# Patient Record
Sex: Male | Born: 1970
Health system: Southern US, Community
[De-identification: ages and names within clinical notes are randomized; demographics above are authoritative.]

## PROBLEM LIST (undated history)

## (undated) DIAGNOSIS — M539 Dorsopathy, unspecified: Secondary | ICD-10-CM

## (undated) DIAGNOSIS — R0989 Other specified symptoms and signs involving the circulatory and respiratory systems: Secondary | ICD-10-CM

## (undated) DIAGNOSIS — F419 Anxiety disorder, unspecified: Secondary | ICD-10-CM

## (undated) DIAGNOSIS — R0602 Shortness of breath: Secondary | ICD-10-CM

## (undated) DIAGNOSIS — R569 Unspecified convulsions: Secondary | ICD-10-CM

## (undated) DIAGNOSIS — I10 Essential (primary) hypertension: Secondary | ICD-10-CM

## (undated) DIAGNOSIS — T8859XA Other complications of anesthesia, initial encounter: Secondary | ICD-10-CM

## (undated) DIAGNOSIS — G43909 Migraine, unspecified, not intractable, without status migrainosus: Secondary | ICD-10-CM

## (undated) DIAGNOSIS — F332 Major depressive disorder, recurrent severe without psychotic features: Secondary | ICD-10-CM

## (undated) DIAGNOSIS — M47812 Spondylosis without myelopathy or radiculopathy, cervical region: Secondary | ICD-10-CM

## (undated) DIAGNOSIS — T4145XA Adverse effect of unspecified anesthetic, initial encounter: Secondary | ICD-10-CM

## (undated) DIAGNOSIS — Z9049 Acquired absence of other specified parts of digestive tract: Secondary | ICD-10-CM

## (undated) DIAGNOSIS — F32A Depression, unspecified: Secondary | ICD-10-CM

## (undated) DIAGNOSIS — F329 Major depressive disorder, single episode, unspecified: Secondary | ICD-10-CM

## (undated) DIAGNOSIS — K219 Gastro-esophageal reflux disease without esophagitis: Secondary | ICD-10-CM

## (undated) HISTORY — DX: Major depressive disorder, recurrent severe without psychotic features: F33.2

## (undated) HISTORY — DX: Migraine, unspecified, not intractable, without status migrainosus: G43.909

## (undated) HISTORY — DX: Spondylosis without myelopathy or radiculopathy, cervical region: M47.812

## (undated) HISTORY — DX: Gastro-esophageal reflux disease without esophagitis: K21.9

## (undated) HISTORY — DX: Essential (primary) hypertension: I10

## (undated) HISTORY — DX: Shortness of breath: R06.02

## (undated) HISTORY — PX: POSTERIOR FUSION CERVICAL SPINE: SUR628

## (undated) HISTORY — DX: Depression, unspecified: F32.A

## (undated) HISTORY — DX: Other specified symptoms and signs involving the circulatory and respiratory systems: R09.89

## (undated) HISTORY — DX: Unspecified convulsions: R56.9

## (undated) HISTORY — PX: CERVICAL DISC SURGERY: SHX588

## (undated) HISTORY — DX: Acquired absence of other specified parts of digestive tract: Z90.49

## (undated) HISTORY — DX: Anxiety disorder, unspecified: F41.9

## (undated) HISTORY — DX: Dorsopathy, unspecified: M53.9

## (undated) HISTORY — DX: Major depressive disorder, single episode, unspecified: F32.9

## (undated) HISTORY — PX: KNEE SURGERY: SHX244

---

## 1998-03-30 ENCOUNTER — Emergency Department (HOSPITAL_COMMUNITY): Admission: EM | Admit: 1998-03-30 | Discharge: 1998-03-30 | Payer: Self-pay | Admitting: Emergency Medicine

## 2004-05-02 ENCOUNTER — Other Ambulatory Visit (HOSPITAL_COMMUNITY): Admission: RE | Admit: 2004-05-02 | Discharge: 2004-07-31 | Payer: Self-pay | Admitting: Psychiatry

## 2004-05-02 ENCOUNTER — Ambulatory Visit: Payer: Self-pay | Admitting: Psychiatry

## 2005-09-12 ENCOUNTER — Ambulatory Visit (HOSPITAL_COMMUNITY): Payer: Self-pay | Admitting: Psychiatry

## 2005-09-13 ENCOUNTER — Ambulatory Visit: Payer: Self-pay | Admitting: Psychiatry

## 2005-09-13 ENCOUNTER — Other Ambulatory Visit (HOSPITAL_COMMUNITY): Admission: RE | Admit: 2005-09-13 | Discharge: 2005-10-13 | Payer: Self-pay | Admitting: Psychiatry

## 2007-11-28 ENCOUNTER — Encounter: Payer: Self-pay | Admitting: Cardiovascular Disease

## 2008-01-16 ENCOUNTER — Emergency Department: Payer: Self-pay | Admitting: Emergency Medicine

## 2008-01-16 ENCOUNTER — Emergency Department (HOSPITAL_COMMUNITY): Admission: EM | Admit: 2008-01-16 | Discharge: 2008-01-17 | Payer: Self-pay | Admitting: Emergency Medicine

## 2009-01-20 ENCOUNTER — Emergency Department (HOSPITAL_COMMUNITY): Admission: EM | Admit: 2009-01-20 | Discharge: 2009-01-20 | Payer: Self-pay | Admitting: Emergency Medicine

## 2009-02-03 ENCOUNTER — Emergency Department (HOSPITAL_COMMUNITY): Admission: EM | Admit: 2009-02-03 | Discharge: 2009-02-04 | Payer: Self-pay | Admitting: Emergency Medicine

## 2009-06-01 ENCOUNTER — Ambulatory Visit (HOSPITAL_BASED_OUTPATIENT_CLINIC_OR_DEPARTMENT_OTHER): Admission: RE | Admit: 2009-06-01 | Discharge: 2009-06-01 | Payer: Self-pay | Admitting: Nurse Practitioner

## 2009-06-04 ENCOUNTER — Ambulatory Visit: Payer: Self-pay | Admitting: Internal Medicine

## 2009-06-07 ENCOUNTER — Ambulatory Visit (HOSPITAL_COMMUNITY): Admission: RE | Admit: 2009-06-07 | Discharge: 2009-06-09 | Payer: Self-pay | Admitting: Neurosurgery

## 2009-06-27 ENCOUNTER — Encounter: Admission: RE | Admit: 2009-06-27 | Discharge: 2009-06-27 | Payer: Self-pay | Admitting: Neurosurgery

## 2009-08-21 ENCOUNTER — Ambulatory Visit (HOSPITAL_BASED_OUTPATIENT_CLINIC_OR_DEPARTMENT_OTHER): Admission: RE | Admit: 2009-08-21 | Discharge: 2009-08-21 | Payer: Self-pay | Admitting: Nurse Practitioner

## 2009-08-27 ENCOUNTER — Ambulatory Visit: Payer: Self-pay | Admitting: Internal Medicine

## 2010-01-05 ENCOUNTER — Encounter: Admission: RE | Admit: 2010-01-05 | Discharge: 2010-01-05 | Payer: Self-pay | Admitting: Internal Medicine

## 2010-01-12 ENCOUNTER — Encounter: Admission: RE | Admit: 2010-01-12 | Discharge: 2010-01-12 | Payer: Self-pay | Admitting: Internal Medicine

## 2010-02-14 ENCOUNTER — Encounter: Admission: RE | Admit: 2010-02-14 | Discharge: 2010-02-14 | Payer: Self-pay | Admitting: Neurosurgery

## 2010-03-23 ENCOUNTER — Ambulatory Visit: Payer: Self-pay | Admitting: Cardiovascular Disease

## 2010-03-23 ENCOUNTER — Encounter: Payer: Self-pay | Admitting: Cardiovascular Disease

## 2010-03-23 DIAGNOSIS — R079 Chest pain, unspecified: Secondary | ICD-10-CM | POA: Insufficient documentation

## 2010-04-02 HISTORY — PX: APPENDECTOMY: SHX54

## 2010-04-22 ENCOUNTER — Encounter: Payer: Self-pay | Admitting: Internal Medicine

## 2010-05-04 NOTE — Assessment & Plan Note (Signed)
Summary: NP6/AMD   Visit Type:  Initial Consult Primary Provider:  Vernia Buff.  CC:  c/o chest pain, shortness of breath, and feeling lightheaded and has numbness in left hand but thinks numbness is due to an accident.  Has shortness of breath with & without exertion daily.  He does not have chest pain daily.Thomas Cole  History of Present Illness: Thomas Cole is a very pleasant 40 year old gentleman with a long history of shortness of breath episodes, chest pain episodes with workup at outside facilities including [facility] including a stress echo, sleep studies, EKGs who presents after a recent visit to urgent care for similar symptoms. He has a long history of neck pathology, status post neck fusion from C4-C6 in March of 2011.  He reports having episodes of shortness of breath that sometimes come on with exertion and sometimes at rest. He noticed these symptoms back when he was working for the [employer] in 2009. He had a recent episode of shortness of breath with some chest discomfort while sitting on the couch. He is still able to exert himself and has done well on treadmills in the past. He is uncertain if his heart rate sinus tachycardia which has been noted during his visit to the urgent care and on previous evaluations is from pain or some other etiology.  He describes the chest pain as a heavy feeling, sometimes coming on with exertion. It happens sometimes once a week, sometimes less than that.  Stress echo in August 2009 was essentially normal with mild LVH, trivial MR and PR.  Notes indicate he does have obstructive sleep apnea and was recommended to wear CPAP but he does not wear this  EKG from March 13 2010 shows sinus tachycardia with rate of 138 beats per minute  EKG today shows normal sinus rhythm with rate 81 beats per minute, no significant ST or T wave changes  Preventive Screening-Counseling & Management  Caffeine-Diet-Exercise     Does Patient Exercise: yes      Drug  Use:  no.    Current Medications (verified): 1)  Dilantin 100 Mg Caps (Phenytoin Sodium Extended) 2)  Omeprazole 20 Mg Cpdr (Omeprazole) .... Takes Two Tablets Daily 3)  Lotensin 10 Mg Tabs (Benazepril Hcl)  Allergies (verified): 1)  ! Tegretol 2)  ! * Sulfar 3)  ! * Bee Stings  Past History:  Past Medical History: Last updated: 03/23/2010 Hypertension Migraine Headaches asthma back trouble anxiety GERD  Family History: Last updated: 03/23/2010 Father: CAD Mother: CAD, Hypertension  Social History: Last updated: 03/23/2010 Tobacco Use - No.  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  Risk Factors: Exercise: yes (03/23/2010)  Risk Factors: Smoking Status: never (03/23/2010)  Past Surgical History: bilateral knee surgery lower back surgery (ruptured disc) neck surgery (fused 4,5 & 6) June 07, 2009  Social History: Tobacco Use - No.  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 Drug Use:  no Does Patient Exercise:  yes  Review of Systems       The patient complains of chest pain and dyspnea on exertion.  The patient denies fever, weight loss, weight gain, vision loss,  decreased hearing, hoarseness, syncope, peripheral edema, prolonged cough, abdominal pain, incontinence, muscle weakness, depression, and enlarged lymph nodes.    Vital Signs:  Patient profile:   40 year old male Height:      74 inches Weight:      270 pounds BMI:     34.79 Pulse rate:   81 / minute BP sitting:   120 / 86  (left arm) Cuff size:   large  Vitals Entered By: Lysbeth Galas CMA (March 23, 2010 11:06 AM)  Physical Exam  General:  Well developed, well nourished, in no acute  distress. Head:  normocephalic and atraumatic Neck:  Neck supple, no JVD. No masses, thyromegaly or abnormal cervical nodes. Lungs:  Clear bilaterally to auscultation and percussion. Heart:  Non-displaced PMI, chest non-tender; regular rate and rhythm, S1, S2 without murmurs, rubs or gallops. Carotid upstroke normal, no bruit.  Pedals normal pulses. No edema, no varicosities. Abdomen:  Bowel sounds positive; abdomen soft and non-tender without masses,  Msk:  Back normal, normal gait. Muscle strength and tone normal. Pulses:  pulses normal in all 4 extremities Extremities:  No clubbing or cyanosis. Neurologic:  Alert and oriented x 3. Skin:  Intact without lesions or rashes. Psych:  Normal affect.   Impression & Recommendations:  Problem # 1:  CHEST PAIN (ICD-786.50) etiology of his chest pain and shortness of breath is uncertain.  I asked him to monitor his heart rate when he has these episodes. He may have an underlying atrial tachycardia causing his shortness of breath We have given him metoprolol tartrate to be taken p.r.n. for tachycardia  He does mention having some asthma symptoms. We have given him an albuterol inhaler to be taken p.r.n. for shortness of breath episodes. 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  We will also change his blood pressure pill 2 amlodipine 5 mg daily in an effort to manage his symptoms if they are from coronary spasm or esophageal spasm. We will also give him nitroglycerin sublingual to take p.r.n. for chest pain that is stabbing.  We discussed with him that no further stress testing is likely needed given his negative stress test in the past. We could consider a CT scan of the hardware of the chest if his symptoms persist. We could also try ranexa   The following medications were removed from the medication list:    Lotensin 10 Mg Tabs (Benazepril hcl) His updated medication list for this problem includes:     Nitrostat 0.4 Mg Subl (Nitroglycerin) .Thomas Cole... 1 tablet under tongue at onset of chest pain; you may repeat every 5 minutes for up to 3 doses.    Metoprolol Tartrate 25 Mg Tabs (Metoprolol tartrate) .Thomas Cole... Take one tablet by mouth twice a day as needed for tachycardia    Amlodipine Besylate 10 Mg Tabs (Amlodipine besylate) .Thomas Cole... Take one half tablet by mouth daily  Other Orders: EKG w/ Interpretation (93000)  Patient Instructions: 1)  Your physician recommends that you schedule a follow-up appointment in: 2 months 2)  Your physician has recommended you make the following change in your medication: STOP Benazepril. START Amlodipine 10mg  1/2 tablet once daily. START Nitroglycerin 0.4mg  SL as needed. START Albuterol as needed for shortness of breath. START Metoprolol Tartrate 25mg  once daily for fast heart beat. Prescriptions: AMLODIPINE BESYLATE 10 MG TABS (AMLODIPINE BESYLATE) Take one half tablet by mouth daily  #30 x 6   Entered by:   Lanny Hurst RN  Authorized by:   Dossie Arbour MD   Signed by:   Lanny Hurst RN on 03/23/2010   Method used:   Electronically to        [id number]       [address]       [place], [place]  [address]       Ph: [phone]       Fax: [phone]   RxID:   [id number] METOPROLOL TARTRATE 25 MG TABS (METOPROLOL TARTRATE) Take one tablet by mouth twice a day as needed for tachycardia  #60 x 1   Entered by:   Lanny Hurst RN   Authorized by:   Dossie Arbour MD   Signed by:   Lanny Hurst RN on 03/23/2010   Method used:   Electronically to        [id number] ([facility]       [address]       [place], [place]  [address]       Ph: [phone]       Fax: [phone]   RxID:   [id number] ALBUTEROL SULFATE (2.5 MG/3ML) 0.083% NEBU (ALBUTEROL SULFATE) Inhale 2 puffs every 4-6 hours as needed for shortness of breath  #1 x 0   Entered by:   Lanny Hurst RN   Authorized by:   Dossie Arbour MD   Signed by:   Lanny Hurst RN  on 03/23/2010   Method used:   Electronically to        [id number] ([facility]       [address]       [place], [place]  [address]       Ph: [phone]       Fax: [phone]   RxID:   [id number] NITROSTAT 0.4 MG SUBL (NITROGLYCERIN) 1 tablet under tongue at onset of chest pain; you may repeat every 5 minutes for up to 3 doses.  #25 x 0   Entered by:   Lanny Hurst RN   Authorized by:   Dossie Arbour MD   Signed by:   Lanny Hurst RN on 03/23/2010   Method used:   Electronically to        [id number] ([facility])       [address]       [place], [place]  [address]       Ph: [phone]       Fax: [phone]   RxID:   [id number]

## 2010-05-25 ENCOUNTER — Ambulatory Visit (INDEPENDENT_AMBULATORY_CARE_PROVIDER_SITE_OTHER): Payer: 59 | Admitting: Cardiovascular Disease

## 2010-05-25 ENCOUNTER — Encounter: Payer: Self-pay | Admitting: Cardiovascular Disease

## 2010-05-25 DIAGNOSIS — G4730 Sleep apnea, unspecified: Secondary | ICD-10-CM | POA: Insufficient documentation

## 2010-05-25 DIAGNOSIS — R0602 Shortness of breath: Secondary | ICD-10-CM

## 2010-05-25 DIAGNOSIS — I10 Essential (primary) hypertension: Secondary | ICD-10-CM

## 2010-05-25 DIAGNOSIS — G4733 Obstructive sleep apnea (adult) (pediatric): Secondary | ICD-10-CM

## 2010-05-30 NOTE — Assessment & Plan Note (Signed)
Summary: F/U 2 MONTHS/SAB   Visit Type:  Follow-up Primary Provider:  Vernia Buff.  CC:  2 month f/u. c/o SOB and occasional chest pains. 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  On his last clinic visit, we tried improving his blood pressure with the addition of amlodipine. We gave him an inhaler and metoprolol for possible tachycardia though he has not tried the latter very often. He continues to have shortness of breath with exertion. He feels his heart rate may be going too fast when he exerts himself.   He did try CPAP though return it to the company as he does have a problem with sleep walking and took himself off CPAP every night.  Stress echo in August 2009 was essentially normal with mild LVH, trivial MR and PR.  EKG today shows normal sinus rhythm with rate 76 beats per minute with no significant ST or T wave changes  Current Medications (verified): 1)  Dilantin 100 Mg Caps (Phenytoin Sodium Extended) 2)  Omeprazole 20 Mg Cpdr (Omeprazole) .... Takes Two Tablets Daily 3)  Nitrostat 0.4 Mg Subl (Nitroglycerin) .Marland Kitchen.. 1 Tablet Under Tongue At Onset of Chest Pain; You May Repeat Every 5 Minutes For Up To 3 Doses. 4)  Albuterol Sulfate (2.5 Mg/70ml) 0.083% Nebu (Albuterol Sulfate) .... Inhale 2 Puffs Every 4-6 Hours As Needed For Shortness of Breath 5)  Metoprolol Tartrate 25 Mg Tabs (Metoprolol Tartrate) .... Take One Tablet By Mouth Twice A Day As Needed For  Tachycardia 6)  Amlodipine Besylate 10 Mg Tabs (Amlodipine Besylate) .... Take One Half Tablet By Mouth Daily  Allergies (verified): 1)  ! Tegretol 2)  ! * Sulfar 3)  ! * Bee Stings  Past History:  Past Medical History: Last updated: 03/23/2010 Hypertension Migraine Headaches asthma back trouble anxiety GERD  Past Surgical History: Last updated: 03/23/2010 bilateral knee surgery lower back surgery (ruptured disc) neck surgery (fused 4,5 & 6) June 07, 2009  Family History: Last updated: 03/23/2010 Father: CAD Mother: CAD, Hypertension  Social History: Last updated: 03/23/2010 Tobacco Use - No.  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  Risk Factors: Exercise: yes (03/23/2010)  Risk Factors: Smoking Status: never (03/23/2010)  Review of Systems       The patient complains of chest pain and dyspnea on exertion.  The patient denies fever, weight loss, weight gain, vision loss, decreased hearing, hoarseness, syncope, peripheral edema, prolonged cough, abdominal pain, incontinence, muscle weakness, depression, and enlarged lymph nodes.    Vital Signs:  Patient profile:   40 year old male Height:      74 inches Weight:      271 pounds BMI:     34.92 Pulse rate:   76 / minute BP sitting:   140 / 102  (left arm) Cuff size:   large  Vitals Entered By: Huntley Dec  Burress CMA (May 25, 2010 10:12 AM)  Physical Exam  General:  Well developed, well nourished, in no acute distress. Head:  normocephalic and atraumatic Neck:  Neck supple, no JVD. No masses, thyromegaly or abnormal cervical nodes. Lungs:  Clear bilaterally to auscultation and percussion. Heart:  Non-displaced PMI, chest non-tender; regular rate and rhythm, S1, S2 without murmurs, rubs or gallops. Carotid upstroke normal, no bruit.  Pedals normal pulses. No  edema, no varicosities. Abdomen:  Bowel sounds positive; abdomen soft and non-tender without masses,  Msk:  Back normal, normal gait. Muscle strength and tone normal. Pulses:  pulses normal in all 4 extremities Extremities:  No clubbing or cyanosis. Neurologic:  Alert and oriented x 3. Skin:  Intact without lesions or rashes. Psych:  Normal affect.   Impression & Recommendations:  Problem # 1:  DYSPNEA (ICD-786.05) he continues to have symptoms of shortness of breath with exertion. He has had very thorough workup, most at [facility]. Stress test, echocardiogram/stress echo, PFTs, cardiopulmonary testing with the VO2 measurements, by report of these reports are not available to [place]. Everything was reportedly normal.  He is not wearing his CPAP and he does have significant sleep walking and daytime somnolence. We have asked him to meet with a sleep specialist for further management.  Wrist just he tried his metoprolol tartrate 25 mg b.i.d. for heart rate control and for improved for pressure control. I've asked him to monitor his blood pressure at home and contact our office if his diastolic continues to be greater than 90. We could change him to amlodipine 10 mg daily for blood pressure control if needed His updated medication list for this problem includes:    Metoprolol Tartrate 25 Mg Tabs (Metoprolol tartrate) .Marland Kitchen... Take one tablet by mouth twice a day    Amlodipine Besylate 10 Mg Tabs (Amlodipine besylate) .Marland Kitchen... Take one half tablet by mouth daily  Orders: EKG w/ Interpretation (93000)  Problem # 2:  CHEST PAIN (ICD-786.50) mild symptoms. Previous negative stress test.  His updated medication list for this problem includes:    Nitrostat 0.4 Mg Subl (Nitroglycerin) .Marland Kitchen... 1 tablet under tongue at onset of chest pain; you may repeat every 5 minutes for up to 3 doses.    Metoprolol Tartrate 25 Mg Tabs (Metoprolol tartrate) .Marland Kitchen... Take one tablet by mouth twice a day    Amlodipine  Besylate 10 Mg Tabs (Amlodipine besylate) .Marland Kitchen... Take one half tablet by mouth daily  Orders: EKG w/ Interpretation (93000)  Problem # 3:  SLEEP APNEA (ICD-780.57) This needs addressing. He needs to wear his CPAP and meet with a sleep specialist. We have suggested he talk with a [facility] affiliated sleep physician. If he has trouble tolerating CPAP because of sleepwalking, he may require surgical treatment.  Patient Instructions: 1)  Your physician recommends that you schedule a follow-up appointment in: as needed 2)  Your physician has recommended you make the following change in your medication: Start on Metoprolol Tart taking 25 mg two times a day. 3)  Monitor blood pressure & heart rate for two weeks and send [place] the readings to the office.

## 2010-06-01 ENCOUNTER — Encounter (INDEPENDENT_AMBULATORY_CARE_PROVIDER_SITE_OTHER): Payer: 59

## 2010-06-01 ENCOUNTER — Telehealth: Payer: Self-pay | Admitting: Cardiovascular Disease

## 2010-06-01 ENCOUNTER — Encounter: Payer: Self-pay | Admitting: Cardiovascular Disease

## 2010-06-01 DIAGNOSIS — R0602 Shortness of breath: Secondary | ICD-10-CM

## 2010-06-01 DIAGNOSIS — I10 Essential (primary) hypertension: Secondary | ICD-10-CM

## 2010-06-08 NOTE — Assessment & Plan Note (Addendum)
Summary: SOB/Diaphoretic/Elev BP /MES  Nurse Visit   Vital Signs:  Patient profile:   40 year old male Height:      74 inches Weight:      271 pounds BMI:     34.92 BP sitting:   130 / 92  (left arm) Cuff size:   large  Vitals Entered By: Bishop Dublin, CMA (June 01, 2010 2:02 PM) CC: c/o elevated blood pressure & shortness of breath. Comments Pt in for BP check and EKG. EKG shows NSR HR 81. Pt is not symptomatic right now, however, he c/o incr SOB, dizziness, diaphoresis without exertion. Pt's BP has continuously been elevated with his "lowest readings of 140/90 and highest averaging 150/110." In previous office note Dr. Mariah Milling mentioned possibly increasing Amlodipine to 10mg  once daily, pt is now taking 5mg  once daily.    Pt is concerned with his symptoms of SOB. He takes Albuterol as needed, and pt states this has not improved symptoms at all. He wore CPAP in past but took it off during sleep, he has appt this month 06/20/10 with [facility] for assessment.    Pt c/o chest pressure at times but it does not last very long and he has never taken NTG for this. Pt has had spinal fusion of C3-6 about 1 year ago and wonders if this could be a possible reason causing symptoms. Notified pt Dr. Mariah Milling will assess BP and need for any med changes and we will contact him for any addional testing needed for his symptoms.     Visit Type:  nurse visit  CC:  c/o elevated blood pressure & shortness of breath..   Past History:  Past Medical History: Last updated: 03/23/2010 Hypertension Migraine Headaches asthma back trouble anxiety GERD  Past Surgical History: Last updated: 03/23/2010 bilateral knee surgery lower back surgery (ruptured disc) neck surgery (fused 4,5 & 6) June 07, 2009  Family History: Last updated: 03/23/2010 Father: CAD Mother: CAD, Hypertension  Social History: Last updated: 03/23/2010 Tobacco Use - No.  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  Risk Factors: Exercise: yes (03/23/2010)  Risk Factors: Smoking Status: never (03/23/2010)   Current Medications (verified): 1)  Dilantin 100 Mg Caps (Phenytoin Sodium Extended) .... Take Two Caps in The A.m. & Three in The P.m. 2)  Omeprazole 20 Mg Cpdr (Omeprazole) .... Takes Two Tablets Daily 3)  Nitrostat 0.4 Mg Subl (Nitroglycerin) .Marland Kitchen.. 1 Tablet Under Tongue At Onset of Chest Pain; You May Repeat Every 5 Minutes For Up To 3 Doses. 4)  Albuterol Sulfate (2.5 Mg/81ml) 0.083% Nebu (Albuterol Sulfate) .... Inhale 2 Puffs Every 4-6 Hours As Needed For Shortness of Breath 5)  Metoprolol Tartrate 25 Mg Tabs (Metoprolol Tartrate) .... Take One Tablet By Mouth Twice A Day 6)  Amlodipine Besylate 10 Mg Tabs (Amlodipine Besylate) .... Take One Half Tablet By Mouth Daily  Allergies (verified): 1)  ! Tegretol 2)  ! * Sulfar 3)  ! * Bee Stings  Appended Document: SOB/Diaphoretic/Elev BP /MES Would increase amlodipine to 10 mg daily. Watch for leg edema. CPAP followm up may help with symptoms of SOB. Monitor BP and call us if BP continues to be elevated  Appended Document: SOB/Diaphoretic/Elev BP /MES Attempted to contact pt, LMOM TCB /MES  Appended Document: SOB/Diaphoretic/Elev BP /MES Spoke  to pt, notified of above recommendation. Pt will incr Amlodipine to 10mg  once daily. Pt will call with any side effects of leg edema, pt will monitor BP and notify if remains elevated. Pt will f/u with CPAP [facility] and will contact our office with any other problems and/or if symptoms worsen, will schedule f/u. Pt ok with this.   Clinical Lists Changes  Medications: Changed medication from AMLODIPINE BESYLATE 10 MG TABS (AMLODIPINE BESYLATE) Take one half tablet by mouth daily to AMLODIPINE BESYLATE 10 MG TABS (AMLODIPINE BESYLATE) Take one tablet by  mouth daily

## 2010-06-08 NOTE — Progress Notes (Signed)
Summary: Elevated BP  Phone Note Call from Patient Call back at Home Phone [phone] P PH     Caller: Self Call For: Gollan Summary of Call: Pt states that BP has been as high as 150/110 over the past few days.  Pt was shaking and sweaty this am.  BP 147/91 with a pulse 85 now. Initial call taken by: Harlon Flor,  June 01, 2010 10:18 AM  Follow-up for Phone Call        Attempted to call pt, LMOM TCB. Lanny Hurst RN  June 01, 2010 11:09 AM  Spoke to pt, he reports elevated BP as stated above and diaphoretic. Pt denies chest pain/ SOB. Advised pt to come in office for nurse visit. Nurse visit note to follow. Follow-up by: Lanny Hurst RN,  June 02, 2010 2:41 PM

## 2010-06-26 LAB — BASIC METABOLIC PANEL
CO2: 29 mEq/L (ref 19–32)
Calcium: 9.8 mg/dL (ref 8.4–10.5)
Creatinine, Ser: 0.88 mg/dL (ref 0.4–1.5)
GFR calc Af Amer: >60 mL/min (ref >60)
GFR calc non Af Amer: >60 mL/min (ref >60)
Sodium: 139 mEq/L (ref 135–145)

## 2010-06-26 LAB — CBC
Hemoglobin: 15.2 g/dL (ref 13.0–17.0)
MCHC: 33.9 g/dL (ref 30.0–36.0)
RBC: 4.94 MIL/uL (ref 4.22–5.81)
WBC: 10.6 10*3/uL — ABNORMAL HIGH (ref 4.0–10.5)

## 2010-06-26 LAB — SURGICAL PCR SCREEN
MRSA, PCR: NEGATIVE
Staphylococcus aureus: NEGATIVE

## 2010-06-27 ENCOUNTER — Telehealth: Payer: Self-pay | Admitting: Cardiovascular Disease

## 2010-06-27 NOTE — Telephone Encounter (Signed)
Pt was under the impression that the Amlodipine was going to be refilled.  [facility]  Please advise.

## 2010-06-28 ENCOUNTER — Other Ambulatory Visit: Payer: Self-pay

## 2010-06-28 ENCOUNTER — Other Ambulatory Visit: Payer: Self-pay | Admitting: Emergency Medicine

## 2010-06-28 MED ORDER — AMLODIPINE BESYLATE 10 MG PO TABS: 10.0000 mg | ORAL_TABLET | Freq: Every day | ORAL | Status: DC

## 2010-06-28 MED ORDER — METOPROLOL TARTRATE 25 MG PO TABS: 25.0000 mg | ORAL_TABLET | Freq: Two times a day (BID) | ORAL | Status: DC

## 2010-06-28 NOTE — Telephone Encounter (Signed)
Script called in to pharmacy  

## 2010-06-28 NOTE — Telephone Encounter (Signed)
Rx sent for metoprolol Tart 25 mg one twice a day.

## 2010-07-05 LAB — POCT I-STAT, CHEM 8
BUN: 14 mg/dL (ref 6–23)
Chloride: 104 mEq/L (ref 96–112)
HCT: 46 % (ref 39.0–52.0)
Potassium: 3.5 mEq/L (ref 3.5–5.1)
Sodium: 142 mEq/L (ref 135–145)

## 2010-07-05 LAB — GLUCOSE, CAPILLARY: Glucose-Capillary: 90 mg/dL (ref 70–99)

## 2010-07-06 LAB — GLUCOSE, CAPILLARY: Glucose-Capillary: 87 mg/dL (ref 70–99)

## 2010-07-06 LAB — POCT URINALYSIS DIP (DEVICE)
Glucose, UA: NEGATIVE mg/dL
Nitrite: NEGATIVE
Protein, ur: NEGATIVE mg/dL
Specific Gravity, Urine: 1.01 (ref 1.005–1.030)
Urobilinogen, UA: 0.2 mg/dL (ref 0.0–1.0)

## 2011-01-02 LAB — URINALYSIS, ROUTINE W REFLEX MICROSCOPIC
Glucose, UA: NEGATIVE
Hgb urine dipstick: NEGATIVE
Ketones, ur: NEGATIVE
Protein, ur: NEGATIVE
Urobilinogen, UA: 0.2

## 2011-01-02 LAB — BASIC METABOLIC PANEL
CO2: 25
Calcium: 9.9
Creatinine, Ser: 0.97
GFR calc Af Amer: >60
GFR calc non Af Amer: >60
Glucose, Bld: 90
Sodium: 138

## 2011-01-02 LAB — CBC
HCT: 44.8
MCHC: 33.8
MCV: 90.9
Platelets: 235
RBC: 4.93

## 2011-01-02 LAB — DIFFERENTIAL
Basophils Relative: 0
Eosinophils Absolute: 0.1
Eosinophils Relative: 0
Lymphs Abs: 3
Monocytes Relative: 9
Neutrophils Relative %: 69

## 2011-01-23 ENCOUNTER — Other Ambulatory Visit: Payer: Self-pay | Admitting: Cardiovascular Disease

## 2011-01-23 NOTE — Telephone Encounter (Signed)
Refill sent for amlodipine.  

## 2011-02-19 ENCOUNTER — Telehealth: Payer: Self-pay

## 2011-02-19 MED ORDER — METOPROLOL TARTRATE 25 MG PO TABS: 25.0000 mg | ORAL_TABLET | Freq: Two times a day (BID) | ORAL | Status: DC

## 2011-02-19 NOTE — Telephone Encounter (Signed)
Refill sent for metoprolol tart 25 mg take one tablet twice a day as needed for tachycardia.

## 2011-03-21 ENCOUNTER — Emergency Department (HOSPITAL_COMMUNITY)
Admission: EM | Admit: 2011-03-21 | Discharge: 2011-03-22 | Disposition: A | Discharge status: Home / Self Care | Payer: Self-pay

## 2011-04-09 DIAGNOSIS — Z9049 Acquired absence of other specified parts of digestive tract: Secondary | ICD-10-CM

## 2011-04-09 HISTORY — DX: Acquired absence of other specified parts of digestive tract: Z90.49

## 2011-08-24 ENCOUNTER — Other Ambulatory Visit: Payer: Self-pay | Admitting: Cardiovascular Disease

## 2011-09-05 ENCOUNTER — Other Ambulatory Visit: Payer: Self-pay | Admitting: Cardiovascular Disease

## 2011-09-06 ENCOUNTER — Telehealth: Payer: Self-pay

## 2011-09-06 MED ORDER — METOPROLOL TARTRATE 25 MG PO TABS: 25.0000 mg | ORAL_TABLET | Freq: Two times a day (BID) | ORAL | Status: DC

## 2011-09-06 NOTE — Telephone Encounter (Signed)
LMOM to have patient call to schedule an appointment with Dr. Gollan.  

## 2011-09-26 ENCOUNTER — Encounter: Payer: Self-pay | Admitting: Cardiovascular Disease

## 2011-09-26 ENCOUNTER — Ambulatory Visit (INDEPENDENT_AMBULATORY_CARE_PROVIDER_SITE_OTHER): Payer: Self-pay | Admitting: Cardiovascular Disease

## 2011-09-26 VITALS — BP 138/82 | HR 96 | Ht 74.0 in | Wt 271.5 lb

## 2011-09-26 DIAGNOSIS — R0602 Shortness of breath: Secondary | ICD-10-CM

## 2011-09-26 DIAGNOSIS — R079 Chest pain, unspecified: Secondary | ICD-10-CM

## 2011-09-26 DIAGNOSIS — G4730 Sleep apnea, unspecified: Secondary | ICD-10-CM

## 2011-09-26 MED ORDER — AMLODIPINE BESYLATE 10 MG PO TABS: 10.0000 mg | ORAL_TABLET | Freq: Every day | ORAL | Status: DC

## 2011-09-26 MED ORDER — OMEPRAZOLE 20 MG PO CPDR: 20.0000 mg | DELAYED_RELEASE_CAPSULE | Freq: Every day | ORAL | Status: DC

## 2011-09-26 MED ORDER — METOPROLOL TARTRATE 25 MG PO TABS: 25.0000 mg | ORAL_TABLET | Freq: Two times a day (BID) | ORAL | Status: DC

## 2011-09-26 MED ORDER — NITROGLYCERIN 0.4 MG SL SUBL: 0.4000 mg | SUBLINGUAL_TABLET | SUBLINGUAL | Status: DC | PRN

## 2011-09-26 NOTE — Progress Notes (Signed)
Patient ID: [name], male    DOB: 1970-09-10, 41 y.o.   MRN: [id number]  HPI Comments: Mr. 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   He continues to have periods of fast heartbeat, worse when he is hot. He has chronic pain in his neck in the C3-C6 region after previous fusion. He has had some visits with chronic pain. Currently he does not have insurance. He does have some numbness down his left hand. When his pain is significant, he has high blood pressure. He continues to have problems with sleep, sleep walking. He does not sleep for many hours in a row.   Stress echo in August 2009 was essentially normal with mild LVH, trivial MR and PR.   EKG today shows normal sinus rhythm with rate 81 beats per minute with no significant ST or T wave changes      Outpatient Encounter Prescriptions as of 09/26/2011  Medication Sig Dispense Refill  . albuterol (PROVENTIL) (2.5 MG/3ML) 0.083% nebulizer solution Take 2.5 mg by nebulization every 6 (six) hours as needed.        Marland Kitchen amLODipine (NORVASC) 10 MG tablet Take 1 tablet (10 mg total) by mouth daily.  30 tablet  12  . metoprolol tartrate (LOPRESSOR) 25 MG tablet Take 1 tablet (25 mg total) by mouth 2 (two) times daily.  60 tablet  12  . nitroGLYCERIN (NITROSTAT) 0.4 MG SL tablet Place 1 tablet (0.4 mg total) under the tongue every 5 (five) minutes as needed.  25 tablet  12  . omeprazole (PRILOSEC) 20 MG capsule Take 1 capsule (20 mg total) by mouth daily.  30 capsule  12  . phenytoin (DILANTIN) 100 MG ER capsule Take  100 mg by mouth 3 (three) times daily.         Review of Systems  Constitutional: Negative.   HENT: Negative.   Eyes: Negative.   Respiratory: Positive for shortness of breath.   Cardiovascular: Negative.   Gastrointestinal: Negative.   Musculoskeletal: Positive for back pain.  Skin: Negative.   Neurological: Negative.   Hematological: Negative.   Psychiatric/Behavioral: Positive for disturbed wake/sleep cycle.  All other systems reviewed and are negative.    BP 138/82  Pulse 96  Ht 6\' 2"  (1.88 m)  Wt 271 lb 8 oz (123.152 kg)  BMI 34.86 kg/m2  Physical Exam  Nursing note and vitals reviewed. Constitutional: He is oriented to person, place, and time. He appears well-developed and well-nourished.  HENT:  Head: Normocephalic.  Nose: Nose normal.  Mouth/Throat: Oropharynx is clear and moist.  Eyes: Conjunctivae are normal. Pupils are equal, round, and reactive to light.  Neck: Normal range of motion. Neck supple. No JVD present.  Cardiovascular: Normal rate, regular rhythm, S1 normal, S2 normal, normal heart sounds and intact distal pulses.  Exam reveals no gallop and no friction rub.   No murmur heard. Pulmonary/Chest: Effort normal and breath sounds normal. No respiratory distress. He has no wheezes. He has no rales. He exhibits no tenderness.  Abdominal: Soft. Bowel sounds are normal. He exhibits no distension. There is  no tenderness.  Musculoskeletal: Normal range of motion. He exhibits no edema and no tenderness.  Lymphadenopathy:    He has no cervical adenopathy.  Neurological: He is alert and oriented to person, place, and time. Coordination normal.  Skin: Skin is warm and dry. No rash noted. No erythema.  Psychiatric: He has a normal mood and affect. His behavior is normal. Judgment and thought content normal.           Assessment and Plan

## 2011-09-26 NOTE — Patient Instructions (Addendum)
You are doing well. No medication changes were made.  Please call us if you have new issues that need to be addressed before your next appt.  Your physician wants you to follow-up in: 12 months.  You will receive a reminder letter in the mail two months in advance. If you don't receive a letter, please call our office to schedule the follow-up appointment. 

## 2011-09-26 NOTE — Assessment & Plan Note (Signed)
Chronic shortness of breath. Mild symptoms, unchanged. Previous workup. No further workup at this time.

## 2011-09-26 NOTE — Assessment & Plan Note (Signed)
He is not particularly interested in wearing CPAP given history of sleep walking. He reports trying CPAP in the past and has taken it off frequently in the middle of the night. He reports waking up in a different room, waking up and having the front door unlocked. Other numerous examples.

## 2011-11-19 ENCOUNTER — Other Ambulatory Visit: Payer: Self-pay | Admitting: Cardiovascular Disease

## 2011-11-19 NOTE — Telephone Encounter (Signed)
Refilled Amlodipine.

## 2012-03-21 ENCOUNTER — Other Ambulatory Visit: Payer: Self-pay | Admitting: Neurological Surgery

## 2012-03-21 DIAGNOSIS — M4802 Spinal stenosis, cervical region: Secondary | ICD-10-CM

## 2012-03-21 DIAGNOSIS — M47812 Spondylosis without myelopathy or radiculopathy, cervical region: Secondary | ICD-10-CM

## 2012-03-27 ENCOUNTER — Ambulatory Visit
Admission: RE | Admit: 2012-03-27 | Discharge: 2012-03-27 | Disposition: A | Discharge status: Home / Self Care | Payer: No Typology Code available for payment source | Source: Ambulatory Visit | Attending: Neurological Surgery | Admitting: Neurological Surgery

## 2012-03-27 DIAGNOSIS — M4802 Spinal stenosis, cervical region: Secondary | ICD-10-CM

## 2012-03-27 DIAGNOSIS — M47812 Spondylosis without myelopathy or radiculopathy, cervical region: Secondary | ICD-10-CM

## 2012-06-19 ENCOUNTER — Other Ambulatory Visit: Payer: Self-pay | Admitting: Neurological Surgery

## 2012-07-15 ENCOUNTER — Other Ambulatory Visit: Payer: Self-pay | Admitting: *Deleted

## 2012-07-15 MED ORDER — OMEPRAZOLE 20 MG PO CPDR: 20.0000 mg | DELAYED_RELEASE_CAPSULE | Freq: Two times a day (BID) | ORAL | Status: DC

## 2012-07-15 NOTE — Telephone Encounter (Signed)
Refilled Omeprazole sent to [facility] pharmacy. 

## 2012-07-23 ENCOUNTER — Other Ambulatory Visit: Payer: Self-pay | Admitting: Neurological Surgery

## 2012-07-23 DIAGNOSIS — M47814 Spondylosis without myelopathy or radiculopathy, thoracic region: Secondary | ICD-10-CM

## 2012-07-28 ENCOUNTER — Ambulatory Visit (HOSPITAL_COMMUNITY)
Admission: RE | Admit: 2012-07-28 | Payer: No Typology Code available for payment source | Source: Ambulatory Visit | Admitting: Neurological Surgery

## 2012-07-28 ENCOUNTER — Encounter (HOSPITAL_COMMUNITY): Admission: RE | Payer: Self-pay | Source: Ambulatory Visit

## 2012-07-28 SURGERY — ANTERIOR CERVICAL CORPECTOMY
Anesthesia: General

## 2012-07-30 ENCOUNTER — Ambulatory Visit
Admission: RE | Admit: 2012-07-30 | Discharge: 2012-07-30 | Disposition: A | Discharge status: Home / Self Care | Payer: BC Managed Care – PPO | Source: Ambulatory Visit | Attending: Neurological Surgery | Admitting: Neurological Surgery

## 2012-07-30 DIAGNOSIS — M47814 Spondylosis without myelopathy or radiculopathy, thoracic region: Secondary | ICD-10-CM

## 2012-08-14 ENCOUNTER — Other Ambulatory Visit: Payer: Self-pay | Admitting: Neurosurgery

## 2012-08-14 ENCOUNTER — Other Ambulatory Visit: Payer: Self-pay | Admitting: Neurological Surgery

## 2012-08-23 ENCOUNTER — Other Ambulatory Visit: Payer: Self-pay | Admitting: Cardiovascular Disease

## 2012-08-26 ENCOUNTER — Other Ambulatory Visit: Payer: Self-pay | Admitting: *Deleted

## 2012-08-26 MED ORDER — METOPROLOL TARTRATE 25 MG PO TABS: 25.0000 mg | ORAL_TABLET | Freq: Two times a day (BID) | ORAL | Status: DC

## 2012-08-26 NOTE — Telephone Encounter (Signed)
Refilled Metoprolol sent to [facility] pharmacy.

## 2012-09-03 ENCOUNTER — Encounter (HOSPITAL_COMMUNITY): Payer: Self-pay

## 2012-09-03 ENCOUNTER — Encounter (HOSPITAL_COMMUNITY)
Admission: RE | Admit: 2012-09-03 | Discharge: 2012-09-03 | Disposition: A | Discharge status: Home / Self Care | Payer: BC Managed Care – PPO | Source: Ambulatory Visit | Attending: Neurological Surgery | Admitting: Neurological Surgery

## 2012-09-03 ENCOUNTER — Ambulatory Visit (HOSPITAL_COMMUNITY)
Admission: RE | Admit: 2012-09-03 | Discharge: 2012-09-03 | Disposition: A | Discharge status: Home / Self Care | Payer: BC Managed Care – PPO | Source: Ambulatory Visit | Attending: Anesthesiology | Admitting: Anesthesiology

## 2012-09-03 ENCOUNTER — Encounter (HOSPITAL_COMMUNITY): Payer: Self-pay | Admitting: Pharmacy Technician

## 2012-09-03 DIAGNOSIS — I10 Essential (primary) hypertension: Secondary | ICD-10-CM | POA: Insufficient documentation

## 2012-09-03 DIAGNOSIS — Z01812 Encounter for preprocedural laboratory examination: Secondary | ICD-10-CM | POA: Insufficient documentation

## 2012-09-03 DIAGNOSIS — Z01818 Encounter for other preprocedural examination: Secondary | ICD-10-CM | POA: Insufficient documentation

## 2012-09-03 DIAGNOSIS — R0602 Shortness of breath: Secondary | ICD-10-CM | POA: Insufficient documentation

## 2012-09-03 DIAGNOSIS — Z01810 Encounter for preprocedural cardiovascular examination: Secondary | ICD-10-CM | POA: Insufficient documentation

## 2012-09-03 HISTORY — DX: Unspecified convulsions: R56.9

## 2012-09-03 HISTORY — DX: Other complications of anesthesia, initial encounter: T88.59XA

## 2012-09-03 HISTORY — DX: Shortness of breath: R06.02

## 2012-09-03 HISTORY — DX: Adverse effect of unspecified anesthetic, initial encounter: T41.45XA

## 2012-09-03 LAB — BASIC METABOLIC PANEL
GFR calc Af Amer: >90 mL/min (ref >90)
GFR calc non Af Amer: >90 mL/min (ref >90)
Potassium: 4.1 mEq/L (ref 3.5–5.1)
Sodium: 140 mEq/L (ref 135–145)

## 2012-09-03 LAB — CBC
Hemoglobin: 15.4 g/dL (ref 13.0–17.0)
MCHC: 34.9 g/dL (ref 30.0–36.0)
RBC: 4.92 MIL/uL (ref 4.22–5.81)
WBC: 6.4 10*3/uL (ref 4.0–10.5)

## 2012-09-03 LAB — SURGICAL PCR SCREEN
MRSA, PCR: NEGATIVE
Staphylococcus aureus: NEGATIVE

## 2012-09-03 NOTE — Progress Notes (Signed)
Note from dr Mariah Milling in epic encounters 6/13 f/u 1 yr, sleep study i epic notes 5/11

## 2012-09-03 NOTE — Pre-Procedure Instructions (Addendum)
Thomas Cole  09/03/2012   Your procedure is scheduled on:  09/15/12  Report to [facility] at 1000 AM.  Call this number if you have problems the morning of surgery: [phone]   Remember:   Do not eat food or drink liquids after midnight.   Take these medicines the morning of surgery with A SIP OF WATER: inhaler, amlodipine, metoprolol, omeprazole, dilantin, nitro if needed   Do not wear jewelry, make-up or nail polish.  Do not wear lotions, powders, or perfumes. You may wear deodorant.  Do not shave 48 hours prior to surgery. Men may shave face and neck.  Do not bring valuables to the hospital.  [facility] is not responsible                   for any belongings or valuables.  Contacts, dentures or bridgework may not be worn into surgery.  Leave suitcase in the car. After surgery it may be brought to your room.  For patients admitted to the hospital, checkout time is 11:00 AM the day of  discharge.   Patients discharged the day of surgery will not be allowed to drive  home.  Name and phone number of your driver:   Special Instructions: Shower using CHG 2 nights before surgery and the night before surgery.  If you shower the day of surgery use CHG.  Use special wash - you have one bottle of CHG for all showers.  You should use approximately 1/3 of the bottle for each shower.   Please read over the following fact sheets that you were given: Pain Booklet, Coughing and Deep Breathing, MRSA Information and Surgical Site Infection Prevention

## 2012-09-03 NOTE — Pre-Procedure Instructions (Signed)
Thomas Cole  09/03/2012   Your procedure is scheduled on:  Monday, June 16th  Report to [facility] at 1000 AM. Come to main entrance "A" and go to east elevators up to 3rd floor. Check in at short stay desk.  Call this number if you have problems the morning of surgery: [phone]   Remember:   Do not eat food or drink liquids after midnight.   Take these medicines the morning of surgery with A SIP OF WATER: Norvasc, Lopressor, Prilosec, Dilantin, Albuterol if needed   Do not wear jewelry, make-up or nail polish.  Do not wear lotions, powders, or perfumes, deodorant.  Do not shave 48 hours prior to surgery. Men may shave face and neck.  Do not bring valuables to the hospital.  [facility] is not responsible for any belongings or valuables.  Contacts, dentures or bridgework may not be worn into surgery.  Leave suitcase in the car. After surgery it may be brought to your room.  For patients admitted to the hospital, checkout time is 11:00 AM the day of discharge.   Patients discharged the day of surgery will not be allowed to drive home.    Special Instructions: Shower using CHG 2 nights before surgery and the night before surgery.  If you shower the day of surgery use CHG.  Use special wash - you have one bottle of CHG for all showers.  You should use approximately 1/3 of the bottle for each shower.   Please read over the following fact sheets that you were given: Pain Booklet, Coughing and Deep Breathing, MRSA Information and Surgical Site Infection Prevention

## 2012-09-14 MED ORDER — CEFAZOLIN SODIUM 10 G IJ SOLR
3.0000 g | Freq: Once | INTRAMUSCULAR | Status: AC
  Administered 2012-09-15: 3 g via INTRAVENOUS
  Filled 2012-09-14 (×2): qty 3000

## 2012-09-15 ENCOUNTER — Encounter (HOSPITAL_COMMUNITY): Payer: Self-pay | Admitting: *Deleted

## 2012-09-15 ENCOUNTER — Observation Stay (HOSPITAL_COMMUNITY)
Admission: RE | Admit: 2012-09-15 | Discharge: 2012-09-17 | Disposition: A | Discharge status: Home / Self Care | Payer: BC Managed Care – PPO | Source: Ambulatory Visit | Attending: Neurological Surgery | Admitting: Neurological Surgery

## 2012-09-15 ENCOUNTER — Encounter (HOSPITAL_COMMUNITY): Payer: Self-pay | Admitting: Anesthesiology

## 2012-09-15 ENCOUNTER — Ambulatory Visit (HOSPITAL_COMMUNITY): Payer: BC Managed Care – PPO | Admitting: Anesthesiology

## 2012-09-15 ENCOUNTER — Ambulatory Visit (HOSPITAL_COMMUNITY): Payer: BC Managed Care – PPO

## 2012-09-15 ENCOUNTER — Encounter (HOSPITAL_COMMUNITY)
Admission: RE | Disposition: A | Discharge status: Home / Self Care | Payer: Self-pay | Source: Ambulatory Visit | Attending: Neurological Surgery

## 2012-09-15 DIAGNOSIS — M5000 Cervical disc disorder with myelopathy, unspecified cervical region: Secondary | ICD-10-CM | POA: Insufficient documentation

## 2012-09-15 DIAGNOSIS — M47812 Spondylosis without myelopathy or radiculopathy, cervical region: Secondary | ICD-10-CM

## 2012-09-15 DIAGNOSIS — Z981 Arthrodesis status: Secondary | ICD-10-CM | POA: Insufficient documentation

## 2012-09-15 DIAGNOSIS — M4712 Other spondylosis with myelopathy, cervical region: Principal | ICD-10-CM | POA: Diagnosis present

## 2012-09-15 DIAGNOSIS — I10 Essential (primary) hypertension: Secondary | ICD-10-CM | POA: Insufficient documentation

## 2012-09-15 DIAGNOSIS — Z79899 Other long term (current) drug therapy: Secondary | ICD-10-CM | POA: Insufficient documentation

## 2012-09-15 DIAGNOSIS — M488X2 Other specified spondylopathies, cervical region: Secondary | ICD-10-CM | POA: Insufficient documentation

## 2012-09-15 DIAGNOSIS — R339 Retention of urine, unspecified: Secondary | ICD-10-CM | POA: Insufficient documentation

## 2012-09-15 HISTORY — PX: ANTERIOR CERVICAL CORPECTOMY: SHX1159

## 2012-09-15 SURGERY — ANTERIOR CERVICAL CORPECTOMY
Anesthesia: General | Site: Spine Cervical | Wound class: Clean

## 2012-09-15 MED ORDER — CEFAZOLIN SODIUM 1-5 GM-% IV SOLN
1.0000 g | Freq: Three times a day (TID) | INTRAVENOUS | Status: AC
  Administered 2012-09-15 – 2012-09-16 (×2): 1 g via INTRAVENOUS
  Filled 2012-09-15 (×3): qty 50

## 2012-09-15 MED ORDER — MIDAZOLAM HCL 5 MG/5ML IJ SOLN
INTRAMUSCULAR | Status: DC | PRN
  Administered 2012-09-15: 2 mg via INTRAVENOUS

## 2012-09-15 MED ORDER — ROCURONIUM BROMIDE 100 MG/10ML IV SOLN
INTRAVENOUS | Status: DC | PRN
  Administered 2012-09-15: 50 mg via INTRAVENOUS

## 2012-09-15 MED ORDER — SENNA 8.6 MG PO TABS
1.0000 | ORAL_TABLET | Freq: Two times a day (BID) | ORAL | Status: DC
  Administered 2012-09-15 – 2012-09-16 (×3): 8.6 mg via ORAL
  Filled 2012-09-15 (×6): qty 1

## 2012-09-15 MED ORDER — ALUM & MAG HYDROXIDE-SIMETH 200-200-20 MG/5ML PO SUSP: 30.0000 mL | Freq: Four times a day (QID) | ORAL | Status: DC | PRN

## 2012-09-15 MED ORDER — METHOCARBAMOL 100 MG/ML IJ SOLN
500.0000 mg | Freq: Four times a day (QID) | INTRAVENOUS | Status: DC | PRN
  Filled 2012-09-15: qty 5

## 2012-09-15 MED ORDER — FENTANYL CITRATE 0.05 MG/ML IJ SOLN
INTRAMUSCULAR | Status: DC | PRN
  Administered 2012-09-15: 25 ug via INTRAVENOUS
  Administered 2012-09-15: 200 ug via INTRAVENOUS
  Administered 2012-09-15 (×5): 50 ug via INTRAVENOUS
  Administered 2012-09-15: 25 ug via INTRAVENOUS
  Administered 2012-09-15 (×5): 50 ug via INTRAVENOUS

## 2012-09-15 MED ORDER — THROMBIN 5000 UNITS EX SOLR
OROMUCOSAL | Status: DC | PRN
  Administered 2012-09-15: 15:00:00 via TOPICAL

## 2012-09-15 MED ORDER — GLYCOPYRROLATE 0.2 MG/ML IJ SOLN
INTRAMUSCULAR | Status: DC | PRN
  Administered 2012-09-15: .8 mg via INTRAVENOUS

## 2012-09-15 MED ORDER — LIDOCAINE-EPINEPHRINE 1 %-1:100000 IJ SOLN
INTRAMUSCULAR | Status: DC | PRN
  Administered 2012-09-15: 5 mL

## 2012-09-15 MED ORDER — SODIUM CHLORIDE 0.9 % IV SOLN: 250.0000 mL | INTRAVENOUS | Status: DC

## 2012-09-15 MED ORDER — DOCUSATE SODIUM 100 MG PO CAPS
100.0000 mg | ORAL_CAPSULE | Freq: Two times a day (BID) | ORAL | Status: DC
  Administered 2012-09-15 – 2012-09-16 (×3): 100 mg via ORAL
  Filled 2012-09-15 (×3): qty 1

## 2012-09-15 MED ORDER — OXYCODONE HCL 5 MG PO TABS: 5.0000 mg | ORAL_TABLET | Freq: Once | ORAL | Status: DC | PRN

## 2012-09-15 MED ORDER — SODIUM CHLORIDE 0.9 % IR SOLN
Status: DC | PRN
  Administered 2012-09-15: 14:00:00

## 2012-09-15 MED ORDER — PROPOFOL 10 MG/ML IV BOLUS
INTRAVENOUS | Status: DC | PRN
  Administered 2012-09-15: 50 mg via INTRAVENOUS
  Administered 2012-09-15: 30 mg via INTRAVENOUS
  Administered 2012-09-15: 280 mg via INTRAVENOUS

## 2012-09-15 MED ORDER — MENTHOL 3 MG MT LOZG: 1.0000 | LOZENGE | OROMUCOSAL | Status: DC | PRN

## 2012-09-15 MED ORDER — BACITRACIN 50000 UNITS IM SOLR
INTRAMUSCULAR | Status: AC
  Filled 2012-09-15: qty 1

## 2012-09-15 MED ORDER — OXYCODONE HCL 5 MG/5ML PO SOLN: 5.0000 mg | Freq: Once | ORAL | Status: DC | PRN

## 2012-09-15 MED ORDER — DEXAMETHASONE SODIUM PHOSPHATE 10 MG/ML IJ SOLN
INTRAMUSCULAR | Status: DC | PRN
  Administered 2012-09-15: 10 mg via INTRAVENOUS

## 2012-09-15 MED ORDER — BUPIVACAINE HCL (PF) 0.25 % IJ SOLN
INTRAMUSCULAR | Status: DC | PRN
  Administered 2012-09-15: 5 mL

## 2012-09-15 MED ORDER — 0.9 % SODIUM CHLORIDE (POUR BTL) OPTIME
TOPICAL | Status: DC | PRN
  Administered 2012-09-15: 1000 mL

## 2012-09-15 MED ORDER — PHENOL 1.4 % MT LIQD: 1.0000 | OROMUCOSAL | Status: DC | PRN

## 2012-09-15 MED ORDER — BISACODYL 10 MG RE SUPP: 10.0000 mg | Freq: Every day | RECTAL | Status: DC | PRN

## 2012-09-15 MED ORDER — METOPROLOL TARTRATE 25 MG PO TABS
25.0000 mg | ORAL_TABLET | Freq: Two times a day (BID) | ORAL | Status: DC
  Administered 2012-09-15 – 2012-09-16 (×3): 25 mg via ORAL
  Filled 2012-09-15 (×5): qty 1

## 2012-09-15 MED ORDER — PANTOPRAZOLE SODIUM 40 MG PO TBEC
40.0000 mg | DELAYED_RELEASE_TABLET | Freq: Every day | ORAL | Status: DC
  Administered 2012-09-15 – 2012-09-16 (×2): 40 mg via ORAL
  Filled 2012-09-15 (×2): qty 1

## 2012-09-15 MED ORDER — NITROGLYCERIN 0.4 MG SL SUBL
0.4000 mg | SUBLINGUAL_TABLET | SUBLINGUAL | Status: DC | PRN
Start: 2012-09-15 — End: 2012-09-17

## 2012-09-15 MED ORDER — ARTIFICIAL TEARS OP OINT
TOPICAL_OINTMENT | OPHTHALMIC | Status: DC | PRN
  Administered 2012-09-15: 1 via OPHTHALMIC

## 2012-09-15 MED ORDER — LACTATED RINGERS IV SOLN
INTRAVENOUS | Status: DC | PRN
  Administered 2012-09-15 (×3): via INTRAVENOUS

## 2012-09-15 MED ORDER — OXYCODONE-ACETAMINOPHEN 5-325 MG PO TABS
1.0000 | ORAL_TABLET | ORAL | Status: DC | PRN
  Administered 2012-09-15: 2 via ORAL
  Administered 2012-09-16: 1 via ORAL
  Administered 2012-09-16: 2 via ORAL
  Administered 2012-09-16: 1 via ORAL
  Administered 2012-09-17: 2 via ORAL
  Filled 2012-09-15: qty 1
  Filled 2012-09-15: qty 2
  Filled 2012-09-15: qty 1
  Filled 2012-09-15: qty 2
  Filled 2012-09-15 (×2): qty 1

## 2012-09-15 MED ORDER — METHOCARBAMOL 500 MG PO TABS
500.0000 mg | ORAL_TABLET | Freq: Four times a day (QID) | ORAL | Status: DC | PRN
  Administered 2012-09-15 – 2012-09-17 (×5): 500 mg via ORAL
  Filled 2012-09-15 (×4): qty 1

## 2012-09-15 MED ORDER — FLEET ENEMA 7-19 GM/118ML RE ENEM
1.0000 | ENEMA | Freq: Once | RECTAL | Status: AC | PRN
  Filled 2012-09-15: qty 1

## 2012-09-15 MED ORDER — ONDANSETRON HCL 4 MG/2ML IJ SOLN: 4.0000 mg | INTRAMUSCULAR | Status: DC | PRN

## 2012-09-15 MED ORDER — ONDANSETRON HCL 4 MG/2ML IJ SOLN
INTRAMUSCULAR | Status: DC | PRN
  Administered 2012-09-15: 4 mg via INTRAVENOUS

## 2012-09-15 MED ORDER — VECURONIUM BROMIDE 10 MG IV SOLR
INTRAVENOUS | Status: DC | PRN
  Administered 2012-09-15: 4 mg via INTRAVENOUS
  Administered 2012-09-15: 3 mg via INTRAVENOUS
  Administered 2012-09-15: 1 mg via INTRAVENOUS
  Administered 2012-09-15: 2 mg via INTRAVENOUS
  Administered 2012-09-15: 4 mg via INTRAVENOUS
  Administered 2012-09-15: 2 mg via INTRAVENOUS
  Administered 2012-09-15: 4 mg via INTRAVENOUS

## 2012-09-15 MED ORDER — POLYETHYLENE GLYCOL 3350 17 G PO PACK
17.0000 g | PACK | Freq: Every day | ORAL | Status: DC | PRN
  Filled 2012-09-15: qty 1

## 2012-09-15 MED ORDER — ALBUTEROL SULFATE (5 MG/ML) 0.5% IN NEBU: 2.5000 mg | INHALATION_SOLUTION | RESPIRATORY_TRACT | Status: DC | PRN

## 2012-09-15 MED ORDER — ACETAMINOPHEN 650 MG RE SUPP: 650.0000 mg | RECTAL | Status: DC | PRN

## 2012-09-15 MED ORDER — ALBUMIN HUMAN 5 % IV SOLN
INTRAVENOUS | Status: DC | PRN
  Administered 2012-09-15 (×2): via INTRAVENOUS

## 2012-09-15 MED ORDER — SODIUM CHLORIDE 0.9 % IJ SOLN: 3.0000 mL | INTRAMUSCULAR | Status: DC | PRN

## 2012-09-15 MED ORDER — THROMBIN 20000 UNITS EX SOLR
CUTANEOUS | Status: DC | PRN
  Administered 2012-09-15: 14:00:00 via TOPICAL

## 2012-09-15 MED ORDER — LACTATED RINGERS IV SOLN
INTRAVENOUS | Status: DC | PRN
  Administered 2012-09-15 (×3): via INTRAVENOUS

## 2012-09-15 MED ORDER — ACETAMINOPHEN 325 MG PO TABS: 650.0000 mg | ORAL_TABLET | ORAL | Status: DC | PRN

## 2012-09-15 MED ORDER — HYDROMORPHONE HCL PF 1 MG/ML IJ SOLN
INTRAMUSCULAR | Status: AC
  Filled 2012-09-15: qty 1

## 2012-09-15 MED ORDER — SODIUM CHLORIDE 0.9 % IJ SOLN
3.0000 mL | Freq: Two times a day (BID) | INTRAMUSCULAR | Status: DC
  Administered 2012-09-16 (×2): 3 mL via INTRAVENOUS

## 2012-09-15 MED ORDER — MORPHINE SULFATE 2 MG/ML IJ SOLN
1.0000 mg | INTRAMUSCULAR | Status: DC | PRN
  Administered 2012-09-15: 2 mg via INTRAVENOUS
  Administered 2012-09-15 – 2012-09-16 (×2): 4 mg via INTRAVENOUS
  Filled 2012-09-15: qty 1
  Filled 2012-09-15 (×2): qty 2

## 2012-09-15 MED ORDER — AMLODIPINE BESYLATE 10 MG PO TABS
10.0000 mg | ORAL_TABLET | Freq: Every day | ORAL | Status: DC
  Administered 2012-09-16: 10 mg via ORAL
  Filled 2012-09-15 (×3): qty 1

## 2012-09-15 MED ORDER — LIDOCAINE HCL (CARDIAC) 20 MG/ML IV SOLN
INTRAVENOUS | Status: DC | PRN
  Administered 2012-09-15: 60 mg via INTRAVENOUS

## 2012-09-15 MED ORDER — METHOCARBAMOL 500 MG PO TABS
ORAL_TABLET | ORAL | Status: AC
  Filled 2012-09-15: qty 1

## 2012-09-15 MED ORDER — PHENYTOIN SODIUM EXTENDED 100 MG PO CAPS
200.0000 mg | ORAL_CAPSULE | Freq: Two times a day (BID) | ORAL | Status: DC
  Administered 2012-09-15: 300 mg via ORAL
  Filled 2012-09-15 (×3): qty 3

## 2012-09-15 MED ORDER — SODIUM CHLORIDE 0.9 % IV SOLN
INTRAVENOUS | Status: AC
  Filled 2012-09-15: qty 500

## 2012-09-15 MED ORDER — HYDROMORPHONE HCL PF 1 MG/ML IJ SOLN
0.2500 mg | INTRAMUSCULAR | Status: DC | PRN
  Administered 2012-09-15 (×2): 0.5 mg via INTRAVENOUS

## 2012-09-15 MED ORDER — NEOSTIGMINE METHYLSULFATE 1 MG/ML IJ SOLN
INTRAMUSCULAR | Status: DC | PRN
  Administered 2012-09-15: 5 mg via INTRAVENOUS

## 2012-09-15 SURGICAL SUPPLY — 63 items
BAG DECANTER FOR FLEXI CONT (MISCELLANEOUS) ×2 IMPLANT
BANDAGE GAUZE ELAST BULKY 4 IN (GAUZE/BANDAGES/DRESSINGS) ×4 IMPLANT
BIT DRILL 14MM (INSTRUMENTS) ×1 IMPLANT
BIT DRILL NEURO 2X3.1 SFT TUCH (MISCELLANEOUS) ×2 IMPLANT
BLADE STRYKER (BLADE) ×2 IMPLANT
BUR BARREL STRAIGHT FLUTE 4.0 (BURR) ×2 IMPLANT
CANISTER SUCTION 2500CC (MISCELLANEOUS) ×2 IMPLANT
CLOTH BEACON ORANGE TIMEOUT ST (SAFETY) ×2 IMPLANT
CONT SPEC 4OZ CLIKSEAL STRL BL (MISCELLANEOUS) ×2 IMPLANT
DECANTER SPIKE VIAL GLASS SM (MISCELLANEOUS) ×2 IMPLANT
DERMABOND ADHESIVE PROPEN (GAUZE/BANDAGES/DRESSINGS) ×1
DERMABOND ADVANCED (GAUZE/BANDAGES/DRESSINGS)
DERMABOND ADVANCED .7 DNX12 (GAUZE/BANDAGES/DRESSINGS) IMPLANT
DERMABOND ADVANCED .7 DNX6 (GAUZE/BANDAGES/DRESSINGS) ×1 IMPLANT
DRAPE LAPAROTOMY 100X72 PEDS (DRAPES) ×2 IMPLANT
DRAPE MICROSCOPE LEICA (MISCELLANEOUS) IMPLANT
DRAPE POUCH INSTRU U-SHP 10X18 (DRAPES) ×2 IMPLANT
DRESSING TELFA 8X3 (GAUZE/BANDAGES/DRESSINGS) IMPLANT
DRILL 14MM (INSTRUMENTS) ×2
DRILL NEURO 2X3.1 SOFT TOUCH (MISCELLANEOUS) ×4
DRSG OPSITE 4X5.5 SM (GAUZE/BANDAGES/DRESSINGS) IMPLANT
DURAPREP 6ML APPLICATOR 50/CS (WOUND CARE) ×2 IMPLANT
ELECT REM PT RETURN 9FT ADLT (ELECTROSURGICAL) ×2
ELECTRODE REM PT RTRN 9FT ADLT (ELECTROSURGICAL) ×1 IMPLANT
GAUZE SPONGE 4X4 16PLY XRAY LF (GAUZE/BANDAGES/DRESSINGS) ×2 IMPLANT
GLOVE BIO SURGEON STRL SZ7.5 (GLOVE) IMPLANT
GLOVE BIOGEL PI IND STRL 7.5 (GLOVE) IMPLANT
GLOVE BIOGEL PI IND STRL 8 (GLOVE) ×2 IMPLANT
GLOVE BIOGEL PI IND STRL 8.5 (GLOVE) ×1 IMPLANT
GLOVE BIOGEL PI INDICATOR 7.5 (GLOVE)
GLOVE BIOGEL PI INDICATOR 8 (GLOVE) ×2
GLOVE BIOGEL PI INDICATOR 8.5 (GLOVE) ×1
GLOVE ECLIPSE 7.5 STRL STRAW (GLOVE) ×8 IMPLANT
GLOVE ECLIPSE 8.5 STRL (GLOVE) ×4 IMPLANT
GLOVE EXAM NITRILE LRG STRL (GLOVE) IMPLANT
GLOVE EXAM NITRILE MD LF STRL (GLOVE) IMPLANT
GLOVE EXAM NITRILE XL STR (GLOVE) IMPLANT
GLOVE EXAM NITRILE XS STR PU (GLOVE) IMPLANT
GOWN BRE IMP SLV AUR LG STRL (GOWN DISPOSABLE) IMPLANT
GOWN BRE IMP SLV AUR XL STRL (GOWN DISPOSABLE) ×2 IMPLANT
GOWN STRL REIN 2XL LVL4 (GOWN DISPOSABLE) ×4 IMPLANT
HEAD HALTER (SOFTGOODS) ×2 IMPLANT
HEMOSTAT POWDER SURGIFOAM 1G (HEMOSTASIS) ×2 IMPLANT
KIT BASIN OR (CUSTOM PROCEDURE TRAY) ×2 IMPLANT
KIT ROOM TURNOVER OR (KITS) ×2 IMPLANT
NEEDLE HYPO 22GX1.5 SAFETY (NEEDLE) ×2 IMPLANT
NEEDLE SPNL 22GX3.5 QUINCKE BK (NEEDLE) ×4 IMPLANT
NS IRRIG 1000ML POUR BTL (IV SOLUTION) ×2 IMPLANT
PACK LAMINECTOMY NEURO (CUSTOM PROCEDURE TRAY) ×2 IMPLANT
PAD ARMBOARD 7.5X6 YLW CONV (MISCELLANEOUS) ×6 IMPLANT
PATTIES SURGICAL 1X1 (DISPOSABLE) ×2 IMPLANT
PLATE 80MM (Plate) ×2 IMPLANT
RUBBERBAND STERILE (MISCELLANEOUS) IMPLANT
SCREW 14MM (Screw) ×16 IMPLANT
SPONGE INTESTINAL PEANUT (DISPOSABLE) ×2 IMPLANT
SPONGE SURGIFOAM ABS GEL 100 (HEMOSTASIS) ×2 IMPLANT
STRIP ILIUM TRICORT 2.2CMX60MM (Neuro Prosthesis/Implant) ×2 IMPLANT
SUT VIC AB 3-0 SH 8-18 (SUTURE) ×4 IMPLANT
SYR 20ML ECCENTRIC (SYRINGE) ×2 IMPLANT
TOWEL OR 17X24 6PK STRL BLUE (TOWEL DISPOSABLE) ×2 IMPLANT
TOWEL OR 17X26 10 PK STRL BLUE (TOWEL DISPOSABLE) ×2 IMPLANT
TRAP SPECIMEN MUCOUS 40CC (MISCELLANEOUS) ×2 IMPLANT
WATER STERILE IRR 1000ML POUR (IV SOLUTION) ×2 IMPLANT

## 2012-09-15 NOTE — Anesthesia Preprocedure Evaluation (Signed)
Anesthesia Evaluation  Patient identified by MRN, date of birth, ID band Patient awake    Reviewed: Allergy & Precautions, H&P , NPO status , Patient's Chart, lab work & pertinent test results  Airway Mallampati: II TM Distance: >3 FB Neck ROM: Full    Dental   Pulmonary shortness of breath, asthma , sleep apnea ,  breath sounds clear to auscultation        Cardiovascular hypertension, Rhythm:Regular Rate:Normal     Neuro/Psych  Headaches, Seizures -,  Anxiety    GI/Hepatic GERD-  ,  Endo/Other    Renal/GU      Musculoskeletal   Abdominal (+) + obese,   Peds  Hematology   Anesthesia Other Findings   Reproductive/Obstetrics                           Anesthesia Physical Anesthesia Plan  ASA: III  Anesthesia Plan: General   Post-op Pain Management:    Induction: Intravenous  Airway Management Planned: Oral ETT  Additional Equipment:   Intra-op Plan:   Post-operative Plan: Extubation in OR  Informed Consent: I have reviewed the patients History and Physical, chart, labs and discussed the procedure including the risks, benefits and alternatives for the proposed anesthesia with the patient or authorized representative who has indicated his/her understanding and acceptance.     Plan Discussed with: CRNA and Surgeon  Anesthesia Plan Comments:         Anesthesia Quick Evaluation

## 2012-09-15 NOTE — Preoperative (Signed)
Beta Blockers   Reason not to administer Beta Blockers:Lopressor taken 09/15/12

## 2012-09-15 NOTE — H&P (Signed)
CHIEF COMPLAINT:   Chronic symptoms with dysesthesias and numbness in the hands, stiffness, weakness and sensitivity to heat.    HISTORY OF PRESENT ILLNESS:  Thomas Cole is a 42 year old right-handed individual who tells me that a little over three years ago he had an incident where he slipped on the ice, fell back and hurt his neck.  He started developing numbness and tingling his arms and was subsequently seen by an orthopedist and ultimately referred to Dr. Aliene Beams.  An MRI of the cervical spine was performed which demonstrated evidence of cord compression secondary to degenerated and ruptured discs at C4-5 and 5-6.  He underwent anterior cervical decompression and arthrodesis of these two areas.  The patient notes he was slow to recover and he had continued symptoms with dysesthesias into the cervical spinal region and both arms and even his legs.  He notes that since that time he has developed some hypersensitivity to heat as if he works in the heat he will gradually develop weakness of his arms and legs, slurring of his speech and even slowing of his thought processes.  He furthermore notes that even grabbing something hot will tend to cause a reflex spasm in his muscles of his arms and he even gets muscle spasms in the muscles of his legs.  This process has been ongoing and he has been trying to work through it.  He used to work as a Emergency planning/management officer.  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  He finds that he has generalized diffuse weakness in the arms even on good weather days.  His concern is that he is still having substantial difficulty with constant pain in his neck and all the other issues combined to make life somewhat challenging.    PAST MEDICAL HISTORY:  His general health has been good.  He does have some high blood pressure.  He did have some back surgery at age 64 in the lumbar spine which was a diskectomy.  He had some  knee surgery in 2008.  He had an appendectomy about a year ago.    SOCIAL HISTORY:    He does not smoke or use alcohol.  Height and weight have been stable at 266 pounds and 6'2" in height.   ALLERGIES:    HE NOTES ALLERGIES TO SULFA AND TEGRETOL EACH OF WHICH CAUSES A RASH.  REVIEW OF SYSTEMS:   Notable for difficulty with his balance, nose bleeds, nasal congestion, drainage, high blood pressure, shortness of breath, neck pain, joint pain, leg pain, arm pain, back pain, arm weakness and leg weakness.  He has also noticed some difficulty with his memory, difficulty with speech and inability to concentrate, difficulty with coordination of his arms and legs all noted on a 14-point Review sheet.    CURRENT MEDICATIONS:  He is currently taking Dilantin 500 mg. (200 in the morning and 300 in the evening), Metoprolol 25 mg. in the morning and evening for blood pressure, Amlodipine 10 mg. q.a.m. and Omeprazole 20 mg. in the morning and evening.    PHYSICAL EXAMINATION:  On physical examination, I note that the patient stands straight and erect.  Gait, however, reveals moderate scissoring of his lower extremities.  He does have good strength in the major groups including the iliopsoas, quadriceps, tibialis anterior and gastrocs.  His upper extremity strength reveals he has good prominal strength in the deltoids and biceps but his triceps,  wrist extensors, and intrinsics are all downgraded to 4/5.  He is slightly weaker on the left than the right.  Reflexes are brisk and hyperreflexic in the biceps at 3+, triceps 3+, the brachioradialis demonstrates spread into the fingers, the patellae reflexes are 3+ as are the Achilles reflexes. Hoffmann's test is positive.    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  I did find in his records a report of a postop MRI that was done just a few months after his anterior  cervical decompression. This demonstrated significant persistent stenosis in the midcervical spine.  The concern I have is whether this condition has gradually healed itself over and remediated itself or whether there is a new process of continued stenosis.    New MRIs of the cervical thoracic spine demonstrate the patient has continued stenosis at C3-4 and C4-5 there is no evidence of a syrinx. He has some spondylitic changes at C6-C7 also. The worst problem however is at C3-4 and C4-5. He will require decompression via corpectomy and stabilization from C3-C5. Previous hardware will be removed. He 6 C7 will be decompressed and stabilized also.

## 2012-09-15 NOTE — Transfer of Care (Signed)
Immediate Anesthesia Transfer of Care Note  Patient: Thomas Cole  Procedure(s) Performed: Procedure(s) with comments: Cervical Three-Four,Cervical Six-Seven Anterior cervical decompression/diskectomy fusion with Cervical Four Corpectomy (N/A) - Cervical Three-Four,Cervical Six-Seven  Anterior cervical decompression/diskectomy fusion with Cervical four Corpectomy  Patient Location: PACU  Anesthesia Type:General  Level of Consciousness: oriented and sedated  Airway & Oxygen Therapy: Patient Spontanous Breathing and Patient connected to face mask oxygen  Post-op Assessment: Report given to PACU RN, Post -op Vital signs reviewed and stable and Patient moving all extremities X 4  Post vital signs: Reviewed and stable  Complications: No apparent anesthesia complications

## 2012-09-15 NOTE — Anesthesia Postprocedure Evaluation (Signed)
  Anesthesia Post-op Note  Patient: Thomas Cole  Procedure(s) Performed: Procedure(s) with comments: Cervical Three-Four,Cervical Six-Seven Anterior cervical decompression/diskectomy fusion with Cervical Four Corpectomy (N/A) - Cervical Three-Four,Cervical Six-Seven  Anterior cervical decompression/diskectomy fusion with Cervical four Corpectomy  Patient Location: PACU  Anesthesia Type:General  Level of Consciousness: awake  Airway and Oxygen Therapy: Patient Spontanous Breathing  Post-op Pain: mild  Post-op Assessment: Post-op Vital signs reviewed, Patient's Cardiovascular Status Stable, Respiratory Function Stable, Patent Airway, No signs of Nausea or vomiting and Pain level controlled  Post-op Vital Signs: stable  Complications: No apparent anesthesia complications

## 2012-09-15 NOTE — Op Note (Addendum)
Date of surgery: 09/15/2012 Preoperative diagnosis: Cervical spondylosis with myelopathy status post anterior cervical decompression arthrodesis C4-5 C5-6 Postoperative diagnosis: Cervical spondylosis with myelopathy C3-C7 status post arthrodesis C4-5 C5-6, ossification of posterior longitudinal ligament C3 C4 Procedure: Anterior cervical decompression C3-C4 with partial corpectomy of C4 anterior decompression C6-C7 and arthrodesis with allograft C3 to C5 and C6-C7. Removal of old plate [id number], placement of 80 mm trestle plate [id number]  Surgeon: Barnett Abu First assistant: Lelon Perla Anesthesia: Gen. endotracheal Indications: Patient is a 42 year old individual who has had a previous C-spine injury with cervical myelopathy. A recent followup study demonstrated that the patient had significant spondylitic disease at C3-C4 in addition to continued cord compression from ossification of the posterior longitudinal ligament behind the body of C4. He had evidence of spondylosis and significant flattening of the cord and by foraminal stenosis at C6-C7. He's been advised regarding the need for surgical decompression of C3-C4 and C6-C7. A partial corpectomy is required to decompress ossification of posterior longitudinal ligament behind the vertebral body of C4.  Procedure: Patient was brought to the operating room supine on a stretcher after the smooth induction of general endotracheal anesthesia his head was placed in 5 pounds of traction and the neck was prepped with alcohol and DuraPrep. Transverse incision was created in the left side of the neck. Dissection was carried down through the platysma to expose the sternocleidomastoid and the medial border of the sternocleidomastoid was dissected inferiorly and cephalad. The plane between the sternocleidomastoid and strap muscles was then created to reach the prevertebral space. The esophagus was then dissected gently from the ventral aspect of the vertebral bodies.  The previous plate was identified. The plate was skeletonized and 6 locking screws were removed. Examination of the arthrodesis in this covered that there were peek spacers however across each level no motion could be instilled identifying that there was a solid arthrodesis at C4-5 and C5-6. Then dissection was carried cephalad to expose C3-C4 were large ventral osteophyte had grown up. The osteophyte was removed. The disc space was uncovered. Caspar type retractors were then placed behind the soft tissues to expose the disc space. A complete discectomy at C3-C4 was performed. A partial corpectomy was then performed from the left side exposing and removing the ventral osteophyte that was causing canal compromise this was taken down to about the level of the C5 vertebrae. Greater than 75 percent of the vertebral body was remove. The posterior longitudinal ligament was noted be fairly hemorrhagic in this area hemostasis was achieved with some pledgets of Gelfoam soaked in thrombin and cottonoids. Once an adequate decompression was obtained, a tricortical allograft was cut to the appropriate length and fit in the defect between C3 and the bottom of the vertebral body of C4. Attention was then turned to C6-C7.  At C6-C7 and ventral osteophytes were again removed and the disc space was entered. Significantly degenerated disc material was removed from the interspace at C6-C7. Ultimately identified the dura and decompressed this from the center to the left side and then the right side. Large lateral osteophytes were also removed. When the decompression was adequate we performed another piece of tricortical allograft was cut to approximately the 8 mm height and placed into the interspace. At this point traction was removed. The neck was placed in slight flexion. A plate was measured it was felt that an 80 mm trestle plate would fit over the defect from C3-C7. This was fastened with 8 locking 4 x 14 mm screws.  A final  radiograph was identified confirming good position of the grafts at C3-C5 and good placement of the plate the screws and the superior portion down to about the level of C6 the screws and hardware cannot be seen at [facility]. C7. With this the wound was urinated copiously with antibiotic irrigating solution and soft tissue hemostasis was obtained meticulously. The platysma was then closed with 3-0 Vicryl and 3-0 Vicryl was used in the subcuticular tissue. Dermabond was placed on the skin. Blood loss for the procedure was estimated at about 250 cc.

## 2012-09-15 NOTE — Progress Notes (Signed)
Patient ID: Thomas Cole, male   DOB: March 02, 1971, 42 y.o.   MRN: [id number] Vital signs are stable motor function appears intact. Incision is clean and dry.  Patient feels somewhat numb he tingling sensation in hands but is able to move them well. Lower extremities are moving. Stable postop

## 2012-09-16 ENCOUNTER — Encounter (HOSPITAL_COMMUNITY): Payer: Self-pay | Admitting: Neurological Surgery

## 2012-09-16 MED ORDER — PHENYTOIN SODIUM EXTENDED 100 MG PO CAPS
200.0000 mg | ORAL_CAPSULE | Freq: Every day | ORAL | Status: DC
  Administered 2012-09-16: 200 mg via ORAL
  Filled 2012-09-16 (×2): qty 2

## 2012-09-16 MED ORDER — TAMSULOSIN HCL 0.4 MG PO CAPS
0.4000 mg | ORAL_CAPSULE | Freq: Every day | ORAL | Status: DC
  Administered 2012-09-16: 0.4 mg via ORAL
  Filled 2012-09-16 (×2): qty 1

## 2012-09-16 MED ORDER — DEXAMETHASONE SODIUM PHOSPHATE 4 MG/ML IJ SOLN
8.0000 mg | Freq: Once | INTRAMUSCULAR | Status: AC
  Administered 2012-09-16: 8 mg via INTRAVENOUS
  Filled 2012-09-16: qty 2

## 2012-09-16 MED ORDER — PHENYTOIN SODIUM EXTENDED 100 MG PO CAPS
300.0000 mg | ORAL_CAPSULE | Freq: Every day | ORAL | Status: DC
  Administered 2012-09-16: 300 mg via ORAL
  Filled 2012-09-16 (×2): qty 3

## 2012-09-16 NOTE — Progress Notes (Signed)
Occupational Therapy Evaluation Patient Details Name: Thomas Cole MRN: [id number] DOB: [birth year] Today's Date: 09/16/2012 Time: 9629-5284 OT Time Calculation (min): 21 min  OT Assessment / Plan / Recommendation Clinical Impression  Patient s/p ACDF and presents with decreased ADL independence and safety. Will benefit from acute OT to facilitate safe d/c home. OT will follow acutely.    OT Assessment  Patient needs continued OT Services    Follow Up Recommendations  No OT follow up;Supervision - Intermittent    Barriers to Discharge None    Equipment Recommendations  3 in 1 bedside comode    Recommendations for Other Services    Frequency  Min 2X/week    Precautions / Restrictions Precautions Precautions: Cervical Precaution Comments: reviewed all cervical precautions as they relate to ADLs Restrictions Weight Bearing Restrictions: No   Pertinent Vitals/Pain     ADL  Eating/Feeding: Independent Where Assessed - Eating/Feeding: Bed level Grooming: Independent Where Assessed - Grooming: Supported sitting Lower Body Bathing: Simulated;Minimal assistance Where Assessed - Lower Body Bathing: Supported sit to stand Lower Body Dressing: Simulated;Minimal assistance Where Assessed - Lower Body Dressing: Supported sit to stand Transfers/Ambulation Related to ADLs: Patient declined EOB/OOB due to sleepiness ADL Comments: Reviewed cervical precautions as they relate to ADLs.    OT Diagnosis: Generalized weakness  OT Problem List: Decreased strength;Decreased knowledge of use of DME or AE;Decreased knowledge of precautions OT Treatment Interventions: Self-care/ADL training;DME and/or AE instruction;Patient/family education   OT Goals Acute Rehab OT Goals OT Goal Formulation: With patient Time For Goal Achievement: 09/30/12 Potential to Achieve Goals: Good ADL Goals Pt Will Perform Upper Body Bathing: with modified independence ADL Goal: Upper Body Bathing - Progress: Goal  set today Pt Will Perform Lower Body Bathing: with modified independence ADL Goal: Lower Body Bathing - Progress: Goal set today Pt Will Perform Upper Body Dressing: with modified independence ADL Goal: Upper Body Dressing - Progress: Goal set today Pt Will Perform Lower Body Dressing: with modified independence ADL Goal: Lower Body Dressing - Progress: Goal set today Pt Will Transfer to Toilet: with modified independence ADL Goal: Toilet Transfer - Progress: Goal set today Pt Will Perform Toileting - Clothing Manipulation: with modified independence ADL Goal: Toileting - Clothing Manipulation - Progress: Goal set today Pt Will Perform Toileting - Hygiene: with modified independence ADL Goal: Toileting - Hygiene - Progress: Goal set today Pt Will Perform Tub/Shower Transfer: with supervision ADL Goal: Tub/Shower Transfer - Progress: Goal set today  Visit Information  Last OT Received On: 09/16/12 Assistance Needed: +1    Subjective Data  Subjective: "I'm so sleepy" Patient Stated Goal: to go home   Prior Functioning     Home Living Lives With: Alone Available Help at Discharge: Friend(s);Available PRN/intermittently Type of Home: House Home Access: Stairs to enter [employer] of Steps: 2 Entrance Stairs-Rails: None Home Layout: Two level;Full bath on main level;Able to live on main level with bedroom/bathroom Bathroom Shower/Tub: Engineer, manufacturing systems: Standard Home Adaptive Equipment: Programmer, systems - four wheeled Prior Function Level of Independence: Independent Able to Take Stairs?: Yes Driving: Yes Communication Communication: No difficulties         Vision/Perception     Cognition  Cognition Arousal/Alertness: Lethargic Behavior During Therapy: WFL for tasks assessed/performed Overall Cognitive Status: Within Functional Limits for tasks assessed    Extremity/Trunk Assessment Right Upper Extremity Assessment RUE  ROM/Strength/Tone: WFL for tasks assessed RUE Sensation: Deficits RUE Sensation Deficits: reports numbness and tingling in hand Left Upper Extremity  Assessment LUE ROM/Strength/Tone: WFL for tasks assessed LUE Sensation: WFL - Light Touch Right Lower Extremity Assessment RLE ROM/Strength/Tone: WFL for tasks assessed RLE Coordination: WFL - gross/fine motor Left Lower Extremity Assessment LLE ROM/Strength/Tone: WFL for tasks assessed LLE Coordination: WFL - gross/fine motor Trunk Assessment Trunk Assessment: Normal     Mobility Bed Mobility Bed Mobility: Rolling Right;Rolling Left;Right Sidelying to Sit;Sit to Sidelying Right Rolling Right: 4: Min assist Rolling Left: 5: Supervision Right Sidelying to Sit: 4: Min assist;HOB elevated (20 degrees) Sit to Sidelying Right: 5: Supervision;HOB flat Details for Bed Mobility Assistance: VCs for correct technique Transfers Sit to Stand: 5: Supervision;From bed;With upper extremity assist Stand to Sit: 5: Supervision;To bed;With upper extremity assist Details for Transfer Assistance: VCs for hand placement     Exercise     Balance Balance Balance Assessed: Yes Static Standing Balance Static Standing - Balance Support: No upper extremity supported;During functional activity Static Standing - Level of Assistance: 5: Stand by assistance;Other (comment) (wide BOS when using restroom)   End of Session OT - End of Session Activity Tolerance: Patient limited by fatigue Patient left: in bed;with call bell/phone within reach;with family/visitor present  GO Functional Limitation: Self care Self Care Current Status (Z6109): At least 20 percent but less than 40 percent impaired, limited or restricted Self Care Goal Status (U0454): At least 1 percent but less than 20 percent impaired, limited or restricted   Thomas Cole A 09/16/2012, 10:31 AM

## 2012-09-16 NOTE — Progress Notes (Signed)
Subjective: Patient reports c/o shoulder pain . Constant voiding  Objective: Vital signs in last 24 hours: Temp:  [97.4 F (36.3 C)-99.5 F (37.5 C)] 97.5 F (36.4 C) (06/17 1218) Pulse Rate:  [70-111] 87 (06/17 1218) Resp:  [11-18] 18 (06/17 1218) BP: (125-159)/(59-86) 129/72 mmHg (06/17 1218) SpO2:  [90 %-99 %] 93 % (06/17 1218)  Intake/Output from previous day: 06/16 0701 - 06/17 0700 In: 4900 [I.V.:4400; IV Piggyback:500] Out: 700 [Urine:350; Blood:350] Intake/Output this shift: Total I/O In: 720 [P.O.:720] Out: 600 [Urine:600]  incisions clean and dry. Bladder scan shows 600 cc retained urine post void.  Lab Results: No results found for this basename: WBC, HGB, HCT, PLT,  in the last 72 hours BMET No results found for this basename: NA, K, CL, CO2, GLUCOSE, BUN, CREATININE, CALCIUM,  in the last 72 hours  Studies/Results: Dg Cervical Spine 1 View  09/15/2012   *RADIOLOGY REPORT*  Clinical Data: Cervical spine surgery  DG CERVICAL SPINE - 1 VIEW  Comparison: Portable exam 1600 hours compared to MRI cervical spine 03/27/2012  Findings: Portable cross-table lateral view #1 at 1600 hours: Anterior plate and screws identified at C3 to at least C6. Inferior extent of plate obscured by patient's shoulders. Examination is limited by the severe degree of osseous demineralization. Disc prostheses are noted at C4-C5 and C5-C6. Anterior support tubes identified. No gross evidence of subluxation.  IMPRESSION: Anterior cervical spine fusion from C3 through at least C6.   Original Report Authenticated By: Ulyses Southward, M.D.    Assessment/Plan: Stable, urinary retention   LOS: 1 day  start Flomax, mobilize, repeat decadron.   Defne Gerling J 09/16/2012, 2:53 PM

## 2012-09-16 NOTE — Evaluation (Signed)
Physical Therapy Evaluation Patient Details Name: Thomas Cole MRN: [id number] DOB: Oct 02, 1970 Today's Date: 09/16/2012 Time: 4098-1191 PT Time Calculation (min): 25 min  PT Assessment / Plan / Recommendation Clinical Impression  Patient is s/p C3-7 ACDF surgery resulting in the deficits listed below (PT Problem List). Patient will benefit from skilled PT to increase their independence and safety with mobility (while adhering to their precautions) to allow discharge home with intermittent supervision.     PT Assessment  Patient needs continued PT services    Follow Up Recommendations  No PT follow up;Supervision - Intermittent    Does the patient have the potential to tolerate intense rehabilitation      Barriers to Discharge None      Equipment Recommendations  Rolling [place] with 5" wheels;Other (comment) (may need RW depending on progress)    Recommendations for Other Services     Frequency Min 5X/week    Precautions / Restrictions Precautions Precautions: Cervical Restrictions Weight Bearing Restrictions: No   Pertinent Vitals/Pain Back 8/10, RN provided pain medication      Mobility  Bed Mobility Bed Mobility: Rolling Right;Rolling Left;Right Sidelying to Sit;Sit to Sidelying Right Rolling Right: 4: Min assist Rolling Left: 5: Supervision Right Sidelying to Sit: 4: Min assist;HOB elevated (20 degrees) Sit to Sidelying Right: 5: Supervision;HOB flat Details for Bed Mobility Assistance: VCs for correct technique Transfers Transfers: Sit to Stand;Stand to Sit Sit to Stand: 5: Supervision;From bed;With upper extremity assist Stand to Sit: 5: Supervision;To bed;With upper extremity assist Details for Transfer Assistance: VCs for hand placement Ambulation/Gait Ambulation/Gait Assistance: 4: Min guard Ambulation Distance (Feet): 50 Feet Assistive device: Rolling [place] Gait Pattern: Step-to pattern Gait velocity: decreased 2/2 pain Stairs: No Wheelchair  Mobility Wheelchair Mobility: No    Exercises     PT Diagnosis: Difficulty walking;Acute pain  PT Problem List: Decreased activity tolerance;Decreased balance;Decreased mobility;Decreased knowledge of use of DME;Decreased knowledge of precautions;Pain PT Treatment Interventions: DME instruction;Gait training;Stair training;Functional mobility training;Therapeutic activities;Therapeutic exercise;Balance training;Patient/family education   PT Goals Acute Rehab PT Goals PT Goal Formulation: With patient Time For Goal Achievement: 09/23/12 Potential to Achieve Goals: Good Pt will Roll Supine to Right Side: Independently PT Goal: Rolling Supine to Right Side - Progress: Goal set today Pt will Roll Supine to Left Side: Independently PT Goal: Rolling Supine to Left Side - Progress: Goal set today Pt will go Supine/Side to Sit: Independently;with HOB 0 degrees PT Goal: Supine/Side to Sit - Progress: Goal set today Pt will go Sit to Supine/Side: Independently;with HOB 0 degrees PT Goal: Sit to Supine/Side - Progress: Goal set today Pt will go Sit to Stand: with modified independence;with upper extremity assist PT Goal: Sit to Stand - Progress: Goal set today Pt will go Stand to Sit: with modified independence;with upper extremity assist PT Goal: Stand to Sit - Progress: Goal set today Pt will Ambulate: 51 - 150 feet;with modified independence;with rolling [place] PT Goal: Ambulate - Progress: Goal set today Pt will Go Up / Down Stairs: 1-2 stairs;with least restrictive assistive device;with min assist PT Goal: Up/Down Stairs - Progress: Goal set today  Visit Information  Last PT Received On: 09/16/12 Assistance Needed: +1    Subjective Data  Subjective: "I just hurt." Patient Stated Goal: to go home   Prior Functioning  Home Living Lives With: Alone Available Help at Discharge: Friend(s);Available PRN/intermittently Type of Home: House Home Access: Stairs to enter [employer] of Steps: 2 Entrance Stairs-Rails: None Home Layout: Two level;Full bath  on main level;Able to live on main level with bedroom/bathroom Home Adaptive Equipment: [place] - rolling;[place] - four wheeled (belongs to friend, unsure of size) Prior Function Level of Independence: Independent Able to Take Stairs?: Yes Driving: Yes Communication Communication: No difficulties    Cognition  Cognition Arousal/Alertness: Awake/alert Behavior During Therapy: WFL for tasks assessed/performed Overall Cognitive Status: Within Functional Limits for tasks assessed    Extremity/Trunk Assessment Right Lower Extremity Assessment RLE ROM/Strength/Tone: WFL for tasks assessed RLE Coordination: WFL - gross/fine motor Left Lower Extremity Assessment LLE ROM/Strength/Tone: WFL for tasks assessed LLE Coordination: WFL - gross/fine motor Trunk Assessment Trunk Assessment: Normal   Balance Balance Balance Assessed: Yes Static Standing Balance Static Standing - Balance Support: No upper extremity supported;During functional activity Static Standing - Level of Assistance: 5: Stand by assistance;Other (comment) (wide BOS when using restroom)  End of Session PT - End of Session Equipment Utilized During Treatment: Gait belt Activity Tolerance: Patient tolerated treatment well;Patient limited by pain Patient left: in bed;with call bell/phone within reach;with family/visitor present Nurse Communication: Mobility status  GP Functional Assessment Tool Used: clinical judgment Functional Limitation: Mobility: Walking and moving around Mobility: Walking and Moving Around Current Status (Z6109): At least 20 percent but less than 40 percent impaired, limited or restricted Mobility: Walking and Moving Around Goal Status [phone]): At least 1 percent but less than 20 percent impaired, limited or restricted   Timoteo Gaul 09/16/2012, 8:33 AM   Nicki Reaper. Feltis, PT, DPT [phone]

## 2012-09-17 DIAGNOSIS — M4712 Other spondylosis with myelopathy, cervical region: Secondary | ICD-10-CM | POA: Diagnosis present

## 2012-09-17 DIAGNOSIS — M47812 Spondylosis without myelopathy or radiculopathy, cervical region: Secondary | ICD-10-CM

## 2012-09-17 HISTORY — DX: Spondylosis without myelopathy or radiculopathy, cervical region: M47.812

## 2012-09-17 MED ORDER — OXYCODONE-ACETAMINOPHEN 5-325 MG PO TABS: 1.0000 | ORAL_TABLET | ORAL | Status: DC | PRN

## 2012-09-17 MED ORDER — METHOCARBAMOL 500 MG PO TABS: 500.0000 mg | ORAL_TABLET | Freq: Four times a day (QID) | ORAL | Status: DC | PRN

## 2012-09-17 MED ORDER — DEXAMETHASONE 1 MG PO TABS: ORAL_TABLET | ORAL | Status: DC

## 2012-09-17 NOTE — Progress Notes (Signed)
Pt given D/C instructions with Rx's, verbal understanding given. All questions were answered. Pt D/C'd home via wheelchair @ 1130 per MD order. Rema Fendt, RN

## 2012-09-17 NOTE — Discharge Summary (Signed)
Physician Discharge Summary  Patient ID: Thomas Cole MRN: [id number] DOB/AGE: [birth year] 42 y.o.  Admit date: 09/15/2012 Discharge date: 09/17/2012  Admission Diagnoses: Cervical spondylosis with myelopathy C3-C4 and C6-C7, status post arthrodesis C4-C6 with ossification the posterior longitudinal ligament behind the body of C4  Discharge Diagnoses: Cervical spondylosis with myelopathy C3-C4 C6-C7, status post arthrodesis C4-C6 with ossification of the posterior longitudinal ligament behind the vertebral body of C4. Urinary retention Principal Problem:   Cervical spondylosis without myelopathy   Discharged Condition: fair  Hospital Course: Patient was admitted to undergo surgical decompression at C3-C4 with a partial corpectomy of C4. He also underwent anterior decompression at C6-C7 and removal of previously placed plate. He underwent allograft arthrodesis at C3-C4 and C6-C7 with strut graft placement across corpectomy of C4. He tolerated the surgery well however he had some significant postoperative shoulder pain he also had urinary retention postoperatively that was treated with Flomax and intermittent catheterizations. At the time of discharge she is ambulatory.  Consults: None  Significant Diagnostic Studies: MRI  Treatments: Corpectomy C4 anterior discectomy C3-C4 arthrodesis with structural allograft C3-C4 across corpectomy defect anterior decompression and arthrodesis C6-C7 removal of previously placed hardware C4-C6 anterior fixation C3-C7  Discharge Exam: Blood pressure 137/89, pulse 106, temperature 98.4 F (36.9 C), temperature source Oral, resp. rate 18, SpO2 93.00%. Incision is clean and dry motor function is good in the upper extremities with no focal defects in deltoids biceps triceps grips are intrinsics.  Disposition: 01-Home or Self Care  Discharge Orders   Future Orders Complete By Expires     Call MD for:  redness, tenderness, or signs of infection (pain,  swelling, redness, odor or green/yellow discharge around incision site)  As directed     Call MD for:  severe uncontrolled pain  As directed     Call MD for:  temperature >100.4  As directed     Diet - low sodium heart healthy  As directed     Discharge instructions  As directed     Comments:      Okay to shower. Do not apply salves or appointments to incision. No heavy lifting with the upper extremities greater than 15 pounds. May resume driving when not requiring pain medication and patient feels comfortable with doing so.    Increase activity slowly  As directed         Medication List    TAKE these medications       albuterol (2.5 MG/3ML) 0.083% nebulizer solution  Commonly known as:  PROVENTIL  Take 2.5 mg by nebulization every 6 (six) hours as needed for wheezing or shortness of breath.     amLODipine 10 MG tablet  Commonly known as:  NORVASC  Take 1 tablet (10 mg total) by mouth daily.     dexamethasone 1 MG tablet  Commonly known as:  DECADRON  2 tablets twice daily for 2 days, one tablet twice daily for 2 days, one tablet daily for 2 days.     methocarbamol 500 MG tablet  Commonly known as:  ROBAXIN  Take 1 tablet (500 mg total) by mouth every 6 (six) hours as needed (muscle spasm).     metoprolol tartrate 25 MG tablet  Commonly known as:  LOPRESSOR  Take 1 tablet (25 mg total) by mouth 2 (two) times daily.     nitroGLYCERIN 0.4 MG SL tablet  Commonly known as:  NITROSTAT  Place 1 tablet (0.4 mg total) under the tongue every 5 (five) minutes  as needed.     omeprazole 20 MG capsule  Commonly known as:  PRILOSEC  Take 1 capsule (20 mg total) by mouth 2 (two) times daily.     oxyCODONE-acetaminophen 5-325 MG per tablet  Commonly known as:  PERCOCET/ROXICET  Take 1-2 tablets by mouth every 4 (four) hours as needed for pain.     phenytoin 100 MG ER capsule  Commonly known as:  DILANTIN  Take 200-300 mg by mouth 2 (two) times daily. Take 200mg  in the morning and  take 300mg  at bedtime         Signed: Stefani Dama 09/17/2012, 8:45 AM

## 2012-09-17 NOTE — Progress Notes (Signed)
Physical Therapy Treatment Patient Details Name: Thomas Cole MRN: [id number] DOB: May 20, 1970 Today's Date: 09/17/2012 Time: 7829-5621 PT Time Calculation (min): 17 min  PT Assessment / Plan / Recommendation Comments on Treatment Session  Pt making steady progress with mobility.  Increased ambulation distance & performed stair training today.  Pt reports pain as moderate at incisional site + across shoulders.      Follow Up Recommendations  No PT follow up;Supervision - Intermittent     Does the patient have the potential to tolerate intense rehabilitation     Barriers to Discharge        Equipment Recommendations  Rolling [place] with 5" wheels;Other (comment)    Recommendations for Other Services    Frequency Min 5X/week   Plan Discharge plan remains appropriate;Frequency remains appropriate    Precautions / Restrictions Precautions Precautions: Cervical Precaution Comments: reviewed all cervical precautions as they relate to ADLs Restrictions Weight Bearing Restrictions: No   Pertinent Vitals/Pain Pt c/o pain at incisional site & across shoulders.  RN made aware.      Mobility  Bed Mobility Bed Mobility: Rolling Right;Right Sidelying to Sit;Sitting - Scoot to Edge of Bed Rolling Right: 5: Supervision Right Sidelying to Sit: 5: Supervision;HOB flat Sitting - Scoot to Edge of Bed: 5: Supervision Details for Bed Mobility Assistance: Increased time.  Pt demonstrated proper technique.  Transfers Transfers: Sit to Stand;Stand to Sit Sit to Stand: 5: Supervision;With upper extremity assist;From bed Stand to Sit: 5: Supervision;With armrests;To chair/3-in-1;With upper extremity assist Details for Transfer Assistance: Cues to reinforce hand placement Ambulation/Gait Ambulation/Gait Assistance: 5: Supervision Ambulation Distance (Feet): 130 Feet Assistive device: Rolling [place] Ambulation/Gait Assistance Details: Cues to relax UE's.  Pt with stiff posture.   Gait Pattern:  Step-through pattern;Decreased stride length;Decreased trunk rotation Gait velocity: decreased 2/2 pain Stairs: Yes Stairs Assistance: 4: Min assist Stairs Assistance Details (indicate cue type and reason): (A) provided via HHA for stability with ascending/descending.  Cues for technique.   Stair Management Technique: No rails;Step to pattern;Forwards Number of Stairs: 2 Wheelchair Mobility Wheelchair Mobility: No      PT Goals Acute Rehab PT Goals Time For Goal Achievement: 09/23/12 Potential to Achieve Goals: Good Pt will Roll Supine to Right Side: Independently PT Goal: Rolling Supine to Right Side - Progress: Progressing toward goal Pt will Roll Supine to Left Side: Independently Pt will go Supine/Side to Sit: Independently;with HOB 0 degrees PT Goal: Supine/Side to Sit - Progress: Progressing toward goal Pt will go Sit to Supine/Side: Independently;with HOB 0 degrees Pt will go Sit to Stand: with modified independence;with upper extremity assist PT Goal: Sit to Stand - Progress: Progressing toward goal Pt will go Stand to Sit: with modified independence;with upper extremity assist PT Goal: Stand to Sit - Progress: Progressing toward goal Pt will Ambulate: 51 - 150 feet;with modified independence;with rolling [place] PT Goal: Ambulate - Progress: Progressing toward goal Pt will Go Up / Down Stairs: 1-2 stairs;with least restrictive assistive device;with min assist PT Goal: Up/Down Stairs - Progress: Progressing toward goal  Visit Information  Last PT Received On: 09/17/12 Assistance Needed: +1    Subjective Data      Cognition  Cognition Arousal/Alertness: Awake/alert Behavior During Therapy: WFL for tasks assessed/performed Overall Cognitive Status: Within Functional Limits for tasks assessed    Balance     End of Session PT - End of Session Equipment Utilized During Treatment: Gait belt Activity Tolerance: Patient tolerated treatment well;Patient limited by  pain Patient left: in  chair;with call bell/phone within reach Nurse Communication: Mobility status;Patient requests pain meds     Verdell Face, [place] [phone] 09/17/2012

## 2012-10-01 ENCOUNTER — Other Ambulatory Visit: Payer: Self-pay | Admitting: *Deleted

## 2012-10-01 MED ORDER — OMEPRAZOLE 20 MG PO CPDR: 20.0000 mg | DELAYED_RELEASE_CAPSULE | Freq: Two times a day (BID) | ORAL | Status: DC

## 2012-10-01 MED ORDER — AMLODIPINE BESYLATE 10 MG PO TABS: 10.0000 mg | ORAL_TABLET | Freq: Every day | ORAL | Status: DC

## 2012-10-01 MED ORDER — METOPROLOL TARTRATE 25 MG PO TABS: 25.0000 mg | ORAL_TABLET | Freq: Two times a day (BID) | ORAL | Status: DC

## 2012-10-01 NOTE — Telephone Encounter (Signed)
Lmtcb patient needs to schedule a 1 yr f/u appointment.  Refilled Omeprazole, Metoprolol and amlodipine sent to [facility] refill.

## 2012-10-02 ENCOUNTER — Other Ambulatory Visit: Payer: Self-pay

## 2012-10-02 MED ORDER — AMLODIPINE BESYLATE 10 MG PO TABS: 10.0000 mg | ORAL_TABLET | Freq: Every day | ORAL | Status: DC

## 2012-10-02 NOTE — Telephone Encounter (Signed)
Refill sent for norvasc.  

## 2012-10-07 ENCOUNTER — Other Ambulatory Visit: Payer: Self-pay | Admitting: *Deleted

## 2012-10-07 MED ORDER — AMLODIPINE BESYLATE 10 MG PO TABS: 10.0000 mg | ORAL_TABLET | Freq: Every day | ORAL | Status: DC

## 2012-10-07 NOTE — Telephone Encounter (Signed)
Refilled Amlodipine sent to [facility] pharmacy. 

## 2012-10-29 ENCOUNTER — Encounter: Payer: Self-pay | Admitting: Cardiovascular Disease

## 2012-10-29 ENCOUNTER — Ambulatory Visit (INDEPENDENT_AMBULATORY_CARE_PROVIDER_SITE_OTHER): Payer: BC Managed Care – PPO | Admitting: Cardiovascular Disease

## 2012-10-29 VITALS — BP 124/90 | HR 85 | Ht 74.0 in | Wt 267.8 lb

## 2012-10-29 DIAGNOSIS — R079 Chest pain, unspecified: Secondary | ICD-10-CM

## 2012-10-29 DIAGNOSIS — R0602 Shortness of breath: Secondary | ICD-10-CM

## 2012-10-29 DIAGNOSIS — G4730 Sleep apnea, unspecified: Secondary | ICD-10-CM

## 2012-10-29 NOTE — Assessment & Plan Note (Signed)
Does not report having any significant chest pain on today's visit. Has been treadmill yesterday heart rate up to 120 beats per minute for 25 minutes

## 2012-10-29 NOTE — Progress Notes (Signed)
Patient ID: [name], male    DOB: 1970-11-15, 42 y.o.   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  He presents today after recent neck fusion from C3-C7, anterior approach on the left.  He reports that after the surgery, he had rapid improvement of his shortness of breath. Symptoms were very good for 2 weeks. He is walking on a treadmill starting yesterday for 25 minutes, peak heart rate 120 beats per minute. Overall felt well. He is hoping that the neck surgery will improve his symptoms of shortness of breath and chest discomfort. He has had weight loss through his surgical recovery. Goal weight for him is 250 pounds   Stress echo in August 2009 was essentially normal with mild LVH, trivial MR and PR.   EKG today shows normal sinus rhythm with rate 85 beats per minute with no significant ST or T wave changes      Outpatient Encounter Prescriptions as of 10/29/2012  Medication Sig Dispense Refill  . albuterol (PROVENTIL) (2.5 MG/3ML) 0.083% nebulizer solution Take 2.5 mg by nebulization every 6 (six) hours as needed for wheezing or shortness of breath.       Marland Kitchen amLODipine (NORVASC) 10 MG tablet Take 1 tablet (10 mg total) by mouth daily.  90 tablet  0  . dexamethasone (DECADRON) 1 MG tablet 2 tablets twice daily for 2 days, one tablet twice daily for 2 days, one tablet daily for 2 days.  15 tablet  0  . methocarbamol (ROBAXIN) 500 MG tablet Take 1 tablet (500 mg total) by mouth every 6 (six) hours as needed (muscle spasm).   60 tablet  3  . metoprolol tartrate (LOPRESSOR) 25 MG tablet Take 1 tablet (25 mg total) by mouth 2 (two) times daily.  60 tablet  1  . nitroGLYCERIN (NITROSTAT) 0.4 MG SL tablet Place 1 tablet (0.4 mg total) under the tongue every 5 (five) minutes as needed.  25 tablet  12  . omeprazole (PRILOSEC) 20 MG capsule Take 1 capsule (20 mg total) by mouth 2 (two) times daily.  60 capsule  1  . oxyCODONE-acetaminophen (PERCOCET/ROXICET) 5-325 MG per tablet Take 1-2 tablets by mouth every 4 (four) hours as needed for pain.  60 tablet  0  . phenytoin (DILANTIN) 100 MG ER capsule Take 200-300 mg by mouth 2 (two) times daily. Take 200mg  in the morning and take 300mg  at bedtime        Review of Systems  Constitutional: Negative.   HENT: Negative.   Eyes: Negative.   Respiratory: Positive for shortness of breath.   Cardiovascular: Negative.   Gastrointestinal: Negative.   Musculoskeletal: Positive for back pain.  Skin: Negative.   Neurological: Negative.   Psychiatric/Behavioral: Positive for sleep disturbance.  All other systems reviewed and are negative.    BP 124/90  Pulse 85  Ht 6\' 2"  (1.88 m)  Wt 267 lb 12 oz (121.451 kg)  BMI 34.36 kg/m2  Physical Exam  Nursing note and vitals reviewed. Constitutional: He is oriented to person, place, and time. He  appears well-developed and well-nourished.  HENT:  Head: Normocephalic.  Nose: Nose normal.  Mouth/Throat: Oropharynx is clear and moist.  Eyes: Conjunctivae are normal. Pupils are equal, round, and reactive to light.  Neck: Normal range of motion. Neck supple. No JVD present.  Cardiovascular: Normal rate, regular rhythm, S1 normal, S2 normal, normal heart sounds and intact distal pulses.  Exam reveals no gallop and no friction rub.   No murmur heard. Pulmonary/Chest: Effort normal and breath sounds normal. No respiratory distress. He has no wheezes. He has no rales. He exhibits no tenderness.  Abdominal: Soft. Bowel sounds are normal. He  exhibits no distension. There is no tenderness.  Musculoskeletal: Normal range of motion. He exhibits no edema and no tenderness.  Lymphadenopathy:    He has no cervical adenopathy.  Neurological: He is alert and oriented to person, place, and time. Coordination normal.  Skin: Skin is warm and dry. No rash noted. No erythema.  Psychiatric: He has a normal mood and affect. His behavior is normal. Judgment and thought content normal.      Assessment and Plan

## 2012-10-29 NOTE — Patient Instructions (Addendum)
You are doing well. No medication changes were made.  Please call us if you have new issues that need to be addressed before your next appt.    

## 2012-10-29 NOTE — Assessment & Plan Note (Signed)
Improved symptoms, possibly from weight loss after surgery. Unable to exclude shortness of breath from chronic pain, shallow breathing/splinting. Currently feels better

## 2012-12-21 ENCOUNTER — Other Ambulatory Visit: Payer: Self-pay | Admitting: Cardiovascular Disease

## 2012-12-22 ENCOUNTER — Other Ambulatory Visit: Payer: Self-pay | Admitting: *Deleted

## 2012-12-22 MED ORDER — METOPROLOL TARTRATE 25 MG PO TABS: 25.0000 mg | ORAL_TABLET | Freq: Two times a day (BID) | ORAL | Status: DC

## 2012-12-22 NOTE — Telephone Encounter (Signed)
Refilled Metoprolo sent to [facility].

## 2013-01-15 ENCOUNTER — Other Ambulatory Visit: Payer: Self-pay | Admitting: Cardiovascular Disease

## 2013-01-16 ENCOUNTER — Other Ambulatory Visit: Payer: Self-pay | Admitting: *Deleted

## 2013-01-16 MED ORDER — AMLODIPINE BESYLATE 10 MG PO TABS: 10.0000 mg | ORAL_TABLET | Freq: Every day | ORAL | Status: DC

## 2013-01-16 NOTE — Telephone Encounter (Signed)
Requested Prescriptions   Signed Prescriptions Disp Refills  . amLODipine (NORVASC) 10 MG tablet 90 tablet 3    Sig: Take 1 tablet (10 mg total) by mouth daily.    Authorizing Provider: GOLLAN, TIMOTHY J    Ordering User: LOPEZ, MARINA C    

## 2013-01-30 ENCOUNTER — Telehealth: Payer: Self-pay

## 2013-01-30 NOTE — Telephone Encounter (Signed)
Request from Disability Determination Services, sent to [employer] on 02/02/2013 .

## 2013-02-18 ENCOUNTER — Telehealth: Payer: Self-pay

## 2013-02-18 NOTE — Telephone Encounter (Signed)
Request from Disability Determination Services , sent to [employer] on 02/18/2013 .

## 2013-02-27 ENCOUNTER — Other Ambulatory Visit: Payer: Self-pay | Admitting: Cardiovascular Disease

## 2013-03-19 ENCOUNTER — Ambulatory Visit (INDEPENDENT_AMBULATORY_CARE_PROVIDER_SITE_OTHER): Payer: BC Managed Care – PPO | Admitting: Cardiovascular Disease

## 2013-03-19 ENCOUNTER — Encounter (INDEPENDENT_AMBULATORY_CARE_PROVIDER_SITE_OTHER): Payer: Self-pay

## 2013-03-19 ENCOUNTER — Encounter: Payer: Self-pay | Admitting: Cardiovascular Disease

## 2013-03-19 VITALS — BP 110/84 | HR 89 | Ht 75.0 in | Wt 277.5 lb

## 2013-03-19 DIAGNOSIS — R0989 Other specified symptoms and signs involving the circulatory and respiratory systems: Secondary | ICD-10-CM | POA: Insufficient documentation

## 2013-03-19 DIAGNOSIS — R079 Chest pain, unspecified: Secondary | ICD-10-CM

## 2013-03-19 DIAGNOSIS — I10 Essential (primary) hypertension: Secondary | ICD-10-CM

## 2013-03-19 DIAGNOSIS — M542 Cervicalgia: Secondary | ICD-10-CM

## 2013-03-19 DIAGNOSIS — R42 Dizziness and giddiness: Secondary | ICD-10-CM

## 2013-03-19 DIAGNOSIS — R0602 Shortness of breath: Secondary | ICD-10-CM

## 2013-03-19 HISTORY — DX: Other specified symptoms and signs involving the circulatory and respiratory systems: R09.89

## 2013-03-19 MED ORDER — HYDRALAZINE HCL 25 MG PO TABS: 25.0000 mg | ORAL_TABLET | Freq: Three times a day (TID) | ORAL | Status: DC | PRN

## 2013-03-19 NOTE — Assessment & Plan Note (Signed)
Currently on lyrica for neck pain.

## 2013-03-19 NOTE — Assessment & Plan Note (Signed)
Blood pressure low on today's visit. Suggested he could take hydralazine as needed for periods of hypertension. Suggested he obtain more measurements of his blood pressure over the next several weeks as it may be well controlled except for select periods. Further medication adjustments based on his numbers could be made

## 2013-03-19 NOTE — Assessment & Plan Note (Signed)
No significant chest pain symptoms on today's visit

## 2013-03-19 NOTE — Patient Instructions (Addendum)
You are doing well. No medication changes were made.  If blood pressure stays high, Ok to take hydralazine as needed  Please call us if you have new issues that need to be addressed before your next appt.

## 2013-03-19 NOTE — Progress Notes (Signed)
Patient ID: Thomas Cole, male    DOB: 24-Aug-1970, 42 y.o.   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  Previous neck fusion from C3-C7, anterior approach on the left.  after the surgery, he had rapid improvement of his shortness of breath.  In followup today, he reports having labile blood pressure. Several recent measurements with systolic pressure 150-160 with diastolic 100-110. He denies any recent significant stressors. He does continue to have pain in his neck at times. Started on lyrica 2 months ago. This seemed to center the pain in his neck, less extremity pain.   Stress echo in August 2009 was essentially normal with mild LVH, trivial MR and PR.   EKG today shows normal sinus rhythm with rate 89 beats per minute with no significant ST or T wave changes      Outpatient Encounter Prescriptions as of 03/19/2013  Medication Sig  . albuterol (PROVENTIL) (2.5 MG/3ML) 0.083% nebulizer solution Take 2.5 mg by nebulization every 6 (six) hours as needed for wheezing or shortness of breath.   Marland Kitchen amLODipine (NORVASC) 10 MG tablet TAKE 1 TABLET (10 MG TOTAL) BY MOUTH DAILY.  Marland Kitchen dexamethasone (DECADRON) 1 MG tablet 2 tablets twice daily for 2 days, one tablet twice daily for 2 days, one tablet daily for 2 days.  Marland Kitchen LYRICA 75 MG capsule Take 75 mg by mouth 4 (four) times daily.   . methocarbamol (ROBAXIN) 500 MG tablet Take 1 tablet (500 mg total) by mouth every 6 (six) hours as needed (muscle spasm).  . metoprolol tartrate (LOPRESSOR)  25 MG tablet Take 1 tablet (25 mg total) by mouth 2 (two) times daily.  . nitroGLYCERIN (NITROSTAT) 0.4 MG SL tablet Place 1 tablet (0.4 mg total) under the tongue every 5 (five) minutes as needed.  Marland Kitchen omeprazole (PRILOSEC) 20 MG capsule TAKE ONE CAPSULE BY MOUTH TWICE A DAY  . oxyCODONE-acetaminophen (PERCOCET/ROXICET) 5-325 MG per tablet Take 1-2 tablets by mouth every 4 (four) hours as needed for pain.  . phenytoin (DILANTIN) 100 MG ER capsule Take 200-300 mg by mouth 2 (two) times daily. Take 200mg  in the morning and take 300mg  at bedtime  . [DISCONTINUED] amLODipine (NORVASC) 10 MG tablet Take 1 tablet (10 mg total) by mouth daily.  . [DISCONTINUED] omeprazole (PRILOSEC) 20 MG capsule Take 1 capsule (20 mg total) by mouth 2 (two) times daily.     Review of Systems  Constitutional: Negative.   HENT: Negative.   Eyes: Negative.   Cardiovascular: Negative.   Gastrointestinal: Negative.   Endocrine: Negative.   Musculoskeletal: Positive for back pain.  Skin: Negative.   Allergic/Immunologic: Negative.   Neurological: Negative.   Hematological: Negative.   Psychiatric/Behavioral: Positive for sleep disturbance.  All other systems reviewed and are negative.    BP 110/84  Pulse 89  Ht 6\' 3"  (1.905 m)  Wt 277 lb 8 oz (125.873 kg)  BMI 34.69 kg/m2  Physical Exam  Nursing note and vitals reviewed. Constitutional: He is oriented to person, place, and time. He appears well-developed and well-nourished.  HENT:  Head: Normocephalic.  Nose: Nose normal.  Mouth/Throat: Oropharynx is clear and moist.  Eyes: Conjunctivae are normal. Pupils are equal, round, and reactive to light.  Neck: Normal range of motion. Neck supple. No JVD present.  Cardiovascular: Normal rate, regular rhythm, S1 normal, S2 normal, normal heart sounds and intact distal pulses.  Exam reveals no gallop and no friction rub.   No murmur heard. Pulmonary/Chest: Effort normal and breath sounds normal. No respiratory  distress. He has no wheezes. He has no rales. He exhibits no tenderness.  Abdominal: Soft. Bowel sounds are normal. He exhibits no distension. There is no tenderness.  Musculoskeletal: Normal range of motion. He exhibits no edema and no tenderness.  Lymphadenopathy:    He has no cervical adenopathy.  Neurological: He is alert and oriented to person, place, and time. Coordination normal.  Skin: Skin is warm and dry. No rash noted. No erythema.  Psychiatric: He has a normal mood and affect. His behavior is normal. Judgment and thought content normal.      Assessment and Plan

## 2013-04-01 ENCOUNTER — Encounter: Payer: Self-pay | Admitting: Podiatry

## 2013-04-08 ENCOUNTER — Ambulatory Visit: Payer: Self-pay | Admitting: Podiatry

## 2013-04-18 ENCOUNTER — Other Ambulatory Visit: Payer: Self-pay | Admitting: Cardiovascular Disease

## 2013-08-19 ENCOUNTER — Other Ambulatory Visit: Payer: Self-pay | Admitting: Cardiovascular Disease

## 2013-09-23 ENCOUNTER — Encounter: Payer: Self-pay | Admitting: Podiatry

## 2013-09-23 ENCOUNTER — Ambulatory Visit (INDEPENDENT_AMBULATORY_CARE_PROVIDER_SITE_OTHER): Payer: BC Managed Care – PPO

## 2013-09-23 ENCOUNTER — Ambulatory Visit (INDEPENDENT_AMBULATORY_CARE_PROVIDER_SITE_OTHER): Payer: BC Managed Care – PPO | Admitting: Podiatry

## 2013-09-23 VITALS — BP 147/81 | HR 77 | Resp 16 | Ht 74.0 in | Wt 270.0 lb

## 2013-09-23 DIAGNOSIS — M7750 Other enthesopathy of unspecified foot: Secondary | ICD-10-CM

## 2013-09-23 DIAGNOSIS — M7751 Other enthesopathy of right foot: Secondary | ICD-10-CM

## 2013-09-23 NOTE — Progress Notes (Signed)
He presents today states this been started hurting again back in early January may be February he states it is become more painful as time goes on his refers to the subtalar joint of his right foot.  Objective: Vital signs are stable he is alert and oriented x3. Pain on palpation and on end range of motion of the subtalar joint of the right foot. Radiographic evaluation doesn't demonstrate any changes to the ankle joint and subtalar joint from previous radiographs.  Assessment: Pain in limb secondary to subtalar joint capsulitis right foot.  Plan: Injected subtalar joint right foot today with Kenalog and local anesthetic.

## 2013-10-18 ENCOUNTER — Other Ambulatory Visit: Payer: Self-pay | Admitting: Cardiovascular Disease

## 2013-10-21 ENCOUNTER — Other Ambulatory Visit: Payer: Self-pay | Admitting: Cardiovascular Disease

## 2013-10-24 ENCOUNTER — Other Ambulatory Visit: Payer: Self-pay | Admitting: Family Medicine

## 2013-10-25 NOTE — Telephone Encounter (Signed)
Patient of Dr. Junius Roads that you have never seen.  Is scheduled with Webb Silversmith 11/11/2013.  Refill?

## 2013-10-25 NOTE — Telephone Encounter (Signed)
Reasonable to dispense a 30 day supply in this instance.  Cc: Webb Silversmith

## 2013-11-11 ENCOUNTER — Encounter: Payer: Self-pay | Admitting: Internal Medicine

## 2013-11-11 ENCOUNTER — Ambulatory Visit (INDEPENDENT_AMBULATORY_CARE_PROVIDER_SITE_OTHER): Payer: BC Managed Care – PPO | Admitting: Internal Medicine

## 2013-11-11 ENCOUNTER — Telehealth: Payer: Self-pay | Admitting: Internal Medicine

## 2013-11-11 ENCOUNTER — Encounter (INDEPENDENT_AMBULATORY_CARE_PROVIDER_SITE_OTHER): Payer: Self-pay

## 2013-11-11 VITALS — BP 142/88 | HR 78 | Temp 98.2°F | Ht 72.5 in | Wt 261.0 lb

## 2013-11-11 DIAGNOSIS — E66813 Obesity, class 3: Secondary | ICD-10-CM | POA: Insufficient documentation

## 2013-11-11 DIAGNOSIS — F3289 Other specified depressive episodes: Secondary | ICD-10-CM

## 2013-11-11 DIAGNOSIS — G43909 Migraine, unspecified, not intractable, without status migrainosus: Secondary | ICD-10-CM | POA: Insufficient documentation

## 2013-11-11 DIAGNOSIS — I10 Essential (primary) hypertension: Secondary | ICD-10-CM

## 2013-11-11 DIAGNOSIS — F329 Major depressive disorder, single episode, unspecified: Secondary | ICD-10-CM

## 2013-11-11 DIAGNOSIS — M542 Cervicalgia: Secondary | ICD-10-CM

## 2013-11-11 DIAGNOSIS — E669 Obesity, unspecified: Secondary | ICD-10-CM

## 2013-11-11 DIAGNOSIS — Z87898 Personal history of other specified conditions: Secondary | ICD-10-CM | POA: Insufficient documentation

## 2013-11-11 DIAGNOSIS — K219 Gastro-esophageal reflux disease without esophagitis: Secondary | ICD-10-CM | POA: Insufficient documentation

## 2013-11-11 DIAGNOSIS — G8929 Other chronic pain: Secondary | ICD-10-CM

## 2013-11-11 DIAGNOSIS — J45909 Unspecified asthma, uncomplicated: Secondary | ICD-10-CM

## 2013-11-11 DIAGNOSIS — M47812 Spondylosis without myelopathy or radiculopathy, cervical region: Secondary | ICD-10-CM

## 2013-11-11 DIAGNOSIS — F32A Depression, unspecified: Secondary | ICD-10-CM | POA: Insufficient documentation

## 2013-11-11 DIAGNOSIS — J4520 Mild intermittent asthma, uncomplicated: Secondary | ICD-10-CM | POA: Insufficient documentation

## 2013-11-11 DIAGNOSIS — G43019 Migraine without aura, intractable, without status migrainosus: Secondary | ICD-10-CM

## 2013-11-11 DIAGNOSIS — G4730 Sleep apnea, unspecified: Secondary | ICD-10-CM

## 2013-11-11 DIAGNOSIS — R5381 Other malaise: Secondary | ICD-10-CM

## 2013-11-11 DIAGNOSIS — R569 Unspecified convulsions: Secondary | ICD-10-CM

## 2013-11-11 DIAGNOSIS — Z13220 Encounter for screening for lipoid disorders: Secondary | ICD-10-CM

## 2013-11-11 DIAGNOSIS — R5383 Other fatigue: Secondary | ICD-10-CM

## 2013-11-11 HISTORY — DX: Migraine, unspecified, not intractable, without status migrainosus: G43.909

## 2013-11-11 LAB — CBC
HCT: 44.7 % (ref 39.0–52.0)
HEMOGLOBIN: 14.9 g/dL (ref 13.0–17.0)
MCHC: 33.3 g/dL (ref 30.0–36.0)
MCV: 92.7 fl (ref 78.0–100.0)
Platelets: 209 10*3/uL (ref 150.0–400.0)
RBC: 4.82 Mil/uL (ref 4.22–5.81)
RDW: 13.6 % (ref 11.5–15.5)
WBC: 10.3 10*3/uL (ref 4.0–10.5)

## 2013-11-11 LAB — COMPREHENSIVE METABOLIC PANEL
ALT: 26 U/L (ref 0–53)
AST: 26 U/L (ref 0–37)
Albumin: 4.4 g/dL (ref 3.5–5.2)
Alkaline Phosphatase: 101 U/L (ref 39–117)
BUN: 19 mg/dL (ref 6–23)
CO2: 29 mEq/L (ref 19–32)
CREATININE: 1 mg/dL (ref 0.4–1.5)
Calcium: 9.4 mg/dL (ref 8.4–10.5)
Chloride: 103 mEq/L (ref 96–112)
GFR: 84.34 mL/min (ref >60.00)
GLUCOSE: 78 mg/dL (ref 70–99)
Potassium: 4.5 mEq/L (ref 3.5–5.1)
Sodium: 138 mEq/L (ref 135–145)
Total Bilirubin: 0.5 mg/dL (ref 0.2–1.2)
Total Protein: 7.3 g/dL (ref 6.0–8.3)

## 2013-11-11 LAB — LIPID PANEL
CHOL/HDL RATIO: 3
CHOLESTEROL: 154 mg/dL (ref 0–200)
HDL: 48 mg/dL (ref >39.00)
LDL Cholesterol: 93 mg/dL (ref 0–99)
NonHDL: 106
TRIGLYCERIDES: 63 mg/dL (ref 0.0–149.0)
VLDL: 12.6 mg/dL (ref 0.0–40.0)

## 2013-11-11 LAB — VITAMIN B12: VITAMIN B 12: 420 pg/mL (ref 211–911)

## 2013-11-11 LAB — TSH: TSH: 0.94 u[IU]/mL (ref 0.35–4.50)

## 2013-11-11 MED ORDER — ALBUTEROL SULFATE HFA 108 (90 BASE) MCG/ACT IN AERS: 2.0000 | INHALATION_SPRAY | Freq: Four times a day (QID) | RESPIRATORY_TRACT | Status: DC | PRN

## 2013-11-11 MED ORDER — HYDROCHLOROTHIAZIDE 25 MG PO TABS: 25.0000 mg | ORAL_TABLET | Freq: Every day | ORAL | Status: DC

## 2013-11-11 NOTE — Assessment & Plan Note (Signed)
Well controlled on prilosec.

## 2013-11-11 NOTE — Assessment & Plan Note (Signed)
No seizures since 1987 Continue dilantin at this time If you decide you want to stop the medication, will have you seen by neurology first

## 2013-11-11 NOTE — Assessment & Plan Note (Signed)
Secondary to chronic neck issues Takes tylenol and advil Does not want any other RX at this time

## 2013-11-11 NOTE — Assessment & Plan Note (Signed)
Rare albuterol use Will refill today so that he can have it on hand

## 2013-11-11 NOTE — Telephone Encounter (Signed)
Relevant patient education assigned to patient using Emmi. ° °

## 2013-11-11 NOTE — Assessment & Plan Note (Signed)
Mild, chronic Not medicated

## 2013-11-11 NOTE — Assessment & Plan Note (Signed)
Will refer to pain clinic per pt request

## 2013-11-11 NOTE — Progress Notes (Signed)
Pre visit review using our clinic review tool, if applicable. No additional management support is needed unless otherwise documented below in the visit note. 

## 2013-11-11 NOTE — Progress Notes (Signed)
HPI  Pt presents to the clinic today to establish care. He is transferring care from Dr. Derry Lory in [place]. He does have some concerns today about fatigue. This has been going on for a number of years but seems to be getting worse. He did have a sleep study which did show mild sleep apnea, but he was unable to keep the mask on at night. He does sleep walk and he would pull the mask off not knowing it. He does not like to take sleep aids because the make him feel drowsy.  Migraines: Occur 2-3 times per week. Secondary to nerve damage in the neck. He gets little relief from Advil and Tylenol. He has tried many prescription things but none that have worked.   HTN: BP good today but he reports it is very labile. He is on Norvasc and Metoprolol. He denies chest pain. He has had a full cardiac workup by Dr. Rockey Situ.  Asthma: Mild and intermittent. Usually worse in hot weather. His current inhaler has expired. He would like to have it refilled in case he needs it.   Seizures: No seizure since 1987. He is taking his dilantin. He is thinking about trying to come off of it but has not made a decision about it. He does not follow with neurology.  GERD: Well controlled on prilosec.  Depression: Chronic but mild. Has been on antidepressant in the past. Does not feel like he needs to restart it at the current time.  Chronic neck pain: only tries tylenol and advil. Would like referral to pain management.  Flu: never Tetanus: ? 2010 Eye Doctor: as needed Dentist: as needed  Past Medical History  Diagnosis Date  . Hypertension   . Migraine headache   . Asthma   . Back problem   . Anxiety   . GERD (gastroesophageal reflux disease)   . Complication of anesthesia     occ takes longer to wake  . Shortness of breath   . Pneumonia     can not use cpap  . Seizures     last 87  . Depression     Current Outpatient Prescriptions  Medication Sig Dispense Refill  . amLODipine (NORVASC) 10 MG tablet  TAKE 1 TABLET (10 MG TOTAL) BY MOUTH DAILY.  90 tablet  0  . metoprolol tartrate (LOPRESSOR) 25 MG tablet TAKE 1 TABLET BY MOUTH TWICE A DAY  60 tablet  6  . omeprazole (PRILOSEC) 20 MG capsule TAKE ONE CAPSULE BY MOUTH TWICE A DAY  60 capsule  3  . phenytoin (DILANTIN) 100 MG ER capsule TAKE 2 CAPSULES BY MOUTH IN THE MORNING AND TAKE 3 CAPSULES IN THE EVENING AS DIRECTED  150 capsule  0  . albuterol (PROVENTIL) (2.5 MG/3ML) 0.083% nebulizer solution Take 2.5 mg by nebulization every 6 (six) hours as needed for wheezing or shortness of breath.       . nitroGLYCERIN (NITROSTAT) 0.4 MG SL tablet Place 1 tablet (0.4 mg total) under the tongue every 5 (five) minutes as needed.  25 tablet  12   No current facility-administered medications for this visit.    Allergies  Allergen Reactions  . Bee Venom Itching and Swelling  . Chlorhexidine Itching    REACTION TO WIPES/ CLOTHES ONLY==he can tolerate the SCRUB LIQUID  . Carbamazepine Itching and Rash  . Meloxicam Itching and Rash  . Sulfa Antibiotics Itching and Rash    Family History  Problem Relation Age of Onset  . Coronary  artery disease Mother   . Hypertension Mother   . Mental illness Mother   . Arthritis Mother   . Coronary artery disease Father   . Heart disease Father   . Arthritis Father   . Arthritis Maternal Grandmother   . Heart disease Maternal Grandmother   . Hypertension Maternal Grandmother   . Arthritis Maternal Grandfather   . Heart disease Maternal Grandfather   . Hypertension Maternal Grandfather   . Arthritis Paternal Grandmother   . Heart disease Paternal Grandmother   . Hypertension Paternal Grandmother   . Arthritis Paternal Grandfather   . Heart disease Paternal Grandfather   . Hypertension Paternal Grandfather     History   Social History  . Marital Status: Single    Spouse Name: N/A    Number of Children: N/A  . Years of Education: N/A   Occupational History  . Not on file.   Social History  Main Topics  . Smoking status: Never Smoker   . Smokeless tobacco: Never Used  . Alcohol Use: No  . Drug Use: No  . Sexual Activity: Not on file   Other Topics Concern  . Not on file   Social History Narrative  . No narrative on file    ROS:  Constitutional: Pt reports fatigue. Denies fever, malaise, headache or abrupt weight changes.  Respiratory: Pt reports occasional shortness of breath. Denies difficulty breathing, cough or sputum production.   Cardiovascular: Denies chest pain, chest tightness, palpitations or swelling in the hands or feet.  Gastrointestinal: Denies abdominal pain, bloating, constipation, diarrhea or blood in the stool.  Musculoskeletal: Pt reports chronic neck pain. Denies difficulty with gait, muscle pain or joint swelling.  Neurological: Denies dizziness, difficulty with memory, difficulty with speech or problems with balance and coordination.  Psych: Pt reports depression. Denies anxiety, SI/HI.  No other specific complaints in a complete review of systems (except as listed in HPI above).  PE:  BP 142/88  Pulse 78  Temp(Src) 98.2 F (36.8 C) (Oral)  Ht 6' 0.5" (1.842 m)  Wt 261 lb (118.389 kg)  BMI 34.89 kg/m2  SpO2 98% Wt Readings from Last 3 Encounters:  11/11/13 261 lb (118.389 kg)  09/23/13 270 lb (122.471 kg)  03/19/13 277 lb 8 oz (125.873 kg)    General: Appears his stated age, obese but well developed, well nourished in NAD. Cardiovascular: Normal rate and rhythm. S1,S2 noted.  No murmur, rubs or gallops noted. No JVD or BLE edema. No carotid bruits noted. Pulmonary/Chest: Normal effort and positive vesicular breath sounds. No respiratory distress. No wheezes, rales or ronchi noted.  Abdomen: Soft and nontender. Normal bowel sounds, no bruits noted. No distention or masses noted. Liver, spleen and kidneys non palpable. Musculoskeletal: Decreased flexion, extension and rotation of the cervical spine.  Neurological: Alert and oriented.  Cranial nerves II-XII grossly intact. Psychiatric: Mood normal and affect depressed. Behavior is normal. Judgment and thought content normal.     BMET    Component Value Date/Time   NA 140 09/03/2012 1530   K 4.1 09/03/2012 1530   CL 107 09/03/2012 1530   CO2 24 09/03/2012 1530   GLUCOSE 122* 09/03/2012 1530   BUN 13 09/03/2012 1530   CREATININE 0.97 09/03/2012 1530   CALCIUM 9.2 09/03/2012 1530   GFRNONAA >90 09/03/2012 1530   GFRAA >90 09/03/2012 1530    Lipid Panel  No results found for this basename: chol, trig, hdl, cholhdl, vldl, ldlcalc    CBC  Component Value Date/Time   WBC 6.4 09/03/2012 1530   RBC 4.92 09/03/2012 1530   HGB 15.4 09/03/2012 1530   HCT 44.1 09/03/2012 1530   PLT 202 09/03/2012 1530   MCV 89.6 09/03/2012 1530   MCH 31.3 09/03/2012 1530   MCHC 34.9 09/03/2012 1530   RDW 13.3 09/03/2012 1530   LYMPHSABS 3.0 01/16/2008 2305   MONOABS 1.3* 01/16/2008 2305   EOSABS 0.1 01/16/2008 2305   BASOSABS 0.0 01/16/2008 2305    Hgb A1C No results found for this basename: HGBA1C     Assessment and Plan:  Fatigue:  Will check TSH and B12 today

## 2013-11-11 NOTE — Assessment & Plan Note (Signed)
Not able to tolerate CPAP

## 2013-11-11 NOTE — Patient Instructions (Addendum)

## 2013-11-11 NOTE — Assessment & Plan Note (Signed)
Labile on norvasc and metoprolol No history of CAD, MI, tachyarrhythmia Will d/c metoprolol-may improve SOB Will start HCTZ Continue norvasc Let me know if blood pressure is sustained > 140/90

## 2013-11-11 NOTE — Assessment & Plan Note (Signed)
Encouraged him to work on diet and exercise 

## 2013-12-01 ENCOUNTER — Other Ambulatory Visit: Payer: Self-pay | Admitting: Family Medicine

## 2013-12-02 ENCOUNTER — Ambulatory Visit (INDEPENDENT_AMBULATORY_CARE_PROVIDER_SITE_OTHER): Payer: BC Managed Care – PPO | Admitting: Podiatry

## 2013-12-02 ENCOUNTER — Ambulatory Visit (INDEPENDENT_AMBULATORY_CARE_PROVIDER_SITE_OTHER): Payer: BC Managed Care – PPO

## 2013-12-02 VITALS — BP 158/117 | HR 92 | Resp 16

## 2013-12-02 DIAGNOSIS — M7750 Other enthesopathy of unspecified foot: Secondary | ICD-10-CM

## 2013-12-02 DIAGNOSIS — M778 Other enthesopathies, not elsewhere classified: Secondary | ICD-10-CM

## 2013-12-02 DIAGNOSIS — M779 Enthesopathy, unspecified: Secondary | ICD-10-CM

## 2013-12-02 NOTE — Progress Notes (Signed)
He presents today with a chief complaint of a short-lived pain to the dorsal lateral aspect of his left foot it feels very much like the pain in his right foot. He states that his right foot currently is doing very well but the left is starting become more troublesome as time goes on.  Objective: Vital signs are stable he is alert and oriented x3. Pulses are palpable left foot. He has pain on end range of motion of the subtalar joint left. Radiographic evaluation does demonstrate some early osteoarthritic changes to the ankle joint but not to the subtalar joint.  Assessment: Based on physical examination capsulitis sinus tarsitis of the left foot is present.  Plan: Intra-articular injection of Kenalog and local anesthetic after sterile Betadine skin prep today. 20 mg was injected I will followup with him in the near future.

## 2013-12-10 ENCOUNTER — Ambulatory Visit (INDEPENDENT_AMBULATORY_CARE_PROVIDER_SITE_OTHER): Payer: BC Managed Care – PPO | Admitting: Family Medicine

## 2013-12-10 ENCOUNTER — Encounter: Payer: Self-pay | Admitting: Family Medicine

## 2013-12-10 ENCOUNTER — Telehealth: Payer: Self-pay

## 2013-12-10 VITALS — BP 114/70 | HR 82 | Temp 98.4°F | Ht 72.5 in | Wt 261.2 lb

## 2013-12-10 DIAGNOSIS — G43819 Other migraine, intractable, without status migrainosus: Secondary | ICD-10-CM

## 2013-12-10 DIAGNOSIS — I10 Essential (primary) hypertension: Secondary | ICD-10-CM

## 2013-12-10 DIAGNOSIS — G4730 Sleep apnea, unspecified: Secondary | ICD-10-CM

## 2013-12-10 MED ORDER — HYDRALAZINE HCL 10 MG PO TABS: 10.0000 mg | ORAL_TABLET | Freq: Three times a day (TID) | ORAL | Status: DC | PRN

## 2013-12-10 NOTE — Assessment & Plan Note (Signed)
Likely caused by BP elevations. Improve BP  fluctuations then consider other meds if continuing.  No neuro changes, and unbearable headache resolved.

## 2013-12-10 NOTE — Telephone Encounter (Signed)
Agreed -

## 2013-12-10 NOTE — Progress Notes (Signed)
   Subjective:    Patient ID: Thomas Cole, male    DOB: 07-18-1970, 42 y.o.   MRN: [id number]  Hypertension Associated symptoms include headaches. Pertinent negatives include no chest pain, palpitations or shortness of breath.  Headache  Pertinent negatives include no abdominal pain, coughing, ear pain, eye pain or fever. His past medical history is significant for hypertension.   44 year old male pt of  [place] with history of migraine and HTN presents for worsening headaches and elevated BP.  He reports he has noted BPs 148/100 at last MD OV at pain center.  150s/116 at podiatrist Has been having severe unbearable headache in last few days associated with BP 150/106. When BP is high he becomes red, flushed, dizzy, sweaty  He is now on amlodipine 10 mg daily and HCTZ 25 mg daily. He was previously fluctuating on metoprolol and this was changed at last OV.  He has noted improvement in peripheral edema on HCTZ.   He has long history of shortness of breath episodes, chest pain episodes with workup at outside facilities including [facility] including a stress echo, Cardiopulmonary treadmill study, sleep studies with known obstructive sleep apnea who is currently not on CPAP, history of sleep walking and sleep disruption , long history of neck pathology, status post neck fusion from C4-C6 in March of 2011  Last OV with Dr. Rockey Situ: 12/2012 Recommended considering hydralazine prn BP spikes.  He has chronic pain for neck injury.. Does cause him pain.   No chest pain, no SOB. No recent neuro changes but he does have intermittent speech issue that neuro surgery has thought it was due to neck issues.        Review of Systems  Constitutional: Positive for fatigue. Negative for fever.  HENT: Negative for ear pain.   Eyes: Negative for pain.  Respiratory: Negative for cough, shortness of breath and wheezing.   Cardiovascular: Negative for chest pain, palpitations and leg swelling.    Gastrointestinal: Negative for abdominal pain.  Neurological: Positive for headaches.       Objective:   Physical Exam  Constitutional: He is oriented to person, place, and time. Vital signs are normal. He appears well-developed and well-nourished.  HENT:  Head: Normocephalic.  Right Ear: Hearing normal.  Left Ear: Hearing normal.  Nose: Nose normal.  Mouth/Throat: Oropharynx is clear and moist and mucous membranes are normal.  Neck: Trachea normal. Carotid bruit is not present. No mass and no thyromegaly present.  Cardiovascular: Normal rate, regular rhythm and normal pulses.  Exam reveals no gallop, no distant heart sounds and no friction rub.   No murmur heard. No peripheral edema  Pulmonary/Chest: Effort normal and breath sounds normal. No respiratory distress.  Neurological: He is alert and oriented to person, place, and time. He has normal strength and normal reflexes. No cranial nerve deficit or sensory deficit. He exhibits normal muscle tone. He displays a negative Romberg sign. Coordination and gait normal.  Skin: Skin is warm, dry and intact. No rash noted.  Psychiatric: He has a normal mood and affect. His speech is normal and behavior is normal. Thought content normal.          Assessment & Plan:

## 2013-12-10 NOTE — Telephone Encounter (Signed)
Pt left v/m; pt's BP has been running high and request cb. Left v/m requesting pt to cb.

## 2013-12-10 NOTE — Assessment & Plan Note (Signed)
Unfortunately difficult to treat with sleep walking and removal of mask.. Likely worsening BPs.

## 2013-12-10 NOTE — Progress Notes (Signed)
Pre visit review using our clinic review tool, if applicable. No additional management support is needed unless otherwise documented below in the visit note. 

## 2013-12-10 NOTE — Telephone Encounter (Signed)
Pt called back pt was seen 11/11/13 and pt is taking amlodipine 10 mg one daily and HCTZ 25 mg one daily. When pt first started HCTZ BP was around 120/70. For last 2 weeks averaging 148/100 and 09/08-09/15 BP 150/110.Pt having massive headaches; at times unbearable; occasional dizziness upon movement. No CP,SOB. [facility]. Pt request cb.

## 2013-12-10 NOTE — Telephone Encounter (Signed)
Spoke with Webb Silversmith NP and she would like pt seen sooner than 4 PM. Pt scheduled with Dr Diona Browner today at 2:45. Advised pt if condition changes or worsens prior to appt for pt to call office or go to UC. Pt voiced understanding.

## 2013-12-10 NOTE — Patient Instructions (Signed)
Continue the amlodipine and HCTZ. Can use hydralazine as needed for several BPs > 160/100.  Stop at the lab on way put for pheochromocytoma test. Follow up with PCP in 1-2 , bring BP measurments at that OV.  If severe headache and new neurologic changes ... Go to ER.

## 2013-12-16 ENCOUNTER — Encounter: Payer: BC Managed Care – PPO | Admitting: Internal Medicine

## 2013-12-23 ENCOUNTER — Ambulatory Visit: Payer: BC Managed Care – PPO | Admitting: Internal Medicine

## 2013-12-24 NOTE — Addendum Note (Signed)
Addended by: Ellamae Sia on: 12/24/2013 09:06 AM   Modules accepted: Orders

## 2013-12-31 DIAGNOSIS — Z7689 Persons encountering health services in other specified circumstances: Secondary | ICD-10-CM

## 2014-01-06 ENCOUNTER — Ambulatory Visit: Payer: BC Managed Care – PPO | Admitting: Internal Medicine

## 2014-01-13 ENCOUNTER — Ambulatory Visit (INDEPENDENT_AMBULATORY_CARE_PROVIDER_SITE_OTHER): Payer: BC Managed Care – PPO | Admitting: Internal Medicine

## 2014-01-13 ENCOUNTER — Encounter: Payer: Self-pay | Admitting: Internal Medicine

## 2014-01-13 VITALS — BP 146/92 | HR 85 | Temp 98.0°F | Ht 72.5 in | Wt 260.0 lb

## 2014-01-13 DIAGNOSIS — Z0000 Encounter for general adult medical examination without abnormal findings: Secondary | ICD-10-CM

## 2014-01-13 DIAGNOSIS — I10 Essential (primary) hypertension: Secondary | ICD-10-CM

## 2014-01-13 MED ORDER — HYDROCHLOROTHIAZIDE 25 MG PO TABS: 25.0000 mg | ORAL_TABLET | Freq: Every day | ORAL | Status: DC

## 2014-01-13 MED ORDER — AMLODIPINE BESYLATE 10 MG PO TABS: ORAL_TABLET | ORAL | Status: DC

## 2014-01-13 MED ORDER — LISINOPRIL 10 MG PO TABS: 10.0000 mg | ORAL_TABLET | Freq: Every day | ORAL | Status: DC

## 2014-01-13 NOTE — Assessment & Plan Note (Signed)
Will add lisinopril to you regimen Continue hydralazine for BP > 160/90 Will obtain renal artery duplex

## 2014-01-13 NOTE — Patient Instructions (Addendum)

## 2014-01-13 NOTE — Progress Notes (Signed)
Subjective:    Patient ID: Thomas Cole, male    DOB: 01/23/1971, 43 y.o.   MRN: [id number]  HPI  Pt presents to the clinic today for his annual physical. He does need a medical form filled out in order for him to renew his drivers license at the [facility]. He also reports elevations in blood pressure. When his blood pressure is elevated, he does have palpitations, headaches, dizziness, and shortness of breath. He is on norvasc, HCTZ and has hydralazine prn. He will need medication refills today.  Flu: never Tetanus: ? 2010 Eye Doctor: as needed Dentist: as needed  Review of Systems      Past Medical History  Diagnosis Date  . Hypertension   . Migraine headache   . Asthma   . Back problem   . Anxiety   . GERD (gastroesophageal reflux disease)   . Complication of anesthesia     occ takes longer to wake  . Shortness of breath   . Pneumonia     can not use cpap  . Seizures     last 87  . Depression     Current Outpatient Prescriptions  Medication Sig Dispense Refill  . albuterol (PROVENTIL HFA;VENTOLIN HFA) 108 (90 BASE) MCG/ACT inhaler Inhale 2 puffs into the lungs every 6 (six) hours as needed for wheezing or shortness of breath.  1 Inhaler  11  . amLODipine (NORVASC) 10 MG tablet TAKE 1 TABLET (10 MG TOTAL) BY MOUTH DAILY.  90 tablet  0  . hydrALAZINE (APRESOLINE) 10 MG tablet Take 1 tablet (10 mg total) by mouth 3 (three) times daily as needed (for BP fluctuations).  30 tablet  0  . hydrochlorothiazide (HYDRODIURIL) 25 MG tablet Take 1 tablet (25 mg total) by mouth daily.  30 tablet  2  . nitroGLYCERIN (NITROSTAT) 0.4 MG SL tablet Place 1 tablet (0.4 mg total) under the tongue every 5 (five) minutes as needed.  25 tablet  12  . omeprazole (PRILOSEC) 20 MG capsule TAKE ONE CAPSULE BY MOUTH TWICE A DAY  60 capsule  3  . phenytoin (DILANTIN) 100 MG ER capsule TAKE 2 CAPSULES BY MOUTH IN THE MORNING AND TAKE 3 CAPSULES IN THE EVENING AS DIRECTED  150 capsule  2   No current  facility-administered medications for this visit.    Allergies  Allergen Reactions  . Bee Venom Itching and Swelling  . Chlorhexidine Itching    REACTION TO WIPES/ CLOTHES ONLY==he can tolerate the SCRUB LIQUID  . Carbamazepine Itching and Rash  . Meloxicam Itching and Rash  . Sulfa Antibiotics Itching and Rash    Family History  Problem Relation Age of Onset  . Coronary artery disease Mother   . Hypertension Mother   . Mental illness Mother   . Arthritis Mother   . Coronary artery disease Father   . Heart disease Father   . Arthritis Father   . Arthritis Maternal Grandmother   . Heart disease Maternal Grandmother   . Hypertension Maternal Grandmother   . Arthritis Maternal Grandfather   . Heart disease Maternal Grandfather   . Hypertension Maternal Grandfather   . Arthritis Paternal Grandmother   . Heart disease Paternal Grandmother   . Hypertension Paternal Grandmother   . Arthritis Paternal Grandfather   . Heart disease Paternal Grandfather   . Hypertension Paternal Grandfather   . Cancer Neg Hx   . Diabetes Neg Hx   . Stroke Neg Hx     History  Social History  . Marital Status: Single    Spouse Name: N/A    Number of Children: N/A  . Years of Education: N/A   Occupational History  . Not on file.   Social History Main Topics  . Smoking status: Never Smoker   . Smokeless tobacco: Never Used  . Alcohol Use: No  . Drug Use: No  . Sexual Activity: Yes   Other Topics Concern  . Not on file   Social History Narrative  . No narrative on file     Constitutional: Pt reports fatigue, headache. Denies fever, malaise, fatigue, or abrupt weight changes.  Respiratory: Pt reports shortness of breath. Denies difficulty breathing,  cough or sputum production.   Cardiovascular: Denies chest pain, chest tightness, palpitations or swelling in the hands or feet.  Gastrointestinal: Denies abdominal pain, bloating, constipation, diarrhea or blood in the stool.  GU:  Denies urgency, frequency, pain with urination, burning sensation, blood in urine, odor or discharge. Musculoskeletal: Pt reports neck pain. Denies difficulty with gait, muscle pain or joint pain and swelling.  Skin: Denies redness, rashes, lesions or ulcercations.  Neurological: Pt reports dizziness. Denies difficulty with memory, difficulty with speech or problems with balance and coordination.   No other specific complaints in a complete review of systems (except as listed in HPI above).  Objective:   Physical Exam   BP 146/92  Pulse 85  Temp(Src) 98 F (36.7 C) (Oral)  Ht 6' 0.5" (1.842 m)  Wt 260 lb (117.935 kg)  BMI 34.76 kg/m2  SpO2 98% Wt Readings from Last 3 Encounters:  01/13/14 260 lb (117.935 kg)  12/10/13 261 lb 4 oz (118.502 kg)  11/11/13 261 lb (118.389 kg)    General: Appears his ted age, obese but well developed, well nourished in NAD. Skin: Warm, dry and intact. No rashes, lesions or ulcerations noted. HEENT: Head: normal shape and size; Eyes: sclera white, no icterus, conjunctiva pink, PERRLA and EOMs intact; Ears: Tm's gray and intact, normal light reflex; Nose: mucosa pink and moist, septum midline; Throat/Mouth: Teeth present, mucosa pink and moist, no exudate, lesions or ulcerations noted.   Cardiovascular: Normal rate and rhythm. S1,S2 noted.  No murmur, rubs or gallops noted. No JVD or BLE edema. No carotid bruits noted. Pulmonary/Chest: Normal effort and positive vesicular breath sounds. No respiratory distress. No wheezes, rales or ronchi noted.  Abdomen: Soft and nontender. Normal bowel sounds, no bruits noted. No distention or masses noted. Liver, spleen and kidneys non palpable. Musculoskeletal: Decreased flexion, extension and lateral rotation. Pain with palpation of the cervical spine.  Neurological: Alert and oriented. Cranial nerves II-XII grossly intact. Coordination normal.  Psychiatric: Mood and affect normal. Behavior is normal. Judgment and  thought content normal.    BMET    Component Value Date/Time   NA 138 11/11/2013 1402   K 4.5 11/11/2013 1402   CL 103 11/11/2013 1402   CO2 29 11/11/2013 1402   GLUCOSE 78 11/11/2013 1402   BUN 19 11/11/2013 1402   CREATININE 1.0 11/11/2013 1402   CALCIUM 9.4 11/11/2013 1402   GFRNONAA >90 09/03/2012 1530   GFRAA >90 09/03/2012 1530    Lipid Panel     Component Value Date/Time   CHOL 154 11/11/2013 1402   TRIG 63.0 11/11/2013 1402   HDL 48.00 11/11/2013 1402   CHOLHDL 3 11/11/2013 1402   VLDL 12.6 11/11/2013 1402   LDLCALC 93 11/11/2013 1402    CBC    Component Value Date/Time  WBC 10.3 11/11/2013 1402   RBC 4.82 11/11/2013 1402   HGB 14.9 11/11/2013 1402   HCT 44.7 11/11/2013 1402   PLT 209.0 11/11/2013 1402   MCV 92.7 11/11/2013 1402   MCH 31.3 09/03/2012 1530   MCHC 33.3 11/11/2013 1402   RDW 13.6 11/11/2013 1402   LYMPHSABS 3.0 01/16/2008 2305   MONOABS 1.3* 01/16/2008 2305   EOSABS 0.1 01/16/2008 2305   BASOSABS 0.0 01/16/2008 2305    Hgb A1C No results found for this basename: HGBA1C        Assessment & Plan:   Preventative Health Maintenance:  Labs from 10/2013 reviewed He declines flu shot Encouraged him to visit an eye doctor and dentist  RTC in 6 months or sooner if needed

## 2014-01-13 NOTE — Progress Notes (Signed)
Pre visit review using our clinic review tool, if applicable. No additional management support is needed unless otherwise documented below in the visit note. 

## 2014-01-15 ENCOUNTER — Telehealth: Payer: Self-pay

## 2014-01-15 NOTE — Telephone Encounter (Signed)
Left detailed msg on VM per HIPAA letting pt know his DMV forms are ready for pick up--also if he would like me to fax for him as i have a copy

## 2014-01-18 ENCOUNTER — Other Ambulatory Visit: Payer: Self-pay | Admitting: *Deleted

## 2014-01-18 DIAGNOSIS — I10 Essential (primary) hypertension: Secondary | ICD-10-CM

## 2014-01-18 MED ORDER — AMLODIPINE BESYLATE 10 MG PO TABS: ORAL_TABLET | ORAL | Status: DC

## 2014-01-19 NOTE — Telephone Encounter (Addendum)
Pt left v/m; pt request DMV form faxed and pt will pick up copy at front desk. Pt request cb when form is faxed. Faxed form to [phone] as requested.Fax confirmation received and pt notified as instructed.Copy sent for scanning.

## 2014-02-04 ENCOUNTER — Encounter (INDEPENDENT_AMBULATORY_CARE_PROVIDER_SITE_OTHER): Payer: BC Managed Care – PPO

## 2014-02-04 DIAGNOSIS — I10 Essential (primary) hypertension: Secondary | ICD-10-CM

## 2014-02-20 ENCOUNTER — Other Ambulatory Visit: Payer: Self-pay | Admitting: Cardiovascular Disease

## 2014-02-28 ENCOUNTER — Other Ambulatory Visit: Payer: Self-pay | Admitting: Cardiovascular Disease

## 2014-03-02 ENCOUNTER — Other Ambulatory Visit: Payer: Self-pay

## 2014-03-02 MED ORDER — PHENYTOIN SODIUM EXTENDED 100 MG PO CAPS: ORAL_CAPSULE | ORAL | Status: DC

## 2014-03-02 NOTE — Telephone Encounter (Signed)
Last filled 12/01/2013 with 2 refills--please advise

## 2014-03-05 ENCOUNTER — Ambulatory Visit: Payer: BC Managed Care – PPO | Admitting: Family Medicine

## 2014-03-05 ENCOUNTER — Encounter: Payer: Self-pay | Admitting: Internal Medicine

## 2014-03-05 ENCOUNTER — Ambulatory Visit (INDEPENDENT_AMBULATORY_CARE_PROVIDER_SITE_OTHER): Payer: BC Managed Care – PPO | Admitting: Internal Medicine

## 2014-03-05 VITALS — BP 138/98 | HR 80 | Temp 98.5°F | Resp 16 | Ht 74.0 in | Wt 258.5 lb

## 2014-03-05 DIAGNOSIS — Z87898 Personal history of other specified conditions: Secondary | ICD-10-CM

## 2014-03-05 DIAGNOSIS — R0989 Other specified symptoms and signs involving the circulatory and respiratory systems: Secondary | ICD-10-CM

## 2014-03-05 DIAGNOSIS — G4733 Obstructive sleep apnea (adult) (pediatric): Secondary | ICD-10-CM

## 2014-03-05 DIAGNOSIS — J069 Acute upper respiratory infection, unspecified: Secondary | ICD-10-CM

## 2014-03-05 DIAGNOSIS — I10 Essential (primary) hypertension: Secondary | ICD-10-CM

## 2014-03-05 DIAGNOSIS — G4730 Sleep apnea, unspecified: Secondary | ICD-10-CM

## 2014-03-05 DIAGNOSIS — Z8669 Personal history of other diseases of the nervous system and sense organs: Secondary | ICD-10-CM

## 2014-03-05 DIAGNOSIS — B9789 Other viral agents as the cause of diseases classified elsewhere: Secondary | ICD-10-CM

## 2014-03-05 DIAGNOSIS — F513 Sleepwalking [somnambulism]: Secondary | ICD-10-CM

## 2014-03-05 MED ORDER — PREDNISONE 10 MG PO TABS: 10.0000 mg | ORAL_TABLET | Freq: Every day | ORAL | Status: DC

## 2014-03-05 MED ORDER — SILVER SULFADIAZINE 1 % EX CREA: 1.0000 "application " | TOPICAL_CREAM | Freq: Every day | CUTANEOUS | Status: DC

## 2014-03-05 MED ORDER — TRAMADOL HCL 50 MG PO TABS: 50.0000 mg | ORAL_TABLET | Freq: Three times a day (TID) | ORAL | Status: DC | PRN

## 2014-03-05 NOTE — Progress Notes (Signed)
Pre-visit discussion using our clinic review tool. No additional management support is needed unless otherwise documented below in the visit note.  

## 2014-03-05 NOTE — Patient Instructions (Addendum)
1) You have a viral syndrome   I am prescribing a prednisone taper to manage the inflammation in your sinuses and ears   I also advise use of the following OTC meds to help with your other symptoms.   Take generic OTC benadryl 25 mg  At bedtime for the drainage,,  Delsym for daytime cough. flush your sinuses twice daily with Simply Saline   Afrin nasal spray twice daily for a maximum of 5 days   2) Your elevated blood pressure may be due to several causes:   Ibuprofen   Pheochromocytoma (a rare tumor on the adrenal gland that produces hormones that cause your symptoms and hypertension)   Untreated sleep apnea   Your renal arteries are fine; there is no evidence of blockage  Abstain from ibuprofen for a few weeks, especially while you take the prednisone taper for your sinus congestion  Use tramadol and tylenol as needed for pain  Start the lisinopril at 10mg  daily and increase to 20 mg daily after one week if BP is not < 130/80  Referral to sleep specialist for the sleep walking and sleep apnea  24 hour urine collection for the test to rule out pheochromocytoma

## 2014-03-05 NOTE — Progress Notes (Signed)
Patient ID: Thomas Cole, male   DOB: 07-08-1970, 43 y.o.   MRN: [id number]  Patient Active Problem List   Diagnosis Date Noted  . Viral URI with cough 03/06/2014  . Obesity (BMI 30-39.9) 11/11/2013  . Migraines 11/11/2013  . Asthma, mild intermittent 11/11/2013  . History of seizures 11/11/2013  . GERD (gastroesophageal reflux disease) 11/11/2013  . Depression 11/11/2013  . Labile hypertension 03/19/2013  . Cervical spondylosis without myelopathy 09/17/2012  . Sleep apnea 05/25/2010  . DYSPNEA 05/25/2010    Subjective:  CC:   Chief Complaint  Patient presents with  . Cough    congestion    HPI:  2 i Thomas Cole is a 43 y.o. male who presents for follow up on acute and chronic issues:  Cough and congestion since Sunday .  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  Has not had evaluati on since then, changed occupations.  Notes difficulty discerning conversation if there is any ambient noise.  Some popping of left ear.  . Has chronic headaches due to history of cervical fractures after falling on ice in 2010.     2) Uncontrolled hypertension despite addition of several medications. Diastolic pressures are frequently > 100.  He is frustrated , wants answers  Including resutls of recent Renal artery ultrasound .  Has episodes of elevated blood pressure accompanied by flushing.  Has not submitted urine collection for the catechoalmine study.  Also has untreated  OSA ;  Untreated due to frequent epiodes of sleep walking which lead to interruption of CPAP .  Sleep study was done in 2013 .  Feels tired all the time     Past Medical History  Diagnosis Date  . Hypertension   . Migraine headache   . Asthma   . Back problem   . Anxiety   . GERD (gastroesophageal reflux  disease)   . Complication of anesthesia     occ takes longer to wake  . Shortness of breath   . Pneumonia     can not use cpap  . Seizures     last 87  . Depression     Past Surgical History  Procedure Laterality Date  . Knee surgery      bilateral  . Posterior fusion cervical spine    . Cervical disc surgery    . Appendectomy  12  . Anterior cervical corpectomy N/A 09/15/2012    Procedure: Cervical Three-Four,Cervical Six-Seven Anterior cervical decompression/diskectomy fusion with Cervical Four Corpectomy;  Surgeon: Kristeen Miss, MD;  Location: [facility];  Service: Neurosurgery;  Laterality: N/A;  Cervical Three-Four,Cervical Six-Seven  Anterior cervical decompression/diskectomy fusion with Cervical four Corpectomy       The following portions of the patient's history were reviewed and updated as appropriate: Allergies, current medications, and problem list.    Review of Systems:   Patient denies headache, fevers, malaise, unintentional weight loss, skin rash, eye pain, sinus congestion and sinus pain, sore throat, dysphagia,  hemoptysis , cough, dyspnea, wheezing, chest pain, palpitations, orthopnea, edema, abdominal pain, nausea, melena, diarrhea, constipation, flank pain, dysuria, hematuria, urinary  Frequency, nocturia, numbness, tingling, seizures,  Focal weakness, Loss of consciousness,  Tremor, insomnia, depression, anxiety, and suicidal ideation.  History   Social History  . Marital Status: Single    Spouse Name: N/A    Number of Children: N/A  . Years of Education: N/A   Occupational History  . Not on file.   Social History Main Topics  . Smoking status: Never Smoker   . Smokeless tobacco: Never Used  . Alcohol Use: No  . Drug Use: No  . Sexual Activity: Yes   Other Topics Concern  . Not on file   Social History Narrative    Objective:  Filed Vitals:   03/05/14 1552  BP: 138/98  Pulse: 80  Temp: 98.5 F (36.9 C)  Resp: 16      General appearance: alert, cooperative and appears stated age Ears: normal TM's and external ear canals both ears Throat: lips, mucosa, and tongue normal; teeth and gums normal Neck: no adenopathy, no carotid bruit, supple, symmetrical, trachea midline and thyroid not enlarged, symmetric, no tenderness/mass/nodules Back: symmetric, no curvature. ROM normal. No CVA tenderness. Lungs: clear to auscultation bilaterally Heart: regular rate and rhythm, S1, S2 normal, no murmur, click, rub or gallop Abdomen: soft, non-tender; bowel sounds normal; no masses,  no organomegaly Pulses: 2+ and symmetric Skin: Skin color, texture, turgor normal. No rashes or lesions Lymph nodes: Cervical, supraclavicular, and axillary nodes normal.  Assessment and Plan:  Labile hypertension Workup for secondary causes  Underway.  Uses ibuprofen frquently die to chronic headaches. RAS ruled out with recent ultrasound,  Untreated OSA is a contributor.  24 hr urine collection for catecholamines and metanephrines  Advised to rule out pheochromocytoma.  No signs of proteinuria today. Suspend NSAIDs.  Lab Results  Component Value Date   MICROALBUR 0.4 03/05/2014   Lab Results  Component Value Date   CREATININE 1.0 11/11/2013     Sleep apnea Currently untreated due to patient's frequent somnambulatory behavior. However after discussion today he is motivated to ry again. Referral to sleep specialist offered and accepted.  Dr  Asencion Partridge Dohmeier.   History of seizures patient has been taking dilantin for over 20 years and his last seizure was in 1987.  Etiology unclear due to chronicity.  Will consider discontinuing dilantin pending neurology evaluation.   Viral URI with cough URI is most likely viral given the mild HEENT  Symptoms  And normal exam.   I have explained that in viral URIS, an antibiotic will not help the symptoms and will increase the risk of developing diarrhea.,  Continue oral and nasal  decongestants, and tylenol 650 mq 8 hrs for aches and pains,  And will prednisone  taper for inflammation    Updated Medication List Outpatient Encounter Prescriptions as of 03/05/2014  Medication Sig  . albuterol (PROVENTIL HFA;VENTOLIN HFA) 108 (90 BASE) MCG/ACT inhaler Inhale 2 puffs into the lungs every 6 (six) hours as needed for wheezing or shortness of breath.  Marland Kitchen amLODipine (NORVASC) 10 MG tablet TAKE 1 TABLET (10 MG TOTAL) BY MOUTH DAILY.  . hydrALAZINE (APRESOLINE) 10 MG tablet Take 1 tablet (10 mg total) by mouth 3 (three) times daily as needed (for BP fluctuations).  . hydrochlorothiazide (HYDRODIURIL) 25 MG tablet Take 1 tablet (25 mg total) by mouth daily.  . nitroGLYCERIN (NITROSTAT) 0.4 MG SL tablet Place 1 tablet (0.4 mg total) under the tongue every 5 (five) minutes as needed.  Marland Kitchen omeprazole (PRILOSEC) 20 MG capsule TAKE ONE CAPSULE BY MOUTH TWICE A DAY  . phenytoin (DILANTIN) 100 MG ER capsule TAKE 2 CAPSULES BY MOUTH IN THE MORNING AND  TAKE 3 CAPSULES IN THE EVENING AS DIRECTED  . lisinopril (PRINIVIL,ZESTRIL) 10 MG tablet Take 1 tablet (10 mg total) by mouth daily. (Patient not taking: Reported on 03/05/2014)  . predniSONE (DELTASONE) 10 MG tablet Take 1 tablet (10 mg total) by mouth daily with breakfast. 6 tablets on Day 1 , then reduce by 1 tablet daily until gone  . silver sulfADIAZINE (SILVADENE) 1 % cream Apply 1 application topically daily.  . traMADol (ULTRAM) 50 MG tablet Take 1 tablet (50 mg total) by mouth every 8 (eight) hours as needed.     Orders Placed This Encounter  Procedures  . Metanephrines, Urine, 24 hour  . Urine Microalbumin w/creat. ratio  . Ambulatory referral to Neurology    Return in about 1 month (around 04/05/2014).

## 2014-03-06 LAB — MICROALBUMIN / CREATININE URINE RATIO
Creatinine, Urine: 79.3 mg/dL
MICROALB UR: 0.4 mg/dL (ref <2.0)
Microalb Creat Ratio: 5 mg/g (ref 0.0–30.0)

## 2014-03-06 NOTE — Assessment & Plan Note (Addendum)
Workup for secondary causes  Underway.  RAS ruled out with recent ultrasound,  Untreated OSA is a contributor.  24 hr urine collection for catecholamines and metanephrines  Advised to rule out pheochromocytoma.  No signs of proteinuria today.  Lab Results  Component Value Date   MICROALBUR 0.4 03/05/2014   Lab Results  Component Value Date   CREATININE 1.0 11/11/2013

## 2014-03-06 NOTE — Assessment & Plan Note (Signed)
URI is most likely viral given the mild HEENT  Symptoms  And normal exam.   I have explained that in viral URIS, an antibiotic will not help the symptoms and will increase the risk of developing diarrhea.,  Continue oral and nasal decongestants, and tylenol 650 mq 8 hrs for aches and pains,  And will prednisone  taper for inflammation 

## 2014-03-06 NOTE — Assessment & Plan Note (Signed)
patient has been taking dilantin for over 20 years and his last seizure was in 1987.  Etiology unclear due to chronicity.  Will consider discontinuing dilantin pending neurology evaluation.

## 2014-03-06 NOTE — Assessment & Plan Note (Signed)
Currently untreated due to patient's frequent somnambulatory behavior. However after discussion today he is motivated to ry again. Referral to sleep specialist offered and accepted.  Dr  Asencion Partridge Dohmeier.

## 2014-03-08 ENCOUNTER — Telehealth: Payer: Self-pay | Admitting: Internal Medicine

## 2014-03-08 NOTE — Telephone Encounter (Signed)
emmi emailed °

## 2014-04-08 ENCOUNTER — Ambulatory Visit: Payer: BC Managed Care – PPO | Admitting: Nurse Practitioner

## 2014-04-15 ENCOUNTER — Ambulatory Visit: Payer: BC Managed Care – PPO | Admitting: Nurse Practitioner

## 2014-04-29 ENCOUNTER — Ambulatory Visit (INDEPENDENT_AMBULATORY_CARE_PROVIDER_SITE_OTHER): Payer: No Typology Code available for payment source | Admitting: Nurse Practitioner

## 2014-04-29 ENCOUNTER — Encounter (INDEPENDENT_AMBULATORY_CARE_PROVIDER_SITE_OTHER): Payer: Self-pay

## 2014-04-29 ENCOUNTER — Encounter: Payer: Self-pay | Admitting: Nurse Practitioner

## 2014-04-29 VITALS — BP 138/82 | HR 75 | Temp 98.1°F | Resp 12 | Ht 74.0 in | Wt 262.8 lb

## 2014-04-29 DIAGNOSIS — F329 Major depressive disorder, single episode, unspecified: Secondary | ICD-10-CM

## 2014-04-29 DIAGNOSIS — F32A Depression, unspecified: Secondary | ICD-10-CM

## 2014-04-29 DIAGNOSIS — H9193 Unspecified hearing loss, bilateral: Secondary | ICD-10-CM

## 2014-04-29 DIAGNOSIS — F4329 Adjustment disorder with other symptoms: Secondary | ICD-10-CM

## 2014-04-29 NOTE — Progress Notes (Signed)
Subjective:    Patient ID: Thomas Cole, male    DOB: 1970/12/26, 44 y.o.   MRN: [id number]  HPI  Thomas Cole is a 44 yo male with a CC of HTN.   1) Norvasc 10 mg tablet, HCTZ 25 mg, Lisinopril 10 mg- not taking, hydralazine 10 mg Not  150-160 down to 110-115  Staying tired all the time, not sleeping well Nightmares and sleep walking with increasing  Sleep study in past shows OSA Waking up frequently  Consistent routine before bed Daytime somnolence   Hearing decreased in a noisy room    Review of Systems  Constitutional: Positive for fatigue. Negative for fever, chills and diaphoresis.  HENT:       Hearing decreased  Eyes: Negative for visual disturbance.  Respiratory: Negative for chest tightness and wheezing.   Cardiovascular: Negative for chest pain, palpitations and leg swelling.  Gastrointestinal: Negative for nausea.  Musculoskeletal: Negative for myalgias.  Skin: Negative for rash.  Neurological: Negative for dizziness, weakness and numbness.       Headaches from neck pain.   Psychiatric/Behavioral: Positive for sleep disturbance. Negative for suicidal ideas and self-injury. The patient is not nervous/anxious.    Past Medical History  Diagnosis Date  . Hypertension   . Migraine headache   . Asthma   . Back problem   . Anxiety   . GERD (gastroesophageal reflux disease)   . Complication of anesthesia     occ takes longer to wake  . Shortness of breath   . Pneumonia     can not use cpap  . Seizures     last 87  . Depression     History   Social History  . Marital Status: Single    Spouse Name: N/A    Number of Children: N/A  . Years of Education: N/A   Occupational History  . Not on file.   Social History Main Topics  . Smoking status: Never Smoker   . Smokeless tobacco: Never Used  . Alcohol Use: No  . Drug Use: No  . Sexual Activity: Yes   Other Topics Concern  . Not on file   Social History Narrative    Past Surgical History    Procedure Laterality Date  . Knee surgery      bilateral  . Posterior fusion cervical spine    . Cervical disc surgery    . Appendectomy  12  . Anterior cervical corpectomy N/A 09/15/2012    Procedure: Cervical Three-Four,Cervical Six-Seven Anterior cervical decompression/diskectomy fusion with Cervical Four Corpectomy;  Surgeon: Kristeen Miss, MD;  Location: [facility];  Service: Neurosurgery;  Laterality: N/A;  Cervical Three-Four,Cervical Six-Seven  Anterior cervical decompression/diskectomy fusion with Cervical four Corpectomy    Family History  Problem Relation Age of Onset  . Coronary artery disease Mother   . Hypertension Mother   . Mental illness Mother   . Arthritis Mother   . Coronary artery disease Father   . Heart disease Father   . Arthritis Father   . Arthritis Maternal Grandmother   . Heart disease Maternal Grandmother   . Hypertension Maternal Grandmother   . Arthritis Maternal Grandfather   . Heart disease Maternal Grandfather   . Hypertension Maternal Grandfather   . Arthritis Paternal Grandmother   . Heart disease Paternal Grandmother   . Hypertension Paternal Grandmother   . Arthritis Paternal Grandfather   . Heart disease Paternal Grandfather   . Hypertension Paternal Grandfather   . Cancer  Neg Hx   . Diabetes Neg Hx   . Stroke Neg Hx     Allergies  Allergen Reactions  . Bee Venom Itching and Swelling  . Chlorhexidine Itching    REACTION TO WIPES/ CLOTHES ONLY==he can tolerate the SCRUB LIQUID  . Carbamazepine Itching and Rash  . Meloxicam Itching and Rash  . Sulfa Antibiotics Itching and Rash    Current Outpatient Prescriptions on File Prior to Visit  Medication Sig Dispense Refill  . albuterol (PROVENTIL HFA;VENTOLIN HFA) 108 (90 BASE) MCG/ACT inhaler Inhale 2 puffs into the lungs every 6 (six) hours as needed for wheezing or shortness of breath. 1 Inhaler 11  . amLODipine (NORVASC) 10 MG tablet TAKE 1 TABLET (10 MG TOTAL) BY MOUTH DAILY. 90  tablet 3  . hydrALAZINE (APRESOLINE) 10 MG tablet Take 1 tablet (10 mg total) by mouth 3 (three) times daily as needed (for BP fluctuations). 30 tablet 0  . hydrochlorothiazide (HYDRODIURIL) 25 MG tablet Take 1 tablet (25 mg total) by mouth daily. 90 tablet 1  . nitroGLYCERIN (NITROSTAT) 0.4 MG SL tablet Place 1 tablet (0.4 mg total) under the tongue every 5 (five) minutes as needed. 25 tablet 12  . omeprazole (PRILOSEC) 20 MG capsule TAKE ONE CAPSULE BY MOUTH TWICE A DAY 60 capsule 3  . phenytoin (DILANTIN) 100 MG ER capsule TAKE 2 CAPSULES BY MOUTH IN THE MORNING AND TAKE 3 CAPSULES IN THE EVENING AS DIRECTED 150 capsule 2  . silver sulfADIAZINE (SILVADENE) 1 % cream Apply 1 application topically daily. 50 g 0  . traMADol (ULTRAM) 50 MG tablet Take 1 tablet (50 mg total) by mouth every 8 (eight) hours as needed. 90 tablet 3  . lisinopril (PRINIVIL,ZESTRIL) 10 MG tablet Take 1 tablet (10 mg total) by mouth daily. (Patient not taking: Reported on 04/29/2014) 30 tablet 2  . predniSONE (DELTASONE) 10 MG tablet Take 1 tablet (10 mg total) by mouth daily with breakfast. 6 tablets on Day 1 , then reduce by 1 tablet daily until gone (Patient not taking: Reported on 04/29/2014) 21 tablet 0   No current facility-administered medications on file prior to visit.       Objective:   Physical Exam  Constitutional: He is oriented to person, place, and time. He appears well-developed and well-nourished. No distress.  BP 138/82 mmHg  Pulse 75  Temp(Src) 98.1 F (36.7 C) (Oral)  Resp 12  Ht 6\' 2"  (1.88 m)  Wt 262 lb 12.8 oz (119.205 kg)  BMI 33.73 kg/m2  SpO2 97%   HENT:  Head: Normocephalic and atraumatic.  Right Ear: External ear normal.  Left Ear: External ear normal.  Neck: Normal range of motion. No thyromegaly present.  Cardiovascular: Normal rate, regular rhythm, normal heart sounds and intact distal pulses.  Exam reveals no gallop and no friction rub.   No murmur heard. Pulmonary/Chest:  Effort normal and breath sounds normal. No respiratory distress. He has no wheezes. He has no rales. He exhibits no tenderness.  Musculoskeletal: Normal range of motion. He exhibits no edema or tenderness.  Neurological: He is alert and oriented to person, place, and time.  Skin: Skin is warm and dry. No rash noted. He is not diaphoretic.  Psychiatric: He has a normal mood and affect. His behavior is normal. Judgment and thought content normal.      Assessment & Plan:

## 2014-04-29 NOTE — Patient Instructions (Addendum)
Please complete the urine 24 hour collection if able.   We will contact you about your referrals.   We will follow up in 2 months.

## 2014-04-30 NOTE — Assessment & Plan Note (Signed)
Stable. Pt is concerned about his hearing being decreased in both ears. Will refer to Audiology.

## 2014-04-30 NOTE — Assessment & Plan Note (Signed)
See depression
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
## 2014-05-13 ENCOUNTER — Institutional Professional Consult (permissible substitution): Payer: Self-pay | Admitting: Neurology

## 2014-05-20 ENCOUNTER — Encounter: Payer: Self-pay | Admitting: Neurology

## 2014-05-20 ENCOUNTER — Ambulatory Visit (INDEPENDENT_AMBULATORY_CARE_PROVIDER_SITE_OTHER): Payer: No Typology Code available for payment source | Admitting: Neurology

## 2014-05-20 VITALS — BP 134/85 | HR 76 | Resp 18 | Ht 74.0 in | Wt 262.0 lb

## 2014-05-20 DIAGNOSIS — G4719 Other hypersomnia: Secondary | ICD-10-CM

## 2014-05-20 DIAGNOSIS — R561 Post traumatic seizures: Secondary | ICD-10-CM

## 2014-05-20 DIAGNOSIS — R519 Headache, unspecified: Secondary | ICD-10-CM

## 2014-05-20 DIAGNOSIS — F513 Sleepwalking [somnambulism]: Secondary | ICD-10-CM

## 2014-05-20 DIAGNOSIS — G4733 Obstructive sleep apnea (adult) (pediatric): Secondary | ICD-10-CM

## 2014-05-20 DIAGNOSIS — R51 Headache: Secondary | ICD-10-CM
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
## 2014-05-20 NOTE — Patient Instructions (Signed)
Sleep Apnea  Sleep apnea is disorder that affects a person's sleep. A person with sleep apnea has abnormal pauses in their breathing when they sleep. It is hard for them to get a good sleep. This makes a person tired during the day. It also can lead to other physical problems. There are three types of sleep apnea. One type is when breathing stops for a short time because your airway is blocked (obstructive sleep apnea). Another type is when the brain sometimes fails to give the normal signal to breathe to the muscles that control your breathing (central sleep apnea). The third type is a combination of the other two types.  HOME CARE  · Do not sleep on your back. Try to sleep on your side.  · Take all medicine as told by your doctor.  · Avoid alcohol, calming medicines (sedatives), and depressant drugs.  · Try to lose weight if you are overweight. Talk to your doctor about a healthy weight goal.  Your doctor may have you use a device that helps to open your airway. It can help you get the air that you need. It is called a positive airway pressure (PAP) device. There are three types of PAP devices:  · Continuous positive airway pressure (CPAP) device.  · Nasal expiratory positive airway pressure (EPAP) device.  · Bilevel positive airway pressure (BPAP) device.  MAKE SURE YOU:  · Understand these instructions.  · Will watch your condition.  · Will get help right away if you are not doing well or get worse.  Document Released: 12/27/2007 Document Revised: 03/05/2012 Document Reviewed: 07/21/2011  ExitCare® Patient Information ©2015 [employer]. This information is not intended to replace advice given to you by your health care provider. Make sure you discuss any questions you have with your health care provider.

## 2014-05-21 LAB — PHENYTOIN LEVEL, FREE: Phenytoin, Free: 0.8 ug/mL — ABNORMAL LOW (ref 1.0–2.0)

## 2014-05-26 ENCOUNTER — Other Ambulatory Visit: Payer: Self-pay | Admitting: Internal Medicine

## 2014-05-27 NOTE — Telephone Encounter (Signed)
Electronic refill request.  CPE:  01/13/14   Last Filled:    150 capsule 2 RF on 03/02/2014  Please advise.

## 2014-05-27 NOTE — Telephone Encounter (Signed)
Call pt. Is he switching providers to [facility]?

## 2014-05-31 ENCOUNTER — Telehealth: Payer: Self-pay | Admitting: Neurology

## 2014-05-31 NOTE — Telephone Encounter (Signed)
Patient is returning call. Please call back at [phone]

## 2014-05-31 NOTE — Telephone Encounter (Signed)
Thomas Cole, patient was seen by Dr. Brett Fairy May 20 2014 for sleep disorder, I do not see his note concerning his seizure in Epic system, please find out who was his previous neurologist, he has not compliant with his medications, needs to see neurologist for his seizure, if needed, can put him on my schedule,

## 2014-05-31 NOTE — Telephone Encounter (Signed)
Attempted call back - left another message.

## 2014-05-31 NOTE — Telephone Encounter (Signed)
Left message for pt to return my call.

## 2014-05-31 NOTE — Telephone Encounter (Signed)
-----   Message from Larey Seat, MD sent at 05/31/2014 11:48 AM EST -----    DR Krista Blue, Please review low Dilantin.      ----- Message -----    From: [facility] Lab Results In Interface    Sent: 05/21/2014   4:41 PM      To: Larey Seat, MD

## 2014-06-01 ENCOUNTER — Telehealth: Payer: Self-pay | Admitting: Nurse Practitioner

## 2014-06-01 ENCOUNTER — Telehealth: Payer: Self-pay

## 2014-06-01 ENCOUNTER — Other Ambulatory Visit: Payer: Self-pay | Admitting: Nurse Practitioner

## 2014-06-01 NOTE — Telephone Encounter (Signed)
Patient is seeing Lorane Gell as his PCP.  Refill request forwarded.

## 2014-06-01 NOTE — Telephone Encounter (Signed)
Spoke to patient - his seizures are being managed by Lorane Gell, NP at [facility].  I informed him of his labs and he will call her today to schedule an appt.  I have faxed over his labwork to her attention.

## 2014-06-01 NOTE — Telephone Encounter (Signed)
Received call from pt and he stated he was told by his neurologist that his dilantin levels were low.  Pt wanted to know why we had not filled his phenytoin medication.  Pt informed me the fax of his levels were being sent to our office.  I told pt I would check into things and call him back.  Notified NP and confirmed with pharmacy Rx was received.  Called pt back and left message the Rx was filled by [facility] and was ready.

## 2014-06-01 NOTE — Telephone Encounter (Signed)
Patient requests Thomas Cole.  Refill request forwarded.

## 2014-06-01 NOTE — Telephone Encounter (Signed)
Left detailed message on voicemail to give the PCP information to the receptionist or leave a message on my VM.

## 2014-06-01 NOTE — Telephone Encounter (Signed)
Patient returning call, requesting a return call after 1 pm today @ [phone].

## 2014-06-01 NOTE — Telephone Encounter (Signed)
Left detailed message on voicemail to return call with provider info.

## 2014-06-01 NOTE — Telephone Encounter (Signed)
Received phone call from patient.  Pt stated he had labs drawn at his neurological appointment and was told his dilantin level was low.  Pt asked why we have not filled his medication and that he felt he was getting the run around because the neurology office was faxing his labs over.  Pt sounded agitated.  I told patient I would check into things and call him back.  Spoke with NP and confirmed Rx was sent to pharmacy.

## 2014-06-01 NOTE — Telephone Encounter (Signed)
Attempted to contact - left message to call back.

## 2014-06-01 NOTE — Telephone Encounter (Signed)
Pt called a second time and left voice mail.  Pt confirmed receiving previous message about medication and now asks what we are going to do about his dilantin level.  Informed NP.  Called pt back and left a voice mail to schedule a follow up  appointment with NP or the neurology clinic who has been seeing him and prescribing the Rx so that his levels are followed on a regular basis.

## 2014-06-01 NOTE — Telephone Encounter (Signed)
Pt returned your call. Please call back at your convenience. Thanks.

## 2014-06-02 NOTE — Telephone Encounter (Signed)
Called Pharmacy refilled already by [facility]. Denisa called pt to let him know to pick it up.

## 2014-06-02 NOTE — Telephone Encounter (Signed)
I am not PCP, I didn't act on this.

## 2014-06-03 ENCOUNTER — Ambulatory Visit (INDEPENDENT_AMBULATORY_CARE_PROVIDER_SITE_OTHER): Payer: No Typology Code available for payment source | Admitting: Nurse Practitioner

## 2014-06-03 ENCOUNTER — Encounter: Payer: Self-pay | Admitting: Nurse Practitioner

## 2014-06-03 VITALS — BP 140/82 | HR 78 | Temp 98.0°F | Resp 12 | Ht 74.0 in | Wt 266.4 lb

## 2014-06-03 DIAGNOSIS — Z79899 Other long term (current) drug therapy: Secondary | ICD-10-CM

## 2014-06-03 DIAGNOSIS — Z8669 Personal history of other diseases of the nervous system and sense organs: Secondary | ICD-10-CM

## 2014-06-03 DIAGNOSIS — Z5181 Encounter for therapeutic drug level monitoring: Secondary | ICD-10-CM

## 2014-06-03 DIAGNOSIS — Z87898 Personal history of other specified conditions: Secondary | ICD-10-CM

## 2014-06-03 NOTE — Patient Instructions (Signed)
Phenytoin capsules and extended-release capsules What is this medicine? PHENYTOIN (FEN i toyn) is used to control seizures in certain types of epilepsy. It is also used to prevent seizures during or after surgery. This medicine may be used for other purposes; ask your health care provider or pharmacist if you have questions. COMMON BRAND NAME(S): Dilantin, Phenytek What should I tell my health care provider before I take this medicine? They need to know if you have any of these conditions: -an alcohol abuse problem -Asian ancestry -blood disorders or disease -diabetes -heart problems -kidney disease -liver disease -porphyria -receiving radiation therapy -suicidal thoughts, plans, or attempt; a previous suicide attempt by you or a family member -thyroid disease -an unusual or allergic reaction to phenytoin, other medicines, foods, dyes, or preservatives -pregnant or trying to get pregnant -breast-feeding How should I use this medicine? Take this medicine by mouth with a glass of water. Follow the directions on the prescription label. If you are taking extended-release capsules, swallow them whole. Do not crush or chew. Take this medicine with food if it upsets your stomach. It may be best to take your medicine consistently with or without food. Take your doses at regular intervals. Do not take your medicine more often than directed. Do not stop taking this medicine suddenly. This increases the risk of seizures. Your doctor will tell you how much medicine to take. If your doctor wants you to stop the medicine, the dose will be slowly lowered over time to avoid any side effects. Talk to your pediatrician regarding the use of this medicine in children. Special care may be needed. Overdosage: If you think you have taken too much of this medicine contact a poison control center or emergency room at once. NOTE: This medicine is only for you. Do not share this medicine with others. What if I miss a  dose? If you miss a dose, take it as soon as you can. If it is almost time for your next dose, take only that dose. Do not take double or extra doses. What may interact with this medicine? Do not take this medicine with any of the following medications: -delavirdine -ibrutinib -ranolazine This medicine may also interact with the following medications: -albendazole -alcohol -antiviral medicines for HIV or AIDS -aspirin and aspirin-like medicines -certain medicines for blood pressure like nifedipine, nimodipine, and verapamil -certain medicines for cancer -certain medicines for cholesterol like atorvastatin, simvastatin, and fluvastatin -certain medicines for depression, anxiety, or psychotic disturbances -certain medicines for fungal infections like ketoconazole and itraconazole -certain medicines for irregular heart beat like amiodarone and quinidine -certain medicines for seizures like carbamazepine, phenobarbital, and topiramate -certain medicines for stomach problems like cimetidine and omeprazole -chloramphenicol -cyclosporine -diazoxide -digoxin -disulfiram -doxycycline -male hormones, like estrogens and birth control pills -furosemide -halothane -isoniazid -medicines that relax muscles for surgery -methylphenidate -narcotic medicines for pain -phenothiazines like chlorpromazine, mesoridazine, prochlorperazine, thioridazine -praziquantel -reserpine -rifampin -St. John's Wort -steroid medicines like prednisone or cortisone -sulfonamides like sulfamethoxazole or sulfasalazine -supplements like folic acid or vitamin D -theophylline -ticlopidine -tolbutamide -warfarin This list may not describe all possible interactions. Give your health care provider a list of all the medicines, herbs, non-prescription drugs, or dietary supplements you use. Also tell them if you smoke, drink alcohol, or use illegal drugs. Some items may interact with your medicine. What should I  watch for while using this medicine? Visit your doctor or health care professional for regular checks on your progress. This medicine needs careful monitoring. Your doctor or   health care professional may schedule regular blood tests. Wear a medical ID bracelet or chain, and carry a card that describes your disease and details of your medicine and dosage times. Do not change brands or dosage forms of this medicine without discussing the change with your doctor or health care professional. Dennis Bast may get drowsy or dizzy. Do not drive, use machinery, or do anything that needs mental alertness until you know how this medicine affects you. Do not stand or sit up quickly, especially if you are an older patient. This reduces the risk of dizzy or fainting spells. Alcohol may interfere with the effect of this medicine. Avoid alcoholic drinks. Birth control pills may not work properly while you are taking this medicine. Talk to your doctor about using an extra method of birth control. This medicine can cause unusual growth of gum tissues. Visit your dentist regularly. Problems can arise if you need dental work, and in the day to day care of your teeth. Try to avoid damage to your teeth and gums when you brush or floss your teeth. Do not take antacids at the same time as this medicine. If you get an upset stomach and want to take an antacid or medicine for diarrhea, make sure there is an interval of 2 to 3 hours before or after you took your phenytoin. The use of this medicine may increase the chance of suicidal thoughts or actions. Pay special attention to how you are responding while on this medicine. Any worsening of mood, or thoughts of suicide or dying should be reported to your health care professional right away. Women who become pregnant while using this medicine may enroll in the [place] Pregnancy Registry by calling [phone]. This registry collects information about the safety of  antiepileptic drug use during pregnancy. What side effects may I notice from receiving this medicine? Side effects that you should report to your doctor or health care professional as soon as possible: -allergic reactions like skin rash, itching or hives, swelling of the face, lips, or tongue -breathing problems -changes in vision -chest pain or tightness -confusion -dark yellow or brown urine -fast or irregular heartbeat -fever, sore throat -headache -loss of seizure control -poor control of body movements or difficulty walking -redness, blistering, peeling or loosening of the skin, including inside the mouth -unusual bleeding or bruising, pinpoint red spots on skin -vomiting -worsening of mood, thoughts or actions of suicide or dying -yellowing of the eyes or skin Side effects that usually do not require medical attention (report to your doctor or health care professional if they continue or are bothersome): -constipation -difficulty sleeping -excessive hair growth on the face or body -nausea This list may not describe all possible side effects. Call your doctor for medical advice about side effects. You may report side effects to FDA at 1-800-FDA-1088. Where should I keep my medicine? Keep out of the reach of children. Store at room temperature between 15 and 30 degrees C (59 and 86 degrees F). Protect from light and moisture. Throw away any unused medicine after the expiration date. NOTE: This sheet is a summary. It may not cover all possible information. If you have questions about this medicine, talk to your doctor, pharmacist, or health care provider.  2015, Elsevier/Gold Standard. (2012-10-13 15:07:05)

## 2014-06-03 NOTE — Progress Notes (Signed)
Pre visit review using our clinic review tool, if applicable. No additional management support is needed unless otherwise documented below in the visit note. 

## 2014-06-03 NOTE — Assessment & Plan Note (Signed)
No recent Neurology consults. He was seen previously for sleep disturbances by Dr. Brett Fairy. She recommended following up with Neurology (he reports) and staying on his Phenytoin because his seizures have not recurred. I asked him what he would like and we talked about options. The conclusion we came to was to re-check the level and stay on Phenytoin because he is dealing with other issues currently. If he ever wants to discuss going off of seizure medications I will refer him back to neurology. Otherwise I will help with maintaining safe levels, refills, and DMV forms as needed. He is agreeable to this. Will follow.
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
## 2014-06-04 LAB — PHENYTOIN LEVEL, TOTAL: PHENYTOIN LVL: 15.7 ug/mL (ref 10.0–20.0)

## 2014-06-04 NOTE — Progress Notes (Signed)
I agree with assessment and plan as outlined  Skyann Ganim, MD

## 2014-07-01 ENCOUNTER — Telehealth: Payer: Self-pay | Admitting: *Deleted

## 2014-07-01 ENCOUNTER — Ambulatory Visit (INDEPENDENT_AMBULATORY_CARE_PROVIDER_SITE_OTHER): Payer: No Typology Code available for payment source | Admitting: Nurse Practitioner

## 2014-07-01 ENCOUNTER — Encounter: Payer: Self-pay | Admitting: Nurse Practitioner

## 2014-07-01 VITALS — BP 146/96 | HR 96 | Temp 97.4°F | Resp 12 | Ht 74.0 in | Wt 249.4 lb

## 2014-07-01 DIAGNOSIS — I10 Essential (primary) hypertension: Secondary | ICD-10-CM

## 2014-07-01 DIAGNOSIS — R0989 Other specified symptoms and signs involving the circulatory and respiratory systems: Secondary | ICD-10-CM

## 2014-07-01 MED ORDER — OLMESARTAN MEDOXOMIL 20 MG PO TABS: 20.0000 mg | ORAL_TABLET | Freq: Every day | ORAL | Status: DC

## 2014-07-01 MED ORDER — HYDROCHLOROTHIAZIDE 12.5 MG PO TABS
12.5000 mg | ORAL_TABLET | Freq: Every day | ORAL | Status: DC
Start: 2014-07-01 — End: 2014-08-11

## 2014-07-01 NOTE — Progress Notes (Signed)
Pre visit review using our clinic review tool, if applicable. No additional management support is needed unless otherwise documented below in the visit note. 

## 2014-07-01 NOTE — Progress Notes (Signed)
Subjective:    Patient ID: Thomas Cole, male    DOB: 11-Jan-1971, 44 y.o.   MRN: [id number]  HPI  Thomas Cole is a 44 yo male here for a 2 month follow up of sleep apnea, fatigue, and phenytoin levels.   1) Pt has no complaints today, but willing to discuss BP.  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 Reports he has seen Cardiology in the past and he was controlled on multiple medications. He would like to see if he can try different combinations because his current 3 make him tired.   Taking Amlodipine 10 mg daily, HCTZ 25 mg daily and Hydralazine 10 mg daily. Can extra hydralazine (up to 3 a day) for higher numbers. Not taking the lisinopril. Did not like the way it made him feel.   Review of Systems  Constitutional: Negative for fever, chills, diaphoresis and fatigue.  Eyes: Negative for visual disturbance.  Respiratory: Negative for chest tightness, shortness of breath and wheezing.   Cardiovascular: Negative for chest pain, palpitations and leg swelling.  Gastrointestinal: Negative for nausea, vomiting and diarrhea.  Skin: Negative for rash.  Neurological: Positive for headaches. Negative for dizziness, weakness and numbness.  Psychiatric/Behavioral: Positive for sleep disturbance. The patient is not nervous/anxious.    Past Medical History  Diagnosis Date  . Hypertension   . Migraine headache   . Asthma   . Back problem   . Anxiety   . GERD (gastroesophageal reflux disease)   . Complication of anesthesia     occ takes longer to wake  . Shortness of breath   . Pneumonia     can not use cpap  . Seizures     last 86  . Depression     History   Social History  . Marital Status: Single    Spouse Name: N/A  . Number of Children: 0  . Years of Education: college   Occupational History  .      [employer]   Social History Main Topics  . Smoking status: Never Smoker   . Smokeless tobacco: Never Used  . Alcohol Use: No  . Drug Use: No  . Sexual Activity: Yes    Other Topics Concern  . Not on file   Social History Narrative   Patient lives at home alone and he single.    Patient works full time for [employer].   Education college.   Right handed.   Caffeine one coke cola in the mornings.    Past Surgical History  Procedure Laterality Date  . Knee surgery      bilateral  . Posterior fusion cervical spine    . Cervical disc surgery    . Appendectomy  11  . Anterior cervical corpectomy N/A 09/15/2012    Procedure: Cervical Three-Four,Cervical Six-Seven Anterior cervical decompression/diskectomy fusion with Cervical Four Corpectomy;  Surgeon: Kristeen Miss, MD;  Location: [facility];  Service: Neurosurgery;  Laterality: N/A;  Cervical Three-Four,Cervical Six-Seven  Anterior cervical decompression/diskectomy fusion with Cervical four Corpectomy    Family History  Problem Relation Age of Onset  . Coronary artery disease Mother   . Hypertension Mother   . Mental illness Mother   . Arthritis Mother   . Coronary artery disease Father   . Heart disease Father   . Arthritis Father   . Arthritis Maternal Grandmother   . Heart disease Maternal Grandmother   . Hypertension Maternal Grandmother   . Arthritis Maternal Grandfather   .  Heart disease Maternal Grandfather   . Hypertension Maternal Grandfather   . Arthritis Paternal Grandmother   . Heart disease Paternal Grandmother   . Hypertension Paternal Grandmother   . Arthritis Paternal Grandfather   . Heart disease Paternal Grandfather   . Hypertension Paternal Grandfather   . Cancer Neg Hx   . Diabetes Neg Hx   . Stroke Neg Hx     Allergies  Allergen Reactions  . Bee Venom Itching and Swelling  . Chlorhexidine Itching    REACTION TO WIPES/ CLOTHES ONLY==he can tolerate the SCRUB LIQUID  . Carbamazepine Itching and Rash  . Meloxicam Itching and Rash  . Sulfa Antibiotics Itching and Rash    Current Outpatient Prescriptions on File Prior to Visit  Medication Sig Dispense  Refill  . albuterol (PROVENTIL HFA;VENTOLIN HFA) 108 (90 BASE) MCG/ACT inhaler Inhale 2 puffs into the lungs every 6 (six) hours as needed for wheezing or shortness of breath. 1 Inhaler 11  . hydrALAZINE (APRESOLINE) 10 MG tablet Take 1 tablet (10 mg total) by mouth 3 (three) times daily as needed (for BP fluctuations). 30 tablet 0  . nitroGLYCERIN (NITROSTAT) 0.4 MG SL tablet Place 1 tablet (0.4 mg total) under the tongue every 5 (five) minutes as needed. 25 tablet 12  . omeprazole (PRILOSEC) 20 MG capsule TAKE ONE CAPSULE BY MOUTH TWICE A DAY 60 capsule 3  . phenytoin (DILANTIN) 100 MG ER capsule TAKE 2 CAPSULES BY MOUTH IN THE MORNING AND TAKE 3 CAPSULES IN THE EVENING AS DIRECTED 150 capsule 2  . traMADol (ULTRAM) 50 MG tablet Take 1 tablet (50 mg total) by mouth every 8 (eight) hours as needed. 90 tablet 3   No current facility-administered medications on file prior to visit.      Objective:   Physical Exam  Constitutional: He is oriented to person, place, and time. He appears well-developed and well-nourished. No distress.  BP 146/96 mmHg  Pulse 96  Temp(Src) 97.4 F (36.3 C) (Oral)  Resp 12  Ht 6\' 2"  (1.88 m)  Wt 249 lb 6.4 oz (113.127 kg)  BMI 32.01 kg/m2  SpO2 97%   HENT:  Head: Normocephalic and atraumatic.  Right Ear: External ear normal.  Left Ear: External ear normal.  Cardiovascular: Normal rate, regular rhythm, normal heart sounds and intact distal pulses.  Exam reveals no gallop and no friction rub.   No murmur heard. Pulmonary/Chest: Effort normal and breath sounds normal. No respiratory distress. He has no wheezes. He has no rales. He exhibits no tenderness.  Neurological: He is alert and oriented to person, place, and time.  Skin: Skin is warm and dry. No rash noted. He is not diaphoretic.  Psychiatric: He has a normal mood and affect. His behavior is normal. Judgment and thought content normal.      Assessment & Plan:

## 2014-07-01 NOTE — Patient Instructions (Signed)
Try the HCTZ 12.5 mg   Add the Benicar 20 mg if not working (may need Prior Authorization from insurance take hydralazine if not avail).  Follow up in 1 month- keep a record of BP, pulse, and how you feel.

## 2014-07-01 NOTE — Telephone Encounter (Signed)
Fax from [facility], needing PA for Benicar. Started online, pending response.

## 2014-07-01 NOTE — Assessment & Plan Note (Signed)
Will work on finding a tolerated combination of BP medications. Pt wants to start with just HCTZ 12.5 mg and then will try Benicar 20 mg. Asked him to take the hydralazine if BP starts to get above 150/100. Keep records and FU in 1 month.

## 2014-07-05 NOTE — Telephone Encounter (Signed)
PA for Benicar denied. Insurance stated medical necessity criteria not met. Provider notified.

## 2014-07-07 ENCOUNTER — Telehealth: Payer: Self-pay

## 2014-07-07 NOTE — Telephone Encounter (Signed)
Spoke with patient yesterday and he is aware of his insurance not covering the Benicar prescription.  Patient states he has stopped taking the amlodipine and will begin the HCTZ medication as soon as possible.  Pt said he feels fine and will monitor his BP at home.  Encouraged patient to call with any questions or concerns.

## 2014-07-15 ENCOUNTER — Ambulatory Visit: Payer: BC Managed Care – PPO | Admitting: Internal Medicine

## 2014-07-21 ENCOUNTER — Other Ambulatory Visit: Payer: Self-pay | Admitting: Internal Medicine

## 2014-08-05 ENCOUNTER — Ambulatory Visit (INDEPENDENT_AMBULATORY_CARE_PROVIDER_SITE_OTHER): Payer: No Typology Code available for payment source | Admitting: Nurse Practitioner

## 2014-08-05 ENCOUNTER — Encounter: Payer: Self-pay | Admitting: Nurse Practitioner

## 2014-08-05 VITALS — BP 140/100 | HR 75 | Temp 98.4°F | Resp 12 | Ht 74.0 in | Wt 272.0 lb

## 2014-08-05 DIAGNOSIS — R0989 Other specified symptoms and signs involving the circulatory and respiratory systems: Secondary | ICD-10-CM

## 2014-08-05 DIAGNOSIS — M7989 Other specified soft tissue disorders: Secondary | ICD-10-CM

## 2014-08-05 DIAGNOSIS — I10 Essential (primary) hypertension: Secondary | ICD-10-CM

## 2014-08-05 DIAGNOSIS — Z8669 Personal history of other diseases of the nervous system and sense organs: Secondary | ICD-10-CM

## 2014-08-05 DIAGNOSIS — R0602 Shortness of breath: Secondary | ICD-10-CM

## 2014-08-05 DIAGNOSIS — Z87898 Personal history of other specified conditions: Secondary | ICD-10-CM

## 2014-08-05 LAB — COMPREHENSIVE METABOLIC PANEL
ALT: 19 U/L (ref 0–53)
AST: 20 U/L (ref 0–37)
Albumin: 4.3 g/dL (ref 3.5–5.2)
Alkaline Phosphatase: 85 U/L (ref 39–117)
BILIRUBIN TOTAL: 0.3 mg/dL (ref 0.2–1.2)
BUN: 18 mg/dL (ref 6–23)
CALCIUM: 9.4 mg/dL (ref 8.4–10.5)
CHLORIDE: 104 meq/L (ref 96–112)
CO2: 29 meq/L (ref 19–32)
Creatinine, Ser: 1.04 mg/dL (ref 0.40–1.50)
GFR: 82.19 mL/min (ref >60.00)
Glucose, Bld: 90 mg/dL (ref 70–99)
Potassium: 4 mEq/L (ref 3.5–5.1)
SODIUM: 138 meq/L (ref 135–145)
Total Protein: 7.3 g/dL (ref 6.0–8.3)

## 2014-08-05 LAB — BRAIN NATRIURETIC PEPTIDE: Pro B Natriuretic peptide (BNP): 15 pg/mL (ref 0.0–100.0)

## 2014-08-05 MED ORDER — FUROSEMIDE 20 MG PO TABS: 20.0000 mg | ORAL_TABLET | Freq: Every day | ORAL | Status: DC

## 2014-08-05 NOTE — Progress Notes (Signed)
Subjective:    Patient ID: Thomas Cole, male    DOB: 04-30-1970, 44 y.o.   MRN: [id number]  HPI  Thomas Cole is 44 yo male here for a follow up of hypertension.   1) Noticing leg swelling and increased congestion. Feels less flushing with being off of the other medications, he feels even and not like his BP is spiking.  Breathing getting heavy again when working out  Sitting down feels worse, also  PTSD is more frequent recently, he is going to the counselor soon that I referred him to.   Review of Systems  Constitutional: Negative for fever, chills, diaphoresis and fatigue.  Respiratory: Positive for chest tightness and shortness of breath. Negative for wheezing.   Cardiovascular: Positive for leg swelling. Negative for chest pain and palpitations.  Gastrointestinal: Negative for nausea, vomiting and diarrhea.  Skin: Negative for rash.   Past Medical History  Diagnosis Date  . Hypertension   . Migraine headache   . Asthma   . Back problem   . Anxiety   . GERD (gastroesophageal reflux disease)   . Complication of anesthesia     occ takes longer to wake  . Shortness of breath   . Pneumonia     can not use cpap  . Seizures     last 87  . Depression     History   Social History  . Marital Status: Single    Spouse Name: N/A  . Number of Children: 0  . Years of Education: college   Occupational History  .      [employer]   Social History Main Topics  . Smoking status: Never Smoker   . Smokeless tobacco: Never Used  . Alcohol Use: No  . Drug Use: No  . Sexual Activity: Yes   Other Topics Concern  . Not on file   Social History Narrative   Patient lives at home alone and he single.    Patient works full time for [employer].   Education college.   Right handed.   Caffeine one coke cola in the mornings.    Past Surgical History  Procedure Laterality Date  . Knee surgery      bilateral  . Posterior fusion cervical spine    . Cervical disc surgery     . Appendectomy  12  . Anterior cervical corpectomy N/A 09/15/2012    Procedure: Cervical Three-Four,Cervical Six-Seven Anterior cervical decompression/diskectomy fusion with Cervical Four Corpectomy;  Surgeon: Kristeen Miss, MD;  Location: [facility];  Service: Neurosurgery;  Laterality: N/A;  Cervical Three-Four,Cervical Six-Seven  Anterior cervical decompression/diskectomy fusion with Cervical four Corpectomy    Family History  Problem Relation Age of Onset  . Coronary artery disease Mother   . Hypertension Mother   . Mental illness Mother   . Arthritis Mother   . Coronary artery disease Father   . Heart disease Father   . Arthritis Father   . Arthritis Maternal Grandmother   . Heart disease Maternal Grandmother   . Hypertension Maternal Grandmother   . Arthritis Maternal Grandfather   . Heart disease Maternal Grandfather   . Hypertension Maternal Grandfather   . Arthritis Paternal Grandmother   . Heart disease Paternal Grandmother   . Hypertension Paternal Grandmother   . Arthritis Paternal Grandfather   . Heart disease Paternal Grandfather   . Hypertension Paternal Grandfather   . Cancer Neg Hx   . Diabetes Neg Hx   . Stroke Neg Hx  Allergies  Allergen Reactions  . Bee Venom Itching and Swelling  . Chlorhexidine Itching    REACTION TO WIPES/ CLOTHES ONLY==he can tolerate the SCRUB LIQUID  . Carbamazepine Itching and Rash  . Meloxicam Itching and Rash  . Sulfa Antibiotics Itching and Rash    Current Outpatient Prescriptions on File Prior to Visit  Medication Sig Dispense Refill  . albuterol (PROVENTIL HFA;VENTOLIN HFA) 108 (90 BASE) MCG/ACT inhaler Inhale 2 puffs into the lungs every 6 (six) hours as needed for wheezing or shortness of breath. 1 Inhaler 11  . hydrALAZINE (APRESOLINE) 10 MG tablet Take 1 tablet (10 mg total) by mouth 3 (three) times daily as needed (for BP fluctuations). 30 tablet 0  . hydrochlorothiazide (HYDRODIURIL) 12.5 MG tablet Take 1  tablet (12.5 mg total) by mouth daily. 30 tablet 0  . nitroGLYCERIN (NITROSTAT) 0.4 MG SL tablet Place 1 tablet (0.4 mg total) under the tongue every 5 (five) minutes as needed. 25 tablet 12  . olmesartan (BENICAR) 20 MG tablet Take 1 tablet (20 mg total) by mouth daily. 30 tablet 0  . omeprazole (PRILOSEC) 20 MG capsule TAKE ONE CAPSULE BY MOUTH TWICE A DAY 60 capsule 3  . phenytoin (DILANTIN) 100 MG ER capsule TAKE 2 CAPSULES BY MOUTH IN THE MORNING AND TAKE 3 CAPSULES IN THE EVENING AS DIRECTED 150 capsule 2  . traMADol (ULTRAM) 50 MG tablet Take 1 tablet (50 mg total) by mouth every 8 (eight) hours as needed. 90 tablet 3   No current facility-administered medications on file prior to visit.      Objective:   Physical Exam  Constitutional: He is oriented to person, place, and time. He appears well-developed and well-nourished. No distress.  BP 140/100 mmHg  Pulse 75  Temp(Src) 98.4 F (36.9 C) (Oral)  Resp 12  Ht 6\' 2"  (1.88 m)  Wt 272 lb (123.378 kg)  BMI 34.91 kg/m2  SpO2 97%   HENT:  Head: Normocephalic and atraumatic.  Right Ear: External ear normal.  Left Ear: External ear normal.  Eyes: Right eye exhibits no discharge. Left eye exhibits no discharge. No scleral icterus.  Cardiovascular: Normal rate, regular rhythm, normal heart sounds and intact distal pulses.  Exam reveals no gallop and no friction rub.   No murmur heard. Pulmonary/Chest: Effort normal. No respiratory distress. He has no wheezes. He has no rales. He exhibits no tenderness.  Decreased breath sounds all over,   Neurological: He is alert and oriented to person, place, and time.  Skin: Skin is warm and dry. No rash noted. He is not diaphoretic.  Psychiatric: He has a normal mood and affect. His behavior is normal. Judgment and thought content normal.      Assessment & Plan:

## 2014-08-05 NOTE — Patient Instructions (Addendum)
Sent in Lasix take as needed for SOB, take one only as needed.   Check blood pressures and record.   Follow up in 1 month.   Visit lab before leaving today.

## 2014-08-05 NOTE — Progress Notes (Signed)
Pre visit review using our clinic review tool, if applicable. No additional management support is needed unless otherwise documented below in the visit note. 

## 2014-08-06 ENCOUNTER — Encounter: Payer: Self-pay | Admitting: *Deleted

## 2014-08-06 DIAGNOSIS — R0602 Shortness of breath: Secondary | ICD-10-CM | POA: Insufficient documentation

## 2014-08-06 HISTORY — DX: Shortness of breath: R06.02

## 2014-08-06 LAB — PHENYTOIN LEVEL, TOTAL: PHENYTOIN LVL: 12.4 ug/mL (ref 10.0–20.0)

## 2014-08-06 NOTE — Assessment & Plan Note (Signed)
Check dilantin level today

## 2014-08-06 NOTE — Assessment & Plan Note (Signed)
HCTZ stable on. Lasix 20 mg prn for SOB and leg swelling. FU in 1 month. EKG was negative for significant findings.

## 2014-08-06 NOTE — Assessment & Plan Note (Signed)
BP still uncontrolled on just HCTZ, will add occasional use of Lasix for congested/SOB feeling and ankle swelling. CMET obtain today. EKG was normal (slight QRS widening, same as in past).

## 2014-08-11 ENCOUNTER — Other Ambulatory Visit: Payer: Self-pay | Admitting: Nurse Practitioner

## 2014-09-02 ENCOUNTER — Ambulatory Visit (INDEPENDENT_AMBULATORY_CARE_PROVIDER_SITE_OTHER): Payer: No Typology Code available for payment source | Admitting: Nurse Practitioner

## 2014-09-02 ENCOUNTER — Encounter: Payer: Self-pay | Admitting: Nurse Practitioner

## 2014-09-02 VITALS — BP 124/82 | HR 79 | Temp 97.8°F | Resp 14 | Ht 74.0 in | Wt 272.4 lb

## 2014-09-02 DIAGNOSIS — R0602 Shortness of breath: Secondary | ICD-10-CM

## 2014-09-02 DIAGNOSIS — F4329 Adjustment disorder with other symptoms: Secondary | ICD-10-CM | POA: Diagnosis not present

## 2014-09-02 DIAGNOSIS — R0600 Dyspnea, unspecified: Secondary | ICD-10-CM | POA: Diagnosis not present

## 2014-09-02 MED ORDER — PHENYTOIN SODIUM EXTENDED 100 MG PO CAPS: ORAL_CAPSULE | ORAL | Status: DC

## 2014-09-02 MED ORDER — TRAMADOL HCL 50 MG PO TABS: 50.0000 mg | ORAL_TABLET | Freq: Three times a day (TID) | ORAL | Status: DC | PRN

## 2014-09-02 NOTE — Progress Notes (Signed)
Pre visit review using our clinic review tool, if applicable. No additional management support is needed unless otherwise documented below in the visit note. 

## 2014-09-02 NOTE — Patient Instructions (Signed)
Follow up in 3 months

## 2014-09-02 NOTE — Progress Notes (Signed)
Subjective:    Patient ID: Thomas Cole, male    DOB: 24-Nov-1970, 44 y.o.   MRN: [id number]  HPI  Thomas Cole is a 44 yo male here for a follow up of HTN.  1) HTN is improving, Lasix makes him feel much better and feels he exercises better when he takes it. Potassium 4.0 last visit. His trainer notices a difference when he takes his Lasix. He has been worked up by cardiology in past with no findings. He feels his chest is heavy when he does not take Lasix. BNP was normal at last visit. Pt reports since childhood medications have a paradoxical effect especially with benadryl, caffeine, stimulants ect... He also notices drugs that help him for a few weeks tend to fade in efficacy over time.   EKG was normal last visit   Review of Systems  Constitutional: Negative for fever, chills, diaphoresis and fatigue.  Eyes: Negative for visual disturbance.  Respiratory: Positive for chest tightness and shortness of breath. Negative for wheezing.        Improves with Lasix and HCTZ  Cardiovascular: Negative for chest pain, palpitations and leg swelling.  Gastrointestinal: Negative for nausea, vomiting and diarrhea.  Endocrine: Negative for polydipsia, polyphagia and polyuria.  Neurological: Negative for dizziness, weakness and numbness.  Psychiatric/Behavioral: The patient is not nervous/anxious.        Still having vivid dreams and PTSD   Past Medical History  Diagnosis Date  . Hypertension   . Migraine headache   . Asthma   . Back problem   . Anxiety   . GERD (gastroesophageal reflux disease)   . Complication of anesthesia     occ takes longer to wake  . Shortness of breath   . Pneumonia     can not use cpap  . Seizures     last 87  . Depression     History   Social History  . Marital Status: Single    Spouse Name: N/A  . Number of Children: 0  . Years of Education: college   Occupational History  .      [employer]   Social History Main Topics  . Smoking status: Never  Smoker   . Smokeless tobacco: Never Used  . Alcohol Use: No  . Drug Use: No  . Sexual Activity: Yes   Other Topics Concern  . Not on file   Social History Narrative   Patient lives at home alone and he single.    Patient works full time for [employer].   Education college.   Right handed.   Caffeine one coke cola in the mornings.    Past Surgical History  Procedure Laterality Date  . Knee surgery      bilateral  . Posterior fusion cervical spine    . Cervical disc surgery    . Appendectomy  12  . Anterior cervical corpectomy N/A 09/15/2012    Procedure: Cervical Three-Four,Cervical Six-Seven Anterior cervical decompression/diskectomy fusion with Cervical Four Corpectomy;  Surgeon: Kristeen Miss, MD;  Location: [facility];  Service: Neurosurgery;  Laterality: N/A;  Cervical Three-Four,Cervical Six-Seven  Anterior cervical decompression/diskectomy fusion with Cervical four Corpectomy    Family History  Problem Relation Age of Onset  . Coronary artery disease Mother   . Hypertension Mother   . Mental illness Mother   . Arthritis Mother   . Coronary artery disease Father   . Heart disease Father   . Arthritis Father   . Arthritis  Maternal Grandmother   . Heart disease Maternal Grandmother   . Hypertension Maternal Grandmother   . Arthritis Maternal Grandfather   . Heart disease Maternal Grandfather   . Hypertension Maternal Grandfather   . Arthritis Paternal Grandmother   . Heart disease Paternal Grandmother   . Hypertension Paternal Grandmother   . Arthritis Paternal Grandfather   . Heart disease Paternal Grandfather   . Hypertension Paternal Grandfather   . Cancer Neg Hx   . Diabetes Neg Hx   . Stroke Neg Hx   . Asthma Mother     Allergies  Allergen Reactions  . Bee Venom Itching and Swelling  . Chlorhexidine Itching    REACTION TO WIPES/ CLOTHES ONLY==he can tolerate the SCRUB LIQUID  . Carbamazepine Itching and Rash  . Meloxicam Itching and Rash  .  Sulfa Antibiotics Itching and Rash    Current Outpatient Prescriptions on File Prior to Visit  Medication Sig Dispense Refill  . albuterol (PROVENTIL HFA;VENTOLIN HFA) 108 (90 BASE) MCG/ACT inhaler Inhale 2 puffs into the lungs every 6 (six) hours as needed for wheezing or shortness of breath. 1 Inhaler 11  . furosemide (LASIX) 20 MG tablet Take 1 tablet (20 mg total) by mouth daily. (Patient taking differently: Take 20 mg by mouth daily as needed. ) 30 tablet 0  . hydrochlorothiazide (MICROZIDE) 12.5 MG capsule TAKE ONE CAPSULE BY MOUTH EVERY DAY 30 capsule 5  . nitroGLYCERIN (NITROSTAT) 0.4 MG SL tablet Place 1 tablet (0.4 mg total) under the tongue every 5 (five) minutes as needed. 25 tablet 12   No current facility-administered medications on file prior to visit.       Objective:   Physical Exam  Constitutional: He is oriented to person, place, and time. He appears well-developed and well-nourished. No distress.  BP 124/82 mmHg  Pulse 79  Temp(Src) 97.8 F (36.6 C) (Oral)  Resp 14  Ht 6\' 2"  (1.88 m)  Wt 272 lb 6.4 oz (123.56 kg)  BMI 34.96 kg/m2  SpO2 97%   HENT:  Head: Normocephalic and atraumatic.  Right Ear: External ear normal.  Left Ear: External ear normal.  Cardiovascular: Normal rate, regular rhythm and normal heart sounds.   Pulmonary/Chest: Effort normal and breath sounds normal. No respiratory distress. He has no wheezes. He has no rales. He exhibits no tenderness.  Abdominal: Soft. Bowel sounds are normal. He exhibits no distension and no mass. There is no tenderness. There is no rebound and no guarding.  Neurological: He is alert and oriented to person, place, and time.  Skin: Skin is warm and dry. No rash noted. He is not diaphoretic.  Psychiatric: He has a normal mood and affect. His behavior is normal. Judgment and thought content normal.      Assessment & Plan:

## 2014-09-16 ENCOUNTER — Ambulatory Visit (INDEPENDENT_AMBULATORY_CARE_PROVIDER_SITE_OTHER)
Admission: RE | Admit: 2014-09-16 | Discharge: 2014-09-16 | Disposition: A | Discharge status: Home / Self Care | Payer: 59 | Source: Ambulatory Visit | Attending: Internal Medicine | Admitting: Internal Medicine

## 2014-09-16 ENCOUNTER — Ambulatory Visit (INDEPENDENT_AMBULATORY_CARE_PROVIDER_SITE_OTHER): Payer: 59 | Admitting: Internal Medicine

## 2014-09-16 ENCOUNTER — Encounter: Payer: Self-pay | Admitting: Internal Medicine

## 2014-09-16 VITALS — BP 122/86 | HR 76 | Ht 74.0 in | Wt 274.0 lb

## 2014-09-16 DIAGNOSIS — R0600 Dyspnea, unspecified: Secondary | ICD-10-CM

## 2014-09-16 DIAGNOSIS — R0602 Shortness of breath: Secondary | ICD-10-CM

## 2014-09-16 MED ORDER — OMEPRAZOLE 20 MG PO CPDR: DELAYED_RELEASE_CAPSULE | ORAL | Status: DC

## 2014-09-16 MED ORDER — FAMOTIDINE 20 MG PO TABS: ORAL_TABLET | ORAL | Status: DC

## 2014-09-16 NOTE — Assessment & Plan Note (Signed)
-   [facility] eval with cpst 02/20/2008 exercise testing demonstrateed normal functional capabilities and a normal cardiopulmonary response to exercise. - 09/16/2014  Walked RA x 3 laps @ 185 ft each stopped due to end of study/ nl pace/ no desat  - spirometry 09/16/2014 > no obst/ min restriction   Symptoms are markedly disproportionate to objective findings and not clear this is a lung problem but pt does appear to have difficult airway management issues. DDX of  difficult airways management all start with A and  include Adherence, Ace Inhibitors, Acid Reflux, Active Sinus Disease, Alpha 1 Antitripsin deficiency, Anxiety masquerading as Airways dz,  ABPA,  allergy(esp in young), Aspiration (esp in elderly), Adverse effects of meds,  Active smokers, A bunch of PE's (a small clot burden can't cause this syndrome unless there is already severe underlying pulm or vascular dz with poor reserve) plus two Bs  = Bronchiectasis and Beta blocker use..and one C= CHF  ? Acid (or non-acid) GERD > always difficult to exclude as up to 75% of pts in some series report no assoc GI/ Heartburn symptoms> rec max (24h)  acid suppression and diet restrictions/ reviewed and instructions given in writing.   ? Allergy/ asthma > no evidence at all on today's spirometry done p ex with active symptoms/ no prev response to saba   ? chf > better on diuretic but note bnp only 15 so unlikley   ? Anxiety/ depression/ obesity/ deconditioning > best bet is first address this issue with max gerd rx/ reconditioning x 4 weeks then repeat cpst with spirometry before and after to compare to the prev study available from care everywhere from 02/2008   I had an extended discussion with the patient reviewing all relevant studies completed to date and  Lasting 47 m Each maintenance medication was reviewed in detail including most importantly the difference between maintenance and as needed and under what circumstances the prns are to be used.  Please  see instructions for details which were reviewed in writing and the patient given a copy.

## 2014-09-16 NOTE — Patient Instructions (Addendum)
Prilosec 20 mg Take 30- 60 min before your first and last meals of the day and Pepcid 20 mg *  GERD (REFLUX)  is an extremely common cause of respiratory symptoms just like yours , many times with no obvious heartburn at all.    It can be treated with medication, but also with lifestyle changes including elevation of the head of your bed (ideally with 6 inch  bed blocks),  Smoking cessation, avoidance of late meals, excessive alcohol, and avoid fatty foods, chocolate, peppermint, colas, red wine, and acidic juices such as orange juice.  NO MINT OR MENTHOL PRODUCTS SO NO COUGH DROPS  USE SUGARLESS CANDY INSTEAD (Jolley ranchers or Stover's or Life Savers) or even ice chips will also do - the key is to swallow to prevent all throat clearing. NO OIL BASED VITAMINS - use powdered substitutes.  Weight control is simply a matter of calorie balance which needs to be tilted in your favor by eating less and exercising more.  To get the most out of exercise, you need to be continuously aware that you are short of breath, but never out of breath, for 30 minutes daily. As you improve, it will actually be easier for you to do the same amount of exercise  in  30 minutes so always push to the level where you are short of breath.  If this does not result in gradual weight reduction then I strongly recommend you see a nutritionist with a food diary x 2 weeks so that we can work out a negative calorie balance which is universally effective in steady weight loss programs.  Think of your calorie balance like you do your bank account where in this case you want the balance to go down so you must take in less calories than you burn up.  It's just that simple:  Hard to do, but easy to understand.  Good luck!    Please remember to go to the  x-ray department downstairs for your tests - we will call you with the results when they are available.  If not doing better in 4 weeks call [facility] [phone] to schedule cpst *Late add  Prilosec should be 40 mg Take 30-60 min before first meal of the day and pepcid at bedtime

## 2014-09-16 NOTE — Progress Notes (Signed)
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    09/16/2014 1st [facility] office visit/ Krisi Azua   Chief Complaint  Patient presents with  . Pulmonary Consult    Referred by Lorane Gell, NP.  Pt c/o SOB "for years" worse over the past few months.  He states he gets SOB with or without any exertion. He notices it gets worse if he eats a large meal or is exposed to hot weather.  He sometimes gets out of breath just walking accross the room.    variable doe all his life previously attributed to asthma but not much better with albuterol,  Some better with hydrodiuril with labile hbp noted  Works out with trainer mostly wts/ 15 step ups will make him sob every time/ also dragging  trash cans to street 50 yards flat/ a little more when full vs empty Bending over esp doing massage will also do it but not supine/ sleeping. Some assoc chest tightness. Alread on ppi takes prilosec 20 bid not ac  No obvious other patterns in day to day or daytime variabilty or assoc chronic cough or cp or   subjective wheeze overt sinus or hb symptoms. No unusual exp hx or h/o childhood pna or knowledge of premature birth.  Sleeping ok without nocturnal  or early am exacerbation  of respiratory  c/o's or need for noct saba. Also denies any obvious fluctuation of symptoms with weather or environmental changes or other aggravating or alleviating factors except  as outlined above   Current Medications, Allergies, Complete Past Medical History, Past Surgical History, Family History, and Social History were reviewed in [employer] record.              Review of Systems  Constitutional: Negative for fever, chills, activity change, appetite change and unexpected weight change.  HENT: Negative for congestion, dental problem, postnasal drip, rhinorrhea, sneezing, sore throat, trouble swallowing and voice change.   Eyes: Negative for visual disturbance.  Respiratory: Positive for shortness of breath. Negative for cough and choking.   Cardiovascular: Positive for leg swelling. Negative for chest pain.  Gastrointestinal: Negative for nausea, vomiting and abdominal pain.  Genitourinary: Negative for difficulty urinating.  Musculoskeletal: Positive for arthralgias.  Skin: Negative for rash.  Psychiatric/Behavioral: Negative for behavioral problems and confusion.       Objective:   Physical Exam  amb somber wm nad having chest tightness at present with voice fatigue   Wt Readings from Last 3 Encounters:  09/16/14 274 lb (124.286 kg)  09/02/14 272 lb 6.4 oz (123.56 kg)  08/05/14 272 lb (123.378 kg)    Vital signs reviewed  HEENT: nl dentition, turbinates, and orophanx. Nl external ear canals without cough reflex   NECK :  without JVD/Nodes/TM/ nl carotid upstrokes bilaterally   LUNGS: no  acc muscle use, clear to A and P bilaterally without cough on insp or exp maneuvers   CV:  RRR  no s3 or murmur or increase in P2, no edema   ABD:  soft and nontender with nl excursion in the supine position. No bruits or organomegaly, bowel sounds nl  MS:  warm without deformities, calf tenderness, cyanosis or clubbing  SKIN: warm and dry without lesions    NEURO:  alert, approp, no deficits     CXR PA and Lateral:   09/16/2014 :     I personally reviewed images and agree with radiology impression as follows:      No  acute cardiopulmonary disease.  Labs reviewed    Chemistry      Component Value Date/Time   NA 138 08/05/2014 0855   K 4.0 08/05/2014 0855   CL 104 08/05/2014 0855   CO2 29 08/05/2014 0855   BUN 18 08/05/2014 0855   CREATININE 1.04 08/05/2014 0855      Component Value Date/Time   CALCIUM 9.4 08/05/2014 0855   ALKPHOS 85 08/05/2014 0855   AST 20 08/05/2014 0855   ALT 19 08/05/2014 0855   BILITOT 0.3 08/05/2014 0855       Lab Results  Component Value Date   PROBNP 15.0 08/05/2014           Assessment & Plan:

## 2014-09-17 ENCOUNTER — Telehealth: Payer: Self-pay | Admitting: Internal Medicine

## 2014-09-17 ENCOUNTER — Telehealth: Payer: Self-pay | Admitting: *Deleted

## 2014-09-17 NOTE — Progress Notes (Signed)
Quick Note:  LMTCB ______ 

## 2014-09-17 NOTE — Telephone Encounter (Signed)
Pt aware of rec's per MW as below Requesting cxr results:   Notes Recorded by Tanda Rockers, MD on 09/16/2014 at 5:35 PM Call pt: Reviewed cxr and no acute change so no change in recommendations made at ov  Nothing further needed.

## 2014-09-17 NOTE — Telephone Encounter (Signed)
Pt states he would like to know why you see the cxr differently than the radiologist that read it. He states he didn't feel that you listened to him in office yesterday. States he may find another pulmonologist in another office. MW please advise.

## 2014-09-17 NOTE — Telephone Encounter (Signed)
LMTCB

## 2014-09-17 NOTE — Telephone Encounter (Signed)
-----   Message from Tanda Rockers, MD sent at 09/16/2014  8:05 PM EDT ----- Let him know: Typo on the recs - i thought he was on another strength of prilosec > should say : #Late add Prilosec should be total of 40 mg Take 30-60 min before first meal of the day and pepcid 20 mg at bedtime - let him know also I was able to look at his last cpst in 2009 at [facility] which was nl so we have a good baseline to compare if not doing well on this plan

## 2014-09-17 NOTE — Telephone Encounter (Signed)
Pt returned call [phone]

## 2014-09-18 DIAGNOSIS — R0600 Dyspnea, unspecified: Secondary | ICD-10-CM | POA: Insufficient documentation

## 2014-09-18 NOTE — Assessment & Plan Note (Signed)
Patient is very anxious. Will send to pulmonology for work up to rule out pulmonary etiology. Will follow.

## 2014-09-18 NOTE — Assessment & Plan Note (Signed)
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 He has a lot of anxiety. I referred him to psychology at [facility] (pain psychologist) unsure if this has happened yet.

## 2014-09-18 NOTE — Addendum Note (Signed)
Addended by: Rubbie Battiest on: 09/18/2014 07:38 AM   Modules accepted: Level of Service

## 2014-09-20 NOTE — Telephone Encounter (Signed)
Left message I would be happy to talk to him about his cxr but what the radiologist is seeing is the result of the T10 injury already apparent on MRI from 2014 with expected degree of calcification/sclerosis - and I would be happy to forward the xray to his back specialist for a "second opinion" but no further xrays or back or lung are needed from my perspective.     In addition:  If he would like to be referred back to [facility] (who came up with no dx for same problem previously) I would be happy to do so.

## 2014-09-20 NOTE — Telephone Encounter (Signed)
lmtcb

## 2014-09-21 NOTE — Telephone Encounter (Signed)
Spoke w/ pt and made aware of below. He voiced understanding. He no longer wants to be seen in our office and is going to look for a "3rd opinion". He does not want a referral from MW. He refuses to see him any longer. Nothing further needed

## 2014-09-21 NOTE — Telephone Encounter (Signed)
Returned call to [facility] call after 1:30 pm [phone].

## 2014-09-27 ENCOUNTER — Institutional Professional Consult (permissible substitution): Payer: No Typology Code available for payment source | Admitting: Internal Medicine

## 2014-09-30 ENCOUNTER — Other Ambulatory Visit: Payer: Self-pay | Admitting: Nurse Practitioner

## 2014-10-14 ENCOUNTER — Ambulatory Visit: Payer: 59 | Admitting: Internal Medicine

## 2014-10-21 ENCOUNTER — Ambulatory Visit (INDEPENDENT_AMBULATORY_CARE_PROVIDER_SITE_OTHER): Payer: 59 | Admitting: Internal Medicine

## 2014-10-21 ENCOUNTER — Encounter: Payer: Self-pay | Admitting: Internal Medicine

## 2014-10-21 VITALS — BP 138/96 | HR 88 | Temp 98.0°F | Ht 75.0 in | Wt 275.0 lb

## 2014-10-21 DIAGNOSIS — R0602 Shortness of breath: Secondary | ICD-10-CM

## 2014-10-21 DIAGNOSIS — J986 Disorders of diaphragm: Secondary | ICD-10-CM

## 2014-10-21 DIAGNOSIS — E669 Obesity, unspecified: Secondary | ICD-10-CM | POA: Diagnosis not present

## 2014-10-21 NOTE — Assessment & Plan Note (Addendum)
-   [facility] eval with cpst 02/20/2008 exercise testing demonstrateed normal functional capabilities and a normal cardiopulmonary response to exercise. - 09/16/2014  Walked RA x 3 laps @ 185 ft each stopped due to end of study/ nl pace/ no desat  - spirometry 09/16/2014 > no obst/ min restriction   Symptoms are markedly disproportionate to objective findings and not clear this is a lung problem but pt does appear to have difficult airway management issues. I believe at this time his dyspnea is multifactorial: Obesity, deconditioning, obstructive sleep apnea, possible phrenic injury from C3-7 surgery the past. Review of his x-ray showed that he had a questionable nodule, at which point we can obtain a CT chest in 3-4 months.  Plan: -SNIF test to evaluate the diaphragm for possible hemiplegia/paralysis secondary to previous cervical neck injury and surgery -Diet and exercise as tolerated -Weight loss -Patient advised to to follow-up at [facility] neurology for further workup of sleep apnea, sleep disorder walking, and treatment. -Pulmonary function testing and 6 minute walk test -CT chest in 3-4 months if needed, will discuss the patient in follow-up visit: "Increasing density over the lower thoracic spine. This may represent sclerosis of the vertebral body associated with degenerative disc disease and/or DISH however given the increase from the prior exam, followup noncontrast chest CT is recommended for further assessment."   I spent 35 minutes reviewing dyspnea and pathophysiology of obstructive sleep apnea, hypertension, obesity and their impact on breathing mechanics with the patient.

## 2014-10-21 NOTE — Assessment & Plan Note (Signed)
OBESITY  Discussed importance of weight reduction.  Educated regarding limitation of  intake of greasy/fried foods.  Instructed on benefit of  a low-impact exercise program, starting slowly.  Discussed benefits of 30-45 minutes of some form of exercise daily as well as benefit of supervised exercise program.    

## 2014-10-21 NOTE — Patient Instructions (Signed)
Follow up with Dr. Stevenson Clinch in 6-8 weeks - SNIF test prior to follow up to assess for diaphragm paralysis - please follow up with your sleep specialist and perform your split night sleep study - cont with diet and wt loss - continue with blood pressure control - we will discuss a CT Chest at your next visit.  - full PFTs and 6MWT prior to follow up visit.

## 2014-10-21 NOTE — Progress Notes (Signed)
MRN# [id number] Thomas Cole 09-Jul-1970   CC: No chief complaint on file.     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  Variable doe all his life previously attributed to asthma but not much better with albuterol, Some better with hydrodiuril with labile hbp noted  Works out with trainer mostly wts/ 15 step ups will make him sob every time/ also dragging trash cans to street 50 yards flat/ a little more when full vs empty Bending over esp doing massage will also do it but not supine/ sleeping. Some assoc chest tightness. Already on ppi takes prilosec 20 bid not ac  No obvious other patterns in day to day or daytime variabilty or assoc chronic cough or cp or subjective wheeze overt sinus or hb symptoms. No unusual exp hx or h/o childhood pna or knowledge of premature birth.  Sleeping ok without nocturnal or early am exacerbation of respiratory c/o's or need for noct saba. Also denies any obvious fluctuation of symptoms with weather or environmental changes or other aggravating or alleviating factors except as outlined above   Events since last clinic visit: Patient presents today for follow-up visit of dyspnea. He is actually transitioning care from [facility] to [facility]. Patient states overall since his last visit his breathing has become a little improved, he has started a gym and is trying to work out 3 times per week. He still endorses dyspnea on exertion and intermittently at  rest. She states he had a spinal injury secondary to accidental fall on ice back in 2009 that required surgery. He had C3-7 surgery in 2014. Again he endorses shortness of breath worse with weight gain and with heavy meals, worsening over the last 2 years, somewhat correlating with his cervical spine surgery. He is currently on hydrochlorothiazide for hypertension and pedal edema. Patient stated that he follow-up with [facility] neurology in the past for sleep apnea, but had to stop using CPAP because he is a sleep Eddie, sleep eater, and would take his CPAP off at night. I reviewed the chart while the patient was in the room and noticed that there is a split-night study ordered for him since February 2016 she has not completed. Patient stated that he will follow up with neurology again for his sleep disorder and obstructive sleep apnea.    Medication:   Current Outpatient Rx  Name  Route  Sig  Dispense  Refill  . albuterol (PROVENTIL HFA;VENTOLIN HFA) 108 (90 BASE) MCG/ACT inhaler   Inhalation   Inhale 2 puffs into the lungs every 6 (six) hours as needed for wheezing or shortness of breath.   1 Inhaler   11   . famotidine (PEPCID) 20 MG tablet      One at bedtime   30 tablet   11   . furosemide (LASIX) 20 MG tablet      TAKE 1 TABLET (20 MG TOTAL) BY MOUTH DAILY.   30 tablet   5   .  hydrochlorothiazide (MICROZIDE) 12.5 MG capsule      TAKE ONE CAPSULE BY MOUTH EVERY DAY   30 capsule   5   . omeprazole (PRILOSEC) 20 MG capsule      Take x 2 x 30 m before bfast         . phenytoin (DILANTIN) 100 MG ER capsule      TAKE 2 CAPSULES BY MOUTH IN THE MORNING AND TAKE 3 CAPSULES IN THE EVENING AS DIRECTED   150 capsule   2   . traMADol (ULTRAM) 50 MG tablet   Oral   Take 1 tablet (50 mg total) by mouth every 8 (eight) hours as needed.   90 tablet   0      Review of Systems: Gen:  Denies  fever, sweats, chills HEENT: Denies blurred vision, double vision, ear pain,  eye pain, hearing loss, nose bleeds, sore throat Cvc:  No dizziness, chest pain or heaviness Resp:   Admits to: Gi: Denies swallowing difficulty, stomach pain, nausea or vomiting, diarrhea, constipation, bowel incontinence Gu:  Denies bladder incontinence, burning urine Ext:   No Joint pain, stiffness or swelling Skin: No skin rash, easy bruising or bleeding or hives Endoc:  No polyuria, polydipsia , polyphagia or weight change Other:  All other systems negative  Allergies:  Bee venom; Chlorhexidine; Carbamazepine; Meloxicam; and Sulfa antibiotics  Physical Examination:  VS: BP 138/96 mmHg  Pulse 88  Temp(Src) 98 F (36.7 C) (Oral)  Ht 6\' 3"  (1.905 m)  Wt 275 lb (124.739 kg)  BMI 34.37 kg/m2  SpO2 95%  General Appearance: No distress  HEENT: PERRLA, no ptosis, no other lesions noticed Pulmonary:normal breath sounds., diaphragmatic excursion normal.No wheezing, No rales   Cardiovascular:  Normal S1,S2.  No m/r/g.     Abdomen:Exam: Benign, Soft, non-tender, No masses  Skin:   warm, no rashes, no ecchymosis  Extremities: normal, no cyanosis, clubbing, warm with normal capillary refill.      Rad results: (The following images and results were reviewed by Dr. Stevenson Clinch). CXR 09/16/14 FINDINGS: Cardiopericardial silhouette within normal limits. Mediastinal contours normal. Trachea midline. No airspace disease or effusion. Lower cervical ACDF is present.  Increased density is present over the lower thoracic spine on the lateral view. This is more pronounced than on the prior exams. Some of this may be projectional however a pulmonary nodule or mass lesion cannot be excluded.  IMPRESSION: 1. No acute cardiopulmonary disease. 2. Increasing density over the lower thoracic spine. This may represent sclerosis of the vertebral body associated with degenerative disc disease and/or DISH however given the increase from the prior exam, followup noncontrast chest CT is recommended for  further assessment. Underlying pulmonary nodule cannot be excluded radiographically.    Assessment and Plan: 44 year old male seen in follow-up for chronic dyspnea on exertion DYSPNEA - [facility] eval with cpst 02/20/2008 exercise testing demonstrateed normal functional capabilities and a normal cardiopulmonary response to exercise. - 09/16/2014  Walked RA x 3 laps @ 185 ft each stopped due to end of study/ nl pace/ no desat  - spirometry 09/16/2014 > no obst/ min restriction   Symptoms are markedly disproportionate to objective findings and not clear this is a lung problem but pt does appear to have difficult airway management issues. I believe at this time his dyspnea is multifactorial: Obesity, deconditioning, obstructive sleep apnea, possible phrenic injury from C3-7 surgery the past. Review of his x-ray showed that he had a questionable nodule, at which point we can obtain  a CT chest in 3-4 months.  Plan: -SNIF test to evaluate the diaphragm for possible hemiplegia/paralysis secondary to previous cervical neck injury and surgery -Diet and exercise as tolerated -Weight loss -Patient advised to to follow-up at [facility] neurology for further workup of sleep apnea, sleep disorder walking, and treatment. -Pulmonary function testing and 6 minute walk test -CT chest in 3-4 months if needed, will discuss the patient in follow-up visit: "Increasing density over the lower thoracic spine. This may represent sclerosis of the vertebral body associated with degenerative disc disease and/or DISH however given the increase from the prior exam, followup noncontrast chest CT is recommended for further assessment."   I spent 35 minutes reviewing dyspnea and pathophysiology of obstructive sleep apnea, hypertension, obesity and their impact on breathing mechanics with the patient.    Obesity (BMI 30-39.9) OBESITY  Discussed importance of weight reduction.  Educated regarding limitation of  intake of  greasy/fried foods.  Instructed on benefit of  a low-impact exercise program, starting slowly.  Discussed benefits of 30-45 minutes of some form of exercise daily as well as benefit of supervised exercise program.        Updated Medication List Outpatient Encounter Prescriptions as of 10/21/2014  Medication Sig  . albuterol (PROVENTIL HFA;VENTOLIN HFA) 108 (90 BASE) MCG/ACT inhaler Inhale 2 puffs into the lungs every 6 (six) hours as needed for wheezing or shortness of breath.  . famotidine (PEPCID) 20 MG tablet One at bedtime  . furosemide (LASIX) 20 MG tablet TAKE 1 TABLET (20 MG TOTAL) BY MOUTH DAILY.  . hydrochlorothiazide (MICROZIDE) 12.5 MG capsule TAKE ONE CAPSULE BY MOUTH EVERY DAY  . omeprazole (PRILOSEC) 20 MG capsule Take x 2 x 30 m before bfast  . phenytoin (DILANTIN) 100 MG ER capsule TAKE 2 CAPSULES BY MOUTH IN THE MORNING AND TAKE 3 CAPSULES IN THE EVENING AS DIRECTED  . traMADol (ULTRAM) 50 MG tablet Take 1 tablet (50 mg total) by mouth every 8 (eight) hours as needed.  . [DISCONTINUED] nitroGLYCERIN (NITROSTAT) 0.4 MG SL tablet Place 1 tablet (0.4 mg total) under the tongue every 5 (five) minutes as needed. (Patient not taking: Reported on 10/21/2014)   No facility-administered encounter medications on file as of 10/21/2014.    Orders for this visit: Orders Placed This Encounter  Procedures  . DG Sniff Test    Standing Status: Future     Number of Occurrences:      Standing Expiration Date: 12/22/2015    Scheduling Instructions:     Schedule in the couple of weeks    Order Specific Question:  Reason for Exam (SYMPTOM  OR DIAGNOSIS REQUIRED)    Answer:  diaphragm paralysis    Order Specific Question:  Preferred imaging location?    Answer:  [facility]  . Pulmonary function test    Standing Status: Future     Number of Occurrences:      Standing Expiration Date: 10/21/2015    Scheduling Instructions:     To be scheduled in [place] in 6-8 weeks with f/u.     Order Specific Question:  Where should this test be performed?    Answer:  [facility]    Order Specific Question:  Full PFT: includes the following: basic spirometry, spirometry pre & post bronchodilator, diffusion capacity (DLCO), lung volumes    Answer:  Full PFT    Order Specific Question:  MIP/MEP    Answer:  No    Order Specific Question:  6 minute walk  Answer:  Yes    Order Specific Question:  ABG    Answer:  No    Order Specific Question:  Diffusion capacity (DLCO)    Answer:  No    Order Specific Question:  Lung volumes    Answer:  No    Order Specific Question:  Methacholine challenge    Answer:  No    Thank  you for the visitation and for allowing  [facility] to assist in the care of your patient. Our recommendations are noted above.  Please contact us if we can be of further service.  Vilinda Boehringer, MD [facility] Office Number: [phone]

## 2014-10-28 ENCOUNTER — Ambulatory Visit
Admission: RE | Admit: 2014-10-28 | Discharge: 2014-10-28 | Disposition: A | Discharge status: Home / Self Care | Payer: 59 | Source: Ambulatory Visit | Attending: Internal Medicine | Admitting: Internal Medicine

## 2014-10-28 DIAGNOSIS — R0602 Shortness of breath: Secondary | ICD-10-CM

## 2014-10-28 DIAGNOSIS — J986 Disorders of diaphragm: Secondary | ICD-10-CM

## 2014-11-22 ENCOUNTER — Other Ambulatory Visit: Payer: Self-pay | Admitting: Internal Medicine

## 2014-11-22 ENCOUNTER — Other Ambulatory Visit: Payer: Self-pay | Admitting: Nurse Practitioner

## 2014-12-02 ENCOUNTER — Ambulatory Visit: Payer: No Typology Code available for payment source | Admitting: Nurse Practitioner

## 2014-12-09 ENCOUNTER — Ambulatory Visit (INDEPENDENT_AMBULATORY_CARE_PROVIDER_SITE_OTHER): Payer: 59 | Admitting: Nurse Practitioner

## 2014-12-09 ENCOUNTER — Encounter: Payer: Self-pay | Admitting: Nurse Practitioner

## 2014-12-09 VITALS — BP 148/96 | HR 70 | Temp 97.7°F | Resp 18 | Ht 75.0 in | Wt 273.2 lb

## 2014-12-09 DIAGNOSIS — G4730 Sleep apnea, unspecified: Secondary | ICD-10-CM | POA: Diagnosis not present

## 2014-12-09 DIAGNOSIS — E669 Obesity, unspecified: Secondary | ICD-10-CM | POA: Diagnosis not present

## 2014-12-09 DIAGNOSIS — R0602 Shortness of breath: Secondary | ICD-10-CM

## 2014-12-09 MED ORDER — CYCLOBENZAPRINE HCL 10 MG PO TABS: 10.0000 mg | ORAL_TABLET | Freq: Every day | ORAL | Status: DC

## 2014-12-09 NOTE — Assessment & Plan Note (Signed)
Patient cannot wear CPAP mask due to sleepwalking and pulling it off at nighttime. Patient has a sleep app that shows awake versus deep sleep and for how long. She is keeping a log of stressful days and will begin to keep a log of food and how it affects his sleep.

## 2014-12-09 NOTE — Assessment & Plan Note (Signed)
May be due to food allergy. Patient has normal lung function as tested by 2 different pulmonologists. Started discussing watching different foods especially beef. Patient has the handout for GERD trigger foods on the ABS she will try to identify some of these triggers and will follow-up in 2 months.

## 2014-12-09 NOTE — Assessment & Plan Note (Signed)
Had a follow-up discussion with patient about tips for reduction of caloric foods. Patient is exercising 4 times a week for at least an hour.

## 2014-12-09 NOTE — Progress Notes (Signed)
Patient ID: Thomas Cole, male    DOB: March 21, 1971  Age: 44 y.o. MRN: [id number]  CC: Follow-up   HPI Thomas Cole presents for follow up on breathing concerns.   1) Diaphragm is fine per SNIF test with Dr. Stevenson Clinch  Eating certain foods makes breathing worse.   Hibachi   [place] steak and cheese   Moes- with the ground beef only he gets symptoms  Patient reports symptoms include heaviness of chest and trouble breathing when working out. Negative workup from pulmonology.  2) Working out 4 x a week.   3) Patient has a sleep app that shows awake to deep sleep and his cycles are very variable. He will start tracking food in addition to his sleep patterns. He is a candidate for CPAP but cannot wear because of his sleepwalking and pulling off the mask overnight.     History Thomas Cole has a past medical history of Hypertension; Migraine headache; Asthma; Back problem; Anxiety; GERD (gastroesophageal reflux disease); Complication of anesthesia; Shortness of breath; Pneumonia; Seizures; and Depression.   He has past surgical history that includes Knee surgery; Posterior fusion cervical spine; Cervical disc surgery; Appendectomy (12); and Anterior cervical corpectomy (N/A, 09/15/2012).   His family history includes Arthritis in his father, maternal grandfather, maternal grandmother, mother, paternal grandfather, and paternal grandmother; Asthma in his mother; Coronary artery disease in his father and mother; Heart disease in his father, maternal grandfather, maternal grandmother, paternal grandfather, and paternal grandmother; Hypertension in his maternal grandfather, maternal grandmother, mother, paternal grandfather, and paternal grandmother; Mental illness in his mother. There is no history of Cancer, Diabetes, or Stroke.He reports that he has never smoked. He has never used smokeless tobacco. He reports that he does not drink alcohol or use illicit drugs.  Outpatient Prescriptions Prior to Visit   Medication Sig Dispense Refill  . albuterol (PROVENTIL HFA;VENTOLIN HFA) 108 (90 BASE) MCG/ACT inhaler Inhale 2 puffs into the lungs every 6 (six) hours as needed for wheezing or shortness of breath. 1 Inhaler 11  . famotidine (PEPCID) 20 MG tablet One at bedtime 30 tablet 11  . furosemide (LASIX) 20 MG tablet TAKE 1 TABLET (20 MG TOTAL) BY MOUTH DAILY. 30 tablet 5  . hydrochlorothiazide (MICROZIDE) 12.5 MG capsule TAKE ONE CAPSULE BY MOUTH EVERY DAY 30 capsule 5  . omeprazole (PRILOSEC) 20 MG capsule Take x 2 x 30 m before bfast    . phenytoin (DILANTIN) 100 MG ER capsule TAKE 2 CAPSULES BY MOUTH IN THE MORNING AND TAKE 3 CAPSULES IN THE EVENING AS DIRECTED 150 capsule 2  . traMADol (ULTRAM) 50 MG tablet Take 1 tablet (50 mg total) by mouth every 8 (eight) hours as needed. 90 tablet 0   No facility-administered medications prior to visit.    ROS Review of Systems  Constitutional: Negative for fever, chills, diaphoresis and fatigue.  Eyes: Negative for visual disturbance.  Respiratory: Positive for chest tightness and shortness of breath. Negative for wheezing.        Intermittent  Cardiovascular: Positive for leg swelling. Negative for chest pain and palpitations.       Patient reports bilateral leg swelling with pitting edema at the end of the day  Gastrointestinal: Negative for nausea, vomiting and diarrhea.  Endocrine: Negative for polydipsia, polyphagia and polyuria.  Skin: Negative for rash.  Neurological: Negative for dizziness, weakness and numbness.  Psychiatric/Behavioral: Positive for sleep disturbance. Negative for suicidal ideas. The patient is not nervous/anxious.  PTSD and severe sleep disturbance   Objective:  BP 148/96 mmHg  Pulse 70  Temp(Src) 97.7 F (36.5 C)  Resp 18  Ht 6\' 3"  (1.905 m)  Wt 273 lb 3.2 oz (123.923 kg)  BMI 34.15 kg/m2  SpO2 95%  Physical Exam  Constitutional: He is oriented to person, place, and time. He appears well-developed and  well-nourished. No distress.  HENT:  Head: Normocephalic and atraumatic.  Right Ear: External ear normal.  Left Ear: External ear normal.  Cardiovascular: Normal rate, regular rhythm and normal heart sounds.   Pulmonary/Chest: Effort normal and breath sounds normal. No respiratory distress. He has no wheezes. He has no rales. He exhibits no tenderness.  Neurological: He is alert and oriented to person, place, and time.  Skin: Skin is warm and dry. No rash noted. He is not diaphoretic.  Psychiatric: He has a normal mood and affect. His behavior is normal. Judgment and thought content normal.   Assessment & Plan:   Thomas Cole was seen today for follow-up.  Diagnoses and all orders for this visit:  Obesity (BMI 30-39.9)  Sleep apnea  SOB (shortness of breath)  Other orders -     cyclobenzaprine (FLEXERIL) 10 MG tablet; Take 1 tablet (10 mg total) by mouth at bedtime.  I am having Thomas Cole start on cyclobenzaprine. I am also having him maintain his albuterol, hydrochlorothiazide, traMADol, famotidine, omeprazole, furosemide, and phenytoin.  Meds ordered this encounter  Medications  . cyclobenzaprine (FLEXERIL) 10 MG tablet    Sig: Take 1 tablet (10 mg total) by mouth at bedtime.    Dispense:  30 tablet    Refill:  0    Order Specific Question:  Supervising Provider    Answer:  Crecencio Mc [[id number]]     Follow-up: Return in about 2 months (around 02/08/2015) for Follow up.

## 2014-12-09 NOTE — Progress Notes (Signed)
Pre visit review using our clinic review tool, if applicable. No additional management support is needed unless otherwise documented below in the visit note. 

## 2014-12-09 NOTE — Patient Instructions (Signed)

## 2015-01-27 ENCOUNTER — Other Ambulatory Visit: Payer: Self-pay | Admitting: Nurse Practitioner

## 2015-02-10 ENCOUNTER — Ambulatory Visit: Payer: 59 | Admitting: Nurse Practitioner

## 2015-02-28 ENCOUNTER — Other Ambulatory Visit: Payer: Self-pay | Admitting: Nurse Practitioner

## 2015-03-17 ENCOUNTER — Other Ambulatory Visit: Payer: Self-pay | Admitting: Nurse Practitioner

## 2015-03-17 NOTE — Telephone Encounter (Signed)
Please advise, refill. 

## 2015-04-28 ENCOUNTER — Other Ambulatory Visit: Payer: Self-pay | Admitting: Nurse Practitioner

## 2015-05-05 ENCOUNTER — Ambulatory Visit: Payer: 59 | Admitting: Nurse Practitioner

## 2015-05-19 ENCOUNTER — Other Ambulatory Visit: Payer: Self-pay | Admitting: Nurse Practitioner

## 2015-05-19 ENCOUNTER — Encounter: Payer: Self-pay | Admitting: Nurse Practitioner

## 2015-05-19 ENCOUNTER — Ambulatory Visit (INDEPENDENT_AMBULATORY_CARE_PROVIDER_SITE_OTHER): Payer: 59 | Admitting: Nurse Practitioner

## 2015-05-19 VITALS — BP 130/90 | HR 74 | Temp 98.4°F | Ht 75.0 in

## 2015-05-19 DIAGNOSIS — F4329 Adjustment disorder with other symptoms: Secondary | ICD-10-CM

## 2015-05-19 DIAGNOSIS — I10 Essential (primary) hypertension: Secondary | ICD-10-CM

## 2015-05-19 DIAGNOSIS — R0989 Other specified symptoms and signs involving the circulatory and respiratory systems: Secondary | ICD-10-CM

## 2015-05-19 MED ORDER — PREGABALIN 25 MG PO CAPS: 25.0000 mg | ORAL_CAPSULE | Freq: Two times a day (BID) | ORAL | Status: DC

## 2015-05-19 MED ORDER — ALPRAZOLAM 0.5 MG PO TABS: 0.5000 mg | ORAL_TABLET | Freq: Every evening | ORAL | Status: DC | PRN

## 2015-05-19 NOTE — Progress Notes (Signed)
Patient ID: Thomas Cole, male    DOB: [birth year]  Age: 45 y.o. MRN: [id number]  CC: Hypertension and Follow-up   HPI Thomas Cole presents for follow up of HTN.   1) Pt reports anxiety is worsening his HTN Readings from home Denies changes to vision, chest pains, or radiating jaw/arm pain.  Pt is taking his medications as prescribed.   Patient is also having pain and worsening of PTSD He is recently buying a house and has had some concerns with this which have increased his anxiety 1 night a week or 2 ago patient reports he had an anxiety attack in the night and started to have nausea and vomiting he called EMS and they checked him out 151/121 when EMS got there  Went down by the time they left (they did not take him to the hospital)  Pain- worsening  Amitriptyline- Weird feelings  History Frenchie has a past medical history of Hypertension; Migraine headache; Asthma; Back problem; Anxiety; GERD (gastroesophageal reflux disease); Complication of anesthesia; Shortness of breath; Pneumonia; Seizures ([place]); and Depression.   He has past surgical history that includes Knee surgery; Posterior fusion cervical spine; Cervical disc surgery; Appendectomy (11); and Anterior cervical corpectomy (N/A, 09/15/2012).   His family history includes Arthritis in his father, maternal grandfather, maternal grandmother, mother, paternal grandfather, and paternal grandmother; Asthma in his mother; Coronary artery disease in his father and mother; Heart disease in his father, maternal grandfather, maternal grandmother, paternal grandfather, and paternal grandmother; Hypertension in his maternal grandfather, maternal grandmother, mother, paternal grandfather, and paternal grandmother; Mental illness in his mother. There is no history of Cancer, Diabetes, or Stroke.He reports that he has never smoked. He has never used smokeless tobacco. He reports that he does not drink alcohol or use illicit drugs.  Outpatient  Prescriptions Prior to Visit  Medication Sig Dispense Refill  . albuterol (PROVENTIL HFA;VENTOLIN HFA) 108 (90 BASE) MCG/ACT inhaler Inhale 2 puffs into the lungs every 6 (six) hours as needed for wheezing or shortness of breath. 1 Inhaler 11  . cyclobenzaprine (FLEXERIL) 10 MG tablet Take 1 tablet (10 mg total) by mouth at bedtime. 30 tablet 0  . famotidine (PEPCID) 20 MG tablet One at bedtime 30 tablet 11  . furosemide (LASIX) 20 MG tablet TAKE 1 TABLET (20 MG TOTAL) BY MOUTH DAILY. 30 tablet 5  . hydrochlorothiazide (MICROZIDE) 12.5 MG capsule TAKE ONE CAPSULE BY MOUTH EVERY DAY 30 capsule 7  . omeprazole (PRILOSEC) 20 MG capsule Take x 2 x 30 m before bfast    . phenytoin (DILANTIN) 100 MG ER capsule TAKE 2 CAPSULES BY MOUTH IN THE MORNING AND TAKE 3 CAPSULES IN THE EVENING AS DIRECTED 150 capsule 0  . traMADol (ULTRAM) 50 MG tablet Take 1 tablet (50 mg total) by mouth every 8 (eight) hours as needed. 90 tablet 0   No facility-administered medications prior to visit.    ROS Review of Systems  Constitutional: Negative for fever, chills, diaphoresis and fatigue.  Eyes: Negative for visual disturbance.  Respiratory: Negative for chest tightness, shortness of breath and wheezing.   Cardiovascular: Negative for chest pain, palpitations and leg swelling.  Musculoskeletal: Positive for arthralgias and neck pain. Negative for gait problem.  Neurological: Negative for dizziness, weakness and numbness.  Psychiatric/Behavioral: Positive for sleep disturbance. Negative for suicidal ideas. The patient is nervous/anxious.     Objective:  BP 130/90 mmHg  Pulse 74  Temp(Src) 98.4 F (36.9 C) (Oral)  Ht 6\' 3"  (1.905  m)  SpO2 97%  Physical Exam  Constitutional: He is oriented to person, place, and time. He appears well-developed and well-nourished. No distress.  HENT:  Head: Normocephalic and atraumatic.  Right Ear: External ear normal.  Left Ear: External ear normal.  Cardiovascular: Normal  rate, regular rhythm and normal heart sounds.  Exam reveals no gallop and no friction rub.   No murmur heard. Pulmonary/Chest: Effort normal and breath sounds normal. No respiratory distress. He has no wheezes. He has no rales. He exhibits no tenderness.  Neurological: He is alert and oriented to person, place, and time.  Skin: Skin is warm and dry. No rash noted. He is not diaphoretic.  Psychiatric: He has a normal mood and affect. His behavior is normal. Judgment and thought content normal.   Assessment & Plan:   Almus was seen today for hypertension and follow-up.  Diagnoses and all orders for this visit:  Labile hypertension  Stress and adjustment reaction  Other orders -     pregabalin (LYRICA) 25 MG capsule; Take 1 capsule (25 mg total) by mouth 2 (two) times daily. -     ALPRAZolam (XANAX) 0.5 MG tablet; Take 1 tablet (0.5 mg total) by mouth at bedtime as needed for anxiety.  I am having Thomas Cole start on pregabalin and ALPRAZolam. I am also having him maintain his albuterol, traMADol, famotidine, omeprazole, furosemide, cyclobenzaprine, hydrochlorothiazide, and phenytoin.  Meds ordered this encounter  Medications  . pregabalin (LYRICA) 25 MG capsule    Sig: Take 1 capsule (25 mg total) by mouth 2 (two) times daily.    Dispense:  60 capsule    Refill:  0    Order Specific Question:  Supervising Provider    Answer:  Deborra Medina L [[id number]]  . ALPRAZolam (XANAX) 0.5 MG tablet    Sig: Take 1 tablet (0.5 mg total) by mouth at bedtime as needed for anxiety.    Dispense:  60 tablet    Refill:  0    Order Specific Question:  Supervising Provider    Answer:  Crecencio Mc [[id number]]     Follow-up: Return in about 4 weeks (around 06/16/2015) for Medication follow up .

## 2015-05-19 NOTE — Patient Instructions (Signed)
Please try 1 lyrica capsule for 7 days then up to 2 capsules   1-2 xanax at night for sleep help  We will call you with your referral.   Follow up in 3-4 weeks.

## 2015-05-19 NOTE — Progress Notes (Signed)
Pre visit review using our clinic review tool, if applicable. No additional management support is needed unless otherwise documented below in the visit note. 

## 2015-05-23 NOTE — Assessment & Plan Note (Signed)
Pt feels stress is worsening his attention Patient is compliant on medications Patient is willing to try low-dose Xanax at nighttime and Lyrica during the day for pain management follow-up in 4 weeks

## 2015-05-23 NOTE — Assessment & Plan Note (Signed)
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 We will look into this and refer

## 2015-05-30 ENCOUNTER — Telehealth: Payer: Self-pay

## 2015-05-30 NOTE — Telephone Encounter (Signed)
PA for Lyrica completed on Cover my meds.

## 2015-05-31 ENCOUNTER — Other Ambulatory Visit: Payer: Self-pay | Admitting: Nurse Practitioner

## 2015-06-01 NOTE — Telephone Encounter (Signed)
Has failed Amitriptyline- this is in the note from his last visit. We can try Cymbalta if he is willing.  Thanks for letting me know

## 2015-06-01 NOTE — Telephone Encounter (Signed)
I attempted to call the patient, busy signal will try again

## 2015-06-01 NOTE — Telephone Encounter (Signed)
PA was denied.  Please advise, patient has not tried gabapentin, cymbalta or amitriptyline.

## 2015-06-02 ENCOUNTER — Other Ambulatory Visit: Payer: Self-pay | Admitting: Nurse Practitioner

## 2015-06-02 MED ORDER — DULOXETINE HCL 20 MG PO CPEP: 20.0000 mg | ORAL_CAPSULE | Freq: Every day | ORAL | Status: DC

## 2015-06-02 NOTE — Telephone Encounter (Signed)
Spoke with the patient, he is willing to try the cymbalta.  It can be sent to the [facility] [address], thanks

## 2015-06-02 NOTE — Telephone Encounter (Signed)
Left a detailed message to return my call, if he was willing to try cymbalta.  Thanks

## 2015-06-02 NOTE — Telephone Encounter (Signed)
I have sent this over. Thanks!

## 2015-06-09 ENCOUNTER — Encounter: Payer: Self-pay | Admitting: Nurse Practitioner

## 2015-06-09 ENCOUNTER — Other Ambulatory Visit: Payer: Self-pay | Admitting: Nurse Practitioner

## 2015-06-09 MED ORDER — GABAPENTIN 300 MG PO CAPS: 300.0000 mg | ORAL_CAPSULE | Freq: Every day | ORAL | Status: DC

## 2015-06-16 ENCOUNTER — Encounter: Payer: Self-pay | Admitting: Nurse Practitioner

## 2015-06-16 ENCOUNTER — Ambulatory Visit (INDEPENDENT_AMBULATORY_CARE_PROVIDER_SITE_OTHER): Payer: 59 | Admitting: Nurse Practitioner

## 2015-06-16 VITALS — BP 132/90 | HR 71 | Temp 97.7°F | Wt 265.0 lb

## 2015-06-16 DIAGNOSIS — F329 Major depressive disorder, single episode, unspecified: Secondary | ICD-10-CM | POA: Diagnosis not present

## 2015-06-16 DIAGNOSIS — F4329 Adjustment disorder with other symptoms: Secondary | ICD-10-CM

## 2015-06-16 DIAGNOSIS — F32A Depression, unspecified: Secondary | ICD-10-CM

## 2015-06-16 DIAGNOSIS — F4310 Post-traumatic stress disorder, unspecified: Secondary | ICD-10-CM

## 2015-06-16 DIAGNOSIS — M791 Myalgia, unspecified site: Secondary | ICD-10-CM

## 2015-06-16 LAB — FERRITIN: Ferritin: 111.4 ng/mL (ref 22.0–322.0)

## 2015-06-16 MED ORDER — PHENYTOIN SODIUM EXTENDED 100 MG PO CAPS: ORAL_CAPSULE | ORAL | Status: DC

## 2015-06-16 MED ORDER — ALPRAZOLAM 0.5 MG PO TABS: 0.5000 mg | ORAL_TABLET | Freq: Every evening | ORAL | Status: DC | PRN

## 2015-06-16 NOTE — Assessment & Plan Note (Signed)
Trying Gabapentin, cymbalta too expensive, amitriptyline- can't take due to side effects. Asked him to up to 2 caps at night starting Sat. THEN FU via MyChart in 1 more week to see if we need to retry lyrica PA

## 2015-06-16 NOTE — Progress Notes (Signed)
Patient ID: Thomas Cole, male    DOB: 26-Dec-1970  Age: 45 y.o. MRN: [id number]  CC: Follow-up   HPI Thomas Cole presents for follow up of medications and anxiety.   1) Pt started on Gabapentin for neuropathy r/t cervical spine   Some drowsiness during the day, but unsure if it is the medication or his stressors- see below  Lyrica worked in past, PA was denied. Cymbalta- couldn't afford, amitriptyline had side effects in past. Willing to continue on Gabapentin for a week or two and will decide.   2) Stress adjustment/PTSD- pt was in [employer] and has some PTSD from incidents during his time. He is anxious, has sleep disturbance, went through a divorce, and his job causes a lot of stress. He has not heard from referrals about seeing a counselor regarding these.   Xanax helpful with sleep, may wake up once or twice   History Musashi has a past medical history of Hypertension; Migraine headache; Asthma; Back problem; Anxiety; GERD (gastroesophageal reflux disease); Complication of anesthesia; Shortness of breath; Pneumonia; Seizures ([place]); and Depression.   He has past surgical history that includes Knee surgery; Posterior fusion cervical spine; Cervical disc surgery; Appendectomy (12); and Anterior cervical corpectomy (N/A, 09/15/2012).   His family history includes Arthritis in his father, maternal grandfather, maternal grandmother, mother, paternal grandfather, and paternal grandmother; Asthma in his mother; Coronary artery disease in his father and mother; Heart disease in his father, maternal grandfather, maternal grandmother, paternal grandfather, and paternal grandmother; Hypertension in his maternal grandfather, maternal grandmother, mother, paternal grandfather, and paternal grandmother; Mental illness in his mother. There is no history of Cancer, Diabetes, or Stroke.He reports that he has never smoked. He has never used smokeless tobacco. He reports that he does not drink alcohol or  use illicit drugs.  Outpatient Prescriptions Prior to Visit  Medication Sig Dispense Refill  . albuterol (PROVENTIL HFA;VENTOLIN HFA) 108 (90 BASE) MCG/ACT inhaler Inhale 2 puffs into the lungs every 6 (six) hours as needed for wheezing or shortness of breath. 1 Inhaler 11  . cyclobenzaprine (FLEXERIL) 10 MG tablet Take 1 tablet (10 mg total) by mouth at bedtime. 30 tablet 0  . famotidine (PEPCID) 20 MG tablet One at bedtime 30 tablet 11  . furosemide (LASIX) 20 MG tablet TAKE 1 TABLET (20 MG TOTAL) BY MOUTH DAILY. 30 tablet 5  . gabapentin (NEURONTIN) 300 MG capsule Take 1 capsule (300 mg total) by mouth at bedtime. 30 capsule 0  . hydrochlorothiazide (MICROZIDE) 12.5 MG capsule TAKE ONE CAPSULE BY MOUTH EVERY DAY 30 capsule 7  . omeprazole (PRILOSEC) 20 MG capsule Take x 2 x 30 m before bfast    . traMADol (ULTRAM) 50 MG tablet Take 1 tablet (50 mg total) by mouth every 8 (eight) hours as needed. 90 tablet 0  . ALPRAZolam (XANAX) 0.5 MG tablet Take 1 tablet (0.5 mg total) by mouth at bedtime as needed for anxiety. 60 tablet 0  . phenytoin (DILANTIN) 100 MG ER capsule TAKE 2 CAPSULES BY MOUTH IN THE MORNING AND TAKE 3 CAPSULES IN THE EVENING AS DIRECTED 150 capsule 0   No facility-administered medications prior to visit.    ROS Review of Systems  Constitutional: Positive for fatigue. Negative for fever, chills and diaphoresis.  Eyes: Negative for visual disturbance.  Respiratory: Negative for chest tightness, shortness of breath and wheezing.   Cardiovascular: Negative for chest pain, palpitations and leg swelling.  Gastrointestinal: Negative for nausea, vomiting and diarrhea.  Neurological: Positive for numbness. Negative for dizziness, light-headedness and headaches.  Psychiatric/Behavioral: Positive for sleep disturbance. Negative for suicidal ideas, self-injury and decreased concentration. The patient is nervous/anxious.     Objective:  BP 132/90 mmHg  Pulse 71  Temp(Src) 97.7 F  (36.5 C) (Oral)  Wt 265 lb (120.203 kg)  SpO2 98%  Physical Exam  Constitutional: He is oriented to person, place, and time. He appears well-developed and well-nourished. No distress.  HENT:  Head: Normocephalic and atraumatic.  Right Ear: External ear normal.  Left Ear: External ear normal.  Cardiovascular: Normal rate, regular rhythm and normal heart sounds.  Exam reveals no gallop and no friction rub.   No murmur heard. Pulmonary/Chest: Effort normal and breath sounds normal. No respiratory distress. He has no wheezes. He has no rales. He exhibits no tenderness.  Neurological: He is alert and oriented to person, place, and time.  Skin: Skin is warm and dry. No rash noted. He is not diaphoretic.  Psychiatric: He has a normal mood and affect. His behavior is normal. Judgment and thought content normal.  Very stoic   Assessment & Plan:   [name] was seen today for follow-up.  Diagnoses and all orders for this visit:  Depression -     Ambulatory referral to Psychology  Myalgia -     Ferritin  Stress and adjustment reaction -     Ambulatory referral to Psychology  PTSD (post-traumatic stress disorder) -     Ambulatory referral to Psychology  Other orders -     phenytoin (DILANTIN) 100 MG ER capsule; TAKE 2 CAPSULES BY MOUTH IN THE MORNING AND TAKE 3 CAPSULES IN THE EVENING AS DIRECTED -     ALPRAZolam (XANAX) 0.5 MG tablet; Take 1 tablet (0.5 mg total) by mouth at bedtime as needed for anxiety.   I am having Mr. Detty maintain his albuterol, traMADol, famotidine, omeprazole, furosemide, cyclobenzaprine, hydrochlorothiazide, gabapentin, phenytoin, and ALPRAZolam.  Meds ordered this encounter  Medications  . phenytoin (DILANTIN) 100 MG ER capsule    Sig: TAKE 2 CAPSULES BY MOUTH IN THE MORNING AND TAKE 3 CAPSULES IN THE EVENING AS DIRECTED    Dispense:  150 capsule    Refill:  1    Order Specific Question:  Supervising Provider    Answer:  Deborra Medina L [[id number]]  .  ALPRAZolam (XANAX) 0.5 MG tablet    Sig: Take 1 tablet (0.5 mg total) by mouth at bedtime as needed for anxiety.    Dispense:  60 tablet    Refill:  0    Order Specific Question:  Supervising Provider    Answer:  Crecencio Mc [[id number]]     Follow-up: Return if symptoms worsen or fail to improve.

## 2015-06-16 NOTE — Progress Notes (Signed)
Pre visit review using our clinic review tool, if applicable. No additional management support is needed unless otherwise documented below in the visit note. 

## 2015-06-16 NOTE — Assessment & Plan Note (Signed)
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

## 2015-06-16 NOTE — Patient Instructions (Addendum)
Please visit the lab before leaving today.   Try the gabapentin 2 capsules Saturday for 1 week (if you have enough) and MyChart me to let me know if we need to resubmit the prior auth for Lyrica.

## 2015-06-16 NOTE — Assessment & Plan Note (Signed)
See stress reaction and PTSD

## 2015-06-16 NOTE — Assessment & Plan Note (Signed)
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

## 2015-06-22 ENCOUNTER — Encounter: Payer: Self-pay | Admitting: Nurse Practitioner

## 2015-06-23 ENCOUNTER — Ambulatory Visit (INDEPENDENT_AMBULATORY_CARE_PROVIDER_SITE_OTHER): Payer: 59 | Admitting: Licensed Clinical Social Worker

## 2015-06-23 ENCOUNTER — Encounter: Payer: Self-pay | Admitting: Licensed Clinical Social Worker

## 2015-06-23 DIAGNOSIS — F411 Generalized anxiety disorder: Secondary | ICD-10-CM | POA: Diagnosis not present

## 2015-06-23 DIAGNOSIS — F4310 Post-traumatic stress disorder, unspecified: Secondary | ICD-10-CM

## 2015-06-23 DIAGNOSIS — F332 Major depressive disorder, recurrent severe without psychotic features: Secondary | ICD-10-CM | POA: Diagnosis not present

## 2015-06-23 HISTORY — DX: Major depressive disorder, recurrent severe without psychotic features: F33.2
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
## 2015-06-29 ENCOUNTER — Other Ambulatory Visit: Payer: Self-pay | Admitting: Nurse Practitioner

## 2015-07-06 ENCOUNTER — Other Ambulatory Visit: Payer: Self-pay | Admitting: Nurse Practitioner

## 2015-07-06 MED ORDER — CYCLOBENZAPRINE HCL 10 MG PO TABS: 10.0000 mg | ORAL_TABLET | Freq: Every day | ORAL | Status: DC

## 2015-07-06 NOTE — Telephone Encounter (Signed)
Last refilled in 12/2014.please advise?

## 2015-07-07 ENCOUNTER — Ambulatory Visit: Payer: 59 | Admitting: Licensed Clinical Social Worker

## 2015-07-21 ENCOUNTER — Ambulatory Visit: Payer: 59 | Admitting: Licensed Clinical Social Worker

## 2015-07-28 ENCOUNTER — Ambulatory Visit: Payer: 59 | Admitting: Psychiatry

## 2015-08-01 ENCOUNTER — Other Ambulatory Visit: Payer: Self-pay | Admitting: Nurse Practitioner

## 2015-08-02 ENCOUNTER — Other Ambulatory Visit: Payer: Self-pay | Admitting: Nurse Practitioner

## 2015-08-30 ENCOUNTER — Other Ambulatory Visit: Payer: Self-pay | Admitting: Family Medicine

## 2015-08-30 NOTE — Telephone Encounter (Signed)
Refilled on 08/01/15. No appointment was made with another provider. Please advise?

## 2015-08-30 NOTE — Telephone Encounter (Signed)
I do not prescribe or refill seizure meds. He should contact his neurologist.

## 2015-09-02 ENCOUNTER — Other Ambulatory Visit: Payer: Self-pay

## 2015-09-02 MED ORDER — PHENYTOIN SODIUM EXTENDED 100 MG PO CAPS: ORAL_CAPSULE | ORAL | Status: DC

## 2015-09-05 ENCOUNTER — Other Ambulatory Visit: Payer: Self-pay

## 2015-09-05 MED ORDER — HYDROCHLOROTHIAZIDE 12.5 MG PO CAPS: 12.5000 mg | ORAL_CAPSULE | Freq: Every day | ORAL | Status: DC

## 2015-09-05 NOTE — Telephone Encounter (Signed)
Approved HCTZ for one month. Pt needs OV for additional refills. Last metabolic panel done a year ago.

## 2015-10-06 ENCOUNTER — Other Ambulatory Visit: Payer: Self-pay | Admitting: Nurse Practitioner

## 2015-10-06 ENCOUNTER — Other Ambulatory Visit: Payer: Self-pay | Admitting: Family Medicine

## 2015-10-06 NOTE — Telephone Encounter (Signed)
Refilled 09/02/15 by Dr.Dunn. Patient last seen by Morey Hummingbird on 06/16/15. Please advise?

## 2015-10-07 ENCOUNTER — Telehealth: Payer: Self-pay | Admitting: Family Medicine

## 2015-10-07 NOTE — Telephone Encounter (Signed)
Lm on vm to let pt know about medication.

## 2015-10-07 NOTE — Telephone Encounter (Signed)
Patient came into the office stating that if you could prescribe Dilantin up to his appointment to get him referred to someone he is okay with that. Please advise?

## 2015-10-07 NOTE — Telephone Encounter (Signed)
Hydrochlorothiazide was refilled yesterday. Dr.Cook will NOT refill dilantin.

## 2015-10-07 NOTE — Telephone Encounter (Signed)
Pt has an appointment next Thursday with Dr. Lacinda Axon. Pt needs a refill on  phenytoin (DILANTIN) 100 MG ER capsule and hydrochlorothiazide (MICROZIDE) 12.5 MG capsule. He has ran out of his medication.

## 2015-10-10 ENCOUNTER — Other Ambulatory Visit: Payer: Self-pay | Admitting: Family Medicine

## 2015-10-10 DIAGNOSIS — R569 Unspecified convulsions: Secondary | ICD-10-CM

## 2015-10-10 MED ORDER — PHENYTOIN SODIUM EXTENDED 100 MG PO CAPS: ORAL_CAPSULE | ORAL | Status: DC

## 2015-10-10 NOTE — Telephone Encounter (Signed)
Refill x 30 days. Referral placed to neurology.

## 2015-10-13 ENCOUNTER — Encounter: Payer: Self-pay | Admitting: Family Medicine

## 2015-10-13 ENCOUNTER — Ambulatory Visit (INDEPENDENT_AMBULATORY_CARE_PROVIDER_SITE_OTHER): Payer: 59 | Admitting: Family Medicine

## 2015-10-13 VITALS — BP 146/104 | HR 80 | Temp 97.6°F | Wt 268.0 lb

## 2015-10-13 DIAGNOSIS — Z8669 Personal history of other diseases of the nervous system and sense organs: Secondary | ICD-10-CM | POA: Diagnosis not present

## 2015-10-13 DIAGNOSIS — F411 Generalized anxiety disorder: Secondary | ICD-10-CM

## 2015-10-13 DIAGNOSIS — Z87898 Personal history of other specified conditions: Secondary | ICD-10-CM

## 2015-10-13 MED ORDER — ALPRAZOLAM 0.5 MG PO TABS: 0.5000 mg | ORAL_TABLET | Freq: Every evening | ORAL | Status: DC | PRN

## 2015-10-13 NOTE — Progress Notes (Signed)
Subjective:  Patient ID: Thomas Cole, male    DOB: [birth year]  Age: 45 y.o. MRN: [id number]  CC: Follow up, medication refill  HPI:  45 year old male with a remote history of seizure and depression/anxiety/PTSD presents for follow-up. He is in need of medication refill.  Seizure  Patient states that he had a seizure after head injury in 1987.  He has been on Dilantin for years and has been stable.  He states that he seen several neurologists who have different opinions about whether he should continue or discontinue.  I have no record of him seeing a neurologist other than a visit in 2016 for sleep disorder.  He has been receiving Dilantin from our office (from our former NP).  He has recently requested a refill on this and I obliged.  He needs to see a neurologist as I do not manage seizure disorder and I have no record of this.  Anxiety  Patient reports that his anxiety is stable.  He takes Xanax at night for anxiety and it also helps with sleep.  He is requesting refill today.  Social Hx   Social History   Social History  . Marital Status: Single    Spouse Name: N/A  . Number of Children: 0  . Years of Education: college   Occupational History  .      [employer]age [employer]   Social History Main Topics  . Smoking status: Never Smoker   . Smokeless tobacco: Never Used  . Alcohol Use: No  . Drug Use: No  . Sexual Activity: Yes   Other Topics Concern  . None   Social History Narrative   Patient lives at home alone and he single.    Patient works full time for [employer].   Education college.   Right handed.   Caffeine one coke cola in the mornings.   Review of Systems  Constitutional: Negative.   Psychiatric/Behavioral: The patient is nervous/anxious.    Objective:  BP 146/104 mmHg  Pulse 80  Temp(Src) 97.6 F (36.4 C) (Oral)  Wt 268 lb (121.564 kg)  SpO2 98%  BP/Weight 10/13/2015 06/16/2015 [id number]  Systolic BP 123456 [id number]  Diastolic BP 123456 90  90  Wt. (Lbs) 268 265 -  BMI 33.5 33.12 -   Physical Exam  Constitutional: He is oriented to person, place, and time. He appears well-developed. No distress.  Cardiovascular: Normal rate and regular rhythm.   Pulmonary/Chest: Effort normal. He has no wheezes. He has no rales.  Neurological: He is alert and oriented to person, place, and time.  Psychiatric:  Flat affect, depressed mood.  Vitals reviewed.  Lab Results  Component Value Date   WBC 10.3 11/11/2013   HGB 14.9 11/11/2013   HCT 44.7 11/11/2013   PLT 209.0 11/11/2013   GLUCOSE 90 08/05/2014   CHOL 154 11/11/2013   TRIG 63.0 11/11/2013   HDL 48.00 11/11/2013   LDLCALC 93 11/11/2013   ALT 19 08/05/2014   AST 20 08/05/2014   NA 138 08/05/2014   K 4.0 08/05/2014   CL 104 08/05/2014   CREATININE 1.04 08/05/2014   BUN 18 08/05/2014   CO2 29 08/05/2014   TSH 0.94 11/11/2013   MICROALBUR 0.4 03/05/2014   Assessment & Plan:   Problem List Items Addressed This Visit    History of seizures - Primary    Stable. No records from neurology. Refilled Dilantin. Referred to neurology.      Generalized anxiety disorder  Stable. Xanax refilled today.         Meds ordered this encounter  Medications  . ALPRAZolam (XANAX) 0.5 MG tablet    Sig: Take 1 tablet (0.5 mg total) by mouth at bedtime as needed for anxiety.    Dispense:  60 tablet    Refill:  0   Follow-up: PRN  [place]

## 2015-10-13 NOTE — Progress Notes (Signed)
Pre visit review using our clinic review tool, if applicable. No additional management support is needed unless otherwise documented below in the visit note. 

## 2015-10-13 NOTE — Assessment & Plan Note (Signed)
Stable. No records from neurology. Refilled Dilantin. Referred to neurology.

## 2015-10-13 NOTE — Patient Instructions (Signed)
I have refilled your Xanax.  Please see the neurologist (I have no records regarding this and I do not manage seizure medications/seizure disorder).  Follow up in 6 months to 1 year.   Take care  Dr. Lacinda Axon

## 2015-10-13 NOTE — Assessment & Plan Note (Signed)
Stable. Xanax refilled today.  

## 2015-12-08 ENCOUNTER — Other Ambulatory Visit: Payer: Self-pay | Admitting: Family Medicine

## 2015-12-08 NOTE — Telephone Encounter (Signed)
Refilled 10/13/15 and last seen on the same day. Please advise?

## 2015-12-08 NOTE — Telephone Encounter (Signed)
faxed

## 2015-12-29 ENCOUNTER — Ambulatory Visit: Payer: 59 | Admitting: Neurology

## 2015-12-29 ENCOUNTER — Telehealth: Payer: Self-pay | Admitting: Family Medicine

## 2015-12-29 NOTE — Telephone Encounter (Signed)
Refill sent to pharmacy. Patient needs to come in to the office to have lab work done as it has been over a year since he had any lab work done for this medication. Please let him know this and then I will order the labs. He also needs to get future refills from neurology. He needs to set up an appointment with neurology.

## 2015-12-29 NOTE — Telephone Encounter (Signed)
Refilled 10/10/15. Pt last seen 10/13/15 and per note it states Dr.Cook wanted for pt to see neurology so they were able to prescribe this medication. Spoke with Micheline Maze and she stated that neurology has tried to get in contact with pt and was unsuccessful. Micheline Maze is sending new one to [facility]. Please advise?

## 2015-12-29 NOTE — Telephone Encounter (Signed)
Pt stated that he will schedule a lab appt once his finances improves. He's aware of Dr. Caryl Bis statement

## 2015-12-29 NOTE — Telephone Encounter (Signed)
LVTCB

## 2016-01-30 ENCOUNTER — Other Ambulatory Visit: Payer: Self-pay | Admitting: Family Medicine

## 2016-01-30 NOTE — Telephone Encounter (Signed)
Please advise on refill. Cook patient  

## 2016-02-21 ENCOUNTER — Ambulatory Visit (INDEPENDENT_AMBULATORY_CARE_PROVIDER_SITE_OTHER): Payer: 59 | Admitting: Neurology

## 2016-02-21 ENCOUNTER — Encounter: Payer: Self-pay | Admitting: Neurology

## 2016-02-21 VITALS — BP 148/82 | HR 80 | Ht 75.0 in | Wt 265.6 lb

## 2016-02-21 DIAGNOSIS — Z79899 Other long term (current) drug therapy: Secondary | ICD-10-CM

## 2016-02-21 DIAGNOSIS — G40209 Localization-related (focal) (partial) symptomatic epilepsy and epileptic syndromes with complex partial seizures, not intractable, without status epilepticus: Secondary | ICD-10-CM | POA: Diagnosis not present

## 2016-02-21 MED ORDER — PHENYTOIN SODIUM EXTENDED 100 MG PO CAPS: ORAL_CAPSULE | ORAL | 3 refills | Status: DC
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
## 2016-03-05 ENCOUNTER — Ambulatory Visit
Admission: RE | Admit: 2016-03-05 | Discharge: 2016-03-05 | Disposition: A | Discharge status: Home / Self Care | Payer: 59 | Source: Ambulatory Visit | Attending: Neurology | Admitting: Neurology

## 2016-03-05 ENCOUNTER — Other Ambulatory Visit (INDEPENDENT_AMBULATORY_CARE_PROVIDER_SITE_OTHER): Payer: 59

## 2016-03-05 ENCOUNTER — Other Ambulatory Visit: Payer: Self-pay

## 2016-03-05 DIAGNOSIS — Z79899 Other long term (current) drug therapy: Secondary | ICD-10-CM

## 2016-03-05 DIAGNOSIS — G40209 Localization-related (focal) (partial) symptomatic epilepsy and epileptic syndromes with complex partial seizures, not intractable, without status epilepticus: Secondary | ICD-10-CM

## 2016-03-05 LAB — VITAMIN D 25 HYDROXY (VIT D DEFICIENCY, FRACTURES): VITD: 31.95 ng/mL (ref 30.00–100.00)

## 2016-03-06 ENCOUNTER — Telehealth: Payer: Self-pay

## 2016-03-06 LAB — PHENYTOIN LEVEL, TOTAL: Phenytoin Lvl: 12.8 ug/mL (ref 10.0–20.0)

## 2016-03-06 NOTE — Telephone Encounter (Signed)
-----   Message from Pieter Partridge, DO sent at 03/06/2016  7:35 AM EST ----- Dilantin level looks okay.  Vitamin D level is within normal range but on the low-end.  Dr. Delice Lesch will determine whether he should increase his vitamin D dose.

## 2016-03-06 NOTE — Telephone Encounter (Signed)
Patient notified

## 2016-03-07 ENCOUNTER — Other Ambulatory Visit: Payer: Self-pay | Admitting: Family Medicine

## 2016-03-07 NOTE — Telephone Encounter (Signed)
Faxed

## 2016-03-07 NOTE — Telephone Encounter (Signed)
Refilled 12/08/15. Last seen 10/13/15. please advise?

## 2016-03-09 ENCOUNTER — Encounter: Payer: Self-pay | Admitting: Neurology

## 2016-03-09 ENCOUNTER — Telehealth: Payer: Self-pay

## 2016-03-09 NOTE — Telephone Encounter (Signed)
-----   Message from Cameron Sprang, MD sent at 03/09/2016 11:14 AM EST ----- Pls let him know bone density scan normal, no evidence of osteoporosis, need to continue daily calcium and vitamin D supplementation while on his seizure medication. Thanks

## 2016-03-09 NOTE — Telephone Encounter (Signed)
Notified patient. Verbalized understanding.

## 2016-03-16 ENCOUNTER — Telehealth: Payer: Self-pay | Admitting: Neurology

## 2016-03-16 NOTE — Telephone Encounter (Signed)
Contacted patient. 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 Patient states he understands if this is not possible with him only been seen at this practice once.

## 2016-03-16 NOTE — Telephone Encounter (Signed)
Patient needs to talk to someone about somethings with work please call [phone]

## 2016-03-30 ENCOUNTER — Encounter: Payer: Self-pay | Admitting: Neurology

## 2016-03-30 NOTE — Telephone Encounter (Signed)
Letter ready, thanks

## 2016-03-30 NOTE — Telephone Encounter (Signed)
LVM for patient that letter is complete. We need to know if it needs faxed or if he would like to pick it up.

## 2016-03-30 NOTE — Telephone Encounter (Signed)
Patient called. He wants letter faxed to [place].

## 2016-04-27 ENCOUNTER — Other Ambulatory Visit: Payer: Self-pay | Admitting: Nurse Practitioner

## 2016-04-27 ENCOUNTER — Other Ambulatory Visit: Payer: Self-pay | Admitting: Family Medicine

## 2016-04-30 NOTE — Telephone Encounter (Signed)
Xanax was refilled 03/07/16.  Flexeril refilled 08/03/15 by carrie. Pt last seen 10/13/15.  Please advise?

## 2016-05-01 MED ORDER — ALPRAZOLAM 0.5 MG PO TABS: ORAL_TABLET | ORAL | 0 refills | Status: DC

## 2016-05-01 MED ORDER — CYCLOBENZAPRINE HCL 10 MG PO TABS: 10.0000 mg | ORAL_TABLET | Freq: Three times a day (TID) | ORAL | 0 refills | Status: DC | PRN

## 2016-05-01 NOTE — Telephone Encounter (Signed)
faxed

## 2016-08-04 ENCOUNTER — Other Ambulatory Visit: Payer: Self-pay | Admitting: Family Medicine

## 2016-08-06 NOTE — Telephone Encounter (Signed)
Last refilled on 04/27/16, no upcoming appt. Please advise, thanks

## 2016-08-06 NOTE — Telephone Encounter (Signed)
faxed

## 2016-09-22 ENCOUNTER — Other Ambulatory Visit: Payer: Self-pay | Admitting: Family Medicine

## 2016-10-25 ENCOUNTER — Other Ambulatory Visit: Payer: Self-pay | Admitting: Family Medicine

## 2016-10-26 NOTE — Telephone Encounter (Signed)
Script faxed to [facility].

## 2016-10-26 NOTE — Telephone Encounter (Signed)
Last OV was in Dec 2017, Last refill was 08/04/2016, please advise, thanks

## 2016-11-13 DIAGNOSIS — M9901 Segmental and somatic dysfunction of cervical region: Secondary | ICD-10-CM | POA: Diagnosis not present

## 2016-11-13 DIAGNOSIS — M4712 Other spondylosis with myelopathy, cervical region: Secondary | ICD-10-CM | POA: Diagnosis not present

## 2016-11-13 DIAGNOSIS — M542 Cervicalgia: Secondary | ICD-10-CM | POA: Diagnosis not present

## 2016-11-20 ENCOUNTER — Other Ambulatory Visit: Payer: Self-pay | Admitting: Family Medicine

## 2016-11-20 DIAGNOSIS — M4712 Other spondylosis with myelopathy, cervical region: Secondary | ICD-10-CM | POA: Diagnosis not present

## 2016-11-20 DIAGNOSIS — M542 Cervicalgia: Secondary | ICD-10-CM | POA: Diagnosis not present

## 2016-11-20 DIAGNOSIS — M9901 Segmental and somatic dysfunction of cervical region: Secondary | ICD-10-CM | POA: Diagnosis not present

## 2016-11-20 NOTE — Telephone Encounter (Signed)
Last OV 10/13/2015 Next OV not scheduled Last refill 09/24/2016

## 2016-11-22 DIAGNOSIS — M542 Cervicalgia: Secondary | ICD-10-CM | POA: Diagnosis not present

## 2016-11-22 DIAGNOSIS — M9901 Segmental and somatic dysfunction of cervical region: Secondary | ICD-10-CM | POA: Diagnosis not present

## 2016-11-22 DIAGNOSIS — M4712 Other spondylosis with myelopathy, cervical region: Secondary | ICD-10-CM | POA: Diagnosis not present

## 2016-11-27 DIAGNOSIS — M4712 Other spondylosis with myelopathy, cervical region: Secondary | ICD-10-CM | POA: Diagnosis not present

## 2016-11-27 DIAGNOSIS — M542 Cervicalgia: Secondary | ICD-10-CM | POA: Diagnosis not present

## 2016-11-27 DIAGNOSIS — M9901 Segmental and somatic dysfunction of cervical region: Secondary | ICD-10-CM | POA: Diagnosis not present

## 2016-11-29 DIAGNOSIS — M542 Cervicalgia: Secondary | ICD-10-CM | POA: Diagnosis not present

## 2016-11-29 DIAGNOSIS — M4712 Other spondylosis with myelopathy, cervical region: Secondary | ICD-10-CM | POA: Diagnosis not present

## 2016-11-29 DIAGNOSIS — M9901 Segmental and somatic dysfunction of cervical region: Secondary | ICD-10-CM | POA: Diagnosis not present

## 2016-11-30 DIAGNOSIS — Z79899 Other long term (current) drug therapy: Secondary | ICD-10-CM | POA: Diagnosis not present

## 2016-11-30 DIAGNOSIS — Z0000 Encounter for general adult medical examination without abnormal findings: Secondary | ICD-10-CM | POA: Diagnosis not present

## 2016-11-30 DIAGNOSIS — I10 Essential (primary) hypertension: Secondary | ICD-10-CM | POA: Insufficient documentation

## 2016-11-30 DIAGNOSIS — R569 Unspecified convulsions: Secondary | ICD-10-CM

## 2016-11-30 DIAGNOSIS — F419 Anxiety disorder, unspecified: Secondary | ICD-10-CM | POA: Insufficient documentation

## 2016-11-30 HISTORY — DX: Unspecified convulsions: R56.9

## 2016-11-30 HISTORY — DX: Essential (primary) hypertension: I10

## 2016-12-06 DIAGNOSIS — M4712 Other spondylosis with myelopathy, cervical region: Secondary | ICD-10-CM | POA: Diagnosis not present

## 2016-12-06 DIAGNOSIS — M542 Cervicalgia: Secondary | ICD-10-CM | POA: Diagnosis not present

## 2016-12-06 DIAGNOSIS — M9901 Segmental and somatic dysfunction of cervical region: Secondary | ICD-10-CM | POA: Diagnosis not present

## 2017-02-20 ENCOUNTER — Ambulatory Visit (INDEPENDENT_AMBULATORY_CARE_PROVIDER_SITE_OTHER): Payer: 59 | Admitting: Neurology

## 2017-02-20 ENCOUNTER — Other Ambulatory Visit: Payer: 59

## 2017-02-20 ENCOUNTER — Encounter: Payer: Self-pay | Admitting: Neurology

## 2017-02-20 VITALS — BP 134/88 | HR 59 | Ht 74.0 in | Wt 267.0 lb

## 2017-02-20 DIAGNOSIS — G40209 Localization-related (focal) (partial) symptomatic epilepsy and epileptic syndromes with complex partial seizures, not intractable, without status epilepticus: Secondary | ICD-10-CM
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
## 2017-02-22 ENCOUNTER — Encounter: Payer: Self-pay | Admitting: Neurology

## 2017-02-24 ENCOUNTER — Other Ambulatory Visit: Payer: Self-pay | Admitting: Neurology

## 2017-02-24 DIAGNOSIS — G40209 Localization-related (focal) (partial) symptomatic epilepsy and epileptic syndromes with complex partial seizures, not intractable, without status epilepticus: Secondary | ICD-10-CM

## 2017-03-01 ENCOUNTER — Other Ambulatory Visit: Payer: 59

## 2017-03-01 DIAGNOSIS — G40209 Localization-related (focal) (partial) symptomatic epilepsy and epileptic syndromes with complex partial seizures, not intractable, without status epilepticus: Secondary | ICD-10-CM

## 2017-03-02 LAB — PHENYTOIN LEVEL, TOTAL: PHENYTOIN, TOTAL: 12.5 mg/L (ref 10.0–20.0)

## 2017-03-04 ENCOUNTER — Ambulatory Visit (INDEPENDENT_AMBULATORY_CARE_PROVIDER_SITE_OTHER): Payer: 59 | Admitting: Neurology

## 2017-03-04 DIAGNOSIS — G40209 Localization-related (focal) (partial) symptomatic epilepsy and epileptic syndromes with complex partial seizures, not intractable, without status epilepticus: Secondary | ICD-10-CM

## 2017-03-07 ENCOUNTER — Encounter: Payer: Self-pay | Admitting: Neurology

## 2017-03-07 ENCOUNTER — Telehealth: Payer: Self-pay

## 2017-03-07 NOTE — Telephone Encounter (Signed)
-----   Message from Cameron Sprang, MD sent at 03/07/2017 10:02 AM EST ----- Pls let him know the routine EEG is normal, proceed with 48-hour EEG, pls remind him to write down his symptoms when they occur on the diary that Manuela Schwartz gives him. Thanks

## 2017-03-07 NOTE — Telephone Encounter (Signed)
Results relayed via MyChart

## 2017-03-07 NOTE — Procedures (Signed)
ELECTROENCEPHALOGRAM REPORT  Date of Study: 03/04/2017  Patient's Name: Thomas Cole MRN: [id number] Date of Birth: 09-02-1970  Referring Provider: Dr. Ellouise Newer  Clinical History: This is a 46 year old man with a history of seizures, with new symptoms of recurrent transient mild confusion. EEG for classification  Medications: DILANTIN 100 MG ER capsule  PROVENTIL HFA;VENTOLIN HFA 108 (90 BASE) MCG/ACT inhaler XANAX 0.5 MG tablet FLEXERIL 10 MG tablet  LASIX 20 MG tablet HYZAAR 50-12.5 MG tablet  PRILOSEC 20 MG capsule ULTRAM 50 MG tablet  Technical Summary: A multichannel digital 1-hour sleep-deprived EEG recording measured by the international 10-20 system with electrodes applied with paste and impedances below 5000 ohms performed in our laboratory with EKG monitoring in an awake and asleep patient.  Hyperventilation and photic stimulation were performed.  The digital EEG was referentially recorded, reformatted, and digitally filtered in a variety of bipolar and referential montages for optimal display.    Description: The patient is awake and asleep during the recording.  During maximal wakefulness, there is a symmetric, medium voltage 10 Hz posterior dominant rhythm that attenuates with eye opening.  The record is symmetric.  During drowsiness and sleep, there is an increase in theta slowing of the background.  Vertex waves and symmetric sleep spindles were seen.  Hyperventilation and photic stimulation did not elicit any abnormalities.  There were no epileptiform discharges or electrographic seizures seen.    EKG lead was unremarkable.  Impression: This 1-hour awake and asleep EEG is normal.    Clinical Correlation: A normal EEG does not exclude a clinical diagnosis of epilepsy.  If further clinical questions remain, prolonged EEG may be helpful.  Clinical correlation is advised.   Ellouise Newer, M.D.

## 2017-03-16 ENCOUNTER — Encounter: Payer: Self-pay | Admitting: Neurology

## 2017-03-27 ENCOUNTER — Emergency Department
Admission: EM | Admit: 2017-03-27 | Discharge: 2017-03-27 | Disposition: A | Discharge status: Home / Self Care | Payer: 59 | Source: Home / Self Care | Attending: Emergency Medicine | Admitting: Emergency Medicine

## 2017-03-27 ENCOUNTER — Emergency Department: Payer: 59

## 2017-03-27 DIAGNOSIS — K810 Acute cholecystitis: Secondary | ICD-10-CM | POA: Diagnosis not present

## 2017-03-27 DIAGNOSIS — Z79899 Other long term (current) drug therapy: Secondary | ICD-10-CM

## 2017-03-27 DIAGNOSIS — R102 Pelvic and perineal pain: Secondary | ICD-10-CM | POA: Diagnosis not present

## 2017-03-27 DIAGNOSIS — K5900 Constipation, unspecified: Secondary | ICD-10-CM | POA: Insufficient documentation

## 2017-03-27 DIAGNOSIS — I10 Essential (primary) hypertension: Secondary | ICD-10-CM | POA: Insufficient documentation

## 2017-03-27 DIAGNOSIS — R1012 Left upper quadrant pain: Secondary | ICD-10-CM

## 2017-03-27 DIAGNOSIS — J4520 Mild intermittent asthma, uncomplicated: Secondary | ICD-10-CM

## 2017-03-27 LAB — URINALYSIS, COMPLETE (UACMP) WITH MICROSCOPIC
BILIRUBIN URINE: NEGATIVE
Bacteria, UA: NONE SEEN
Glucose, UA: NEGATIVE mg/dL
Hgb urine dipstick: NEGATIVE
KETONES UR: NEGATIVE mg/dL
LEUKOCYTES UA: NEGATIVE
Nitrite: NEGATIVE
PH: 6 (ref 5.0–8.0)
Protein, ur: NEGATIVE mg/dL
SQUAMOUS EPITHELIAL / LPF: NONE SEEN
Specific Gravity, Urine: >1.046 — ABNORMAL HIGH (ref 1.005–1.030)

## 2017-03-27 LAB — CBC
HEMATOCRIT: 47.9 % (ref 40.0–52.0)
Hemoglobin: 16.3 g/dL (ref 13.0–18.0)
MCH: 30.3 pg (ref 26.0–34.0)
MCHC: 34.1 g/dL (ref 32.0–36.0)
MCV: 88.8 fL (ref 80.0–100.0)
PLATELETS: 227 10*3/uL (ref 150–440)
RBC: 5.39 MIL/uL (ref 4.40–5.90)
RDW: 13.5 % (ref 11.5–14.5)
WBC: 16.6 10*3/uL — AB (ref 3.8–10.6)

## 2017-03-27 LAB — COMPREHENSIVE METABOLIC PANEL
ALT: 25 U/L (ref 17–63)
ANION GAP: 10 (ref 5–15)
AST: 35 U/L (ref 15–41)
Albumin: 4.6 g/dL (ref 3.5–5.0)
Alkaline Phosphatase: 89 U/L (ref 38–126)
BUN: 21 mg/dL — ABNORMAL HIGH (ref 6–20)
CHLORIDE: 106 mmol/L (ref 101–111)
CO2: 22 mmol/L (ref 22–32)
Calcium: 9.5 mg/dL (ref 8.9–10.3)
Creatinine, Ser: 0.99 mg/dL (ref 0.61–1.24)
Glucose, Bld: 97 mg/dL (ref 65–99)
POTASSIUM: 3.6 mmol/L (ref 3.5–5.1)
Sodium: 138 mmol/L (ref 135–145)
Total Bilirubin: 0.5 mg/dL (ref 0.3–1.2)
Total Protein: 7.9 g/dL (ref 6.5–8.1)

## 2017-03-27 LAB — LIPASE, BLOOD: LIPASE: 38 U/L (ref 11–51)

## 2017-03-27 LAB — TROPONIN I: Troponin I: <0.03 ng/mL (ref <0.03)

## 2017-03-27 MED ORDER — ONDANSETRON HCL 4 MG/2ML IJ SOLN
INTRAMUSCULAR | Status: AC
  Administered 2017-03-27: 4 mg
  Filled 2017-03-27: qty 2

## 2017-03-27 MED ORDER — SODIUM CHLORIDE 0.9 % IV BOLUS (SEPSIS)
1000.0000 mL | Freq: Once | INTRAVENOUS | Status: AC
  Administered 2017-03-27: 1000 mL via INTRAVENOUS

## 2017-03-27 MED ORDER — ONDANSETRON HCL 4 MG/2ML IJ SOLN
4.0000 mg | Freq: Once | INTRAMUSCULAR | Status: AC
  Administered 2017-03-27: 4 mg via INTRAVENOUS
  Filled 2017-03-27: qty 2

## 2017-03-27 MED ORDER — DOCUSATE SODIUM 100 MG PO CAPS: 100.0000 mg | ORAL_CAPSULE | Freq: Two times a day (BID) | ORAL | 0 refills | Status: AC

## 2017-03-27 MED ORDER — MORPHINE SULFATE (PF) 4 MG/ML IV SOLN
INTRAVENOUS | Status: AC
  Administered 2017-03-27: 4 mg
  Filled 2017-03-27: qty 1

## 2017-03-27 MED ORDER — HYDROMORPHONE HCL 1 MG/ML IJ SOLN
1.0000 mg | Freq: Once | INTRAMUSCULAR | Status: AC
  Administered 2017-03-27: 1 mg via INTRAVENOUS
  Filled 2017-03-27: qty 1

## 2017-03-27 MED ORDER — POLYETHYLENE GLYCOL 3350 17 GM/SCOOP PO POWD: 17.0000 g | Freq: Two times a day (BID) | ORAL | 0 refills | Status: DC

## 2017-03-27 MED ORDER — IOPAMIDOL (ISOVUE-300) INJECTION 61%
30.0000 mL | Freq: Once | INTRAVENOUS | Status: AC | PRN
  Administered 2017-03-27: 30 mL via ORAL

## 2017-03-27 MED ORDER — IOPAMIDOL (ISOVUE-370) INJECTION 76%
100.0000 mL | Freq: Once | INTRAVENOUS | Status: AC | PRN
  Administered 2017-03-27: 100 mL via INTRAVENOUS

## 2017-03-27 NOTE — ED Provider Notes (Signed)
[facility] Emergency Department Provider Note  Time seen: 7:08 AM  I have reviewed the triage vital signs and the nursing notes.   HISTORY  Chief Complaint Abdominal Pain    HPI Thomas Cole is a 46 y.o. male with a past medical history of anxiety, asthma, gastric reflux, hypertension, presents to the emergency department for left-sided abdominal pain.  According to the patient he woke up around 3:00 this morning with significant left-sided abdominal pain.  Patient states this happened once previously with left-sided abdominal pain that resolved after several hours.  With this morning patient states the pain continued to worsen so he came to the emergency department for evaluation.  Patient denies any nausea or vomiting.  States a normal bowel movement yesterday, denies constipation.  Denies diarrhea.  Denies dysuria or or hematuria.  States one possible kidney stone previously.  Describes his abdominal pain as severe currently.  Does state mild radiation into the left chest, but no shortness of breath nausea or diaphoresis.   Past Medical History:  Diagnosis Date  . Anxiety   . Asthma   . Back problem   . Complication of anesthesia    occ takes longer to wake  . Depression   . GERD (gastroesophageal reflux disease)   . Hypertension   . Migraine headache   . Seizures ([place])    last 87  . Shortness of breath     Patient Active Problem List   Diagnosis Date Noted  . Localization-related symptomatic epilepsy and epileptic syndromes with complex partial seizures, not intractable, without status epilepticus ([place]) 02/21/2016  . Generalized anxiety disorder 06/23/2015  . Major depressive disorder, recurrent episode, severe ([place]) 06/23/2015  . PTSD (post-traumatic stress disorder) 06/16/2015  . SOB (shortness of breath) 08/06/2014  . Obesity (BMI 30-39.9) 11/11/2013  . Migraines 11/11/2013  . Asthma, mild intermittent 11/11/2013  . History of seizures  11/11/2013  . GERD (gastroesophageal reflux disease) 11/11/2013  . Depression 11/11/2013  . Labile hypertension 03/19/2013  . Cervical spondylosis without myelopathy 09/17/2012  . Sleep apnea 05/25/2010    Past Surgical History:  Procedure Laterality Date  . ANTERIOR CERVICAL CORPECTOMY N/A 09/15/2012   Procedure: Cervical Three-Four,Cervical Six-Seven Anterior cervical decompression/diskectomy fusion with Cervical Four Corpectomy;  Surgeon: Kristeen Miss, MD;  Location: [place] [facility];  Service: Neurosurgery;  Laterality: N/A;  Cervical Three-Four,Cervical Six-Seven  Anterior cervical decompression/diskectomy fusion with Cervical four Corpectomy  . APPENDECTOMY  12  . CERVICAL DISC SURGERY    . KNEE SURGERY     bilateral  . POSTERIOR FUSION CERVICAL SPINE      Prior to Admission medications   Medication Sig Start Date End Date Taking? Authorizing Provider  cyclobenzaprine (FLEXERIL) 10 MG tablet TAKE 1 TABLET (10 MG TOTAL) BY MOUTH 3 (THREE) TIMES DAILY AS NEEDED FOR MUSCLE SPASMS. 10/26/16  Yes Cook, Jayce G, DO  losartan-hydrochlorothiazide (HYZAAR) 50-12.5 MG tablet Take 1 tablet by mouth daily.  11/30/16 11/30/17 Yes [provider]  omeprazole (PRILOSEC) 20 MG capsule Take x 2 x 30 m before bfast 09/16/14  Yes Tanda Rockers, MD  phenytoin (DILANTIN) 100 MG ER capsule TAKE 2 CAPSULES BY MOUTH EVERY MORNING AND TAKE 3 CAPSULES BY MOUTH EVERY EVENING 02/25/17  Yes Cameron Sprang, MD  albuterol (PROVENTIL HFA;VENTOLIN HFA) 108 (90 BASE) MCG/ACT inhaler Inhale 2 puffs into the lungs every 6 (six) hours as needed for wheezing or shortness of breath. Patient not taking: Reported on 03/27/2017 11/11/13   Garnette Gunner,  Coralie Keens, NP  ALPRAZolam Duanne Moron) 0.5 MG tablet TAKE 1 TABLET BY MOUTH EVERY DAY AT BEDTIME AS NEEDED FOR ANXIETY Patient not taking: Reported on 03/27/2017 10/26/16   Coral Spikes, DO  furosemide (LASIX) 20 MG tablet TAKE 1 TABLET (20 MG TOTAL) BY MOUTH DAILY. Patient not  taking: Reported on 03/27/2017 10/01/14   Rubbie Battiest, RN  traMADol (ULTRAM) 50 MG tablet Take 1 tablet (50 mg total) by mouth every 8 (eight) hours as needed. Patient not taking: Reported on 03/27/2017 09/02/14   Rubbie Battiest, RN    Allergies  Allergen Reactions  . Amitriptyline Other (See Comments)    Felt really bad- psychologically  . Bee Venom Itching and Swelling  . Chlorhexidine Itching    REACTION TO WIPES/ CLOTHES ONLY==he can tolerate the SCRUB LIQUID  . Carbamazepine Itching and Rash  . Meloxicam Itching and Rash  . Sulfa Antibiotics Itching and Rash    Family History  Problem Relation Age of Onset  . Coronary artery disease Mother   . Hypertension Mother   . Mental illness Mother   . Arthritis Mother   . Asthma Mother   . Coronary artery disease Father   . Heart disease Father   . Arthritis Father   . Arthritis Maternal Grandmother   . Heart disease Maternal Grandmother   . Hypertension Maternal Grandmother   . Arthritis Maternal Grandfather   . Heart disease Maternal Grandfather   . Hypertension Maternal Grandfather   . Arthritis Paternal Grandmother   . Heart disease Paternal Grandmother   . Hypertension Paternal Grandmother   . Arthritis Paternal Grandfather   . Heart disease Paternal Grandfather   . Hypertension Paternal Grandfather   . Cancer Neg Hx   . Diabetes Neg Hx   . Stroke Neg Hx     Social History Social History   Tobacco Use  . Smoking status: Never Smoker  . Smokeless tobacco: Never Used  Substance Use Topics  . Alcohol use: No    Alcohol/week: 0.0 oz  . Drug use: No    Review of Systems Constitutional: Negative for fever. Cardiovascular: Negative for chest pain. Respiratory: Negative for shortness of breath. Gastrointestinal: Left-sided abdominal pain.  Negative for nausea vomiting or diarrhea Genitourinary: Negative for dysuria.  Negative hematuria Musculoskeletal: Negative for back pain. Neurological: Negative for  headache All other ROS negative  ____________________________________________   PHYSICAL EXAM:  Constitutional: Alert and oriented.  Appears somewhat uncomfortable due to abdominal pain. Eyes: Normal exam ENT   Head: Normocephalic and atraumatic.   Mouth/Throat: Mucous membranes are moist. Cardiovascular: Normal rate, regular rhythm. No murmur Respiratory: Normal respiratory effort without tachypnea nor retractions. Breath sounds are clear Gastrointestinal: Soft and nontender. No distention.  Musculoskeletal: Nontender with normal range of motion in all extremities. Neurologic:  Normal speech and language. No gross focal neurologic deficits Skin:  Skin is warm, dry and intact.  Psychiatric: Mood and affect are normal.   ____________________________________________    EKG  EKG reviewed and interpreted by myself shows normal sinus rhythm at 71 bpm with a borderline widened QRS, normal axis, normal intervals with nonspecific ST changes.  ____________________________________________    RADIOLOGY  1. No acute findings are noted in the abdomen or pelvis to account for the patient's symptoms. 2. Status post appendectomy. 3. Incidental findings, as above.  IMPRESSION: 1. Moderately large amount of colonic stool. No evidence of bowel obstruction. 2. Mild bronchitic changes.  ____________________________________________   INITIAL IMPRESSION / ASSESSMENT AND PLAN /  ED COURSE  Pertinent labs & imaging results that were available during my care of the patient were reviewed by me and considered in my medical decision making (see chart for details).  Patient presents to the emergency department for left-sided abdominal pain starting at 3:00 this morning.  Differential would include colitis, diverticulitis, ureterolithiasis, constipation, SBO, pyelonephritis.  We will check labs and closely monitor in the emergency department.  Patient's lab work has resulted with a moderate  leukocytosis of 16,000 otherwise largely within normal limits including normal LFTs, lipase and troponin.  Patient is a largely nontender abdomen on exam.  We will proceed with a CT scan to further evaluate given the patient's degree of discomfort and moderate leukocytosis.  X-ray consistent with a large stool burden otherwise negative.  CT scan shows no acute abnormality.  Patient is feeling better after pain medication but is very tired per patient.  We will continue to monitor in the emergency department.  Given overall normal labs are normal CT scan besides stool burden I believe the patient's symptoms are most likely related to constipation we will prescribe MiraLAX.  Patient agreeable with this plan of care.  ____________________________________________   FINAL CLINICAL IMPRESSION(S) / ED DIAGNOSES  Left-sided abdominal pain Constipation   Harvest Dark, MD 03/27/17 [phone]

## 2017-03-27 NOTE — ED Notes (Addendum)
Patient able to stand and use urinal. Specimen collected and sent to lab.

## 2017-03-27 NOTE — ED Notes (Signed)
AAOx3.  Skin warm and dry.  NAD 

## 2017-03-27 NOTE — Discharge Instructions (Signed)
Please take your medications as prescribed.  Please follow-up with your primary care doctor within the next several days for recheck/reevaluation if you continue to have symptoms.  Return to the emergency department for any significant worsening of abdominal pain, if you develop a fever, or any other symptom personally concerning to yourself.

## 2017-03-27 NOTE — ED Triage Notes (Signed)
Awoke from sleep with 10/10 pain to left upper abd area. Sharp burning pain that crosses over to his chest

## 2017-03-29 ENCOUNTER — Other Ambulatory Visit: Payer: Self-pay

## 2017-03-29 ENCOUNTER — Inpatient Hospital Stay: Payer: 59 | Admitting: Anesthesiology

## 2017-03-29 ENCOUNTER — Encounter: Payer: Self-pay | Admitting: Emergency Medicine

## 2017-03-29 ENCOUNTER — Encounter
Admission: EM | Disposition: A | Discharge status: Home / Self Care | Payer: Self-pay | Source: Home / Self Care | Attending: Surgery

## 2017-03-29 ENCOUNTER — Emergency Department: Payer: 59

## 2017-03-29 ENCOUNTER — Inpatient Hospital Stay
Admission: EM | Admit: 2017-03-29 | Discharge: 2017-04-01 | DRG: 419 | Disposition: A | Discharge status: Home / Self Care | Payer: 59 | Attending: Surgery | Admitting: Surgery

## 2017-03-29 DIAGNOSIS — R1013 Epigastric pain: Secondary | ICD-10-CM | POA: Diagnosis not present

## 2017-03-29 DIAGNOSIS — Z888 Allergy status to other drugs, medicaments and biological substances status: Secondary | ICD-10-CM

## 2017-03-29 DIAGNOSIS — F329 Major depressive disorder, single episode, unspecified: Secondary | ICD-10-CM | POA: Diagnosis present

## 2017-03-29 DIAGNOSIS — Z881 Allergy status to other antibiotic agents status: Secondary | ICD-10-CM

## 2017-03-29 DIAGNOSIS — K810 Acute cholecystitis: Secondary | ICD-10-CM | POA: Diagnosis not present

## 2017-03-29 DIAGNOSIS — K219 Gastro-esophageal reflux disease without esophagitis: Secondary | ICD-10-CM | POA: Diagnosis present

## 2017-03-29 DIAGNOSIS — K824 Cholesterolosis of gallbladder: Secondary | ICD-10-CM | POA: Diagnosis not present

## 2017-03-29 DIAGNOSIS — Z79899 Other long term (current) drug therapy: Secondary | ICD-10-CM | POA: Diagnosis not present

## 2017-03-29 DIAGNOSIS — F411 Generalized anxiety disorder: Secondary | ICD-10-CM | POA: Diagnosis present

## 2017-03-29 DIAGNOSIS — I10 Essential (primary) hypertension: Secondary | ICD-10-CM | POA: Diagnosis present

## 2017-03-29 DIAGNOSIS — Z981 Arthrodesis status: Secondary | ICD-10-CM | POA: Diagnosis not present

## 2017-03-29 DIAGNOSIS — K82A1 Gangrene of gallbladder in cholecystitis: Secondary | ICD-10-CM | POA: Diagnosis present

## 2017-03-29 DIAGNOSIS — Z8249 Family history of ischemic heart disease and other diseases of the circulatory system: Secondary | ICD-10-CM

## 2017-03-29 DIAGNOSIS — K828 Other specified diseases of gallbladder: Secondary | ICD-10-CM | POA: Diagnosis not present

## 2017-03-29 DIAGNOSIS — Z91030 Bee allergy status: Secondary | ICD-10-CM | POA: Diagnosis not present

## 2017-03-29 DIAGNOSIS — R109 Unspecified abdominal pain: Secondary | ICD-10-CM

## 2017-03-29 DIAGNOSIS — F4310 Post-traumatic stress disorder, unspecified: Secondary | ICD-10-CM | POA: Diagnosis present

## 2017-03-29 DIAGNOSIS — Z79891 Long term (current) use of opiate analgesic: Secondary | ICD-10-CM

## 2017-03-29 DIAGNOSIS — K8000 Calculus of gallbladder with acute cholecystitis without obstruction: Secondary | ICD-10-CM | POA: Diagnosis not present

## 2017-03-29 DIAGNOSIS — R1011 Right upper quadrant pain: Secondary | ICD-10-CM | POA: Diagnosis not present

## 2017-03-29 DIAGNOSIS — J4520 Mild intermittent asthma, uncomplicated: Secondary | ICD-10-CM | POA: Diagnosis not present

## 2017-03-29 HISTORY — PX: CHOLECYSTECTOMY: SHX55

## 2017-03-29 LAB — CBC
HCT: 46.2 % (ref 40.0–52.0)
Hemoglobin: 15.9 g/dL (ref 13.0–18.0)
MCH: 30.4 pg (ref 26.0–34.0)
MCHC: 34.5 g/dL (ref 32.0–36.0)
MCV: 88.3 fL (ref 80.0–100.0)
PLATELETS: 209 10*3/uL (ref 150–440)
RBC: 5.23 MIL/uL (ref 4.40–5.90)
RDW: 13.3 % (ref 11.5–14.5)
WBC: 14.3 10*3/uL — AB (ref 3.8–10.6)

## 2017-03-29 LAB — COMPREHENSIVE METABOLIC PANEL
ALK PHOS: 86 U/L (ref 38–126)
ALT: 26 U/L (ref 17–63)
AST: 30 U/L (ref 15–41)
Albumin: 4.3 g/dL (ref 3.5–5.0)
Anion gap: 12 (ref 5–15)
BILIRUBIN TOTAL: 1.5 mg/dL — AB (ref 0.3–1.2)
BUN: 17 mg/dL (ref 6–20)
CALCIUM: 9.2 mg/dL (ref 8.9–10.3)
CHLORIDE: 102 mmol/L (ref 101–111)
CO2: 21 mmol/L — ABNORMAL LOW (ref 22–32)
CREATININE: 0.94 mg/dL (ref 0.61–1.24)
Glucose, Bld: 102 mg/dL — ABNORMAL HIGH (ref 65–99)
Potassium: 3.1 mmol/L — ABNORMAL LOW (ref 3.5–5.1)
Sodium: 135 mmol/L (ref 135–145)
TOTAL PROTEIN: 7.8 g/dL (ref 6.5–8.1)

## 2017-03-29 LAB — LIPASE, BLOOD: LIPASE: 29 U/L (ref 11–51)

## 2017-03-29 LAB — URINALYSIS, COMPLETE (UACMP) WITH MICROSCOPIC
BILIRUBIN URINE: NEGATIVE
Bacteria, UA: NONE SEEN
GLUCOSE, UA: NEGATIVE mg/dL
Hgb urine dipstick: NEGATIVE
KETONES UR: 20 mg/dL — AB
LEUKOCYTES UA: NEGATIVE
NITRITE: NEGATIVE
PH: 6 (ref 5.0–8.0)
Protein, ur: 30 mg/dL — AB
Specific Gravity, Urine: 1.018 (ref 1.005–1.030)
Squamous Epithelial / LPF: NONE SEEN

## 2017-03-29 SURGERY — LAPAROSCOPIC CHOLECYSTECTOMY
Anesthesia: General | Site: Abdomen | Wound class: Contaminated

## 2017-03-29 MED ORDER — MIDAZOLAM HCL 2 MG/2ML IJ SOLN
INTRAMUSCULAR | Status: DC | PRN
  Administered 2017-03-29: 2 mg via INTRAVENOUS

## 2017-03-29 MED ORDER — DEXAMETHASONE SODIUM PHOSPHATE 10 MG/ML IJ SOLN
INTRAMUSCULAR | Status: DC | PRN
  Administered 2017-03-29: 10 mg via INTRAVENOUS

## 2017-03-29 MED ORDER — HYDROMORPHONE HCL 1 MG/ML IJ SOLN
0.5000 mg | INTRAMUSCULAR | Status: DC | PRN
  Administered 2017-03-30 – 2017-03-31 (×7): 0.5 mg via INTRAVENOUS
  Filled 2017-03-29 (×7): qty 0.5

## 2017-03-29 MED ORDER — PROPOFOL 10 MG/ML IV BOLUS
INTRAVENOUS | Status: DC | PRN
  Administered 2017-03-29: 200 mg via INTRAVENOUS

## 2017-03-29 MED ORDER — HYDROCHLOROTHIAZIDE 12.5 MG PO CAPS
12.5000 mg | ORAL_CAPSULE | Freq: Every day | ORAL | Status: DC
  Administered 2017-03-29 – 2017-04-01 (×4): 12.5 mg via ORAL
  Filled 2017-03-29 (×5): qty 1

## 2017-03-29 MED ORDER — LOSARTAN POTASSIUM-HCTZ 50-12.5 MG PO TABS: 1.0000 | ORAL_TABLET | Freq: Every day | ORAL | Status: DC

## 2017-03-29 MED ORDER — HYDROCODONE-ACETAMINOPHEN 5-325 MG PO TABS
1.0000 | ORAL_TABLET | ORAL | Status: DC | PRN
  Administered 2017-03-29 – 2017-03-31 (×8): 1 via ORAL
  Filled 2017-03-29 (×8): qty 1

## 2017-03-29 MED ORDER — HEPARIN SODIUM (PORCINE) 5000 UNIT/ML IJ SOLN
5000.0000 [IU] | Freq: Three times a day (TID) | INTRAMUSCULAR | Status: DC
  Administered 2017-03-29 – 2017-04-01 (×8): 5000 [IU] via SUBCUTANEOUS
  Filled 2017-03-29 (×8): qty 1

## 2017-03-29 MED ORDER — SUGAMMADEX SODIUM 200 MG/2ML IV SOLN
INTRAVENOUS | Status: DC | PRN
  Administered 2017-03-29: 242.2 mg via INTRAVENOUS

## 2017-03-29 MED ORDER — SUCCINYLCHOLINE CHLORIDE 20 MG/ML IJ SOLN
INTRAMUSCULAR | Status: DC | PRN
  Administered 2017-03-29: 120 mg via INTRAVENOUS

## 2017-03-29 MED ORDER — PHENYTOIN SODIUM EXTENDED 100 MG PO CAPS
200.0000 mg | ORAL_CAPSULE | Freq: Once | ORAL | Status: AC
  Administered 2017-03-29: 200 mg via ORAL
  Filled 2017-03-29: qty 2

## 2017-03-29 MED ORDER — FENTANYL CITRATE (PF) 100 MCG/2ML IJ SOLN
INTRAMUSCULAR | Status: AC
  Filled 2017-03-29: qty 2

## 2017-03-29 MED ORDER — LACTATED RINGERS IV SOLN
Freq: Once | INTRAVENOUS | Status: AC
  Administered 2017-03-29: 15:00:00 via INTRAVENOUS

## 2017-03-29 MED ORDER — MORPHINE SULFATE (PF) 2 MG/ML IV SOLN
2.0000 mg | INTRAVENOUS | Status: DC | PRN
  Filled 2017-03-29: qty 1

## 2017-03-29 MED ORDER — ONDANSETRON HCL 4 MG PO TABS: 4.0000 mg | ORAL_TABLET | Freq: Four times a day (QID) | ORAL | Status: DC | PRN

## 2017-03-29 MED ORDER — PROMETHAZINE HCL 25 MG/ML IJ SOLN: 6.2500 mg | INTRAMUSCULAR | Status: DC | PRN

## 2017-03-29 MED ORDER — KETOROLAC TROMETHAMINE 30 MG/ML IJ SOLN
INTRAMUSCULAR | Status: DC | PRN
  Administered 2017-03-29: 30 mg via INTRAVENOUS

## 2017-03-29 MED ORDER — LOSARTAN POTASSIUM 50 MG PO TABS
50.0000 mg | ORAL_TABLET | Freq: Every day | ORAL | Status: DC
  Administered 2017-03-29 – 2017-04-01 (×4): 50 mg via ORAL
  Filled 2017-03-29 (×5): qty 1

## 2017-03-29 MED ORDER — SODIUM CHLORIDE 0.9 % IV SOLN
3.0000 g | Freq: Four times a day (QID) | INTRAVENOUS | Status: DC
  Administered 2017-03-29 – 2017-04-01 (×11): 3 g via INTRAVENOUS
  Filled 2017-03-29 (×14): qty 3

## 2017-03-29 MED ORDER — ACETAMINOPHEN 10 MG/ML IV SOLN
INTRAVENOUS | Status: DC | PRN
  Administered 2017-03-29: 1000 mg via INTRAVENOUS

## 2017-03-29 MED ORDER — LACTATED RINGERS IV SOLN
INTRAVENOUS | Status: DC
  Administered 2017-03-29 – 2017-03-31 (×6): via INTRAVENOUS

## 2017-03-29 MED ORDER — HYDROMORPHONE HCL 1 MG/ML IJ SOLN
1.0000 mg | Freq: Once | INTRAMUSCULAR | Status: AC
  Administered 2017-03-29: 1 mg via INTRAVENOUS
  Filled 2017-03-29: qty 1

## 2017-03-29 MED ORDER — PHENYTOIN SODIUM EXTENDED 100 MG PO CAPS
300.0000 mg | ORAL_CAPSULE | Freq: Every day | ORAL | Status: DC
  Administered 2017-03-29 – 2017-03-31 (×3): 300 mg via ORAL
  Filled 2017-03-29 (×4): qty 3

## 2017-03-29 MED ORDER — ONDANSETRON HCL 4 MG/2ML IJ SOLN
4.0000 mg | Freq: Four times a day (QID) | INTRAMUSCULAR | Status: DC | PRN
  Administered 2017-03-29: 4 mg via INTRAVENOUS
  Filled 2017-03-29: qty 2

## 2017-03-29 MED ORDER — SODIUM CHLORIDE 0.9 % IV BOLUS (SEPSIS)
1000.0000 mL | Freq: Once | INTRAVENOUS | Status: AC
  Administered 2017-03-29: 1000 mL via INTRAVENOUS

## 2017-03-29 MED ORDER — ROCURONIUM BROMIDE 50 MG/5ML IV SOLN
INTRAVENOUS | Status: AC
  Filled 2017-03-29: qty 1

## 2017-03-29 MED ORDER — FENTANYL CITRATE (PF) 100 MCG/2ML IJ SOLN: 25.0000 ug | INTRAMUSCULAR | Status: DC | PRN

## 2017-03-29 MED ORDER — ROCURONIUM BROMIDE 100 MG/10ML IV SOLN
INTRAVENOUS | Status: DC | PRN
  Administered 2017-03-29: 10 mg via INTRAVENOUS
  Administered 2017-03-29: 40 mg via INTRAVENOUS
  Administered 2017-03-29: 20 mg via INTRAVENOUS

## 2017-03-29 MED ORDER — CEFAZOLIN SODIUM 1 G IJ SOLR
INTRAMUSCULAR | Status: AC
  Filled 2017-03-29: qty 20

## 2017-03-29 MED ORDER — BUPIVACAINE-EPINEPHRINE (PF) 0.25% -1:200000 IJ SOLN
INTRAMUSCULAR | Status: DC | PRN
  Administered 2017-03-29: 30 mL

## 2017-03-29 MED ORDER — FENTANYL CITRATE (PF) 100 MCG/2ML IJ SOLN
INTRAMUSCULAR | Status: DC | PRN
  Administered 2017-03-29: 100 ug via INTRAVENOUS
  Administered 2017-03-29 (×4): 50 ug via INTRAVENOUS

## 2017-03-29 MED ORDER — PROPOFOL 10 MG/ML IV BOLUS
INTRAVENOUS | Status: AC
  Filled 2017-03-29: qty 20

## 2017-03-29 MED ORDER — PHENYTOIN SODIUM EXTENDED 100 MG PO CAPS
400.0000 mg | ORAL_CAPSULE | Freq: Every day | ORAL | Status: DC
  Filled 2017-03-29: qty 4

## 2017-03-29 MED ORDER — CEFAZOLIN SODIUM-DEXTROSE 2-3 GM-%(50ML) IV SOLR
INTRAVENOUS | Status: DC | PRN
  Administered 2017-03-29: 2 g via INTRAVENOUS

## 2017-03-29 MED ORDER — PHENYLEPHRINE HCL 10 MG/ML IJ SOLN
INTRAMUSCULAR | Status: DC | PRN
  Administered 2017-03-29: 100 ug via INTRAVENOUS

## 2017-03-29 MED ORDER — PNEUMOCOCCAL VAC POLYVALENT 25 MCG/0.5ML IJ INJ
0.5000 mL | INJECTION | INTRAMUSCULAR | Status: DC
  Filled 2017-03-29: qty 0.5

## 2017-03-29 MED ORDER — ONDANSETRON HCL 4 MG/2ML IJ SOLN
4.0000 mg | Freq: Once | INTRAMUSCULAR | Status: AC
  Administered 2017-03-29: 4 mg via INTRAVENOUS
  Filled 2017-03-29: qty 2

## 2017-03-29 MED ORDER — MIDAZOLAM HCL 2 MG/2ML IJ SOLN
INTRAMUSCULAR | Status: AC
  Filled 2017-03-29: qty 2

## 2017-03-29 MED ORDER — LIDOCAINE HCL (CARDIAC) 20 MG/ML IV SOLN
INTRAVENOUS | Status: DC | PRN
  Administered 2017-03-29: 100 mg via INTRAVENOUS

## 2017-03-29 MED ORDER — PHENYTOIN SODIUM EXTENDED 100 MG PO CAPS
200.0000 mg | ORAL_CAPSULE | Freq: Every morning | ORAL | Status: DC
  Administered 2017-03-30 – 2017-04-01 (×3): 200 mg via ORAL
  Filled 2017-03-29 (×3): qty 2

## 2017-03-29 SURGICAL SUPPLY — 42 items
ADHESIVE MASTISOL STRL (MISCELLANEOUS) ×2 IMPLANT
APPLIER CLIP ROT 10 11.4 M/L (STAPLE) ×2
BLADE SURG SZ11 CARB STEEL (BLADE) ×2 IMPLANT
CANISTER SUCT 1200ML W/VALVE (MISCELLANEOUS) ×2 IMPLANT
CATH CHOLANGI 4FR 420404F (CATHETERS) IMPLANT
CHLORAPREP W/TINT 26ML (MISCELLANEOUS) ×2 IMPLANT
CLIP APPLIE ROT 10 11.4 M/L (STAPLE) ×1 IMPLANT
CONRAY 60ML FOR OR (MISCELLANEOUS) IMPLANT
DRAPE C-ARM XRAY 36X54 (DRAPES) IMPLANT
ELECT REM PT RETURN 9FT ADLT (ELECTROSURGICAL) ×2
ELECTRODE REM PT RTRN 9FT ADLT (ELECTROSURGICAL) ×1 IMPLANT
GAUZE SPONGE NON-WVN 2X2 STRL (MISCELLANEOUS) ×4 IMPLANT
GLOVE BIO SURGEON STRL SZ8 (GLOVE) ×2 IMPLANT
GOWN STRL REUS W/ TWL LRG LVL3 (GOWN DISPOSABLE) ×4 IMPLANT
GOWN STRL REUS W/TWL LRG LVL3 (GOWN DISPOSABLE) ×4
IRRIGATION STRYKERFLOW (MISCELLANEOUS) ×1 IMPLANT
IRRIGATOR STRYKERFLOW (MISCELLANEOUS) ×2
IV CATH ANGIO 12GX3 LT BLUE (NEEDLE) IMPLANT
IV NS 1000ML (IV SOLUTION) ×1
IV NS 1000ML BAXH (IV SOLUTION) ×1 IMPLANT
JACKSON PRATT 10 (INSTRUMENTS) ×2 IMPLANT
KIT RM TURNOVER STRD PROC AR (KITS) ×2 IMPLANT
LABEL OR SOLS (LABEL) ×2 IMPLANT
NEEDLE HYPO 22GX1.5 SAFETY (NEEDLE) ×2 IMPLANT
NEEDLE VERESS 14GA 120MM (NEEDLE) ×2 IMPLANT
NS IRRIG 500ML POUR BTL (IV SOLUTION) ×2 IMPLANT
PACK LAP CHOLECYSTECTOMY (MISCELLANEOUS) ×2 IMPLANT
POUCH SPECIMEN RETRIEVAL 10MM (ENDOMECHANICALS) ×2 IMPLANT
SCISSORS METZENBAUM CVD 33 (INSTRUMENTS) ×2 IMPLANT
SLEEVE ENDOPATH XCEL 5M (ENDOMECHANICALS) ×4 IMPLANT
SPONGE LAP 18X18 5 PK (GAUZE/BANDAGES/DRESSINGS) ×2 IMPLANT
SPONGE VERSALON 2X2 STRL (MISCELLANEOUS) ×4
SPONGE VERSALON 4X4 4PLY (MISCELLANEOUS) IMPLANT
STRIP CLOSURE SKIN 1/2X4 (GAUZE/BANDAGES/DRESSINGS) ×2 IMPLANT
SUT MNCRL 4-0 (SUTURE) ×1
SUT MNCRL 4-0 27XMFL (SUTURE) ×1
SUT VICRYL 0 AB UR-6 (SUTURE) ×2 IMPLANT
SUTURE MNCRL 4-0 27XMF (SUTURE) ×1 IMPLANT
SYR 20CC LL (SYRINGE) ×2 IMPLANT
TROCAR XCEL NON-BLD 11X100MML (ENDOMECHANICALS) ×2 IMPLANT
TROCAR XCEL NON-BLD 5MMX100MML (ENDOMECHANICALS) ×2 IMPLANT
TUBING INSUFFLATION (TUBING) ×2 IMPLANT

## 2017-03-29 NOTE — OR Nursing (Signed)
Spoke with Dr Rosey Bath about patients potassium being 3.1. Ok to proceed with surgery with low potassium per Dr Rosey Bath.

## 2017-03-29 NOTE — Anesthesia Post-op Follow-up Note (Signed)
Anesthesia QCDR form completed.        

## 2017-03-29 NOTE — ED Notes (Signed)
Pt transferred to OR by orderly at this time, pt in NAD, VS stable

## 2017-03-29 NOTE — Transfer of Care (Signed)
Immediate Anesthesia Transfer of Care Note  Patient: Thomas Cole  Procedure(s) Performed: LAPAROSCOPIC CHOLECYSTECTOMY (N/A Abdomen)  Patient Location: PACU  Anesthesia Type:General  Level of Consciousness: sedated  Airway & Oxygen Therapy: Patient Spontanous Breathing and Patient connected to face mask oxygen  Post-op Assessment: Report given to RN and Post -op Vital signs reviewed and stable  Post vital signs: Reviewed and stable  Last Vitals:  Vitals:   03/29/17 1400 03/29/17 1433  BP: (!) 148/92 (!) 129/93  Pulse: 99 99  Resp: 14 17  Temp:  (!) 38.4 C  SpO2: 95% 96%    Last Pain:  Vitals:   03/29/17 1433  TempSrc: Tympanic  PainSc: 10-Worst pain ever         Complications: No apparent anesthesia complications

## 2017-03-29 NOTE — Anesthesia Postprocedure Evaluation (Signed)
Anesthesia Post Note  Patient: Lelon Mast  Procedure(s) Performed: LAPAROSCOPIC CHOLECYSTECTOMY (N/A Abdomen)  Patient location during evaluation: PACU Anesthesia Type: General Level of consciousness: awake and alert Pain management: pain level controlled Vital Signs Assessment: post-procedure vital signs reviewed and stable Respiratory status: spontaneous breathing, nonlabored ventilation, respiratory function stable and patient connected to nasal cannula oxygen Cardiovascular status: blood pressure returned to baseline and stable Postop Assessment: no apparent nausea or vomiting Anesthetic complications: no     Last Vitals:  Vitals:   03/29/17 1717 03/29/17 1732  BP: (!) 128/97 116/85  Pulse: 97 96  Resp: 10 12  Temp:    SpO2: 100% 97%    Last Pain:  Vitals:   03/29/17 1717  TempSrc:   PainSc: 0-No pain                 Precious Haws Elroy Schembri

## 2017-03-29 NOTE — ED Triage Notes (Signed)
Pt to ED via POV, pt wife states that pt was seen in our ED on Wednesday morning for constipation. Pt has not had BM since Tuesday Morning, has tried Miralax, stool softeners, and enema at home without relief. Pt has been nauseated but has not vomited. Pt states that his abdomen feels swollen and is tender. Pt has not been running fever but is clammy on assessment. Pt appears uncomfortable at this time.

## 2017-03-29 NOTE — Anesthesia Procedure Notes (Signed)
Procedure Name: Intubation Date/Time: 03/29/2017 3:37 PM Performed by: Nelda Marseille, CRNA Pre-anesthesia Checklist: Patient identified, Patient being monitored, Timeout performed, Emergency Drugs available and Suction available Patient Re-evaluated:Patient Re-evaluated prior to induction Oxygen Delivery Method: Circle system utilized Preoxygenation: Pre-oxygenation with 100% oxygen Induction Type: IV induction Ventilation: Mask ventilation without difficulty Laryngoscope Size: Mac and 3 Grade View: Grade III Tube type: Oral Tube size: 7.5 mm Number of attempts: 1 Airway Equipment and Method: Stylet Placement Confirmation: ETT inserted through vocal cords under direct vision,  positive ETCO2 and breath sounds checked- equal and bilateral Secured at: 21 cm Tube secured with: Tape Dental Injury: Teeth and Oropharynx as per pre-operative assessment  Difficulty Due To: Difficulty was unanticipated

## 2017-03-29 NOTE — ED Provider Notes (Signed)
[facility] Emergency Department Provider Note  ____________________________________________   I have reviewed the triage vital signs and the nursing notes. Where available I have reviewed prior notes and, if possible and indicated, outside hospital notes.    HISTORY  Chief Complaint Abdominal Pain    HPI Thomas Cole is a 46 y.o. male who has had appendicitis but no other abdominal surgeries, that was 6 years ago, has had multiple different negative CT scans over the last decade, but none recently until 2 days ago.  He states since Tuesday, today is Friday, he has been having diffuse abdominal pain which now seems to be in the epigastric and right upper quadrant pain more than anywhere else.  Decreased stooling, nausea but no vomiting, no fever, no other complaints.  He was seen here had a CT scan which showed a significant stool burden, he states he has had a small bowel movement but nothing significant despite oral and PR medications for this.  However, the pain seems to be as bad as it was several days ago if not worse and he is here for that reason.  He denies any dysuria urinary frequency, he denies any melena or bright red blood per rectum he is able to take his medications including his Dilantin he has not had a seizure.  He has not had any melena or bright red blood per rectum.  No hematochezia.  no hematemesis   Location: Entire abdomen is uncomfortable but mostly epigastric and right upper quadrant Radiation: None Quality: Sharp discomfort Duration: 3 days Timing: He cannot think of any alleviating or aggravating factors Severity: Significant Associated sxs: Nausea and decreased p.o. intake of solids and liquids PriorTreatment : See above   Past Medical History:  Diagnosis Date  . Anxiety   . Asthma   . Back problem   . Complication of anesthesia    occ takes longer to wake  . Depression   . GERD (gastroesophageal reflux disease)   .  Hypertension   . Migraine headache   . Seizures ([place])    last 87  . Shortness of breath     Patient Active Problem List   Diagnosis Date Noted  . Localization-related symptomatic epilepsy and epileptic syndromes with complex partial seizures, not intractable, without status epilepticus ([place]) 02/21/2016  . Generalized anxiety disorder 06/23/2015  . Major depressive disorder, recurrent episode, severe ([place]) 06/23/2015  . PTSD (post-traumatic stress disorder) 06/16/2015  . SOB (shortness of breath) 08/06/2014  . Obesity (BMI 30-39.9) 11/11/2013  . Migraines 11/11/2013  . Asthma, mild intermittent 11/11/2013  . History of seizures 11/11/2013  . GERD (gastroesophageal reflux disease) 11/11/2013  . Depression 11/11/2013  . Labile hypertension 03/19/2013  . Cervical spondylosis without myelopathy 09/17/2012  . Sleep apnea 05/25/2010    Past Surgical History:  Procedure Laterality Date  . ANTERIOR CERVICAL CORPECTOMY N/A 09/15/2012   Procedure: Cervical Three-Four,Cervical Six-Seven Anterior cervical decompression/diskectomy fusion with Cervical Four Corpectomy;  Surgeon: Kristeen Miss, MD;  Location: [place] [facility];  Service: Neurosurgery;  Laterality: N/A;  Cervical Three-Four,Cervical Six-Seven  Anterior cervical decompression/diskectomy fusion with Cervical four Corpectomy  . APPENDECTOMY  12  . CERVICAL DISC SURGERY    . KNEE SURGERY     bilateral  . POSTERIOR FUSION CERVICAL SPINE      Prior to Admission medications   Medication Sig Start Date End Date Taking? Authorizing Provider  albuterol (PROVENTIL HFA;VENTOLIN HFA) 108 (90 BASE) MCG/ACT inhaler Inhale 2 puffs into the lungs every  6 (six) hours as needed for wheezing or shortness of breath. Patient not taking: Reported on 03/27/2017 11/11/13   Jearld Fenton, NP  ALPRAZolam Duanne Moron) 0.5 MG tablet TAKE 1 TABLET BY MOUTH EVERY DAY AT BEDTIME AS NEEDED FOR ANXIETY Patient not taking: Reported on 03/27/2017 10/26/16   Coral Spikes,  DO  cyclobenzaprine (FLEXERIL) 10 MG tablet TAKE 1 TABLET (10 MG TOTAL) BY MOUTH 3 (THREE) TIMES DAILY AS NEEDED FOR MUSCLE SPASMS. 10/26/16   Coral Spikes, DO  docusate sodium (COLACE) 100 MG capsule Take 1 capsule (100 mg total) by mouth 2 (two) times daily. 03/27/17 04/26/17  Harvest Dark, MD  furosemide (LASIX) 20 MG tablet TAKE 1 TABLET (20 MG TOTAL) BY MOUTH DAILY. Patient not taking: Reported on 03/27/2017 10/01/14   Rubbie Battiest, RN  losartan-hydrochlorothiazide (HYZAAR) 50-12.5 MG tablet Take 1 tablet by mouth daily.  11/30/16 11/30/17  [provider]  omeprazole (PRILOSEC) 20 MG capsule Take x 2 x 30 m before bfast 09/16/14   Tanda Rockers, MD  phenytoin (DILANTIN) 100 MG ER capsule TAKE 2 CAPSULES BY MOUTH EVERY MORNING AND TAKE 3 CAPSULES BY MOUTH EVERY EVENING 02/25/17   Cameron Sprang, MD  polyethylene glycol powder (GLYCOLAX/MIRALAX) powder Take 17 g by mouth 2 (two) times daily. Please take 17 g twice daily until you begin having multiple bowel movements then you may discontinue use of MiraLAX. 03/27/17   Harvest Dark, MD  traMADol (ULTRAM) 50 MG tablet Take 1 tablet (50 mg total) by mouth every 8 (eight) hours as needed. Patient not taking: Reported on 03/27/2017 09/02/14   Rubbie Battiest, RN    Allergies Amitriptyline; Bee venom; Chlorhexidine; Carbamazepine; Meloxicam; and Sulfa antibiotics  Family History  Problem Relation Age of Onset  . Coronary artery disease Mother   . Hypertension Mother   . Mental illness Mother   . Arthritis Mother   . Asthma Mother   . Coronary artery disease Father   . Heart disease Father   . Arthritis Father   . Arthritis Maternal Grandmother   . Heart disease Maternal Grandmother   . Hypertension Maternal Grandmother   . Arthritis Maternal Grandfather   . Heart disease Maternal Grandfather   . Hypertension Maternal Grandfather   . Arthritis Paternal Grandmother   . Heart disease Paternal Grandmother   . Hypertension  Paternal Grandmother   . Arthritis Paternal Grandfather   . Heart disease Paternal Grandfather   . Hypertension Paternal Grandfather   . Cancer Neg Hx   . Diabetes Neg Hx   . Stroke Neg Hx     Social History Social History   Tobacco Use  . Smoking status: Never Smoker  . Smokeless tobacco: Never Used  Substance Use Topics  . Alcohol use: No    Alcohol/week: 0.0 oz  . Drug use: No    Review of Systems Constitutional: No fever/chills Eyes: No visual changes. ENT: No sore throat. No stiff neck no neck pain Cardiovascular: Denies chest pain. Respiratory: Denies shortness of breath. Gastrointestinal: Above Genitourinary: Negative for dysuria. Musculoskeletal: Negative lower extremity swelling Skin: Negative for rash. Neurological: Negative for severe headaches, focal weakness or numbness.   ____________________________________________   PHYSICAL EXAM:  VITAL SIGNS: ED Triage Vitals  Enc Vitals Group     BP 03/29/17 0910 (!) 124/94     Pulse Rate 03/29/17 0910 (!) 103     Resp 03/29/17 0910 16     Temp 03/29/17 0910 99.3 F (37.4 C)  Temp Source 03/29/17 0910 Oral     SpO2 03/29/17 0910 97 %     Weight 03/29/17 0912 267 lb (121.1 kg)     Height 03/29/17 0912 6\' 2"  (1.88 m)     Head Circumference --      Peak Flow --      Pain Score 03/29/17 0910 10     Pain Loc --      Pain Edu? --      Excl. in [place]? --     Constitutional: Alert and oriented. Well appearing and in no acute distress. Eyes: Conjunctivae are normal Head: Atraumatic HEENT: No congestion/rhinnorhea. Mucous membranes are moist.  Oropharynx non-erythematous Neck:   Nontender with no meningismus, no masses, no stridor Cardiovascular: Normal rate, regular rhythm. Grossly normal heart sounds.  Good peripheral circulation. Respiratory: Normal respiratory effort.  No retractions. Lungs CTAB. Abdominal: Soft and tenderness noted but most principally tenderness is identified in the epigastric and right  upper quadrant region, and I touch this area patient has voluntary guarding but no rebound abdomen is soft and obese. No distention.  Back:  There is no focal tenderness or step off.  there is no midline tenderness there are no lesions noted. there is no CVA tenderness Musculoskeletal: No lower extremity tenderness, no upper extremity tenderness. No joint effusions, no DVT signs strong distal pulses no edema Neurologic:  Normal speech and language. No gross focal neurologic deficits are appreciated.  Skin:  Skin is warm, dry and intact. No rash noted. Psychiatric: Mood and affect are normal. Speech and behavior are normal.  ____________________________________________   LABS (all labs ordered are listed, but only abnormal results are displayed)  Labs Reviewed  COMPREHENSIVE METABOLIC PANEL - Abnormal; Notable for the following components:      Result Value   Potassium 3.1 (*)    CO2 21 (*)    Glucose, Bld 102 (*)    Total Bilirubin 1.5 (*)    All other components within normal limits  CBC - Abnormal; Notable for the following components:   WBC 14.3 (*)    All other components within normal limits  URINALYSIS, COMPLETE (UACMP) WITH MICROSCOPIC - Abnormal; Notable for the following components:   Color, Urine YELLOW (*)    APPearance CLEAR (*)    Ketones, ur 20 (*)    Protein, ur 30 (*)    All other components within normal limits  LIPASE, BLOOD  PHENYTOIN LEVEL, TOTAL    Pertinent labs  results that were available during my care of the patient were reviewed by me and considered in my medical decision making (see chart for details). ____________________________________________  EKG  I personally interpreted any EKGs ordered by me or triage Sinus tach rate 109, no acute ST elevation or depression nonspecific ST changes, ____________________________________________  RADIOLOGY  Pertinent labs & imaging results that were available during my care of the patient were reviewed by me  and considered in my medical decision making (see chart for details). If possible, patient and/or family made aware of any abnormal findings.  No results found. ____________________________________________    PROCEDURES  Procedure(s) performed: None  Procedures  Critical Care performed: None  ____________________________________________   INITIAL IMPRESSION / ASSESSMENT AND PLAN / ED COURSE  Pertinent labs & imaging results that were available during my care of the patient were reviewed by me and considered in my medical decision making (see chart for details).  CT scan did show some gallbladder wall thickening which is of concern at  this time he is right upper quadrant pain and his bilirubin is starting to go up.  I did do a ultrasound ultrasound shows gallbladder wall thickening and sludge, discussed with surgery they will evaluate and admit the patient. This is not appear to be referred chest pain   ____________________________________________   FINAL CLINICAL IMPRESSION(S) / ED DIAGNOSES  Final diagnoses:  Abdominal pain      This chart was dictated using voice recognition software.  Despite best efforts to proofread,  errors can occur which can change meaning.      Schuyler Amor, MD 03/29/17 [phone]

## 2017-03-29 NOTE — H&P (Signed)
Thomas Cole is an 46 y.o. male.    Chief Complaint: Right upper quadrant pain  HPI: This patient was in the emergency room 2 days ago with the identical symptoms today which has been unrelenting right upper quadrant and epigastric abdominal pain.  He is never had an episode like this before but in retrospect thinks he may have had some mild right upper quadrant pain in the past may be back in the spring.  It was self-limiting and nothing like the severity that he is noticing now.  He has had nausea but no emesis he wants to vomit but cannot he is not hungry.  He has not had any bowel movement today but is passing gas.  He has had a prior appendectomy.  He points to the right upper quadrant as the source of his pain.  Past Medical History:  Diagnosis Date  . Anxiety   . Asthma   . Back problem   . Complication of anesthesia    occ takes longer to wake  . Depression   . GERD (gastroesophageal reflux disease)   . Hypertension   . Migraine headache   . Seizures ([place])    last 86  . Shortness of breath     Past Surgical History:  Procedure Laterality Date  . ANTERIOR CERVICAL CORPECTOMY N/A 09/15/2012   Procedure: Cervical Three-Four,Cervical Six-Seven Anterior cervical decompression/diskectomy fusion with Cervical Four Corpectomy;  Surgeon: Kristeen Miss, MD;  Location: [facility];  Service: Neurosurgery;  Laterality: N/A;  Cervical Three-Four,Cervical Six-Seven  Anterior cervical decompression/diskectomy fusion with Cervical four Corpectomy  . APPENDECTOMY  12  . CERVICAL DISC SURGERY    . KNEE SURGERY     bilateral  . POSTERIOR FUSION CERVICAL SPINE      Family History  Problem Relation Age of Onset  . Coronary artery disease Mother   . Hypertension Mother   . Mental illness Mother   . Arthritis Mother   . Asthma Mother   . Coronary artery disease Father   . Heart disease Father   . Arthritis Father   . Arthritis Maternal Grandmother   . Heart disease Maternal Grandmother    . Hypertension Maternal Grandmother   . Arthritis Maternal Grandfather   . Heart disease Maternal Grandfather   . Hypertension Maternal Grandfather   . Arthritis Paternal Grandmother   . Heart disease Paternal Grandmother   . Hypertension Paternal Grandmother   . Arthritis Paternal Grandfather   . Heart disease Paternal Grandfather   . Hypertension Paternal Grandfather   . Cancer Neg Hx   . Diabetes Neg Hx   . Stroke Neg Hx    Social History:  reports that  has never smoked. he has never used smokeless tobacco. He reports that he does not drink alcohol or use drugs.  Allergies:  Allergies  Allergen Reactions  . Amitriptyline Other (See Comments)    Felt really bad- psychologically  . Bee Venom Itching and Swelling  . Chlorhexidine Itching    REACTION TO WIPES/ CLOTHES ONLY==he can tolerate the SCRUB LIQUID  . Carbamazepine Itching and Rash  . Meloxicam Itching and Rash  . Sulfa Antibiotics Itching and Rash     (Not in a hospital admission)   Review of Systems  Constitutional: Negative for chills and fever.  HENT: Negative.   Eyes: Negative.   Respiratory: Negative.   Cardiovascular: Negative.   Gastrointestinal: Positive for abdominal pain, heartburn and nausea. Negative for blood in stool, constipation, diarrhea, melena and vomiting.  Genitourinary: Negative.   Musculoskeletal: Negative.   Skin: Negative.   Neurological: Negative.   Endo/Heme/Allergies: Negative.   Psychiatric/Behavioral: Negative.      Physical Exam:  BP (!) 143/97   Pulse 94   Temp 99.3 F (37.4 C) (Oral)   Resp 18   Ht 6' 2"  (1.88 m)   Wt 267 lb (121.1 kg)   SpO2 92%   BMI 34.28 kg/m   Physical Exam  Constitutional: He is oriented to person, place, and time and well-developed, well-nourished, and in no distress. No distress.  HENT:  Head: Normocephalic and atraumatic.  Eyes: Pupils are equal, round, and reactive to light. Right eye exhibits no discharge. Left eye exhibits no  discharge. No scleral icterus.  Neck: Normal range of motion. No JVD present.  Cardiovascular: Normal rate, regular rhythm and normal heart sounds.  Pulmonary/Chest: Effort normal and breath sounds normal. No respiratory distress. He has no wheezes. He has no rales.  Abdominal: Soft. He exhibits no distension. There is tenderness. There is guarding. There is no rebound.  Positive Murphy sign maximal tenderness in right upper quadrant  Musculoskeletal: He exhibits no edema or tenderness.  Lymphadenopathy:    He has no cervical adenopathy.  Neurological: He is alert and oriented to person, place, and time.  Skin: Skin is warm and dry. No rash noted. He is not diaphoretic. No erythema.  Psychiatric: Mood and affect normal.  Vitals reviewed.       Results for orders placed or performed during the hospital encounter of 03/29/17 (from the past 48 hour(s))  Lipase, blood     Status: None   Collection Time: 03/29/17  9:14 AM  Result Value Ref Range   Lipase 29 11 - 51 U/L    Comment: Performed at [facility], [address] [place], [place] [address]  Comprehensive metabolic panel     Status: Abnormal   Collection Time: 03/29/17  9:14 AM  Result Value Ref Range   Sodium 135 135 - 145 mmol/L   Potassium 3.1 (L) 3.5 - 5.1 mmol/L   Chloride 102 101 - 111 mmol/L   CO2 21 (L) 22 - 32 mmol/L   Glucose, Bld 102 (H) 65 - 99 mg/dL   BUN 17 6 - 20 mg/dL   Creatinine, Ser 0.94 0.61 - 1.24 mg/dL   Calcium 9.2 8.9 - 10.3 mg/dL   Total Protein 7.8 6.5 - 8.1 g/dL   Albumin 4.3 3.5 - 5.0 g/dL   AST 30 15 - 41 U/L   ALT 26 17 - 63 U/L   Alkaline Phosphatase 86 38 - 126 U/L   Total Bilirubin 1.5 (H) 0.3 - 1.2 mg/dL   GFR calc non Af Amer >60 >60 mL/min   GFR calc Af Amer >60 >60 mL/min    Comment: (NOTE) The eGFR has been calculated using the CKD EPI equation. This calculation has not been validated in all clinical situations. eGFR's persistently <60 mL/min signify possible Chronic  Kidney Disease.    Anion gap 12 5 - 15    Comment: Performed at [facility], [address] [place], [place] [address]  CBC     Status: Abnormal   Collection Time: 03/29/17  9:14 AM  Result Value Ref Range   WBC 14.3 (H) 3.8 - 10.6 K/uL   RBC 5.23 4.40 - 5.90 MIL/uL   Hemoglobin 15.9 13.0 - 18.0 g/dL   HCT 46.2 40.0 - 52.0 %   MCV 88.3 80.0 - 100.0 fL  MCH 30.4 26.0 - 34.0 pg   MCHC 34.5 32.0 - 36.0 g/dL   RDW 13.3 11.5 - 14.5 %   Platelets 209 150 - 440 K/uL    Comment: Performed at [facility], [address] [place], [place] [address]  Urinalysis, Complete w Microscopic     Status: Abnormal   Collection Time: 03/29/17  9:14 AM  Result Value Ref Range   Color, Urine YELLOW (A) YELLOW   APPearance CLEAR (A) CLEAR   Specific Gravity, Urine 1.018 1.005 - 1.030   pH 6.0 5.0 - 8.0   Glucose, UA NEGATIVE NEGATIVE mg/dL   Hgb urine dipstick NEGATIVE NEGATIVE   Bilirubin Urine NEGATIVE NEGATIVE   Ketones, ur 20 (A) NEGATIVE mg/dL   Protein, ur 30 (A) NEGATIVE mg/dL   Nitrite NEGATIVE NEGATIVE   Leukocytes, UA NEGATIVE NEGATIVE   RBC / HPF 0-5 0 - 5 RBC/hpf   WBC, UA 0-5 0 - 5 WBC/hpf   Bacteria, UA NONE SEEN NONE SEEN   Squamous Epithelial / LPF NONE SEEN NONE SEEN   Mucus PRESENT     Comment: Performed at [facility], [address] [place], [place] [address]   US Abdomen Limited Ruq  Result Date: 03/29/2017 CLINICAL DATA:  Diffuse abdominal pain for 3 days. EXAM: ULTRASOUND ABDOMEN LIMITED RIGHT UPPER QUADRANT COMPARISON:  CT abdomen and pelvis on 12/26 FINDINGS: Gallbladder: Gallbladder wall is thickened, measuring 7 mm. There is gallbladder sludge but no stones or pericholecystic fluid. No sonographic Murphy's sign. Common bile duct: Diameter: 5 mm Liver: No focal lesion identified. Within normal limits in parenchymal echogenicity. Portal vein is patent on color Doppler imaging with normal direction of blood flow towards the liver.  IMPRESSION: 1. Thickened gallbladder wall and gallbladder sludge. 2. No other features of acute cholecystitis. Electronically Signed   By: Nolon Nations M.D.   On: 03/29/2017 11:11     Assessment/Plan  This patient with normal liver function test but right upper quadrant pain and a workup suggesting sludge in his gallbladder with thickened gallbladder wall.  He has a positive Murphy sign is quite tender.  He has been unable to eat.  With that in mind I am recommending laparoscopic cholecystectomy for control of his symptoms with a diagnosis of likely acute cholecystitis.  The rationale for this was discussed the options of observation with IV antibiotics was reviewed with he and his wife.  The risks of bleeding infection inability to proceed with the laparoscope bile duct or bowel injury necessitating open procedure and repair were all reviewed with him he understood and agreed to proceed  Florene Glen, MD, FACS

## 2017-03-29 NOTE — ED Notes (Signed)
Patient heart rate noted to increase to 147 bmp when patient stood up to urinate. Pt denies dizziness but states that he feels very weak when standing. Once patient returned to bed, heart rate decreased to 104-105 bmp.  Dr. Burlene Arnt made aware. Will continue to monitor patient.

## 2017-03-29 NOTE — ED Notes (Signed)
Pt refused morphine, states dilaudid works better. MD Burt Knack attempted at this time.

## 2017-03-29 NOTE — Anesthesia Preprocedure Evaluation (Signed)
Anesthesia Evaluation  Patient identified by MRN, date of birth, ID band Patient awake    Reviewed: Allergy & Precautions, H&P , NPO status , Patient's Chart, lab work & pertinent test results, reviewed documented beta blocker date and time   History of Anesthesia Complications (+) PROLONGED EMERGENCE and history of anesthetic complications  Airway Mallampati: III  TM Distance: >3 FB Neck ROM: limited    Dental  (+) Dental Advidsory Given   Pulmonary neg shortness of breath, asthma , sleep apnea , Recent URI , Resolved,    Pulmonary exam normal        Cardiovascular Exercise Tolerance: Good hypertension, (-) angina(-) CAD, (-) Past MI, (-) Cardiac Stents and (-) CABG (-) dysrhythmias (-) Valvular Problems/Murmurs     Neuro/Psych Seizures -, Well Controlled,  PSYCHIATRIC DISORDERS    GI/Hepatic Neg liver ROS, GERD  ,  Endo/Other  negative endocrine ROS  Renal/GU negative Renal ROS  negative genitourinary   Musculoskeletal   Abdominal   Peds  Hematology negative hematology ROS (+)   Anesthesia Other Findings Past Medical History: No date: Anxiety No date: Asthma No date: Back problem No date: Complication of anesthesia     Comment:  occ takes longer to wake No date: Depression No date: GERD (gastroesophageal reflux disease) No date: Hypertension No date: Migraine headache No date: Seizures ([place])     Comment:  last 73 No date: Shortness of breath   Reproductive/Obstetrics negative OB ROS                             Anesthesia Physical Anesthesia Plan  ASA: III  Anesthesia Plan: General   Post-op Pain Management:    Induction: Intravenous, Rapid sequence and Cricoid pressure planned  PONV Risk Score and Plan: 2 and Ondansetron and Dexamethasone  Airway Management Planned: Oral ETT  Additional Equipment:   Intra-op Plan:   Post-operative Plan: Extubation in OR  Informed  Consent: I have reviewed the patients History and Physical, chart, labs and discussed the procedure including the risks, benefits and alternatives for the proposed anesthesia with the patient or authorized representative who has indicated his/her understanding and acceptance.   Dental Advisory Given  Plan Discussed with: Anesthesiologist, CRNA and Surgeon  Anesthesia Plan Comments:         Anesthesia Quick Evaluation

## 2017-03-29 NOTE — Progress Notes (Signed)
Preoperative Review   Patient is met in the preoperative holding area. The history is reviewed in the chart and with the patient. I personally reviewed the options and rationale as well as the risks of this procedure that have been previously discussed with the patient. All questions asked by the patient and/or family were answered to their satisfaction.  Patient agrees to proceed with this procedure at this time.  Evyn Putzier E Koreen Lizaola M.D. FACS  

## 2017-03-29 NOTE — ED Triage Notes (Signed)
First Nurse Note:  Pt to ed with c/o abd pain, last BM on Tuesday. Seen here on Wednesday for same.

## 2017-03-29 NOTE — ED Notes (Signed)
Patient transported to Ultrasound 

## 2017-03-29 NOTE — Op Note (Signed)
Laparoscopic Cholecystectomy  Pre-operative Diagnosis: Acute cholecystitis  Post-operative Diagnosis: Acute gangrenous cholecystitis  Procedure: Laparoscopic cholecystectomy  Surgeon: Jerrol Banana. Burt Knack, MD FACS  Anesthesia: Gen. with endotracheal tube  Assistant: Surgical tech  Procedure Details  The patient was seen again in the Holding Room. The benefits, complications, treatment options, and expected outcomes were discussed with the patient. The risks of bleeding, infection, recurrence of symptoms, failure to resolve symptoms, bile duct damage, bile duct leak, retained common bile duct stone, bowel injury, any of which could require further surgery and/or ERCP, stent, or papillotomy were reviewed with the patient. The likelihood of improving the patient's symptoms with return to their baseline status is good.  The patient and/or family concurred with the proposed plan, giving informed consent.  The patient was taken to Operating Room, identified as Thomas Cole and the procedure verified as Laparoscopic Cholecystectomy.  A Time Out was held and the above information confirmed.  Prior to the induction of general anesthesia, antibiotic prophylaxis was administered. VTE prophylaxis was in place. General endotracheal anesthesia was then administered and tolerated well. After the induction, the abdomen was prepped with Chloraprep and draped in the sterile fashion. The patient was positioned in the supine position.  Local anesthetic  was injected into the skin near the umbilicus and an incision made. The Veress needle was placed. Pneumoperitoneum was then created with CO2 and tolerated well without any adverse changes in the patient's vital signs. A 40mm port was placed in the periumbilical position and the abdominal cavity was explored.  Two 5-mm ports were placed in the right upper quadrant and a 12 mm epigastric port was placed all under direct vision. All skin incisions  were infiltrated with a  local anesthetic agent before making the incision and placing the trocars.   The patient was positioned  in reverse Trendelenburg, tilted slightly to the patient's left.  The gallbladder was identified, and found to be acutely gangrenous and fragile and friable.  The fundus was grasped and retracted cephalad. Adhesions were lysed bluntly. The infundibulum was grasped and retracted laterally, exposing the peritoneum overlying the triangle of Calot. This was then divided and exposed in a blunt fashion. A critical view of the cystic duct and cystic artery was obtained.  Multiple small branches of engorged venous structures both laterally and medially were clipped and incised or divided.  The cystic duct was clearly identified and bluntly dissected.   Once the only structure left was the cystic duct entering the infundibulum, but this was doubly clipped and divided.  Multiple small branches of cystic artery had already been doubly clipped and divided as well.  The gallbladder was taken from the gallbladder fossa in a retrograde fashion with the electrocautery.  Minimal electrocautery was utilized as the gallbladder fossa was edematous and the gallbladder was gangrenous and it lost its blood supply.  The gallbladder was removed and placed in an Endocatch bag. The liver bed was irrigated and inspected. Hemostasis was achieved with the electrocautery. Copious irrigation was utilized and was repeatedly aspirated until clear.  The gallbladder and Endocatch sac were then removed through the epigastric port site.   After ensuring hemostasis was adequate 2 L of fluid been utilized to irrigate, a 10 mm JP drain was brought in through a lateral port site placed in the foramen of Winslow and tied in with 3-0 nylon.  Inspection of the right upper quadrant was performed. No bleeding, bile duct injury or leak, or bowel injury was noted. Pneumoperitoneum  was released.  The epigastric port site was closed with figure-of-eight  0 Vicryl sutures. 4-0 subcuticular Monocryl was used to close the skin. Steristrips and Mastisol and sterile dressings were  applied.  The patient was then extubated and brought to the recovery room in stable condition. Sponge, lap, and needle counts were correct at closure and at the conclusion of the case.   Findings: Acute gangrenous cholecystitis   Estimated Blood Loss: 100 cc         Drains: JP x1         Specimens: Gallbladder           Complications: none               Thomas Ludwig E. Burt Knack, MD, FACS

## 2017-03-30 ENCOUNTER — Encounter: Payer: Self-pay | Admitting: Surgery

## 2017-03-30 LAB — COMPREHENSIVE METABOLIC PANEL
ALK PHOS: 69 U/L (ref 38–126)
ALT: 35 U/L (ref 17–63)
ANION GAP: 7 (ref 5–15)
AST: 39 U/L (ref 15–41)
Albumin: 3.3 g/dL — ABNORMAL LOW (ref 3.5–5.0)
BUN: 16 mg/dL (ref 6–20)
CALCIUM: 8.5 mg/dL — AB (ref 8.9–10.3)
CO2: 26 mmol/L (ref 22–32)
Chloride: 105 mmol/L (ref 101–111)
Creatinine, Ser: 0.77 mg/dL (ref 0.61–1.24)
GFR calc non Af Amer: >60 mL/min (ref >60)
Glucose, Bld: 97 mg/dL (ref 65–99)
Potassium: 3.5 mmol/L (ref 3.5–5.1)
SODIUM: 138 mmol/L (ref 135–145)
Total Bilirubin: 0.8 mg/dL (ref 0.3–1.2)
Total Protein: 6.5 g/dL (ref 6.5–8.1)

## 2017-03-30 LAB — CBC WITH DIFFERENTIAL/PLATELET
Basophils Absolute: 0 10*3/uL (ref 0–0.1)
Basophils Relative: 0 %
EOS ABS: 0.1 10*3/uL (ref 0–0.7)
EOS PCT: 1 %
HCT: 39.2 % — ABNORMAL LOW (ref 40.0–52.0)
HEMOGLOBIN: 13.5 g/dL (ref 13.0–18.0)
LYMPHS ABS: 2.3 10*3/uL (ref 1.0–3.6)
Lymphocytes Relative: 20 %
MCH: 30.4 pg (ref 26.0–34.0)
MCHC: 34.3 g/dL (ref 32.0–36.0)
MCV: 88.6 fL (ref 80.0–100.0)
MONO ABS: 1.5 10*3/uL — AB (ref 0.2–1.0)
MONOS PCT: 13 %
Neutro Abs: 7.7 10*3/uL — ABNORMAL HIGH (ref 1.4–6.5)
Neutrophils Relative %: 66 %
PLATELETS: 163 10*3/uL (ref 150–440)
RBC: 4.43 MIL/uL (ref 4.40–5.90)
RDW: 13.5 % (ref 11.5–14.5)
WBC: 11.6 10*3/uL — ABNORMAL HIGH (ref 3.8–10.6)

## 2017-03-30 LAB — PHENYTOIN LEVEL, TOTAL: PHENYTOIN LVL: 9.1 ug/mL — AB (ref 10.0–20.0)

## 2017-03-30 NOTE — Progress Notes (Signed)
1 Day Post-Op  Subjective: Patient experiencing postsurgical pain but his right upper quadrant pain is much improved.  He wants to stay on clear liquids this morning  Objective: Vital signs in last 24 hours: Temp:  [97.5 F (36.4 C)-101.2 F (38.4 C)] 98.3 F (36.8 C) (12/29 0522) Pulse Rate:  [84-122] 86 (12/29 0522) Resp:  [6-22] 18 (12/29 0522) BP: (95-148)/(48-97) 95/61 (12/29 0522) SpO2:  [92 %-100 %] 98 % (12/29 0522) Weight:  [267 lb (121.1 kg)] 267 lb (121.1 kg) (12/28 1433)    Intake/Output from previous day: 12/28 0701 - 12/29 0700 In: 2841 [I.V.:2841] Out: 645 [Urine:500; Drains:130; Blood:15] Intake/Output this shift: Total I/O In: 575 [P.O.:240; I.V.:335] Out: -   Physical exam:  Vital signs reviewed and stable afebrile since the high fever immediate postop.  No icterus no jaundice abdomen is soft wounds are clean drain shows serosanguineous fluid only    Lab Results: CBC  Recent Labs    03/29/17 0914 03/30/17 0613  WBC 14.3* 11.6*  HGB 15.9 13.5  HCT 46.2 39.2*  PLT 209 163   BMET Recent Labs    03/29/17 0914 03/30/17 0613  NA 135 138  K 3.1* 3.5  CL 102 105  CO2 21* 26  GLUCOSE 102* 97  BUN 17 16  CREATININE 0.94 0.77  CALCIUM 9.2 8.5*   PT/INR No results for input(s): LABPROT, INR in the last 72 hours. ABG No results for input(s): PHART, HCO3 in the last 72 hours.  Invalid input(s): PCO2, PO2  Studies/Results: US Abdomen Limited Ruq  Result Date: 03/29/2017 CLINICAL DATA:  Diffuse abdominal pain for 3 days. EXAM: ULTRASOUND ABDOMEN LIMITED RIGHT UPPER QUADRANT COMPARISON:  CT abdomen and pelvis on 12/26 FINDINGS: Gallbladder: Gallbladder wall is thickened, measuring 7 mm. There is gallbladder sludge but no stones or pericholecystic fluid. No sonographic Murphy's sign. Common bile duct: Diameter: 5 mm Liver: No focal lesion identified. Within normal limits in parenchymal echogenicity. Portal vein is patent on color Doppler imaging  with normal direction of blood flow towards the liver. IMPRESSION: 1. Thickened gallbladder wall and gallbladder sludge. 2. No other features of acute cholecystitis. Electronically Signed   By: Nolon Nations M.D.   On: 03/29/2017 11:11    Anti-infectives: Anti-infectives (From admission, onward)   Start     Dose/Rate Route Frequency Ordered Stop   03/29/17 1800  Ampicillin-Sulbactam (UNASYN) 3 g in sodium chloride 0.9 % 100 mL IVPB     3 g 200 mL/hr over 30 Minutes Intravenous Every 6 hours 03/29/17 1237        Assessment/Plan: s/p Procedure(s): LAPAROSCOPIC CHOLECYSTECTOMY   LFTs are normal Pt doing well, will adv diet, cont IV abx Continue drain for now    Florene Glen, MD, Allegra Grana  03/30/2017

## 2017-03-30 NOTE — Plan of Care (Signed)
Pt with frequent c/o pain. Encouraged ambulation and has walked in the halls x1 this shift. Tolerating advanced diet.

## 2017-03-31 MED ORDER — HYDROCODONE-ACETAMINOPHEN 5-325 MG PO TABS
1.0000 | ORAL_TABLET | ORAL | Status: DC | PRN
  Administered 2017-03-31 – 2017-04-01 (×3): 2 via ORAL
  Filled 2017-03-31 (×4): qty 2

## 2017-03-31 NOTE — Progress Notes (Signed)
[facility] Hospital Day(s): 2.   Post op day(s): 2 Days Post-Op.   Interval History: Patient seen and examined, no acute events or new complaints overnight. Patient reports his abdominal pain continues to improve, though he has continued to request IV pain medication, denies N/V, fever/chills, CP, and SOB. He has been ambulating in the halls at the encouragement of his wife and tolerating increasing small amounts of food.  Review of Systems:  Constitutional: denies fever, chills  HEENT: denies cough or congestion  Respiratory: denies any shortness of breath  Cardiovascular: denies chest pain or palpitations  Gastrointestinal: abdominal pain, N/V, and bowel function as per interval history Genitourinary: denies burning with urination or urinary frequency Musculoskeletal: denies pain, decreased motor or sensation Integumentary: denies any other rashes or skin discolorations except post-surgical abdominal wounds Neurological: denies HA or vision/hearing changes   Vital signs in last 24 hours: [min-max] current  Temp:  [98.2 F (36.8 C)-99.1 F (37.3 C)] 98.2 F (36.8 C) (12/30 1250) Pulse Rate:  [83-91] 84 (12/30 1250) Resp:  [16] 16 (12/30 0612) BP: (103-140)/(68-84) 140/84 (12/30 1250) SpO2:  [95 %-96 %] 96 % (12/30 1250)     Height: 6\' 2"  (188 cm) Weight: 267 lb (121.1 kg) BMI (Calculated): 34.27   Intake/Output this shift:  Total I/O In: 2150 [P.O.:480; I.V.:1670] Out: 280 [Urine:240; Drains:40]   Intake/Output last 2 shifts:  @IOLAST2SHIFTS @   Physical Exam:  Constitutional: alert, cooperative and no distress  HENT: normocephalic without obvious abnormality  Eyes: PERRL, EOM's grossly intact and symmetric  Neuro: CN II - XII grossly intact and symmetric without deficit  Respiratory: breathing non-labored at rest  Cardiovascular: regular rate and sinus rhythm  Gastrointestinal: soft and non-distended with abdominal binder in place, mild-/moderate-  peri-incisional tenderness to palpation, incisions well-approximated without peri-incisional erythema or drainage Musculoskeletal: UE and LE FROM, no edema or wounds, motor and sensation grossly intact, NT   Labs:  CBC Latest Ref Rng & Units 03/30/2017 03/29/2017 03/27/2017  WBC 3.8 - 10.6 K/uL 11.6(H) 14.3(H) 16.6(H)  Hemoglobin 13.0 - 18.0 g/dL 13.5 15.9 16.3  Hematocrit 40.0 - 52.0 % 39.2(L) 46.2 47.9  Platelets 150 - 440 K/uL 163 209 227   CMP Latest Ref Rng & Units 03/30/2017 03/29/2017 03/27/2017  Glucose 65 - 99 mg/dL 97 102(H) 97  BUN 6 - 20 mg/dL 16 17 21(H)  Creatinine 0.61 - 1.24 mg/dL 0.77 0.94 0.99  Sodium 135 - 145 mmol/L 138 135 138  Potassium 3.5 - 5.1 mmol/L 3.5 3.1(L) 3.6  Chloride 101 - 111 mmol/L 105 102 106  CO2 22 - 32 mmol/L 26 21(L) 22  Calcium 8.9 - 10.3 mg/dL 8.5(L) 9.2 9.5  Total Protein 6.5 - 8.1 g/dL 6.5 7.8 7.9  Total Bilirubin 0.3 - 1.2 mg/dL 0.8 1.5(H) 0.5  Alkaline Phos 38 - 126 U/L 69 86 89  AST 15 - 41 U/L 39 30 35  ALT 17 - 63 U/L 35 26 25   Imaging studies: No new pertinent imaging studies   Assessment/Plan: 46 y.o. male doing overall well with improving post-surgical abdominal pain and tolerating advancement of diet, LFT's WNL 2 Days Post-Op s/p laparoscopic cholecystectomy for acute gangrenous cholecystitis, complicated by pertinent comorbidities including HTN, chronic back pain, GERD, migraine headaches, generalized anxiety disorder, and major depression disorder.   - regular diet and oral medications  - anticipate removal of surgically placed drain prior to discharge  - pain control prn (minimize narcotics, reports allergy to NSAIDs)   -  medical management of comorbidities (home medications   - DVT prophylaxis, ambulation encouraged  - anticipate discharge tomorrow  All of the above findings and recommendations were discussed with the patient and patient's family, and all of patient's and family's questions were answered to their  expressed satisfaction.  -- Marilynne Drivers Rosana Hoes, MD, [place]: [facility] - Partnering for exceptional care. Office: [phone]

## 2017-03-31 NOTE — Progress Notes (Signed)
2 Days Post-Op  Subjective: Patient is status post laparoscopic cholecystectomy.  He still has continued pain but is feeling better.  He is tolerating a regular diet.  Objective: Vital signs in last 24 hours: Temp:  [98.2 F (36.8 C)-99.1 F (37.3 C)] 98.2 F (36.8 C) (12/30 1250) Pulse Rate:  [83-91] 84 (12/30 1250) Resp:  [16] 16 (12/30 0612) BP: (103-140)/(68-84) 140/84 (12/30 1250) SpO2:  [95 %-96 %] 96 % (12/30 1250) Last BM Date: 03/31/17  Intake/Output from previous day: 12/29 0701 - 12/30 0700 In: 2767.8 [P.O.:660; I.V.:1407.8; IV Piggyback:700] Out: 750 [Urine:700; Drains:50] Intake/Output this shift: No intake/output data recorded.  Physical exam:  Vital signs are stable no bile in drain wounds are clean.  Drainage.  Lab Results: CBC  Recent Labs    03/29/17 0914 03/30/17 0613  WBC 14.3* 11.6*  HGB 15.9 13.5  HCT 46.2 39.2*  PLT 209 163   BMET Recent Labs    03/29/17 0914 03/30/17 0613  NA 135 138  K 3.1* 3.5  CL 102 105  CO2 21* 26  GLUCOSE 102* 97  BUN 17 16  CREATININE 0.94 0.77  CALCIUM 9.2 8.5*   PT/INR No results for input(s): LABPROT, INR in the last 72 hours. ABG No results for input(s): PHART, HCO3 in the last 72 hours.  Invalid input(s): PCO2, PO2  Studies/Results: No results found.  Anti-infectives: Anti-infectives (From admission, onward)   Start     Dose/Rate Route Frequency Ordered Stop   03/29/17 1800  Ampicillin-Sulbactam (UNASYN) 3 g in sodium chloride 0.9 % 100 mL IVPB     3 g 200 mL/hr over 30 Minutes Intravenous Every 6 hours 03/29/17 1237        Assessment/Plan: s/p Procedure(s): LAPAROSCOPIC CHOLECYSTECTOMY   Patient doing quite well at this time he continues to have pain following her laparoscopic cholecystectomy for acute cholecystitis.  I have removed his drain today and placed a new dressing.  He will be discharged tomorrow in all likelihood.  Florene Glen, MD, FACS  03/31/2017

## 2017-04-01 LAB — HIV ANTIBODY (ROUTINE TESTING W REFLEX): HIV Screen 4th Generation wRfx: NONREACTIVE

## 2017-04-01 MED ORDER — AMOXICILLIN-POT CLAVULANATE 875-125 MG PO TABS: 1.0000 | ORAL_TABLET | Freq: Two times a day (BID) | ORAL | 0 refills | Status: AC

## 2017-04-01 MED ORDER — ACETAMINOPHEN 500 MG PO TABS
ORAL_TABLET | ORAL | Status: AC
  Filled 2017-04-01: qty 1

## 2017-04-01 MED ORDER — HYDROCODONE-ACETAMINOPHEN 5-325 MG PO TABS: 1.0000 | ORAL_TABLET | ORAL | 0 refills | Status: DC | PRN

## 2017-04-01 NOTE — Progress Notes (Signed)
Discharge teaching given to patient, patient verbalized understanding and had no questions. Patient IV removed. Patient will be transported home by family. All patient belongings gathered prior to leaving.  

## 2017-04-01 NOTE — Progress Notes (Signed)
[facility] responded to an OR for an AD. [place] educated the Pt and his wife on the document. Pt will discharge today and will complete the document at home.    04/01/17 1200  Clinical Encounter Type  Visited With Patient;Patient and family together  Visit Type Initial;Spiritual support  Referral From Nurse  Spiritual Encounters  Spiritual Needs Literature

## 2017-04-03 LAB — SURGICAL PATHOLOGY

## 2017-04-04 NOTE — Discharge Summary (Signed)
Physician Discharge Summary  Patient ID: Thomas Cole MRN: [id number] DOB/AGE: [birth year] 47 y.o.  Admit date: 03/29/2017 Discharge date: 04/04/2017  Admission Diagnoses:  Discharge Diagnoses:  Active Problems:   Acute cholecystitis   Discharged Condition: good  Hospital Course: 47 y.o. male presented to [facility] ED for abdominal pain. Workup was found to be significant for ultrasound imaging demonstrating acute cholecystitis. Informed consent was obtained and documented, and patient underwent laparoscopic cholecystectomy Burt Knack, 03/29/2017) with intra-operative findings of gangrenous cholecystitis.  Post-operatively, patient's pain improved/resolved and advancement of patient's diet and ambulation were well-tolerated. The remainder of patient's hospital course was essentially unremarkable. Patient's surgical placed drain was removed, and discharge planning was initiated accordingly with patient safely able to be discharged home with appropriate discharge instructions, antibiotics, pain control, and outpatient surgical follow-up after all of his and family's questions were answered to their expressed satisfaction.  Consults: None  Significant Diagnostic Studies: radiology: Ultrasound: acute cholecystitis  Treatments: IV hydration, antibiotics: Unasyn, analgesia, and surgery: laparoscopic cholecystectomy  Discharge Exam: Blood pressure 130/82, pulse 72, temperature 98.5 F (36.9 C), temperature source Oral, resp. rate 16, height 6\' 2"  (1.88 m), weight 267 lb (121.1 kg), SpO2 98 %. General appearance: alert, cooperative and no distress GI: abdomen soft and non-distended with appropriate mild peri-incisional tenderness to palpation, post-surgical incisions well-approximated without surrounding erythema or drainage  Disposition: 01-Home or Self Care   Allergies as of 04/01/2017      Reactions   Amitriptyline Other (See Comments)   Felt really bad- psychologically   Bee Venom Itching,  Swelling   Chlorhexidine Itching   REACTION TO WIPES/ CLOTHES ONLY==he can tolerate the SCRUB LIQUID   Morphine And Related    Makes him restless    Carbamazepine Itching, Rash   Meloxicam Itching, Rash   Sulfa Antibiotics Itching, Rash      Medication List    TAKE these medications   albuterol 108 (90 Base) MCG/ACT inhaler Commonly known as:  PROVENTIL HFA;VENTOLIN HFA Inhale 2 puffs into the lungs every 6 (six) hours as needed for wheezing or shortness of breath.   ALPRAZolam 0.5 MG tablet Commonly known as:  XANAX TAKE 1 TABLET BY MOUTH EVERY DAY AT BEDTIME AS NEEDED FOR ANXIETY   amoxicillin-clavulanate 875-125 MG tablet Commonly known as:  AUGMENTIN Take 1 tablet by mouth 2 (two) times daily for 4 days.   cyclobenzaprine 10 MG tablet Commonly known as:  FLEXERIL TAKE 1 TABLET (10 MG TOTAL) BY MOUTH 3 (THREE) TIMES DAILY AS NEEDED FOR MUSCLE SPASMS.   docusate sodium 100 MG capsule Commonly known as:  COLACE Take 1 capsule (100 mg total) by mouth 2 (two) times daily.   furosemide 20 MG tablet Commonly known as:  LASIX TAKE 1 TABLET (20 MG TOTAL) BY MOUTH DAILY.   HYDROcodone-acetaminophen 5-325 MG tablet Commonly known as:  NORCO/VICODIN Take 1-2 tablets by mouth every 4 (four) hours as needed for severe pain.   losartan-hydrochlorothiazide 50-12.5 MG tablet Commonly known as:  HYZAAR Take 1 tablet by mouth daily.   omeprazole 20 MG capsule Commonly known as:  PRILOSEC Take x 2 x 30 m before bfast What changed:    how much to take  how to take this  when to take this  additional instructions   phenytoin 100 MG ER capsule Commonly known as:  DILANTIN TAKE 2 [place] 3 CAPSULES BY MOUTH EVERY EVENING What changed:    how much to take  how to  take this  when to take this  additional instructions   polyethylene glycol powder powder Commonly known as:  GLYCOLAX/MIRALAX Take 17 g by mouth 2 (two) times daily.  Please take 17 g twice daily until you begin having multiple bowel movements then you may discontinue use of MiraLAX.   traMADol 50 MG tablet Commonly known as:  ULTRAM Take 1 tablet (50 mg total) by mouth every 8 (eight) hours as needed.      Follow-up Information    Vickie Epley, MD. Go on 04/10/2017.   Specialty:  General Surgery Why:  Wednesday at 9:15am for hospital follow-up Contact information: [address] [place] [address] [phone]           Signed: Vickie Epley 04/04/2017, 4:12 PM

## 2017-04-08 ENCOUNTER — Telehealth: Payer: Self-pay | Admitting: Neurology

## 2017-04-08 NOTE — Telephone Encounter (Signed)
Patient called and said that he had Emergency Surgery for his Gallbladder last week and he was scheduled to have an MRI for this morning and it was cancelled. He was told he has to wait 6 to 8 weeks to have his MRI. He was unsure why it was cancelled. Please Call. Thanks

## 2017-04-09 NOTE — Telephone Encounter (Signed)
Pls let him know I reviewed all the records from his surgery, there is no mention of seizure. It could have been a reaction to anesthesia. Continue to monitor for now. Proceed with MRI, then if normal, would do the prolonged EEG. Thanks

## 2017-04-09 NOTE — Telephone Encounter (Signed)
Spoke with pt relaying message below.  He states that shortly after we spoke yesterday that he was contacted by [facility] to reschedule his MRI for Monday Jan. 14, 2019.  Pt also stated that he forgot to tell me yesterday, but he was told that he may have had a seizure while under general anesthesia.  Was told that he tensed and had slight tremors.  But is unaware of anything else.  He just wanted to make sure that Dr. Delice Lesch was aware.

## 2017-04-09 NOTE — Telephone Encounter (Signed)
Called [facility] yesterday, per their appointment note, pt canceled his MRI due to having surgery.  Spoke with pt who states that he did NOT cancel his MRI, but that he was contacted by [facility] stating that it was canceled - stating that they require 6 weeks after surgery before they will preform MRI.  Called [facility] again.  Again was told that pt canceled appointment per their appointment notes.  After some "digging" the lady I spoke with talked with one of the tech's who confirmed that [facility] canceled MRI due to pt having surgery.

## 2017-04-10 ENCOUNTER — Ambulatory Visit (INDEPENDENT_AMBULATORY_CARE_PROVIDER_SITE_OTHER): Payer: 59 | Admitting: Surgery

## 2017-04-10 ENCOUNTER — Encounter: Payer: Self-pay | Admitting: Surgery

## 2017-04-10 VITALS — BP 120/82 | HR 80 | Temp 98.3°F | Wt 259.0 lb

## 2017-04-10 DIAGNOSIS — Z09 Encounter for follow-up examination after completed treatment for conditions other than malignant neoplasm: Secondary | ICD-10-CM

## 2017-04-10 NOTE — Patient Instructions (Addendum)
For pain please start taking Ibuprofen 600 MG every 8 hours.   GENERAL POST-OPERATIVE PATIENT INSTRUCTIONS   WOUND CARE INSTRUCTIONS:  Keep a dry clean dressing on the wound if there is drainage. The initial bandage may be removed after 24 hours.  Once the wound has quit draining you may leave it open to air.  If clothing rubs against the wound or causes irritation and the wound is not draining you may cover it with a dry dressing during the daytime.  Try to keep the wound dry and avoid ointments on the wound unless directed to do so.  If the wound becomes bright red and painful or starts to drain infected material that is not clear, please contact your physician immediately.  If the wound is mildly pink and has a thick firm ridge underneath it, this is normal, and is referred to as a healing ridge.  This will resolve over the next 4-6 weeks.  BATHING: You may shower if you have been informed of this by your surgeon. However, Please do not submerge in a tub, hot tub, or pool until incisions are completely sealed or have been told by your surgeon that you may do so.  DIET:  You may eat any foods that you can tolerate.  It is a good idea to eat a high fiber diet and take in plenty of fluids to prevent constipation.  If you do become constipated you may want to take a mild laxative or take ducolax tablets on a daily basis until your bowel habits are regular.  Constipation can be very uncomfortable, along with straining, after recent surgery.  ACTIVITY:  You are encouraged to cough and deep breath or use your incentive spirometer if you were given one, every 15-30 minutes when awake.  This will help prevent respiratory complications and low grade fevers post-operatively if you had a general anesthetic.  You may want to hug a pillow when coughing and sneezing to add additional support to the surgical area, if you had abdominal or chest surgery, which will decrease pain during these times.  You are encouraged  to walk and engage in light activity for the next two weeks.  You should not lift more than 20 pounds, until 04/28/2016 as it could put you at increased risk for complications.  Twenty pounds is roughly equivalent to a plastic bag of groceries. At that time- Listen to your body when lifting, if you have pain when lifting, stop and then try again in a few days. Soreness after doing exercises or activities of daily living is normal as you get back in to your normal routine.  MEDICATIONS:  Try to take narcotic medications and anti-inflammatory medications, such as tylenol, ibuprofen, naprosyn, etc., with food.  This will minimize stomach upset from the medication.  Should you develop nausea and vomiting from the pain medication, or develop a rash, please discontinue the medication and contact your physician.  You should not drive, make important decisions, or operate machinery when taking narcotic pain medication.  SUNBLOCK Use sun block to incision area over the next year if this area will be exposed to sun. This helps decrease scarring and will allow you avoid a permanent darkened area over your incision.  QUESTIONS:  Please feel free to call our office if you have any questions, and we will be glad to assist you. [phone]

## 2017-04-10 NOTE — Progress Notes (Signed)
04/10/2017  HPI: Patient is status post laparoscopic cholecystectomy with Dr. Burt Knack on 12/28.  Intraoperatively he was found to have gangrenous cholecystitis.  JP drain was left in place and was removed at discharge on 12/31.  He presents today for follow-up.  He reports that he still having some pain in the epigastric incision and does feel fatigued.  Otherwise has some aches and pains here and there has not gotten back to his baseline yet.  Did have some constipation recently.  Denies any fevers or chills, nausea or vomiting.  Has been tolerating a regular diet.  Vital signs: BP 120/82   Pulse 80   Temp 98.3 F (36.8 C) (Oral)   Wt 117.5 kg (259 lb)   BMI 33.25 kg/m    Physical Exam: Constitutional: No acute distress Abdomen: Soft, nondistended, appropriately tender to palpation over the epigastric incision.  All incisions are healing well and are clean dry and intact with no evidence of infection.  Assessment/Plan: 47 year old male status post laparoscopic cholecystectomy.  -Reassured the patient that currently given how badly infected his gallbladder was, it is not unexpected to still feel fatigued and run down with some aches.  At this point reassured the patient that this will continue to improve but may take up to 4 weeks for him to return back to his baseline. -Patient did asked when he could return back to work and at this point he could return to work with half duties but otherwise would need to wait until 1/28 to return to work with full duties. -Patient may follow-up with [place] on an as-needed basis, but did give the patient return precautions and instructions.   Melvyn Neth, [place]

## 2017-04-15 ENCOUNTER — Telehealth: Payer: Self-pay | Admitting: Neurology

## 2017-04-15 NOTE — Telephone Encounter (Signed)
Spoke with pt.  He states that his MRI has been re-scheduled for Monday January 21 @ 8:15AM.  Ok to send Rx for Valium 5mg  #2?

## 2017-04-15 NOTE — Telephone Encounter (Signed)
Yes, thanks

## 2017-04-15 NOTE — Telephone Encounter (Signed)
Rx called in to pt's verified preferred pharmacy

## 2017-04-15 NOTE — Telephone Encounter (Signed)
Pt called and said that he went for his MRI today but was to claustrophobic and had to reschedule for Monday, wanted to know if something could be prescribed to him before his next appointment

## 2017-04-21 ENCOUNTER — Other Ambulatory Visit: Payer: Self-pay

## 2017-04-21 ENCOUNTER — Encounter: Payer: Self-pay | Admitting: Emergency Medicine

## 2017-04-21 ENCOUNTER — Emergency Department: Payer: 59

## 2017-04-21 ENCOUNTER — Emergency Department
Admission: EM | Admit: 2017-04-21 | Discharge: 2017-04-21 | Disposition: A | Discharge status: Home / Self Care | Payer: 59 | Attending: Emergency Medicine | Admitting: Emergency Medicine

## 2017-04-21 DIAGNOSIS — J45909 Unspecified asthma, uncomplicated: Secondary | ICD-10-CM | POA: Insufficient documentation

## 2017-04-21 DIAGNOSIS — I10 Essential (primary) hypertension: Secondary | ICD-10-CM | POA: Insufficient documentation

## 2017-04-21 DIAGNOSIS — R1011 Right upper quadrant pain: Secondary | ICD-10-CM | POA: Insufficient documentation

## 2017-04-21 DIAGNOSIS — R109 Unspecified abdominal pain: Secondary | ICD-10-CM | POA: Diagnosis not present

## 2017-04-21 LAB — URINALYSIS, COMPLETE (UACMP) WITH MICROSCOPIC
Bacteria, UA: NONE SEEN
Bilirubin Urine: NEGATIVE
GLUCOSE, UA: NEGATIVE mg/dL
Hgb urine dipstick: NEGATIVE
Ketones, ur: NEGATIVE mg/dL
Leukocytes, UA: NEGATIVE
Nitrite: NEGATIVE
PROTEIN: NEGATIVE mg/dL
RBC / HPF: NONE SEEN RBC/hpf (ref 0–5)
SQUAMOUS EPITHELIAL / LPF: NONE SEEN
Specific Gravity, Urine: 1.005 (ref 1.005–1.030)
WBC UA: NONE SEEN WBC/hpf (ref 0–5)
pH: 7 (ref 5.0–8.0)

## 2017-04-21 LAB — CBC
HCT: 43.7 % (ref 40.0–52.0)
Hemoglobin: 15 g/dL (ref 13.0–18.0)
MCH: 30.7 pg (ref 26.0–34.0)
MCHC: 34.3 g/dL (ref 32.0–36.0)
MCV: 89.5 fL (ref 80.0–100.0)
PLATELETS: 172 10*3/uL (ref 150–440)
RBC: 4.89 MIL/uL (ref 4.40–5.90)
RDW: 13.2 % (ref 11.5–14.5)
WBC: 7.8 10*3/uL (ref 3.8–10.6)

## 2017-04-21 LAB — COMPREHENSIVE METABOLIC PANEL
ALBUMIN: 4.2 g/dL (ref 3.5–5.0)
ALT: 23 U/L (ref 17–63)
AST: 30 U/L (ref 15–41)
Alkaline Phosphatase: 86 U/L (ref 38–126)
Anion gap: 9 (ref 5–15)
BUN: 13 mg/dL (ref 6–20)
CHLORIDE: 101 mmol/L (ref 101–111)
CO2: 28 mmol/L (ref 22–32)
CREATININE: 0.93 mg/dL (ref 0.61–1.24)
Calcium: 9.4 mg/dL (ref 8.9–10.3)
GFR calc non Af Amer: >60 mL/min (ref >60)
GLUCOSE: 112 mg/dL — AB (ref 65–99)
Potassium: 3.7 mmol/L (ref 3.5–5.1)
SODIUM: 138 mmol/L (ref 135–145)
Total Bilirubin: 0.7 mg/dL (ref 0.3–1.2)
Total Protein: 7.4 g/dL (ref 6.5–8.1)

## 2017-04-21 LAB — LIPASE, BLOOD: Lipase: 51 U/L (ref 11–51)

## 2017-04-21 MED ORDER — SODIUM CHLORIDE 0.9 % IV BOLUS (SEPSIS)
1000.0000 mL | Freq: Once | INTRAVENOUS | Status: AC
  Administered 2017-04-21: 1000 mL via INTRAVENOUS

## 2017-04-21 MED ORDER — ONDANSETRON HCL 4 MG/2ML IJ SOLN
4.0000 mg | Freq: Once | INTRAMUSCULAR | Status: AC
  Administered 2017-04-21: 4 mg via INTRAVENOUS
  Filled 2017-04-21: qty 2

## 2017-04-21 MED ORDER — FENTANYL CITRATE (PF) 100 MCG/2ML IJ SOLN
100.0000 ug | Freq: Once | INTRAMUSCULAR | Status: AC
  Administered 2017-04-21: 100 ug via INTRAVENOUS
  Filled 2017-04-21: qty 2

## 2017-04-21 NOTE — Discharge Instructions (Signed)
Your labs and ultrasound do not show any evidence of surgical complication. Please follow up with Dr. Burt Knack tomorrow if the pain continues. If you develop new or worsening pain, fever, vomiting, or any new symptoms concerning to you please return to the ER.

## 2017-04-21 NOTE — ED Notes (Signed)

## 2017-04-21 NOTE — ED Triage Notes (Signed)
Pt presents to ED via POV from home c/o generalized abd pain. Hx surgery by Dr. Burt Knack 03/29/17 for gangrenenous gall bladder. Presents with pink rash to abdomen. Pt appears uncomfortable, sitting straight upright in chair unable to move easily. Abd very tender to any palpation.

## 2017-04-21 NOTE — ED Provider Notes (Signed)
[facility] Emergency Department Provider Note  ____________________________________________  Time seen: Approximately 4:33 PM  I have reviewed the triage vital signs and the nursing notes.   HISTORY  Chief Complaint Abdominal Pain   HPI Thomas Cole is a 47 y.o. male POD 23 from lap chole for acute gangrenous cholecystitis who presents for evaluation of worsening abdominal pain. Patient reports that his pain started to get worse 3 days ago. He describes the pain as a burning sensation alternating with a pins and needles that is located in the epigastric and right upper quadrant, constant and nonradiating. Patient reports no appetite today but denies nausea, vomiting, fever, chills. He is having normal bowel movements. He called his surgeon Dr. Burt Knack who recommended that he return to the emergency room for evaluation.  Past Medical History:  Diagnosis Date  . Anxiety   . Asthma   . Back problem   . Benign essential hypertension 11/30/2016  . Cervical spondylosis without myelopathy 09/17/2012  . Complication of anesthesia    occ takes longer to wake  . Depression   . GERD (gastroesophageal reflux disease)   . Hypertension   . Labile hypertension 03/19/2013   No current neuro changes, headache much improved. No clear sign of intracranial bleed. NO indication for head CT>  BP much better now... Fluctuates a lot.  Will eval for pheochromocytoma. As recommended at last cardiology OV.. Will prescribe hydralazine to use as needed for BP fluctuations.  Close follow up with PCP.   . Major depressive disorder, recurrent episode, severe ([place]) 06/23/2015  . Migraine headache   . Migraines 11/11/2013  . S/P appendectomy 04/09/2011  . Seizure ([place]) 11/30/2016  . Seizures ([place])    last 87  . Shortness of breath   . SOB (shortness of breath) 08/06/2014   - [facility] eval with cpst 02/20/2008 exercise testing demonstrateed normal functional capabilities and a normal  cardiopulmonary response to exercise. - 09/16/2014  Walked RA x 3 laps @ 185 ft each stopped due to end of study/ nl pace/ no desat  - spirometry 09/16/2014 > no obst/ min restriction       Patient Active Problem List   Diagnosis Date Noted  . Acute cholecystitis 03/29/2017  . Anxiety 11/30/2016  . Benign essential hypertension 11/30/2016  . Seizure ([place]) 11/30/2016  . Localization-related symptomatic epilepsy and epileptic syndromes with complex partial seizures, not intractable, without status epilepticus ([place]) 02/21/2016  . Generalized anxiety disorder 06/23/2015  . Major depressive disorder, recurrent episode, severe ([place]) 06/23/2015  . PTSD (post-traumatic stress disorder) 06/16/2015  . SOB (shortness of breath) 08/06/2014  . Obesity (BMI 30-39.9) 11/11/2013  . Migraines 11/11/2013  . Asthma, mild intermittent 11/11/2013  . History of seizures 11/11/2013  . GERD without esophagitis 11/11/2013  . Depression 11/11/2013  . Labile hypertension 03/19/2013  . Cervical spondylosis without myelopathy 09/17/2012  . S/P appendectomy 04/09/2011  . Sleep apnea 05/25/2010    Past Surgical History:  Procedure Laterality Date  . ANTERIOR CERVICAL CORPECTOMY N/A 09/15/2012   Procedure: Cervical Three-Four,Cervical Six-Seven Anterior cervical decompression/diskectomy fusion with Cervical Four Corpectomy;  Surgeon: Kristeen Miss, MD;  Location: [place] [facility];  Service: Neurosurgery;  Laterality: N/A;  Cervical Three-Four,Cervical Six-Seven  Anterior cervical decompression/diskectomy fusion with Cervical four Corpectomy  . APPENDECTOMY  12  . CERVICAL DISC SURGERY    . CHOLECYSTECTOMY N/A 03/29/2017   Procedure: LAPAROSCOPIC CHOLECYSTECTOMY;  Surgeon: Florene Glen, MD;  Location: [facility] ORS;  Service: General;  Laterality: N/A;  .  KNEE SURGERY     bilateral  . POSTERIOR FUSION CERVICAL SPINE      Prior to Admission medications   Medication Sig Start Date End Date Taking? Authorizing Provider    albuterol (PROVENTIL HFA;VENTOLIN HFA) 108 (90 BASE) MCG/ACT inhaler Inhale 2 puffs into the lungs every 6 (six) hours as needed for wheezing or shortness of breath. 11/11/13   Jearld Fenton, NP  ALPRAZolam Duanne Moron) 0.5 MG tablet TAKE 1 TABLET BY MOUTH EVERY DAY AT BEDTIME AS NEEDED FOR ANXIETY Patient not taking: Reported on 03/27/2017 10/26/16   Coral Spikes, DO  cyclobenzaprine (FLEXERIL) 10 MG tablet TAKE 1 TABLET (10 MG TOTAL) BY MOUTH 3 (THREE) TIMES DAILY AS NEEDED FOR MUSCLE SPASMS. Patient not taking: Reported on 03/29/2017 10/26/16   Coral Spikes, DO  docusate sodium (COLACE) 100 MG capsule Take 1 capsule (100 mg total) by mouth 2 (two) times daily. Patient not taking: Reported on 04/10/2017 03/27/17 04/26/17  Harvest Dark, MD  furosemide (LASIX) 20 MG tablet TAKE 1 TABLET (20 MG TOTAL) BY MOUTH DAILY. Patient not taking: Reported on 03/27/2017 10/01/14   Rubbie Battiest, RN  HYDROcodone-acetaminophen (NORCO/VICODIN) 5-325 MG tablet Take 1-2 tablets by mouth every 4 (four) hours as needed for severe pain. 04/01/17   Vickie Epley, MD  losartan-hydrochlorothiazide (HYZAAR) 50-12.5 MG tablet Take 1 tablet by mouth daily.  11/30/16 11/30/17  [provider]  omeprazole (PRILOSEC) 20 MG capsule Take x 2 x 30 m before bfast Patient taking differently: Take 20 mg by mouth 2 (two) times daily before a meal. Take x 2 x 30 m before bfast 09/16/14   Tanda Rockers, MD  phenytoin (DILANTIN) 100 MG ER capsule TAKE 2 CAPSULES BY MOUTH EVERY MORNING AND TAKE 3 CAPSULES BY MOUTH EVERY EVENING Patient taking differently: Take 500 mg by mouth daily. TAKE 2 CAPSULES BY MOUTH EVERY MORNING AND TAKE 3 CAPSULES BY MOUTH EVERY EVENING 02/25/17   Cameron Sprang, MD  polyethylene glycol powder (GLYCOLAX/MIRALAX) powder Take 17 g by mouth 2 (two) times daily. Please take 17 g twice daily until you begin having multiple bowel movements then you may discontinue use of MiraLAX. Patient not taking:  Reported on 04/10/2017 03/27/17   Harvest Dark, MD  traMADol (ULTRAM) 50 MG tablet Take 1 tablet (50 mg total) by mouth every 8 (eight) hours as needed. Patient not taking: Reported on 04/10/2017 09/02/14   Rubbie Battiest, RN    Allergies Amitriptyline; Bee venom; Chlorhexidine; Morphine and related; Carbamazepine; Meloxicam; and Sulfa antibiotics  Family History  Problem Relation Age of Onset  . Coronary artery disease Mother   . Hypertension Mother   . Mental illness Mother   . Arthritis Mother   . Asthma Mother   . Coronary artery disease Father   . Heart disease Father   . Arthritis Father   . Arthritis Maternal Grandmother   . Heart disease Maternal Grandmother   . Hypertension Maternal Grandmother   . Arthritis Maternal Grandfather   . Heart disease Maternal Grandfather   . Hypertension Maternal Grandfather   . Arthritis Paternal Grandmother   . Heart disease Paternal Grandmother   . Hypertension Paternal Grandmother   . Arthritis Paternal Grandfather   . Heart disease Paternal Grandfather   . Hypertension Paternal Grandfather   . Cancer Neg Hx   . Diabetes Neg Hx   . Stroke Neg Hx     Social History Social History   Tobacco Use  . Smoking  status: Never Smoker  . Smokeless tobacco: Never Used  Substance Use Topics  . Alcohol use: No    Alcohol/week: 0.0 oz  . Drug use: No    Review of Systems  Constitutional: Negative for fever. Eyes: Negative for visual changes. ENT: Negative for sore throat. Neck: No neck pain  Cardiovascular: Negative for chest pain. Respiratory: Negative for shortness of breath. Gastrointestinal: + abdominal pain. No vomiting or diarrhea. Genitourinary: Negative for dysuria. Musculoskeletal: Negative for back pain. Skin: Negative for rash. Neurological: Negative for headaches, weakness or numbness. Psych: No SI or HI  ____________________________________________   PHYSICAL EXAM:  VITAL SIGNS: ED Triage Vitals  Enc Vitals  Group     BP 04/21/17 1603 (!) 129/47     Pulse Rate 04/21/17 1603 77     Resp 04/21/17 1603 18     Temp 04/21/17 1603 98.1 F (36.7 C)     Temp Source 04/21/17 1603 Oral     SpO2 04/21/17 1603 98 %     Weight 04/21/17 1604 259 lb (117.5 kg)     Height 04/21/17 1604 6\' 2"  (1.88 m)     Head Circumference --      Peak Flow --      Pain Score 04/21/17 1603 6     Pain Loc --      Pain Edu? --      Excl. in [place]? --     Constitutional: Alert and oriented. Well appearing and in no apparent distress. HEENT:      Head: Normocephalic and atraumatic.         Eyes: Conjunctivae are normal. Sclera is non-icteric.       Mouth/Throat: Mucous membranes are moist.       Neck: Supple with no signs of meningismus. Cardiovascular: Regular rate and rhythm. No murmurs, gallops, or rubs. 2+ symmetrical distal pulses are present in all extremities. No JVD. Respiratory: Normal respiratory effort. Lungs are clear to auscultation bilaterally. No wheezes, crackles, or rhonchi.  Gastrointestinal: Soft, ttp over the RUQ and epigastric region, non distended with positive bowel sounds. No rebound or guarding. Genitourinary: No CVA tenderness. Musculoskeletal: Nontender with normal range of motion in all extremities. No edema, cyanosis, or erythema of extremities. Neurologic: Normal speech and language. Face is symmetric. Moving all extremities. No gross focal neurologic deficits are appreciated. Skin: Skin is warm, dry and intact. No rash noted. Psychiatric: Mood and affect are normal. Speech and behavior are normal.  ____________________________________________   LABS (all labs ordered are listed, but only abnormal results are displayed)  Labs Reviewed  COMPREHENSIVE METABOLIC PANEL - Abnormal; Notable for the following components:      Result Value   Glucose, Bld 112 (*)    All other components within normal limits  URINALYSIS, COMPLETE (UACMP) WITH MICROSCOPIC - Abnormal; Notable for the following  components:   Color, Urine STRAW (*)    APPearance CLEAR (*)    All other components within normal limits  LIPASE, BLOOD  CBC   ____________________________________________  EKG  none  ____________________________________________  RADIOLOGY  RUQ [place]: 1. No complication following cholecystectomy by ultrasound evaluation. 2. No abnormal fluid collections. 3. No biliary duct dilatation. Normal common bile duct.  ____________________________________________   PROCEDURES  Procedure(s) performed: None Procedures Critical Care performed:  None ____________________________________________   INITIAL IMPRESSION / ASSESSMENT AND PLAN / ED COURSE   47 y.o. male POD 23 from lap chole for acute gangrenous cholecystitis who presents for evaluation of worsening abdominal pain and  decreased appetite x 3 days. No fever or vomiting, vital signs are within normal limits, patient is afebrile, abdomen is soft with tenderness to palpation in the right upper quadrant epigastric region, no rebound or guarding, well healing surgical scar with no evidence of cellulitis. Labs are within normal limits with normal LFTs, normal lipase, normal T bili, normal white count. Discussed patient's presentation and labs with Dr. Burt Knack who is patient's surgeon and he recommended a right upper quadrant ultrasound to rule out a bile leak. If the ultrasound is negative with normal labs he recommended discharge home and follow-up in the clinic tomorrow. In the meantime we'll treat his pain with IV sent no, Zofran, and IV fluids.     _________________________ 5:32 PM on 04/21/2017 ----------------------------------------- Ultrasound showing no complications postoperative. Discussed the findings with Dr. Burt Knack who recommended outpatient follow-up. Patient has just received fentanyl and zofran and IVF are running. Will reassess after IVF. Plan for DC home.    As part of my medical decision making, I reviewed the  following data within the [place] notes reviewed and incorporated, Labs reviewed , Old chart reviewed, Radiograph reviewed , A consult was requested and obtained from this/these consultant(s) surgery, Notes from prior ED visits and [place] Controlled Substance Database    Pertinent labs & imaging results that were available during my care of the patient were reviewed by me and considered in my medical decision making (see chart for details).    ____________________________________________   FINAL CLINICAL IMPRESSION(S) / ED DIAGNOSES  Final diagnoses:  RUQ abdominal pain      NEW MEDICATIONS STARTED DURING THIS VISIT:  ED Discharge Orders    None       Note:  This document was prepared using Dragon voice recognition software and may include unintentional dictation errors.    Alfred Levins, [place], MD 04/21/17 [phone]

## 2017-04-22 ENCOUNTER — Other Ambulatory Visit: Payer: Self-pay

## 2017-04-23 ENCOUNTER — Encounter: Payer: Self-pay | Admitting: Neurology

## 2017-04-24 ENCOUNTER — Ambulatory Visit (INDEPENDENT_AMBULATORY_CARE_PROVIDER_SITE_OTHER): Payer: 59 | Admitting: Surgery

## 2017-04-24 ENCOUNTER — Encounter: Payer: Self-pay | Admitting: Surgery

## 2017-04-24 ENCOUNTER — Telehealth: Payer: Self-pay | Admitting: Neurology

## 2017-04-24 VITALS — BP 163/95 | HR 91 | Temp 98.3°F | Wt 273.0 lb

## 2017-04-24 DIAGNOSIS — Z09 Encounter for follow-up examination after completed treatment for conditions other than malignant neoplasm: Secondary | ICD-10-CM

## 2017-04-24 NOTE — Progress Notes (Signed)
Outpatient postop visit  04/24/2017  Thomas Cole is an 47 y.o. male.    Procedure: Laparoscopic cholecystectomy for gangrenous cholecystitis  CC: Epigastric abdominal pain  HPI: This patient called over the weekend and was seen in the ER by the emergency room physicians who found no obvious cause for the patient's pain.  He had been having the pain for several weeks.  He had no nausea vomiting fevers or chills.  This was all relayed to me by the emergency room physician.  I did not see the patient when he was in the ED.  He did have an ultrasound of the right upper quadrant and labs which were normal.  Wife describes some redness along the midline of the abdomen over the weekend that has completely resolved.  Patient describes no nausea vomiting fevers or chills but he does have abdominal pain mostly in the epigastrium radiating along the course of the 2 lateral incisions.  This would correspond to the area right over his gangrenous cholecystitis.  Medications reviewed.    Physical Exam:  There were no vitals taken for this visit.    PE: No icterus no jaundice awake alert oriented no fevers Elevated blood pressure Abdomen is soft nondistended nontympanitic no erythema no drainage well-healed wounds and only minimal tenderness in the right upper quadrant. Nontender calves    Assessment/Plan:  Abdominal pain following cholecystectomy for gangrenous cholecystitis.  Ultrasound showed no fluid collections and labs were normal with normal LFTs and white blood cell count no suggestion of a biliary tract complication.  Pathology is reviewed showing necrosis of the mucosa and severe acute cholecystitis.  I think that because of the patient's severe acute cholecystitis with necrosis he is experiencing pain associated with that and pain associated with the intense scar tissue that must be developing in the area of the gallbladder.  There is no way to prove this but with the lack of findings  on ultrasound or on labs I would not rectum testing.  If this does not resolve then CT scan could be performed but all in all he is doing better and the ultrasound is more accurate.  His wife had laparoscopic surgery as an outpatient.  He is comparing his course to her course.  We reviewed all of those potentials and I reminded him that his gallbladder was not elective and was frankly gangrenous.  His postoperative course is expected to be different than his wife's was which was elective.  Patient cannot go back to work yet we will extend his time off and see him next week if necessary.  Florene Glen, MD, FACS

## 2017-04-24 NOTE — Telephone Encounter (Signed)
Message below relayed via MyChart.  

## 2017-04-24 NOTE — Telephone Encounter (Signed)
Patient wants the MRI results plus he has a copy of the DVD of the MRI. He wanted to know if you would need that DVD please call

## 2017-04-24 NOTE — Patient Instructions (Signed)
Please start drinking Boost or Ensure for you to get nutrients.  At this time you are not able to go back to work. We have extended your time off until 05/13/2017.

## 2017-04-24 NOTE — Telephone Encounter (Signed)
Pls let him know the MRI brain did not show any evidence of tumor, stroke, or bleed. Overall looked good. Would proceed with prolonged EEG if he is still having the symptoms we had discussed. Thanks

## 2017-04-29 ENCOUNTER — Ambulatory Visit (INDEPENDENT_AMBULATORY_CARE_PROVIDER_SITE_OTHER): Payer: 59 | Admitting: Neurology

## 2017-04-29 ENCOUNTER — Other Ambulatory Visit: Payer: Self-pay

## 2017-04-29 ENCOUNTER — Ambulatory Visit: Payer: 59 | Admitting: Neurology

## 2017-04-29 ENCOUNTER — Other Ambulatory Visit (INDEPENDENT_AMBULATORY_CARE_PROVIDER_SITE_OTHER): Payer: 59

## 2017-04-29 VITALS — BP 146/92 | HR 83 | Ht 74.0 in | Wt 276.0 lb

## 2017-04-29 DIAGNOSIS — Z79899 Other long term (current) drug therapy: Secondary | ICD-10-CM

## 2017-04-29 DIAGNOSIS — R29898 Other symptoms and signs involving the musculoskeletal system: Secondary | ICD-10-CM

## 2017-04-29 DIAGNOSIS — R292 Abnormal reflex: Secondary | ICD-10-CM

## 2017-04-29 LAB — C-REACTIVE PROTEIN: CRP: 0.3 mg/dL — AB (ref 0.5–20.0)

## 2017-04-29 LAB — SEDIMENTATION RATE: SED RATE: 2 mm/h (ref 0–15)

## 2017-04-29 LAB — CK: CK TOTAL: 97 U/L (ref 7–232)

## 2017-04-29 MED ORDER — DIAZEPAM 5 MG PO TABS: ORAL_TABLET | ORAL | 0 refills | Status: DC
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
## 2017-05-01 ENCOUNTER — Telehealth: Payer: Self-pay

## 2017-05-01 LAB — PHENYTOIN LEVEL, TOTAL: Phenytoin, Total: 14.8 mg/L (ref 10.0–20.0)

## 2017-05-01 LAB — ALDOLASE: Aldolase: 4.2 U/L (ref <=8.1)

## 2017-05-01 NOTE — Telephone Encounter (Signed)
Message below relayed via MyChart.  

## 2017-05-01 NOTE — Telephone Encounter (Signed)
-----   Message from Cameron Sprang, MD sent at 05/01/2017  9:29 AM EST ----- Pls let him know the bloodwork does not show any evidence of inflammation or muscle breakdown. Proceed with MRIs, thanks

## 2017-05-03 ENCOUNTER — Telehealth: Payer: Self-pay

## 2017-05-03 ENCOUNTER — Telehealth: Payer: Self-pay | Admitting: Surgery

## 2017-05-03 NOTE — Telephone Encounter (Signed)
Called patient and had to leave him a voicemail to return my call. 

## 2017-05-03 NOTE — Telephone Encounter (Signed)
Patients calling asking for a release back to work note, patient said he's feeling much better than the last time he was here, patient is due to come in on Monday 2/4 to see Dr. Burt Knack but is wanting to cancel due to him feeling better.please call patient and advise.

## 2017-05-06 ENCOUNTER — Encounter: Payer: Self-pay | Admitting: Surgery

## 2017-05-06 ENCOUNTER — Ambulatory Visit (INDEPENDENT_AMBULATORY_CARE_PROVIDER_SITE_OTHER): Payer: 59 | Admitting: Surgery

## 2017-05-06 ENCOUNTER — Encounter: Payer: Self-pay | Admitting: Neurology

## 2017-05-06 VITALS — BP 153/101 | HR 85 | Temp 98.4°F | Ht 74.0 in | Wt 275.2 lb

## 2017-05-06 DIAGNOSIS — Z09 Encounter for follow-up examination after completed treatment for conditions other than malignant neoplasm: Secondary | ICD-10-CM

## 2017-05-06 NOTE — Patient Instructions (Signed)
GENERAL POST-OPERATIVE PATIENT INSTRUCTIONS   WOUND CARE INSTRUCTIONS:  Keep a dry clean dressing on the wound if there is drainage. The initial bandage may be removed after 24 hours.  Once the wound has quit draining you may leave it open to air.  If clothing rubs against the wound or causes irritation and the wound is not draining you may cover it with a dry dressing during the daytime.  Try to keep the wound dry and avoid ointments on the wound unless directed to do so.  If the wound becomes bright red and painful or starts to drain infected material that is not clear, please contact your physician immediately.  If the wound is mildly pink and has a thick firm ridge underneath it, this is normal, and is referred to as a healing ridge.  This will resolve over the next 4-6 weeks.  BATHING: You may shower if you have been informed of this by your surgeon. However, Please do not submerge in a tub, hot tub, or pool until incisions are completely sealed or have been told by your surgeon that you may do so.  DIET:  You may eat any foods that you can tolerate.  It is a good idea to eat a high fiber diet and take in plenty of fluids to prevent constipation.  If you do become constipated you may want to take a mild laxative or take ducolax tablets on a daily basis until your bowel habits are regular.  Constipation can be very uncomfortable, along with straining, after recent surgery.  ACTIVITY:  You are encouraged to cough and deep breath or use your incentive spirometer if you were given one, every 15-30 minutes when awake.  This will help prevent respiratory complications and low grade fevers post-operatively if you had a general anesthetic.  You may want to hug a pillow when coughing and sneezing to add additional support to the surgical area, if you had abdominal or chest surgery, which will decrease pain during these times.  You are encouraged to walk and engage in light activity for the next two weeks.  You  should not lift more than 20 pounds, until 05/10/2017 as it could put you at increased risk for complications.  Twenty pounds is roughly equivalent to a plastic bag of groceries. At that time- Listen to your body when lifting, if you have pain when lifting, stop and then try again in a few days. Soreness after doing exercises or activities of daily living is normal as you get back in to your normal routine.  MEDICATIONS:  Try to take narcotic medications and anti-inflammatory medications, such as tylenol, ibuprofen, naprosyn, etc., with food.  This will minimize stomach upset from the medication.  Should you develop nausea and vomiting from the pain medication, or develop a rash, please discontinue the medication and contact your physician.  You should not drive, make important decisions, or operate machinery when taking narcotic pain medication.  SUNBLOCK Use sun block to incision area over the next year if this area will be exposed to sun. This helps decrease scarring and will allow you avoid a permanent darkened area over your incision.  QUESTIONS:  Please feel free to call our office if you have any questions, and we will be glad to assist you. [phone]    

## 2017-05-06 NOTE — Telephone Encounter (Signed)
Called patient and left him a voicemail again. However, patient will be seen today at 1:30 PM.

## 2017-05-06 NOTE — Progress Notes (Signed)
Outpatient postop visit  05/06/2017  Thomas Cole is an 47 y.o. male.    Procedure: Lap chole  CC: Feels better  HPI: Feels better overall still has some minimal epigastric pain.  Tolerating diet feeling better overall  Medications reviewed.    Physical Exam:  There were no vitals taken for this visit.    PE: No icterus no jaundice abdomen is soft nontender wounds are clean without erythema or drainage    Assessment/Plan:  Patient doing very well recommend follow-up on an as-needed basis reminded of no heavy lifting for a total of 4 weeks from the time of surgery  Florene Glen, MD, FACS

## 2017-05-07 NOTE — Telephone Encounter (Signed)
error 

## 2017-05-07 NOTE — Telephone Encounter (Signed)
Error

## 2017-05-08 ENCOUNTER — Encounter: Payer: Self-pay | Admitting: Surgery

## 2017-05-13 ENCOUNTER — Encounter: Payer: Self-pay | Admitting: Neurology

## 2017-05-13 DIAGNOSIS — M5124 Other intervertebral disc displacement, thoracic region: Secondary | ICD-10-CM | POA: Diagnosis not present

## 2017-05-13 DIAGNOSIS — G9589 Other specified diseases of spinal cord: Secondary | ICD-10-CM | POA: Diagnosis not present

## 2017-05-13 DIAGNOSIS — M5031 Other cervical disc degeneration,  high cervical region: Secondary | ICD-10-CM | POA: Diagnosis not present

## 2017-05-13 DIAGNOSIS — M47814 Spondylosis without myelopathy or radiculopathy, thoracic region: Secondary | ICD-10-CM | POA: Diagnosis not present

## 2017-05-13 DIAGNOSIS — M47812 Spondylosis without myelopathy or radiculopathy, cervical region: Secondary | ICD-10-CM | POA: Diagnosis not present

## 2017-05-15 ENCOUNTER — Telehealth: Payer: Self-pay

## 2017-05-15 NOTE — Telephone Encounter (Signed)
Message below relayed via MyChart.  

## 2017-05-15 NOTE — Telephone Encounter (Signed)
-----   Message from Cameron Sprang, MD sent at 05/14/2017  4:07 PM EST ----- Regarding: MRI Pls let him know I reviewed MRI report, there are arthritis changes at several levels but these do not significantly press on the spinal cord. The significant changes on his last MRI in 2013 at the level of C4-5 seems similar and does not seem to have changed, but if his symptoms are worsening, would recommend that he contact Dr. Ellene Route his neurosurgeon so he can take a look, and we will send MRI reports to him as well. In the meantime, we can start a medication to help stabilize the nerve irritation, does he want to take gabapentin?

## 2017-05-16 DIAGNOSIS — M5414 Radiculopathy, thoracic region: Secondary | ICD-10-CM | POA: Diagnosis not present

## 2017-05-16 DIAGNOSIS — Z981 Arthrodesis status: Secondary | ICD-10-CM | POA: Insufficient documentation

## 2017-05-17 DIAGNOSIS — Z4789 Encounter for other orthopedic aftercare: Secondary | ICD-10-CM | POA: Diagnosis not present

## 2017-05-17 DIAGNOSIS — Z981 Arthrodesis status: Secondary | ICD-10-CM | POA: Diagnosis not present

## 2017-05-24 DIAGNOSIS — M546 Pain in thoracic spine: Secondary | ICD-10-CM | POA: Diagnosis not present

## 2017-05-24 DIAGNOSIS — M4804 Spinal stenosis, thoracic region: Secondary | ICD-10-CM | POA: Diagnosis not present

## 2017-05-28 DIAGNOSIS — Z981 Arthrodesis status: Secondary | ICD-10-CM | POA: Diagnosis not present

## 2017-05-28 DIAGNOSIS — R29898 Other symptoms and signs involving the musculoskeletal system: Secondary | ICD-10-CM | POA: Diagnosis not present

## 2017-05-30 DIAGNOSIS — M5000 Cervical disc disorder with myelopathy, unspecified cervical region: Secondary | ICD-10-CM | POA: Diagnosis not present

## 2017-05-30 DIAGNOSIS — G9589 Other specified diseases of spinal cord: Secondary | ICD-10-CM | POA: Diagnosis not present

## 2017-06-11 DIAGNOSIS — G9589 Other specified diseases of spinal cord: Secondary | ICD-10-CM | POA: Diagnosis not present

## 2017-06-13 DIAGNOSIS — M5000 Cervical disc disorder with myelopathy, unspecified cervical region: Secondary | ICD-10-CM | POA: Insufficient documentation

## 2017-07-01 DIAGNOSIS — M5000 Cervical disc disorder with myelopathy, unspecified cervical region: Secondary | ICD-10-CM | POA: Diagnosis not present

## 2017-07-17 DIAGNOSIS — Z79899 Other long term (current) drug therapy: Secondary | ICD-10-CM | POA: Diagnosis not present

## 2017-07-17 DIAGNOSIS — E538 Deficiency of other specified B group vitamins: Secondary | ICD-10-CM | POA: Diagnosis not present

## 2017-07-17 DIAGNOSIS — M5000 Cervical disc disorder with myelopathy, unspecified cervical region: Secondary | ICD-10-CM | POA: Diagnosis not present

## 2017-07-17 DIAGNOSIS — E559 Vitamin D deficiency, unspecified: Secondary | ICD-10-CM | POA: Diagnosis not present

## 2017-07-17 DIAGNOSIS — R413 Other amnesia: Secondary | ICD-10-CM | POA: Diagnosis not present

## 2017-08-12 DIAGNOSIS — G9589 Other specified diseases of spinal cord: Secondary | ICD-10-CM | POA: Diagnosis not present

## 2017-08-12 DIAGNOSIS — R1310 Dysphagia, unspecified: Secondary | ICD-10-CM | POA: Diagnosis not present

## 2017-09-06 DIAGNOSIS — M545 Low back pain: Secondary | ICD-10-CM | POA: Diagnosis not present

## 2017-09-06 DIAGNOSIS — R0602 Shortness of breath: Secondary | ICD-10-CM | POA: Diagnosis not present

## 2017-09-06 DIAGNOSIS — R079 Chest pain, unspecified: Secondary | ICD-10-CM | POA: Diagnosis not present

## 2017-10-21 DIAGNOSIS — E559 Vitamin D deficiency, unspecified: Secondary | ICD-10-CM | POA: Diagnosis not present

## 2017-10-21 DIAGNOSIS — R413 Other amnesia: Secondary | ICD-10-CM | POA: Insufficient documentation

## 2017-10-21 DIAGNOSIS — M5000 Cervical disc disorder with myelopathy, unspecified cervical region: Secondary | ICD-10-CM | POA: Diagnosis not present

## 2017-10-21 DIAGNOSIS — R569 Unspecified convulsions: Secondary | ICD-10-CM | POA: Diagnosis not present

## 2017-10-21 DIAGNOSIS — E538 Deficiency of other specified B group vitamins: Secondary | ICD-10-CM | POA: Insufficient documentation

## 2017-10-28 DIAGNOSIS — R1314 Dysphagia, pharyngoesophageal phase: Secondary | ICD-10-CM | POA: Diagnosis not present

## 2017-10-30 ENCOUNTER — Other Ambulatory Visit: Payer: Self-pay | Admitting: Gastroenterology

## 2017-10-30 DIAGNOSIS — R1314 Dysphagia, pharyngoesophageal phase: Secondary | ICD-10-CM

## 2017-11-04 ENCOUNTER — Ambulatory Visit
Admission: RE | Admit: 2017-11-04 | Discharge: 2017-11-04 | Disposition: A | Discharge status: Home / Self Care | Payer: 59 | Source: Ambulatory Visit | Attending: Gastroenterology | Admitting: Gastroenterology

## 2017-11-04 DIAGNOSIS — K219 Gastro-esophageal reflux disease without esophagitis: Secondary | ICD-10-CM | POA: Insufficient documentation

## 2017-11-04 DIAGNOSIS — R1314 Dysphagia, pharyngoesophageal phase: Secondary | ICD-10-CM | POA: Diagnosis not present

## 2017-11-08 DIAGNOSIS — G20 Parkinson's disease: Secondary | ICD-10-CM | POA: Diagnosis not present

## 2017-11-08 DIAGNOSIS — M5000 Cervical disc disorder with myelopathy, unspecified cervical region: Secondary | ICD-10-CM | POA: Diagnosis not present

## 2017-11-08 DIAGNOSIS — R1310 Dysphagia, unspecified: Secondary | ICD-10-CM | POA: Diagnosis not present

## 2017-12-16 DIAGNOSIS — Z981 Arthrodesis status: Secondary | ICD-10-CM | POA: Diagnosis not present

## 2017-12-21 DIAGNOSIS — G4733 Obstructive sleep apnea (adult) (pediatric): Secondary | ICD-10-CM | POA: Diagnosis not present

## 2018-01-02 DIAGNOSIS — G4733 Obstructive sleep apnea (adult) (pediatric): Secondary | ICD-10-CM | POA: Diagnosis not present

## 2018-01-21 DIAGNOSIS — M5000 Cervical disc disorder with myelopathy, unspecified cervical region: Secondary | ICD-10-CM | POA: Diagnosis not present

## 2018-01-21 DIAGNOSIS — R413 Other amnesia: Secondary | ICD-10-CM | POA: Diagnosis not present

## 2018-01-21 DIAGNOSIS — M5481 Occipital neuralgia: Secondary | ICD-10-CM | POA: Diagnosis not present

## 2018-02-02 DIAGNOSIS — G4733 Obstructive sleep apnea (adult) (pediatric): Secondary | ICD-10-CM | POA: Diagnosis not present

## 2018-02-10 DIAGNOSIS — M5481 Occipital neuralgia: Secondary | ICD-10-CM | POA: Diagnosis not present

## 2018-02-10 DIAGNOSIS — M5000 Cervical disc disorder with myelopathy, unspecified cervical region: Secondary | ICD-10-CM | POA: Diagnosis not present

## 2018-02-11 DIAGNOSIS — Z0000 Encounter for general adult medical examination without abnormal findings: Secondary | ICD-10-CM | POA: Diagnosis not present

## 2018-02-11 DIAGNOSIS — M5481 Occipital neuralgia: Secondary | ICD-10-CM | POA: Insufficient documentation

## 2018-02-19 ENCOUNTER — Other Ambulatory Visit: Payer: Self-pay | Admitting: Neurology

## 2018-02-19 DIAGNOSIS — G40209 Localization-related (focal) (partial) symptomatic epilepsy and epileptic syndromes with complex partial seizures, not intractable, without status epilepticus: Secondary | ICD-10-CM

## 2018-03-04 DIAGNOSIS — G4733 Obstructive sleep apnea (adult) (pediatric): Secondary | ICD-10-CM | POA: Diagnosis not present

## 2018-05-13 DIAGNOSIS — M5412 Radiculopathy, cervical region: Secondary | ICD-10-CM | POA: Insufficient documentation

## 2018-05-13 DIAGNOSIS — G959 Disease of spinal cord, unspecified: Secondary | ICD-10-CM | POA: Insufficient documentation

## 2018-05-13 DIAGNOSIS — M4712 Other spondylosis with myelopathy, cervical region: Secondary | ICD-10-CM | POA: Insufficient documentation

## 2018-07-30 NOTE — Progress Notes (Signed)
Patient's Name: Thomas Cole  MRN: [id number]  Referring Provider: Vladimir Crofts, MD  DOB: 1970/06/23  PCP: Derinda Late, MD  DOS: 08/04/2018  Note by: Thomas Santa, MD  Service setting: Ambulatory outpatient  Specialty: Interventional Pain Management  Location: [facility] Virtual Visit  Visit type: Initial Patient Evaluation  Patient type: New Patient   Pain Management Virtual Encounter Note - Virtual Visit via [place] (real-time audio visits between healthcare provider and patient).  Patient's Phone No.:  [phone] (home); [phone] (mobile); (Preferred) [phone] [email or web address]  [id number] [address] [place] [id number]: [phone]: [phone]  Pre-screening note:  Our staff contacted Thomas Cole "in person", "face-to-face" appointment versus a telephone encounter. He indicated preferring the telephone encounter, at this time.  Primary Reason(s) for Visit: Tele-Encounter for initial evaluation of one or more chronic problems (new to examiner) potentially causing chronic pain, and posing a threat to normal musculoskeletal function. (Level of risk: High)  CC: Neck pain, shoulder pain, headaches  I contacted Thomas Cole 08/04/2018 at 12:26 PM via video conference and clearly identified myself as Thomas Santa MD. I verified that I was speaking with the correct person using two identifiers (Name and date of birth: [id number].  Advanced Informed Consent I sought verbal advanced consent from Thomas Cole virtual visit interactions. I informed Mr. WGarryof possible security and privacy concerns, risks, and limitations associated with providing "not-in-person" medical evaluation and management services. I also informed Thomas Cole the availability of "in-person" Cole. Finally, I informed him that there would be a charge for the virtual  visit and that he could be  personally, fully or partially, financially responsible for it. [name] understanding and agreed to proceed.   HPI  [name] a 48y.o. year old, male patient, contacted today for Cole initial evaluation of his chronic pain. He has Sleep apnea; Spondylosis, cervical, with myelopathy; Labile hypertension; Obesity (BMI 30-39.9); Migraines; Asthma, mild intermittent; History of seizures; GERD without esophagitis; Depression; SOB (shortness of breath); PTSD (post-traumatic stress disorder); Generalized anxiety disorder; Major depressive disorder, recurrent episode, severe (HHansen; Localization-related symptomatic epilepsy and epileptic syndromes with complex partial seizures, not intractable, without status epilepticus ([place]; Acute cholecystitis; Anxiety; Benign essential hypertension; S/P appendectomy; Seizure ([place]; H/O cervical spinal arthrodesis; Cervical myelopathy (HLuna; Bilateral occipital neuralgia; Degeneration of cervical intervertebral disc with myelopathy; and Thoracic radiculopathy on their problem list.  Pain Assessment: Location:    Neck, shoulder, base of skull Radiating:  Yes does radiate to left upper extremity and right upper extremity, left greater than right. Onset:  2011 after accident where he fell on ice and sustained cervical spinal cord injury resulting in cervical spinal fusion in 2011 Duration:  Present all day Quality:  Sharp, shooting, distressing, disabling, nagging, throbbing, tingling Severity: 9 /10 (subjective, self-reported pain score)  Effect on ADL:  Limits ability to sleep and engage in meaningful social activities, limited by pain Timing:  Usually worse in the evening or with neck twisting Modifying factors:   Improved with medications, rest, lying down, heat  Onset and Duration: Gradual and Present longer than 3 months Cause of pain: former pEngineer, structural falls Severity: Getting worse, NAS-11 at its worse: 10/10, NAS-11  at its best: 7/10, NAS-11 now: 9/10 and NAS-11 on the average: 8/10 Timing: Afternoon, Evening, Night and Not influenced by the time of the day Aggravating Factors: Bending,  Climbing, Kneeling, Lifiting, Motion, Prolonged sitting, Prolonged standing, Squatting, Stooping , Surgery made it worse, Twisting, Walking, Walking uphill and Walking downhill Alleviating Factors: denies Associated Problems: Night-time cramps, Nausea, Numbness, Spasms, Swelling, Tingling, Weakness, Pain that wakes patient up and Pain that does not allow patient to sleep Quality of Pain: Aching, Agonizing, Annoying, Burning, Constant, Cramping, Deep, Disabling, Dull, Itching, Sharp, Shooting, Throbbing and Tingling Previous Examinations or Tests: MRI scan, X-rays, Neurosurgical evaluation, Orthopedic evaluation and Chiropractic evaluation Previous Treatments: Facet blocks and The patient denies patient states injections did not help  Patient is a pleasant 48 year old male who endorses a chief complaint of neck, shoulder pain as well as persistent headaches.  Of note, patient sustained a traumatic injury in 2011 well and he fell on ice.  This resulted in cervical spine injury resulting in a cervical spinal fusion from C4-C6 at that time.  Afterwards, the patient continued to have persistent pain and occasional weakness of his arms and a second fusion was performed in 2013 from C3-C7.  Patient states that this surgery worsened his pain and and his functional status.  He also endorses persistent headaches that occur almost daily starting from the back of his skull and extends superiorly.  Patient states that he has tried trigger point injections with Dr. Manuella Ghazi which were not effective.  He is currently on Cymbalta 60 mg daily, nortriptyline 10 mg.  Patient did find benefit with Lyrica in the past but providers at that time thought the medication was contributing to labile blood pressures and discontinued however the patient states that he  continues to have intermittent blood pressure issues even when he is not on this medication.  He states that he did obtain pain relief while he was on Lyrica.  Patient also takes tizanidine at night which he states is somewhat helpful for his cervical spasms. Patient is not on any opioid medications  The patient was informed that my practice is divided into two sections: Cole interventional pain management section, as well as a completely separate and distinct medication management section. I explained that I have procedure days for my interventional therapies, and evaluation days for follow-ups and medication management. Because of the amount of documentation required during both, they are kept separated. This means that there is the possibility that he may be scheduled for a procedure on one day, and medication management the next. I have also informed him that because of staffing and facility limitations, I no longer take patients for medication management only. To illustrate the reasons for this, I gave the patient the example of surgeons, and how inappropriate it would be to refer a patient to his/her care, just to write for the post-surgical antibiotics on a surgery done by a different surgeon.   Because interventional pain management is my board-certified specialty, the patient was informed that joining my practice means that they are open to any and all interventional therapies. I made it clear that this does not mean that they will be forced to have any procedures done. What this means is that I believe interventional therapies to be essential part of the diagnosis and proper management of chronic pain conditions. Therefore, patients not interested in these interventional alternatives will be better served under the care of a different practitioner.  The patient was also made aware of my Comprehensive Pain Management Safety Guidelines where by joining my practice, they limit all of their nerve blocks  and joint injections to those done by our practice, for as long  as we are retained to manage their care.   Historic Controlled Substance Pharmacotherapy Review   04/29/2017  2   04/29/2017  Diazepam 5 MG Tablet  2.00 1 Ka Aqu   [id number]   Nor ([id number])   0  1.00 LME  Comm Ins   [place]     PMP reviewed.  Undesirable effects: Side-effects or Adverse reactions: None reported Historical Monitoring: The patient  reports no history of drug use. List of all UDS Test(s): No results found for: MDMA, COCAINSCRNUR, [place], [place], CANNABQUANT, THCU, [place] List of other Serum/Urine Drug Screening Test(s):  No results found for: AMPHSCRSER, BARBSCRSER, BENZOSCRSER, COCAINSCRSER, COCAINSCRNUR, PCPSCRSER, PCPQUANT, THCSCRSER, THCU, CANNABQUANT, OPIATESCRSER, OXYSCRSER, PROPOXSCRSER, ETH Historical Background Evaluation: [place] PMP: PDMP reviewed during this encounter. Six (6) year initial data search conducted.             [place] Department of public safety, offender search: Editor, commissioning Information) Non-contributory Risk Assessment Profile: Aberrant behavior: None observed or detected today Risk factors for fatal opioid overdose: age 59-6 years old Fatal overdose hazard ratio (HR): Calculation deferred Non-fatal overdose hazard ratio (HR): Calculation deferred Risk of opioid abuse or dependence: 0.7-3.0% with doses ? 36 MME/day and 6.1-26% with doses ? 120 MME/day. Substance use disorder (SUD) risk level: See below Personal History of Substance Abuse (SUD-Substance use disorder):  Alcohol: Negative  Illegal Drugs: Negative  Rx Drugs: Negative  ORT Risk Level calculation: Low Risk Opioid Risk Tool - 08/04/18 0853      Family History of Substance Abuse   Alcohol  Negative    Illegal Drugs  Negative    Rx Drugs  Negative      Personal History of Substance Abuse   Alcohol  Negative    Illegal Drugs  Negative    Rx Drugs  Negative      Age   Age between 74-45 years   No      History of Preadolescent  Sexual Abuse   History of Preadolescent Sexual Abuse  Negative or Male      Psychological Disease   Psychological Disease  Positive    Depression  Positive      Total Score   Opioid Risk Tool Scoring  3    Opioid Risk Interpretation  Low Risk      ORT Scoring interpretation table:  Score <3 = Low Risk for SUD  Score between 4-7 = Moderate Risk for SUD  Score >8 = High Risk for Opioid Abuse   Pharmacologic Plan: As per protocol, I have not taken over any controlled substance management, pending the results of ordered tests and/or consults.            Initial impression: Pending review of available data and ordered tests.  Meds   Current Outpatient Medications:  .  omeprazole (PRILOSEC) 20 MG capsule, Take x 2 x 30 m before bfast (Patient taking differently: Take 20 mg by mouth 2 (two) times daily before a meal. Take x 2 x 30 m before bfast), Disp: , Rfl:  .  phenytoin (DILANTIN) 100 MG ER capsule, TAKE 2 CAPSULES BY MOUTH EVERY MORNING AND TAKE 3 CAPSULES BY MOUTH EVERY EVENING (Patient taking differently: Take 500 mg by mouth daily. TAKE 2 CAPSULES BY MOUTH EVERY MORNING AND TAKE 3 CAPSULES BY MOUTH EVERY EVENING), Disp: 450 capsule, Rfl: 3 .  polyethylene glycol powder (GLYCOLAX/MIRALAX) powder, Take 17 g by mouth 2 (two) times daily. Please take 17 g twice daily until you begin having multiple bowel  movements then you may discontinue use of MiraLAX., Disp: 255 g, Rfl: 0 .  carbidopa-levodopa (SINEMET IR) 25-100 MG tablet, TAKE 2 TABLETS BY MOUTH 3 (THREE) TIMES DAILY, Disp: , Rfl:  .  Cholecalciferol (VITAMIN D3) 25 MCG (1000 UT) CAPS, Take by mouth., Disp: , Rfl:  .  diazepam (VALIUM) 5 MG tablet, , Disp: , Rfl:  .  DULoxetine (CYMBALTA) 60 MG capsule, TAKE 1 CAPSULE (60 MG TOTAL) BY MOUTH ONCE DAILY, Disp: , Rfl:  .  hydrochlorothiazide (HYDRODIURIL) 25 MG tablet, Take 25 mg by mouth daily., Disp: , Rfl:  .  losartan-hydrochlorothiazide (HYZAAR) 50-12.5 MG tablet, Take 1 tablet by  mouth daily. , Disp: , Rfl:  .  Melatonin 1 MG TABS, Take by mouth., Disp: , Rfl:  .  nortriptyline (PAMELOR) 10 MG capsule, TAKE ONE CAPSULE AT NIGHT FOR 2 WEEKS THEN 2 CAPSULES AT NIGHT THEREAFTER., Disp: , Rfl:  .  ondansetron (ZOFRAN) 8 MG tablet, , Disp: , Rfl:  .  phenytoin (DILANTIN) 100 MG ER capsule, Take by mouth., Disp: , Rfl:  .  pregabalin (LYRICA) 100 MG capsule, Take 1 capsule (100 mg total) by mouth at bedtime for 14 days, THEN 1 capsule (100 mg total) 2 (two) times daily for 14 days, THEN 1 capsule (100 mg total) 3 (three) times daily., Disp: 90 capsule, Rfl: 0 .  tiZANidine (ZANAFLEX) 4 MG tablet, Take 1 tablet (4 mg total) by mouth 2 (two) times daily as needed for muscle spasms., Disp: 60 tablet, Rfl: 2 .  valsartan (DIOVAN) 80 MG tablet, Take 80 mg by mouth daily., Disp: , Rfl:  .  vitamin B-12 (CYANOCOBALAMIN) 1000 MCG tablet, Take by mouth., Disp: , Rfl:   ROS  Cardiovascular: High blood pressure Pulmonary or Respiratory: Lung problems and Shortness of breath Neurological: Seizure disorder Review of Past Neurological Studies:  Results for orders placed or performed during the hospital encounter of 02/03/09  CT Head Wo Contrast   Narrative   Clinical Data: Syncope.  Frontal headache.  High blood pressure.   CT HEAD WITHOUT CONTRAST   Technique:  Contiguous axial images were obtained from the base of the skull through the vertex without contrast.   Comparison: None   Findings: Bone windows demonstrate mucosal thickening of ethmoid air cells. Clear mastoid air cells.   Soft tissue windows demonstrate no  mass lesion, hemorrhage, hydrocephalus, acute infarct, intra-axial, or extra-axial fluid collection.   IMPRESSION:   1. No acute intracranial abnormality. 2.  Mild sinus disease.  Provider: Jeanell Sparrow   Psychological-Psychiatric: Anxiousness and Depressed Gastrointestinal: Reflux or heatburn and Irregular, infrequent bowel movements  (Constipation) Genitourinary: No reported renal or genitourinary signs or symptoms such as difficulty voiding or producing urine, peeing blood, non-functioning kidney, kidney stones, difficulty emptying the bladder, difficulty controlling the flow of urine, or chronic kidney disease Hematological: No reported hematological signs or symptoms such as prolonged bleeding, low or poor functioning platelets, bruising or bleeding easily, hereditary bleeding problems, low energy levels due to low hemoglobin or being anemic Endocrine: No reported endocrine signs or symptoms such as high or low blood sugar, rapid heart rate due to high thyroid levels, obesity or weight gain due to slow thyroid or thyroid disease Rheumatologic: No reported rheumatological signs and symptoms such as fatigue, joint pain, tenderness, swelling, redness, heat, stiffness, decreased range of motion, with or without associated rash Musculoskeletal: Negative for myasthenia gravis, muscular dystrophy, multiple sclerosis or malignant hyperthermia Work History: Disabled  Allergies  Thomas Cole  is allergic to amitriptyline; bee venom; chlorhexidine; carbamazepine; meloxicam; morphine and related; and sulfa antibiotics.  Laboratory Chemistry  Inflammation Markers (CRP: Acute Phase) (ESR: Chronic Phase) Lab Results  Component Value Date   CRP 0.3 (L) 04/29/2017   ESRSEDRATE 2 04/29/2017                         Rheumatology Markers No results found for: RF, ANA, LABURIC, URICUR, LYMEIGGIGMAB, LYMEABIGMQN, HLAB27                      Renal Function Markers Lab Results  Component Value Date   BUN 13 04/21/2017   CREATININE 0.93 04/21/2017   LABCREA 79.3 03/05/2014   GFRAA >60 04/21/2017   GFRNONAA >60 04/21/2017                             Hepatic Function Markers Lab Results  Component Value Date   AST 30 04/21/2017   ALT 23 04/21/2017   ALBUMIN 4.2 04/21/2017   ALKPHOS 86 04/21/2017   LIPASE 51 04/21/2017                         Electrolytes Lab Results  Component Value Date   NA 138 04/21/2017   K 3.7 04/21/2017   CL 101 04/21/2017   CALCIUM 9.4 04/21/2017                        Neuropathy Markers Lab Results  Component Value Date   VITAMINB12 420 11/11/2013   HIV Non Reactive 03/30/2017                        CNS Tests No results found for: COLORCSF, APPEARCSF, RBCCOUNTCSF, WBCCSF, POLYSCSF, LYMPHSCSF, EOSCSF, PROTEINCSF, GLUCCSF, JCVIRUS, CSFOLI, IGGCSF, LABACHR, ACETBL                      Bone Pathology Markers Lab Results  Component Value Date   VD25OH 31.95 03/05/2016                         Coagulation Parameters Lab Results  Component Value Date   PLT 172 04/21/2017                        Cardiovascular Markers Lab Results  Component Value Date   CKTOTAL 97 04/29/2017   TROPONINI <0.03 03/27/2017   HGB 15.0 04/21/2017   HCT 43.7 04/21/2017                         ID Markers Lab Results  Component Value Date   HIV Non Reactive 03/30/2017                        CA Markers No results found for: CEA, CA125, LABCA2                      Endocrine Markers Lab Results  Component Value Date   TSH 0.94 11/11/2013                        Note: Lab results reviewed.  Imaging Review  Cervical Imaging: Cervical MR wo contrast:  Results  for orders placed during the hospital encounter of 03/27/12  MR Cervical Spine Wo Contrast   Narrative *RADIOLOGY REPORT*  Clinical Data: Fusion 2 years ago.  Fell on ice 3 years ago with continued symptoms.  Posterior neck pain extending down back.  Pain radiates around ribs on the right.  Numbness left hand.  MRI CERVICAL SPINE WITHOUT CONTRAST  Technique:  Multiplanar and multiecho pulse sequences of the cervical spine, to include the craniocervical junction and cervicothoracic junction, were obtained according to standard protocol without intravenous contrast.  Comparison: Lateral views cervical spine 02/14/2010.  No  comparison cervical spine MR.  Findings: Cervical medullary junction unremarkable.  Both vertebral arteries are patent.  Visualized paravertebral structures unremarkable.  Probable 2 cm hemangioma T2 and vertebral body.  No worrisome osseous lesion.  Remote fusion C4-C6.  The cord is markedly thinned with increased signal suggestive of encephalomalacia most prominent at the C4-C5 level and greater on the left. It is possible this is explained by remote cord compression which was treated with surgery, persistent cord irritation secondary to residual cervical spondylotic changes (as detailed below) not excluded.  C2-3:  Mild bulge.  Minimal uncinate spur on the left.  No significant spinal stenosis or foraminal narrowing.  C3-4:  Broad-based disc protrusion greater to the left with cephalad and minimal caudad extension.  Spinal stenosis and cord flattening greater on the left.  Minimal increased signal within the cord may represent edema/gliosis.  Uncinate bony overgrowth with moderate to slightly marked bilateral foraminal narrowing.  C4-5:  Prior fusion.  Prominent left posterior lateral spur extends from the mid C4 level to the disc space compressing the left aspect of the cord.  Increased signal within the compressed cord may represent gliosis and / or edema.  Broad-based bony spur central and right posterior lateral position at the disc space level and just below the disc space with spinal stenosis and cord encroachment.  Minimal uncinate bony overgrowth.  Minimal foraminal narrowing greater on the left.  C5-6:  Prior fusion.  Minimal central bulge/spur.  C6:  Focal spur posterior central aspect of the vertebral body. Mild spinal stenosis.  C6-7:  Broad-based protrusion greater to the right.  Spinal stenosis greater on the right.  Impression upon the ventral nerve roots greater on the right.  Uncinate bony overgrowth greater on the right.  Marked right-sided with mild  to moderate left-sided foraminal narrowing.  C7-T1:  Mild facet joint degenerative changes.  Minimal uncinate hypertrophy.  T1-2:  Negative.  T2-3:   Right-sided posterior element hypertrophy with impression upon the right posterior lateral aspect of thecal sac.  Axial images not obtained.  Minimal bulge.  IMPRESSION: The cord is markedly thinned with increased signal suggestive of encephalomalacia most prominent at the C4-C5 level and greater on the left. It is possible this is explained by remote cord compression which was treated with surgery, persistent cord irritation secondary to residual cervical spondylotic changes (as detailed below) not excluded.  C3-4:  Broad-based disc protrusion greater to the left with cephalad and minimal caudad extension.  Spinal stenosis and cord flattening greater on the left.  Minimal increased signal within the cord may represent edema/gliosis.  Uncinate bony overgrowth with moderate to slightly marked bilateral foraminal narrowing.  C4-5:  Prior fusion.  Prominent left posterior lateral spur extends from the mid C4 level to the disc space compressing the left aspect of the cord.  Increased signal within the compressed cord may represent gliosis and / or edema.  Broad-based  bony spur central and right posterior lateral position at the disc space level and just below the disc space with spinal stenosis and cord encroachment.  Minimal uncinate bony overgrowth.  Minimal foraminal narrowing greater on the left.  C5-6:  Prior fusion.  Minimal central bulge/spur.  C6:  Focal spur posterior central aspect of the vertebral body. Mild spinal stenosis.  C6-7:  Broad-based protrusion greater to the right.  Spinal stenosis greater on the right.  Impression upon the ventral nerve roots greater on the right.  Uncinate bony overgrowth greater on the right.  Marked right-sided with mild to moderate left-sided foraminal narrowing.  T2-3:   Right-sided  posterior element hypertrophy with impression upon the right posterior lateral aspect of thecal sac.  Axial images not obtained.  This has been made a PRA call report utilizing dashboard call feature.   Original Report Authenticated By: Genia Del, M.D.    Cervical MR wo contrast: No procedure found. Cervical MR w/wo contrast: No results found for this or any previous visit. Cervical MR w contrast: No results found for this or any previous visit. Cervical CT wo contrast: No results found for this or any previous visit. Cervical CT w/wo contrast: No results found for this or any previous visit. Cervical CT w/wo contrast: No results found for this or any previous visit. Cervical CT w contrast: No results found for this or any previous visit. Cervical CT outside: No results found for this or any previous visit. Cervical DG 1 view:  Results for orders placed during the hospital encounter of 09/15/12  DG Cervical Spine 1 View   Narrative *RADIOLOGY REPORT*  Clinical Data: Cervical spine surgery  DG CERVICAL SPINE - 1 VIEW  Comparison: Portable exam 1600 hours compared to MRI cervical spine 03/27/2012  Findings: Portable cross-table lateral view #1 at 1600 hours: Anterior plate and screws identified at C3 to at least C6. Inferior extent of plate obscured by patient's shoulders. Examination is limited by the severe degree of osseous demineralization. Disc prostheses are noted at C4-C5 and C5-C6. Anterior support tubes identified. No gross evidence of subluxation.  IMPRESSION: Anterior cervical spine fusion from C3 through at least C6.   Original Report Authenticated By: Lavonia Dana, M.D.    Results for orders placed during the hospital encounter of 06/07/09  DG Cervical Spine 2-3 Views   Narrative Clinical Data: C4-5 and C5-6 ACDF   CERVICAL SPINE - 2-3 VIEW   Comparison: None   Findings: Three C-arm images are provided.  The initial film shows a probe anterior to  the C4-5 disc level.  The second film shows anterior cervical discectomy and fusion from C4-C6.  Interbody fusion material is in place.  There is Cole anterior plate with screw fixation.  The lower aspect is poorly seen because of shoulder density.   IMPRESSION: ACDF C4-C6  Provider: Burnett Sheng, Tyson Dense, [place] Souther    Thoracic Imaging: Thoracic MR wo contrast:  Results for orders placed during the hospital encounter of 07/30/12  MR Thoracic Spine Wo Contrast   Narrative *RADIOLOGY REPORT*  Clinical Data: Mid thoracic back pain.  Numbness in the left hand. Arm and leg weakness.  Thoracic spondylosis.  MRI THORACIC SPINE WITHOUT CONTRAST  Technique:  Multiplanar and multiecho pulse sequences of the thoracic spine were obtained without intravenous contrast.  Comparison: Multiple exams, including cervical MRI from 03/27/2012 and chest radiograph from 06/01/2009  Findings: Imaging performed from C7 through the mid T12 level. Please note that the T12-L1  disc was excluded.  A 1.3 cm hemangioma is present in the T2 vertebral body  There is abnormal edema signal anteriorly in the T10 vertebral body with slightly indistinct anterior cortex and low-level edema along the anterior spurring.  Low signal intensity suspicious for the possibility of the right anterior inferior spur fracture from the endplate of D47.  No significant abnormal cord signal is identified.  No significant vertebral subluxation.  Despite efforts by the patient and technologist, motion artifact is present on some series of today's examination and could not be totally eliminated.  This reduces diagnostic sensitivity and specificity.  Additional findings at individual levels are as follows:  T1-2:  Mild left foraminal stenosis due to facet arthropathy.  T2-3:  Borderline right foraminal stenosis due to right facet arthropathy.  Small left paracentral disc protrusion thins the ventral  subarachnoid space.  T3-4:  Mild central stenosis due to right paracentral disc protrusion.  T4-5:  Unremarkable.  T5-6:  Unremarkable.  T6-7:  Unremarkable.  T7-8:  No impingement.  Loss of disc height noted at this level with slight endplate irregularity.  Subtle disc bulge.  T8-9:  Unremarkable.  T9-10:  Unremarkable.  T10-11:  No impingement.  Mild disc bulge.  T11-12:  Unremarkable.  T12-L1:  Not included on today's exam.  IMPRESSION:  1.  Abnormal edema signal anteriorly in the T10 vertebral body, with prominent spurs anteriorly and poor definition of the anterior cortex, as well as subtle paravertebral edema.  I favor fracture of a spur in this vicinity with osteoid deposition.  I reviewed this finding with Dr. Lars Pinks, who concurs.  I do not observe significant loss of vertebral body height or significant endplate irregularity. If the patient experiences progressive pain or if there is otherwise unexplained fever/leukocytosis, then the less likely prospects of vertebral osteomyelitis or infiltrative malignancy would need to be considered more seriously and follow-up MRI might be warranted.  No observed abnormal epidural process in this vicinity.  2.  Low-level impingement at several levels:    T1-2:  Mild left foraminal stenosis due to facet arthropathy.    T2-3:  Borderline right foraminal stenosis due to right facet arthropathy.  Small left paracentral disc protrusion thins the ventral subarachnoid space.    T3-4:  Mild central stenosis due to right paracentral disc protrusion.   Original Report Authenticated By: Van Clines, M.D.     Ankle Imaging: Ankle-R DG Complete:  Results for orders placed in visit on 09/23/13  DG Ankle Complete Right   Narrative 5 use of the right foot and ankle demonstrates a normal ankle osseous  architecture with Cole osseously mature individual. Mortise appears to be  intact. Lateral view of the foot does  demonstrate some sclerotic margins  along the ankle as well as the subtalar joint. Mild hammertoe deformities  are noted on otherwise rectus foot.   Ankle-L DG Complete:  Results for orders placed in visit on 12/02/13  DG Ankle Complete Left   Narrative 3 views of his left ankle demonstrates Cole osseously mature foot and ankle  with a small spur to the medial talar this is adjacent to the medial  malleolus. It appears that he has some old injury to the anterior margin  of the tibia with dorsal lipping.     Complexity Note: Imaging results reviewed. Results shared with Mr. Schmader, using Layman's terms.  Cool  Drug: Thomas Cole  reports no history of drug use. Alcohol:  reports no history of alcohol use. Tobacco:  reports that he has never smoked. He has never used smokeless tobacco. Medical:  has a past medical history of Anxiety, Asthma, Back problem, Benign essential hypertension (11/30/2016), Cervical spondylosis without myelopathy (3/70/4888), Complication of anesthesia, Depression, GERD (gastroesophageal reflux disease), Hypertension, Labile hypertension (03/19/2013), Major depressive disorder, recurrent episode, severe ([place]) (06/23/2015), Migraine headache, Migraines (11/11/2013), S/P appendectomy (04/09/2011), Seizure ([place]) (11/30/2016), Seizures ([place]), Shortness of breath, and SOB (shortness of breath) (08/06/2014). Family: family history includes Arthritis in his father, maternal grandfather, maternal grandmother, mother, paternal grandfather, and paternal grandmother; Asthma in his mother; Coronary artery disease in his father and mother; Heart disease in his father, maternal grandfather, maternal grandmother, paternal grandfather, and paternal grandmother; Hypertension in his maternal grandfather, maternal grandmother, mother, paternal grandfather, and paternal grandmother; Mental illness in his mother.  Past Surgical History:  Procedure Laterality Date  . ANTERIOR  CERVICAL CORPECTOMY N/A 09/15/2012   Procedure: Cervical Three-Four,Cervical Six-Seven Anterior cervical decompression/diskectomy fusion with Cervical Four Corpectomy;  Surgeon: Kristeen Miss, MD;  Location: [facility];  Service: Neurosurgery;  Laterality: N/A;  Cervical Three-Four,Cervical Six-Seven  Anterior cervical decompression/diskectomy fusion with Cervical four Corpectomy  . APPENDECTOMY  12  . CERVICAL DISC SURGERY    . CHOLECYSTECTOMY N/A 03/29/2017   Procedure: LAPAROSCOPIC CHOLECYSTECTOMY;  Surgeon: Florene Glen, MD;  Location: [facility];  Service: General;  Laterality: N/A;  . KNEE SURGERY     bilateral  . POSTERIOR FUSION CERVICAL SPINE     Active Ambulatory Problems    Diagnosis Date Noted  . Sleep apnea 05/25/2010  . Spondylosis, cervical, with myelopathy 09/17/2012  . Labile hypertension 03/19/2013  . Obesity (BMI 30-39.9) 11/11/2013  . Migraines 11/11/2013  . Asthma, mild intermittent 11/11/2013  . History of seizures 11/11/2013  . GERD without esophagitis 11/11/2013  . Depression 11/11/2013  . SOB (shortness of breath) 08/06/2014  . PTSD (post-traumatic stress disorder) 06/16/2015  . Generalized anxiety disorder 06/23/2015  . Major depressive disorder, recurrent episode, severe ([place]) 06/23/2015  . 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 Acute cholecystitis 03/29/2017  . Anxiety 11/30/2016  . Benign essential hypertension 11/30/2016  . S/P appendectomy 04/09/2011  . Seizure ([place]) 11/30/2016  . H/O cervical spinal arthrodesis 05/16/2017  . Cervical myelopathy ([place]) 05/13/2018  . Bilateral occipital neuralgia 02/11/2018  . Degeneration of cervical intervertebral disc with myelopathy 06/13/2017  . Thoracic radiculopathy 05/16/2017   Resolved Ambulatory Problems    Diagnosis Date Noted  . CHEST PAIN 03/23/2010  . Neck pain 03/19/2013  .  Dyspnea 09/18/2014   Past Medical History:  Diagnosis Date  . Asthma   . Back problem   . Cervical spondylosis without myelopathy 09/17/2012  . Complication of anesthesia   . GERD (gastroesophageal reflux disease)   . Hypertension   . Migraine headache   . Seizures ([place])   . Shortness of breath    Assessment  Primary Diagnosis & Pertinent Problem List: The primary encounter diagnosis was H/O cervical spinal arthrodesis (C3-C7 fusion). Diagnoses of Failed cervical fusion, Myelopathy of cervical spinal cord with cervical radiculopathy, Thoracic radiculopathy, Spondylosis, cervical, with myelopathy, Episode of recurrent major depressive disorder, unspecified depression episode severity ([place]), PTSD (post-traumatic stress disorder), and Generalized anxiety disorder were also pertinent to this visit.  Visit Diagnosis (New problems to examiner): 1. H/O cervical spinal arthrodesis (  C3-C7 fusion)   2. Failed cervical fusion   3. Myelopathy of cervical spinal cord with cervical radiculopathy   4. Thoracic radiculopathy   5. Spondylosis, cervical, with myelopathy   6. Episode of recurrent major depressive disorder, unspecified depression episode severity ([place])   7. PTSD (post-traumatic stress disorder)   8. Generalized anxiety disorder   General Recommendations: The pain condition that the patient suffers from is best treated with a multidisciplinary approach that involves Cole increase in physical activity to prevent de-conditioning and worsening of the pain cycle, as well as psychological counseling (formal and/or informal) to address the co-morbid psychological affects of pain. Treatment will often involve judicious use of pain medications and interventional procedures to decrease the pain, allowing the patient to participate in the physical activity that will ultimately produce long-lasting pain reductions. The goal of the multidisciplinary approach is to return the patient to a higher level of  overall function and to restore their ability to perform activities of daily living.  Patient is a pleasant 48 year old male who endorses a chief complaint of neck, shoulder pain as well as persistent headaches.  Of note, patient sustained a traumatic injury in 2011 well and he fell on ice.  This resulted in cervical spine injury resulting in a cervical spinal fusion from C4-C6 at that time.  Afterwards, the patient continued to have persistent pain and occasional weakness of his arms and a second fusion was performed in 2013 from C3-C7.  Patient states that this surgery worsened his pain and and his functional status.  He also endorses persistent headaches that occur almost daily starting from the back of his skull and extends superiorly.  Patient states that he has tried trigger point injections with Dr. Manuella Ghazi which were not effective.  He is currently on Cymbalta 60 mg daily, nortriptyline 10 mg.  Patient did find benefit with Lyrica in the past but providers at that time thought the medication was contributing to labile blood pressures and discontinued however the patient states that he continues to have intermittent blood pressure issues even when he is not on this medication.  He states that he did obtain pain relief while he was on Lyrica.  Patient also takes tizanidine at night which he states is somewhat helpful for his cervical spasms. Patient is not on any opioid medications.  I had extensive discussion with the patient regarding treatment options.  Patient has tried physical therapy in the past which was not very helpful and actually worsened his headaches.  He is currently on Cymbalta 60 mg daily which I instructed him to continue.  I recommend that he restart Lyrica as below to help out with his neuropathic pain symptoms related to cervical myelopathy and cervical radiculopathy.  He has taken this medication in the past and he states it was effective.  I informed the patient of any side effects and  not to titrate up any further if he experiences sedation, drowsiness, lower extremity swelling, nausea that persist for more than 2 to 3 days.  I also instructed the patient to increase his tizanidine to twice daily as needed to help out with his cervicogenic headaches.  Patient has tried gabapentin in the past which resulted in side effects of headaches and actually worsening pain.  Patient continues nortriptyline 10 mg as well.  If the above measures are not effective, patient could be a candidate for low-dose opioid therapy such as low-dose hydrocodone or buprenorphine for pain management.  In regards to interventional pain management,  patient has tried cervical trigger point injections which he states were not very effective.  This could be repeated and we could focus on his trapezius region which he states is uncomfortable and bothersome at times.  We also discussed cervical and/or thoracic epidural steroid injection to help with his symptoms of cervical and thoracic radiculopathy.  Patient could also be a candidate for diagnostic cervical facet medial branch nerve blocks for cervical facet arthropathy.  However before ordering any of these, I would like to see the patient in person and conduct a physical exam which will further help guide management.   Pharmacotherapy (current): Medications ordered:  Meds ordered this encounter  Medications  . tiZANidine (ZANAFLEX) 4 MG tablet    Sig: Take 1 tablet (4 mg total) by mouth 2 (two) times daily as needed for muscle spasms.    Dispense:  60 tablet    Refill:  2  . pregabalin (LYRICA) 100 MG capsule    Sig: Take 1 capsule (100 mg total) by mouth at bedtime for 14 days, THEN 1 capsule (100 mg total) 2 (two) times daily for 14 days, THEN 1 capsule (100 mg total) 3 (three) times daily.    Dispense:  90 capsule    Refill:  0    Do not place this medication, or any other prescription from our practice, on "Automatic Refill". Patient may have prescription  filled one day early if pharmacy is closed on scheduled refill date.   Medications administered during this visit: Thomas Cole had no medications administered during this visit.   Pharmacological management options:  Opioid Analgesics: The patient was informed that there is no guarantee that he would be a candidate for opioid analgesics. The decision will be made following CDC guidelines. This decision will be based on the results of diagnostic studies, as well as Thomas Cole risk profile.  Will need psych assessment and could be candidate for buprenorphine or tramadol initially.  Membrane stabilizer: Side effects to gabapentin.  On Cymbalta 60 mg daily, continue and also continue nortriptyline 10 mg.  Will retry Lyrica.  Muscle relaxant: Tried and failed Flexeril.  Increase tizanidine to 4 mg twice daily as needed  NSAID: To be determined at a later time  Other analgesic(s): To be determined at a later time   Interventional management options: Thomas Cole was informed that there is no guarantee that he would be a candidate for interventional therapies. The decision will be based on the results of diagnostic studies, as well as Thomas Cole risk profile.  Procedure(s) under consideration:  Diagnostic cervical facet medial branch nerve blocks Diagnostic thoracic facet medial branch nerve blocks Cervical and/or thoracic epidural steroid injection Cervical/trapezius trigger point injections SPG block   Provider-requested follow-up: Return in about 8 weeks (around 09/29/2018) for Medication Management.  No future Cole.  Total duration of non-face-to-face encounter: 30 minutes.  Primary Care Physician: Derinda Late, MD Location: [facility] Note by: Thomas Santa, MD Date: 08/04/2018; Time: 12:26 PM

## 2018-07-31 ENCOUNTER — Telehealth: Payer: Self-pay | Admitting: *Deleted

## 2018-08-04 ENCOUNTER — Other Ambulatory Visit: Payer: Self-pay

## 2018-08-04 ENCOUNTER — Ambulatory Visit
Payer: Self-pay | Attending: Student in an Organized Health Care Education/Training Program | Admitting: Student in an Organized Health Care Education/Training Program

## 2018-08-04 DIAGNOSIS — G959 Disease of spinal cord, unspecified: Secondary | ICD-10-CM

## 2018-08-04 DIAGNOSIS — Z981 Arthrodesis status: Secondary | ICD-10-CM

## 2018-08-04 DIAGNOSIS — M5412 Radiculopathy, cervical region: Secondary | ICD-10-CM

## 2018-08-04 DIAGNOSIS — F4310 Post-traumatic stress disorder, unspecified: Secondary | ICD-10-CM

## 2018-08-04 DIAGNOSIS — F411 Generalized anxiety disorder: Secondary | ICD-10-CM

## 2018-08-04 DIAGNOSIS — F339 Major depressive disorder, recurrent, unspecified: Secondary | ICD-10-CM

## 2018-08-04 DIAGNOSIS — M4712 Other spondylosis with myelopathy, cervical region: Secondary | ICD-10-CM

## 2018-08-04 DIAGNOSIS — M960 Pseudarthrosis after fusion or arthrodesis: Secondary | ICD-10-CM

## 2018-08-04 DIAGNOSIS — M5414 Radiculopathy, thoracic region: Secondary | ICD-10-CM

## 2018-08-04 MED ORDER — PREGABALIN 100 MG PO CAPS: ORAL_CAPSULE | ORAL | 0 refills | Status: DC

## 2018-08-04 MED ORDER — TIZANIDINE HCL 4 MG PO TABS: 4.0000 mg | ORAL_TABLET | Freq: Two times a day (BID) | ORAL | 2 refills | Status: DC | PRN

## 2018-10-01 ENCOUNTER — Telehealth: Payer: Self-pay

## 2018-10-01 MED ORDER — PREGABALIN 100 MG PO CAPS: 100.0000 mg | ORAL_CAPSULE | Freq: Three times a day (TID) | ORAL | 2 refills | Status: DC

## 2018-10-01 NOTE — Telephone Encounter (Signed)
Dr Holley Raring, I changed the pharmacy in the system

## 2018-10-01 NOTE — Telephone Encounter (Signed)
Lyrica sent in  Requested Prescriptions   Signed Prescriptions Disp Refills  . pregabalin (LYRICA) 100 MG capsule 90 capsule 2    Sig: Take 1 capsule (100 mg total) by mouth 3 (three) times daily.    Authorizing Provider: Gillis Santa

## 2018-10-01 NOTE — Telephone Encounter (Signed)
He is out of Lyrica and has changed pharmacy.[facility] His new one is [facility]. He has a med refill appt on 7/13 but is out of lyrica now.

## 2018-10-06 DIAGNOSIS — R7982 Elevated C-reactive protein (CRP): Secondary | ICD-10-CM | POA: Insufficient documentation

## 2018-10-13 ENCOUNTER — Other Ambulatory Visit: Payer: Self-pay

## 2018-10-13 ENCOUNTER — Encounter: Payer: Self-pay | Admitting: Student in an Organized Health Care Education/Training Program

## 2018-10-13 ENCOUNTER — Ambulatory Visit
Payer: Self-pay | Attending: Student in an Organized Health Care Education/Training Program | Admitting: Student in an Organized Health Care Education/Training Program

## 2018-10-13 DIAGNOSIS — M5414 Radiculopathy, thoracic region: Secondary | ICD-10-CM

## 2018-10-13 DIAGNOSIS — G894 Chronic pain syndrome: Secondary | ICD-10-CM

## 2018-10-13 DIAGNOSIS — F339 Major depressive disorder, recurrent, unspecified: Secondary | ICD-10-CM

## 2018-10-13 DIAGNOSIS — F411 Generalized anxiety disorder: Secondary | ICD-10-CM

## 2018-10-13 DIAGNOSIS — M4712 Other spondylosis with myelopathy, cervical region: Secondary | ICD-10-CM

## 2018-10-13 DIAGNOSIS — G959 Disease of spinal cord, unspecified: Secondary | ICD-10-CM

## 2018-10-13 DIAGNOSIS — Z981 Arthrodesis status: Secondary | ICD-10-CM

## 2018-10-13 DIAGNOSIS — M960 Pseudarthrosis after fusion or arthrodesis: Secondary | ICD-10-CM

## 2018-10-13 DIAGNOSIS — F4310 Post-traumatic stress disorder, unspecified: Secondary | ICD-10-CM

## 2018-10-13 MED ORDER — PREGABALIN 100 MG PO CAPS: 100.0000 mg | ORAL_CAPSULE | Freq: Three times a day (TID) | ORAL | 2 refills | Status: DC

## 2018-10-13 MED ORDER — NORTRIPTYLINE HCL 10 MG PO CAPS: 20.0000 mg | ORAL_CAPSULE | Freq: Every day | ORAL | 2 refills | Status: DC

## 2018-10-13 MED ORDER — TIZANIDINE HCL 4 MG PO TABS: 6.0000 mg | ORAL_TABLET | Freq: Two times a day (BID) | ORAL | 2 refills | Status: DC | PRN

## 2018-10-13 NOTE — Progress Notes (Signed)
Pain Management Virtual Encounter Note - Virtual Visit via Telephone Telehealth (real-time audio visits between healthcare provider and patient).   Patient's Phone No. & Preferred Pharmacy:  [phone] (home); [phone] (mobile); (Preferred) [phone] [email or web address]  [place], [place] [address] Phone: [phone] Fax: [phone]    Pre-screening note:  Our staff contacted Thomas Cole and offered him an "in person", "face-to-face" appointment versus a telephone encounter. He indicated preferring the telephone encounter, at this time.   Reason for Virtual Visit: COVID-19*  Social distancing based on CDC and AMA recommendations.   I contacted Thomas Cole on 10/13/2018 via telephone.      I clearly identified myself as Gillis Santa, MD. I verified that I was speaking with the correct person using two identifiers (Name: Thomas Cole, and date of birth: [birth year]).  Advanced Informed Consent I sought verbal advanced consent from Thomas Cole for virtual visit interactions. I informed Thomas Cole of possible security and privacy concerns, risks, and limitations associated with providing "not-in-person" medical evaluation and management services. I also informed Thomas Cole of the availability of "in-person" appointments. Finally, I informed him that there would be a charge for the virtual visit and that he could be  personally, fully or partially, financially responsible for it. Mr. Besecker expressed understanding and agreed to proceed.   Historic Elements   Thomas Cole is a 48 y.o. year old, male patient evaluated today after his last encounter by our practice on 10/01/2018. Thomas Cole  has a past medical history of Anxiety, Asthma, Back problem, Benign essential hypertension (11/30/2016), Cervical spondylosis without myelopathy (5/46/5681), Complication of anesthesia, Depression, GERD  (gastroesophageal reflux disease), Hypertension, Labile hypertension (03/19/2013), Major depressive disorder, recurrent episode, severe ([place]) (06/23/2015), Migraine headache, Migraines (11/11/2013), S/P appendectomy (04/09/2011), Seizure ([place]) (11/30/2016), Seizures ([place]), Shortness of breath, and SOB (shortness of breath) (08/06/2014). He also  has a past surgical history that includes Knee surgery; Posterior fusion cervical spine; Cervical disc surgery; Appendectomy (12); Anterior cervical corpectomy (N/A, 09/15/2012); and Cholecystectomy (N/A, 03/29/2017). Thomas Cole has a current medication list which includes the following prescription(s): carbidopa-levodopa, vitamin d3, diazepam, duloxetine, hydrochlorothiazide, losartan-hydrochlorothiazide, melatonin, nortriptyline, omeprazole, ondansetron, phenytoin, phenytoin, polyethylene glycol powder, pregabalin, tizanidine, valsartan, and vitamin b-12. He  reports that he has never smoked. He has never used smokeless tobacco. He reports that he does not drink alcohol or use drugs. Thomas Cole is allergic to amitriptyline; bee venom; chlorhexidine; carbamazepine; meloxicam; morphine and related; and sulfa antibiotics.   HPI  Today, he is being contacted for medication management.   Patient noticing benefit with Lyrica 100 mg 3 times daily.  Endorses less numbness and tingling.  States that he is having increased muscle spasms in his neck and trapezius region.  Having difficulty sleeping at night due to the muscle spasms.  We discussed increasing his tizanidine as below.  Also discussed increasing his his nortriptyline from 10 mg to 20 mg nightly.  Patient agreement with plan.  No side effects with Lyrica such as increased sedation or lower extremity swelling.  Continue to focus on non-opioid-based pain management.  Pertinent Labs   SAFETY SCREENING Profile Lab Results  Component Value Date   STAPHAUREUS NEGATIVE 09/03/2012   MRSAPCR NEGATIVE 09/03/2012   HIV Non  Reactive 03/30/2017   Renal Function Lab Results  Component Value Date   BUN 13 04/21/2017   CREATININE 0.93 04/21/2017   GFRAA >60 04/21/2017   GFRNONAA >60 04/21/2017  Hepatic Function Lab Results  Component Value Date   AST 30 04/21/2017   ALT 23 04/21/2017   ALBUMIN 4.2 04/21/2017   UDS No results found for: SUMMARY Note: Above Lab results reviewed.  Recent imaging  DG Esophagus CLINICAL DATA:  Dysphagia. Dysphagia with solids and liquids for 1 year. Prior cervical fusion in 2014.  EXAM: ESOPHOGRAM / BARIUM SWALLOW / BARIUM TABLET STUDY  TECHNIQUE: Combined double contrast and single contrast examination performed using effervescent crystals, thick barium liquid, and thin barium liquid. The patient was observed with fluoroscopy swallowing a 13 mm barium sulphate tablet.  FLUOROSCOPY TIME:  Fluoroscopy Time:  42 seconds  Radiation Exposure Index (if provided by the fluoroscopic device): 11.3 mGy  Number of Acquired Spot Images: 0  COMPARISON:  None.  FINDINGS: There was normal pharyngeal anatomy and motility. Contrast flowed freely through the esophagus without evidence of stricture or mass. There was normal esophageal mucosa without evidence of irregularity or ulceration. Esophageal motility was normal. Mild gastroesophageal reflux. No definite hiatal hernia was demonstrated.  At the end of the examination a 13 mm barium tablet was administered which transited through the esophagus and esophagogastric junction without delay.  Incidental note made of anterior cervical fusion from C3 through C7.  IMPRESSION: 1. No esophageal stricture. 2. Mild gastroesophageal reflux.  Electronically Signed   By: Kathreen Devoid   On: 11/04/2017 10:31  Assessment  The primary encounter diagnosis was H/O cervical spinal arthrodesis (C3-C7 fusion). Diagnoses of Failed cervical fusion, Myelopathy of cervical spinal cord with cervical radiculopathy, Thoracic  radiculopathy, Spondylosis, cervical, with myelopathy, Episode of recurrent major depressive disorder, unspecified depression episode severity ([place]), Generalized anxiety disorder, PTSD (post-traumatic stress disorder), and Chronic pain syndrome were also pertinent to this visit.  Plan of Care  I have changed Thomas Cole's nortriptyline and tiZANidine. I am also having him maintain his omeprazole, losartan-hydrochlorothiazide, phenytoin, polyethylene glycol powder, diazepam, valsartan, hydrochlorothiazide, vitamin B-12, ondansetron, carbidopa-levodopa, DULoxetine, Melatonin, Vitamin D3, phenytoin, and pregabalin. From initial clinic note 5/4 Patient is a pleasant 48 year old male who endorses a chief complaint of neck, shoulder pain as well as persistent headaches.  Of note, patient sustained a traumatic injury in 2011 well and he fell on ice.  This resulted in cervical spine injury resulting in a cervical spinal fusion from C4-C6 at that time.  Afterwards, the patient continued to have persistent pain and occasional weakness of his arms and a second fusion was performed in 2013 from C3-C7.  Patient states that this surgery worsened his pain and and his functional status.  He also endorses persistent headaches that occur almost daily starting from the back of his skull and extends superiorly.  Patient states that he has tried trigger point injections with Dr. Manuella Ghazi which were not effective.  He is currently on Cymbalta 60 mg daily, nortriptyline 10 mg.  Patient did find benefit with Lyrica in the past but providers at that time thought the medication was contributing to labile blood pressures and discontinued however the patient states that he continues to have intermittent blood pressure issues even when he is not on this medication.  He states that he did obtain pain relief while he was on Lyrica.  Patient also takes tizanidine at night which he states is somewhat helpful for his cervical spasms. Patient is  not on any opioid medications.  I had extensive discussion with the patient regarding treatment options.  Patient has tried physical therapy in the past which was not very helpful  and actually worsened his headaches.  He is currently on Cymbalta 60 mg daily which I instructed him to continue.     At his previous visit, Lyrica was restarted due to gabapentin pain and effective.  Obtaining benefit on his current dose of Lyrica 100 mg 3 times daily.  Given increased muscle spasms and neck pain, discussed increasing tizanidine and nortriptyline as below.  If the above measures are not effective, patient could be a candidate for low-dose opioid therapy such as low-dose hydrocodone or buprenorphine for pain management.  In regards to interventional pain management, patient has tried cervical trigger point injections which he states were not very effective.  This could be repeated and we could focus on his trapezius region which he states is uncomfortable and bothersome at times.  We also discussed cervical and/or thoracic epidural steroid injection to help with his symptoms of cervical and thoracic radiculopathy.  Patient could also be a candidate for diagnostic cervical facet medial branch nerve blocks for cervical facet arthropathy.  However before ordering any of these, I would like to see the patient in person and conduct a physical exam which will further help guide management.  Patient was supposed to have an inpatient face-to-face visit today however given increase in COVID, opted for virtual visit which is reasonable.   Pharmacotherapy (Medications Ordered): Meds ordered this encounter  Medications  . nortriptyline (PAMELOR) 10 MG capsule    Sig: Take 2 capsules (20 mg total) by mouth at bedtime.    Dispense:  60 capsule    Refill:  2  . pregabalin (LYRICA) 100 MG capsule    Sig: Take 1 capsule (100 mg total) by mouth 3 (three) times daily.    Dispense:  90 capsule    Refill:  2    Do not place  this medication, or any other prescription from our practice, on "Automatic Refill". Patient may have prescription filled one day early if pharmacy is closed on scheduled refill date.  Marland Kitchen tiZANidine (ZANAFLEX) 4 MG tablet    Sig: Take 1.5-2 tablets (6-8 mg total) by mouth 2 (two) times daily as needed for muscle spasms.    Dispense:  120 tablet    Refill:  2   Orders:  No orders of the defined types were placed in this encounter.  Follow-up plan:   Return in about 6 weeks (around 11/24/2018) for Medication Management (IN PERSON PLEASE, need to do physical exam).     Diagnostic cervical facet medial branch nerve blocks Diagnostic thoracic facet medial branch nerve blocks Cervical and/or thoracic epidural steroid injection Cervical/trapezius trigger point injections SPG block   Recent Visits Date Type Provider Dept  08/04/18 Office Visit Gillis Santa, MD [facility]  Showing recent visits within past 90 days and meeting all other requirements   Today's Visits Date Type Provider Dept  10/13/18 Office Visit Gillis Santa, MD [facility]  Showing today's visits and meeting all other requirements   Future Appointments No visits were found meeting these conditions.  Showing future appointments within next 90 days and meeting all other requirements   I discussed the assessment and treatment plan with the patient. The patient was provided an opportunity to ask questions and all were answered. The patient agreed with the plan and demonstrated an understanding of the instructions.  Patient advised to call back or seek an in-person evaluation if the symptoms or condition worsens.  Total duration of non-face-to-face encounter: 25 minutes.  Note by: Gillis Santa, MD  Date: 10/13/2018; Time: 2:17 PM  Note: This dictation was prepared with Dragon dictation. Any transcriptional errors that may result from this process are unintentional.  Disclaimer:  * Given the special  circumstances of the COVID-19 pandemic, the federal government has announced that the Office for Civil Rights (OCR) will exercise its enforcement discretion and will not impose penalties on physicians using telehealth in the event of noncompliance with regulatory requirements under the [place] and [place] (HIPAA) in connection with the good faith provision of telehealth during the [address] national public health emergency. ([place])

## 2018-11-19 ENCOUNTER — Encounter: Payer: Self-pay | Admitting: Student in an Organized Health Care Education/Training Program

## 2018-11-20 ENCOUNTER — Encounter: Payer: Self-pay | Admitting: Student in an Organized Health Care Education/Training Program

## 2018-11-20 ENCOUNTER — Other Ambulatory Visit: Payer: Self-pay

## 2018-11-20 ENCOUNTER — Ambulatory Visit
Payer: Self-pay | Attending: Student in an Organized Health Care Education/Training Program | Admitting: Student in an Organized Health Care Education/Training Program

## 2018-11-20 DIAGNOSIS — F339 Major depressive disorder, recurrent, unspecified: Secondary | ICD-10-CM

## 2018-11-20 DIAGNOSIS — M4712 Other spondylosis with myelopathy, cervical region: Secondary | ICD-10-CM

## 2018-11-20 DIAGNOSIS — G894 Chronic pain syndrome: Secondary | ICD-10-CM

## 2018-11-20 DIAGNOSIS — M5414 Radiculopathy, thoracic region: Secondary | ICD-10-CM

## 2018-11-20 DIAGNOSIS — M960 Pseudarthrosis after fusion or arthrodesis: Secondary | ICD-10-CM | POA: Insufficient documentation

## 2018-11-20 DIAGNOSIS — F4310 Post-traumatic stress disorder, unspecified: Secondary | ICD-10-CM

## 2018-11-20 DIAGNOSIS — F411 Generalized anxiety disorder: Secondary | ICD-10-CM

## 2018-11-20 DIAGNOSIS — M5412 Radiculopathy, cervical region: Secondary | ICD-10-CM

## 2018-11-20 DIAGNOSIS — Z981 Arthrodesis status: Secondary | ICD-10-CM

## 2018-11-20 DIAGNOSIS — G959 Disease of spinal cord, unspecified: Secondary | ICD-10-CM

## 2018-11-20 MED ORDER — NORTRIPTYLINE HCL 10 MG PO CAPS: 20.0000 mg | ORAL_CAPSULE | Freq: Every day | ORAL | 3 refills | Status: DC

## 2018-11-20 MED ORDER — TIZANIDINE HCL 4 MG PO TABS: 6.0000 mg | ORAL_TABLET | Freq: Two times a day (BID) | ORAL | 3 refills | Status: DC | PRN

## 2018-11-20 MED ORDER — PREGABALIN 100 MG PO CAPS: 100.0000 mg | ORAL_CAPSULE | Freq: Three times a day (TID) | ORAL | 3 refills | Status: DC

## 2018-11-20 NOTE — Progress Notes (Signed)
Pain Management Virtual Encounter Note - Virtual Visit via [place] (real-time audio visits between healthcare provider and patient).   Patient's Phone No. & Preferred Pharmacy:  [phone] (home); [phone] (mobile); (Preferred) [phone] [email or web address]  [place], [place] [address] Phone: [phone] Fax: [phone]    Pre-screening note:  Our staff contacted Thomas Cole and offered him an "in person", "face-to-face" appointment versus a telephone encounter. He indicated preferring the telephone encounter, at this time.   Reason for Virtual Visit: COVID-19*  Social distancing based on CDC and AMA recommendations.   I contacted Thomas Cole on 11/20/2018 via video conference.      I clearly identified myself as Gillis Santa, MD. I verified that I was speaking with the correct person using two identifiers (Name: Thomas Cole, and date of birth: 1971/03/19).  Advanced Informed Consent I sought verbal advanced consent from Thomas Cole for virtual visit interactions. I informed Thomas Cole of possible security and privacy concerns, risks, and limitations associated with providing "not-in-person" medical evaluation and management services. I also informed Thomas Cole of the availability of "in-person" appointments. Finally, I informed him that there would be a charge for the virtual visit and that he could be  personally, fully or partially, financially responsible for it. Thomas Cole expressed understanding and agreed to proceed.   Historic Elements   Thomas Cole is a 48 y.o. year old, male patient evaluated today after his last encounter by our practice on 10/13/2018. Thomas Cole  has a past medical history of Anxiety, Asthma, Back problem, Benign essential hypertension (11/30/2016), Cervical spondylosis without myelopathy (3/42/8768), Complication of anesthesia, Depression, GERD  (gastroesophageal reflux disease), Hypertension, Labile hypertension (03/19/2013), Major depressive disorder, recurrent episode, severe ([place]) (06/23/2015), Migraine headache, Migraines (11/11/2013), S/P appendectomy (04/09/2011), Seizure ([place]) (11/30/2016), Seizures ([place]), Shortness of breath, and SOB (shortness of breath) (08/06/2014). He also  has a past surgical history that includes Knee surgery; Posterior fusion cervical spine; Cervical disc surgery; Appendectomy (12); Anterior cervical corpectomy (N/A, 09/15/2012); and Cholecystectomy (N/A, 03/29/2017). Thomas Cole has a current medication list which includes the following prescription(s): carbidopa-levodopa, vitamin d3, diazepam, duloxetine, hydrochlorothiazide, melatonin, nortriptyline, omeprazole, ondansetron, phenytoin, phenytoin, polyethylene glycol powder, pregabalin, tizanidine, valsartan, vitamin b-12, and losartan-hydrochlorothiazide. He  reports that he has never smoked. He has never used smokeless tobacco. He reports that he does not drink alcohol or use drugs. Thomas Cole is allergic to amitriptyline; bee venom; chlorhexidine; carbamazepine; meloxicam; morphine and related; and sulfa antibiotics.   HPI  Today, he is being contacted for medication management.  Patient states that his current regimen of nortriptyline 20 mg nightly, Lyrica 100 mg 3 times daily, tizanidine 6 to 8 mg twice daily as needed is helping to manage his symptoms of cervical myofascial pain syndrome and spasms.  Patient is endorsing some dry mouth and occasional blurry vision.  For this reason I am reluctant to increase his nortriptyline as these are likely mild anticholinergic side effects that he is experiencing.  Patient instructed to continue his medications as prescribed.  I instructed him to take 8 mg of tizanidine twice a day when he is having severe muscle spasms otherwise he can restrict his intake to 6 mg twice daily as needed.  Patient and his home health aide endorsed  understanding.  Patient will follow-up in approximately 3 months.  Laboratory Chemistry Profile (12 mo)  Renal: No results found for requested labs within last 8760 hours.  Lab Results  Component Value Date   GFRAA >60 04/21/2017   GFRNONAA >60 04/21/2017   Hepatic: No results found for requested labs within last 8760 hours. Lab Results  Component Value Date   AST 30 04/21/2017   ALT 23 04/21/2017   Other: No results found for requested labs within last 8760 hours. Note: Above Lab results reviewed.   Assessment  The primary encounter diagnosis was H/O cervical spinal arthrodesis (C3-C7 fusion). Diagnoses of Failed cervical fusion, Myelopathy of cervical spinal cord with cervical radiculopathy, Thoracic radiculopathy, Spondylosis, cervical, with myelopathy, Episode of recurrent major depressive disorder, unspecified depression episode severity ([place]), Generalized anxiety disorder, PTSD (post-traumatic stress disorder), and Chronic pain syndrome were also pertinent to this visit.  Plan of Care  I am having Daniyal A. Gomer maintain his omeprazole, losartan-hydrochlorothiazide, phenytoin, polyethylene glycol powder, diazepam, valsartan, hydrochlorothiazide, vitamin B-12, ondansetron, carbidopa-levodopa, DULoxetine, Melatonin, Vitamin D3, phenytoin, nortriptyline, tiZANidine, and pregabalin.  Pharmacotherapy (Medications Ordered): Meds ordered this encounter  Medications  . nortriptyline (PAMELOR) 10 MG capsule    Sig: Take 2 capsules (20 mg total) by mouth at bedtime.    Dispense:  60 capsule    Refill:  3  . tiZANidine (ZANAFLEX) 4 MG tablet    Sig: Take 1.5-2 tablets (6-8 mg total) by mouth 2 (two) times daily as needed for muscle spasms.    Dispense:  120 tablet    Refill:  3  . pregabalin (LYRICA) 100 MG capsule    Sig: Take 1 capsule (100 mg total) by mouth 3 (three) times daily.    Dispense:  90 capsule    Refill:  3    Do not place this medication, or any other prescription  from our practice, on "Automatic Refill". Patient may have prescription filled one day early if pharmacy is closed on scheduled refill date.   Follow-up plan:   Return in about 4 months (around 03/22/2019) for Medication Management.     Diagnostic cervical facet medial branch nerve blocks Diagnostic thoracic facet medial branch nerve blocks Cervical and/or thoracic epidural steroid injection Cervical/trapezius trigger point injections SPG block    Recent Visits Date Type Provider Dept  10/13/18 Office Visit Gillis Santa, MD [facility]  Showing recent visits within past 90 days and meeting all other requirements   Today's Visits Date Type Provider Dept  11/20/18 Office Visit Gillis Santa, MD [facility]  Showing today's visits and meeting all other requirements   Future Appointments No visits were found meeting these conditions.  Showing future appointments within next 90 days and meeting all other requirements   I discussed the assessment and treatment plan with the patient. The patient was provided an opportunity to ask questions and all were answered. The patient agreed with the plan and demonstrated an understanding of the instructions.  Patient advised to call back or seek an in-person evaluation if the symptoms or condition worsens.  Total duration of non-face-to-face encounter: 25 minutes.  Note by: Gillis Santa, MD Date: 11/20/2018; Time: 12:33 PM  Note: This dictation was prepared with Dragon dictation. Any transcriptional errors that may result from this process are unintentional.  Disclaimer:  * Given the special circumstances of the COVID-19 pandemic, the federal government has announced that the Office for Civil Rights (OCR) will exercise its enforcement discretion and will not impose penalties on physicians using telehealth in the event of noncompliance with regulatory requirements under the [place] and [place]  (HIPAA) in connection with the good  faith provision of telehealth during the FREVQ-00 national public health emergency. ([place])

## 2018-12-04 DIAGNOSIS — G20C Parkinsonism, unspecified: Secondary | ICD-10-CM | POA: Insufficient documentation

## 2018-12-09 ENCOUNTER — Encounter: Payer: Self-pay | Admitting: Student in an Organized Health Care Education/Training Program

## 2019-01-20 ENCOUNTER — Telehealth: Payer: Self-pay | Admitting: Student in an Organized Health Care Education/Training Program

## 2019-01-20 NOTE — Telephone Encounter (Signed)
Patient called and informed that Dr. Holley Raring would like to eval first. He is agreeable. Please call the patient and schedule for eval- virtual or in person- Patient choice. Thank you- Anderson Malta

## 2019-01-20 NOTE — Telephone Encounter (Signed)
Patient lvmail at 3:10 stating he is having some pretty intense pain and would like to come in on Monday for some type of procedure to help with this. His next scheduled appt is for MM in Dec

## 2019-01-20 NOTE — Telephone Encounter (Signed)
Having neck and HA pain and has increased over the last few days. Has not had any procedures done per Dr. Holley Raring but has had some with neurologist. Dr. Holley Raring please advise and if he needs an eval appt first I can set that up.

## 2019-01-21 NOTE — Telephone Encounter (Signed)
Patient scheduled 01-28-19 for Virtual Visit Call

## 2019-01-27 ENCOUNTER — Telehealth: Payer: Self-pay | Admitting: *Deleted

## 2019-01-27 ENCOUNTER — Telehealth: Payer: Self-pay

## 2019-01-27 ENCOUNTER — Telehealth: Payer: Self-pay | Admitting: Student in an Organized Health Care Education/Training Program

## 2019-01-27 NOTE — Telephone Encounter (Signed)
Calling caregiver for information.

## 2019-01-27 NOTE — Telephone Encounter (Signed)
Patient has caregiver, Wendi Snipes, returned call to Nurse for Chart Update. appt 01-28-19 2pm  Please call her at [phone] to update Chart and Meds Info

## 2019-01-27 NOTE — Telephone Encounter (Signed)
Caregiver did not answer. Left message to call

## 2019-01-27 NOTE — Telephone Encounter (Signed)
Attempted to call patients caregiver as requested per in box. No answer, left message to call

## 2019-01-27 NOTE — Telephone Encounter (Signed)
Attempted to call for pre appointment review of allergies/meds. Message left. 

## 2019-01-28 ENCOUNTER — Other Ambulatory Visit: Payer: Self-pay

## 2019-01-28 ENCOUNTER — Ambulatory Visit
Payer: BC Managed Care – PPO | Attending: Student in an Organized Health Care Education/Training Program | Admitting: Student in an Organized Health Care Education/Training Program

## 2019-01-28 ENCOUNTER — Encounter: Payer: Self-pay | Admitting: Student in an Organized Health Care Education/Training Program

## 2019-01-28 DIAGNOSIS — M4712 Other spondylosis with myelopathy, cervical region: Secondary | ICD-10-CM

## 2019-01-28 DIAGNOSIS — Z981 Arthrodesis status: Secondary | ICD-10-CM

## 2019-01-28 DIAGNOSIS — M960 Pseudarthrosis after fusion or arthrodesis: Secondary | ICD-10-CM | POA: Diagnosis not present

## 2019-01-28 DIAGNOSIS — M5412 Radiculopathy, cervical region: Secondary | ICD-10-CM

## 2019-01-28 DIAGNOSIS — F411 Generalized anxiety disorder: Secondary | ICD-10-CM

## 2019-01-28 DIAGNOSIS — G959 Disease of spinal cord, unspecified: Secondary | ICD-10-CM

## 2019-01-28 DIAGNOSIS — G894 Chronic pain syndrome: Secondary | ICD-10-CM

## 2019-01-28 MED ORDER — HYDROCODONE-ACETAMINOPHEN 5-325 MG PO TABS: 1.0000 | ORAL_TABLET | Freq: Four times a day (QID) | ORAL | 0 refills | Status: DC | PRN

## 2019-01-28 NOTE — Progress Notes (Signed)
Pain Management Virtual Encounter Note - Virtual Visit via Telephone Telehealth (real-time audio visits between healthcare provider and patient).   Patient's Phone No. & Preferred Pharmacy:  [phone] (home); [phone] (mobile); (Preferred) [phone] [email or web address]  [address], [place] [address] Phone: [phone] Fax: [phone]    Pre-screening note:  Our staff contacted [name] and offered him an "in person", "face-to-face" appointment versus a telephone encounter. He indicated preferring the telephone encounter, at this time.   Reason for Virtual Visit: COVID-19*  Social distancing based on CDC and AMA recommendations.   I contacted Thomas Cole on 01/28/2019 via telephone.      I clearly identified myself as Gillis Santa, MD. I verified that I was speaking with the correct person using two identifiers (Name: Thomas Cole, and date of birth: 02/18/1971).  Advanced Informed Consent I sought verbal advanced consent from Thomas Cole for virtual visit interactions. I informed Thomas Cole of possible security and privacy concerns, risks, and limitations associated with providing "not-in-person" medical evaluation and management services. I also informed Thomas Cole of the availability of "in-person" appointments. Finally, I informed him that there would be a charge for the virtual visit and that he could be  personally, fully or partially, financially responsible for it. Thomas Cole expressed understanding and agreed to proceed.   Historic Elements   Thomas Cole is a 48 y.o. year old, male patient evaluated today after his last encounter by our practice on 01/27/2019. Thomas Cole  has a past medical history of Anxiety, Asthma, Back problem, Benign essential hypertension (11/30/2016), Cervical spondylosis without myelopathy ([id number]), Complication of anesthesia, Depression, GERD  (gastroesophageal reflux disease), Hypertension, Labile hypertension (03/19/2013), Major depressive disorder, recurrent episode, severe ([place]) (06/23/2015), Migraine headache, Migraines (11/11/2013), S/P appendectomy (04/09/2011), Seizure ([place]) (11/30/2016), Seizures ([place]), Shortness of breath, and SOB (shortness of breath) (08/06/2014). He also  has a past surgical history that includes Knee surgery; Posterior fusion cervical spine; Cervical disc surgery; Appendectomy (12); Anterior cervical corpectomy (N/A, 09/15/2012); and Cholecystectomy (N/A, 03/29/2017). Thomas Cole has a current medication list which includes the following prescription(s): carbidopa-levodopa, vitamin d3, diazepam, duloxetine, hydrochlorothiazide, hydrocodone-acetaminophen, losartan-hydrochlorothiazide, melatonin, nortriptyline, omeprazole, ondansetron, phenytoin, phenytoin, polyethylene glycol powder, pregabalin, tizanidine, valsartan, and vitamin b-12. He  reports that he has never smoked. He has never used smokeless tobacco. He reports that he does not drink alcohol or use drugs. Thomas Cole is allergic to amitriptyline; bee venom; chlorhexidine; carbamazepine; meloxicam; morphine and related; and sulfa antibiotics.   HPI  Today, he is being contacted for Worsening neck pain and headaches.   Patient states that he has been having a difficult time with his neck pain and headaches.  He states that his pain is very severe and is preventing him from sleeping and doing basic ADLs.  Patient is on Cymbalta 60 mg daily, nortriptyline 20 mg nightly which was increased at his last visit, Lyrica 100 mg 3 times daily along with Zanaflex 6 to 8 mg twice daily as needed.  This was helping to manage his pain but over the last 2 to 3 weeks, he states that his headaches and his neck pain with occasional radiation to bilateral shoulders and arms is getting much worse.   Laboratory Chemistry Profile (12 mo)  Renal: No results found for requested labs within  last 8760 hours.  Lab Results  Component Value Date   GFR 82.19 08/05/2014   GFRAA >60 04/21/2017   GFRNONAA >  60 04/21/2017   Hepatic: No results found for requested labs within last 8760 hours. Lab Results  Component Value Date   AST 30 04/21/2017   ALT 23 04/21/2017   Other: No results found for requested labs within last 8760 hours. Note: Above Lab results reviewed.  Imaging  Acute Interface, Incoming Rad Results - 05/14/2017 10:22 AM EST TECHNIQUE: Multiplanar, multisequence MR imaging of the cervical spine was performed prior to and following the intravenous administration of 20 cc ProHance gadolinium-based contrast.   COMPARISON: None  INDICATION: Other symptoms and signs involving the musculoskeletal system  FINDINGS: . Bones:  Vertebral body heights are maintained. No marrow edema or focal aggressive bone lesion. Small benign osseous hemangioma at T2 . Spinal cord: Thinning and high signal in the cord at the C4-5 level. No cord enhancement . Alignment: Anatomic without subluxation. . Degenerative changes:  . C1-C2: No significant central canal or neural foraminal stenosis. . C2-C3: Mild degenerative disc disease. Small left eccentric broad-based disc osteophyte complex. Minimal central canal and left foraminal stenosis . C3-C4: Status post ACDF with mature bridging bone across the disc space. No evidence of stenosis. Marland Kitchen C4-C5: Status post ACDF with interbody spacer and mature bridging bone across the disc space. Small bony spicule in the left paracentral region flattening the spinal cord. Moderate focal central canal stenosis here. Marland Kitchen C5-C6: Status post ACDF. No complicating features . C6-C7: Status post ACDF. No complicating features . C7-T1: Small broad-based disc osteophyte complex. Mild bilateral facet arthrosis. Moderate bilateral foraminal stenosis.  T1-T2: Mild to moderate right foraminal stenosis . Additional findings: None  IMPRESSION:  Status post ACDF from C3  through C7. There is myelomalacia with thinning and high signal of the cord at C4-5, adjacent to a left paracentral osteophyte.   Mild central canal and left foraminal stenosis at C2-3   Moderate bilateral foraminal stenosis at T1-T2  Mild to moderate right foraminal stenosis at C7-T1  Electronically Signed by: Carrington Clamp  Assessment  The primary encounter diagnosis was Myelopathy of cervical spinal cord with cervical radiculopathy. Diagnoses of H/O cervical spinal arthrodesis (C3-C7 fusion), Failed cervical fusion, Spondylosis, cervical, with myelopathy, Generalized anxiety disorder, and Chronic pain syndrome were also pertinent to this visit.  Plan of Care  I am having Thomas Cole start on HYDROcodone-acetaminophen. I am also having him maintain his omeprazole, losartan-hydrochlorothiazide, phenytoin, polyethylene glycol powder, diazepam, valsartan, hydrochlorothiazide, vitamin B-12, ondansetron, carbidopa-levodopa, DULoxetine, Melatonin, Vitamin D3, phenytoin, nortriptyline, tiZANidine, and pregabalin.  Patient's previous MRI was performed February 201. He has an ACDF from C3-C7.  His cervical MRI showed myelomalacia with high signal at C4-C5 adjacent to a left paracentral osteophyte.  Given that the patient's pain is getting worse, recommend repeating cervical MRI to evaluate the extent of myelomalacia.  Depending upon MRI results can discuss various injection therapies such as diagnostic cervical facet medial branch nerve blocks versus cervical epidural steroid injection.  If his myelomalacia has worsened, may recommend patient revisit his cervical spine surgeon.  I instructed the patient to continue his multimodal analgesics as prescribed including nortriptyline, tizanidine, Lyrica, Cymbalta.  I will also provide him with a short supply of hydrocodone as below to help him with his acute severe, breakthrough pain when he does have it.  I will see the patient back in 3 weeks.   Hopefully he will have completed his cervical MRI at that time and we can discuss a treatment plan.  Patient prefers an open MRI due to of anxiety.  I  have requested this below.  Pharmacotherapy (Medications Ordered): Meds ordered this encounter  Medications  . HYDROcodone-acetaminophen (NORCO/VICODIN) 5-325 MG tablet    Sig: Take 1 tablet by mouth every 6 (six) hours as needed for up to 7 days for severe pain. Must last 7 days.    Dispense:  28 tablet    Refill:  0    For acute pain. Not to be refilled. Must last 7 days.   Orders:  Orders Placed This Encounter  Procedures  . MR CERVICAL SPINE WO CONTRAST    In addition to any acute findings, please report on degenerative changes related to: (Please specify level(s)) (1) ROM & instability (>24mm displacement) (2) Facet joint (Zygoapophyseal Joint) (3) DDD and/or IVDD (4) Pars defects (5) Previous surgical changes (Include description of hardware and hardware status, if present) (6) Presence and degree of spondylolisthesis, spondylosis, and/or spondyloarthropathies)  (7) Old Fractures (8) Demineralization (9) Additional bone pathology (10) Stenosis (Central, Lateral Recess, Foraminal) (11) If at all possible, please provide AP diameter (mm) of foraminal and/or central canal.    Standing Status:   Future    Standing Expiration Date:   04/30/2019    Scheduling Instructions:     Patient prefers OPEN MRI    Order Specific Question:   What is the patient's sedation requirement?    Answer:   Anti-anxiety    Order Specific Question:   Does the patient have a pacemaker or implanted devices?    Answer:   No    Order Specific Question:   Preferred imaging location?    Answer:   [facility] (table limit-350lbs)    Order Specific Question:   Call Results- Best Contact Number?    Answer:   [phone] ([facility])    Order Specific Question:   Radiology Contrast Protocol - do NOT remove file path    Answer:    [email or web address]    Order Specific Question:   ** REASON FOR EXAM (FREE TEXT)    Answer:   Neck Pain & Radiculitis   Follow-up plan:   Return in about 3 weeks (around 02/18/2019) for in person, After Imaging.     Diagnostic cervical facet medial branch nerve blocks Diagnostic thoracic facet medial branch nerve blocks Cervical and/or thoracic epidural steroid injection Cervical/trapezius trigger point injections SPG block     Recent Visits Date Type Provider Dept  11/20/18 Office Visit Gillis Santa, MD [facility]  Showing recent visits within past 90 days and meeting all other requirements   Today's Visits Date Type Provider Dept  01/28/19 Office Visit Gillis Santa, MD [facility]  Showing today's visits and meeting all other requirements   Future Appointments Date Type Provider Dept  03/17/19 Appointment Gillis Santa, MD [facility]  Showing future appointments within next 90 days and meeting all other requirements   I discussed the assessment and treatment plan with the patient. The patient was provided an opportunity to ask questions and all were answered. The patient agreed with the plan and demonstrated an understanding of the instructions.  Patient advised to call back or seek an in-person evaluation if the symptoms or condition worsens.  Total duration of non-face-to-face encounter: 25 minutes.  Note by: Gillis Santa, MD Date: 01/28/2019; Time: 4:22 PM  Note: This dictation was prepared with Dragon dictation. Any transcriptional errors that may result from this process are unintentional.  Disclaimer:  * Given the special circumstances of the COVID-19 pandemic, the federal government has announced that  the Office for Civil Rights (OCR) will exercise its enforcement discretion and will not impose penalties on physicians using telehealth in the event of noncompliance with regulatory requirements under the [place] and Accountability Act (HIPAA) in connection with the good faith provision of telehealth during the XX123456 national public health emergency. ([place])

## 2019-02-04 ENCOUNTER — Telehealth: Payer: Self-pay | Admitting: *Deleted

## 2019-02-09 ENCOUNTER — Other Ambulatory Visit: Payer: Self-pay | Admitting: Student in an Organized Health Care Education/Training Program

## 2019-02-09 DIAGNOSIS — Z981 Arthrodesis status: Secondary | ICD-10-CM

## 2019-02-09 DIAGNOSIS — M4712 Other spondylosis with myelopathy, cervical region: Secondary | ICD-10-CM

## 2019-02-09 DIAGNOSIS — G959 Disease of spinal cord, unspecified: Secondary | ICD-10-CM

## 2019-02-09 DIAGNOSIS — M7918 Myalgia, other site: Secondary | ICD-10-CM

## 2019-02-09 DIAGNOSIS — M47812 Spondylosis without myelopathy or radiculopathy, cervical region: Secondary | ICD-10-CM

## 2019-02-17 ENCOUNTER — Ambulatory Visit: Payer: BC Managed Care – PPO

## 2019-02-19 ENCOUNTER — Other Ambulatory Visit: Payer: Self-pay | Admitting: Student in an Organized Health Care Education/Training Program

## 2019-02-19 DIAGNOSIS — G894 Chronic pain syndrome: Secondary | ICD-10-CM

## 2019-03-03 ENCOUNTER — Ambulatory Visit
Admission: RE | Admit: 2019-03-03 | Discharge: 2019-03-03 | Disposition: A | Discharge status: Home / Self Care | Payer: BC Managed Care – PPO | Source: Ambulatory Visit | Attending: Student in an Organized Health Care Education/Training Program | Admitting: Student in an Organized Health Care Education/Training Program

## 2019-03-03 ENCOUNTER — Other Ambulatory Visit: Payer: Self-pay

## 2019-03-03 ENCOUNTER — Ambulatory Visit: Payer: BC Managed Care – PPO | Admitting: Student in an Organized Health Care Education/Training Program

## 2019-03-03 DIAGNOSIS — M4712 Other spondylosis with myelopathy, cervical region: Secondary | ICD-10-CM

## 2019-03-03 DIAGNOSIS — G959 Disease of spinal cord, unspecified: Secondary | ICD-10-CM

## 2019-03-03 DIAGNOSIS — Z981 Arthrodesis status: Secondary | ICD-10-CM

## 2019-03-03 DIAGNOSIS — M5412 Radiculopathy, cervical region: Secondary | ICD-10-CM

## 2019-03-04 ENCOUNTER — Telehealth: Payer: Self-pay | Admitting: *Deleted

## 2019-03-04 DIAGNOSIS — M4712 Other spondylosis with myelopathy, cervical region: Secondary | ICD-10-CM

## 2019-03-04 DIAGNOSIS — G894 Chronic pain syndrome: Secondary | ICD-10-CM

## 2019-03-04 DIAGNOSIS — Z981 Arthrodesis status: Secondary | ICD-10-CM

## 2019-03-04 DIAGNOSIS — G959 Disease of spinal cord, unspecified: Secondary | ICD-10-CM

## 2019-03-04 DIAGNOSIS — M960 Pseudarthrosis after fusion or arthrodesis: Secondary | ICD-10-CM

## 2019-03-04 MED ORDER — HYDROCODONE-ACETAMINOPHEN 5-325 MG PO TABS: 1.0000 | ORAL_TABLET | Freq: Two times a day (BID) | ORAL | 0 refills | Status: DC | PRN

## 2019-03-04 NOTE — Telephone Encounter (Signed)
Spoke with [place], [place] for Thomas Cole.  They are requesting a refill of hydrocodone, states [place] did not think to ask about this  When he and Dr Holley Raring spoke this morning.  Also, there was some discussion about being referred to neurologist.  She states that Thomas Cole has already seen Thomas Cole and would like to go back to him rather than starting with a new provider.  I told her I would communicate with Dr Holley Raring to let him know these request.

## 2019-03-04 NOTE — Telephone Encounter (Signed)
Referral placed for Dr Aris Lot. Rx for Hydrocodone sent in, 7 days. PMP checked and appropriate.  0/28/2020 1 01/28/2019 Hydrocodone-Acetamin 5-325 Mg 28.00 7 Bi Lat [id number] ([id number]) 0/0 20.00 MME Comm Ins [place]  Orders Placed This Encounter  Procedures  . [facility]    Referral Priority:   Routine    Referral Type:   Surgical    Referral Reason:   Specialty Services Required    Referred to Provider:   Bayard Hugger, MD    Requested Specialty:   Neurosurgery    Number of Visits Requested:   1    Requested Prescriptions   Signed Prescriptions Disp Refills  . HYDROcodone-acetaminophen (NORCO/VICODIN) 5-325 MG tablet 60 tablet 0    Sig: Take 1 tablet by mouth 2 (two) times daily as needed for severe pain. Must last 30 days.    Authorizing Provider: Gillis Santa

## 2019-03-04 NOTE — Telephone Encounter (Signed)
Called to [place] know that Rx has been sent and referral placed.

## 2019-03-04 NOTE — Progress Notes (Signed)
Called patient to discuss results. Continues to endorse sharp pain in base of neck that extends superiorly to the top of his skull. Chronic headaches and tinnitus. Has tried occipital nerve blocks and shoulder TPI in the past. States that majority of his pain is in his neck. I offered the patient a C7-T1 facet medial branch block for his cervical facet arthropathy along with TPI for his cervical paraspinal muscles. He will discuss with wife and call if he would like to proceed, will place referral to Dr Cari Caraway to see if surgical intervention is indicated and patient prefers to see a neurosurgeon given worsening sx.  1. Myelopathy of cervical spinal cord with cervical radiculopathy  - MR CERVICAL SPINE WO CONTRAST; Future - [facility]  2. History of spinal arthrodesis  - MR CERVICAL SPINE WO CONTRAST; Future  3. Cervical facet joint syndrome  - C-FCT Blk (PRN); Standing  4. Cervical myofascial pain syndrome  - MNB (PRN); Standing

## 2019-03-16 ENCOUNTER — Other Ambulatory Visit: Payer: Self-pay

## 2019-03-16 ENCOUNTER — Encounter: Payer: Self-pay | Admitting: Student in an Organized Health Care Education/Training Program

## 2019-03-16 ENCOUNTER — Ambulatory Visit
Payer: BC Managed Care – PPO | Attending: Student in an Organized Health Care Education/Training Program | Admitting: Student in an Organized Health Care Education/Training Program

## 2019-03-16 DIAGNOSIS — M4712 Other spondylosis with myelopathy, cervical region: Secondary | ICD-10-CM | POA: Diagnosis not present

## 2019-03-16 DIAGNOSIS — G894 Chronic pain syndrome: Secondary | ICD-10-CM

## 2019-03-16 DIAGNOSIS — M960 Pseudarthrosis after fusion or arthrodesis: Secondary | ICD-10-CM

## 2019-03-16 DIAGNOSIS — Z981 Arthrodesis status: Secondary | ICD-10-CM | POA: Diagnosis not present

## 2019-03-16 DIAGNOSIS — M47812 Spondylosis without myelopathy or radiculopathy, cervical region: Secondary | ICD-10-CM

## 2019-03-16 DIAGNOSIS — F4310 Post-traumatic stress disorder, unspecified: Secondary | ICD-10-CM

## 2019-03-16 DIAGNOSIS — F411 Generalized anxiety disorder: Secondary | ICD-10-CM

## 2019-03-16 DIAGNOSIS — G959 Disease of spinal cord, unspecified: Secondary | ICD-10-CM

## 2019-03-16 DIAGNOSIS — M5412 Radiculopathy, cervical region: Secondary | ICD-10-CM

## 2019-03-16 MED ORDER — HYDROCODONE-ACETAMINOPHEN 5-325 MG PO TABS: 1.0000 | ORAL_TABLET | Freq: Two times a day (BID) | ORAL | 0 refills | Status: DC | PRN

## 2019-03-16 MED ORDER — PREGABALIN 100 MG PO CAPS: 100.0000 mg | ORAL_CAPSULE | Freq: Three times a day (TID) | ORAL | 5 refills | Status: DC

## 2019-03-16 NOTE — Progress Notes (Addendum)
Pain Management Virtual Encounter Note - Virtual Visit via Telephone Telehealth (real-time audio visits between healthcare provider and patient).   Patient's Phone No. & Preferred Pharmacy:  [phone] (home); [phone] (mobile); (Preferred) [phone] [email or web address]  [place] [place] [address] Phone: [phone] Fax: [phone]    Pre-screening note:  Our staff contacted Mr. Debaere and offered him an "in person", "face-to-face" appointment versus a telephone encounter. He indicated preferring the telephone encounter, at this time.   Reason for Virtual Visit: COVID-19*  Social distancing based on CDC and AMA recommendations.   I contacted Lelon Mast on 03/16/2019 via telephone.      I clearly identified myself as Gillis Santa, MD. I verified that I was speaking with the correct person using two identifiers (Name: CAYDN CZAPLA, and date of birth: 1970/05/01).  Advanced Informed Consent I sought verbal advanced consent from Lelon Mast for virtual visit interactions. I informed Mr. Monastra of possible security and privacy concerns, risks, and limitations associated with providing "not-in-person" medical evaluation and management services. I also informed Mr. Dagley of the availability of "in-person" appointments. Finally, I informed him that there would be a charge for the virtual visit and that he could be  personally, fully or partially, financially responsible for it. Mr. Berst expressed understanding and agreed to proceed.   Historic Elements   Mr. KAILON DESPAIN is a 48 y.o. year old, male patient evaluated today after his last encounter by our practice on 03/04/2019. Mr. Cink  has a past medical history of Anxiety, Asthma, Back problem, Benign essential hypertension (11/30/2016), Cervical spondylosis without myelopathy ([id number]), Complication of anesthesia, Depression, GERD  (gastroesophageal reflux disease), Hypertension, Labile hypertension (03/19/2013), Major depressive disorder, recurrent episode, severe ([place]) (06/23/2015), Migraine headache, Migraines (11/11/2013), S/P appendectomy (04/09/2011), Seizure ([place]) (11/30/2016), Seizures ([place]), Shortness of breath, and SOB (shortness of breath) (08/06/2014). He also  has a past surgical history that includes Knee surgery; Posterior fusion cervical spine; Cervical disc surgery; Appendectomy (12); Anterior cervical corpectomy (N/A, 09/15/2012); and Cholecystectomy (N/A, 03/29/2017). Mr. Leppanen has a current medication list which includes the following prescription(s): carbidopa-levodopa, vitamin d3, duloxetine, hydrochlorothiazide, hydrocodone-acetaminophen, nortriptyline, omeprazole, ondansetron, phenytoin, polyethylene glycol powder, pregabalin, tizanidine, valsartan, vitamin b-12, diazepam, losartan-hydrochlorothiazide, melatonin, and phenytoin. He  reports that he has never smoked. He has never used smokeless tobacco. He reports that he does not drink alcohol or use drugs. Mr. Lorenc is allergic to amitriptyline; bee venom; chlorhexidine; carbamazepine; meloxicam; morphine and related; and sulfa antibiotics.   HPI  Today, he is being contacted for medication management.   Pt requesting refill of Lyrica and Hydrocodone. Did see Neurology in the interim- appreciate their involvement Pt has appointment with Dr Aris Lot NSG end of this month, will await A&P from that visit before discussing treatment options here which may include C7-T1 cervical facets given C7-T1 facet hypertophy R>L  Pharmacotherapy Assessment  Analgesic: /05/2018  2   03/04/2019  Hydrocodone-Acetamin 5-325 MG  60.00  30 Bi Lat   [id number]   Gib (4800)   0  10.00 MME  Comm Ins   [place]     Monitoring: Pharmacotherapy: No side-effects or adverse reactions reported. [place] PMP: PDMP reviewed during this encounter.       Compliance: No problems identified. Effectiveness:  Clinically acceptable. Plan: Refer to "POC".  UDS: No results found for: SUMMARY UDS at next visit Laboratory Chemistry Profile (12 mo)  Renal: No results found for requested labs within  last 8760 hours.  Lab Results  Component Value Date   GFR 82.19 08/05/2014   GFRAA >60 04/21/2017   GFRNONAA >60 04/21/2017   Hepatic: No results found for requested labs within last 8760 hours. Lab Results  Component Value Date   AST 30 04/21/2017   ALT 23 04/21/2017   Other: No results found for requested labs within last 8760 hours. Note: Above Lab results reviewed.  Imaging  MR CERVICAL SPINE WO CONTRAST CLINICAL DATA:  48 year old male with prior cervical spine fusion. Myelopathy. Headaches and chronic neck pain.  EXAM: MRI CERVICAL SPINE WITHOUT CONTRAST  TECHNIQUE: Multiplanar, multisequence MR imaging of the cervical spine was performed. No intravenous contrast was administered.  COMPARISON:  [facility] neurosurgery cervical spine radiographs 07/09/2013. Cervical spine MRI 05/29/2011.  FINDINGS: Alignment: Stable since 2015 with straightening of cervical lordosis.  Vertebrae: Previous C3 through C7 ACDF, with fusion at both C3-C4 and C6-C7 since the 2013 MRI. Mild associated hardware susceptibility artifact. No marrow edema or evidence of acute osseous abnormality. Incidental benign vertebral body hemangioma at T2.  Cord: Chronic cervical spinal cord myelomalacia with severe cord atrophy at the C5 level (series 7, image 16), not significantly changed since 2013. Overall cord signal and volume appears stable since that time.  Posterior Fossa, vertebral arteries, paraspinal tissues: Cervicomedullary junction is within normal limits. Negative visible posterior fossa. Preserved major vascular flow voids in the neck, the left vertebral artery appears mildly dominant. Negative neck soft tissues.  Disc levels:  C2-C3: Bulky hypertrophy of the posterior longitudinal  ligament superimposed on increased disc bulging at this level since 2013. Mild posterior element hypertrophy. Subsequent new mild spinal stenosis and mild spinal cord mass effect (series 7, image 5). Stable borderline to mild left C3 foraminal stenosis.  C3-C4:  Prior ACDF with solid arthrodesis.  C4-C5:  Chronic fusion with solid arthrodesis.  C5-C6:  Chronic fusion with solid arthrodesis.  C6-C7:  Prior ACDF with solid arthrodesis.  C7-T1: Mild to moderate facet hypertrophy greater on the right. Mild endplate spurring. No spinal stenosis. Mild left and mild to moderate right C8 foraminal stenosis appears increased since 2013.  Partially visible upper thoracic facet hypertrophy greater on the right. There is associated moderate to severe bilateral T1 foraminal stenosis which has increased since 2013.  IMPRESSION: 1. Chronic cervical spinal cord myelomalacia, severe at the C5 level, not significantly changed since 2013. 2. Solid-appearing fusion from the C3 to the C7 level. Adjacent segment disease at C2-C3 with bulky ligamentous hypertrophy and new mild spinal stenosis and mild spinal cord mass effect. Lesser adjacent segment disease at C7-T1 but also progressed since 2013 with mild-to-moderate C8 foraminal stenosis. 3. Upper thoracic facet hypertrophy, moderate to severe bilateral T1 foraminal stenosis.  Electronically Signed   By: Genevie Ann M.D.   On: 03/04/2019 01:24   Assessment  The primary encounter diagnosis was H/O cervical spinal arthrodesis (C3-C7 fusion). Diagnoses of Myelopathy of cervical spinal cord with cervical radiculopathy, Failed cervical fusion, Cervical facet joint syndrome, Spondylosis, cervical, with myelopathy, Generalized anxiety disorder, PTSD (post-traumatic stress disorder), and Chronic pain syndrome were also pertinent to this visit.  Plan of Care   I am having Daley A. Viscardi maintain his omeprazole, losartan-hydrochlorothiazide, phenytoin,  polyethylene glycol powder, diazepam, valsartan, hydrochlorothiazide, vitamin B-12, ondansetron, carbidopa-levodopa, DULoxetine, Melatonin, Vitamin D3, phenytoin, nortriptyline, tiZANidine, pregabalin, and HYDROcodone-acetaminophen.  Pharmacotherapy (Medications Ordered): Meds ordered this encounter  Medications  . pregabalin (LYRICA) 100 MG capsule    Sig: Take 1 capsule (100 mg  total) by mouth 3 (three) times daily.    Dispense:  90 capsule    Refill:  5    Do not place this medication, or any other prescription from our practice, on "Automatic Refill". Patient may have prescription filled one day early if pharmacy is closed on scheduled refill date.  [facility] HYDROcodone-acetaminophen (NORCO/VICODIN) 5-325 MG tablet    Sig: Take 1 tablet by mouth 2 (two) times daily as needed for severe pain. Must last 30 days.    Dispense:  60 tablet    Refill:  0    Chronic Pain. (STOP Act - Not applicable). Fill one day early if closed on scheduled refill date.   Continue multimodal pain regimen with Cymbalta, Nortiptyline, and Tizanidine prn Will discuss plan with patient after he has seen Dr Aris Lot with [facility] re recent C- MRI and associated sx   Follow-up plan:   Return in about 6 weeks (around 04/27/2019) for Medication Management, virtual.     Diagnostic cervical facet medial branch nerve blocks Diagnostic thoracic facet medial branch nerve blocks Cervical and/or thoracic epidural steroid injection Cervical/trapezius trigger point injections SPG block      Recent Visits Date Type Provider Dept  01/28/19 Office Visit Gillis Santa, MD [facility] recent visits within past 90 days and meeting all other requirements   Today's Visits Date Type Provider Dept  03/16/19 Office Visit Gillis Santa, MD [facility]  Showing today's visits and meeting all other requirements   Future Appointments No visits were found meeting these conditions.  Showing future appointments within  next 90 days and meeting all other requirements   I discussed the assessment and treatment plan with the patient. The patient was provided an opportunity to ask questions and all were answered. The patient agreed with the plan and demonstrated an understanding of the instructions.  Patient advised to call back or seek an in-person evaluation if the symptoms or condition worsens.  Total duration of non-face-to-face encounter: 25 minutes.  Note by: Gillis Santa, MD Date: 03/16/2019; Time: 11:46 AM  Note: This dictation was prepared with Dragon dictation. Any transcriptional errors that may result from this process are unintentional.

## 2019-03-17 ENCOUNTER — Encounter: Payer: Self-pay | Admitting: Student in an Organized Health Care Education/Training Program

## 2019-04-09 DIAGNOSIS — R000 Tachycardia, unspecified: Secondary | ICD-10-CM | POA: Insufficient documentation

## 2019-04-17 ENCOUNTER — Other Ambulatory Visit: Payer: Self-pay | Admitting: Cardiology

## 2019-04-17 DIAGNOSIS — R079 Chest pain, unspecified: Secondary | ICD-10-CM

## 2019-04-22 ENCOUNTER — Telehealth: Payer: Self-pay

## 2019-04-22 ENCOUNTER — Encounter: Payer: Self-pay | Admitting: Student in an Organized Health Care Education/Training Program

## 2019-04-22 NOTE — Telephone Encounter (Signed)
Alma Friendly returned your call. You can call her back at [phone], it's her work number

## 2019-04-22 NOTE — Telephone Encounter (Signed)
Left message with Alma Friendly [phone] to call us back to go over medications prior to appointment Monday.

## 2019-04-27 ENCOUNTER — Other Ambulatory Visit: Payer: Self-pay

## 2019-04-27 ENCOUNTER — Encounter: Payer: Self-pay | Admitting: Student in an Organized Health Care Education/Training Program

## 2019-04-27 ENCOUNTER — Ambulatory Visit
Payer: 59 | Attending: Student in an Organized Health Care Education/Training Program | Admitting: Student in an Organized Health Care Education/Training Program

## 2019-04-27 DIAGNOSIS — M960 Pseudarthrosis after fusion or arthrodesis: Secondary | ICD-10-CM

## 2019-04-27 DIAGNOSIS — G894 Chronic pain syndrome: Secondary | ICD-10-CM

## 2019-04-27 DIAGNOSIS — M4722 Other spondylosis with radiculopathy, cervical region: Secondary | ICD-10-CM

## 2019-04-27 DIAGNOSIS — R110 Nausea: Secondary | ICD-10-CM

## 2019-04-27 DIAGNOSIS — M4712 Other spondylosis with myelopathy, cervical region: Secondary | ICD-10-CM

## 2019-04-27 DIAGNOSIS — Z981 Arthrodesis status: Secondary | ICD-10-CM | POA: Diagnosis not present

## 2019-04-27 DIAGNOSIS — M47812 Spondylosis without myelopathy or radiculopathy, cervical region: Secondary | ICD-10-CM

## 2019-04-27 DIAGNOSIS — M5412 Radiculopathy, cervical region: Secondary | ICD-10-CM

## 2019-04-27 DIAGNOSIS — G959 Disease of spinal cord, unspecified: Secondary | ICD-10-CM

## 2019-04-27 MED ORDER — HYDROCODONE-ACETAMINOPHEN 10-325 MG PO TABS: 1.0000 | ORAL_TABLET | Freq: Two times a day (BID) | ORAL | 0 refills | Status: DC | PRN

## 2019-04-27 MED ORDER — PREGABALIN 100 MG PO CAPS: 100.0000 mg | ORAL_CAPSULE | Freq: Three times a day (TID) | ORAL | 5 refills | Status: DC

## 2019-04-27 MED ORDER — TIZANIDINE HCL 4 MG PO TABS: 4.0000 mg | ORAL_TABLET | Freq: Three times a day (TID) | ORAL | 2 refills | Status: DC | PRN

## 2019-04-27 MED ORDER — ONDANSETRON HCL 4 MG PO TABS: 4.0000 mg | ORAL_TABLET | Freq: Every day | ORAL | 1 refills | Status: AC | PRN

## 2019-04-27 NOTE — Progress Notes (Signed)
Patient: Thomas Cole  Service Category: E/M  Provider: Gillis Santa, MD  DOB: [birth year]  DOS: 04/27/2019  Location: Office  MRN: [id number]  Setting: Ambulatory outpatient  Referring Provider: Derinda Late, MD  Type: Established Patient  Specialty: Interventional Pain Management  PCP: Derinda Late, MD  Location: Home  Delivery: TeleHealth     Virtual Encounter - Pain Management PROVIDER NOTE: Information contained herein reflects review and annotations entered in association with encounter. Interpretation of such information and data should be left to medically-trained personnel. Information provided to patient can be located elsewhere in the medical record under "Patient Instructions". Document created using STT-dictation technology, any transcriptional errors that may result from process are unintentional.    Contact & Pharmacy Preferred: [phone] Home: [phone] (home) Mobile: [phone] (mobile) E-mail: [email or web address]  [place] - [place], [place] - [address] [place] [address] Phone: [phone] Fax: [phone]   Pre-screening  Thomas Cole offered "in-person" vs "virtual" encounter. He indicated preferring virtual for this encounter.   Reason COVID-19*  Social distancing based on CDC and AMA recommendations.   I contacted Thomas Cole on 04/27/2019 via telephone.      I clearly identified myself as Gillis Santa, MD. I verified that I was speaking with the correct person using two identifiers (Name: Thomas Cole, and date of birth: 11-08-1970).  This visit was completed via telephone due to the restrictions of the COVID-19 pandemic. All issues as above were discussed and addressed but no physical exam was performed. If it was felt that the patient should be evaluated in the office, they were directed there. The patient verbally consented to this visit. Patient was unable to complete an audio/visual visit due to  Technical difficulties and/or Lack of internet. Due to the catastrophic nature of the COVID-19 pandemic, this visit was done through audio contact only.  Location of the patient: home address (see Epic for details)  Location of the provider: office Consent I sought verbal advanced consent from Thomas Cole for virtual visit interactions. I informed Thomas Cole of possible security and privacy concerns, risks, and limitations associated with providing "not-in-person" medical evaluation and management services. I also informed Thomas Cole of the availability of "in-person" appointments. Finally, I informed him that there would be a charge for the virtual visit and that he could be  personally, fully or partially, financially responsible for it. Thomas Cole expressed understanding and agreed to proceed.   Historic Elements   Thomas Cole is a 49 y.o. year old, male patient evaluated today after his last encounter by our practice on 04/22/2019. Thomas Cole  has a past medical history of Anxiety, Asthma, Back problem, Benign essential hypertension (11/30/2016), Cervical spondylosis without myelopathy ([id number]), Complication of anesthesia, Depression, GERD (gastroesophageal reflux disease), Hypertension, Labile hypertension (03/19/2013), Major depressive disorder, recurrent episode, severe ([place]) (06/23/2015), Migraine headache, Migraines (11/11/2013), S/P appendectomy (04/09/2011), Seizure ([place]) (11/30/2016), Seizures ([place]), Shortness of breath, and SOB (shortness of breath) (08/06/2014). He also  has a past surgical history that includes Knee surgery; Posterior fusion cervical spine; Cervical disc surgery; Appendectomy (12); Anterior cervical corpectomy (N/A, 09/15/2012); and Cholecystectomy (N/A, 03/29/2017). Thomas Cole has a current medication list which includes the following prescription(s): butalbital-acetaminophen-caffeine, carbidopa-levodopa, vitamin d3, clonazepam, duloxetine, entacapone, hydrochlorothiazide,  nortriptyline, omeprazole, ondansetron, phenytoin, polyethylene glycol powder, pregabalin, propranolol, tizanidine, vitamin b-12, [START ON 04/28/2019] hydrocodone-acetaminophen, [START ON 05/28/2019] hydrocodone-acetaminophen, losartan-hydrochlorothiazide, ondansetron, phenytoin, and valsartan. He  reports that he has never smoked. He has never used  smokeless tobacco. He reports that he does not drink alcohol or use drugs. Thomas Cole is allergic to amitriptyline; bee venom; chlorhexidine; carbamazepine; meloxicam; morphine and related; and sulfa antibiotics.   HPI  Today, he is being contacted for medication management.   Patient follows up today for medication management.  He is continuing to endorse severe in worsening neck pain as well as headaches.  Patient saw Dr. Aris Lot with neurosurgery on 03/31/2019 who did not recommend any surgical intervention at the time for his neck and shoulder pain.  Patient states that Dr. Aris Lot referred him to physiatry for injections however the referral process was very convoluted and difficult.  He is wondering if I can perform these injections for him.  In regards to medication management, patient states that the hydrocodone is suboptimally managing his pain.  He is currently on 5 mg twice daily.  We will increase dose to 10 mg twice daily as needed.  Also due to insurance change, he is limited in the amount of tizanidine that he can obtain.  Per his wife, she states that insurance has approved him for 90 tablets/month.  We will refill as below.  Patient will also need a prior authorization on his Lyrica which is 100 mg 3 times daily.  Will notify nursing staff.  Pharmacotherapy Assessment  Analgesic: 04/02/2019  2   03/16/2019  Hydrocodone-Acetamin 5-325 MG  60.00  30 Bi Lat   [id number]   Gib (4800)   0  10.00 MME  Comm Ins   [place]     Monitoring: Pharmacotherapy: No side-effects or adverse reactions reported. [place] PMP: PDMP not reviewed this encounter.        Compliance: No problems identified. Effectiveness: Clinically acceptable. but room to improve, will increase to 10 mg BID Plan: Refer to "POC".  UDS: No results found for: SUMMARY Laboratory Chemistry Profile (12 mo)  Renal: No results found for requested labs within last 8760 hours.  Lab Results  Component Value Date   GFR 82.19 08/05/2014   GFRAA >60 04/21/2017   GFRNONAA >60 04/21/2017   Hepatic: No results found for requested labs within last 8760 hours. Lab Results  Component Value Date   AST 30 04/21/2017   ALT 23 04/21/2017   Other: No results found for requested labs within last 8760 hours.  Note: Above Lab results reviewed.  Imaging  MR CERVICAL SPINE WO CONTRAST CLINICAL DATA:  49 year old male with prior cervical spine fusion. Myelopathy. Headaches and chronic neck pain.  EXAM: MRI CERVICAL SPINE WITHOUT CONTRAST  TECHNIQUE: Multiplanar, multisequence MR imaging of the cervical spine was performed. No intravenous contrast was administered.  COMPARISON:  [facility] cervical spine radiographs 07/09/2013. Cervical spine MRI 05/29/2011.  FINDINGS: Alignment: Stable since 2015 with straightening of cervical lordosis.  Vertebrae: Previous C3 through C7 ACDF, with fusion at both C3-C4 and C6-C7 since the 2013 MRI. Mild associated hardware susceptibility artifact. No marrow edema or evidence of acute osseous abnormality. Incidental benign vertebral body hemangioma at T2.  Cord: Chronic cervical spinal cord myelomalacia with severe cord atrophy at the C5 level (series 7, image 16), not significantly changed since 2013. Overall cord signal and volume appears stable since that time.  Posterior Fossa, vertebral arteries, paraspinal tissues: Cervicomedullary junction is within normal limits. Negative visible posterior fossa. Preserved major vascular flow voids in the neck, the left vertebral artery appears mildly dominant. Negative neck soft  tissues.  Disc levels:  C2-C3: Bulky hypertrophy of the posterior longitudinal ligament superimposed on increased  disc bulging at this level since 2013. Mild posterior element hypertrophy. Subsequent new mild spinal stenosis and mild spinal cord mass effect (series 7, image 5). Stable borderline to mild left C3 foraminal stenosis.  C3-C4:  Prior ACDF with solid arthrodesis.  C4-C5:  Chronic fusion with solid arthrodesis.  C5-C6:  Chronic fusion with solid arthrodesis.  C6-C7:  Prior ACDF with solid arthrodesis.  C7-T1: Mild to moderate facet hypertrophy greater on the right. Mild endplate spurring. No spinal stenosis. Mild left and mild to moderate right C8 foraminal stenosis appears increased since 2013.  Partially visible upper thoracic facet hypertrophy greater on the right. There is associated moderate to severe bilateral T1 foraminal stenosis which has increased since 2013.  IMPRESSION: 1. Chronic cervical spinal cord myelomalacia, severe at the C5 level, not significantly changed since 2013. 2. Solid-appearing fusion from the C3 to the C7 level. Adjacent segment disease at C2-C3 with bulky ligamentous hypertrophy and new mild spinal stenosis and mild spinal cord mass effect. Lesser adjacent segment disease at C7-T1 but also progressed since 2013 with mild-to-moderate C8 foraminal stenosis. 3. Upper thoracic facet hypertrophy, moderate to severe bilateral T1 foraminal stenosis.  Electronically Signed   By: Genevie Ann M.D.   On: 03/04/2019 01:24   Assessment  The primary encounter diagnosis was Cervical facet joint syndrome. Diagnoses of Spondylosis, cervical, with myelopathy, H/O cervical spinal arthrodesis (C3-C7 fusion), Myelopathy of cervical spinal cord with cervical radiculopathy, Failed cervical fusion, Chronic pain syndrome, and Nausea without vomiting were also pertinent to this visit.  Plan of Care   I have discontinued Yoltzin A. Danker's diazepam and  Melatonin. I have also changed his tiZANidine. Additionally, I am having him start on ondansetron, HYDROcodone-acetaminophen, and HYDROcodone-acetaminophen. Lastly, I am having him maintain his omeprazole, losartan-hydrochlorothiazide, phenytoin, polyethylene glycol powder, valsartan, hydrochlorothiazide, vitamin B-12, ondansetron, carbidopa-levodopa, DULoxetine, Vitamin D3, phenytoin, nortriptyline, butalbital-acetaminophen-caffeine, clonazePAM, entacapone, propranolol, and pregabalin.  1. Thomas Cole has a history of greater than 3 months of moderate to severe pain which is resulted in functional impairment.  The patient has tried various conservative therapeutic options such as NSAIDs, Tylenol, muscle relaxants, physical therapy which was inadequately effective.  Patient's pain is predominantly axial with findings suggestive of facet arthropathy. Cervical facet medial branch nerve blocks were discussed with the patient.  Risks and benefits were reviewed.  Patient would like to proceed with bilateral C3, C4, C5, C6, C7 medial branch nerve block.  2. In regards to medication management, refill Lyrica tizanidine as below.  We will increase hydrocodone to 10 mg twice daily as needed.  Prescription for Zofran as well as needed for nausea.  Pharmacotherapy (Medications Ordered): Meds ordered this encounter  Medications  . tiZANidine (ZANAFLEX) 4 MG tablet    Sig: Take 1 tablet (4 mg total) by mouth every 8 (eight) hours as needed for muscle spasms.    Dispense:  90 tablet    Refill:  2  . pregabalin (LYRICA) 100 MG capsule    Sig: Take 1 capsule (100 mg total) by mouth 3 (three) times daily.    Dispense:  90 capsule    Refill:  5    Do not place this medication, or any other prescription from our practice, on "Automatic Refill". Patient may have prescription filled one day early if pharmacy is closed on scheduled refill date.  . ondansetron (ZOFRAN) 4 MG tablet    Sig: Take 1 tablet (4 mg total) by  mouth daily as needed for nausea or vomiting.  Dispense:  30 tablet    Refill:  1  . HYDROcodone-acetaminophen (NORCO) 10-325 MG tablet    Sig: Take 1 tablet by mouth 2 (two) times daily as needed for severe pain. Must last 30 days.    Dispense:  60 tablet    Refill:  0    Chronic Pain. (STOP Act - Not applicable). Fill one day early if closed on scheduled refill date.  Marland Kitchen HYDROcodone-acetaminophen (NORCO) 10-325 MG tablet    Sig: Take 1 tablet by mouth 2 (two) times daily as needed for severe pain. Must last 30 days.    Dispense:  60 tablet    Refill:  0    Chronic Pain. (STOP Act - Not applicable). Fill one day early if closed on scheduled refill date.   Orders:  Orders Placed This Encounter  Procedures  . CERVICAL FACET (MEDIAL BRANCH NERVE BLOCK)     Standing Status:   Future    Standing Expiration Date:   05/28/2019    Scheduling Instructions:     Side: Bilateral     Level: C3-4, C4-5, C5-6 Facet joints (C3, C4, C5, C6, & C7 Medial Branch Nerves)     Sedation: With Sedation.     Timeframe: As soon as schedule allows    Order Specific Question:   Where will this procedure be performed?    Answer:   [facility]   Follow-up plan:   Return in about 2 weeks (around 05/11/2019) for C3-C7 facets with sedation; block 1 hr.     Diagnostic cervical facet medial branch nerve blocks Diagnostic thoracic facet medial branch nerve blocks Cervical and/or thoracic epidural steroid injection Cervical/trapezius trigger point injections SPG block       Recent Visits Date Type Provider Dept  03/16/19 Office Visit Gillis Santa, MD [facility]  01/28/19 Office Visit Gillis Santa, MD [facility]  Showing recent visits within past 90 days and meeting all other requirements   Today's Visits Date Type Provider Dept  04/27/19 Office Visit Gillis Santa, MD [facility]  Showing today's visits and meeting all other requirements   Future Appointments No  visits were found meeting these conditions.  Showing future appointments within next 90 days and meeting all other requirements   I discussed the assessment and treatment plan with the patient. The patient was provided an opportunity to ask questions and all were answered. The patient agreed with the plan and demonstrated an understanding of the instructions.  Patient advised to call back or seek an in-person evaluation if the symptoms or condition worsens.  Duration of encounter: 40 minutes.  Note by: Gillis Santa, MD Date: 04/27/2019; Time: 1:56 PM

## 2019-05-06 ENCOUNTER — Other Ambulatory Visit: Payer: Self-pay

## 2019-05-06 ENCOUNTER — Encounter: Payer: Self-pay | Admitting: Student in an Organized Health Care Education/Training Program

## 2019-05-06 ENCOUNTER — Ambulatory Visit
Admission: RE | Admit: 2019-05-06 | Discharge: 2019-05-06 | Disposition: A | Discharge status: Home / Self Care | Payer: 59 | Source: Ambulatory Visit | Attending: Student in an Organized Health Care Education/Training Program | Admitting: Student in an Organized Health Care Education/Training Program

## 2019-05-06 ENCOUNTER — Ambulatory Visit (HOSPITAL_BASED_OUTPATIENT_CLINIC_OR_DEPARTMENT_OTHER): Payer: 59 | Admitting: Student in an Organized Health Care Education/Training Program

## 2019-05-06 DIAGNOSIS — M47812 Spondylosis without myelopathy or radiculopathy, cervical region: Secondary | ICD-10-CM

## 2019-05-06 DIAGNOSIS — G894 Chronic pain syndrome: Secondary | ICD-10-CM

## 2019-05-06 MED ORDER — FENTANYL CITRATE (PF) 100 MCG/2ML IJ SOLN
INTRAMUSCULAR | Status: AC
  Filled 2019-05-06: qty 2

## 2019-05-06 MED ORDER — PREGABALIN 100 MG PO CAPS: 100.0000 mg | ORAL_CAPSULE | Freq: Three times a day (TID) | ORAL | 5 refills | Status: DC

## 2019-05-06 MED ORDER — ROPIVACAINE HCL 2 MG/ML IJ SOLN
INTRAMUSCULAR | Status: AC
  Filled 2019-05-06: qty 10

## 2019-05-06 MED ORDER — DEXAMETHASONE SODIUM PHOSPHATE 10 MG/ML IJ SOLN
10.0000 mg | Freq: Once | INTRAMUSCULAR | Status: AC
  Administered 2019-05-06: 10 mg

## 2019-05-06 MED ORDER — DEXAMETHASONE SODIUM PHOSPHATE 10 MG/ML IJ SOLN
INTRAMUSCULAR | Status: AC
  Filled 2019-05-06: qty 2

## 2019-05-06 MED ORDER — FENTANYL CITRATE (PF) 100 MCG/2ML IJ SOLN
25.0000 ug | INTRAMUSCULAR | Status: DC | PRN
  Administered 2019-05-06: 75 ug via INTRAVENOUS

## 2019-05-06 MED ORDER — ROPIVACAINE HCL 2 MG/ML IJ SOLN
9.0000 mL | Freq: Once | INTRAMUSCULAR | Status: AC
  Administered 2019-05-06: 10:00:00 10 mL via PERINEURAL

## 2019-05-06 MED ORDER — LIDOCAINE HCL 2 % IJ SOLN
20.0000 mL | Freq: Once | INTRAMUSCULAR | Status: AC
  Administered 2019-05-06: 400 mg

## 2019-05-06 MED ORDER — ROPIVACAINE HCL 2 MG/ML IJ SOLN
9.0000 mL | Freq: Once | INTRAMUSCULAR | Status: AC
  Administered 2019-05-06: 10 mL via PERINEURAL

## 2019-05-06 MED ORDER — MIDAZOLAM HCL 5 MG/5ML IJ SOLN
1.0000 mg | INTRAMUSCULAR | Status: DC | PRN
  Administered 2019-05-06: 1 mg via INTRAVENOUS
  Filled 2019-05-06: qty 5

## 2019-05-06 MED ORDER — LIDOCAINE HCL 2 % IJ SOLN
INTRAMUSCULAR | Status: AC
  Filled 2019-05-06: qty 20

## 2019-05-06 NOTE — Progress Notes (Signed)
Safety precautions to be maintained throughout the outpatient stay will include: orient to surroundings, keep bed in low position, maintain call bell within reach at all times, provide assistance with transfer out of bed and ambulation.  

## 2019-05-06 NOTE — Progress Notes (Signed)
PROVIDER NOTE: Information contained herein reflects review and annotations entered in association with encounter. Interpretation of such information and data should be left to medically-trained personnel. Information provided to patient can be located elsewhere in the medical record under "Patient Instructions". Document created using STT-dictation technology, any transcriptional errors that may result from process are unintentional.    Patient: Thomas Cole  Service Category: Procedure  Provider: Gillis Santa, MD  DOB: 1970-04-15  DOS: 05/06/2019  Location: [facility]  MRN: [id number]  Setting: Ambulatory - outpatient  Referring Provider: Gillis Santa, MD  Type: Established Patient  Specialty: Interventional Pain Management  PCP: Derinda Late, MD   Primary Reason for Visit: Interventional Pain Management Treatment. CC: Neck Pain  Procedure:          Anesthesia, Analgesia, Anxiolysis:  Type: Cervical Facet Medial Branch Block(s)  #1  Primary Purpose: Diagnostic Region: Posterolateral cervical spine Level: C3, C4, C5, C6, & C7 Medial Branch Level(s). Injecting these levels blocks the C3-4, C4-5, C5-6, and C6-7 cervical facet joints. Laterality: Bilateral  Type: Moderate (Conscious) Sedation combined with Local Anesthesia Indication(s): Analgesia and Anxiety Route: Intravenous (IV) IV Access: Secured Sedation: Meaningful verbal contact was maintained at all times during the procedure  Local Anesthetic: Lidocaine 1-2%  Position: Prone with head of the table raised to facilitate breathing.   Indications: 1. Cervical facet joint syndrome   2. Chronic pain syndrome    Pain Score: Pre-procedure: 8 /10 Post-procedure: 0-No pain/10   Pre-op Assessment:  Thomas Cole is a 49 y.o. (year old), male patient, seen today for interventional treatment. He  has a past surgical history that includes Knee surgery; Posterior fusion cervical spine; Cervical disc surgery; Appendectomy  (12); Anterior cervical corpectomy (N/A, 09/15/2012); and Cholecystectomy (N/A, 03/29/2017). [name] has a current medication list which includes the following prescription(s): butalbital-acetaminophen-caffeine, carbidopa-levodopa, vitamin d3, clonazepam, duloxetine, entacapone, hydrochlorothiazide, hydrocodone-acetaminophen, [START ON 05/28/2019] hydrocodone-acetaminophen, nortriptyline, omeprazole, ondansetron, ondansetron, phenytoin, phenytoin, pregabalin, propranolol, tizanidine, valsartan, vitamin b-12, losartan-hydrochlorothiazide, and polyethylene glycol powder, and the following Facility-Administered Medications: fentanyl and midazolam. His primarily concern today is the Neck Pain  Initial Vital Signs:  Pulse/HCG Rate: 91ECG Heart Rate: 92 Temp: (!) 97.4 F (36.3 C) Resp: 17 BP: (!) 140/92 SpO2: 100 %  BMI: Estimated body mass index is 38.52 kg/m as calculated from the following:   Height as of this encounter: 6\' 2"  (1.88 m).   Weight as of this encounter: 300 lb (136.1 kg).  Risk Assessment: Allergies: Reviewed. He is allergic to amitriptyline; bee venom; chlorhexidine; carbamazepine; meloxicam; morphine and related; and sulfa antibiotics.  Allergy Precautions: None required Coagulopathies: Reviewed. None identified.  Blood-thinner therapy: None at this time Active Infection(s): Reviewed. None identified. [name] is afebrile  Site Confirmation: Mr. Salom was asked to confirm the procedure and laterality before marking the site Procedure checklist: Completed Consent: Before the procedure and under the influence of no sedative(s), amnesic(s), or anxiolytics, the patient was informed of the treatment options, risks and possible complications. To fulfill our ethical and legal obligations, as recommended by the American Medical Association's Code of Ethics, I have informed the patient of my clinical impression; the nature and purpose of the treatment or procedure; the risks,  benefits, and possible complications of the intervention; the alternatives, including doing nothing; the risk(s) and benefit(s) of the alternative treatment(s) or procedure(s); and the risk(s) and benefit(s) of doing nothing. The patient was provided information about the general risks and possible complications associated with the procedure. These  may include, but are not limited to: failure to achieve desired goals, infection, bleeding, organ or nerve damage, allergic reactions, paralysis, and death. In addition, the patient was informed of those risks and complications associated to Spine-related procedures, such as failure to decrease pain; infection (i.e.: Meningitis, epidural or intraspinal abscess); bleeding (i.e.: epidural hematoma, subarachnoid hemorrhage, or any other type of intraspinal or peri-dural bleeding); organ or nerve damage (i.e.: Any type of peripheral nerve, nerve root, or spinal cord injury) with subsequent damage to sensory, motor, and/or autonomic systems, resulting in permanent pain, numbness, and/or weakness of one or several areas of the body; allergic reactions; (i.e.: anaphylactic reaction); and/or death. Furthermore, the patient was informed of those risks and complications associated with the medications. These include, but are not limited to: allergic reactions (i.e.: anaphylactic or anaphylactoid reaction(s)); adrenal axis suppression; blood sugar elevation that in diabetics may result in ketoacidosis or comma; water retention that in patients with history of congestive heart failure may result in shortness of breath, pulmonary edema, and decompensation with resultant heart failure; weight gain; swelling or edema; medication-induced neural toxicity; particulate matter embolism and blood vessel occlusion with resultant organ, and/or nervous system infarction; and/or aseptic necrosis of one or more joints. Finally, the patient was informed that Medicine is not an exact science;  therefore, there is also the possibility of unforeseen or unpredictable risks and/or possible complications that may result in a catastrophic outcome. The patient indicated having understood very clearly. We have given the patient no guarantees and we have made no promises. Enough time was given to the patient to ask questions, all of which were answered to the patient's satisfaction. Mr. Hogrefe has indicated that he wanted to continue with the procedure. Attestation: I, the ordering provider, attest that I have discussed with the patient the benefits, risks, side-effects, alternatives, likelihood of achieving goals, and potential problems during recovery for the procedure that I have provided informed consent. Date  Time: 05/06/2019  9:04 AM  Pre-Procedure Preparation:  Monitoring: As per clinic protocol. Respiration, ETCO2, SpO2, BP, heart rate and rhythm monitor placed and checked for adequate function Safety Precautions: Patient was assessed for positional comfort and pressure points before starting the procedure. Time-out: I initiated and conducted the "Time-out" before starting the procedure, as per protocol. The patient was asked to participate by confirming the accuracy of the "Time Out" information. Verification of the correct person, site, and procedure were performed and confirmed by me, the nursing staff, and the patient. "Time-out" conducted as per Joint Commission's Universal Protocol (UP.01.01.01). Time: 1013  Description of Procedure:          Laterality: Bilateral. The procedure was performed in identical fashion on both sides. Level: C3, C4, C5, C6, & C7 Medial Branch Level(s). Area Prepped: Posterior Cervico-thoracic Region Prepping solution: DuraPrep (Iodine Povacrylex [0.7% available iodine] and Isopropyl Alcohol, 74% w/w) Safety Precautions: Aspiration looking for blood return was conducted prior to all injections. At no point did we inject any substances, as a needle was being  advanced. Before injecting, the patient was told to immediately notify me if he was experiencing any new onset of "ringing in the ears, or metallic taste in the mouth". No attempts were made at seeking any paresthesias. Safe injection practices and needle disposal techniques used. Medications properly checked for expiration dates. SDV (single dose vial) medications used. After the completion of the procedure, all disposable equipment used was discarded in the proper designated medical waste containers. Local Anesthesia: Protocol guidelines were followed.  The patient was positioned over the fluoroscopy table. The area was prepped in the usual manner. The time-out was completed. The target area was identified using fluoroscopy. A 12-in long, straight, sterile hemostat was used with fluoroscopic guidance to locate the targets for each level blocked. Once located, the skin was marked with an approved surgical skin marker. Once all sites were marked, the skin (epidermis, dermis, and hypodermis), as well as deeper tissues (fat, connective tissue and muscle) were infiltrated with a small amount of a short-acting local anesthetic, loaded on a 10cc syringe with a 25G, 1.5-in  Needle. An appropriate amount of time was allowed for local anesthetics to take effect before proceeding to the next step. Local Anesthetic: Lidocaine 2.0% The unused portion of the local anesthetic was discarded in the proper designated containers. Technical explanation of process:  C3 Medial Branch Nerve Block (MBB): The target area for the C3 dorsal medial articular branch is the lateral concave waist of the articular pillar of C3. Under fluoroscopic guidance, a Quincke needle was inserted until contact was made with os over the postero-lateral aspect of the articular pillar of C3 (target area). After negative aspiration for blood, 47mL of the nerve block solution was injected without difficulty or complication. The needle was removed intact. C4  Medial Branch Nerve Block (MBB): The target area for the C4 dorsal medial articular branch is the lateral concave waist of the articular pillar of C4. Under fluoroscopic guidance, a Quincke needle was inserted until contact was made with os over the postero-lateral aspect of the articular pillar of C4 (target area). After negative aspiration for blood, 87mL of the nerve block solution was injected without difficulty or complication. The needle was removed intact. C5 Medial Branch Nerve Block (MBB): The target area for the C5 dorsal medial articular branch is the lateral concave waist of the articular pillar of C5. Under fluoroscopic guidance, a Quincke needle was inserted until contact was made with os over the postero-lateral aspect of the articular pillar of C5 (target area). After negative aspiration for blood, 1 mL of the nerve block solution was injected without difficulty or complication. The needle was removed intact. C6 Medial Branch Nerve Block (MBB): The target area for the C6 dorsal medial articular branch is the lateral concave waist of the articular pillar of C6. Under fluoroscopic guidance, a Quincke needle was inserted until contact was made with os over the postero-lateral aspect of the articular pillar of C6 (target area). After negative aspiration for blood, 7mL of the nerve block solution was injected without difficulty or complication. The needle was removed intact. C7 Medial Branch Nerve Block (MBB): The target for the C7 dorsal medial articular branch lies on the superior-medial tip of the C7 transverse process. Under fluoroscopic guidance, a Quincke needle was inserted until contact was made with os over the postero-lateral aspect of the articular pillar of C7 (target area). After negative aspiration for blood, 37mL of the nerve block solution was injected without difficulty or complication. The needle was removed intact. Procedural Needles: 22-gauge, 3.5-inch, Quincke needles used for all  levels. Nerve block solution: 10 cc solution made of 8 cc of 0.2% ropivacaine, 2 cc of Decadron 10 mg/cc.  1 cc injected at each level above bilaterally.    Once the entire procedure was completed, the treated area was cleaned, making sure to leave some of the prepping solution back to take advantage of its long term bactericidal properties.  Anatomy Reference Guide:  Vitals:   05/06/19 1038 05/06/19 1042 05/06/19 1052 05/06/19 1103  BP: 140/90 127/71 126/71 (!) 117/56  Pulse:      Resp: 11 18 16 16   Temp:  97.6 F (36.4 C)    SpO2: 97% 97% 100% 100%  Weight:      Height:        Start Time: 1013 hrs. End Time: 1037 hrs.  Imaging Guidance (Spinal):          Type of Imaging Technique: Fluoroscopy Guidance (Spinal) Indication(s): Assistance in needle guidance and placement for procedures requiring needle placement in or near specific anatomical locations not easily accessible without such assistance. Exposure Time: Please see nurses notes. Contrast: None used. Fluoroscopic Guidance: I was personally present during the use of fluoroscopy. "Tunnel Vision Technique" used to obtain the best possible view of the target area. Parallax error corrected before commencing the procedure. "Direction-depth-direction" technique used to introduce the needle under continuous pulsed fluoroscopy. Once target was reached, antero-posterior, oblique, and lateral fluoroscopic projection used confirm needle placement in all planes. Images permanently stored in EMR. Interpretation: No contrast injected. I personally interpreted the imaging intraoperatively. Adequate needle placement confirmed in multiple planes. Permanent images saved into the patient's record.  Antibiotic Prophylaxis:   Anti-infectives (From admission, onward)   None     Indication(s): None identified  Post-operative Assessment:  Post-procedure Vital Signs:  Pulse/HCG Rate: 9189 Temp: 97.6 F (36.4 C) Resp: 16 BP: (!)  117/56 SpO2: 100 %  EBL: None  Complications: No immediate post-treatment complications observed by team, or reported by patient.  Note: The patient tolerated the entire procedure well. A repeat set of vitals were taken after the procedure and the patient was kept under observation following institutional policy, for this type of procedure. Post-procedural neurological assessment was performed, showing return to baseline, prior to discharge. The patient was provided with post-procedure discharge instructions, including a section on how to identify potential problems. Should any problems arise concerning this procedure, the patient was given instructions to immediately contact us, at any time, without hesitation. In any case, we plan to contact the patient by telephone for a follow-up status report regarding this interventional procedure.  Comments:  No additional relevant information.  Plan of Care  Orders:  Orders Placed This Encounter  Procedures  . [facility] PA[facility] C-ARM 1-60 MIN NO REPORT    Intraoperative interpretation by procedural physician at [facility].    Standing Status:   Standing    Number of Occurrences:   1    Order Specific Question:   Reason for exam:    Answer:   Assistance in needle guidance and placement for procedures requiring needle placement in or near specific anatomical locations not easily accessible without such assistance.   Patient has been on Lyrica for many months.  It does provide him with analgesic benefit.  It is unclear why the insurance did not approve his Lyrica.  He has tried and failed gabapentin in the past.  This resulted in side effects of cognitive distortion.  He is currently on Klonopin as well as Cymbalta.  It is my recommendation for the patient to continue his Lyrica 100 mg 3 times a day.  Prescription provided below.   Medications ordered for procedure: Meds ordered this encounter  Medications  . pregabalin (LYRICA) 100 MG  capsule    Sig: Take 1 capsule (100 mg total) by mouth 3 (three) times daily.    Dispense:  90 capsule  Refill:  5    Do not place this medication, or any other prescription from our practice, on "Automatic Refill". Patient may have prescription filled one day early if pharmacy is closed on scheduled refill date.  . lidocaine (XYLOCAINE) 2 % (with pres) injection 400 mg  . midazolam (VERSED) 5 MG/5ML injection 1-2 mg    Make sure Flumazenil is available in the pyxis when using this medication. If oversedation occurs, administer 0.2 mg IV over 15 sec. If after 45 sec no response, administer 0.2 mg again over 1 min; may repeat at 1 min intervals; not to exceed 4 doses (1 mg)  . fentaNYL (SUBLIMAZE) injection 25-50 mcg    Make sure Narcan is available in the pyxis when using this medication. In the event of respiratory depression (RR< 8/min): Titrate NARCAN (naloxone) in increments of 0.1 to 0.2 mg IV at 2-3 minute intervals, until desired degree of reversal.  . ropivacaine (PF) 2 mg/mL (0.2%) (NAROPIN) injection 9 mL  . ropivacaine (PF) 2 mg/mL (0.2%) (NAROPIN) injection 9 mL  . dexamethasone (DECADRON) injection 10 mg  . dexamethasone (DECADRON) injection 10 mg   Medications administered: We administered lidocaine, midazolam, fentaNYL, ropivacaine (PF) 2 mg/mL (0.2%), ropivacaine (PF) 2 mg/mL (0.2%), dexamethasone, and dexamethasone.  See the medical record for exact dosing, route, and time of administration.  Follow-up plan:   Return in about 4 weeks (around 06/03/2019) for Post Procedure Evaluation, virtual.      Diagnostic cervical facet medial branch nerve blocks C3-C7 b/l 05/06/19 Diagnostic thoracic facet medial branch nerve blocks Cervical and/or thoracic epidural steroid injection Cervical/trapezius trigger point injections SPG block        Recent Visits Date Type Provider Dept  04/27/19 Office Visit Gillis Santa, MD [facility]  03/16/19 Office Visit Gillis Santa, MD  [facility]  Showing recent visits within past 90 days and meeting all other requirements   Today's Visits Date Type Provider Dept  05/06/19 Procedure visit Gillis Santa, MD [facility]  Showing today's visits and meeting all other requirements   Future Appointments Date Type Provider Dept  06/03/19 Appointment Gillis Santa, MD [facility]  06/22/19 Appointment Gillis Santa, MD [facility]  Showing future appointments within next 90 days and meeting all other requirements   Disposition: Discharge home  Discharge (Date  Time): 05/06/2019; 1104 hrs.   Primary Care Physician: Derinda Late, MD Location: [facility] Note by: Gillis Santa, MD Date: 05/06/2019; Time: 12:13 PM  Disclaimer:  Medicine is not an exact science. The only guarantee in medicine is that nothing is guaranteed. It is important to note that the decision to proceed with this intervention was based on the information collected from the patient. The Data and conclusions were drawn from the patient's questionnaire, the interview, and the physical examination. Because the information was provided in large part by the patient, it cannot be guaranteed that it has not been purposely or unconsciously manipulated. Every effort has been made to obtain as much relevant data as possible for this evaluation. It is important to note that the conclusions that lead to this procedure are derived in large part from the available data. Always take into account that the treatment will also be dependent on availability of resources and existing treatment guidelines, considered by other Pain Management Practitioners as being common knowledge and practice, at the time of the intervention. For Medico-Legal purposes, it is also important to point out that variation in procedural  techniques and pharmacological choices are the acceptable norm. The indications, contraindications,  technique, and results of the above procedure should only be interpreted and judged by a Board-Certified Interventional Pain Specialist with extensive familiarity and expertise in the same exact procedure and technique.

## 2019-05-06 NOTE — Patient Instructions (Signed)

## 2019-05-07 ENCOUNTER — Telehealth: Payer: Self-pay

## 2019-05-07 NOTE — Telephone Encounter (Signed)
Post procedure phone call.  Patient states she is doing ok.  

## 2019-05-11 ENCOUNTER — Other Ambulatory Visit: Payer: Self-pay

## 2019-05-11 ENCOUNTER — Encounter
Admission: RE | Admit: 2019-05-11 | Discharge: 2019-05-11 | Disposition: A | Discharge status: Home / Self Care | Payer: 59 | Source: Ambulatory Visit | Attending: Cardiology | Admitting: Cardiology

## 2019-05-11 DIAGNOSIS — R079 Chest pain, unspecified: Secondary | ICD-10-CM | POA: Diagnosis not present

## 2019-05-11 LAB — NM MYOCAR MULTI W/SPECT W/WALL MOTION / EF
Estimated workload: 1 METS
Exercise duration (min): 1 min
Exercise duration (sec): 0 s
LV dias vol: 119 mL (ref 62–150)
LV sys vol: 60 mL
Peak HR: 109 {beats}/min
Percent HR: 63 %
Rest HR: 90 {beats}/min
SDS: 3
SRS: 1
SSS: 3
TID: 0.93

## 2019-05-11 MED ORDER — REGADENOSON 0.4 MG/5ML IV SOLN
0.4000 mg | Freq: Once | INTRAVENOUS | Status: AC
  Administered 2019-05-11: 0.4 mg via INTRAVENOUS
  Filled 2019-05-11: qty 5

## 2019-05-11 MED ORDER — TECHNETIUM TC 99M TETROFOSMIN IV KIT
10.0000 | PACK | Freq: Once | INTRAVENOUS | Status: AC | PRN
  Administered 2019-05-11: 10.8 via INTRAVENOUS

## 2019-05-11 MED ORDER — TECHNETIUM TC 99M TETROFOSMIN IV KIT
32.8300 | PACK | Freq: Once | INTRAVENOUS | Status: AC | PRN
  Administered 2019-05-11: 32.83 via INTRAVENOUS

## 2019-05-11 MED ORDER — NITROGLYCERIN 0.4 MG SL SUBL
0.4000 mg | SUBLINGUAL_TABLET | SUBLINGUAL | Status: DC | PRN
  Filled 2019-05-11: qty 25

## 2019-06-03 ENCOUNTER — Ambulatory Visit: Payer: 59 | Admitting: Student in an Organized Health Care Education/Training Program

## 2019-06-03 ENCOUNTER — Other Ambulatory Visit: Payer: Self-pay | Admitting: Student in an Organized Health Care Education/Training Program

## 2019-06-03 DIAGNOSIS — G894 Chronic pain syndrome: Secondary | ICD-10-CM

## 2019-06-17 ENCOUNTER — Encounter: Payer: Self-pay | Admitting: Student in an Organized Health Care Education/Training Program

## 2019-06-22 ENCOUNTER — Encounter: Payer: Self-pay | Admitting: Student in an Organized Health Care Education/Training Program

## 2019-06-22 ENCOUNTER — Ambulatory Visit
Payer: 59 | Attending: Student in an Organized Health Care Education/Training Program | Admitting: Student in an Organized Health Care Education/Training Program

## 2019-06-22 ENCOUNTER — Other Ambulatory Visit: Payer: Self-pay

## 2019-06-22 DIAGNOSIS — M47812 Spondylosis without myelopathy or radiculopathy, cervical region: Secondary | ICD-10-CM

## 2019-06-22 DIAGNOSIS — G894 Chronic pain syndrome: Secondary | ICD-10-CM | POA: Diagnosis not present

## 2019-06-22 DIAGNOSIS — Z981 Arthrodesis status: Secondary | ICD-10-CM | POA: Diagnosis not present

## 2019-06-22 DIAGNOSIS — M4712 Other spondylosis with myelopathy, cervical region: Secondary | ICD-10-CM | POA: Diagnosis not present

## 2019-06-22 DIAGNOSIS — M960 Pseudarthrosis after fusion or arthrodesis: Secondary | ICD-10-CM | POA: Diagnosis not present

## 2019-06-22 DIAGNOSIS — M5412 Radiculopathy, cervical region: Secondary | ICD-10-CM

## 2019-06-22 DIAGNOSIS — G959 Disease of spinal cord, unspecified: Secondary | ICD-10-CM

## 2019-06-22 DIAGNOSIS — F4310 Post-traumatic stress disorder, unspecified: Secondary | ICD-10-CM

## 2019-06-22 DIAGNOSIS — F411 Generalized anxiety disorder: Secondary | ICD-10-CM

## 2019-06-22 MED ORDER — HYDROCODONE-ACETAMINOPHEN 10-325 MG PO TABS: 1.0000 | ORAL_TABLET | Freq: Two times a day (BID) | ORAL | 0 refills | Status: DC | PRN

## 2019-06-22 MED ORDER — BUPRENORPHINE 7.5 MCG/HR TD PTWK: 7.5000 ug/h | MEDICATED_PATCH | TRANSDERMAL | 0 refills | Status: DC

## 2019-06-22 NOTE — Progress Notes (Signed)
Patient: Thomas Cole  Service Category: E/M  Provider: Gillis Santa, MD  DOB: [birth year]  DOS: 06/22/2019  Location: Office  MRN: [id number]  Setting: Ambulatory outpatient  Referring Provider: Derinda Late, MD  Type: Established Patient  Specialty: Interventional Pain Management  PCP: Derinda Late, MD  Location: Home  Delivery: TeleHealth     Virtual Encounter - Pain Management PROVIDER NOTE: Information contained herein reflects review and annotations entered in association with encounter. Interpretation of such information and data should be left to medically-trained personnel. Information provided to patient can be located elsewhere in the medical record under "Patient Instructions". Document created using STT-dictation technology, any transcriptional errors that may result from process are unintentional.    Contact & Pharmacy Preferred: [phone] Home: [phone] (home) Mobile: [phone] (mobile) E-mail: [email or web address]  [place], [place] - [address] [place] [address] Phone: [phone] Fax: [phone]   Pre-screening  Thomas Cole offered "in-person" vs "virtual" encounter. He indicated preferring virtual for this encounter.   Reason COVID-19*  Social distancing based on CDC and AMA recommendations.   I contacted Thomas Cole on 06/22/2019 via telephone.      I clearly identified myself as Gillis Santa, MD. I verified that I was speaking with the correct person using two identifiers (Name: Thomas Cole, and date of birth: 1971-02-14).  This visit was completed via telephone due to the restrictions of the COVID-19 pandemic. All issues as above were discussed and addressed but no physical exam was performed. If it was felt that the patient should be evaluated in the office, they were directed there. The patient verbally consented to this visit. Patient was unable to complete an audio/visual visit due to  Technical difficulties and/or Lack of internet. Due to the catastrophic nature of the COVID-19 pandemic, this visit was done through audio contact only.  Location of the patient: home address (see Epic for details)  Location of the provider: office Consent I sought verbal advanced consent from Thomas Cole for virtual visit interactions. I informed Thomas Cole of possible security and privacy concerns, risks, and limitations associated with providing "not-in-person" medical evaluation and management services. I also informed Thomas Cole of the availability of "in-person" appointments. Finally, I informed him that there would be a charge for the virtual visit and that he could be  personally, fully or partially, financially responsible for it. Thomas Cole expressed understanding and agreed to proceed.   Historic Elements   Thomas Cole is a 49 y.o. year old, male patient evaluated today after his last contact with our practice on 06/03/2019. Thomas Cole  has a past medical history of Anxiety, Asthma, Back problem, Benign essential hypertension (11/30/2016), Cervical spondylosis without myelopathy (5/32/9924), Complication of anesthesia, Depression, GERD (gastroesophageal reflux disease), Hypertension, Labile hypertension (03/19/2013), Major depressive disorder, recurrent episode, severe ([place]) (06/23/2015), Migraine headache, Migraines (11/11/2013), S/P appendectomy (04/09/2011), Seizure ([place]) (11/30/2016), Seizures ([place]), Shortness of breath, and SOB (shortness of breath) (08/06/2014). He also  has a past surgical history that includes Knee surgery; Posterior fusion cervical spine; Cervical disc surgery; Appendectomy (12); Anterior cervical corpectomy (N/A, 09/15/2012); and Cholecystectomy (N/A, 03/29/2017). Thomas Cole has a current medication list which includes the following prescription(s): butalbital-acetaminophen-caffeine, carbidopa-levodopa, vitamin d3, clonazepam, duloxetine, entacapone, hydrochlorothiazide,  [START ON 07/08/2019] hydrocodone-acetaminophen, losartan-hydrochlorothiazide, nortriptyline, omeprazole, ondansetron, ondansetron, phenytoin, polyethylene glycol powder, pregabalin, propranolol, tizanidine, valsartan, vitamin b-12, buprenorphine, omeprazole, and phenytoin. He  reports that he has never smoked. He has never used smokeless tobacco.  He reports that he does not drink alcohol or use drugs. Thomas Cole is allergic to amitriptyline; bee venom; chlorhexidine; carbamazepine; meloxicam; morphine and related; and sulfa antibiotics.   HPI  Today, he is being contacted for both, medication management and a post-procedure assessment.   Post-procedural assessment after cervical facet medial branch nerve blocks which were not effective.  Do not recommend repeating.  Patient is not finding benefit with hydrocodone for his baseline chronic pain.  He states that his chronic baseline pain is becoming harder to manage.  He has tried tramadol, oxycodone, hydrocodone in the past.  We discussed adding a medication to help with his chronic baseline pain.  Patient denies having tried buprenorphine.  Recommend Butrans patch for chronic pain management will continue hydrocodone as prescribed for breakthrough pain.  Pharmacotherapy Assessment  Analgesic: 06/08/2019  2   04/27/2019  Hydrocodone-Acetamin 10-325 MG  60.00  30 Bi Lat   [id number]   Gib (4800)   0  20.00 MME  Comm Ins   [place]     Monitoring:  PMP: PDMP reviewed during this encounter.       Pharmacotherapy: No side-effects or adverse reactions reported. Compliance: No problems identified. Effectiveness: Clinically acceptable. Plan: Refer to "POC".  UDS: No results found for: SUMMARY Laboratory Chemistry Profile   Renal Lab Results  Component Value Date   BUN 13 04/21/2017   CREATININE 0.93 04/21/2017   LABCREA 79.3 03/05/2014   GFR 82.19 08/05/2014   GFRAA >60 04/21/2017   GFRNONAA >60 04/21/2017    Hepatic Lab Results  Component Value  Date   AST 30 04/21/2017   ALT 23 04/21/2017   ALBUMIN 4.2 04/21/2017   ALKPHOS 86 04/21/2017   LIPASE 51 04/21/2017    Electrolytes Lab Results  Component Value Date   NA 138 04/21/2017   K 3.7 04/21/2017   CL 101 04/21/2017   CALCIUM 9.4 04/21/2017    Bone Lab Results  Component Value Date   VD25OH 31.95 03/05/2016    Inflammation (CRP: Acute Phase) (ESR: Chronic Phase) Lab Results  Component Value Date   CRP 0.3 (L) 04/29/2017   ESRSEDRATE 2 04/29/2017      Note: Above Lab results reviewed.  Imaging  NM Myocar Multi W/Spect W/Wall Motion / EF  This is a low risk study.  The left ventricular ejection fraction is moderately decreased (30-44%).  The study is normal.  Blood pressure demonstrated a normal response to exercise.  There was no ST segment deviation noted during stress.    Assessment  The primary encounter diagnosis was Cervical facet joint syndrome. Diagnoses of Chronic pain syndrome, Spondylosis, cervical, with myelopathy, H/O cervical spinal arthrodesis (C3-C7 fusion), Myelopathy of cervical spinal cord with cervical radiculopathy, Failed cervical fusion, Generalized anxiety disorder, and PTSD (post-traumatic stress disorder) were also pertinent to this visit.  Plan of Care   Mr. JERE BOSTROM has a current medication list which includes the following long-term medication(s): carbidopa-levodopa, clonazepam, entacapone, losartan-hydrochlorothiazide, nortriptyline, omeprazole, phenytoin, pregabalin, propranolol, valsartan, omeprazole, and phenytoin.  Pharmacotherapy (Medications Ordered): Meds ordered this encounter  Medications  . Buprenorphine 7.5 MCG/HR PTWK    Sig: Place 7.5 mcg/hr onto the skin every 7 (seven) days.    Dispense:  4 patch    Refill:  0    Patient may have prescription filled one day early if pharmacy is closed on scheduled refill date. For chronic pain syndrome  . HYDROcodone-acetaminophen (NORCO) 10-325 MG tablet    Sig: Take 1  tablet  by mouth 2 (two) times daily as needed for severe pain. Must last 30 days.    Dispense:  60 tablet    Refill:  0    Chronic Pain. (STOP Act - Not applicable). Fill one day early if closed on scheduled refill date.    Follow-up plan:   Return in about 4 weeks (around 07/20/2019) for Medication Management, virtual.     Diagnostic cervical facet medial branch nerve blocks C3-C7 b/l 05/06/19- did not help Diagnostic thoracic facet medial branch nerve blocks Cervical and/or thoracic epidural steroid injection Cervical/trapezius trigger point injections SPG block         Recent Visits Date Type Provider Dept  05/06/19 Procedure visit Gillis Santa, MD [facility]  04/27/19 Office Visit Gillis Santa, MD [facility]  Showing recent visits within past 90 days and meeting all other requirements   Today's Visits Date Type Provider Dept  06/22/19 Office Visit Gillis Santa, MD [facility]  Showing today's visits and meeting all other requirements   Future Appointments No visits were found meeting these conditions.  Showing future appointments within next 90 days and meeting all other requirements   I discussed the assessment and treatment plan with the patient. The patient was provided an opportunity to ask questions and all were answered. The patient agreed with the plan and demonstrated an understanding of the instructions.  Patient advised to call back or seek an in-person evaluation if the symptoms or condition worsens.  Duration of encounter: 25 minutes.  Note by: Gillis Santa, MD Date: 06/22/2019; Time: 2:37 PM

## 2019-07-02 ENCOUNTER — Other Ambulatory Visit: Payer: Self-pay | Admitting: Student in an Organized Health Care Education/Training Program

## 2019-07-02 DIAGNOSIS — R110 Nausea: Secondary | ICD-10-CM

## 2019-07-03 ENCOUNTER — Telehealth: Payer: Self-pay | Admitting: Student in an Organized Health Care Education/Training Program

## 2019-07-03 ENCOUNTER — Other Ambulatory Visit: Payer: Self-pay | Admitting: Student in an Organized Health Care Education/Training Program

## 2019-07-03 ENCOUNTER — Telehealth: Payer: Self-pay | Admitting: *Deleted

## 2019-07-03 DIAGNOSIS — G894 Chronic pain syndrome: Secondary | ICD-10-CM

## 2019-07-03 MED ORDER — BUPRENORPHINE 7.5 MCG/HR TD PTWK: 1.0000 | MEDICATED_PATCH | TRANSDERMAL | 0 refills | Status: DC

## 2019-07-03 NOTE — Telephone Encounter (Signed)
Pharmacy does not have meds to fill this script [facility] on [address] does have the meds and [facility] has contacted and gave patient information to them. They need a script sent in by phys. Patient needs meds today.

## 2019-07-03 NOTE — Telephone Encounter (Signed)
Have communicated with Dr Holley Raring and he is going to send in [place] patches to [facility] on [address] since [facility] did not have them in stock. I have also let Gerald Stabs at [facility] know this information as well.

## 2019-07-03 NOTE — Telephone Encounter (Signed)
Called to [facility] re; phone message.  Spoke with Gerald Stabs and he does confirm that they do not have the Butrans patches and they have communicated with [facility] on [address] and they do have those in stock.  I told him I would communicate with Dr Holley Raring, as he is not in the office today and would let him know what he says.

## 2019-07-14 ENCOUNTER — Ambulatory Visit
Payer: 59 | Attending: Student in an Organized Health Care Education/Training Program | Admitting: Student in an Organized Health Care Education/Training Program

## 2019-07-14 ENCOUNTER — Other Ambulatory Visit: Payer: Self-pay

## 2019-07-14 ENCOUNTER — Telehealth: Payer: Self-pay | Admitting: *Deleted

## 2019-07-14 ENCOUNTER — Encounter: Payer: Self-pay | Admitting: Student in an Organized Health Care Education/Training Program

## 2019-07-14 DIAGNOSIS — F411 Generalized anxiety disorder: Secondary | ICD-10-CM

## 2019-07-14 DIAGNOSIS — G959 Disease of spinal cord, unspecified: Secondary | ICD-10-CM

## 2019-07-14 DIAGNOSIS — M4712 Other spondylosis with myelopathy, cervical region: Secondary | ICD-10-CM | POA: Diagnosis not present

## 2019-07-14 DIAGNOSIS — M5412 Radiculopathy, cervical region: Secondary | ICD-10-CM

## 2019-07-14 DIAGNOSIS — M960 Pseudarthrosis after fusion or arthrodesis: Secondary | ICD-10-CM

## 2019-07-14 DIAGNOSIS — M5414 Radiculopathy, thoracic region: Secondary | ICD-10-CM

## 2019-07-14 DIAGNOSIS — G894 Chronic pain syndrome: Secondary | ICD-10-CM

## 2019-07-14 DIAGNOSIS — Z981 Arthrodesis status: Secondary | ICD-10-CM

## 2019-07-14 DIAGNOSIS — M47812 Spondylosis without myelopathy or radiculopathy, cervical region: Secondary | ICD-10-CM

## 2019-07-14 DIAGNOSIS — F4310 Post-traumatic stress disorder, unspecified: Secondary | ICD-10-CM

## 2019-07-14 MED ORDER — HYDROCODONE-ACETAMINOPHEN 10-325 MG PO TABS: 1.0000 | ORAL_TABLET | Freq: Two times a day (BID) | ORAL | 0 refills | Status: DC | PRN

## 2019-07-14 MED ORDER — BUPRENORPHINE 10 MCG/HR TD PTWK: 1.0000 | MEDICATED_PATCH | TRANSDERMAL | 1 refills | Status: DC

## 2019-07-14 NOTE — Progress Notes (Signed)
Patient: Thomas Cole  Service Category: E/M  Provider: Gillis Santa, MD  DOB: [birth year]  DOS: 07/14/2019  Location: Office  MRN: [id number]  Setting: Ambulatory outpatient  Referring Provider: Derinda Late, MD  Type: Established Patient  Specialty: Interventional Pain Management  PCP: Derinda Late, MD  Location: Home  Delivery: TeleHealth     Virtual Encounter - Pain Management PROVIDER NOTE: Information contained herein reflects review and annotations entered in association with encounter. Interpretation of such information and data should be left to medically-trained personnel. Information provided to patient can be located elsewhere in the medical record under "Patient Instructions". Document created using STT-dictation technology, any transcriptional errors that may result from process are unintentional.    Contact & Pharmacy Preferred: [phone] Home: [phone] (home) Mobile: [phone] (mobile) E-mail: [email or web address]  [place], [place] - [place] [address] Phone: [phone] Fax: [phone]   Pre-screening  Thomas Cole offered "in-person" vs "virtual" encounter. He indicated preferring virtual for this encounter.   Reason COVID-19*  Social distancing based on CDC and AMA recommendations.   I contacted Thomas Cole on 07/14/2019 via telephone.      I clearly identified myself as Gillis Santa, MD. I verified that I was speaking with the correct person using two identifiers (Name: Thomas Cole, and date of birth: April 03, 1970).  This visit was completed via telephone due to the restrictions of the COVID-19 pandemic. All issues as above were discussed and addressed but no physical exam was performed. If it was felt that the patient should be evaluated in the office, they were directed there. The patient verbally consented to this visit. Patient was unable to complete an audio/visual visit due to Technical  difficulties and/or Lack of internet. Due to the catastrophic nature of the COVID-19 pandemic, this visit was done through audio contact only.  Location of the patient: home address (see Epic for details)  Location of the provider: office  Consent I sought verbal advanced consent from Thomas Cole for virtual visit interactions. I informed Thomas Cole of possible security and privacy concerns, risks, and limitations associated with providing "not-in-person" medical evaluation and management services. I also informed Thomas Cole of the availability of "in-person" appointments. Finally, I informed him that there would be a charge for the virtual visit and that he could be  personally, fully or partially, financially responsible for it. Thomas Cole expressed understanding and agreed to proceed.   Historic Elements   Thomas Cole is a 49 y.o. year old, male patient evaluated today after his last contact with our practice on 07/03/2019. Thomas Cole  has a past medical history of Anxiety, Asthma, Back problem, Benign essential hypertension (11/30/2016), Cervical spondylosis without myelopathy (4/70/9628), Complication of anesthesia, Depression, GERD (gastroesophageal reflux disease), Hypertension, Labile hypertension (03/19/2013), Major depressive disorder, recurrent episode, severe ([place]) (06/23/2015), Migraine headache, Migraines (11/11/2013), S/P appendectomy (04/09/2011), Seizure ([place]) (11/30/2016), Seizures ([place]), Shortness of breath, and SOB (shortness of breath) (08/06/2014). He also  has a past surgical history that includes Knee surgery; Posterior fusion cervical spine; Cervical disc surgery; Appendectomy (12); Anterior cervical corpectomy (N/A, 09/15/2012); and Cholecystectomy (N/A, 03/29/2017). Thomas Cole has a current medication list which includes the following prescription(s): butalbital-acetaminophen-caffeine, carbidopa-levodopa, vitamin d3, clonazepam, duloxetine, entacapone, hydrochlorothiazide,  hydrocodone-acetaminophen, nortriptyline, omeprazole, omeprazole, ondansetron, ondansetron, phenytoin, phenytoin, polyethylene glycol powder, pregabalin, propranolol, tizanidine, valsartan, vitamin b-12, buprenorphine, and losartan-hydrochlorothiazide. He  reports that he has never smoked. He has never used smokeless tobacco. He  reports that he does not drink alcohol or use drugs. Thomas Cole is allergic to amitriptyline; bee venom; chlorhexidine; carbamazepine; meloxicam; morphine and related; and sulfa antibiotics.   HPI  Today, he is being contacted for medication management.   Overall, the patient is finding benefit with Butrans patch at 7.5 mcg an hour.  It is helping with his chronic pain but not so much with his headaches.  He is utilizing less hydrocodone as a result of his baseline pain being better managed on a long-acting.  He states that by Thursday, day 4 out of 7 the patch starts to lose effectiveness.  We discussed increasing the patch dose to 10 mcg an hour to optimize pain management.  Pharmacotherapy Assessment  Analgesic: 07/08/2019  2   06/22/2019  Hydrocodone-Acetamin 10-325 MG  60.00  30 Bi Lat   [id number]   Gib (4800)   0  20.00 MME  Comm Ins   [place]  07/03/2019  2   05/06/2019  Pregabalin 100 MG Capsule  90.00  30 Bi Lat   [id number]   Gib (4800)   2  2.01 LME  Comm Ins   [place]  07/03/2019  2   07/03/2019  Buprenorphine 7.5 Mcg/Hr Patch  4.00  28 Bi Lat   [id number]   Wal (1123)   0  0.18 mg  Comm Ins        Monitoring: [place] PMP: PDMP reviewed during this encounter.       Pharmacotherapy: No side-effects or adverse reactions reported. Compliance: No problems identified. Effectiveness: Clinically acceptable. Plan: Refer to "POC".  UDS: No results found for: SUMMARY obtain at next visit Laboratory Chemistry Profile   Renal Lab Results  Component Value Date   BUN 13 04/21/2017   CREATININE 0.93 04/21/2017   LABCREA 79.3 03/05/2014   GFR 82.19 08/05/2014   GFRAA >60 04/21/2017    GFRNONAA >60 04/21/2017     Hepatic Lab Results  Component Value Date   AST 30 04/21/2017   ALT 23 04/21/2017   ALBUMIN 4.2 04/21/2017   ALKPHOS 86 04/21/2017   LIPASE 51 04/21/2017     Electrolytes Lab Results  Component Value Date   NA 138 04/21/2017   K 3.7 04/21/2017   CL 101 04/21/2017   CALCIUM 9.4 04/21/2017     Bone Lab Results  Component Value Date   VD25OH 31.95 03/05/2016     Inflammation (CRP: Acute Phase) (ESR: Chronic Phase) Lab Results  Component Value Date   CRP 0.3 (L) 04/29/2017   ESRSEDRATE 2 04/29/2017       Note: Above Lab results reviewed.  Imaging  NM Myocar Multi W/Spect W/Wall Motion / EF  This is a low risk study.  The left ventricular ejection fraction is moderately decreased (30-44%).  The study is normal.  Blood pressure demonstrated a normal response to exercise.  There was no ST segment deviation noted during stress.    Assessment  The primary encounter diagnosis was Chronic pain syndrome. Diagnoses of Cervical facet joint syndrome, Spondylosis, cervical, with myelopathy, H/O cervical spinal arthrodesis (C3-C7 fusion), Myelopathy of cervical spinal cord with cervical radiculopathy, Failed cervical fusion, Generalized anxiety disorder, PTSD (post-traumatic stress disorder), and Thoracic radiculopathy were also pertinent to this visit.  Plan of Care  Thomas Cole has a current medication list which includes the following long-term medication(s): carbidopa-levodopa, clonazepam, entacapone, nortriptyline, omeprazole, omeprazole, phenytoin, phenytoin, pregabalin, propranolol, valsartan, and losartan-hydrochlorothiazide.  Continue Lyrica, Cymbalta as prescribed.  Increase Butrans patch to  10 mcg an hour.  Continue hydrocodone as needed for breakthrough pain.  Follow-up in 2 months.  Pharmacotherapy (Medications Ordered): Meds ordered this encounter  Medications  . HYDROcodone-acetaminophen (NORCO) 10-325 MG tablet    Sig: Take  1 tablet by mouth 2 (two) times daily as needed for severe pain. Must last 30 days.    Dispense:  60 tablet    Refill:  0    Chronic Pain. (STOP Act - Not applicable). Fill one day early if closed on scheduled refill date.  . buprenorphine (BUTRANS) 10 MCG/HR PTWK patch    Sig: Place 1 patch onto the skin every 7 (seven) days.    Dispense:  4 patch    Refill:  1    For chronic pain syndrome   Follow-up plan:   Return in about 8 weeks (around 09/08/2019) for Medication Management, in person, (UDS).     Diagnostic cervical facet medial branch nerve blocks C3-C7 b/l 05/06/19- did not help Diagnostic thoracic facet medial branch nerve blocks Cervical and/or thoracic epidural steroid injection Cervical/trapezius trigger point injections SPG block          Recent Visits Date Type Provider Dept  06/22/19 Office Visit Gillis Santa, MD [facility]  05/06/19 Procedure visit Gillis Santa, MD [facility]  04/27/19 Office Visit Gillis Santa, MD [facility]  Showing recent visits within past 90 days and meeting all other requirements   Today's Visits Date Type Provider Dept  07/14/19 Office Visit Gillis Santa, MD [facility]  Showing today's visits and meeting all other requirements   Future Appointments No visits were found meeting these conditions.  Showing future appointments within next 90 days and meeting all other requirements   I discussed the assessment and treatment plan with the patient. The patient was provided an opportunity to ask questions and all were answered. The patient agreed with the plan and demonstrated an understanding of the instructions.  Patient advised to call back or seek an in-person evaluation if the symptoms or condition worsens.  Duration of encounter: 25 minutes.  Note by: Gillis Santa, MD Date: 07/14/2019; Time: 9:23 AM

## 2019-08-05 ENCOUNTER — Other Ambulatory Visit: Payer: Self-pay | Admitting: Gastroenterology

## 2019-08-05 DIAGNOSIS — R1013 Epigastric pain: Secondary | ICD-10-CM

## 2019-08-05 DIAGNOSIS — K219 Gastro-esophageal reflux disease without esophagitis: Secondary | ICD-10-CM

## 2019-08-05 DIAGNOSIS — R6881 Early satiety: Secondary | ICD-10-CM

## 2019-08-21 ENCOUNTER — Other Ambulatory Visit: Payer: 59

## 2019-08-24 ENCOUNTER — Other Ambulatory Visit: Payer: Self-pay

## 2019-08-24 ENCOUNTER — Ambulatory Visit
Admission: RE | Admit: 2019-08-24 | Discharge: 2019-08-24 | Disposition: A | Discharge status: Home / Self Care | Payer: 59 | Source: Ambulatory Visit | Attending: Gastroenterology | Admitting: Gastroenterology

## 2019-08-24 DIAGNOSIS — R1013 Epigastric pain: Secondary | ICD-10-CM | POA: Diagnosis present

## 2019-08-24 DIAGNOSIS — K219 Gastro-esophageal reflux disease without esophagitis: Secondary | ICD-10-CM | POA: Insufficient documentation

## 2019-08-24 DIAGNOSIS — R6881 Early satiety: Secondary | ICD-10-CM | POA: Insufficient documentation

## 2019-08-24 MED ORDER — TECHNETIUM TC 99M SULFUR COLLOID
2.0000 | Freq: Once | INTRAVENOUS | Status: AC | PRN
  Administered 2019-08-24: 2.57 via INTRAVENOUS

## 2019-09-08 ENCOUNTER — Encounter: Payer: Self-pay | Admitting: Student in an Organized Health Care Education/Training Program

## 2019-09-08 ENCOUNTER — Other Ambulatory Visit: Payer: Self-pay

## 2019-09-08 ENCOUNTER — Ambulatory Visit
Payer: 59 | Attending: Student in an Organized Health Care Education/Training Program | Admitting: Student in an Organized Health Care Education/Training Program

## 2019-09-08 ENCOUNTER — Telehealth: Payer: Self-pay | Admitting: *Deleted

## 2019-09-08 DIAGNOSIS — Z981 Arthrodesis status: Secondary | ICD-10-CM | POA: Diagnosis not present

## 2019-09-08 DIAGNOSIS — M4712 Other spondylosis with myelopathy, cervical region: Secondary | ICD-10-CM | POA: Diagnosis not present

## 2019-09-08 DIAGNOSIS — G959 Disease of spinal cord, unspecified: Secondary | ICD-10-CM

## 2019-09-08 DIAGNOSIS — G894 Chronic pain syndrome: Secondary | ICD-10-CM

## 2019-09-08 DIAGNOSIS — M5414 Radiculopathy, thoracic region: Secondary | ICD-10-CM

## 2019-09-08 DIAGNOSIS — M47812 Spondylosis without myelopathy or radiculopathy, cervical region: Secondary | ICD-10-CM

## 2019-09-08 DIAGNOSIS — M960 Pseudarthrosis after fusion or arthrodesis: Secondary | ICD-10-CM | POA: Diagnosis not present

## 2019-09-08 DIAGNOSIS — F411 Generalized anxiety disorder: Secondary | ICD-10-CM

## 2019-09-08 DIAGNOSIS — F4310 Post-traumatic stress disorder, unspecified: Secondary | ICD-10-CM

## 2019-09-08 MED ORDER — BELBUCA 450 MCG BU FILM: 1.0000 | ORAL_FILM | Freq: Two times a day (BID) | BUCCAL | 1 refills | Status: DC

## 2019-09-08 MED ORDER — HYDROCODONE-ACETAMINOPHEN 10-325 MG PO TABS: 1.0000 | ORAL_TABLET | Freq: Two times a day (BID) | ORAL | 0 refills | Status: DC | PRN

## 2019-09-08 NOTE — Progress Notes (Signed)
Patient: Thomas Cole  Service Category: E/M  Provider: Gillis Santa, MD  DOB: 1970/09/18  DOS: 09/08/2019  Location: Office  MRN: [id number]  Setting: Ambulatory outpatient  Referring Provider: Derinda Late, MD  Type: Established Patient  Specialty: Interventional Pain Management  PCP: Derinda Late, MD  Location: Home  Delivery: TeleHealth     Virtual Encounter - Pain Management PROVIDER NOTE: Information contained herein reflects review and annotations entered in association with encounter. Interpretation of such information and data should be left to medically-trained personnel. Information provided to patient can be located elsewhere in the medical record under "Patient Instructions". Document created using STT-dictation technology, any transcriptional errors that may result from process are unintentional.    Contact & Pharmacy Preferred: [phone] Home: [phone] (home) Mobile: [phone] (mobile) E-mail: [email or web address]  [place], [place] - [address] [place] [address] Phone: [phone] Fax: [phone]   Pre-screening  Thomas Cole offered "in-person" vs "virtual" encounter. He indicated preferring virtual for this encounter.   Reason COVID-19*  Social distancing based on CDC and AMA recommendations.   I contacted Thomas Cole on 09/08/2019 via televisit.      I clearly identified myself as Gillis Santa, MD. I verified that I was speaking with the correct person using two identifiers (Name: Thomas Cole, and date of birth: [birth year]).  Consent I sought verbal advanced consent from Thomas Cole for virtual visit interactions. I informed Thomas Cole of possible security and privacy concerns, risks, and limitations associated with providing "not-in-person" medical evaluation and management services. I also informed Thomas Cole of the availability of "in-person" appointments. Finally, I informed him that there would  be a charge for the virtual visit and that he could be  personally, fully or partially, financially responsible for it. Thomas Cole expressed understanding and agreed to proceed.   Historic Elements   Thomas Cole is a 49 y.o. year old, male patient evaluated today after his last contact with our practice on 09/08/2019. Thomas Cole  has a past medical history of Anxiety, Asthma, Back problem, Benign essential hypertension (11/30/2016), Cervical spondylosis without myelopathy (2/63/3354), Complication of anesthesia, Depression, GERD (gastroesophageal reflux disease), Hypertension, Labile hypertension (03/19/2013), Major depressive disorder, recurrent episode, severe ([place]) (06/23/2015), Migraine headache, Migraines (11/11/2013), S/P appendectomy (04/09/2011), Seizure ([place]) (11/30/2016), Seizures ([place]), Shortness of breath, and SOB (shortness of breath) (08/06/2014). He also  has a past surgical history that includes Knee surgery; Posterior fusion cervical spine; Cervical disc surgery; Appendectomy (12); Anterior cervical corpectomy (N/A, 09/15/2012); and Cholecystectomy (N/A, 03/29/2017). Thomas Cole has a current medication list which includes the following prescription(s): butalbital-acetaminophen-caffeine, carbidopa-levodopa, vitamin d3, clonazepam, duloxetine, entacapone, hydrochlorothiazide, nortriptyline, omeprazole, omeprazole, ondansetron, ondansetron, phenytoin, phenytoin, polyethylene glycol powder, pregabalin, propranolol, tizanidine, valsartan, vitamin b-12, belbuca, hydrocodone-acetaminophen, and losartan-hydrochlorothiazide. He  reports that he has never smoked. He has never used smokeless tobacco. He reports that he does not drink alcohol or use drugs. Thomas Cole is allergic to amitriptyline; bee venom; chlorhexidine; carbamazepine; meloxicam; morphine and related; and sulfa antibiotics.   HPI  Today, he is being contacted for medication management.   Patient endorses persistent headaches and neck pain.   He does have a C3-C7 fusion and a history of cervical myelopathy.  He is endorsing night sweats.  Denies any weight loss.  States that the [facility] patch which was once helpful has become less effective.  He is noticing itching in skin irritation at the site of patch application.  He states  that after the first 3 days of patch application, it becomes less effective.  Discussed transitioning to belbuca which is a buccal film to avoid the skin irritation associated with the patch.  Risks and benefits were reviewed and patient would like to proceed.  We will continue to monitor his night sweats.  Also had extensive discussion with him regarding prognosis and his chronic pain syndrome going forward.  We discussed the importance of minimizing injury and being mindful of positions and movements that may aggravate his pain.  We will also refill his hydrocodone as below.  Pharmacotherapy Assessment  Analgesic: Discontinue Butrans patch given skin irritation and decreased effectiveness after the first 3 days of patch application.  We will transition to [facility] as below.  Refill hydrocodone 10 mg twice daily as needed for breakthrough pain.  Monitoring:  PMP: PDMP reviewed during this encounter.       Pharmacotherapy: No side-effects or adverse reactions reported. Compliance: No problems identified. Effectiveness: Clinically acceptable. Plan: Refer to "POC".  UDS: No results found for: SUMMARY  Laboratory Chemistry Profile   Renal Lab Results  Component Value Date   BUN 13 04/21/2017   CREATININE 0.93 04/21/2017   LABCREA 79.3 03/05/2014   GFR 82.19 08/05/2014   GFRAA >60 04/21/2017   GFRNONAA >60 04/21/2017     Hepatic Lab Results  Component Value Date   AST 30 04/21/2017   ALT 23 04/21/2017   ALBUMIN 4.2 04/21/2017   ALKPHOS 86 04/21/2017   LIPASE 51 04/21/2017     Electrolytes Lab Results  Component Value Date   NA 138 04/21/2017   K 3.7 04/21/2017   CL 101 04/21/2017   CALCIUM  9.4 04/21/2017     Bone Lab Results  Component Value Date   VD25OH 31.95 03/05/2016     Inflammation (CRP: Acute Phase) (ESR: Chronic Phase) Lab Results  Component Value Date   CRP 0.3 (L) 04/29/2017   ESRSEDRATE 2 04/29/2017       Note: Above Lab results reviewed.   Imaging  NM GASTRIC EMPTYING CLINICAL DATA:  Epigastric pain  EXAM: NUCLEAR MEDICINE GASTRIC EMPTYING SCAN  TECHNIQUE: After oral ingestion of radiolabeled meal, sequential abdominal images were obtained for 4 hours. Percentage of activity emptying the stomach was calculated at 1 hour, 2 hour, 3 hour, and 4 hours.  RADIOPHARMACEUTICALS:  2.57 mCi Tc-83msulfur colloid in standardized meal  COMPARISON:  Ultrasound 04/21/2017  FINDINGS: Expected location of the stomach in the left upper quadrant. Ingested meal empties the stomach gradually over the course of the study.  49% emptied at 1 hr ( normal >= 10%)  67% emptied at 2 hr ( normal >= 40%)  81% emptied at 3 hr ( normal >= 70%)  100% emptied at 4 hr ( normal >= 90%)  IMPRESSION: Normal gastric emptying study.  Electronically Signed   By: KDonavan FoilM.D.   On: 08/24/2019 16:55  Assessment  The primary encounter diagnosis was Cervical facet joint syndrome. Diagnoses of Spondylosis, cervical, with myelopathy, H/O cervical spinal arthrodesis (C3-C7 fusion), Myelopathy of cervical spinal cord with cervical radiculopathy, Failed cervical fusion, Generalized anxiety disorder, PTSD (post-traumatic stress disorder), Thoracic radiculopathy, and Chronic pain syndrome were also pertinent to this visit.  Plan of Care  Mr. KSHAUGHN THOMLEYhas a current medication list which includes the following long-term medication(s): carbidopa-levodopa, clonazepam, entacapone, nortriptyline, omeprazole, omeprazole, phenytoin, phenytoin, pregabalin, propranolol, valsartan, and losartan-hydrochlorothiazide.  Discontinue Butrans.  Start Belbuca as below.  Refill  hydrocodone as  below.  Continue Lyrica, Cymbalta and nortriptyline as prescribed.  Consider greater occipital nerve block at follow-up along with cervical TPI.  Pharmacotherapy (Medications Ordered): Meds ordered this encounter  Medications  . Buprenorphine HCl (BELBUCA) 450 MCG FILM    Sig: Place 1 Film (450 mcg total) inside cheek every 12 (twelve) hours.    Dispense:  60 each    Refill:  1  . HYDROcodone-acetaminophen (NORCO) 10-325 MG tablet    Sig: Take 1 tablet by mouth every 12 (twelve) hours as needed for severe pain.    Dispense:  60 tablet    Refill:  0    Chronic Pain. (STOP Act - Not applicable). Fill one day early if closed on scheduled refill date.    Follow-up plan:   Return in about 7 weeks (around 10/27/2019) for Medication Management, in person.     Diagnostic cervical facet medial branch nerve blocks C3-C7 b/l 05/06/19- did not help Diagnostic thoracic facet medial branch nerve blocks Cervical and/or thoracic epidural steroid injection Cervical/trapezius trigger point injections SPG block           Recent Visits Date Type Provider Dept  07/14/19 Office Visit Gillis Santa, MD [facility]  06/22/19 Office Visit Gillis Santa, MD [facility]  Showing recent visits within past 90 days and meeting all other requirements   Today's Visits Date Type Provider Dept  09/08/19 Telemedicine Gillis Santa, MD [facility]  Showing today's visits and meeting all other requirements   Future Appointments No visits were found meeting these conditions.  Showing future appointments within next 90 days and meeting all other requirements   I discussed the assessment and treatment plan with the patient. The patient was provided an opportunity to ask questions and all were answered. The patient agreed with the plan and demonstrated an understanding of the instructions.  Patient advised to call back or seek an in-person evaluation if the symptoms or  condition worsens.  Duration of encounter: 31 minutes.  Note by: Gillis Santa, MD Date: 09/08/2019; Time: 2:39 PM

## 2019-09-17 ENCOUNTER — Encounter: Payer: Self-pay | Admitting: Student in an Organized Health Care Education/Training Program

## 2019-09-23 ENCOUNTER — Encounter: Payer: Self-pay | Admitting: Student in an Organized Health Care Education/Training Program

## 2019-09-23 ENCOUNTER — Telehealth: Payer: Self-pay

## 2019-09-24 ENCOUNTER — Ambulatory Visit
Payer: 59 | Attending: Student in an Organized Health Care Education/Training Program | Admitting: Student in an Organized Health Care Education/Training Program

## 2019-09-24 ENCOUNTER — Encounter: Payer: Self-pay | Admitting: Student in an Organized Health Care Education/Training Program

## 2019-09-24 ENCOUNTER — Other Ambulatory Visit: Payer: Self-pay

## 2019-09-24 DIAGNOSIS — Z981 Arthrodesis status: Secondary | ICD-10-CM

## 2019-09-24 DIAGNOSIS — M960 Pseudarthrosis after fusion or arthrodesis: Secondary | ICD-10-CM | POA: Diagnosis not present

## 2019-09-24 DIAGNOSIS — M47894 Other spondylosis, thoracic region: Secondary | ICD-10-CM | POA: Diagnosis not present

## 2019-09-24 DIAGNOSIS — M5481 Occipital neuralgia: Secondary | ICD-10-CM

## 2019-09-24 DIAGNOSIS — M5412 Radiculopathy, cervical region: Secondary | ICD-10-CM

## 2019-09-24 DIAGNOSIS — M5414 Radiculopathy, thoracic region: Secondary | ICD-10-CM

## 2019-09-24 DIAGNOSIS — M47812 Spondylosis without myelopathy or radiculopathy, cervical region: Secondary | ICD-10-CM

## 2019-09-24 DIAGNOSIS — G959 Disease of spinal cord, unspecified: Secondary | ICD-10-CM

## 2019-09-24 DIAGNOSIS — G894 Chronic pain syndrome: Secondary | ICD-10-CM

## 2019-09-24 DIAGNOSIS — M4712 Other spondylosis with myelopathy, cervical region: Secondary | ICD-10-CM

## 2019-09-24 MED ORDER — HYDROCODONE-ACETAMINOPHEN 10-325 MG PO TABS: 1.0000 | ORAL_TABLET | Freq: Three times a day (TID) | ORAL | 0 refills | Status: DC | PRN

## 2019-09-24 NOTE — Progress Notes (Signed)
Patient: Thomas Cole  Service Category: E/M  Provider: Gillis Santa, MD  DOB: [birth year]  DOS: 09/24/2019  Location: Office  MRN: [id number]  Setting: Ambulatory outpatient  Referring Provider: Derinda Late, MD  Type: Established Patient  Specialty: Interventional Pain Management  PCP: Derinda Late, MD  Location: Home  Delivery: TeleHealth     Virtual Encounter - Pain Management PROVIDER NOTE: Information contained herein reflects review and annotations entered in association with encounter. Interpretation of such information and data should be left to medically-trained personnel. Information provided to patient can be located elsewhere in the medical record under "Patient Instructions". Document created using STT-dictation technology, any transcriptional errors that may result from process are unintentional.    Contact & Pharmacy Preferred: [phone] Home: [phone] (home) Mobile: [phone] (mobile) E-mail: [email or web address]  [place], [place] - [place] [address] [address] Phone: [phone] Fax: [phone]   Pre-screening  Thomas Cole offered "in-person" vs "virtual" encounter. He indicated preferring virtual for this encounter.   Reason COVID-19*  Social distancing based on CDC and AMA recommendations.   I contacted Thomas Cole on 09/24/2019 via televisit.      I clearly identified myself as Gillis Santa, MD. I verified that I was speaking with the correct person using two identifiers (Name: Thomas Cole, and date of birth: 50-Mar-1972).  Consent I sought verbal advanced consent from Thomas Cole for virtual visit interactions. I informed Thomas Cole of possible security and privacy concerns, risks, and limitations associated with providing "not-in-person" medical evaluation and management services. I also informed Thomas Cole of the availability of "in-person" appointments. Finally, I informed him that there would  be a charge for the virtual visit and that he could be  personally, fully or partially, financially responsible for it. Thomas Cole expressed understanding and agreed to proceed.   Historic Elements   Thomas Cole is a 49 y.o. year old, male patient evaluated today after his last contact with our practice on 09/23/2019. Thomas Cole  has a past medical history of Anxiety, Asthma, Back problem, Benign essential hypertension (11/30/2016), Cervical spondylosis without myelopathy (2/37/6283), Complication of anesthesia, Depression, GERD (gastroesophageal reflux disease), Hypertension, Labile hypertension (03/19/2013), Major depressive disorder, recurrent episode, severe ([place]) (06/23/2015), Migraine headache, Migraines (11/11/2013), S/P appendectomy (04/09/2011), Seizure ([place]) (11/30/2016), Seizures ([place]), Shortness of breath, and SOB (shortness of breath) (08/06/2014). He also  has a past surgical history that includes Knee surgery; Posterior fusion cervical spine; Cervical disc surgery; Appendectomy (12); Anterior cervical corpectomy (N/A, 09/15/2012); and Cholecystectomy (N/A, 03/29/2017). Thomas Cole has a current medication list which includes the following prescription(s): butalbital-acetaminophen-caffeine, carbidopa-levodopa, vitamin d3, clonazepam, duloxetine, entacapone, hydrochlorothiazide, [START ON 10/01/2019] hydrocodone-acetaminophen, [START ON 10/31/2019] hydrocodone-acetaminophen, nortriptyline, omeprazole, omeprazole, ondansetron, ondansetron, phenytoin, polyethylene glycol powder, pregabalin, propranolol, tizanidine, valsartan, vitamin b-12, and losartan-hydrochlorothiazide. He  reports that he has never smoked. He has never used smokeless tobacco. He reports that he does not drink alcohol and does not use drugs. Thomas Cole is allergic to amitriptyline, bee venom, chlorhexidine, carbamazepine, meloxicam, morphine and related, and sulfa antibiotics.   HPI  Today, he is being contacted for worsening of  previously known (established) problem   Patient endorses severe neck pain with associated posterior dominant cervical headaches along with pain between his medial scapular borders and trapezius region.  Patient states that he is having a hard time managing his pain.  He does not find buprenorphine at 450 mcg twice a day helpful.  He also  utilizes his multimodal regimen as below which includes Cymbalta, nortriptyline, Lyrica, tizanidine as needed.  Patient has a C3-C7 fusion with adjacent segment disease and its associated cervical myelomalacia.  He has failed injection therapies with me.  He states that his quality of life is decreasing.  He is having trouble performing ADLs.  Pharmacotherapy Assessment  Analgesic: 09/08/2019  2   09/08/2019  Hydrocodone-Acetamin 10-325 MG  60.00  30 Bi Lat   [id number]   Wal (1123)   0  20.00 MME  Comm Ins   [place]     Monitoring: [place] PMP: PDMP reviewed during this encounter.       Pharmacotherapy: No side-effects or adverse reactions reported. Compliance: No problems identified. Effectiveness: Clinically acceptable. Plan: Refer to "POC".  UDS: No results found for: SUMMARY  Laboratory Chemistry Profile   Renal Lab Results  Component Value Date   BUN 13 04/21/2017   CREATININE 0.93 04/21/2017   LABCREA 79.3 03/05/2014   GFR 82.19 08/05/2014   GFRAA >60 04/21/2017   GFRNONAA >60 04/21/2017     Hepatic Lab Results  Component Value Date   AST 30 04/21/2017   ALT 23 04/21/2017   ALBUMIN 4.2 04/21/2017   ALKPHOS 86 04/21/2017   LIPASE 51 04/21/2017     Electrolytes Lab Results  Component Value Date   NA 138 04/21/2017   K 3.7 04/21/2017   CL 101 04/21/2017   CALCIUM 9.4 04/21/2017     Bone Lab Results  Component Value Date   VD25OH 31.95 03/05/2016     Inflammation (CRP: Acute Phase) (ESR: Chronic Phase) Lab Results  Component Value Date   CRP 0.3 (L) 04/29/2017   ESRSEDRATE 2 04/29/2017       Note: Above Lab results  reviewed.   Imaging  NM GASTRIC EMPTYING CLINICAL DATA:  Epigastric pain  EXAM: NUCLEAR MEDICINE GASTRIC EMPTYING SCAN  TECHNIQUE: After oral ingestion of radiolabeled meal, sequential abdominal images were obtained for 4 hours. Percentage of activity emptying the stomach was calculated at 1 hour, 2 hour, 3 hour, and 4 hours.  RADIOPHARMACEUTICALS:  2.57 mCi Tc-41msulfur colloid in standardized meal  COMPARISON:  Ultrasound 04/21/2017  FINDINGS: Expected location of the stomach in the left upper quadrant. Ingested meal empties the stomach gradually over the course of the study.  49% emptied at 1 hr ( normal >= 10%)  67% emptied at 2 hr ( normal >= 40%)  81% emptied at 3 hr ( normal >= 70%)  100% emptied at 4 hr ( normal >= 90%)  IMPRESSION: Normal gastric emptying study.  Electronically Signed   By: KDonavan FoilM.D.   On: 08/24/2019 16:55  CLINICAL DATA:  49year old male with prior cervical spine fusion. Myelopathy. Headaches and chronic neck pain.  EXAM: MRI CERVICAL SPINE WITHOUT CONTRAST  TECHNIQUE: Multiplanar, multisequence MR imaging of the cervical spine was performed. No intravenous contrast was administered.  COMPARISON:  [facility] 07/09[facility]2013. Cervical spine MRI 05/29/2011.  FINDINGS: Alignment: Stable since 2015 with straightening of cervical lordosis.  Vertebrae: Previous C3 through C7 ACDF, with fusion at both C3-C4 and C6-C7 since the 2013 MRI. Mild associated hardware susceptibility artifact. No marrow edema or evidence of acute osseous abnormality. Incidental benign vertebral body hemangioma at T2.  Cord: Chronic cervical spinal cord myelomalacia with severe cord atrophy at the C5 level (series 7, image 16), not significantly changed since 2013. Overall cord signal and volume appears stable since that time.  Posterior Fossa, vertebral arteries,  paraspinal tissues: Cervicomedullary  junction is within normal limits. Negative visible posterior fossa. Preserved major vascular flow voids in the neck, the left vertebral artery appears mildly dominant. Negative neck soft tissues.  Disc levels:  C2-C3: Bulky hypertrophy of the posterior longitudinal ligament superimposed on increased disc bulging at this level since 2013. Mild posterior element hypertrophy. Subsequent new mild spinal stenosis and mild spinal cord mass effect (series 7, image 5). Stable borderline to mild left C3 foraminal stenosis.  C3-C4:  Prior ACDF with solid arthrodesis.  C4-C5:  Chronic fusion with solid arthrodesis.  C5-C6:  Chronic fusion with solid arthrodesis.  C6-C7:  Prior ACDF with solid arthrodesis.  C7-T1: Mild to moderate facet hypertrophy greater on the right. Mild endplate spurring. No spinal stenosis. Mild left and mild to moderate right C8 foraminal stenosis appears increased since 2013.  Partially visible upper thoracic facet hypertrophy greater on the right. There is associated moderate to severe bilateral T1 foraminal stenosis which has increased since 2013.  IMPRESSION: 1. Chronic cervical spinal cord myelomalacia, severe at the C5 level, not significantly changed since 2013. 2. Solid-appearing fusion from the C3 to the C7 level. Adjacent segment disease at C2-C3 with bulky ligamentous hypertrophy and new mild spinal stenosis and mild spinal cord mass effect. Lesser adjacent segment disease at C7-T1 but also progressed since 2013 with mild-to-moderate C8 foraminal stenosis. 3. Upper thoracic facet hypertrophy, moderate to severe bilateral T1 foraminal stenosis.   Electronically Signed   By: Genevie Ann M.D.   On: 03/04/2019 01:24   Assessment  The primary encounter diagnosis was Bilateral occipital neuralgia. Diagnoses of Thoracic facet joint syndrome, Myelopathy of cervical spinal cord with cervical radiculopathy, Failed cervical fusion, Spondylosis,  cervical, with myelopathy, H/O cervical spinal arthrodesis (C3-C7 fusion), Thoracic radiculopathy, Chronic pain syndrome, and Cervical facet joint syndrome were also pertinent to this visit.  Plan of Care   Thomas Cole has a current medication list which includes the following long-term medication(s): carbidopa-levodopa, clonazepam, entacapone, nortriptyline, omeprazole, omeprazole, phenytoin, pregabalin, propranolol, valsartan, and losartan-hydrochlorothiazide.   1. Bilateral occipital neuralgia -Occipital headaches with pathology seen at C2-C3 on MRI.  Recommend diagnostic right occipital nerve block. - GREATER OCCIPITAL NERVE BLOCK; Future  2. Thoracic facet joint syndrome -MRI as above shows severe upper thoracic facet hypertrophy, discussed diagnostic thoracic facet blocks at T1/T2 - THORACIC FACET BLOCK; Future  3. Myelopathy of cervical spinal cord with cervical radiculopathy -Worsening symptoms, is not responding to conservative therapy.  Cervical MRI shows adjacent segment disease and patient is demonstrating clinical deterioration.  Referral to neurosurgery. - Ambulatory referral to Neurosurgery - HYDROcodone-acetaminophen (NORCO) 10-325 MG tablet; Take 1 tablet by mouth every 8 (eight) hours as needed for severe pain.  Dispense: 90 tablet; Refill: 0 - HYDROcodone-acetaminophen (NORCO) 10-325 MG tablet; Take 1 tablet by mouth every 8 (eight) hours as needed for severe pain.  Dispense: 90 tablet; Refill: 0  4. Failed cervical fusion -C3-C7 ACDF with adjacent segment disease - Ambulatory referral to Neurosurgery  5. Spondylosis, cervical, with myelopathy - HYDROcodone-acetaminophen (NORCO) 10-325 MG tablet; Take 1 tablet by mouth every 8 (eight) hours as needed for severe pain.  Dispense: 90 tablet; Refill: 0 - HYDROcodone-acetaminophen (NORCO) 10-325 MG tablet; Take 1 tablet by mouth every 8 (eight) hours as needed for severe pain.  Dispense: 90 tablet; Refill: 0  6. H/O  cervical spinal arthrodesis (C3-C7 fusion) - Ambulatory referral to Neurosurgery  7. Chronic pain syndrome -Discontinue buprenorphine.  Increase hydrocodone to 10 mg 3  times daily as needed as below.  Continue Lyrica, Cymbalta, nortriptyline, tizanidine as prescribed. - GREATER OCCIPITAL NERVE BLOCK; Future - THORACIC FACET BLOCK; Future - HYDROcodone-acetaminophen (NORCO) 10-325 MG tablet; Take 1 tablet by mouth every 8 (eight) hours as needed for severe pain.  Dispense: 90 tablet; Refill: 0 - HYDROcodone-acetaminophen (NORCO) 10-325 MG tablet; Take 1 tablet by mouth every 8 (eight) hours as needed for severe pain.  Dispense: 90 tablet; Refill: 0    Pharmacotherapy (Medications Ordered): Meds ordered this encounter  Medications  . HYDROcodone-acetaminophen (NORCO) 10-325 MG tablet    Sig: Take 1 tablet by mouth every 8 (eight) hours as needed for severe pain.    Dispense:  90 tablet    Refill:  0    Chronic Pain. (STOP Act - Not applicable). Fill one day early if closed on scheduled refill date.  Marland Kitchen HYDROcodone-acetaminophen (NORCO) 10-325 MG tablet    Sig: Take 1 tablet by mouth every 8 (eight) hours as needed for severe pain.    Dispense:  90 tablet    Refill:  0    Chronic Pain. (STOP Act - Not applicable). Fill one day early if closed on scheduled refill date.   Orders:  Orders Placed This Encounter  Procedures  . GREATER OCCIPITAL NERVE BLOCK    Standing Status:   Future    Standing Expiration Date:   12/25/2019    Scheduling Instructions:     Procedure: Occipital nerve block     Laterality: Bilateral     Sedation: with     Timeframe: ASAA    Order Specific Question:   Where will this procedure be performed?    Answer:   [facility]  . THORACIC FACET BLOCK    Standing Status:   Future    Standing Expiration Date:   10/24/2019    Scheduling Instructions:     Thoracic Medial Branch Block T1/T2     Side: Bilateral     Sedation: with     Timeframe: ASAA    Order  Specific Question:   Where will this procedure be performed?    Answer:   [facility]  . Ambulatory referral to Neurosurgery    Referral Priority:   Routine    Referral Type:   Surgical    Referral Reason:   Specialty Services Required    Referred to Provider:   Meade Maw, MD    Requested Specialty:   Neurosurgery    Number of Visits Requested:   1   Follow-up plan:   Return in about 2 weeks (around 10/08/2019) for B/L GONB and T1/T2 Fcts , with sedation 30 mins.     Diagnostic cervical facet medial branch nerve blocks C3-C7 b/l 05/06/19- did not help Diagnostic thoracic facet medial branch nerve blocks  Cervical/trapezius trigger point injections SPG block            Recent Visits Date Type Provider Dept  09/08/19 Telemedicine Gillis Santa, MD [facility]  07/14/19 Office Visit Gillis Santa, MD [facility]  Showing recent visits within past 90 days and meeting all other requirements Today's Visits Date Type Provider Dept  09/24/19 Telemedicine Gillis Santa, MD [facility]  Showing today's visits and meeting all other requirements Future Appointments Date Type Provider Dept  10/27/19 Appointment Gillis Santa, MD [facility]  Showing future appointments within next 90 days and meeting all other requirements  I discussed the assessment and treatment plan with the patient. The patient  was provided an opportunity to ask questions and all were answered. The patient agreed with the plan and demonstrated an understanding of the instructions.  Patient advised to call back or seek an in-person evaluation if the symptoms or condition worsens.  Duration of encounter: 39mnutes.  Note by: BGillis Santa MD Date: 09/24/2019; Time: 9:37 AM

## 2019-10-21 ENCOUNTER — Other Ambulatory Visit: Payer: Self-pay

## 2019-10-21 ENCOUNTER — Other Ambulatory Visit
Admission: RE | Admit: 2019-10-21 | Discharge: 2019-10-21 | Disposition: A | Discharge status: Home / Self Care | Payer: 59 | Source: Ambulatory Visit | Attending: General Surgery | Admitting: General Surgery

## 2019-10-21 DIAGNOSIS — Z20822 Contact with and (suspected) exposure to covid-19: Secondary | ICD-10-CM | POA: Insufficient documentation

## 2019-10-21 DIAGNOSIS — Z01812 Encounter for preprocedural laboratory examination: Secondary | ICD-10-CM | POA: Diagnosis present

## 2019-10-21 LAB — SARS CORONAVIRUS 2 (TAT 6-24 HRS): SARS Coronavirus 2: NEGATIVE

## 2019-10-23 ENCOUNTER — Ambulatory Visit: Payer: 59 | Admitting: Certified Registered"

## 2019-10-23 ENCOUNTER — Encounter: Payer: Self-pay | Admitting: General Surgery

## 2019-10-23 ENCOUNTER — Encounter
Admission: RE | Disposition: A | Discharge status: Home / Self Care | Payer: Self-pay | Source: Home / Self Care | Attending: General Surgery

## 2019-10-23 ENCOUNTER — Ambulatory Visit
Admission: RE | Admit: 2019-10-23 | Discharge: 2019-10-23 | Disposition: A | Discharge status: Home / Self Care | Payer: 59 | Attending: General Surgery | Admitting: General Surgery

## 2019-10-23 ENCOUNTER — Other Ambulatory Visit: Payer: Self-pay

## 2019-10-23 DIAGNOSIS — R1310 Dysphagia, unspecified: Secondary | ICD-10-CM | POA: Insufficient documentation

## 2019-10-23 DIAGNOSIS — F419 Anxiety disorder, unspecified: Secondary | ICD-10-CM | POA: Insufficient documentation

## 2019-10-23 DIAGNOSIS — I10 Essential (primary) hypertension: Secondary | ICD-10-CM | POA: Diagnosis not present

## 2019-10-23 DIAGNOSIS — K259 Gastric ulcer, unspecified as acute or chronic, without hemorrhage or perforation: Secondary | ICD-10-CM | POA: Insufficient documentation

## 2019-10-23 DIAGNOSIS — G709 Myoneural disorder, unspecified: Secondary | ICD-10-CM | POA: Diagnosis not present

## 2019-10-23 DIAGNOSIS — K219 Gastro-esophageal reflux disease without esophagitis: Secondary | ICD-10-CM | POA: Diagnosis not present

## 2019-10-23 DIAGNOSIS — F329 Major depressive disorder, single episode, unspecified: Secondary | ICD-10-CM | POA: Insufficient documentation

## 2019-10-23 DIAGNOSIS — G4730 Sleep apnea, unspecified: Secondary | ICD-10-CM | POA: Diagnosis not present

## 2019-10-23 DIAGNOSIS — G43909 Migraine, unspecified, not intractable, without status migrainosus: Secondary | ICD-10-CM | POA: Insufficient documentation

## 2019-10-23 DIAGNOSIS — Z981 Arthrodesis status: Secondary | ICD-10-CM | POA: Insufficient documentation

## 2019-10-23 DIAGNOSIS — Z79899 Other long term (current) drug therapy: Secondary | ICD-10-CM | POA: Diagnosis not present

## 2019-10-23 DIAGNOSIS — K317 Polyp of stomach and duodenum: Secondary | ICD-10-CM | POA: Diagnosis not present

## 2019-10-23 DIAGNOSIS — Z1211 Encounter for screening for malignant neoplasm of colon: Secondary | ICD-10-CM | POA: Diagnosis not present

## 2019-10-23 DIAGNOSIS — D123 Benign neoplasm of transverse colon: Secondary | ICD-10-CM | POA: Insufficient documentation

## 2019-10-23 DIAGNOSIS — R1013 Epigastric pain: Secondary | ICD-10-CM | POA: Insufficient documentation

## 2019-10-23 DIAGNOSIS — J45909 Unspecified asthma, uncomplicated: Secondary | ICD-10-CM | POA: Insufficient documentation

## 2019-10-23 HISTORY — PX: ESOPHAGOGASTRODUODENOSCOPY (EGD) WITH PROPOFOL: SHX5813

## 2019-10-23 HISTORY — PX: COLONOSCOPY WITH PROPOFOL: SHX5780

## 2019-10-23 SURGERY — COLONOSCOPY WITH PROPOFOL
Anesthesia: General

## 2019-10-23 MED ORDER — PROPOFOL 10 MG/ML IV BOLUS
INTRAVENOUS | Status: DC | PRN
  Administered 2019-10-23: 70 mg via INTRAVENOUS

## 2019-10-23 MED ORDER — SODIUM CHLORIDE 0.9 % IV SOLN: INTRAVENOUS | Status: DC

## 2019-10-23 MED ORDER — PROPOFOL 500 MG/50ML IV EMUL
INTRAVENOUS | Status: AC
  Filled 2019-10-23: qty 50

## 2019-10-23 MED ORDER — LIDOCAINE HCL (CARDIAC) PF 100 MG/5ML IV SOSY
PREFILLED_SYRINGE | INTRAVENOUS | Status: DC | PRN
  Administered 2019-10-23: 50 mg via INTRAVENOUS

## 2019-10-23 MED ORDER — PROPOFOL 500 MG/50ML IV EMUL
INTRAVENOUS | Status: DC | PRN
  Administered 2019-10-23: 125 ug/kg/min via INTRAVENOUS

## 2019-10-23 NOTE — Op Note (Signed)
[facility] Gastroenterology Patient Name: Thomas Cole Procedure Date: 10/23/2019 10:14 AM MRN: [id number] Account #: [id number] Date of Birth: [birth year] Admit Type: Outpatient Age: 49 Room: [facility] ENDO ROOM 1 Gender: Male Note Status: Finalized Procedure:             Upper GI endoscopy Indications:           Epigastric abdominal pain, Dysphagia Providers:             Robert Bellow, MD Referring MD:          Caprice Renshaw MD (Referring MD) Medicines:             Monitored Anesthesia Care Complications:         No immediate complications. Procedure:             Pre-Anesthesia Assessment:                        - Prior to the procedure, a History and Physical was                         performed, and patient medications, allergies and                         sensitivities were reviewed. The patient's tolerance                         of previous anesthesia was reviewed.                        - The risks and benefits of the procedure and the                         sedation options and risks were discussed with the                         patient. All questions were answered and informed                         consent was obtained.                        After obtaining informed consent, the endoscope was                         passed under direct vision. Throughout the procedure,                         the patient's blood pressure, pulse, and oxygen                         saturations were monitored continuously. The Endoscope                         was introduced through the mouth, and advanced to the                         second part of duodenum. The upper GI endoscopy was  accomplished without difficulty. The patient tolerated                         the procedure well. Findings:      Multiple 5 to 10 mm pedunculated and sessile polyps with no bleeding and       no stigmata of recent bleeding were found in the gastric body.  Biopsies       were taken with a cold forceps for histology.      One non-bleeding linear gastric ulcer with no stigmata of bleeding was       found in the prepyloric region of the stomach. The lesion was 2 mm in       largest dimension. Biopsies were taken with a cold forceps for histology.      Localized nodular mucosa was found in the duodenal bulb. Biopsies were       taken with a cold forceps for histology. Impression:            - Multiple gastric polyps. Biopsied.                        - Non-bleeding gastric ulcer with no stigmata of                         bleeding. Biopsied.                        - Nodular mucosa in the duodenal bulb. Biopsied. Recommendation:        - Telephone endoscopist for pathology results in 1                         week.                        - Perform a colonoscopy today. Procedure Code(s):     --- Professional ---                        [phone], Esophagogastroduodenoscopy, flexible,                         transoral; with biopsy, single or multiple Diagnosis Code(s):     --- Professional ---                        K31.7, Polyp of stomach and duodenum                        K25.9, Gastric ulcer, unspecified as acute or chronic,                         without hemorrhage or perforation                        K31.89, Other diseases of stomach and duodenum                        R10.13, Epigastric pain                        R13.10, Dysphagia, unspecified CPT copyright 2019 American Medical Association. All rights reserved. The codes documented in this report  are preliminary and upon coder review may  be revised to meet current compliance requirements. Robert Bellow, MD 10/23/2019 10:56:34 AM This report has been signed electronically. Number of Addenda: 0 Note Initiated On: 10/23/2019 10:14 AM Estimated Blood Loss:  Estimated blood loss was minimal.      [facility]

## 2019-10-23 NOTE — Transfer of Care (Signed)
Immediate Anesthesia Transfer of Care Note  Patient: Thomas Cole  Procedure(s) Performed: COLONOSCOPY WITH PROPOFOL (N/A ) ESOPHAGOGASTRODUODENOSCOPY (EGD) WITH PROPOFOL (N/A )  Patient Location: PACU and Endoscopy Unit  Anesthesia Type:General  Level of Consciousness: awake, drowsy and patient cooperative  Airway & Oxygen Therapy: Patient Spontanous Breathing  Post-op Assessment: Report given to RN and Post -op Vital signs reviewed and stable  Post vital signs: Reviewed and stable  Last Vitals:  Vitals Value Taken Time  BP    Temp    Pulse    Resp    SpO2      Last Pain:  Vitals:   10/23/19 0945  TempSrc: Temporal  PainSc: 0-No pain         Complications: No complications documented.

## 2019-10-23 NOTE — Anesthesia Postprocedure Evaluation (Signed)
Anesthesia Post Note  Patient: Lelon Mast  Procedure(s) Performed: COLONOSCOPY WITH PROPOFOL (N/A ) ESOPHAGOGASTRODUODENOSCOPY (EGD) WITH PROPOFOL (N/A )  Patient location during evaluation: Endoscopy Anesthesia Type: General Level of consciousness: awake and alert Pain management: pain level controlled Vital Signs Assessment: post-procedure vital signs reviewed and stable Respiratory status: spontaneous breathing, nonlabored ventilation, respiratory function stable and patient connected to nasal cannula oxygen Cardiovascular status: blood pressure returned to baseline and stable Postop Assessment: no apparent nausea or vomiting Anesthetic complications: no   No complications documented.   Last Vitals:  Vitals:   10/23/19 1140 10/23/19 1150  BP: 128/79 (!) 132/90  Pulse: 101 88  Resp: 20 15  Temp:    SpO2: 97% 99%    Last Pain:  Vitals:   10/23/19 1150  TempSrc:   PainSc: 0-No pain                 Martha Clan

## 2019-10-23 NOTE — Anesthesia Procedure Notes (Signed)
Date/Time: 10/23/2019 10:40 AM Performed by: Jerrye Noble, CRNA Pre-anesthesia Checklist: Patient identified, Emergency Drugs available, Suction available and Patient being monitored Oxygen Delivery Method: Nasal cannula

## 2019-10-23 NOTE — Op Note (Signed)
[facility] Patient Name: Thomas Cole Procedure Date: 10/23/2019 10:13 AM MRN: [id number] Account #: [id number] Date of Birth: September 27, 1970 Admit Type: Outpatient Age: 49 Room: [facility] ENDO ROOM 1 Gender: Male Note Status: Finalized Procedure:             Colonoscopy Indications:           Screening for colorectal malignant neoplasm Providers:             Robert Bellow, MD Referring MD:          Caprice Renshaw MD (Referring MD) Medicines:             Monitored Anesthesia Care Complications:         No immediate complications. Procedure:             Pre-Anesthesia Assessment:                        - Prior to the procedure, a History and Physical was                         performed, and patient medications, allergies and                         sensitivities were reviewed. The patient's tolerance                         of previous anesthesia was reviewed.                        - The risks and benefits of the procedure and the                         sedation options and risks were discussed with the                         patient. All questions were answered and informed                         consent was obtained.                        After obtaining informed consent, the colonoscope was                         passed under direct vision. Throughout the procedure,                         the patient's blood pressure, pulse, and oxygen                         saturations were monitored continuously. The                         Colonoscope was introduced through the anus and                         advanced to the the cecum, identified by appendiceal                         orifice and  ileocecal valve. The colonoscopy was                         somewhat difficult due to a tortuous colon. Successful                         completion of the procedure was aided by using manual                         pressure. The patient tolerated the  procedure well.                         The quality of the bowel preparation was adequate to                         identify polyps. Findings:      A 7 mm polyp was found in the distal transverse colon. The polyp was       sessile. Biopsies were taken with a cold forceps for histology.      A 5 mm polyp was found in the rectum. The polyp was sessile. Biopsies       were taken with a cold forceps for histology.      The retroflexed view of the distal rectum and anal verge was normal and       showed no anal or rectal abnormalities. Impression:            - One 7 mm polyp in the distal transverse colon.                         Biopsied.                        - One 5 mm polyp in the rectum. Biopsied.                        - The distal rectum and anal verge are normal on                         retroflexion view. Recommendation:        - Telephone endoscopist for pathology results in 1                         week. Procedure Code(s):     --- Professional ---                        [phone], Colonoscopy, flexible; with biopsy, single or                         multiple Diagnosis Code(s):     --- Professional ---                        Z12.11, Encounter for screening for malignant neoplasm                         of colon                        K63.5, Polyp of colon  K62.1, Rectal polyp CPT copyright 2019 American Medical Association. All rights reserved. The codes documented in this report are preliminary and upon coder review may  be revised to meet current compliance requirements. Robert Bellow, MD 10/23/2019 11:27:45 AM This report has been signed electronically. Number of Addenda: 0 Note Initiated On: 10/23/2019 10:13 AM Scope Withdrawal Time: 0 hours 12 minutes 45 seconds  Total Procedure Duration: 0 hours 27 minutes 32 seconds  Estimated Blood Loss:  Estimated blood loss was minimal.      [facility]

## 2019-10-23 NOTE — H&P (Signed)
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 Brigitte Pulse in neurology for a progressive muscle weakness.    Prior cervical fusion.    Medications Prior to Admission  Medication Sig Dispense Refill Last Dose  . buprenorphine (BUTRANS) 10 MCG/HR PTWK Place onto the skin once a week.   Past Week at Unknown time  . butalbital-acetaminophen-caffeine (FIORICET) 50-325-40 MG tablet Take 1 tablet by mouth 5 (five) times daily as needed.   10/22/2019 at Unknown time  . carbidopa-levodopa (SINEMET CR) 50-200 MG tablet Take 1 tablet by mouth at bedtime.   10/22/2019 at Unknown time  . carbidopa-levodopa (SINEMET IR) 25-100 MG tablet TAKE 2 TABLETS BY MOUTH 3 (THREE) TIMES DAILY   10/23/2019 at Unknown time  . Cholecalciferol (VITAMIN D3) 25 MCG (1000 UT) CAPS Take by mouth.   Past Week at Unknown time  . clonazePAM (KLONOPIN) 1 MG tablet Take 1 mg by mouth at bedtime as needed.   10/22/2019 at Unknown time  . DULoxetine (CYMBALTA) 60 MG capsule TAKE 1 CAPSULE (60 MG TOTAL) BY MOUTH ONCE DAILY   10/22/2019 at Unknown time  . entacapone (COMTAN) 200 MG tablet Take 200 mg by mouth at bedtime.   10/22/2019 at Unknown time  . hydrochlorothiazide (HYDRODIURIL) 25 MG tablet Take 25 mg by mouth daily.   10/23/2019 at Unknown time  . losartan-hydrochlorothiazide (HYZAAR) 50-12.5 MG tablet Take 1 tablet by mouth daily.    10/23/2019 at Unknown time  . nortriptyline (PAMELOR) 25 MG capsule Take 25 mg by mouth at bedtime.     . propranolol (INDERAL) 20 MG tablet Take 20 mg by mouth 2 (two) times daily.   10/23/2019 at Unknown time  . valsartan (DIOVAN) 80 MG tablet Take 80 mg by mouth daily.   10/23/2019 at Unknown time  . HYDROcodone-acetaminophen (NORCO) 10-325 MG tablet Take 1  tablet by mouth every 8 (eight) hours as needed for severe pain. 90 tablet 0   . [START ON 10/31/2019] HYDROcodone-acetaminophen (NORCO) 10-325 MG tablet Take 1 tablet by mouth every 8 (eight) hours as needed for severe pain. 90 tablet 0   . nortriptyline (PAMELOR) 10 MG capsule Take 2 capsules (20 mg total) by mouth at bedtime. 60 capsule 3   . omeprazole (PRILOSEC) 20 MG capsule Take x 2 x 30 m before bfast (Patient taking differently: Take 20 mg by mouth 2 (two) times daily before a meal. Take x 2 x 30 m before bfast)     . omeprazole (PRILOSEC) 40 MG capsule      . ondansetron (ZOFRAN) 4 MG tablet Take 1 tablet (4 mg total) by mouth daily as needed for nausea or vomiting. 30 tablet 1   . ondansetron (ZOFRAN) 8 MG tablet      . phenytoin (DILANTIN) 100 MG ER capsule TAKE 2 CAPSULES BY MOUTH EVERY MORNING AND TAKE 3 CAPSULES BY MOUTH EVERY EVENING (Patient taking differently: Take 500 mg by mouth daily. TAKE 2 CAPSULES BY MOUTH EVERY MORNING AND TAKE 3 CAPSULES BY MOUTH EVERY EVENING) 450 capsule 3   . polyethylene glycol powder (GLYCOLAX/MIRALAX) powder Take 17 g by mouth 2 (two) times daily. Please take 17 g twice daily until you begin having multiple bowel movements then you may discontinue  use of MiraLAX. 255 g 0   . pregabalin (LYRICA) 100 MG capsule Take 1 capsule (100 mg total) by mouth 3 (three) times daily. 90 capsule 5   . tiZANidine (ZANAFLEX) 4 MG tablet Take 1 tablet (4 mg total) by mouth every 8 (eight) hours as needed for muscle spasms. 90 tablet 2   . vitamin B-12 (CYANOCOBALAMIN) 1000 MCG tablet Take by mouth.      Allergies  Allergen Reactions  . Amitriptyline Other (See Comments)    Felt really bad- psychologically  . Bee Venom Itching and Swelling  . Chlorhexidine Itching    REACTION TO WIPES/ CLOTHES ONLY==he can tolerate the SCRUB LIQUID  . Carbamazepine Itching and Rash  . Meloxicam Itching and Rash  . Morphine And Related Anxiety    Makes him restless  Makes him  restless   . Sulfa Antibiotics Itching, Rash and Other (See Comments)   Past Medical History:  Diagnosis Date  . Anxiety   . Asthma   . Back problem   . Benign essential hypertension 11/30/2016  . Cervical spondylosis without myelopathy 09/17/2012  . Complication of anesthesia    occ takes longer to wake  . Depression   . GERD (gastroesophageal reflux disease)   . Hypertension   . Labile hypertension 03/19/2013   No current neuro changes, headache much improved. No clear sign of intracranial bleed. NO indication for head CT>  BP much better now... Fluctuates a lot.  Will eval for pheochromocytoma. As recommended at last cardiology OV.. Will prescribe hydralazine to use as needed for BP fluctuations.  Close follow up with PCP.   . Major depressive disorder, recurrent episode, severe ([place]) 06/23/2015  . Migraine headache   . Migraines 11/11/2013  . S/P appendectomy 04/09/2011  . Seizure ([place]) 11/30/2016  . Seizures ([place])    last 87  . Shortness of breath   . SOB (shortness of breath) 08/06/2014   - [facility] eval with cpst 02/20/2008 exercise testing demonstrateed normal functional capabilities and a normal cardiopulmonary response to exercise. - 09/16/2014  Walked RA x 3 laps @ 185 ft each stopped due to end of study/ nl pace/ no desat  - spirometry 09/16/2014 > no obst/ min restriction      Past Surgical History:  Procedure Laterality Date  . ANTERIOR CERVICAL CORPECTOMY N/A 09/15/2012   Procedure: Cervical Three-Four,Cervical Six-Seven Anterior cervical decompression/diskectomy fusion with Cervical Four Corpectomy;  Surgeon: Kristeen Miss, MD;  Location: [facility];  Service: Neurosurgery;  Laterality: N/A;  Cervical Three-Four,Cervical Six-Seven  Anterior cervical decompression/diskectomy fusion with Cervical four Corpectomy  . APPENDECTOMY  12  . CERVICAL DISC SURGERY    . CHOLECYSTECTOMY N/A 03/29/2017   Procedure: LAPAROSCOPIC CHOLECYSTECTOMY;  Surgeon: Florene Glen, MD;  Location: [facility]  ORS;  Service: General;  Laterality: N/A;  . KNEE SURGERY     bilateral  . POSTERIOR FUSION CERVICAL SPINE     Social History   Socioeconomic History  . Marital status: Divorced    Spouse name: Not on file  . Number of children: 0  . Years of education: college  . Highest education level: Not on file  Occupational History    Comment: [employer]  Tobacco Use  . Smoking status: Never Smoker  . Smokeless tobacco: Never Used  Substance and Sexual Activity  . Alcohol use: No    Alcohol/week: 0.0 standard drinks  . Drug use: No  . Sexual activity: Yes  Other Topics Concern  . Not on file  Social History Narrative   Patient lives at home alone and he single.    Patient works full time for [employer].   Education college.   Right handed.   Caffeine one coke cola in the mornings.   Social Determinants of Health   Financial Resource Strain:   . Difficulty of Paying Living Expenses:   Food Insecurity:   . Worried About Charity fundraiser in the Last Year:   . Arboriculturist in the Last Year:   Transportation Needs:   . Film/video editor (Medical):   Marland Kitchen Lack of Transportation (Non-Medical):   Physical Activity:   . Days of Exercise per Week:   . Minutes of Exercise per Session:   Stress:   . Feeling of Stress :   Social Connections:   . Frequency of Communication with Friends and Family:   . Frequency of Social Gatherings with Friends and Family:   . Attends Religious Services:   . Active Member of Clubs or Organizations:   . Attends Archivist Meetings:   Marland Kitchen Marital Status:   Intimate Partner Violence:   . Fear of Current or Ex-Partner:   . Emotionally Abused:   Marland Kitchen Physically Abused:   . Sexually Abused:    Social History   Social History Narrative   Patient lives at home alone and he single.    Patient works full time for [employer].   Education college.   Right handed.   Caffeine one coke cola in the mornings.     ROS: Negative.      PE: HEENT: Negative. Lungs: Clear. Cardio: RR.  Assessment/Plan:  Proceed with planned endoscopy.   Forest Gleason [facility] 10/23/2019

## 2019-10-23 NOTE — Anesthesia Preprocedure Evaluation (Signed)
Anesthesia Evaluation  Patient identified by MRN, date of birth, ID band Patient awake    Reviewed: Allergy & Precautions, H&P , NPO status , Patient's Chart, lab work & pertinent test results, reviewed documented beta blocker date and time   History of Anesthesia Complications (+) PROLONGED EMERGENCE and history of anesthetic complications  Airway Mallampati: III  TM Distance: >3 FB Neck ROM: limited    Dental  (+) Dental Advidsory Given   Pulmonary neg shortness of breath, asthma , sleep apnea , Recent URI , Resolved,    Pulmonary exam normal        Cardiovascular Exercise Tolerance: Good hypertension, (-) angina(-) CAD, (-) Past MI, (-) Cardiac Stents and (-) CABG (-) dysrhythmias (-) Valvular Problems/Murmurs     Neuro/Psych Seizures -, Well Controlled,  PSYCHIATRIC DISORDERS Anxiety Depression  Neuromuscular disease    GI/Hepatic Neg liver ROS, GERD  ,  Endo/Other  negative endocrine ROS  Renal/GU negative Renal ROS  negative genitourinary   Musculoskeletal   Abdominal   Peds  Hematology negative hematology ROS (+)   Anesthesia Other Findings Past Medical History: No date: Anxiety No date: Asthma No date: Back problem No date: Complication of anesthesia     Comment:  occ takes longer to wake No date: Depression No date: GERD (gastroesophageal reflux disease) No date: Hypertension No date: Migraine headache No date: Seizures (HCC)     Comment:  last 35 No date: Shortness of breath   Reproductive/Obstetrics negative OB ROS                             Anesthesia Physical  Anesthesia Plan  ASA: III  Anesthesia Plan: General   Post-op Pain Management:    Induction: Intravenous  PONV Risk Score and Plan: 2 and Propofol infusion and TIVA  Airway Management Planned: Natural Airway and Nasal Cannula  Additional Equipment:   Intra-op Plan:   Post-operative Plan:    Informed Consent: I have reviewed the patients History and Physical, chart, labs and discussed the procedure including the risks, benefits and alternatives for the proposed anesthesia with the patient or authorized representative who has indicated his/her understanding and acceptance.     Dental Advisory Given  Plan Discussed with: Anesthesiologist, CRNA and Surgeon  Anesthesia Plan Comments:         Anesthesia Quick Evaluation

## 2019-10-26 ENCOUNTER — Encounter: Payer: Self-pay | Admitting: General Surgery

## 2019-10-27 ENCOUNTER — Encounter: Payer: Self-pay | Admitting: Student in an Organized Health Care Education/Training Program

## 2019-10-27 ENCOUNTER — Ambulatory Visit
Payer: 59 | Attending: Student in an Organized Health Care Education/Training Program | Admitting: Student in an Organized Health Care Education/Training Program

## 2019-10-27 ENCOUNTER — Other Ambulatory Visit: Payer: Self-pay

## 2019-10-27 VITALS — BP 140/91 | HR 85 | Temp 96.8°F | Resp 16 | Ht 74.0 in | Wt 325.0 lb

## 2019-10-27 DIAGNOSIS — G959 Disease of spinal cord, unspecified: Secondary | ICD-10-CM

## 2019-10-27 DIAGNOSIS — M960 Pseudarthrosis after fusion or arthrodesis: Secondary | ICD-10-CM | POA: Diagnosis present

## 2019-10-27 DIAGNOSIS — G894 Chronic pain syndrome: Secondary | ICD-10-CM

## 2019-10-27 DIAGNOSIS — M47894 Other spondylosis, thoracic region: Secondary | ICD-10-CM | POA: Diagnosis not present

## 2019-10-27 DIAGNOSIS — Z981 Arthrodesis status: Secondary | ICD-10-CM

## 2019-10-27 DIAGNOSIS — M5414 Radiculopathy, thoracic region: Secondary | ICD-10-CM

## 2019-10-27 DIAGNOSIS — M5481 Occipital neuralgia: Secondary | ICD-10-CM

## 2019-10-27 DIAGNOSIS — M4712 Other spondylosis with myelopathy, cervical region: Secondary | ICD-10-CM | POA: Diagnosis not present

## 2019-10-27 LAB — SURGICAL PATHOLOGY

## 2019-10-27 MED ORDER — HYDROCODONE BITARTRATE ER 10 MG PO CP12: 10.0000 mg | ORAL_CAPSULE | Freq: Two times a day (BID) | ORAL | 0 refills | Status: DC

## 2019-10-27 MED ORDER — TIZANIDINE HCL 4 MG PO TABS: 4.0000 mg | ORAL_TABLET | Freq: Three times a day (TID) | ORAL | 2 refills | Status: DC | PRN

## 2019-10-27 MED ORDER — HYDROCODONE BITARTRATE ER 10 MG PO CP12: 10.0000 mg | ORAL_CAPSULE | Freq: Two times a day (BID) | ORAL | 0 refills | Status: AC

## 2019-10-27 MED ORDER — PREGABALIN 100 MG PO CAPS: 100.0000 mg | ORAL_CAPSULE | Freq: Three times a day (TID) | ORAL | 5 refills | Status: DC

## 2019-10-27 NOTE — Patient Instructions (Addendum)
Prescriptions for Hydrocodone,  Tizanidine, and Lyrica have been sent to your pharmacy.

## 2019-10-27 NOTE — Progress Notes (Signed)
PROVIDER NOTE: Information contained herein reflects review and annotations entered in association with encounter. Interpretation of such information and data should be left to medically-trained personnel. Information provided to patient can be located elsewhere in the medical record under "Patient Instructions". Document created using STT-dictation technology, any transcriptional errors that may result from process are unintentional.    Patient: Thomas Cole  Service Category: E/M  Provider: Gillis Santa, MD  DOB: [birth year]  DOS: 10/27/2019  Specialty: Interventional Pain Management  MRN: [id number]  Setting: Ambulatory outpatient  PCP: Derinda Late, MD  Type: Established Patient    Referring Provider: Derinda Late, MD  Location: Office  Delivery: Face-to-face     HPI  Reason for encounter: Thomas Cole, a 49 y.o. year old male, is here today for evaluation and management of his Bilateral occipital neuralgia [M54.81]. Thomas Cole primary complain today is Neck Pain Last encounter: Practice (09/23/2019). My last encounter with him was on 07/14/2019. Pertinent problems: Thomas Cole has Spondylosis, cervical, with myelopathy; Migraines; History of seizures; Generalized anxiety disorder; and Bilateral occipital neuralgia on their pertinent problem list. Pain Assessment: Severity of Chronic pain is reported as a 6 /10. Location: Neck  /head, both shoulders. Onset: More than a month ago. Quality: Aching, Dull, Sharp. Timing: Constant. Modifying factor(s): nothing. Vitals:  height is 6' 2"  (1.88 m) and weight is 325 lb (147.4 kg) (abnormal). His temporal temperature is 96.8 F (36 C) (abnormal). His blood pressure is 140/91 (abnormal) and his pulse is 85. His respiration is 16 and oxygen saturation is 97%.   Patient's last visit with me was on 09/24/2019.  Patient was having significant posterior dominant cervical headaches as well as pain between his scapular borders and trapezius region.  He  states that he is having difficulty manage his pain although he does find benefit with the hydrocodone twice a day.  He is having trouble performing ADLs.  His wife accompanies him today and is his primary caregiver.  He is on Cymbalta, nortriptyline, tizanidine as needed.  He has C3-C7 fusion with adjacent segment disease and associated cervical myelomalacia that is causing his symptoms.  Patient states that he has an appointment with his previous neurosurgeon, Dr. Aris Lot next week.  At the last visit, we discussed bilateral occipital nerve block as well as T1-T2 facet block for his pain symptoms however I think it is worthwhile and reasonable to wait until patient has been evaluated with Dr. Aris Lot to see if surgery will be planned for his symptoms.  Pharmacotherapy Assessment   10/02/2019  2   05/06/2019  Pregabalin 100 MG Capsule  90.00  30 Bi Lat   [id number]   [facility] (4800)   5/5  2.01 LME  Comm Ins   [place]  09/08/2019  2   09/08/2019  Hydrocodone-Acetamin 10-325 MG  60.00  30 Bi Lat   [id number]   [facility] (1123)   0/0  20.00 MME  Comm Ins   [place]      Monitoring: [place] PMP: PDMP reviewed during this encounter.       Pharmacotherapy: No side-effects or adverse reactions reported. Compliance: No problems identified. Effectiveness: Clinically acceptable.  Landis Martins, RN  10/27/2019  9:44 AM  Sign when Signing Visit Nursing Pain Medication Assessment:  Safety precautions to be maintained throughout the outpatient stay will include: orient to surroundings, keep bed in low position, maintain call bell within reach at all times, provide assistance with transfer out of bed and ambulation.  Medication Inspection  Compliance: Thomas Cole did not comply with our request to bring his pills to be counted. He was reminded that bringing the medication bottles, even when empty, is a requirement.  Medication: None brought in. Pill/Patch Count: None available to be counted. Bottle Appearance: No container available. Did not  bring bottle(s) to appointment. Filled Date: N/A Last Medication intake:  Today    UDS: No results found for: SUMMARY   ROS  Constitutional: Denies any fever or chills Gastrointestinal: No reported hemesis, hematochezia, vomiting, or acute GI distress Musculoskeletal: Denies any acute onset joint swelling, redness, loss of ROM, or weakness Neurological: Positive for loss of coordination, dizziness, weakness, balance issues  Medication Review  DULoxetine, HYDROcodone Bitartrate ER, HYDROcodone-acetaminophen, Vitamin D3, butalbital-acetaminophen-caffeine, carbidopa-levodopa, clonazePAM, hydrochlorothiazide, losartan, losartan-hydrochlorothiazide, nortriptyline, omeprazole, ondansetron, polyethylene glycol powder, pregabalin, propranolol, sucralfate, tiZANidine, valsartan, and vitamin B-12  History Review  Allergy: Thomas Cole is allergic to amitriptyline, bee venom, chlorhexidine, carbamazepine, meloxicam, morphine and related, and sulfa antibiotics. Drug: Thomas Cole  reports no history of drug use. Alcohol:  reports no history of alcohol use. Tobacco:  reports that he has never smoked. He has never used smokeless tobacco. Social: Thomas Cole  reports that he has never smoked. He has never used smokeless tobacco. He reports that he does not drink alcohol and does not use drugs. Medical:  has a past medical history of Anxiety, Asthma, Back problem, Benign essential hypertension (11/30/2016), Cervical spondylosis without myelopathy (10/10/6267), Complication of anesthesia, Depression, GERD (gastroesophageal reflux disease), Hypertension, Labile hypertension (03/19/2013), Major depressive disorder, recurrent episode, severe ([place]) (06/23/2015), Migraine headache, Migraines (11/11/2013), S/P appendectomy (04/09/2011), Seizure ([place]) (11/30/2016), Seizures ([place]), Shortness of breath, and SOB (shortness of breath) (08/06/2014). Surgical: Thomas Cole  has a past surgical history that includes Knee surgery; Posterior  fusion cervical spine; Cervical disc surgery; Appendectomy (12); Anterior cervical corpectomy (N/A, 09/15/2012); Cholecystectomy (N/A, 03/29/2017); Colonoscopy with propofol (N/A, 10/23/2019); and Esophagogastroduodenoscopy (egd) with propofol (N/A, 10/23/2019). Family: family history includes Arthritis in his father, maternal grandfather, maternal grandmother, mother, paternal grandfather, and paternal grandmother; Asthma in his mother; Coronary artery disease in his father and mother; Heart disease in his father, maternal grandfather, maternal grandmother, paternal grandfather, and paternal grandmother; Hypertension in his maternal grandfather, maternal grandmother, mother, paternal grandfather, and paternal grandmother; Mental illness in his mother.  Laboratory Chemistry Profile   Renal Lab Results  Component Value Date   BUN 13 04/21/2017   CREATININE 0.93 04/21/2017   LABCREA 79.3 03/05/2014   GFR 82.19 08/05/2014   GFRAA >60 04/21/2017   GFRNONAA >60 04/21/2017     Hepatic Lab Results  Component Value Date   AST 30 04/21/2017   ALT 23 04/21/2017   ALBUMIN 4.2 04/21/2017   ALKPHOS 86 04/21/2017   LIPASE 51 04/21/2017     Electrolytes Lab Results  Component Value Date   NA 138 04/21/2017   K 3.7 04/21/2017   CL 101 04/21/2017   CALCIUM 9.4 04/21/2017     Bone Lab Results  Component Value Date   VD25OH 31.95 03/05/2016     Inflammation (CRP: Acute Phase) (ESR: Chronic Phase) Lab Results  Component Value Date   CRP 0.3 (L) 04/29/2017   ESRSEDRATE 2 04/29/2017       Note: Above Lab results reviewed.  Recent Imaging Review  NM GASTRIC EMPTYING CLINICAL DATA:  Epigastric pain  EXAM: NUCLEAR MEDICINE GASTRIC EMPTYING SCAN  TECHNIQUE: After oral ingestion of radiolabeled meal, sequential abdominal images were obtained for 4 hours. Percentage of activity emptying  the stomach was calculated at 1 hour, 2 hour, 3 hour, and 4 hours.  RADIOPHARMACEUTICALS:  2.57 mCi  Tc-48msulfur colloid in standardized meal  COMPARISON:  Ultrasound 04/21/2017  FINDINGS: Expected location of the stomach in the left upper quadrant. Ingested meal empties the stomach gradually over the course of the study.  49% emptied at 1 hr ( normal >= 10%)  67% emptied at 2 hr ( normal >= 40%)  81% emptied at 3 hr ( normal >= 70%)  100% emptied at 4 hr ( normal >= 90%)  IMPRESSION: Normal gastric emptying study.  Electronically Signed   By: KDonavan FoilM.D.   On: 08/24/2019 16:55 Note: Reviewed        Physical Exam  General appearance: alert, cooperative, in moderate distress and Well nourished, well developed, and well hydrated. In no apparent acute distress Mental status: Alert, oriented x 3 (person, place, & time)       Respiratory: No evidence of acute respiratory distress Eyes: PERLA Vitals: BP (!) 140/91   Pulse 85   Temp (!) 96.8 F (36 C) (Temporal)   Resp 16   Ht 6' 2"  (1.88 m)   Wt (!) 325 lb (147.4 kg)   SpO2 97%   BMI 41.73 kg/m  BMI: Estimated body mass index is 41.73 kg/m as calculated from the following:   Height as of this encounter: 6' 2"  (1.88 m).   Weight as of this encounter: 325 lb (147.4 kg). Ideal: Ideal body weight: 82.2 kg (181 lb 3.5 oz) Adjusted ideal body weight: 108.3 kg (238 lb 11.7 oz)  Cervical Spine Area Exam  Skin & Axial Inspection: Well healed scar from previous spine surgery detected Alignment: Symmetrical Functional ROM: Pain restricted ROM, bilaterally Stability: No instability detected Muscle Tone/Strength: Functionally intact. No obvious neuro-muscular anomalies detected. Sensory (Neurological): Dermatomal pain pattern and neurogenic Palpation: No palpable anomalies             Upper Extremity (UE) Exam    Side: Right upper extremity  Side: Left upper extremity  Skin & Extremity Inspection: Skin color, temperature, and hair growth are WNL. No peripheral edema or cyanosis. No masses, redness, swelling,  asymmetry, or associated skin lesions. No contractures.  Skin & Extremity Inspection: Skin color, temperature, and hair growth are WNL. No peripheral edema or cyanosis. No masses, redness, swelling, asymmetry, or associated skin lesions. No contractures.  Functional ROM: Pain restricted ROM for shoulder and elbow  Functional ROM: Pain restricted ROM for shoulder and elbow  Muscle Tone/Strength: Functionally intact. No obvious neuro-muscular anomalies detected.  Muscle Tone/Strength: Functionally intact. No obvious neuro-muscular anomalies detected.  Sensory (Neurological): Unimpaired          Sensory (Neurological): Unimpaired          Palpation: No palpable anomalies              Palpation: No palpable anomalies              Provocative Test(s):  Phalen's test: deferred Tinel's test: deferred Apley's scratch test (touch opposite shoulder):  Action 1 (Across chest): Decreased ROM Action 2 (Overhead): Decreased ROM Action 3 (LB reach): Decreased ROM    Provocative Test(s):  Phalen's test: deferred Tinel's test: deferred Apley's scratch test (touch opposite shoulder):  Action 1 (Across chest): Decreased ROM Action 2 (Overhead): Decreased ROM Action 3 (LB reach): Decreased ROM   5 out of 5 strength bilateral upper extremity: Shoulder abduction, elbow flexion, elbow extension, thumb extension.   Assessment  Status Diagnosis  Persistent Persistent Persistent 1. Bilateral occipital neuralgia   2. Thoracic facet joint syndrome   3. Myelopathy of cervical spinal cord with cervical radiculopathy   4. Failed cervical fusion   5. Spondylosis, cervical, with myelopathy   6. H/O cervical spinal arthrodesis (C3-C7 fusion)   7. Thoracic radiculopathy   8. Chronic pain syndrome       Plan of Care   Thomas Cole has a current medication list which includes the following long-term medication(s): carbidopa-levodopa, clonazepam, omeprazole, propranolol, valsartan, carbidopa-levodopa,  losartan-hydrochlorothiazide, nortriptyline, pregabalin, propranolol, and sucralfate.  At this point, I have limited options for [facility] other than optimizing his medications.  While he does find benefit with hydrocodone, given that he has chronic, persistent pain that is very debilitating, discussed adding on long-acting hydrocodone 10 mg twice daily as needed to help his baseline pain.  He can continue to utilize immediate release hydrocodone when he has breakthrough or severe pain.  Recommend that he continue Lyrica 100 mg 3 times a day as prescribed along with the tizanidine and Zanaflex.  This is a fairly robust multimodal analgesic regimen.  I encouraged him to follow-up with Dr. Aris Lot as he has C2-C3 adjacent segment disease and associated myelomalacia that is causing him to be symptomatic: Dizziness, headaches, worsening pain, weakness, instability.  Patient will follow up with me in 6 weeks, pending on what Dr. Aris Lot says, can consider bilateral occipital nerve blocks, T1-T2 thoracic facet medial branch nerve block as previously discussed.  Patient and his wife endorsed understanding.  Pharmacotherapy (Medications Ordered): Meds ordered this encounter  Medications  . HYDROcodone Bitartrate ER 10 MG CP12    Sig: Take 10 mg by mouth every 12 (twelve) hours. Must last 30 days.    Dispense:  60 capsule    Refill:  0    Chronic Pain. (STOP Act - Not applicable). Fill one day early if closed on scheduled refill date.  Marland Kitchen HYDROcodone Bitartrate ER 10 MG CP12    Sig: Take 10 mg by mouth every 12 (twelve) hours. Must last 30 days.    Dispense:  60 capsule    Refill:  0    Chronic Pain. (STOP Act - Not applicable). Fill one day early if closed on scheduled refill date.  . pregabalin (LYRICA) 100 MG capsule    Sig: Take 1 capsule (100 mg total) by mouth 3 (three) times daily.    Dispense:  90 capsule    Refill:  5    Do not place this medication, or any other prescription from our practice, on  "Automatic Refill". Patient may have prescription filled one day early if pharmacy is closed on scheduled refill date.  Marland Kitchen tiZANidine (ZANAFLEX) 4 MG tablet    Sig: Take 1 tablet (4 mg total) by mouth every 8 (eight) hours as needed for muscle spasms.    Dispense:  90 tablet    Refill:  2   Orders:  Orders Placed This Encounter  Procedures  . ToxASSURE Select 13 (MW), Urine    Volume: 30 ml(s). Minimum 3 ml of urine is needed. Document temperature of fresh sample. Indications: Long term (current) use of opiate analgesic [phone])    Order Specific Question:   Release to patient    Answer:   Immediate   Follow-up plan:   Return in about 6 weeks (around 12/08/2019) for Medication Management, in person.     Diagnostic cervical facet medial branch nerve blocks C3-C7 b/l 05/06/19- did not  help Diagnostic thoracic facet medial branch nerve blocks T1-T2, consider bilateral occipital nerve block  Cervical/trapezius trigger point injections SPG block             Recent Visits Date Type Provider Dept  09/24/19 Telemedicine Gillis Santa, MD [facility]  09/08/19 Telemedicine Gillis Santa, MD [facility]  Showing recent visits within past 90 days and meeting all other requirements Today's Visits Date Type Provider Dept  10/27/19 Office Visit Gillis Santa, MD [facility]  Showing today's visits and meeting all other requirements Future Appointments Date Type Provider Dept  12/01/19 Appointment Gillis Santa, MD [facility]  Showing future appointments within next 90 days and meeting all other requirements  I discussed the assessment and treatment plan with the patient. The patient was provided an opportunity to ask questions and all were answered. The patient agreed with the plan and demonstrated an understanding of the instructions.  Patient advised to call back or seek an in-person evaluation if the symptoms or condition worsens.  Duration of  encounter: 70mnutes.  Note by: BGillis Santa MD Date: 10/27/2019; Time: 11:13 AM

## 2019-10-27 NOTE — Progress Notes (Signed)
Nursing Pain Medication Assessment:  Safety precautions to be maintained throughout the outpatient stay will include: orient to surroundings, keep bed in low position, maintain call bell within reach at all times, provide assistance with transfer out of bed and ambulation.  Medication Inspection Compliance: Thomas Cole did not comply with our request to bring his pills to be counted. He was reminded that bringing the medication bottles, even when empty, is a requirement.  Medication: None brought in. Pill/Patch Count: None available to be counted. Bottle Appearance: No container available. Did not bring bottle(s) to appointment. Filled Date: N/A Last Medication intake:  Today

## 2019-10-30 LAB — TOXASSURE SELECT 13 (MW), URINE

## 2019-11-15 ENCOUNTER — Encounter: Payer: Self-pay | Admitting: Student in an Organized Health Care Education/Training Program

## 2019-12-01 ENCOUNTER — Other Ambulatory Visit: Payer: Self-pay

## 2019-12-01 ENCOUNTER — Ambulatory Visit
Payer: 59 | Attending: Student in an Organized Health Care Education/Training Program | Admitting: Student in an Organized Health Care Education/Training Program

## 2019-12-01 ENCOUNTER — Encounter: Payer: Self-pay | Admitting: Student in an Organized Health Care Education/Training Program

## 2019-12-01 VITALS — BP 112/79 | HR 99 | Temp 97.3°F | Resp 18 | Ht 75.0 in | Wt 320.0 lb

## 2019-12-01 DIAGNOSIS — M5481 Occipital neuralgia: Secondary | ICD-10-CM | POA: Diagnosis present

## 2019-12-01 DIAGNOSIS — M4712 Other spondylosis with myelopathy, cervical region: Secondary | ICD-10-CM | POA: Diagnosis not present

## 2019-12-01 DIAGNOSIS — M47812 Spondylosis without myelopathy or radiculopathy, cervical region: Secondary | ICD-10-CM | POA: Diagnosis present

## 2019-12-01 DIAGNOSIS — M47894 Other spondylosis, thoracic region: Secondary | ICD-10-CM

## 2019-12-01 DIAGNOSIS — M960 Pseudarthrosis after fusion or arthrodesis: Secondary | ICD-10-CM

## 2019-12-01 DIAGNOSIS — G894 Chronic pain syndrome: Secondary | ICD-10-CM

## 2019-12-01 DIAGNOSIS — G959 Disease of spinal cord, unspecified: Secondary | ICD-10-CM

## 2019-12-01 DIAGNOSIS — M5414 Radiculopathy, thoracic region: Secondary | ICD-10-CM

## 2019-12-01 DIAGNOSIS — F411 Generalized anxiety disorder: Secondary | ICD-10-CM | POA: Diagnosis present

## 2019-12-01 DIAGNOSIS — Z981 Arthrodesis status: Secondary | ICD-10-CM

## 2019-12-01 MED ORDER — HYDROCODONE-ACETAMINOPHEN 10-325 MG PO TABS: 1.0000 | ORAL_TABLET | Freq: Three times a day (TID) | ORAL | 0 refills | Status: DC | PRN

## 2019-12-01 MED ORDER — HYDROCODONE BITARTRATE ER 10 MG PO CP12: 10.0000 mg | ORAL_CAPSULE | Freq: Two times a day (BID) | ORAL | 0 refills | Status: DC

## 2019-12-01 MED ORDER — HYDROCODONE BITARTRATE ER 10 MG PO CP12: 10.0000 mg | ORAL_CAPSULE | Freq: Two times a day (BID) | ORAL | 0 refills | Status: AC

## 2019-12-01 NOTE — Progress Notes (Signed)
PROVIDER NOTE: Information contained herein reflects review and annotations entered in association with encounter. Interpretation of such information and data should be left to medically-trained personnel. Information provided to patient can be located elsewhere in the medical record under "Patient Instructions". Document created using STT-dictation technology, any transcriptional errors that may result from process are unintentional.    Patient: Thomas Cole  Service Category: E/M  Provider: Gillis Santa, MD  DOB: 07-Jan-1971  DOS: 12/01/2019  Specialty: Interventional Pain Management  MRN: [id number]  Setting: Ambulatory outpatient  PCP: Derinda Late, MD  Type: Established Patient    Referring Provider: Derinda Late, MD  Location: Office  Delivery: Face-to-face     HPI  Reason for encounter: Thomas Cole, a 49 y.o. year old male, is here today for evaluation and management of his Bilateral occipital neuralgia [M54.81]. Thomas Cole primary complain today is Neck Pain and Back Pain (lower) Last encounter: Practice (10/27/2019). My last encounter with him was on 10/27/2019. Pertinent problems: Thomas Cole has Spondylosis, cervical, with myelopathy; Migraines; History of seizures; Generalized anxiety disorder; and Bilateral occipital neuralgia on their pertinent problem list. Pain Assessment: Severity of Chronic pain is reported as a 7 /10. Location: Neck  / . Onset: More than a month ago. Quality: Aching, Sharp. Timing: Constant. Modifying factor(s): nothing. Vitals:  height is 6' 3"  (1.905 m) and weight is 320 lb (145.2 kg) (abnormal). His temporal temperature is 97.3 F (36.3 C) (abnormal). His blood pressure is 112/79 and his pulse is 99. His respiration is 18 and oxygen saturation is 99%.   Patient follows up today for medication management accompanied by his wife.  Patient's last visit with me was on 10/27/2019.  At that visit, we added extended release hydrocodone 10 mg to take twice a day.   Patient has only been taking this medication at night.  Not the daytime dose.  He has noticed some analgesic benefit in reduction in his pain although he continues to struggle with severe neck pain, bilateral shoulder pain as well as lower extremity cramps.  Of note he was supposed to have a visit with his prior neurosurgeon, Dr. Aris Lot which was rescheduled due to provider availability.  He is scheduled to have this visit in October.  We discussed continuing with hydrocodone extended release 10 mg twice a day.  I instructed patient to utilize hydrocodone 10 mg during the day for breakthrough pain.  Continue Lyrica and multimodal analgesics as prescribed.  UDS up-to-date and appropriate. Pharmacotherapy Assessment   11/16/2019  2   10/27/2019  Hydrocodone Er 10 MG Capsule  60.00  30 Bi Lat   [id number]   Gib (4800)   0/0  20.00 MME  Comm Ins   [place]  11/10/2019  2   11/10/2019  Clonazepam 1 MG Tablet  30.00  30 Au Kon   [id number]   [facility] (4800)   0/0  2.00 LME  Comm Ins   [place]  11/07/2019  2   09/24/2019  Hydrocodone-Acetamin 10-325 MG  90.00  30 Bi Lat   [id number]   [facility] (1123)   0/0  30.00 MME  Comm Ins   [place]       Monitoring:  PMP: PDMP reviewed during this encounter.       Pharmacotherapy: No side-effects or adverse reactions reported. Compliance: No problems identified. Effectiveness: Clinically acceptable.  Thomas Martins, RN  12/01/2019  3:03 PM  Sign when Signing Visit Nursing Pain Medication Assessment:  Safety precautions to be maintained  throughout the outpatient stay will include: orient to surroundings, keep bed in low position, maintain call bell within reach at all times, provide assistance with transfer out of bed and ambulation.  Medication Inspection Compliance: Pill count conducted under aseptic conditions, in front of the patient. Neither the pills nor the bottle was removed from the patient's sight at any time. Once count was completed pills were immediately returned to the patient  in their original bottle.  Medication #1: Hydrocodone Bitartrate  ER Pill/Patch Count: 46 of 60 pills remain Pill/Patch Appearance: Markings consistent with prescribed medication Bottle Appearance: Standard pharmacy container. Clearly labeled. Filled Date: 11/16/2019 Last Medication intake:  Yesterday  Medication #2: Hydrocodone/APAP Pill/Patch Count: 28 of 90 pills remain Pill/Patch Appearance: Markings consistent with prescribed medication Bottle Appearance: Standard pharmacy container. Clearly labeled. Filled Date: 11/07/2019 Last Medication intake:  Today    UDS:  Summary  Date Value Ref Range Status  10/27/2019 Note  Final    Comment:    ==================================================================== [facility] (MW) ==================================================================== Test                             Result       Flag       Units  Drug Present and Declared for Prescription Verification   7-aminoclonazepam              103          EXPECTED   ng/mg creat    7-aminoclonazepam is an expected metabolite of clonazepam. Source of    clonazepam is a scheduled prescription medication.    Hydrocodone                    413          EXPECTED   ng/mg creat   Hydromorphone                  122          EXPECTED   ng/mg creat   Norhydrocodone                 2478         EXPECTED   ng/mg creat    Sources of hydrocodone include scheduled prescription medications.    Hydromorphone and norhydrocodone are expected metabolites of    hydrocodone. Hydromorphone is also available as a scheduled    prescription medication.  Drug Absent but Declared for Prescription Verification   Butalbital                     Not Detected UNEXPECTED ==================================================================== Test                      Result    Flag   Units      Ref Range   Creatinine              120              mg/dL       >=20 ==================================================================== Declared Medications:  The flagging and interpretation on this report are based on the  following declared medications.  Unexpected results may arise from  inaccuracies in the declared medications.   **Note: The testing scope of this panel includes these medications:   Butalbital (Fioricet)  Clonazepam (Klonopin)  Hydrocodone   **Note: The testing scope of this panel does not include the  following reported  medications:   Acetaminophen (Fioricet)  Acetaminophen  Caffeine (Fioricet)  Carbidopa (Sinemet)  Cholecalciferol  Cyanocobalamin  Duloxetine (Cymbalta)  Hydrochlorothiazide  Levodopa (Sinemet)  Losartan (Cozaar)  Nortriptyline (Pamelor)  Omeprazole (Prilosec)  Ondansetron (Zofran)  Polyethylene Glycol (MiraLAX)  Pregabalin (Lyrica)  Propranolol (Inderal)  Sucralfate (Carafate)  Tizanidine (Zanaflex)  Valsartan (Diovan) ==================================================================== For clinical consultation, please call [phone]. ====================================================================      ROS  Constitutional: Denies any fever or chills Gastrointestinal: No reported hemesis, hematochezia, vomiting, or acute GI distress Musculoskeletal: Headaches, neck pain, bilateral leg pain Neurological: No reported episodes of acute onset apraxia, aphasia, dysarthria, agnosia, amnesia, paralysis, loss of coordination, or loss of consciousness  Medication Review  DULoxetine, HYDROcodone Bitartrate ER, HYDROcodone-acetaminophen, Vitamin D3, butalbital-acetaminophen-caffeine, carbidopa-levodopa, clonazePAM, entacapone, hydrochlorothiazide, losartan, losartan-hydrochlorothiazide, nortriptyline, omeprazole, ondansetron, polyethylene glycol powder, pregabalin, propranolol, sucralfate, tiZANidine, valsartan, and vitamin B-12  History Review  Allergy: Thomas Cole is allergic to  amitriptyline, bee venom, chlorhexidine, carbamazepine, meloxicam, morphine and related, and sulfa antibiotics. Drug: Thomas Cole  reports no history of drug use. Alcohol:  reports no history of alcohol use. Tobacco:  reports that he has never smoked. He has never used smokeless tobacco. Social: Thomas Cole  reports that he has never smoked. He has never used smokeless tobacco. He reports that he does not drink alcohol and does not use drugs. Medical:  has a past medical history of Anxiety, Asthma, Back problem, Benign essential hypertension (11/30/2016), Cervical spondylosis without myelopathy (08/27/7822), Complication of anesthesia, Depression, GERD (gastroesophageal reflux disease), Hypertension, Labile hypertension (03/19/2013), Major depressive disorder, recurrent episode, severe ([place]) (06/23/2015), Migraine headache, Migraines (11/11/2013), S/P appendectomy (04/09/2011), Seizure ([place]) (11/30/2016), Seizures ([place]), Shortness of breath, and SOB (shortness of breath) (08/06/2014). Surgical: Thomas Cole  has a past surgical history that includes Knee surgery; Posterior fusion cervical spine; Cervical disc surgery; Appendectomy (12); Anterior cervical corpectomy (N/A, 09/15/2012); Cholecystectomy (N/A, 03/29/2017); Colonoscopy with propofol (N/A, 10/23/2019); and Esophagogastroduodenoscopy (egd) with propofol (N/A, 10/23/2019). Family: family history includes Arthritis in his father, maternal grandfather, maternal grandmother, mother, paternal grandfather, and paternal grandmother; Asthma in his mother; Coronary artery disease in his father and mother; Heart disease in his father, maternal grandfather, maternal grandmother, paternal grandfather, and paternal grandmother; Hypertension in his maternal grandfather, maternal grandmother, mother, paternal grandfather, and paternal grandmother; Mental illness in his mother.  Laboratory Chemistry Profile   Renal Lab Results  Component Value Date   BUN 13 04/21/2017    CREATININE 0.93 04/21/2017   LABCREA 79.3 03/05/2014   GFR 82.19 08/05/2014   GFRAA >60 04/21/2017   GFRNONAA >60 04/21/2017     Hepatic Lab Results  Component Value Date   AST 30 04/21/2017   ALT 23 04/21/2017   ALBUMIN 4.2 04/21/2017   ALKPHOS 86 04/21/2017   LIPASE 51 04/21/2017     Electrolytes Lab Results  Component Value Date   NA 138 04/21/2017   K 3.7 04/21/2017   CL 101 04/21/2017   CALCIUM 9.4 04/21/2017     Bone Lab Results  Component Value Date   VD25OH 31.95 03/05/2016     Inflammation (CRP: Acute Phase) (ESR: Chronic Phase) Lab Results  Component Value Date   CRP 0.3 (L) 04/29/2017   ESRSEDRATE 2 04/29/2017       Note: Above Lab results reviewed.  Recent Imaging Review  NM GASTRIC EMPTYING CLINICAL DATA:  Epigastric pain  EXAM: NUCLEAR MEDICINE GASTRIC EMPTYING SCAN  TECHNIQUE: After oral ingestion of radiolabeled meal, sequential abdominal images were obtained for 4 hours. Percentage  of activity emptying the stomach was calculated at 1 hour, 2 hour, 3 hour, and 4 hours.  RADIOPHARMACEUTICALS:  2.57 mCi Tc-67msulfur colloid in standardized meal  COMPARISON:  Ultrasound 04/21/2017  FINDINGS: Expected location of the stomach in the left upper quadrant. Ingested meal empties the stomach gradually over the course of the study.  49% emptied at 1 hr ( normal >= 10%)  67% emptied at 2 hr ( normal >= 40%)  81% emptied at 3 hr ( normal >= 70%)  100% emptied at 4 hr ( normal >= 90%)  IMPRESSION: Normal gastric emptying study.  Electronically Signed   By: KDonavan FoilM.D.   On: 08/24/2019 16:55 Note: Reviewed        Physical Exam  General appearance: Well nourished, well developed, and well hydrated. In no apparent acute distress Mental status: Alert, oriented x 3 (person, place, & time)       Respiratory: No evidence of acute respiratory distress Eyes: PERLA Vitals: BP 112/79   Pulse 99   Temp (!) 97.3 F (36.3 C) (Temporal)    Resp 18   Ht 6' 3"  (1.905 m)   Wt (!) 320 lb (145.2 kg)   SpO2 99%   BMI 40.00 kg/m  BMI: Estimated body mass index is 40 kg/m as calculated from the following:   Height as of this encounter: 6' 3"  (1.905 m).   Weight as of this encounter: 320 lb (145.2 kg). Ideal: Ideal body weight: 84.5 kg (186 lb 4.6 oz) Adjusted ideal body weight: 108.8 kg (239 lb 12.4 oz)  Cervical Spine Area Exam  Skin & Axial Inspection: Well healed scar from previous spine surgery detected Alignment: Symmetrical Functional ROM: Pain restricted ROM, bilaterally Stability: No instability detected Muscle Tone/Strength: Functionally intact. No obvious neuro-muscular anomalies detected. Sensory (Neurological): Dermatomal pain pattern and neurogenic Palpation: No palpable anomalies              Upper Extremity (UE) Exam    Side: Right upper extremity  Side: Left upper extremity  Skin & Extremity Inspection: Skin color, temperature, and hair growth are WNL. No peripheral edema or cyanosis. No masses, redness, swelling, asymmetry, or associated skin lesions. No contractures.  Skin & Extremity Inspection: Skin color, temperature, and hair growth are WNL. No peripheral edema or cyanosis. No masses, redness, swelling, asymmetry, or associated skin lesions. No contractures.  Functional ROM: Pain restricted ROM for shoulder and elbow  Functional ROM: Pain restricted ROM for shoulder and elbow  Muscle Tone/Strength: Functionally intact. No obvious neuro-muscular anomalies detected.  Muscle Tone/Strength: Functionally intact. No obvious neuro-muscular anomalies detected.  Sensory (Neurological): Unimpaired          Sensory (Neurological): Unimpaired          Palpation: No palpable anomalies              Palpation: No palpable anomalies              Provocative Test(s):  Phalen's test: deferred Tinel's test: deferred Apley's scratch test (touch opposite shoulder):  Action 1 (Across chest): Decreased ROM Action 2  (Overhead): Decreased ROM Action 3 (LB reach): Decreased ROM    Provocative Test(s):  Phalen's test: deferred Tinel's test: deferred Apley's scratch test (touch opposite shoulder):  Action 1 (Across chest): Decreased ROM Action 2 (Overhead): Decreased ROM Action 3 (LB reach): Decreased ROM   5 out of 5 strength bilateral upper extremity: Shoulder abduction, elbow flexion, elbow extension, thumb extension.   Patient presents today in  wheelchair.  Difficulty ambulating.  Pain with lumbar extension.  Assessment   Status Diagnosis  Persistent Persistent Persistent 1. Bilateral occipital neuralgia   2. Thoracic facet joint syndrome   3. Myelopathy of cervical spinal cord with cervical radiculopathy   4. Thoracic radiculopathy   5. Failed cervical fusion   6. Spondylosis, cervical, with myelopathy   7. H/O cervical spinal arthrodesis (C3-C7 fusion)   8. Chronic pain syndrome   9. Cervical facet joint syndrome   10. Generalized anxiety disorder       Plan of Care   Thomas Cole has a current medication list which includes the following long-term medication(s): carbidopa-levodopa, carbidopa-levodopa, clonazepam, nortriptyline, omeprazole, pregabalin, propranolol, propranolol, sucralfate, losartan-hydrochlorothiazide, and valsartan.  Patient has upcoming appointment with Dr. Aris Lot.  I encouraged him to continue with that and reviews his most recent cervical MRI with Dr. Aris Lot. Regards to medication management specifically his chronic opioid therapy, recommend patient continue with extended release hydrocodone 10 mg twice a day.  Discussed with patient's wife who is his primary caregiver that it is okay to give him this medication in the morning at night.  He can utilize his immediate release hydrocodone up to 3 times a day as needed for breakthrough pain.  Continue Lyrica and multimodal analgesics as prescribed. Follow-up in 2 months for medication management and after he has  seen Dr. Aris Lot to determine whether surgical intervention is warranted.  Pharmacotherapy (Medications Ordered): Meds ordered this encounter  Medications  . HYDROcodone-acetaminophen (NORCO) 10-325 MG tablet    Sig: Take 1 tablet by mouth 3 (three) times daily as needed. For chronic pain syndrome. Each Rx to last 30 days.    Dispense:  90 tablet    Refill:  0  . HYDROcodone-acetaminophen (NORCO) 10-325 MG tablet    Sig: Take 1 tablet by mouth 3 (three) times daily as needed. For chronic pain syndrome. Each Rx to last 30 days.    Dispense:  90 tablet    Refill:  0  . HYDROcodone Bitartrate ER 10 MG CP12    Sig: Take 10 mg by mouth every 12 (twelve) hours. Must last 30 days.    Dispense:  60 capsule    Refill:  0    Chronic Pain. (STOP Act - Not applicable). Fill one day early if closed on scheduled refill date.  Marland Kitchen HYDROcodone Bitartrate ER 10 MG CP12    Sig: Take 10 mg by mouth every 12 (twelve) hours. Must last 30 days.    Dispense:  60 capsule    Refill:  0    Chronic Pain. (STOP Act - Not applicable). Fill one day early if closed on scheduled refill date.    Follow-up plan:   Return in about 8 weeks (around 01/26/2020) for Medication Management, in person.     Diagnostic cervical facet medial branch nerve blocks C3-C7 b/l 05/06/19- did not help Diagnostic thoracic facet medial branch nerve blocks T1-T2, consider bilateral occipital nerve block  Cervical/trapezius trigger point injections SPG block              Recent Visits Date Type Provider Dept  10/27/19 Office Visit Gillis Santa, MD [facility]  09/24/19 Telemedicine Gillis Santa, MD [facility]  09/08/19 Telemedicine Gillis Santa, MD [facility]  Showing recent visits within past 90 days and meeting all other requirements Today's Visits Date Type Provider Dept  12/01/19 Office Visit Gillis Santa, MD [facility]  Showing today's visits and meeting all  other requirements Future  Appointments Date Type Provider Dept  01/26/20 Appointment Gillis Santa, MD [facility]  Showing future appointments within next 90 days and meeting all other requirements  I discussed the assessment and treatment plan with the patient. The patient was provided an opportunity to ask questions and all were answered. The patient agreed with the plan and demonstrated an understanding of the instructions.  Patient advised to call back or seek an in-person evaluation if the symptoms or condition worsens.  Duration of encounter: 30 minutes.  Note by: Gillis Santa, MD Date: 12/01/2019; Time: 3:40 PM

## 2019-12-01 NOTE — Progress Notes (Signed)
Nursing Pain Medication Assessment:  Safety precautions to be maintained throughout the outpatient stay will include: orient to surroundings, keep bed in low position, maintain call bell within reach at all times, provide assistance with transfer out of bed and ambulation.  Medication Inspection Compliance: Pill count conducted under aseptic conditions, in front of the patient. Neither the pills nor the bottle was removed from the patient's sight at any time. Once count was completed pills were immediately returned to the patient in their original bottle.  Medication #1: Hydrocodone Bitartrate  ER Pill/Patch Count: 46 of 60 pills remain Pill/Patch Appearance: Markings consistent with prescribed medication Bottle Appearance: Standard pharmacy container. Clearly labeled. Filled Date: 11/16/2019 Last Medication intake:  Yesterday  Medication #2: Hydrocodone/APAP Pill/Patch Count: 28 of 90 pills remain Pill/Patch Appearance: Markings consistent with prescribed medication Bottle Appearance: Standard pharmacy container. Clearly labeled. Filled Date: 11/07/2019 Last Medication intake:  Today

## 2020-01-26 ENCOUNTER — Ambulatory Visit
Payer: 59 | Attending: Student in an Organized Health Care Education/Training Program | Admitting: Student in an Organized Health Care Education/Training Program

## 2020-01-26 ENCOUNTER — Other Ambulatory Visit: Payer: Self-pay

## 2020-01-26 ENCOUNTER — Encounter: Payer: Self-pay | Admitting: Student in an Organized Health Care Education/Training Program

## 2020-01-26 VITALS — BP 115/85 | HR 63 | Temp 96.4°F | Resp 16 | Ht 74.0 in | Wt 325.0 lb

## 2020-01-26 DIAGNOSIS — Z981 Arthrodesis status: Secondary | ICD-10-CM | POA: Diagnosis not present

## 2020-01-26 DIAGNOSIS — M5412 Radiculopathy, cervical region: Secondary | ICD-10-CM | POA: Diagnosis present

## 2020-01-26 DIAGNOSIS — M960 Pseudarthrosis after fusion or arthrodesis: Secondary | ICD-10-CM

## 2020-01-26 DIAGNOSIS — G894 Chronic pain syndrome: Secondary | ICD-10-CM

## 2020-01-26 DIAGNOSIS — M5414 Radiculopathy, thoracic region: Secondary | ICD-10-CM

## 2020-01-26 DIAGNOSIS — M47812 Spondylosis without myelopathy or radiculopathy, cervical region: Secondary | ICD-10-CM

## 2020-01-26 DIAGNOSIS — M4712 Other spondylosis with myelopathy, cervical region: Secondary | ICD-10-CM

## 2020-01-26 DIAGNOSIS — M47894 Other spondylosis, thoracic region: Secondary | ICD-10-CM

## 2020-01-26 DIAGNOSIS — G959 Disease of spinal cord, unspecified: Secondary | ICD-10-CM | POA: Diagnosis not present

## 2020-01-26 MED ORDER — TIZANIDINE HCL 4 MG PO TABS: 4.0000 mg | ORAL_TABLET | Freq: Three times a day (TID) | ORAL | 2 refills | Status: DC | PRN

## 2020-01-26 MED ORDER — BUTALBITAL-APAP-CAFFEINE 50-325-40 MG PO TABS: 1.0000 | ORAL_TABLET | Freq: Two times a day (BID) | ORAL | 1 refills | Status: DC | PRN

## 2020-01-26 MED ORDER — HYDROCODONE BITARTRATE ER 10 MG PO CP12: 10.0000 mg | ORAL_CAPSULE | Freq: Two times a day (BID) | ORAL | 0 refills | Status: AC

## 2020-01-26 MED ORDER — NORTRIPTYLINE HCL 25 MG PO CAPS
50.0000 mg | ORAL_CAPSULE | Freq: Every day | ORAL | 5 refills | Status: DC
Start: 2020-01-26 — End: 2020-09-08

## 2020-01-26 MED ORDER — HYDROCODONE BITARTRATE ER 10 MG PO CP12: 10.0000 mg | ORAL_CAPSULE | Freq: Two times a day (BID) | ORAL | 0 refills | Status: DC

## 2020-01-26 MED ORDER — NORTRIPTYLINE HCL 25 MG PO CAPS: 50.0000 mg | ORAL_CAPSULE | Freq: Every day | ORAL | 5 refills | Status: DC

## 2020-01-26 MED ORDER — HYDROCODONE-ACETAMINOPHEN 10-325 MG PO TABS: 1.0000 | ORAL_TABLET | Freq: Three times a day (TID) | ORAL | 0 refills | Status: DC | PRN

## 2020-01-26 NOTE — Progress Notes (Signed)
PROVIDER NOTE: Information contained herein reflects review and annotations entered in association with encounter. Interpretation of such information and data should be left to medically-trained personnel. Information provided to patient can be located elsewhere in the medical record under "Patient Instructions". Document created using STT-dictation technology, any transcriptional errors that may result from process are unintentional.    Patient: Thomas Cole  Service Category: E/M  Provider: Gillis Santa, MD  DOB: April 26, 1970  DOS: 01/26/2020  Specialty: Interventional Pain Management  MRN: [id number]  Setting: Ambulatory outpatient  PCP: Thomas Late, MD  Type: Established Patient    Referring Provider: Derinda Late, MD  Location: Office  Delivery: Face-to-face     HPI  [name], a 49 y.o. year old male, is here today because of his Spondylosis, cervical, with myelopathy [M47.12]. Thomas Cole primary complain today is Neck Pain Last encounter: My last encounter with him was on 12/01/2019. Pertinent problems: Thomas Cole has Spondylosis, cervical, with myelopathy; Migraines; History of seizures; Generalized anxiety disorder; and Bilateral occipital neuralgia on their pertinent problem list. Pain Assessment: Severity of Chronic pain is reported as a 10-Worst pain ever/10. Location: Neck  /both shoulders, head. Onset: More than a month ago. Quality: Aching, Sharp. Timing: Constant. Modifying factor(s): medications. Vitals:  height is 6' 2"  (1.88 m) and weight is 325 lb (147.4 kg) (abnormal). His temporal temperature is 96.4 F (35.8 C) (abnormal). His blood pressure is 115/85 and his pulse is 63. His respiration is 16 and oxygen saturation is 98%.   Reason for encounter: medication management.   Patient presents today for medication management.  Per himself and his wife, he is noticing better analgesic benefit with long-acting hydrocodone, 10 mg twice daily along with immediate release  hydrocodone 10 mg 3 times daily as needed for breakthrough pain.  Of note he saw Dr. Aris Cole with neurosurgery.  Cervical MRI was completed.  Fairly similar to his previous MRI.  Sequelae of myelomalacia.  Patient also has MS.  Both of these together contribute to his severe pain flares.  He has an upcoming phone consultation with Dr. Aris Cole.  He was able to go to the beach with his wife and got into a pool there and states that this was helpful for his pain.  He would like to continue with pool therapy which I encouraged him to do.  Pharmacotherapy Assessment   Analgesic: Extended release hydrocodone 10 mg twice daily, immediately release hydrocodone 10 mg 3 times daily as needed Monitoring: [place] PMP: PDMP reviewed during this encounter.       Pharmacotherapy: No side-effects or adverse reactions reported. Compliance: No problems identified. Effectiveness: Clinically acceptable.  Thomas Martins, RN  01/26/2020  1:05 PM  Sign when Signing Visit Nursing Pain Medication Assessment:  Safety precautions to be maintained throughout the outpatient stay will include: orient to surroundings, keep bed in low position, maintain call bell within reach at all times, provide assistance with transfer out of bed and ambulation.  Medication Inspection Compliance: Pill count conducted under aseptic conditions, in front of the patient. Neither the pills nor the bottle was removed from the patient's sight at any time. Once count was completed pills were immediately returned to the patient in their original bottle.  Medication #1: Hydrocodone/APAP Pill/Patch Count: 90 of 90 pills remain Pill/Patch Appearance: Markings consistent with prescribed medication Bottle Appearance: Standard pharmacy container. Clearly labeled. Filled Date: 01/06/2020 Last Medication intake:  Today  Medication #2: Hydrococone Bitartrate Pill/Patch Count: 49 of 60 pills remain  Pill/Patch Appearance: Markings consistent with prescribed  medication Bottle Appearance: Standard pharmacy container. Clearly labeled. Filled Date: 01/14/2020 Last Medication intake:  Today    UDS:  Summary  Date Value Ref Range Status  10/27/2019 Note  Final    Comment:    ==================================================================== [facility] (MW) ==================================================================== Test                             Result       Flag       Units  Drug Present and Declared for Prescription Verification   7-aminoclonazepam              103          EXPECTED   ng/mg creat    7-aminoclonazepam is an expected metabolite of clonazepam. Source of    clonazepam is a scheduled prescription medication.    Hydrocodone                    413          EXPECTED   ng/mg creat   Hydromorphone                  122          EXPECTED   ng/mg creat   Norhydrocodone                 2478         EXPECTED   ng/mg creat    Sources of hydrocodone include scheduled prescription medications.    Hydromorphone and norhydrocodone are expected metabolites of    hydrocodone. Hydromorphone is also available as a scheduled    prescription medication.  Drug Absent but Declared for Prescription Verification   Butalbital                     Not Detected UNEXPECTED ==================================================================== Test                      Result    Flag   Units      Ref Range   Creatinine              120              mg/dL      >=20 ==================================================================== Declared Medications:  The flagging and interpretation on this report are based on the  following declared medications.  Unexpected results may arise from  inaccuracies in the declared medications.   **Note: The testing scope of this panel includes these medications:   Butalbital (Fioricet)  Clonazepam (Klonopin)  Hydrocodone   **Note: The testing scope of this panel does not include the  following  reported medications:   Acetaminophen (Fioricet)  Acetaminophen  Caffeine (Fioricet)  Carbidopa (Sinemet)  Cholecalciferol  Cyanocobalamin  Duloxetine (Cymbalta)  Hydrochlorothiazide  Levodopa (Sinemet)  Losartan (Cozaar)  Nortriptyline (Pamelor)  Omeprazole (Prilosec)  Ondansetron (Zofran)  Polyethylene Glycol (MiraLAX)  Pregabalin (Lyrica)  Propranolol (Inderal)  Sucralfate (Carafate)  Tizanidine (Zanaflex)  Valsartan (Diovan) ==================================================================== For clinical consultation, please call [phone]. ====================================================================      ROS  Constitutional: Denies any fever or chills Gastrointestinal: No reported hemesis, hematochezia, vomiting, or acute GI distress Musculoskeletal: Neck pain Neurological: No reported episodes of acute onset apraxia, aphasia, dysarthria, agnosia, amnesia, paralysis, loss of coordination, or loss of consciousness  Medication Review  DULoxetine, HYDROcodone Bitartrate ER, HYDROcodone-acetaminophen, Vitamin D3, butalbital-acetaminophen-caffeine,  carbidopa-levodopa, clonazePAM, entacapone, hydrochlorothiazide, losartan, losartan-hydrochlorothiazide, nortriptyline, ondansetron, polyethylene glycol powder, pregabalin, propranolol, sucralfate, tiZANidine, valsartan, and vitamin B-12  History Review  Allergy: Mr. Bertha is allergic to amitriptyline, bee venom, chlorhexidine, carbamazepine, meloxicam, morphine and related, and sulfa antibiotics. Drug: Mr. Upchurch  reports no history of drug use. Alcohol:  reports no history of alcohol use. Tobacco:  reports that he has never smoked. He has never used smokeless tobacco. Social: Mr. Whack  reports that he has never smoked. He has never used smokeless tobacco. He reports that he does not drink alcohol and does not use drugs. Medical:  has a past medical history of Anxiety, Asthma, Back problem, Benign essential  hypertension (11/30/2016), Cervical spondylosis without myelopathy (8/58/8502), Complication of anesthesia, Depression, GERD (gastroesophageal reflux disease), Hypertension, Labile hypertension (03/19/2013), Major depressive disorder, recurrent episode, severe ([place]) (06/23/2015), Migraine headache, Migraines (11/11/2013), S/P appendectomy (04/09/2011), Seizure ([place]) (11/30/2016), Seizures ([place]), Shortness of breath, and SOB (shortness of breath) (08/06/2014). Surgical: Mr. Buchmann  has a past surgical history that includes Knee surgery; Posterior fusion cervical spine; Cervical disc surgery; Appendectomy (12); Anterior cervical corpectomy (N/A, 09/15/2012); Cholecystectomy (N/A, 03/29/2017); Colonoscopy with propofol (N/A, 10/23/2019); and Esophagogastroduodenoscopy (egd) with propofol (N/A, 10/23/2019). Family: family history includes Arthritis in his father, maternal grandfather, maternal grandmother, mother, paternal grandfather, and paternal grandmother; Asthma in his mother; Coronary artery disease in his father and mother; Heart disease in his father, maternal grandfather, maternal grandmother, paternal grandfather, and paternal grandmother; Hypertension in his maternal grandfather, maternal grandmother, mother, paternal grandfather, and paternal grandmother; Mental illness in his mother.  Laboratory Chemistry Profile   Renal Lab Results  Component Value Date   BUN 13 04/21/2017   CREATININE 0.93 04/21/2017   LABCREA 79.3 03/05/2014   GFR 82.19 08/05/2014   GFRAA >60 04/21/2017   GFRNONAA >60 04/21/2017     Hepatic Lab Results  Component Value Date   AST 30 04/21/2017   ALT 23 04/21/2017   ALBUMIN 4.2 04/21/2017   ALKPHOS 86 04/21/2017   LIPASE 51 04/21/2017     Electrolytes Lab Results  Component Value Date   NA 138 04/21/2017   K 3.7 04/21/2017   CL 101 04/21/2017   CALCIUM 9.4 04/21/2017     Bone Lab Results  Component Value Date   VD25OH 31.95 03/05/2016     Inflammation (CRP:  Acute Phase) (ESR: Chronic Phase) Lab Results  Component Value Date   CRP 0.3 (L) 04/29/2017   ESRSEDRATE 2 04/29/2017       Note: Above Lab results reviewed.  Recent Imaging Review  NM GASTRIC EMPTYING CLINICAL DATA:  Epigastric pain  EXAM: NUCLEAR MEDICINE GASTRIC EMPTYING SCAN  TECHNIQUE: After oral ingestion of radiolabeled meal, sequential abdominal images were obtained for 4 hours. Percentage of activity emptying the stomach was calculated at 1 hour, 2 hour, 3 hour, and 4 hours.  RADIOPHARMACEUTICALS:  2.57 mCi Tc-68msulfur colloid in standardized meal  COMPARISON:  Ultrasound 04/21/2017  FINDINGS: Expected location of the stomach in the left upper quadrant. Ingested meal empties the stomach gradually over the course of the study.  49% emptied at 1 hr ( normal >= 10%)  67% emptied at 2 hr ( normal >= 40%)  81% emptied at 3 hr ( normal >= 70%)  100% emptied at 4 hr ( normal >= 90%)  IMPRESSION: Normal gastric emptying study.  Electronically Signed   By: KDonavan FoilM.D.   On: 08/24/2019 16:55 Note: Reviewed        INDICATION:  Fusion of spine, cervical region. Neck pain that radiates to the back of the head.   COMPARISON: MRI thoracic spine performed on 05/24/2017, radiographs dated 05/17/2017 and MRI cervical spine dated 05/13/2017.   TECHNIQUE: Multiplanar, multisequence MR imaging obtained through the cervical spine without intravenous administration of contrast on 01/25/2020 1:04 PM.   CONTRAST: None.   FINDINGS:  # Osseous structures: Status post ACDF from C3 to C7. Osseous fusion at C3-C4 and C6-C7; intervertebral disc spacers appreciated at C4-C5 and C5-C6. There is a large hemangioma in the T2 vertebral body. The visualized vertebral body heights are well-maintained. No evidence of a fracture.  # Alignment: Alignment maintained.  # Cervicomedullary junction: Unremarkable. No cerebellar tonsillar ectopia.  # Spinal cord: The spinal cord is  small in caliber with increased T2-weighted signal intensity at the C4-C5 level, consistent with myelomalacia. This is unchanged.   # C2-C3: Mild central disc protrusion without any significant spinal canal or neuroforaminal compromise. This is unchanged.  # C3-C4: Status post ACDF with osseous bridging across the disc space. No significant spinal canal or neuroforaminal compromise. This is unchanged.  # C4-C5: Status post ACDF with interbody spacer with osseous bridging across the disc space. There is a posterior LEFT paracentral osteophyte. Moderate spinal canal narrowing. Foramen are patent. This is unchanged.  # C5-C6: Status post ACDF with interbody spacer an osseous bridging across the disc space. Spinal canal and foramen are patent. This is unchanged.  # C6-C7: Status post ACDF with osseous bridging across the disc space. Mild RIGHT neuroforaminal stenosis. The LEFT neuroforamen is patent. Spinal canal is patent. This is unchanged.  # C7-T1: Small posterior disc osteophytic complex and facet arthrosis resulting in mild-to-moderate bilateral neuroforaminal stenosis. This is unchanged.   # Paraspinal tissues: Unremarkable   # Additional comments: None. Procedure Note  Bryon Lions, MD - 01/26/2020  Formatting of this note might be different from the original.  INDICATION: Fusion of spine, cervical region. Neck pain that radiates to the back of the head.   COMPARISON: MRI thoracic spine performed on 05/24/2017, radiographs dated 05/17/2017 and MRI cervical spine dated 05/13/2017.   TECHNIQUE: Multiplanar, multisequence MR imaging obtained through the cervical spine without intravenous administration of contrast on 01/25/2020 1:04 PM.   CONTRAST: None.   FINDINGS:  # Osseous structures: Status post ACDF from C3 to C7. Osseous fusion at C3-C4 and C6-C7; intervertebral disc spacers appreciated at C4-C5 and C5-C6. There is a large hemangioma in the T2 vertebral body. The visualized  vertebral body heights are well-maintained. No evidence of a fracture.  # Alignment: Alignment maintained.  # Cervicomedullary junction: Unremarkable. No cerebellar tonsillar ectopia.  # Spinal cord: The spinal cord is small in caliber with increased T2-weighted signal intensity at theC4-C5 level, consistent with myelomalacia. This is unchanged.   # C2-C3: Mild central disc protrusion without any significant spinal canal or neuroforaminal compromise. This is unchanged.  # C3-C4: Status post ACDF with osseous bridging across the disc space. No significant spinal canal or neuroforaminal compromise. This is unchanged.  # C4-C5: Status post ACDF with interbody spacer with osseous bridging across the disc space. There is a posterior LEFT paracentral osteophyte. Moderate spinal canal narrowing. Foramen are patent. This is unchanged.  # C5-C6: Status post ACDF with interbody spacer an osseous bridging across the disc space. Spinal canal and foramen are patent. This is unchanged.  # C6-C7: Status post ACDF with osseous bridging across the disc space. Mild RIGHT neuroforaminal stenosis. The LEFT neuroforamen is  patent. Spinal canal is patent. This is unchanged.  # C7-T1: Small posterior disc osteophytic complex and facet arthrosis resulting in mild-to-moderate bilateral neuroforaminal stenosis. This is unchanged.   # Paraspinal tissues: Unremarkable   # Additional comments: None.    IMPRESSION:  1. No significant interval change.  2. Status post ACDF from C3 to C7 with myelomalacia at C4-C5.  3. Persistent degenerative disc disease and facet arthrosis at C7-T1 resulting in mild to moderate bilateral neuroforaminal stenosis.    Physical Exam  General appearance: Well nourished, well developed, and well hydrated. In no apparent acute distress Mental status: Alert, oriented x 3 (person, place, & time)       Respiratory: No evidence of acute respiratory distress Eyes: PERLA Vitals: BP  115/85   Pulse 63   Temp (!) 96.4 F (35.8 C) (Temporal)   Resp 16   Ht 6' 2"  (1.88 m)   Wt (!) 325 lb (147.4 kg)   SpO2 98%   BMI 41.73 kg/m  BMI: Estimated body mass index is 41.73 kg/m as calculated from the following:   Height as of this encounter: 6' 2"  (1.88 m).   Weight as of this encounter: 325 lb (147.4 kg). Ideal: Ideal body weight: 82.2 kg (181 lb 3.5 oz) Adjusted ideal body weight: 108.3 kg (238 lb 11.7 oz)  Cervical Spine Area Exam  Skin & Axial Inspection:Well healed scar from previous spine surgery detected Alignment:Symmetrical Functional SPQ:ZRAQ restricted ROM, bilaterally Stability:No instability detected Muscle Tone/Strength:Functionally intact. No obvious neuro-muscular anomalies detected. Sensory (Neurological):Dermatomal pain patternand neurogenic Palpation:No palpable anomalies  Upper Extremity (UE) Exam    Side:Right upper extremity  Side:Left upper extremity  Skin & Extremity Inspection:Skin color, temperature, and hair growth are WNL. No peripheral edema or cyanosis. No masses, redness, swelling, asymmetry, or associated skin lesions. No contractures.  Skin & Extremity Inspection:Skin color, temperature, and hair growth are WNL. No peripheral edema or cyanosis. No masses, redness, swelling, asymmetry, or associated skin lesions. No contractures.  Functional TMA:UQJF restricted ROMfor shoulder and elbow  Functional HLK:TGYB restricted ROMfor shoulder and elbow  Muscle Tone/Strength:Functionally intact. No obvious neuro-muscular anomalies detected.  Muscle Tone/Strength:Functionally intact. No obvious neuro-muscular anomalies detected.  Sensory (Neurological):Unimpaired  Sensory (Neurological):Unimpaired  Palpation:No palpable anomalies  Palpation:No palpable anomalies  Provocative Test(s): Phalen's test:deferred Tinel's test:deferred Apley's scratch test (touch opposite  shoulder): Action 1 (Across chest):Decreased ROM Action 2 (Overhead):Decreased ROM Action 3 (LB reach):Decreased ROM    Provocative Test(s): Phalen's test:deferred Tinel's test:deferred Apley's scratch test (touch opposite shoulder): Action 1 (Across chest):Decreased ROM Action 2 (Overhead):Decreased ROM Action 3 (LB reach):Decreased ROM   5 out of 5 strength bilateral upper extremity: Shoulder abduction, elbow flexion, elbow extension, thumb extension.   Patient presents today in wheelchair.  Difficulty ambulating.  Pain with lumbar extension   Assessment   Status Diagnosis  Persistent Persistent Persistent 1. Spondylosis, cervical, with myelopathy   2. Myelopathy of cervical spinal cord with cervical radiculopathy (HCC)   3. Thoracic radiculopathy   4. H/O cervical spinal arthrodesis (C3-C7 fusion)   5. Cervical facet joint syndrome   6. Thoracic facet joint syndrome   7. Failed cervical fusion   8. Chronic pain syndrome        Plan of Care  Mr. TYSON PARKISON has a current medication list which includes the following long-term medication(s): carbidopa-levodopa, carbidopa-levodopa, clonazepam, [START ON 02/05/2020] hydrocodone-acetaminophen, [START ON 03/06/2020] hydrocodone-acetaminophen, [START ON 04/05/2020] hydrocodone-acetaminophen, nortriptyline, pregabalin, propranolol, sucralfate, valsartan, and losartan-hydrochlorothiazide.  Pharmacotherapy (Medications Ordered): Meds  ordered this encounter  Medications  . HYDROcodone Bitartrate ER 10 MG CP12    Sig: Take 10 mg by mouth every 12 (twelve) hours. Must last 30 days.    Dispense:  60 capsule    Refill:  0    Chronic Pain. (STOP Act - Not applicable). Fill one day early if closed on scheduled refill date.  Marland Kitchen HYDROcodone-acetaminophen (NORCO) 10-325 MG tablet    Sig: Take 1 tablet by mouth 3 (three) times daily as needed. For chronic pain syndrome. Each Rx to last 30 days.    Dispense:  90 tablet     Refill:  0  . HYDROcodone Bitartrate ER 10 MG CP12    Sig: Take 10 mg by mouth every 12 (twelve) hours. Must last 30 days.    Dispense:  60 capsule    Refill:  0    Chronic Pain. (STOP Act - Not applicable). Fill one day early if closed on scheduled refill date.  Marland Kitchen HYDROcodone Bitartrate ER 10 MG CP12    Sig: Take 10 mg by mouth every 12 (twelve) hours. Must last 30 days.    Dispense:  60 capsule    Refill:  0    Chronic Pain. (STOP Act - Not applicable). Fill one day early if closed on scheduled refill date.  Marland Kitchen HYDROcodone-acetaminophen (NORCO) 10-325 MG tablet    Sig: Take 1 tablet by mouth 3 (three) times daily as needed. For chronic pain syndrome. Each Rx to last 30 days.    Dispense:  90 tablet    Refill:  0  . HYDROcodone-acetaminophen (NORCO) 10-325 MG tablet    Sig: Take 1 tablet by mouth 3 (three) times daily as needed. For chronic pain syndrome. Each Rx to last 30 days.    Dispense:  90 tablet    Refill:  0  . nortriptyline (PAMELOR) 25 MG capsule    Sig: Take 2 capsules (50 mg total) by mouth at bedtime.    Dispense:  30 capsule    Refill:  5  . tiZANidine (ZANAFLEX) 4 MG tablet    Sig: Take 1 tablet (4 mg total) by mouth every 8 (eight) hours as needed for muscle spasms.    Dispense:  90 tablet    Refill:  2  . butalbital-acetaminophen-caffeine (FIORICET) 50-325-40 MG tablet    Sig: Take 1 tablet by mouth 2 (two) times daily as needed for headache or migraine.    Dispense:  30 tablet    Refill:  1   Follow-up plan:   Return in about 3 months (around 04/27/2020) for Medication Management, in person.     Diagnostic cervical facet medial branch nerve blocks C3-C7 b/l 05/06/19- did not help Diagnostic thoracic facet medial branch nerve blocks T1-T2, consider bilateral occipital nerve block  Cervical/trapezius trigger point injections SPG block               Recent Visits Date Type Provider Dept  12/01/19 Office Visit Thomas Santa, MD [facility]  Showing  recent visits within past 90 days and meeting all other requirements Today's Visits Date Type Provider Dept  01/26/20 Office Visit Thomas Santa, MD [facility]  Showing today's visits and meeting all other requirements Future Appointments No visits were found meeting these conditions. Showing future appointments within next 90 days and meeting all other requirements  I discussed the assessment and treatment plan with the patient. The patient was provided an opportunity to ask questions and all were answered. The patient  agreed with the plan and demonstrated an understanding of the instructions.  Patient advised to call back or seek an in-person evaluation if the symptoms or condition worsens.  Duration of encounter: 30 minutes.  Note by: Thomas Santa, MD Date: 01/26/2020; Time: 1:32 PM

## 2020-01-26 NOTE — Addendum Note (Signed)
Addended by: Gillis Santa on: 01/26/2020 01:53 PM   Modules accepted: Orders

## 2020-01-26 NOTE — Progress Notes (Signed)
Nursing Pain Medication Assessment:  Safety precautions to be maintained throughout the outpatient stay will include: orient to surroundings, keep bed in low position, maintain call bell within reach at all times, provide assistance with transfer out of bed and ambulation.  Medication Inspection Compliance: Pill count conducted under aseptic conditions, in front of the patient. Neither the pills nor the bottle was removed from the patient's sight at any time. Once count was completed pills were immediately returned to the patient in their original bottle.  Medication #1: Hydrocodone/APAP Pill/Patch Count: 90 of 90 pills remain Pill/Patch Appearance: Markings consistent with prescribed medication Bottle Appearance: Standard pharmacy container. Clearly labeled. Filled Date: 01/06/2020 Last Medication intake:  Today  Medication #2: Hydrococone Bitartrate Pill/Patch Count: 49 of 60 pills remain Pill/Patch Appearance: Markings consistent with prescribed medication Bottle Appearance: Standard pharmacy container. Clearly labeled. Filled Date: 01/14/2020 Last Medication intake:  Today

## 2020-02-01 DIAGNOSIS — E119 Type 2 diabetes mellitus without complications: Secondary | ICD-10-CM

## 2020-02-01 HISTORY — DX: Type 2 diabetes mellitus without complications: E11.9

## 2020-03-14 ENCOUNTER — Encounter: Payer: Self-pay | Admitting: Student in an Organized Health Care Education/Training Program

## 2020-04-26 ENCOUNTER — Ambulatory Visit
Payer: 59 | Attending: Student in an Organized Health Care Education/Training Program | Admitting: Student in an Organized Health Care Education/Training Program

## 2020-04-26 ENCOUNTER — Other Ambulatory Visit: Payer: Self-pay

## 2020-04-26 ENCOUNTER — Encounter: Payer: Self-pay | Admitting: Student in an Organized Health Care Education/Training Program

## 2020-04-26 VITALS — BP 125/87 | HR 94 | Temp 97.2°F | Resp 18 | Ht 74.0 in | Wt 330.0 lb

## 2020-04-26 DIAGNOSIS — M47812 Spondylosis without myelopathy or radiculopathy, cervical region: Secondary | ICD-10-CM | POA: Diagnosis present

## 2020-04-26 DIAGNOSIS — M4712 Other spondylosis with myelopathy, cervical region: Secondary | ICD-10-CM | POA: Diagnosis not present

## 2020-04-26 DIAGNOSIS — R1032 Left lower quadrant pain: Secondary | ICD-10-CM | POA: Insufficient documentation

## 2020-04-26 DIAGNOSIS — G959 Disease of spinal cord, unspecified: Secondary | ICD-10-CM | POA: Insufficient documentation

## 2020-04-26 DIAGNOSIS — M5414 Radiculopathy, thoracic region: Secondary | ICD-10-CM

## 2020-04-26 DIAGNOSIS — Z981 Arthrodesis status: Secondary | ICD-10-CM | POA: Diagnosis present

## 2020-04-26 DIAGNOSIS — M960 Pseudarthrosis after fusion or arthrodesis: Secondary | ICD-10-CM | POA: Diagnosis present

## 2020-04-26 DIAGNOSIS — M47894 Other spondylosis, thoracic region: Secondary | ICD-10-CM

## 2020-04-26 DIAGNOSIS — M791 Myalgia, unspecified site: Secondary | ICD-10-CM | POA: Insufficient documentation

## 2020-04-26 DIAGNOSIS — M5412 Radiculopathy, cervical region: Secondary | ICD-10-CM | POA: Insufficient documentation

## 2020-04-26 DIAGNOSIS — G894 Chronic pain syndrome: Secondary | ICD-10-CM | POA: Diagnosis present

## 2020-04-26 MED ORDER — HYDROCODONE-ACETAMINOPHEN 10-325 MG PO TABS: 1.0000 | ORAL_TABLET | Freq: Three times a day (TID) | ORAL | 0 refills | Status: DC | PRN

## 2020-04-26 MED ORDER — HYDROCODONE-ACETAMINOPHEN 10-325 MG PO TABS
1.0000 | ORAL_TABLET | Freq: Three times a day (TID) | ORAL | 0 refills | Status: DC | PRN
Start: 2020-05-09 — End: 2020-07-19

## 2020-04-26 MED ORDER — HYDROCODONE BITARTRATE ER 10 MG PO CP12: 10.0000 mg | ORAL_CAPSULE | Freq: Two times a day (BID) | ORAL | 0 refills | Status: AC

## 2020-04-26 MED ORDER — HYDROCODONE BITARTRATE ER 10 MG PO CP12: 10.0000 mg | ORAL_CAPSULE | Freq: Two times a day (BID) | ORAL | 0 refills | Status: DC

## 2020-04-26 MED ORDER — HYDROCODONE BITARTRATE ER 10 MG PO CP12
10.0000 mg | ORAL_CAPSULE | Freq: Two times a day (BID) | ORAL | 0 refills | Status: DC
Start: 2020-05-13 — End: 2020-04-26

## 2020-04-26 MED ORDER — HYDROCODONE-ACETAMINOPHEN 10-325 MG PO TABS
1.0000 | ORAL_TABLET | Freq: Three times a day (TID) | ORAL | 0 refills | Status: DC | PRN
Start: 2020-05-09 — End: 2020-04-26

## 2020-04-26 NOTE — Patient Instructions (Addendum)
You will have labs completed prior to next appt.  You have been given 3 Rx for Hydrocodone-Barbiturate to last until 08/11/2020 and 3Rx for Hydrocodone/APAP to last until 08/07/2020.

## 2020-04-26 NOTE — Progress Notes (Signed)
Nursing Pain Medication Assessment:  Safety precautions to be maintained throughout the outpatient stay will include: orient to surroundings, keep bed in low position, maintain call bell within reach at all times, provide assistance with transfer out of bed and ambulation.  Medication Inspection Compliance: Pill count conducted under aseptic conditions, in front of the patient. Neither the pills nor the bottle was removed from the patient's sight at any time. Once count was completed pills were immediately returned to the patient in their original bottle.  Medication #1: Hydrocodone/APAP Pill/Patch Count: 70 of 90 pills remain Pill/Patch Appearance: Markings consistent with prescribed medication Bottle Appearance: Standard pharmacy container. Clearly labeled. Filled Date: 78 / 75 / 21 Last Medication intake:  Today  Medication #2: Hydrocodone/APAP Pill/Patch Count: 90 of 90 pills remain Pill/Patch Appearance: Markings consistent with prescribed medication Bottle Appearance: Standard pharmacy container. Clearly labeled. Filled Date: 1 / 8 / 22 Last Medication intake:  has not started yet

## 2020-04-26 NOTE — Progress Notes (Addendum)
PROVIDER NOTE: Information contained herein reflects review and annotations entered in association with encounter. Interpretation of such information and data should be left to medically-trained personnel. Information provided to patient can be located elsewhere in the medical record under "Patient Instructions". Document created using STT-dictation technology, any transcriptional errors that may result from process are unintentional.    Patient: Thomas Cole  Service Category: E/M  Provider: Gillis Santa, MD  DOB: [birth year]  DOS: 04/26/2020  Specialty: Interventional Pain Management  MRN: [id number]  Setting: Ambulatory outpatient  PCP: Derinda Late, MD  Type: Established Patient    Referring Provider: Derinda Late, MD  Location: Office  Delivery: Face-to-face     HPI  Thomas Cole, a 50 y.o. year old male, is here today because of his Spondylosis, cervical, with myelopathy [M47.12]. Thomas Cole primary complain today is Neck Pain, Shoulder Pain (bilat), and Joint Pain (throughout) Last encounter: My last encounter with him was on 01/26/2020. Pertinent problems: Thomas Cole has Spondylosis, cervical, with myelopathy; Migraines; History of seizures; Generalized anxiety disorder; and Bilateral occipital neuralgia on their pertinent problem list. Pain Assessment: Severity of Chronic pain is reported as a 8 /10. Location: Neck  /denies. Onset: More than a month ago. Quality: Sharp,Aching,Burning,Shooting. Timing: Constant. Modifying factor(s): meds help some,. Vitals:  height is 6' 2"  (1.88 m) and weight is 330 lb (149.7 kg) (abnormal). His temporal temperature is 97.2 F (36.2 C) (abnormal). His blood pressure is 125/87 and his pulse is 94. His respiration is 18 and oxygen saturation is 98%.   Reason for encounter: medication management.    Glendell Docker presents today for medication management.  No significant change in his medical history since his last visit with me.  Patient has a history of C3  to C7 fusion with associated cervical myelopathy and myelomalacia.  He has persistent neck pain.  He is complaining of increased polyarthralgias and polymyalgias.  He is hoping to get his blood sugars better controlled.  Fortunately, he has not had any falls since his last clinic visit.  He is endorsing increased left lower quadrant pain and feels that he has a gastric ulcer.  He has been evaluated by GI in the past and has had an extensive work-up with colonoscopy and endoscopy.  He states that he was not very happy with his follow-up care there nor did they respond to his questions or requests.  He is requesting referral to another GI specialist.  Pharmacotherapy Assessment   04/13/2020  01/26/2020   2  Hydrocodone Er 10 Mg Capsule  60.00  30  Bi Lat  [id number]  Gib (4800)  0/0  20.00 MME  Comm Ins  [place]    04/09/2020  01/26/2020   2  Hydrocodone-Acetamin 10-325 Mg  90.00  30  Bi Lat  [id number]  Gib (4800)  0/0  30.00 MME  Comm Ins  [place]      Analgesic:   Monitoring: [place] PMP: PDMP reviewed during this encounter.       Pharmacotherapy: No side-effects or adverse reactions reported. Compliance: No problems identified. Effectiveness: Clinically acceptable.  Rise Patience, RN  04/26/2020  2:56 PM  Signed Nursing Pain Medication Assessment:  Safety precautions to be maintained throughout the outpatient stay will include: orient to surroundings, keep bed in low position, maintain call bell within reach at all times, provide assistance with transfer out of bed and ambulation.  Medication Inspection Compliance: Pill count conducted under aseptic conditions, in front of the patient. Neither the pills  nor the bottle was removed from the patient's sight at any time. Once count was completed pills were immediately returned to the patient in their original bottle.  Medication #1: Hydrocodone/APAP Pill/Patch Count: 70 of 90 pills remain Pill/Patch Appearance: Markings consistent with prescribed  medication Bottle Appearance: Standard pharmacy container. Clearly labeled. Filled Date: 18 / 30 / 21 Last Medication intake:  Today  Medication #2: Hydrocodone/APAP Pill/Patch Count: 90 of 90 pills remain Pill/Patch Appearance: Markings consistent with prescribed medication Bottle Appearance: Standard pharmacy container. Clearly labeled. Filled Date: 1 / 8 / 22 Last Medication intake:  has not started yet  Rise Patience, RN  04/26/2020  2:56 PM  Signed Nursing Pain Medication Assessment:  Safety precautions to be maintained throughout the outpatient stay will include: orient to surroundings, keep bed in low position, maintain call bell within reach at all times, provide assistance with transfer out of bed and ambulation.  Medication Inspection Compliance: Pill count conducted under aseptic conditions, in front of the patient. Neither the pills nor the bottle was removed from the patient's sight at any time. Once count was completed pills were immediately returned to the patient in their original bottle.  Medication: Hydrocodone barbiturate Pill/Patch Count: 50 of 60 pills remain Pill/Patch Appearance: Markings consistent with prescribed medication Bottle Appearance: Standard pharmacy container. Clearly labeled. Filled Date: 1 / 110 / 22 Last Medication intake:  Today    UDS:  Summary  Date Value Ref Range Status  10/27/2019 Note  Final    Comment:    ==================================================================== [facility] 13 (MW) ==================================================================== Test                             Result       Flag       Units  Drug Present and Declared for Prescription Verification   7-aminoclonazepam              103          EXPECTED   ng/mg creat    7-aminoclonazepam is an expected metabolite of clonazepam. Source of    clonazepam is a scheduled prescription medication.    Hydrocodone                    413          EXPECTED    ng/mg creat   Hydromorphone                  122          EXPECTED   ng/mg creat   Norhydrocodone                 2478         EXPECTED   ng/mg creat    Sources of hydrocodone include scheduled prescription medications.    Hydromorphone and norhydrocodone are expected metabolites of    hydrocodone. Hydromorphone is also available as a scheduled    prescription medication.  Drug Absent but Declared for Prescription Verification   Butalbital                     Not Detected UNEXPECTED ==================================================================== Test                      Result    Flag   Units      Ref Range   Creatinine  120              mg/dL      >=20 ==================================================================== Declared Medications:  The flagging and interpretation on this report are based on the  following declared medications.  Unexpected results may arise from  inaccuracies in the declared medications.   **Note: The testing scope of this panel includes these medications:   Butalbital (Fioricet)  Clonazepam (Klonopin)  Hydrocodone   **Note: The testing scope of this panel does not include the  following reported medications:   Acetaminophen (Fioricet)  Acetaminophen  Caffeine (Fioricet)  Carbidopa (Sinemet)  Cholecalciferol  Cyanocobalamin  Duloxetine (Cymbalta)  Hydrochlorothiazide  Levodopa (Sinemet)  Losartan (Cozaar)  Nortriptyline (Pamelor)  Omeprazole (Prilosec)  Ondansetron (Zofran)  Polyethylene Glycol (MiraLAX)  Pregabalin (Lyrica)  Propranolol (Inderal)  Sucralfate (Carafate)  Tizanidine (Zanaflex)  Valsartan (Diovan) ==================================================================== For clinical consultation, please call [phone]. ====================================================================      ROS  Constitutional: Denies any fever or chills Gastrointestinal: No reported hemesis, hematochezia, vomiting,  or acute GI distress Musculoskeletal: Neck pain, polyarthralgias, polymyalgias Neurological: Bilateral upper extremity numbness and tingling  Medication Review  DULoxetine, HYDROcodone Bitartrate ER, HYDROcodone-acetaminophen, Vitamin D3, butalbital-acetaminophen-caffeine, carbidopa-levodopa, clonazePAM, entacapone, glipizide-metformin, hydrochlorothiazide, losartan, metFORMIN, nortriptyline, ondansetron, pregabalin, propranolol, sucralfate, tiZANidine, and vitamin B-12  History Review  Allergy: Thomas Cole is allergic to amitriptyline, bee venom, chlorhexidine, carbamazepine, meloxicam, morphine and related, and sulfa antibiotics. Drug: Thomas Cole  reports no history of drug use. Alcohol:  reports no history of alcohol use. Tobacco:  reports that he has never smoked. He has never used smokeless tobacco. Social: Thomas Cole  reports that he has never smoked. He has never used smokeless tobacco. He reports that he does not drink alcohol and does not use drugs. Medical:  has a past medical history of Anxiety, Asthma, Back problem, Benign essential hypertension (11/30/2016), Cervical spondylosis without myelopathy (1/51/7616), Complication of anesthesia, Depression, Diabetes mellitus without complication ([place]) (09/3708), GERD (gastroesophageal reflux disease), Hypertension, Labile hypertension (03/19/2013), Major depressive disorder, recurrent episode, severe ([place]) (06/23/2015), Migraine headache, Migraines (11/11/2013), S/P appendectomy (04/09/2011), Seizure ([place]) (11/30/2016), Seizures ([place]), Shortness of breath, and SOB (shortness of breath) (08/06/2014). Surgical: Thomas Cole  has a past surgical history that includes Knee surgery; Posterior fusion cervical spine; Cervical disc surgery; Appendectomy (12); Anterior cervical corpectomy (N/A, 09/15/2012); Cholecystectomy (N/A, 03/29/2017); Colonoscopy with propofol (N/A, 10/23/2019); and Esophagogastroduodenoscopy (egd) with propofol (N/A, 10/23/2019). Family: family  history includes Arthritis in his father, maternal grandfather, maternal grandmother, mother, paternal grandfather, and paternal grandmother; Asthma in his mother; Coronary artery disease in his father and mother; Heart disease in his father, maternal grandfather, maternal grandmother, paternal grandfather, and paternal grandmother; Hypertension in his maternal grandfather, maternal grandmother, mother, paternal grandfather, and paternal grandmother; Mental illness in his mother.  Laboratory Chemistry Profile   Renal Lab Results  Component Value Date   BUN 13 04/21/2017   CREATININE 0.93 04/21/2017   LABCREA 79.3 03/05/2014   GFR 82.19 08/05/2014   GFRAA >60 04/21/2017   GFRNONAA >60 04/21/2017     Hepatic Lab Results  Component Value Date   AST 30 04/21/2017   ALT 23 04/21/2017   ALBUMIN 4.2 04/21/2017   ALKPHOS 86 04/21/2017   LIPASE 51 04/21/2017     Electrolytes Lab Results  Component Value Date   NA 138 04/21/2017   K 3.7 04/21/2017   CL 101 04/21/2017   CALCIUM 9.4 04/21/2017     Bone Lab Results  Component Value Date   VD25OH 31.95 03/05/2016  Inflammation (CRP: Acute Phase) (ESR: Chronic Phase) Lab Results  Component Value Date   CRP 0.3 (L) 04/29/2017   ESRSEDRATE 2 04/29/2017       Note: Above Lab results reviewed.  Recent Imaging Review  NM GASTRIC EMPTYING CLINICAL DATA:  Epigastric pain  EXAM: NUCLEAR MEDICINE GASTRIC EMPTYING SCAN  TECHNIQUE: After oral ingestion of radiolabeled meal, sequential abdominal images were obtained for 4 hours. Percentage of activity emptying the stomach was calculated at 1 hour, 2 hour, 3 hour, and 4 hours.  RADIOPHARMACEUTICALS:  2.57 mCi Tc-67msulfur colloid in standardized meal  COMPARISON:  Ultrasound 04/21/2017  FINDINGS: Expected location of the stomach in the left upper quadrant. Ingested meal empties the stomach gradually over the course of the study.  49% emptied at 1 hr ( normal >=  10%)  67% emptied at 2 hr ( normal >= 40%)  81% emptied at 3 hr ( normal >= 70%)  100% emptied at 4 hr ( normal >= 90%)  IMPRESSION: Normal gastric emptying study.  Electronically Signed   By: KDonavan FoilM.D.   On: 08/24/2019 16:55 Note: Reviewed        Physical Exam  General appearance: Well nourished, well developed, and well hydrated. In no apparent acute distress Mental status: Alert, oriented x 3 (person, place, & time)       Respiratory: No evidence of acute respiratory distress Eyes: PERLA Vitals: BP 125/87   Pulse 94   Temp (!) 97.2 F (36.2 C) (Temporal)   Resp 18   Ht 6' 2"  (1.88 m)   Wt (!) 330 lb (149.7 kg)   SpO2 98%   BMI 42.37 kg/m  BMI: Estimated body mass index is 42.37 kg/m as calculated from the following:   Height as of this encounter: 6' 2"  (1.88 m).   Weight as of this encounter: 330 lb (149.7 kg). Ideal: Ideal body weight: 82.2 kg (181 lb 3.5 oz) Adjusted ideal body weight: 109.2 kg (240 lb 11.7 oz)  Cervical Spine Area Exam  Skin & Axial Inspection:Well healed scar from previous spine surgery detected Alignment:Symmetrical Functional RXYI:AXKPrestricted ROM, bilaterally Stability:No instability detected Muscle Tone/Strength:Functionally intact. No obvious neuro-muscular anomalies detected. Sensory (Neurological):Dermatomal pain patternand neurogenic Palpation:No palpable anomalies  Upper Extremity (UE) Exam    Side:Right upper extremity  Side:Left upper extremity  Skin & Extremity Inspection:Skin color, temperature, and hair growth are WNL. No peripheral edema or cyanosis. No masses, redness, swelling, asymmetry, or associated skin lesions. No contractures.  Skin & Extremity Inspection:Skin color, temperature, and hair growth are WNL. No peripheral edema or cyanosis. No masses, redness, swelling, asymmetry, or associated skin lesions. No contractures.  Functional RVVZ:SMOLrestricted ROMfor shoulder and elbow   Functional RMBE:MLJQrestricted ROMfor shoulder and elbow  Muscle Tone/Strength:Functionally intact. No obvious neuro-muscular anomalies detected.  Muscle Tone/Strength:Functionally intact. No obvious neuro-muscular anomalies detected.  Sensory (Neurological):Unimpaired  Sensory (Neurological):Unimpaired  Palpation:No palpable anomalies  Palpation:No palpable anomalies  Provocative Test(s): Phalen's test:deferred Tinel's test:deferred Apley's scratch test (touch opposite shoulder): Action 1 (Across chest):Decreased ROM Action 2 (Overhead):Decreased ROM Action 3 (LB reach):Decreased ROM    Provocative Test(s): Phalen's test:deferred Tinel's test:deferred Apley's scratch test (touch opposite shoulder): Action 1 (Across chest):Decreased ROM Action 2 (Overhead):Decreased ROM Action 3 (LB reach):Decreased ROM   5 out of 5 strength bilateral upper extremity: Shoulder abduction, elbow flexion, elbow extension, thumb extension.   Patient presents today in wheelchair.      Assessment   Status Diagnosis  Controlled Controlled Controlled 1.  Spondylosis, cervical, with myelopathy   2. Myelopathy of cervical spinal cord with cervical radiculopathy (HCC)   3. Thoracic radiculopathy   4. H/O cervical spinal arthrodesis (C3-C7 fusion)   5. Cervical facet joint syndrome   6. Thoracic facet joint syndrome   7. Failed cervical fusion   8. Abdominal pain, left lower quadrant   9. Chronic pain syndrome   10. Myalgia        Plan of Care  Problem-specific:  No problem-specific Assessment & Plan notes found for this encounter.  Thomas Cole has a current medication list which includes the following long-term medication(s): carbidopa-levodopa, carbidopa-levodopa, clonazepam, glipizide-metformin, metformin, nortriptyline, pregabalin, propranolol, sucralfate, [START ON 05/09/2020] hydrocodone-acetaminophen, [START ON  07/08/2020] hydrocodone-acetaminophen, and [START ON 06/08/2020] hydrocodone-acetaminophen.  Pharmacotherapy (Medications Ordered): Requested Prescriptions   Signed Prescriptions Disp Refills  . HYDROcodone Bitartrate ER 10 MG CP12 60 capsule 0    Sig: Take 10 mg by mouth every 12 (twelve) hours. Must last 30 days.  Marland Kitchen HYDROcodone Bitartrate ER 10 MG CP12 60 capsule 0    Sig: Take 10 mg by mouth every 12 (twelve) hours. Must last 30 days.  Marland Kitchen HYDROcodone Bitartrate ER 10 MG CP12 60 capsule 0    Sig: Take 10 mg by mouth every 12 (twelve) hours. Must last 30 days.  Marland Kitchen HYDROcodone-acetaminophen (NORCO) 10-325 MG tablet 90 tablet 0    Sig: Take 1 tablet by mouth 3 (three) times daily as needed. For chronic pain syndrome. Each Rx to last 30 days.  Marland Kitchen HYDROcodone-acetaminophen (NORCO) 10-325 MG tablet 90 tablet 0    Sig: Take 1 tablet by mouth 3 (three) times daily as needed. For chronic pain syndrome. Each Rx to last 30 days.  Marland Kitchen HYDROcodone-acetaminophen (NORCO) 10-325 MG tablet 90 tablet 0    Sig: Take 1 tablet by mouth 3 (three) times daily as needed. For chronic pain syndrome. Each Rx to last 30 days.   Continue Cymbalta as prescribed.  Limit Klonopin intake.  Avoid with hydrocodone.  Continue nortriptyline 50 mg nightly.  Continue Lyrica 100 mg 3 times a day.  Continue Zanaflex 4 mg twice daily as needed for muscle spasms. I will also obtain lab work as below for work-up of his new onset polyarthralgias, polymyalgias.  Review of his blood work from November showed slight hyponatremia which I do not think is the cause of his increased musculoskeletal pain.  Orders:  Orders Placed This Encounter  Procedures  . Rheumatoid factor    Order Specific Question:   CC Results    Answer:   [id number] [[id number]]    Order Specific Question:   Release to patient    Answer:   Immediate  . Magnesium    Indication: Pharmacologic therapy (Z79.899)    Order Specific Question:   CC Results    Answer:   PCP-NURSE  [[id number]]    Order Specific Question:   Release to patient    Answer:   Immediate  . Vitamin B12    Indication: Pharmacologic therapy (T90.300).    Order Specific Question:   CC Results    Answer:   [id number] [[id number]]    Order Specific Question:   Release to patient    Answer:   Immediate  . Sedimentation rate    Indication: Disorder of skeletal system (M89.9)    Order Specific Question:   CC Results    Answer:   PCP-NURSE [[id number]]    Order Specific Question:   Release to patient  Answer:   Immediate  . 25-Hydroxy vitamin D Lcms D2+D3    Indication: Disorder of skeletal system (M89.9).    Order Specific Question:   CC Results    Answer:   [id number] [[id number]]    Order Specific Question:   Release to patient    Answer:   Immediate  . C-reactive protein    Indication: Problems influencing health status (Z78.9)    Order Specific Question:   CC Results    Answer:   PCP-NURSE [[id number]]    Order Specific Question:   Release to patient    Answer:   Immediate  . Ambulatory referral to Gastroenterology    Referral Priority:   Routine    Referral Type:   Consultation    Referral Reason:   Specialty Services Required    Referred to Provider:   Lesly Rubenstein, MD    Number of Visits Requested:   1   Follow-up plan:   Return in about 3 months (around 07/25/2020) for Medication Management, in person.     Diagnostic cervical facet medial branch nerve blocks C3-C7 b/l 05/06/19- did not help Diagnostic thoracic facet medial branch nerve blocks T1-T2, consider bilateral occipital nerve block  Cervical/trapezius trigger point injections SPG block                Recent Visits No visits were found meeting these conditions. Showing recent visits within past 90 days and meeting all other requirements Today's Visits Date Type Provider Dept  04/26/20 Office Visit Gillis Santa, MD [facility]  Showing today's visits and meeting all other requirements Future Appointments Date Type  Provider Dept  07/19/20 Appointment Gillis Santa, MD [facility]  Showing future appointments within next 90 days and meeting all other requirements  I discussed the assessment and treatment plan with the patient. The patient was provided an opportunity to ask questions and all were answered. The patient agreed with the plan and demonstrated an understanding of the instructions.  Patient advised to call back or seek an in-person evaluation if the symptoms or condition worsens.  Duration of encounter30 minutes.  Note by: Gillis Santa, MD Date: 04/26/2020; Time: 2:56 PM

## 2020-04-26 NOTE — Progress Notes (Signed)
Nursing Pain Medication Assessment:  Safety precautions to be maintained throughout the outpatient stay will include: orient to surroundings, keep bed in low position, maintain call bell within reach at all times, provide assistance with transfer out of bed and ambulation.  Medication Inspection Compliance: Pill count conducted under aseptic conditions, in front of the patient. Neither the pills nor the bottle was removed from the patient's sight at any time. Once count was completed pills were immediately returned to the patient in their original bottle.  Medication: Hydrocodone barbiturate Pill/Patch Count: 50 of 60 pills remain Pill/Patch Appearance: Markings consistent with prescribed medication Bottle Appearance: Standard pharmacy container. Clearly labeled. Filled Date: 1 / 98 / 22 Last Medication intake:  Today

## 2020-05-02 LAB — 25-HYDROXY VITAMIN D LCMS D2+D3
25-Hydroxy, Vitamin D-2: <1 ng/mL
25-Hydroxy, Vitamin D-3: 35 ng/mL
25-Hydroxy, Vitamin D: 35 ng/mL

## 2020-05-02 LAB — VITAMIN B12: Vitamin B-12: 1453 pg/mL — ABNORMAL HIGH (ref 232–1245)

## 2020-05-02 LAB — SEDIMENTATION RATE: Sed Rate: 22 mm/hr (ref 0–30)

## 2020-05-02 LAB — C-REACTIVE PROTEIN: CRP: 10 mg/L (ref 0–10)

## 2020-05-02 LAB — MAGNESIUM: Magnesium: 1.9 mg/dL (ref 1.6–2.3)

## 2020-05-02 LAB — RHEUMATOID FACTOR: Rheumatoid fact SerPl-aCnc: <10 IU/mL (ref <14.0)

## 2020-05-03 ENCOUNTER — Encounter: Payer: Self-pay | Admitting: Student in an Organized Health Care Education/Training Program

## 2020-05-04 ENCOUNTER — Other Ambulatory Visit: Payer: Self-pay | Admitting: Student in an Organized Health Care Education/Training Program

## 2020-05-04 DIAGNOSIS — G894 Chronic pain syndrome: Secondary | ICD-10-CM

## 2020-05-09 ENCOUNTER — Encounter: Payer: Self-pay | Admitting: Student in an Organized Health Care Education/Training Program

## 2020-05-09 DIAGNOSIS — G894 Chronic pain syndrome: Secondary | ICD-10-CM

## 2020-05-09 MED ORDER — PREGABALIN 100 MG PO CAPS: 100.0000 mg | ORAL_CAPSULE | Freq: Three times a day (TID) | ORAL | 5 refills | Status: DC

## 2020-06-21 ENCOUNTER — Telehealth: Payer: Self-pay | Admitting: Adult Health Nurse Practitioner

## 2020-06-21 NOTE — Telephone Encounter (Signed)
Spoke with patient regarding the Palliative referral/services and all questions were answered and he was in agreement with scheduling visit.  I have scheduled an In-home Consult for 06/29/20 @ 8:30 AM.

## 2020-06-22 ENCOUNTER — Telehealth: Payer: Self-pay | Admitting: Adult Health Nurse Practitioner

## 2020-06-22 NOTE — Telephone Encounter (Signed)
Rec'd message that patient had called wanting to cancel the Palliative Consult.  Called patient back and he said that he was going to have to find another caregiver to be there with him for the Consult and he said that he would call me back to reschedule once he found someone else.  I asked patient if he had to had someone there with him for the Consult and he said he preferred that because he is very forgetful.  So I told him that I would cancel the appointment with NP and wait for him to call us back.  Notified Palliative Team.

## 2020-06-27 ENCOUNTER — Telehealth: Payer: Self-pay

## 2020-06-27 NOTE — Telephone Encounter (Signed)
Attempted to contact patient to reschedule Palliative care consult appointment. No answer left a message to return call.

## 2020-06-29 ENCOUNTER — Other Ambulatory Visit: Payer: Self-pay | Admitting: Adult Health Nurse Practitioner

## 2020-07-19 ENCOUNTER — Ambulatory Visit
Admission: RE | Admit: 2020-07-19 | Discharge: 2020-07-19 | Disposition: A | Discharge status: Home / Self Care | Payer: 59 | Source: Ambulatory Visit | Attending: Student in an Organized Health Care Education/Training Program | Admitting: Student in an Organized Health Care Education/Training Program

## 2020-07-19 ENCOUNTER — Encounter: Payer: Self-pay | Admitting: Student in an Organized Health Care Education/Training Program

## 2020-07-19 ENCOUNTER — Other Ambulatory Visit: Payer: Self-pay

## 2020-07-19 ENCOUNTER — Other Ambulatory Visit: Payer: Self-pay | Admitting: Student in an Organized Health Care Education/Training Program

## 2020-07-19 ENCOUNTER — Ambulatory Visit (HOSPITAL_BASED_OUTPATIENT_CLINIC_OR_DEPARTMENT_OTHER): Payer: 59 | Admitting: Student in an Organized Health Care Education/Training Program

## 2020-07-19 VITALS — BP 126/76 | HR 80 | Temp 96.7°F | Resp 14 | Ht 74.0 in | Wt 322.0 lb

## 2020-07-19 DIAGNOSIS — M47812 Spondylosis without myelopathy or radiculopathy, cervical region: Secondary | ICD-10-CM | POA: Diagnosis not present

## 2020-07-19 DIAGNOSIS — Z981 Arthrodesis status: Secondary | ICD-10-CM | POA: Insufficient documentation

## 2020-07-19 DIAGNOSIS — M47894 Other spondylosis, thoracic region: Secondary | ICD-10-CM | POA: Insufficient documentation

## 2020-07-19 DIAGNOSIS — M5412 Radiculopathy, cervical region: Secondary | ICD-10-CM | POA: Insufficient documentation

## 2020-07-19 DIAGNOSIS — M960 Pseudarthrosis after fusion or arthrodesis: Secondary | ICD-10-CM | POA: Insufficient documentation

## 2020-07-19 DIAGNOSIS — M4712 Other spondylosis with myelopathy, cervical region: Secondary | ICD-10-CM

## 2020-07-19 DIAGNOSIS — M5414 Radiculopathy, thoracic region: Secondary | ICD-10-CM | POA: Insufficient documentation

## 2020-07-19 DIAGNOSIS — M791 Myalgia, unspecified site: Secondary | ICD-10-CM

## 2020-07-19 DIAGNOSIS — M5481 Occipital neuralgia: Secondary | ICD-10-CM | POA: Insufficient documentation

## 2020-07-19 DIAGNOSIS — G894 Chronic pain syndrome: Secondary | ICD-10-CM

## 2020-07-19 DIAGNOSIS — R1032 Left lower quadrant pain: Secondary | ICD-10-CM

## 2020-07-19 DIAGNOSIS — G959 Disease of spinal cord, unspecified: Secondary | ICD-10-CM | POA: Insufficient documentation

## 2020-07-19 MED ORDER — HYDROCODONE-ACETAMINOPHEN 10-325 MG PO TABS: 1.0000 | ORAL_TABLET | Freq: Three times a day (TID) | ORAL | 0 refills | Status: DC | PRN

## 2020-07-19 MED ORDER — HYDROCODONE BITARTRATE ER 10 MG PO CP12: 10.0000 mg | ORAL_CAPSULE | Freq: Two times a day (BID) | ORAL | 0 refills | Status: AC

## 2020-07-19 MED ORDER — TIZANIDINE HCL 4 MG PO TABS
4.0000 mg | ORAL_TABLET | Freq: Three times a day (TID) | ORAL | 11 refills | Status: DC | PRN
Start: 2020-07-19 — End: 2020-10-26

## 2020-07-19 NOTE — Progress Notes (Signed)
PROVIDER NOTE: Information contained herein reflects review and annotations entered in association with encounter. Interpretation of such information and data should be left to medically-trained personnel. Information provided to patient can be located elsewhere in the medical record under "Patient Instructions". Document created using STT-dictation technology, any transcriptional errors that may result from process are unintentional.    Patient: Thomas Cole  Service Category: E/M  Provider: Gillis Santa, MD  DOB: Mar 27, 1971  DOS: 07/19/2020  Specialty: Interventional Pain Management  MRN: [id number]  Setting: Ambulatory outpatient  PCP: Thomas Late, MD  Type: Established Patient    Referring Provider: Derinda Late, MD  Location: Office  Delivery: Face-to-face     HPI  [name], a 50 y.o. year old male, is here today because of his Chronic pain syndrome [G89.4]. [name] primary complain today is Neck Pain Last encounter: My last encounter with him was on 07/19/2020. Pertinent problems: [name] has Spondylosis, cervical, with myelopathy; Migraines; History of seizures; Generalized anxiety disorder; and Bilateral occipital neuralgia on their pertinent problem list. Pain Assessment: Severity of Chronic pain is reported as a 6 /10. Location: Neck  /head and both shoulders. Onset: More than a month ago. Quality: Aching,Sharp. Timing: Constant. Modifying factor(s): nothing. Vitals:  height is _0  (1.88 m) and weight is 322 lb (146.1 kg) (abnormal). His temporal temperature is 96.7 F (35.9 C) (abnormal). His blood pressure is 126/76 and his pulse is 80. His respiration is 14 and oxygen saturation is 99%.   Reason for encounter: medication management.    No change in medical history since last visit.  Patient's pain is at baseline.  Patient continues multimodal pain regimen as prescribed.  States that it provides some pain relief and improvement in functional status. He has been  having difficulty at night and particularly with sleeping due to pain in his neck.  He complains of muscle spasms along the trapezius myofascial bands.  I encouraged him to look at myofascial release theragun to help with his cervicalgia. We also discussed a potential lidocaine infusion EKG QTC is less than 470 ms, we can plan for that.  Pharmacotherapy Assessment   Analgesic: Hydrocodone extended release 10 mg twice daily, hydrocodone immediate release 10 mg 3 times daily as needed for breakthrough pain    Monitoring: Thomas Cole PMP: PDMP reviewed during this encounter.       Pharmacotherapy: No side-effects or adverse reactions reported. Compliance: No problems identified. Effectiveness: Clinically acceptable.  Thomas Martins, RN  07/19/2020 11:57 AM  Sign when Signing Visit Nursing Pain Medication Assessment:  Safety precautions to be maintained throughout the outpatient stay will include: orient to surroundings, keep bed in low position, maintain call bell within reach at all times, provide assistance with transfer out of bed and ambulation.  Medication Inspection Compliance: Pill count conducted under aseptic conditions, in front of the patient. Neither the pills nor the bottle was removed from the patient's sight at any time. Once count was completed pills were immediately returned to the patient in their original bottle.  Medication #1: Hydrocodone/APAP Last Medication intake:  Today  Did not bring pill bottle, states took last one today.  Medication #2: Hydrocodone Bitrate  Pill/Patch Count: 16 of 60 pills remain Pill/Patch Appearance: Markings consistent with prescribed medication Bottle Appearance: Standard pharmacy container. Clearly labeled. Filled Date: 06/23/2020 Last Medication intake:  Today    UDS:  Summary  Date Value Ref Range Status  10/27/2019 Note  Final    Comment:    ====================================================================  [facility] 13  (MW) ==================================================================== Test                             Result       Flag       Units  Drug Present and Declared for Prescription Verification   7-aminoclonazepam              103          EXPECTED   ng/mg creat    7-aminoclonazepam is an expected metabolite of clonazepam. Source of    clonazepam is a scheduled prescription medication.    Hydrocodone                    413          EXPECTED   ng/mg creat   Hydromorphone                  122          EXPECTED   ng/mg creat   Norhydrocodone                 2478         EXPECTED   ng/mg creat    Sources of hydrocodone include scheduled prescription medications.    Hydromorphone and norhydrocodone are expected metabolites of    hydrocodone. Hydromorphone is also available as a scheduled    prescription medication.  Drug Absent but Declared for Prescription Verification   Butalbital                     Not Detected UNEXPECTED ==================================================================== Test                      Result    Flag   Units      Ref Range   Creatinine              120              mg/dL      >=20 ==================================================================== Declared Medications:  The flagging and interpretation on this report are based on the  following declared medications.  Unexpected results may arise from  inaccuracies in the declared medications.   **Note: The testing scope of this panel includes these medications:   Butalbital (Fioricet)  Clonazepam (Klonopin)  Hydrocodone   **Note: The testing scope of this panel does not include the  following reported medications:   Acetaminophen (Fioricet)  Acetaminophen  Caffeine (Fioricet)  Carbidopa (Sinemet)  Cholecalciferol  Cyanocobalamin  Duloxetine (Cymbalta)  Hydrochlorothiazide  Levodopa (Sinemet)  Losartan (Cozaar)  Nortriptyline (Pamelor)  Omeprazole (Prilosec)  Ondansetron (Zofran)   Polyethylene Glycol (MiraLAX)  Pregabalin (Lyrica)  Propranolol (Inderal)  Sucralfate (Carafate)  Tizanidine (Zanaflex)  Valsartan (Diovan) ==================================================================== For clinical consultation, please call [phone]. ====================================================================      ROS  Constitutional: Denies any fever or chills Gastrointestinal: No reported hemesis, hematochezia, vomiting, or acute GI distress Musculoskeletal: cervicalgia Neurological: No reported episodes of acute onset apraxia, aphasia, dysarthria, agnosia, amnesia, paralysis, loss of coordination, or loss of consciousness  Medication Review  DULoxetine, HYDROcodone Bitartrate ER, HYDROcodone-acetaminophen, Vitamin D3, butalbital-acetaminophen-caffeine, carbidopa-levodopa, clonazePAM, entacapone, glipizide-metformin, hydrochlorothiazide, losartan, metFORMIN, nortriptyline, omeprazole, phenytoin, pregabalin, propranolol, sucralfate, tiZANidine, and vitamin B-12  History Review  Allergy: [name] is allergic to amitriptyline, bee venom, chlorhexidine, carbamazepine, meloxicam, morphine and related, and sulfa antibiotics. Drug: [name]  reports no history of drug  use. Alcohol:  reports no history of alcohol use. Tobacco:  reports that he has never smoked. He has never used smokeless tobacco. Social: Mr. Armbrust  reports that he has never smoked. He has never used smokeless tobacco. He reports that he does not drink alcohol and does not use drugs. Medical:  has a past medical history of Anxiety, Asthma, Back problem, Benign essential hypertension (11/30/2016), Cervical spondylosis without myelopathy (09/29/5282), Complication of anesthesia, Depression, Diabetes mellitus without complication ([place]) (13/2440), GERD (gastroesophageal reflux disease), Hypertension, Labile hypertension (03/19/2013), Major depressive disorder, recurrent episode, severe ([place]) (06/23/2015),  Migraine headache, Migraines (11/11/2013), S/P appendectomy (04/09/2011), Seizure ([place]) (11/30/2016), Seizures ([place]), Shortness of breath, and SOB (shortness of breath) (08/06/2014). Surgical: Mr. Cleary  has a past surgical history that includes Knee surgery; Posterior fusion cervical spine; Cervical disc surgery; Appendectomy (12); Anterior cervical corpectomy (N/A, 09/15/2012); Cholecystectomy (N/A, 03/29/2017); Colonoscopy with propofol (N/A, 10/23/2019); and Esophagogastroduodenoscopy (egd) with propofol (N/A, 10/23/2019). Family: family history includes Arthritis in his father, maternal grandfather, maternal grandmother, mother, paternal grandfather, and paternal grandmother; Asthma in his mother; Coronary artery disease in his father and mother; Heart disease in his father, maternal grandfather, maternal grandmother, paternal grandfather, and paternal grandmother; Hypertension in his maternal grandfather, maternal grandmother, mother, paternal grandfather, and paternal grandmother; Mental illness in his mother.  Laboratory Chemistry Profile   Renal Lab Results  Component Value Date   BUN 13 04/21/2017   CREATININE 0.93 04/21/2017   LABCREA 79.3 03/05/2014   GFR 82.19 08/05/2014   GFRAA >60 04/21/2017   GFRNONAA >60 04/21/2017     Hepatic Lab Results  Component Value Date   AST 30 04/21/2017   ALT 23 04/21/2017   ALBUMIN 4.2 04/21/2017   ALKPHOS 86 04/21/2017   LIPASE 51 04/21/2017     Electrolytes Lab Results  Component Value Date   NA 138 04/21/2017   K 3.7 04/21/2017   CL 101 04/21/2017   CALCIUM 9.4 04/21/2017   MG 1.9 04/26/2020     Bone Lab Results  Component Value Date   VD25OH 31.95 03/05/2016   25OHVITD1 35 04/26/2020   25OHVITD2 <1.0 04/26/2020   25OHVITD3 35 04/26/2020     Inflammation (CRP: Acute Phase) (ESR: Chronic Phase) Lab Results  Component Value Date   CRP 10 04/26/2020   ESRSEDRATE 22 04/26/2020       Note: Above Lab results reviewed.  Recent  Imaging Review  NM GASTRIC EMPTYING CLINICAL DATA:  Epigastric pain  EXAM: NUCLEAR MEDICINE GASTRIC EMPTYING SCAN  TECHNIQUE: After oral ingestion of radiolabeled meal, sequential abdominal images were obtained for 4 hours. Percentage of activity emptying the stomach was calculated at 1 hour, 2 hour, 3 hour, and 4 hours.  RADIOPHARMACEUTICALS:  2.57 mCi Tc-70msulfur colloid in standardized meal  COMPARISON:  Ultrasound 04/21/2017  FINDINGS: Expected location of the stomach in the left upper quadrant. Ingested meal empties the stomach gradually over the course of the study.  49% emptied at 1 hr ( normal >= 10%)  67% emptied at 2 hr ( normal >= 40%)  81% emptied at 3 hr ( normal >= 70%)  100% emptied at 4 hr ( normal >= 90%)  IMPRESSION: Normal gastric emptying study.  Electronically Signed   By: KDonavan FoilM.D.   On: 08/24/2019 16:55 Note: Reviewed        Physical Exam  General appearance: Well nourished, well developed, and well hydrated. In no apparent acute distress Mental status: Alert, oriented x 3 (person, place, &  time)       Respiratory: No evidence of acute respiratory distress Eyes: PERLA Vitals: BP 126/76   Pulse 80   Temp (!) 96.7 F (35.9 C) (Temporal)   Resp 14   Ht _0  (1.88 m)   Wt (!) 322 lb (146.1 kg)   SpO2 99%   BMI 41.34 kg/m  BMI: Estimated body mass index is 41.34 kg/m as calculated from the following:   Height as of this encounter: _1  (1.88 m).   Weight as of this encounter: 322 lb (146.1 kg). Ideal: Ideal body weight: 82.2 kg (181 lb 3.5 oz) Adjusted ideal body weight: 107.7 kg (237 lb 8.5 oz)  Cervical Spine Area Exam  Skin & Axial Inspection:Well healed scar from previous spine surgery detected Alignment:Symmetrical Functional GLO:VFIE restricted ROM, bilaterally Stability:No instability detected Muscle Tone/Strength:Functionally intact. No obvious neuro-muscular anomalies detected. Sensory  (Neurological):Dermatomal pain patternand neurogenic Palpation:No palpable anomalies  Upper Extremity (UE) Exam    Side:Right upper extremity  Side:Left upper extremity  Skin & Extremity Inspection:Skin color, temperature, and hair growth are WNL. No peripheral edema or cyanosis. No masses, redness, swelling, asymmetry, or associated skin lesions. No contractures.  Skin & Extremity Inspection:Skin color, temperature, and hair growth are WNL. No peripheral edema or cyanosis. No masses, redness, swelling, asymmetry, or associated skin lesions. No contractures.  Functional PPI:RJJO restricted ROMfor shoulder and elbow  Functional ACZ:YSAY restricted ROMfor shoulder and elbow  Muscle Tone/Strength:Functionally intact. No obvious neuro-muscular anomalies detected.  Muscle Tone/Strength:Functionally intact. No obvious neuro-muscular anomalies detected.  Sensory (Neurological):Unimpaired  Sensory (Neurological):Unimpaired  Palpation:No palpable anomalies  Palpation:No palpable anomalies  Provocative Test(s): Phalen's test:deferred Tinel's test:deferred Apley's scratch test (touch opposite shoulder): Action 1 (Across chest):Decreased ROM Action 2 (Overhead):Decreased ROM Action 3 (LB reach):Decreased ROM    Provocative Test(s): Phalen's test:deferred Tinel's test:deferred Apley's scratch test (touch opposite shoulder): Action 1 (Across chest):Decreased ROM Action 2 (Overhead):Decreased ROM Action 3 (LB reach):Decreased ROM   5 out of 5 strength bilateral upper extremity: Shoulder abduction, elbow flexion, elbow extension, thumb extension.   Patient presents today in wheelchair.    Assessment   Status Diagnosis  Persistent Persistent Persistent 1. Chronic pain syndrome   2. Spondylosis, cervical, with myelopathy   3. Myelopathy of cervical spinal cord with cervical radiculopathy (HCC)    4. Thoracic radiculopathy   5. H/O cervical spinal arthrodesis (C3-C7 fusion)   6. Cervical facet joint syndrome   7. Thoracic facet joint syndrome   8. Failed cervical fusion   9. Abdominal pain, left lower quadrant   10. Myalgia   11. Bilateral occipital neuralgia       Plan of Care  [name] has a current medication list which includes the following long-term medication(s): carbidopa-levodopa, carbidopa-levodopa, clonazepam, glipizide-metformin, metformin, nortriptyline, pregabalin, propranolol, sucralfate, [START ON 07/23/2020] hydrocodone-acetaminophen, [START ON 08/22/2020] hydrocodone-acetaminophen, and [START ON 09/21/2020] hydrocodone-acetaminophen.  Pharmacotherapy (Medications Ordered): Meds ordered this encounter  Medications  . HYDROcodone Bitartrate ER 10 MG CP12    Sig: Take 10 mg by mouth every 12 (twelve) hours. Must last 30 days.    Dispense:  60 capsule    Refill:  0    Chronic Pain. (STOP Act - Not applicable). Fill one day early if closed on scheduled refill date.  Marland Kitchen HYDROcodone Bitartrate ER 10 MG CP12    Sig: Take 10 mg by mouth every 12 (twelve) hours. Must last 30 days.    Dispense:  60 capsule    Refill:  0  Chronic Pain. (STOP Act - Not applicable). Fill one day early if closed on scheduled refill date.  Marland Kitchen HYDROcodone Bitartrate ER 10 MG CP12    Sig: Take 10 mg by mouth every 12 (twelve) hours. Must last 30 days.    Dispense:  60 capsule    Refill:  0    Chronic Pain. (STOP Act - Not applicable). Fill one day early if closed on scheduled refill date.  Marland Kitchen HYDROcodone-acetaminophen (NORCO) 10-325 MG tablet    Sig: Take 1 tablet by mouth 3 (three) times daily as needed. For chronic pain syndrome. Each Rx to last 30 days.    Dispense:  90 tablet    Refill:  0  . HYDROcodone-acetaminophen (NORCO) 10-325 MG tablet    Sig: Take 1 tablet by mouth 3 (three) times daily as needed. For chronic pain syndrome. Each Rx to last 30 days.    Dispense:  90  tablet    Refill:  0  . HYDROcodone-acetaminophen (NORCO) 10-325 MG tablet    Sig: Take 1 tablet by mouth 3 (three) times daily as needed. For chronic pain syndrome. Each Rx to last 30 days.    Dispense:  90 tablet    Refill:  0  . tiZANidine (ZANAFLEX) 4 MG tablet    Sig: Take 1 tablet (4 mg total) by mouth every 8 (eight) hours as needed for muscle spasms.    Dispense:  90 tablet    Refill:  11   Orders:  Orders Placed This Encounter  Procedures  . LIDOCAINE INFUSION    Standing Status:   Future    Standing Expiration Date:   08/18/2020    Scheduling Instructions:     Laterality: Does not apply     Level(s): Peripheral vascular access     Sedation: With IV Sedation     Scheduling Timeframe: As soon as pre-approved     NOTE: Continuous cardiovascular monitoring required (ECG, NIBPM, SpO2)    Order Specific Question:   Where will this procedure be performed?    Answer:   [facility]  . EKG 12-Lead    This ECG is being ordered as part of a work-up prior to high-risk medication prescribing.    Scheduling Instructions:     Please evaluate for QT/QTc prolongation, as well as 2nd degree AV blocks prior to high-risk medication therapy.   Follow-up plan:   Return in about 3 weeks (around 08/09/2020) for lidocaine infusion (ok to double book).     Diagnostic cervical facet medial branch nerve blocks C3-C7 b/l 05/06/19- did not help Diagnostic thoracic facet medial branch nerve blocks T1-T2, consider bilateral occipital nerve block  Cervical/trapezius trigger point injections SPG block                 Recent Visits Date Type Provider Dept  04/26/20 Office Visit Thomas Santa, MD [facility]  Showing recent visits within past 90 days and meeting all other requirements Today's Visits Date Type Provider Dept  07/19/20 Office Visit Thomas Santa, MD [facility]  Showing today's visits and meeting all other requirements Future Appointments Date Type Provider  Dept  08/15/20 Appointment Thomas Santa, MD [facility]  Showing future appointments within next 90 days and meeting all other requirements  I discussed the assessment and treatment plan with the patient. The patient was provided an opportunity to ask questions and all were answered. The patient agreed with the plan and demonstrated an understanding of the instructions.  Patient  advised to call back or seek an in-person evaluation if the symptoms or condition worsens.  Duration of encounter: 30 minutes.  Note by: Thomas Santa, MD Date: 07/19/2020; Time: 1:37 PM

## 2020-07-19 NOTE — Progress Notes (Signed)
Nursing Pain Medication Assessment:  Safety precautions to be maintained throughout the outpatient stay will include: orient to surroundings, keep bed in low position, maintain call bell within reach at all times, provide assistance with transfer out of bed and ambulation.  Medication Inspection Compliance: Pill count conducted under aseptic conditions, in front of the patient. Neither the pills nor the bottle was removed from the patient's sight at any time. Once count was completed pills were immediately returned to the patient in their original bottle.  Medication #1: Hydrocodone/APAP Last Medication intake:  Today  Did not bring pill bottle, states took last one today.  Medication #2: Hydrocodone Bitrate  Pill/Patch Count: 16 of 60 pills remain Pill/Patch Appearance: Markings consistent with prescribed medication Bottle Appearance: Standard pharmacy container. Clearly labeled. Filled Date: 06/23/2020 Last Medication intake:  Today

## 2020-07-20 ENCOUNTER — Telehealth: Payer: Self-pay | Admitting: Adult Health Nurse Practitioner

## 2020-07-20 NOTE — Telephone Encounter (Signed)
Called patient to touch base with him to see if he had found a caregiver and to see if he wanted to reschedule the Palliative Consult, no answer - left message requesting a return call.

## 2020-07-21 ENCOUNTER — Encounter: Payer: 59 | Admitting: Student in an Organized Health Care Education/Training Program

## 2020-07-28 ENCOUNTER — Telehealth: Payer: Self-pay | Admitting: Adult Health Nurse Practitioner

## 2020-07-28 NOTE — Telephone Encounter (Signed)
Called patient and left a voicemail, requesting a return call to let me know if he wanted to pursue Palliative services or not, left my name and call back number.

## 2020-08-12 ENCOUNTER — Other Ambulatory Visit: Payer: Self-pay | Admitting: Student in an Organized Health Care Education/Training Program

## 2020-08-15 ENCOUNTER — Other Ambulatory Visit: Payer: Self-pay

## 2020-08-15 ENCOUNTER — Ambulatory Visit
Payer: 59 | Attending: Student in an Organized Health Care Education/Training Program | Admitting: Student in an Organized Health Care Education/Training Program

## 2020-08-15 ENCOUNTER — Encounter: Payer: Self-pay | Admitting: Student in an Organized Health Care Education/Training Program

## 2020-08-15 VITALS — BP 130/82 | HR 80 | Temp 97.2°F | Resp 15 | Ht 74.0 in | Wt 325.8 lb

## 2020-08-15 DIAGNOSIS — M4722 Other spondylosis with radiculopathy, cervical region: Secondary | ICD-10-CM | POA: Insufficient documentation

## 2020-08-15 DIAGNOSIS — Z888 Allergy status to other drugs, medicaments and biological substances status: Secondary | ICD-10-CM | POA: Diagnosis not present

## 2020-08-15 DIAGNOSIS — M4712 Other spondylosis with myelopathy, cervical region: Secondary | ICD-10-CM | POA: Diagnosis not present

## 2020-08-15 DIAGNOSIS — G894 Chronic pain syndrome: Secondary | ICD-10-CM | POA: Diagnosis not present

## 2020-08-15 DIAGNOSIS — Z79899 Other long term (current) drug therapy: Secondary | ICD-10-CM | POA: Insufficient documentation

## 2020-08-15 DIAGNOSIS — M25559 Pain in unspecified hip: Secondary | ICD-10-CM | POA: Diagnosis not present

## 2020-08-15 DIAGNOSIS — Z981 Arthrodesis status: Secondary | ICD-10-CM

## 2020-08-15 DIAGNOSIS — M25519 Pain in unspecified shoulder: Secondary | ICD-10-CM | POA: Diagnosis not present

## 2020-08-15 DIAGNOSIS — Z885 Allergy status to narcotic agent status: Secondary | ICD-10-CM | POA: Insufficient documentation

## 2020-08-15 DIAGNOSIS — M960 Pseudarthrosis after fusion or arthrodesis: Secondary | ICD-10-CM

## 2020-08-15 DIAGNOSIS — G959 Disease of spinal cord, unspecified: Secondary | ICD-10-CM

## 2020-08-15 DIAGNOSIS — M5414 Radiculopathy, thoracic region: Secondary | ICD-10-CM

## 2020-08-15 DIAGNOSIS — M5481 Occipital neuralgia: Secondary | ICD-10-CM | POA: Diagnosis not present

## 2020-08-15 DIAGNOSIS — M5412 Radiculopathy, cervical region: Secondary | ICD-10-CM

## 2020-08-15 MED ORDER — LIDOCAINE IN D5W 4-5 MG/ML-% IV SOLN
4.0000 mg/min | INTRAVENOUS | Status: AC
  Administered 2020-08-15: 4 mg/min via INTRAVENOUS
  Filled 2020-08-15: qty 500

## 2020-08-15 NOTE — Progress Notes (Signed)
PROVIDER NOTE: Information contained herein reflects review and annotations entered in association with encounter. Interpretation of such information and data should be left to medically-trained personnel. Information provided to patient can be located elsewhere in the medical record under "Patient Instructions". Document created using STT-dictation technology, any transcriptional errors that may result from process are unintentional.    Patient: Thomas Cole  Service Category: Procedure  Provider: Gillis Santa, MD  DOB: May 27, 1970  DOS: 08/15/2020  Location: [facility]  MRN: [id number]  Setting: Ambulatory - outpatient  Referring Provider: Derinda Late, MD  Type: Established Patient  Specialty: Interventional Pain Management  PCP: Derinda Late, MD   Primary Reason for Visit: Interventional Pain Management Treatment. CC: Neck Pain, Shoulder Pain, and Hip Pain  Procedure:           Type: Palliative Intravenous lidocaine infusion Region: Systemic Level: Upper Extremity IV access Laterality: Please see nurses note.    Indications: 1. Chronic pain syndrome   2. Spondylosis, cervical, with myelopathy   3. Myelopathy of cervical spinal cord with cervical radiculopathy (HCC)   4. Thoracic radiculopathy   5. H/O cervical spinal arthrodesis (C3-C7 fusion)   6. Failed cervical fusion   7. Bilateral occipital neuralgia    Pain Score: Pre-procedure: 7 /10 Post-procedure: 4 /10   Pre-op H&P Assessment:  Thomas Cole is a 50 y.o. (year old), male patient, seen today for interventional treatment. He  has a past surgical history that includes Knee surgery; Posterior fusion cervical spine; Cervical disc surgery; Appendectomy (12); Anterior cervical corpectomy (N/A, 09/15/2012); Cholecystectomy (N/A, 03/29/2017); Colonoscopy with propofol (N/A, 10/23/2019); and Esophagogastroduodenoscopy (egd) with propofol (N/A, 10/23/2019). Thomas Cole has a current medication list which includes the  following prescription(s): butalbital-acetaminophen-caffeine, carbidopa-levodopa, carbidopa-levodopa, vitamin d3, clonazepam, duloxetine, entacapone, glipizide-metformin, hydrochlorothiazide, hydrocodone bitartrate er, [START ON 08/22/2020] hydrocodone bitartrate er, [START ON 09/21/2020] hydrocodone bitartrate er, hydrocodone-acetaminophen, [START ON 08/22/2020] hydrocodone-acetaminophen, [START ON 09/21/2020] hydrocodone-acetaminophen, losartan, metformin, nortriptyline, omeprazole, phenytoin, pregabalin, propranolol, sucralfate, tizanidine, and vitamin b-12. His primarily concern today is the Neck Pain, Shoulder Pain, and Hip Pain  Initial Vital Signs:  Pulse/HCG Rate: 80ECG Heart Rate: 78 Temp: (!) 97.2 F (36.2 C) Resp: 18 BP: 112/71 SpO2: 98 %  BMI: Estimated body mass index is 41.83 kg/m as calculated from the following:   Height as of this encounter: 6\' 2"  (1.88 m).   Weight as of this encounter: 325 lb 12.8 oz (147.8 kg).  Risk Assessment: Allergies: Reviewed. He is allergic to amitriptyline, bee venom, chlorhexidine, carbamazepine, meloxicam, morphine and related, and sulfa antibiotics.  Allergy Precautions: None required Coagulopathies: Reviewed. None identified.  Blood-thinner therapy: None at this time Active Infection(s): Reviewed. None identified. Thomas Cole is afebrile  Site Confirmation: Thomas Cole was asked to confirm the procedure and laterality before marking the site Procedure checklist: Completed Consent: Before the procedure and under the influence of no sedative(s), amnesic(s), or anxiolytics, the patient was informed of the treatment options, risks and possible complications. To fulfill our ethical and legal obligations, as recommended by the American Medical Association's Code of Ethics, I have informed the patient of my clinical impression; the nature and purpose of the treatment or procedure; the risks, benefits, and possible complications of the intervention; the  alternatives, including doing nothing; the risk(s) and benefit(s) of the alternative treatment(s) or procedure(s); and the risk(s) and benefit(s) of doing nothing. The patient was provided information about the general risks and possible complications associated with any invasive procedure. These may include, but are  not limited to: failure to achieve desired goals; pain; worsening of initial condition; infections; bleeding; organ or nerve damage; allergic reactions; and death. In addition, the patient was informed of those risks and complications associated to this procedure, such as failure to decrease pain; infection; bleeding; phlebitis; vascular extravasation; tissue necrosis; tenderness at IV access site; skin or nerve damage with subsequent damage to sensory, motor, and/or autonomic systems; worsening of the pain; persistent pain, numbness, and/or weakness of one or several areas of the body; cardiac dysrhythmias; seizure; stroke; and/or death. Furthermore, the patient was informed of those risks and complications associated with the medications used during the procedure. These include, but are not limited to: allergic reactions (i.e.: anaphylactic or anaphylactoid reactions); cardiac conduction blockade; poisoning; toxicity; CNS depression; cardiovascular depression and collapse; muscle twitching; tonic-clonic seizures; convulsions; loss of consciousness; coma; respiratory depression; arrest; and/or death. Finally, the patient was informed that Medicine is not an exact science; therefore, there is also the possibility of unforeseen or unpredictable risks and/or possible complications that may result in a catastrophic outcome. The patient indicated having understood very clearly. We have given the patient no guarantees and we have made no promises. Enough time was given to the patient to ask questions, all of which were answered to the patient's satisfaction. Thomas Cole has indicated that he wanted to  continue with the procedure. Attestation: I, the ordering provider, attest that I have discussed with the patient the benefits, risks, side-effects, alternatives, likelihood of achieving goals, and potential problems during recovery for the procedure that I have provided informed consent. Date  Time: 08/15/2020  9:28 AM  Pre-Procedure Preparation:  Monitoring: As per clinic protocol. Respiration, ETCO2, SpO2, BP, heart rate and rhythm monitor placed and checked for adequate function Safety Precautions: Patient was assessed for positional comfort and pressure points before starting the procedure. Time-out: I initiated and conducted the "Time-out" before starting the procedure, as per protocol. The patient was asked to participate by confirming the accuracy of the "Time Out" information. Verification of the correct person, site, and procedure were performed and confirmed by me, the nursing staff, and the patient. "Time-out" conducted as per Joint Commission's Universal Protocol (UP.01.01.01). Time: 1044  Description of Procedure:          Target Area: Intravenous Approach: Intravenous angiocath approach. Area Prepped: Antecubital DuraPrep (Iodine Povacrylex [0.7% available iodine] and Isopropyl Alcohol, 74% w/w) Safety Precautions: Medications properly checked for expiration dates. SDV (single dose vial) medications used.  Dose Calculation: Lidocaine preparation: 2 grams of IV lidocaine in 500 mL of D5W (4 mg/mL) (0.4% Lidocaine) Maximum Lidocaine Dose: 4 mg/kg x (!) 325 lb 12.8 oz (147.8 kg) =  584 mg.  Calculated infusion rate: 584 mg divided by 4 mg/mL = 163mL in 60 minutes (145 mL/hr) Weight: (!) 325 lb 12.8 oz (147.8 kg) Set rate: 145 mL/hr Duration of Infusion: 1 hour  Description of the Procedure: Protocol guidelines were followed. The patient was placed in position. Informed consent was obtained.  He was cautioned to be on the look out for prodromal symptoms of a possible impending  seizure, such as: new onset tinnitus; perioral, circumoral, or tongue numbness; or a metallic taste in his mouth, lightheadedness; dizziness; visual or auditory disturbances; disorientation; profound drowsiness; or muscle twitching. An IV was started and the patient's dose calculated as above. The infusion was carried out with a nurse at his side 100% of the time, monitoring his vitals as per protocol. Pre-procedure sedation was started 5-10 minutes before  infusion and midazolam was kept at bedside during the entire procedure, along with CPR equipment, as per protocol.  Vitals:   08/15/20 1118 08/15/20 1128 08/15/20 1138 08/15/20 1149  BP: 128/76 130/78 133/86 130/82  Pulse:      Resp: 14 14 14 15   Temp:      SpO2: 96% 98% 97% 98%  Weight:      Height:        Start Time: 1049 hrs. End Time: 1149 hrs. Materials: IV infusion pump Medication(s): IV Lidocaine.   Post-operative Assessment:  Post-procedure Vital Signs:  Pulse/HCG Rate: 8082 Temp: (!) 97.2 F (36.2 C) Resp: 15 BP: 130/82 SpO2: 98 %  EBL: None  Complications: No immediate post-treatment complications observed by team, or reported by patient.  Note: The patient tolerated the entire procedure well. A repeat set of vitals were taken after the procedure and the patient was kept under observation following institutional policy, for this type of procedure. Post-procedural neurological assessment was performed, showing return to baseline, prior to discharge. The patient was provided with post-procedure discharge instructions, including a section on how to identify potential problems. Should any problems arise concerning this procedure, the patient was given instructions to immediately contact us, at any time, without hesitation. In any case, we plan to contact the patient by telephone for a follow-up status report regarding this interventional procedure.  Comments:  No additional relevant information.  Plan of Care    Medications ordered for procedure: Meds ordered this encounter  Medications  . lidocaine (cardiac) 2000 mg in dextrose 5% 500 mL (4mg /mL) IV infusion    IV Lidocaine 2 grams in 500 mL of Lactated Ringer's(LR). (4 mg/mL) (0.4% Lidocaine)   Medications administered: We administered lidocaine.  See the medical record for exact dosing rate.  Disposition: Discharge home  Discharge Date & Time: 08/15/2020; 1202 hrs.   Physician-requested Follow-up: No follow-ups on file. Recent Visits Date Type Provider Dept  07/19/20 Office Visit Gillis Santa, MD [facility]  Showing recent visits within past 90 days and meeting all other requirements Today's Visits Date Type Provider Dept  08/15/20 Procedure visit Gillis Santa, MD [facility]  Showing today's visits and meeting all other requirements Future Appointments Date Type Provider Dept  09/14/20 Appointment Gillis Santa, MD [facility]  10/18/20 Appointment Gillis Santa, MD [facility]  Showing future appointments within next 90 days and meeting all other requirements  Primary Care Physician: Derinda Late, MD Location: [facility] Note by: Gillis Santa, MD Date: 08/15/2020; Time: 1:18 PM  Disclaimer:  Medicine is not an exact science. The only guarantee in medicine is that nothing is guaranteed. It is important to note that the decision to proceed with this intervention was based on the information collected from the patient. The Data and conclusions were drawn from the patient's questionnaire, the interview, and the physical examination. Because the information was provided in large part by the patient, it cannot be guaranteed that it has not been purposely or unconsciously manipulated. Every effort has been made to obtain as much relevant data as possible for this evaluation. It is important to note that the conclusions that lead to this procedure are derived in  large part from the available data. Always take into account that the treatment will also be dependent on availability of resources and existing treatment guidelines, considered by other Pain Management Practitioners as being common knowledge and practice, at the time of the intervention. For Medico-Legal purposes,  it is also important to point out that variation in procedural techniques and pharmacological choices are the acceptable norm. The indications, contraindications, technique, and results of the above procedure should only be interpreted and judged by a Board-Certified Interventional Pain Specialist with extensive familiarity and expertise in the same exact procedure and technique.

## 2020-08-15 NOTE — Patient Instructions (Signed)

## 2020-08-15 NOTE — Progress Notes (Signed)
Safety precautions to be maintained throughout the outpatient stay will include: orient to surroundings, keep bed in low position, maintain call bell within reach at all times, provide assistance with transfer out of bed and ambulation.  

## 2020-08-16 ENCOUNTER — Telehealth: Payer: Self-pay | Admitting: *Deleted

## 2020-08-16 NOTE — Telephone Encounter (Signed)
States he got some relief after discharge yesterday but pain was return to baseline before bedtime last night.

## 2020-09-08 ENCOUNTER — Encounter: Payer: Self-pay | Admitting: Student in an Organized Health Care Education/Training Program

## 2020-09-08 MED ORDER — NORTRIPTYLINE HCL 25 MG PO CAPS: 50.0000 mg | ORAL_CAPSULE | Freq: Every day | ORAL | 5 refills | Status: DC

## 2020-09-13 ENCOUNTER — Encounter: Payer: Self-pay | Admitting: Student in an Organized Health Care Education/Training Program

## 2020-09-14 ENCOUNTER — Ambulatory Visit
Payer: Self-pay | Attending: Student in an Organized Health Care Education/Training Program | Admitting: Student in an Organized Health Care Education/Training Program

## 2020-09-14 ENCOUNTER — Other Ambulatory Visit: Payer: Self-pay

## 2020-09-14 ENCOUNTER — Encounter: Payer: Self-pay | Admitting: Student in an Organized Health Care Education/Training Program

## 2020-09-14 DIAGNOSIS — G959 Disease of spinal cord, unspecified: Secondary | ICD-10-CM

## 2020-09-14 DIAGNOSIS — G894 Chronic pain syndrome: Secondary | ICD-10-CM

## 2020-09-14 DIAGNOSIS — M5412 Radiculopathy, cervical region: Secondary | ICD-10-CM

## 2020-09-14 DIAGNOSIS — M5414 Radiculopathy, thoracic region: Secondary | ICD-10-CM

## 2020-09-14 DIAGNOSIS — M960 Pseudarthrosis after fusion or arthrodesis: Secondary | ICD-10-CM

## 2020-09-14 DIAGNOSIS — M5481 Occipital neuralgia: Secondary | ICD-10-CM

## 2020-09-14 NOTE — Progress Notes (Signed)
Wendi Snipes POA and caregiver called with some information.  She will be unable to be present for VV and would like to convey a few things.     Medicare effective since August 31, 2020 and they are now considering mail order Rx services.  Alma Friendly will investigate pharmacy choices for mail order and let us know.   2.  PPE for lidocaine infusion.  Good relief the first day and 1/2 of the next day but then patient became very aggitated and "mean".  She is not sure what caused this, very close to becoming violent and lots of verbal abuse.  He also was having some ideations of harming himself or others.  She feels that this was all a reaction to the medication.   3.  Alma Friendly [phone]  if you need to contact her, she states she will step out of her meeting.

## 2020-09-14 NOTE — Progress Notes (Signed)
Patient: Thomas Cole  Service Category: E/M  Provider: Gillis Santa, MD  DOB: Apr 18, 1970  DOS: 09/14/2020  Location: Office  MRN: [id number]  Setting: Ambulatory outpatient  Referring Provider: Derinda Late, MD  Type: Established Patient  Specialty: Interventional Pain Management  PCP: Derinda Late, MD  Location: Remote location  Delivery: TeleHealth     Virtual Encounter - Pain Management PROVIDER NOTE: Information contained herein reflects review and annotations entered in association with encounter. Interpretation of such information and data should be left to medically-trained personnel. Information provided to patient can be located elsewhere in the medical record under "Patient Instructions". Document created using STT-dictation technology, any transcriptional errors that may result from process are unintentional.    Contact & Pharmacy Preferred: [phone] Home: [phone] (home) Mobile: [phone] (mobile) E-mail: [email or web address]  [place], [place] - [address] [place] [address] Phone: [phone] Fax: [phone]   Pre-screening  Thomas Cole offered "in-person" vs "virtual" encounter. He indicated preferring virtual for this encounter.   Reason COVID-19*  Social distancing based on CDC and AMA recommendations.   I contacted Thomas Cole on 09/14/2020 via video conference.      I clearly identified myself as Gillis Santa, MD. I verified that I was speaking with the correct person using two identifiers (Name: Thomas Cole, and date of birth: April 12, 1970).  Consent I sought verbal advanced consent from Thomas Cole for virtual visit interactions. I informed Thomas Cole of possible security and privacy concerns, risks, and limitations associated with providing "not-in-person" medical evaluation and management services. I also informed Thomas Cole of the availability of "in-person" appointments. Finally, I  informed him that there would be a charge for the virtual visit and that he could be  personally, fully or partially, financially responsible for it. Thomas Cole expressed understanding and agreed to proceed.   Historic Elements   Thomas Cole is a 50 y.o. year old, male patient evaluated today after our last contact on 08/15/2020. Thomas Cole  has a past medical history of Anxiety, Asthma, Back problem, Benign essential hypertension (11/30/2016), Cervical spondylosis without myelopathy (3/88/8280), Complication of anesthesia, Depression, Diabetes mellitus without complication ([place]) (06/4915), GERD (gastroesophageal reflux disease), Hypertension, Labile hypertension (03/19/2013), Major depressive disorder, recurrent episode, severe ([place]) (06/23/2015), Migraine headache, Migraines (11/11/2013), S/P appendectomy (04/09/2011), Seizure ([place]) (11/30/2016), Seizures ([place]), Shortness of breath, and SOB (shortness of breath) (08/06/2014). He also  has a past surgical history that includes Knee surgery; Posterior fusion cervical spine; Cervical disc surgery; Appendectomy (12); Anterior cervical corpectomy (N/A, 09/15/2012); Cholecystectomy (N/A, 03/29/2017); Colonoscopy with propofol (N/A, 10/23/2019); and Esophagogastroduodenoscopy (egd) with propofol (N/A, 10/23/2019). Thomas Cole has a current medication list which includes the following prescription(s): butalbital-acetaminophen-caffeine, carbidopa-levodopa, carbidopa-levodopa, vitamin d3, clonazepam, duloxetine, entacapone, glipizide-metformin, hydrochlorothiazide, hydrocodone bitartrate er, [START ON 09/21/2020] hydrocodone bitartrate er, hydrocodone-acetaminophen, [START ON 09/21/2020] hydrocodone-acetaminophen, losartan, metformin, nortriptyline, omeprazole, phenytoin, pregabalin, propranolol, sucralfate, tizanidine, vitamin b-12, and hydrocodone-acetaminophen. He  reports that he has never smoked. He has never used smokeless tobacco. He reports that he does not drink alcohol  and does not use drugs. Thomas Cole is allergic to amitriptyline, bee venom, chlorhexidine, carbamazepine, meloxicam, morphine and related, and sulfa antibiotics.   HPI  Today, he is being contacted for a post-procedure assessment.   Post-Procedure Evaluation  Procedure (08/15/2020): Lidocaine gtt  Sedation: None.  Effectiveness during initial hour after procedure(Ultra-Short Term Relief): 30 %   Local anesthetic used: Long-acting (4-6 hours) Effectiveness: Defined as any analgesic benefit  obtained secondary to the administration of local anesthetics. This carries significant diagnostic value as to the etiological location, or anatomical origin, of the pain. Duration of benefit is expected to coincide with the duration of the local anesthetic used.  Effectiveness during initial 4-6 hours after procedure(Short-Term Relief): 30 %   Long-term benefit: Defined as any relief past the pharmacologic duration of the local anesthetics.  Effectiveness past the initial 6 hours after procedure(Long-Term Relief): 0 %   Current benefits: Defined as benefit that persist at this time.   Analgesia:  Back to baseline  Pharmacotherapy Assessment  Analgesic: Hydrocodone extended release 10 mg twice daily, hydrocodone immediate release 10 mg 3 times daily as needed for breakthrough pain    Monitoring: [place] PMP: PDMP reviewed during this encounter.       Pharmacotherapy: No side-effects or adverse reactions reported. Compliance: No problems identified. Effectiveness: Clinically acceptable. Plan: Refer to "POC".  UDS:  Summary  Date Value Ref Range Status  10/27/2019 Note  Final    Comment:    ==================================================================== [employer] Select 13 (MW) ==================================================================== Test                             Result       Flag       Units  Drug Present and Declared for Prescription Verification   7-aminoclonazepam               103          EXPECTED   ng/mg creat    7-aminoclonazepam is an expected metabolite of clonazepam. Source of    clonazepam is a scheduled prescription medication.    Hydrocodone                    413          EXPECTED   ng/mg creat   Hydromorphone                  122          EXPECTED   ng/mg creat   Norhydrocodone                 2478         EXPECTED   ng/mg creat    Sources of hydrocodone include scheduled prescription medications.    Hydromorphone and norhydrocodone are expected metabolites of    hydrocodone. Hydromorphone is also available as a scheduled    prescription medication.  Drug Absent but Declared for Prescription Verification   Butalbital                     Not Detected UNEXPECTED ==================================================================== Test                      Result    Flag   Units      Ref Range   Creatinine              120              mg/dL      >=20 ==================================================================== Declared Medications:  The flagging and interpretation on this report are based on the  following declared medications.  Unexpected results may arise from  inaccuracies in the declared medications.   **Note: The testing scope of this panel includes these medications:   Butalbital (Fioricet)  Clonazepam (Klonopin)  Hydrocodone   **Note: The testing scope of  this panel does not include the  following reported medications:   Acetaminophen (Fioricet)  Acetaminophen  Caffeine (Fioricet)  Carbidopa (Sinemet)  Cholecalciferol  Cyanocobalamin  Duloxetine (Cymbalta)  Hydrochlorothiazide  Levodopa (Sinemet)  Losartan (Cozaar)  Nortriptyline (Pamelor)  Omeprazole (Prilosec)  Ondansetron (Zofran)  Polyethylene Glycol (MiraLAX)  Pregabalin (Lyrica)  Propranolol (Inderal)  Sucralfate (Carafate)  Tizanidine (Zanaflex)  Valsartan (Diovan) ==================================================================== For clinical  consultation, please call [phone]. ====================================================================     Laboratory Chemistry Profile   Renal Lab Results  Component Value Date   BUN 13 04/21/2017   CREATININE 0.93 04/21/2017   LABCREA 79.3 03/05/2014   GFR 82.19 08/05/2014   GFRAA >60 04/21/2017   GFRNONAA >60 04/21/2017     Hepatic Lab Results  Component Value Date   AST 30 04/21/2017   ALT 23 04/21/2017   ALBUMIN 4.2 04/21/2017   ALKPHOS 86 04/21/2017   LIPASE 51 04/21/2017     Electrolytes Lab Results  Component Value Date   NA 138 04/21/2017   K 3.7 04/21/2017   CL 101 04/21/2017   CALCIUM 9.4 04/21/2017   MG 1.9 04/26/2020     Bone Lab Results  Component Value Date   VD25OH 31.95 03/05/2016   25OHVITD1 35 04/26/2020   25OHVITD2 <1.0 04/26/2020   25OHVITD3 35 04/26/2020     Inflammation (CRP: Acute Phase) (ESR: Chronic Phase) Lab Results  Component Value Date   CRP 10 04/26/2020   ESRSEDRATE 22 04/26/2020       Note: Above Lab results reviewed.  Imaging  NM GASTRIC EMPTYING CLINICAL DATA:  Epigastric pain  EXAM: NUCLEAR MEDICINE GASTRIC EMPTYING SCAN  TECHNIQUE: After oral ingestion of radiolabeled meal, sequential abdominal images were obtained for 4 hours. Percentage of activity emptying the stomach was calculated at 1 hour, 2 hour, 3 hour, and 4 hours.  RADIOPHARMACEUTICALS:  2.57 mCi Tc-42msulfur colloid in standardized meal  COMPARISON:  Ultrasound 04/21/2017  FINDINGS: Expected location of the stomach in the left upper quadrant. Ingested meal empties the stomach gradually over the course of the study.  49% emptied at 1 hr ( normal >= 10%)  67% emptied at 2 hr ( normal >= 40%)  81% emptied at 3 hr ( normal >= 70%)  100% emptied at 4 hr ( normal >= 90%)  IMPRESSION: Normal gastric emptying study.  Electronically Signed   By: KDonavan FoilM.D.   On: 08/24/2019 16:55  Assessment  The primary encounter  diagnosis was Chronic pain syndrome. Diagnoses of Myelopathy of cervical spinal cord with cervical radiculopathy ([facility], Thoracic radiculopathy, Failed cervical fusion, and Bilateral occipital neuralgia were also pertinent to this visit.  Plan of Care  Unfortunately, Mr. WHollibaughdid not get any significant long-term pain relief after his lidocaine infusion.  Do not recommend repeating.  Keep follow-up as scheduled for July 19 for medication management.  Follow-up plan:   Return for Keep sch. appt.     Diagnostic cervical facet medial branch nerve blocks C3-C7 b/l 05/06/19- did not help Diagnostic thoracic facet medial branch nerve blocks T1-T2, consider bilateral occipital nerve block  Cervical/trapezius trigger point injections SPG block               Recent Visits Date Type Provider Dept  08/15/20 Procedure visit [facility]illis Santa [facility] 07/19/20 Office Visit LGillis Santa MD [facility]  Showing recent visits within past 90 days and meeting all other requirements Today's Visits Date Type Provider Dept  09/14/20 Telemedicine LGillis Santa  MD [facility]  Showing today's visits and meeting all other requirements Future Appointments Date Type Provider Dept  10/18/20 Appointment Gillis Santa, MD [facility]  Showing future appointments within next 90 days and meeting all other requirements I discussed the assessment and treatment plan with the patient. The patient was provided an opportunity to ask questions and all were answered. The patient agreed with the plan and demonstrated an understanding of the instructions.  Patient advised to call back or seek an in-person evaluation if the symptoms or condition worsens.  Duration of encounter: 107mnutes.  Note by: BGillis Santa MD Date: 09/14/2020; Time: 2:45 PM

## 2020-10-18 ENCOUNTER — Encounter: Payer: 59 | Admitting: Student in an Organized Health Care Education/Training Program

## 2020-10-26 ENCOUNTER — Ambulatory Visit
Admission: RE | Admit: 2020-10-26 | Discharge: 2020-10-26 | Disposition: A | Discharge status: Home / Self Care | Payer: Medicare Other | Source: Ambulatory Visit | Attending: Student in an Organized Health Care Education/Training Program | Admitting: Student in an Organized Health Care Education/Training Program

## 2020-10-26 ENCOUNTER — Other Ambulatory Visit: Payer: Self-pay

## 2020-10-26 ENCOUNTER — Ambulatory Visit (HOSPITAL_BASED_OUTPATIENT_CLINIC_OR_DEPARTMENT_OTHER): Payer: Medicare Other | Admitting: Student in an Organized Health Care Education/Training Program

## 2020-10-26 ENCOUNTER — Telehealth: Payer: Self-pay

## 2020-10-26 ENCOUNTER — Encounter: Payer: Self-pay | Admitting: Student in an Organized Health Care Education/Training Program

## 2020-10-26 VITALS — BP 134/92 | HR 91 | Temp 98.0°F | Resp 16 | Ht 74.0 in | Wt 318.6 lb

## 2020-10-26 DIAGNOSIS — M25551 Pain in right hip: Secondary | ICD-10-CM | POA: Insufficient documentation

## 2020-10-26 DIAGNOSIS — M5414 Radiculopathy, thoracic region: Secondary | ICD-10-CM

## 2020-10-26 DIAGNOSIS — G959 Disease of spinal cord, unspecified: Secondary | ICD-10-CM | POA: Diagnosis not present

## 2020-10-26 DIAGNOSIS — M4712 Other spondylosis with myelopathy, cervical region: Secondary | ICD-10-CM

## 2020-10-26 DIAGNOSIS — M25552 Pain in left hip: Secondary | ICD-10-CM | POA: Insufficient documentation

## 2020-10-26 DIAGNOSIS — G894 Chronic pain syndrome: Secondary | ICD-10-CM | POA: Insufficient documentation

## 2020-10-26 DIAGNOSIS — M5481 Occipital neuralgia: Secondary | ICD-10-CM | POA: Insufficient documentation

## 2020-10-26 DIAGNOSIS — M533 Sacrococcygeal disorders, not elsewhere classified: Secondary | ICD-10-CM

## 2020-10-26 DIAGNOSIS — M960 Pseudarthrosis after fusion or arthrodesis: Secondary | ICD-10-CM

## 2020-10-26 DIAGNOSIS — M5412 Radiculopathy, cervical region: Secondary | ICD-10-CM | POA: Insufficient documentation

## 2020-10-26 MED ORDER — PREGABALIN 100 MG PO CAPS: 100.0000 mg | ORAL_CAPSULE | Freq: Three times a day (TID) | ORAL | 5 refills | Status: DC

## 2020-10-26 MED ORDER — HYDROCODONE BITARTRATE ER 10 MG PO CP12: 10.0000 mg | ORAL_CAPSULE | Freq: Two times a day (BID) | ORAL | 0 refills | Status: DC

## 2020-10-26 MED ORDER — NORTRIPTYLINE HCL 25 MG PO CAPS: 50.0000 mg | ORAL_CAPSULE | Freq: Every day | ORAL | 5 refills | Status: DC

## 2020-10-26 MED ORDER — HYDROCODONE BITARTRATE ER 10 MG PO CP12: 10.0000 mg | ORAL_CAPSULE | Freq: Two times a day (BID) | ORAL | 0 refills | Status: AC

## 2020-10-26 MED ORDER — HYDROCODONE-ACETAMINOPHEN 10-325 MG PO TABS: 1.0000 | ORAL_TABLET | Freq: Four times a day (QID) | ORAL | 0 refills | Status: DC | PRN

## 2020-10-26 MED ORDER — TIZANIDINE HCL 4 MG PO TABS: 4.0000 mg | ORAL_TABLET | Freq: Three times a day (TID) | ORAL | 11 refills | Status: DC | PRN

## 2020-10-26 MED ORDER — DULOXETINE HCL 60 MG PO CPEP: 60.0000 mg | ORAL_CAPSULE | Freq: Every day | ORAL | 5 refills | Status: DC

## 2020-10-26 MED ORDER — HYDROCODONE-ACETAMINOPHEN 10-325 MG PO TABS
1.0000 | ORAL_TABLET | Freq: Four times a day (QID) | ORAL | 0 refills | Status: DC | PRN
Start: 2021-01-13 — End: 2021-01-19

## 2020-10-26 NOTE — Telephone Encounter (Signed)
Received a call from [place] asking about Cymbalta prescription.  States the patient has been taking Cymbalta 60 mg 2 tablets daily from Dr Loney Hering.  Spoke with Dr Holley Raring and he states that he was reducing the patient to 60 mg daily.  Pharmacist notified and he states he will discontinue the remaining scripts from Dr Loney Hering.

## 2020-10-26 NOTE — Progress Notes (Signed)
Nursing Pain Medication Assessment:  Safety precautions to be maintained throughout the outpatient stay will include: orient to surroundings, keep bed in low position, maintain call bell within reach at all times, provide assistance with transfer out of bed and ambulation.  Medication Inspection Compliance: Pill count conducted under aseptic conditions, in front of the patient. Neither the pills nor the bottle was removed from the patient's sight at any time. Once count was completed pills were immediately returned to the patient in their original bottle.  Medication #1: Hydrocodone/APAP Pill/Patch Count:  81 of 90 pills remain Pill/Patch Appearance: Markings consistent with prescribed medication Bottle Appearance: Standard pharmacy container. Clearly labeled. Filled Date: 07 / 20 / 2022 Last Medication intake:  Today  Medication #2:  Hydrocodone Bitrartrate  Pill/Patch Count:  07 of 60 pills remain Pill/Patch Appearance: Markings consistent with prescribed medication Bottle Appearance: Standard pharmacy container. Clearly labeled. Filled Date: 06 / 22 /  2022 Last Medication intake:  Today

## 2020-10-26 NOTE — Patient Instructions (Signed)
Medication Rules  Applies to: All patients receiving prescriptions (written or electronic).  Pharmacy of record: Pharmacy where electronic prescriptions will be sent. If written prescriptions are taken to a different pharmacy, please inform the nursing staff. The pharmacy listed in the electronic medical record should be the one where you would like electronic prescriptions to be sent.  Prescription refills: Only during scheduled appointments. Applies to both, written and electronic prescriptions.  NOTE: The following applies primarily to controlled substances (Opioid* Pain Medications).   Patient's responsibilities: Pain Pills: Bring all pain pills to every appointment (except for procedure appointments). Pill Bottles: Bring pills in original pharmacy bottle. Always bring newest bottle. Bring bottle, even if empty. Medication refills: You are responsible for knowing and keeping track of what medications you need refilled. The day before your appointment, write a list of all prescriptions that need to be refilled. Bring that list to your appointment and give it to the admitting nurse. Prescriptions will be written only during appointments. If you forget a medication, it will not be "Called in", "Faxed", or "electronically sent". You will need to get another appointment to get these prescribed. Prescription Accuracy: You are responsible for carefully inspecting your prescriptions before leaving our office. Have the discharge nurse carefully go over each prescription with you, before taking them home. Make sure that your name is accurately spelled, that your address is correct. Check the name and dose of your medication to make sure it is accurate. Check the number of pills, and the written instructions to make sure they are clear and accurate. Make sure that you are given enough medication to last until your next medication refill appointment. Taking Medication: Take medication as prescribed. Never  take more pills than instructed. Never take medication more frequently than prescribed. Taking less pills or less frequently is permitted and encouraged, when it comes to controlled substances (written prescriptions).  Inform other Doctors: Always inform, all of your healthcare providers, of all the medications you take. Pain Medication from other Providers: You are not allowed to accept any additional pain medication from any other Doctor or Healthcare provider. There are two exceptions to this rule. (see below) In the event that you require additional pain medication, you are responsible for notifying us, as stated below. Medication Agreement: You are responsible for carefully reading and following our Medication Agreement. This must be signed before receiving any prescriptions from our practice. Safely store a copy of your signed Agreement. Violations to the Agreement will result in no further prescriptions. (Additional copies of our Medication Agreement are available upon request.) Laws, Rules, & Regulations: All patients are expected to follow all Federal and State Laws, Statutes, Rules, & Regulations. Ignorance of the Laws does not constitute a valid excuse. The use of any illegal substances is prohibited. Adopted CDC guidelines & recommendations: Target dosing levels will be at or below 60 MME/day. Use of benzodiazepines** is not recommended.  Exceptions: There are only two exceptions to the rule of not receiving pain medications from other Healthcare Providers. Exception #1 (Emergencies): In the event of an emergency (i.e.: accident requiring emergency care), you are allowed to receive additional pain medication. However, you are responsible for: As soon as you are able, call our office [phone], at any time of the day or night, and leave a message stating your name, the date and nature of the emergency, and the name and dose of the medication prescribed. In the event that your call is answered  by a member of   our staff, make sure to document and save the date, time, and the name of the person that took your information.  Exception #2 (Planned Surgery): In the event that you are scheduled by another doctor or dentist to have any type of surgery or procedure, you are allowed (for a period no longer than 30 days), to receive additional pain medication, for the acute post-op pain. However, in this case, you are responsible for picking up a copy of our "Post-op Pain Management for Surgeons" handout, and giving it to your surgeon or dentist. This document is available at our office, and does not require an appointment to obtain it. Simply go to our office during business hours (Monday-Thursday from 8:00 AM to 4:00 PM) (Friday 8:00 AM to 12:00 Noon) or if you have a scheduled appointment with us, prior to your surgery, and ask for it by name. In addition, you will need to provide us with your name, name of your surgeon, type of surgery, and date of procedure or surgery.  *Opioid medications include: morphine, codeine, oxycodone, oxymorphone, hydrocodone, hydromorphone, meperidine, tramadol, tapentadol, buprenorphine, fentanyl, methadone. **Benzodiazepine medications include: diazepam (Valium), alprazolam (Xanax), clonazepam (Klonopine), lorazepam (Ativan), clorazepate (Tranxene), chlordiazepoxide (Librium), estazolam (Prosom), oxazepam (Serax), temazepam (Restoril), triazolam (Halcion) (Last updated: 05/30/2017)  

## 2020-10-26 NOTE — Progress Notes (Deleted)
PROVIDER NOTE: Information contained herein reflects review and annotations entered in association with encounter. Interpretation of such information and data should be left to medically-trained personnel. Information provided to patient can be located elsewhere in the medical record under "Patient Instructions". Document created using STT-dictation technology, any transcriptional errors that may result from process are unintentional.    Patient: Thomas Cole  Service Category: E/M  Provider: Gillis Santa, MD  DOB: 28-Feb-1971  DOS: 10/26/2020  Specialty: Interventional Pain Management  MRN: [id number]  Setting: Ambulatory outpatient  PCP: Derinda Late, MD  Type: Established Patient    Referring Provider: Derinda Late, MD  Location: Office  Delivery: Face-to-face     HPI  Thomas Cole, a 50 y.o. year old male, is here today because of his Chronic pain syndrome [G89.4]. Thomas Cole primary complain today is Neck Pain Last encounter: My last encounter with him was on 09/14/2020. Pertinent problems: Thomas Cole has Spondylosis, cervical, with myelopathy; Migraines; History of seizures; Generalized anxiety disorder; and Bilateral occipital neuralgia on their pertinent problem list. Pain Assessment: Severity of Chronic pain is reported as a 8 /10. Location: Neck  /both arms to the hands, worse on the left. Onset: More than a month ago. Quality: Larence Penning. Timing: Constant. Modifying factor(s): medications. Vitals:  height is _0  (1.88 m) and weight is 313 lb (142 kg) (abnormal). His temporal temperature is 98 F (36.7 C). His blood pressure is 134/92 (abnormal) and his pulse is 91. His respiration is 16 and oxygen saturation is 97%.   Reason for encounter: medication management.      Pharmacotherapy Assessment   Analgesic: Hydrocodone extended release 10 mg twice daily, hydrocodone immediate release 10 mg 3 times daily as needed for breakthrough pain    Monitoring:  PMP: PDMP  reviewed during this encounter.       Pharmacotherapy: No side-effects or adverse reactions reported. Compliance: No problems identified. Effectiveness: Clinically acceptable.  Landis Martins, RN  10/26/2020  2:07 PM  Sign when Signing Visit Nursing Pain Medication Assessment:  Safety precautions to be maintained throughout the outpatient stay will include: orient to surroundings, keep bed in low position, maintain call bell within reach at all times, provide assistance with transfer out of bed and ambulation.  Medication Inspection Compliance: Pill count conducted under aseptic conditions, in front of the patient. Neither the pills nor the bottle was removed from the patient's sight at any time. Once count was completed pills were immediately returned to the patient in their original bottle.  Medication #1: Hydrocodone/APAP Pill/Patch Count:  81 of 90 pills remain Pill/Patch Appearance: Markings consistent with prescribed medication Bottle Appearance: Standard pharmacy container. Clearly labeled. Filled Date: 07 / 20 / 2022 Last Medication intake:  Today  Medication #2:  Hydrocodone Bitrartrate  Pill/Patch Count:  07 of 60 pills remain Pill/Patch Appearance: Markings consistent with prescribed medication Bottle Appearance: Standard pharmacy container. Clearly labeled. Filled Date: 06 / 22 /  2022 Last Medication intake:  Today   UDS:  Summary  Date Value Ref Range Status  10/27/2019 Note  Final    Comment:    ==================================================================== [facility] (MW) ==================================================================== Test                             Result       Flag       Units  Drug Present and Declared for Prescription Verification   7-aminoclonazepam  103          EXPECTED   ng/mg creat    7-aminoclonazepam is an expected metabolite of clonazepam. Source of    clonazepam is a scheduled prescription  medication.    Hydrocodone                    413          EXPECTED   ng/mg creat   Hydromorphone                  122          EXPECTED   ng/mg creat   Norhydrocodone                 2478         EXPECTED   ng/mg creat    Sources of hydrocodone include scheduled prescription medications.    Hydromorphone and norhydrocodone are expected metabolites of    hydrocodone. Hydromorphone is also available as a scheduled    prescription medication.  Drug Absent but Declared for Prescription Verification   Butalbital                     Not Detected UNEXPECTED ==================================================================== Test                      Result    Flag   Units      Ref Range   Creatinine              120              mg/dL      >=20 ==================================================================== Declared Medications:  The flagging and interpretation on this report are based on the  following declared medications.  Unexpected results may arise from  inaccuracies in the declared medications.   **Note: The testing scope of this panel includes these medications:   Butalbital (Fioricet)  Clonazepam (Klonopin)  Hydrocodone   **Note: The testing scope of this panel does not include the  following reported medications:   Acetaminophen (Fioricet)  Acetaminophen  Caffeine (Fioricet)  Carbidopa (Sinemet)  Cholecalciferol  Cyanocobalamin  Duloxetine (Cymbalta)  Hydrochlorothiazide  Levodopa (Sinemet)  Losartan (Cozaar)  Nortriptyline (Pamelor)  Omeprazole (Prilosec)  Ondansetron (Zofran)  Polyethylene Glycol (MiraLAX)  Pregabalin (Lyrica)  Propranolol (Inderal)  Sucralfate (Carafate)  Tizanidine (Zanaflex)  Valsartan (Diovan) ==================================================================== For clinical consultation, please call [phone]. ====================================================================      ROS  Constitutional: Denies any fever or  chills Gastrointestinal: No reported hemesis, hematochezia, vomiting, or acute GI distress Musculoskeletal:  cervicalgia Neurological: No reported episodes of acute onset apraxia, aphasia, dysarthria, agnosia, amnesia, paralysis, loss of coordination, or loss of consciousness  Medication Review  DULoxetine, HYDROcodone-acetaminophen, Vitamin D3, butalbital-acetaminophen-caffeine, carbidopa-levodopa, clonazePAM, entacapone, glipizide-metformin, hydrochlorothiazide, losartan, metFORMIN, nortriptyline, omeprazole, phenytoin, pregabalin, propranolol, sucralfate, tiZANidine, and vitamin B-12  History Review  Allergy: Thomas Cole is allergic to amitriptyline, bee venom, chlorhexidine, carbamazepine, meloxicam, morphine and related, and sulfa antibiotics. Drug: Thomas Cole  reports no history of drug use. Alcohol:  reports no history of alcohol use. Tobacco:  reports that he has never smoked. He has never used smokeless tobacco. Social: Thomas Cole  reports that he has never smoked. He has never used smokeless tobacco. He reports that he does not drink alcohol and does not use drugs. Medical:  has a past medical history of Anxiety, Asthma, Back problem, Benign essential hypertension (11/30/2016), Cervical spondylosis without myelopathy (8/00/3491), Complication of  anesthesia, Depression, Diabetes mellitus without complication ([place]) (27/0786), GERD (gastroesophageal reflux disease), Hypertension, Labile hypertension (03/19/2013), Major depressive disorder, recurrent episode, severe ([place]) (06/23/2015), Migraine headache, Migraines (11/11/2013), S/P appendectomy (04/09/2011), Seizure ([place]) (11/30/2016), Seizures ([place]), Shortness of breath, and SOB (shortness of breath) (08/06/2014). Surgical: Thomas Cole  has a past surgical history that includes Knee surgery; Posterior fusion cervical spine; Cervical disc surgery; Appendectomy (12); Anterior cervical corpectomy (N/A, 09/15/2012); Cholecystectomy (N/A, 03/29/2017);  Colonoscopy with propofol (N/A, 10/23/2019); and Esophagogastroduodenoscopy (egd) with propofol (N/A, 10/23/2019). Family: family history includes Arthritis in his father, maternal grandfather, maternal grandmother, mother, paternal grandfather, and paternal grandmother; Asthma in his mother; Coronary artery disease in his father and mother; Heart disease in his father, maternal grandfather, maternal grandmother, paternal grandfather, and paternal grandmother; Hypertension in his maternal grandfather, maternal grandmother, mother, paternal grandfather, and paternal grandmother; Mental illness in his mother.  Laboratory Chemistry Profile   Renal Lab Results  Component Value Date   BUN 13 04/21/2017   CREATININE 0.93 04/21/2017   LABCREA 79.3 03/05/2014   GFR 82.19 08/05/2014   GFRAA >60 04/21/2017   GFRNONAA >60 04/21/2017     Hepatic Lab Results  Component Value Date   AST 30 04/21/2017   ALT 23 04/21/2017   ALBUMIN 4.2 04/21/2017   ALKPHOS 86 04/21/2017   LIPASE 51 04/21/2017     Electrolytes Lab Results  Component Value Date   NA 138 04/21/2017   K 3.7 04/21/2017   CL 101 04/21/2017   CALCIUM 9.4 04/21/2017   MG 1.9 04/26/2020     Bone Lab Results  Component Value Date   VD25OH 31.95 03/05/2016   25OHVITD1 35 04/26/2020   25OHVITD2 <1.0 04/26/2020   25OHVITD3 35 04/26/2020     Inflammation (CRP: Acute Phase) (ESR: Chronic Phase) Lab Results  Component Value Date   CRP 10 04/26/2020   ESRSEDRATE 22 04/26/2020       Note: Above Lab results reviewed.  Recent Imaging Review  NM GASTRIC EMPTYING CLINICAL DATA:  Epigastric pain  EXAM: NUCLEAR MEDICINE GASTRIC EMPTYING SCAN  TECHNIQUE: After oral ingestion of radiolabeled meal, sequential abdominal images were obtained for 4 hours. Percentage of activity emptying the stomach was calculated at 1 hour, 2 hour, 3 hour, and 4 hours.  RADIOPHARMACEUTICALS:  2.57 mCi Tc-49msulfur colloid in standardized  meal  COMPARISON:  Ultrasound 04/21/2017  FINDINGS: Expected location of the stomach in the left upper quadrant. Ingested meal empties the stomach gradually over the course of the study.  49% emptied at 1 hr ( normal >= 10%)  67% emptied at 2 hr ( normal >= 40%)  81% emptied at 3 hr ( normal >= 70%)  100% emptied at 4 hr ( normal >= 90%)  IMPRESSION: Normal gastric emptying study.  Electronically Signed   By: KDonavan FoilM.D.   On: 08/24/2019 16:55 Note: Reviewed        Physical Exam  General appearance: Well nourished, well developed, and well hydrated. In no apparent acute distress Mental status: Alert, oriented x 3 (person, place, & time)       Respiratory: No evidence of acute respiratory distress Eyes: PERLA Vitals: BP (!) 134/92   Pulse 91   Temp 98 F (36.7 C) (Temporal)   Resp 16   Ht _0  (1.88 m)   Wt (!) 313 lb (142 kg)   SpO2 97%   BMI 40.19 kg/m  BMI: Estimated body mass index is 40.19 kg/m as calculated from the following:  Height as of this encounter: _0  (1.88 m).   Weight as of this encounter: 313 lb (142 kg). Ideal: Ideal body weight: 82.2 kg (181 lb 3.5 oz) Adjusted ideal body weight: 106.1 kg (233 lb 14.9 oz)  Cervical Spine Area Exam  Skin & Axial Inspection: Well healed scar from previous spine surgery detected Alignment: Symmetrical Functional ROM: Pain restricted ROM, bilaterally Stability: No instability detected Muscle Tone/Strength: Functionally intact. No obvious neuro-muscular anomalies detected. Sensory (Neurological): Dermatomal pain pattern and neurogenic Palpation: No palpable anomalies               Upper Extremity (UE) Exam      Side: Right upper extremity   Side: Left upper extremity  Skin & Extremity Inspection: Skin color, temperature, and hair growth are WNL. No peripheral edema or cyanosis. No masses, redness, swelling, asymmetry, or associated skin lesions. No contractures.   Skin & Extremity Inspection: Skin  color, temperature, and hair growth are WNL. No peripheral edema or cyanosis. No masses, redness, swelling, asymmetry, or associated skin lesions. No contractures.  Functional ROM: Pain restricted ROM for shoulder and elbow   Functional ROM: Pain restricted ROM for shoulder and elbow  Muscle Tone/Strength: Functionally intact. No obvious neuro-muscular anomalies detected.   Muscle Tone/Strength: Functionally intact. No obvious neuro-muscular anomalies detected.  Sensory (Neurological): Unimpaired           Sensory (Neurological): Unimpaired          Palpation: No palpable anomalies               Palpation: No palpable anomalies              Provocative Test(s):  Phalen's test: deferred Tinel's test: deferred Apley's scratch test (touch opposite shoulder):  Action 1 (Across chest): Decreased ROM Action 2 (Overhead): Decreased ROM Action 3 (LB reach): Decreased ROM       Provocative Test(s):  Phalen's test: deferred Tinel's test: deferred Apley's scratch test (touch opposite shoulder):  Action 1 (Across chest): Decreased ROM Action 2 (Overhead): Decreased ROM Action 3 (LB reach): Decreased ROM    5 out of 5 strength bilateral upper extremity: Shoulder abduction, elbow flexion, elbow extension, thumb extension.     Patient presents today in wheelchair.     Assessment   Status Diagnosis  Persistent Persistent Persistent 1. Chronic pain syndrome   2. Myelopathy of cervical spinal cord with cervical radiculopathy (HCC)   3. Thoracic radiculopathy   4. Failed cervical fusion   5. Bilateral occipital neuralgia   6. Spondylosis, cervical, with myelopathy       Plan of Care  Thomas Cole has a current medication list which includes the following long-term medication(s): carbidopa-levodopa, carbidopa-levodopa, clonazepam, glipizide-metformin, metformin, nortriptyline, propranolol, sucralfate, hydrocodone-acetaminophen, hydrocodone-acetaminophen, hydrocodone-acetaminophen, and  pregabalin.  Pharmacotherapy (Medications Ordered): No orders of the defined types were placed in this encounter.  Orders:  No orders of the defined types were placed in this encounter.  Follow-up plan:   No follow-ups on file.     Diagnostic cervical facet medial branch nerve blocks C3-C7 b/l 05/06/19- did not help Diagnostic thoracic facet medial branch nerve blocks T1-T2, consider bilateral occipital nerve block  Cervical/trapezius trigger point injections SPG block                 Recent Visits Date Type Provider Dept  09/14/20 Telemedicine Gillis Santa, MD [facility]  08/15/20 Procedure visit Gillis Santa, MD [facility]  Showing recent visits within past  90 days and meeting all other requirements Today's Visits Date Type Provider Dept  10/26/20 Office Visit Gillis Santa, MD [facility]  Showing today's visits and meeting all other requirements Future Appointments No visits were found meeting these conditions. Showing future appointments within next 90 days and meeting all other requirements I discussed the assessment and treatment plan with the patient. The patient was provided an opportunity to ask questions and all were answered. The patient agreed with the plan and demonstrated an understanding of the instructions.  Patient advised to call back or seek an in-person evaluation if the symptoms or condition worsens.  Duration of encounter: 30 minutes.  Note by: Gillis Santa, MD Date: 10/26/2020; Time: 2:12 PM

## 2020-10-26 NOTE — Progress Notes (Signed)
PROVIDER NOTE: Information contained herein reflects review and annotations entered in association with encounter. Interpretation of such information and data should be left to medically-trained personnel. Information provided to patient can be located elsewhere in the medical record under "Patient Instructions". Document created using STT-dictation technology, any transcriptional errors that may result from process are unintentional.    Patient: Thomas Cole  Service Category: E/M  Provider: Gillis Santa, MD  DOB: February 19, 1971  DOS: 10/26/2020  Specialty: Interventional Pain Management  MRN: [id number]  Setting: Ambulatory outpatient  PCP: Derinda Late, MD  Type: Established Patient    Referring Provider: Derinda Late, MD  Location: Office  Delivery: Face-to-face     HPI  Thomas Cole, a 50 y.o. year old male, is here today because of his Chronic pain syndrome [G89.4]. Thomas Cole primary complain today is Neck Pain Last encounter: My last encounter with him was on 09/14/2020. Pertinent problems: Thomas Cole has Spondylosis, cervical, with myelopathy; Migraines; History of seizures; Generalized anxiety disorder; and Bilateral occipital neuralgia on their pertinent problem list. Pain Assessment: Severity of Chronic pain is reported as a 8 /10. Location: Neck  /both arms to the hands, worse on the left. Onset: More than a month ago. Quality: Larence Penning. Timing: Constant. Modifying factor(s): medications. Vitals:  height is 6' 2"  (1.88 m) and weight is 313 lb (142 kg) (abnormal). His temporal temperature is 98 F (36.7 C). His blood pressure is 134/92 (abnormal) and his pulse is 91. His respiration is 16 and oxygen saturation is 97%.   Reason for encounter: medication management.    Increased left hip pain, worse at night  Having trouble sleeping at night due to pain Increased neck pain Will obtain xrays of bilateral hips and bilateral SI-J Slight increase in IR Hydrocodone frmo q6 hrs to  q4 hrs prn (#105/month)   Pharmacotherapy Assessment  Analgesic: Hydrocodone extended release 10 mg twice daily, hydrocodone immediate release 10 mg 3 times daily as needed for breakthrough pain    Monitoring:Hydrocodone extended release 10 mg twice daily, hydrocodone immediate release 10 mg 3 times daily as needed for breakthrough pain   [place] PMP: PDMP reviewed during this encounter.       Pharmacotherapy: No side-effects or adverse reactions reported. Compliance: No problems identified. Effectiveness: Clinically acceptable.  UDS:  Summary  Date Value Ref Range Status  10/27/2019 Note  Final    Comment:    ==================================================================== [employer] Select 13 (MW) ==================================================================== Test                             Result       Flag       Units  Drug Present and Declared for Prescription Verification   7-aminoclonazepam              103          EXPECTED   ng/mg creat    7-aminoclonazepam is an expected metabolite of clonazepam. Source of    clonazepam is a scheduled prescription medication.    Hydrocodone                    413          EXPECTED   ng/mg creat   Hydromorphone                  122          EXPECTED   ng/mg creat  Norhydrocodone                 2478         EXPECTED   ng/mg creat    Sources of hydrocodone include scheduled prescription medications.    Hydromorphone and norhydrocodone are expected metabolites of    hydrocodone. Hydromorphone is also available as a scheduled    prescription medication.  Drug Absent but Declared for Prescription Verification   Butalbital                     Not Detected UNEXPECTED ==================================================================== Test                      Result    Flag   Units      Ref Range   Creatinine              120              mg/dL      >=20 ==================================================================== Declared  Medications:  The flagging and interpretation on this report are based on the  following declared medications.  Unexpected results may arise from  inaccuracies in the declared medications.   **Note: The testing scope of this panel includes these medications:   Butalbital (Fioricet)  Clonazepam (Klonopin)  Hydrocodone   **Note: The testing scope of this panel does not include the  following reported medications:   Acetaminophen (Fioricet)  Acetaminophen  Caffeine (Fioricet)  Carbidopa (Sinemet)  Cholecalciferol  Cyanocobalamin  Duloxetine (Cymbalta)  Hydrochlorothiazide  Levodopa (Sinemet)  Losartan (Cozaar)  Nortriptyline (Pamelor)  Omeprazole (Prilosec)  Ondansetron (Zofran)  Polyethylene Glycol (MiraLAX)  Pregabalin (Lyrica)  Propranolol (Inderal)  Sucralfate (Carafate)  Tizanidine (Zanaflex)  Valsartan (Diovan) ==================================================================== For clinical consultation, please call [phone]. ====================================================================       ROS  Constitutional: Denies any fever or chills Gastrointestinal: No reported hemesis, hematochezia, vomiting, or acute GI distress Musculoskeletal:  cervicalgia  L>right hip pain Neurological: No reported episodes of acute onset apraxia, aphasia, dysarthria, agnosia, amnesia, paralysis, loss of coordination, or loss of consciousness  Medication Review  DULoxetine, HYDROcodone Bitartrate ER, HYDROcodone-acetaminophen, Vitamin D3, butalbital-acetaminophen-caffeine, carbidopa-levodopa, clonazePAM, entacapone, glipizide-metformin, hydrochlorothiazide, losartan, metFORMIN, nortriptyline, omeprazole, phenytoin, pregabalin, propranolol, sucralfate, tiZANidine, and vitamin B-12  History Review  Allergy: Thomas Cole is allergic to amitriptyline, bee venom, chlorhexidine, carbamazepine, meloxicam, morphine and related, and sulfa antibiotics. Drug: Thomas Cole  reports no  history of drug use. Alcohol:  reports no history of alcohol use. Tobacco:  reports that he has never smoked. He has never used smokeless tobacco. Social: Thomas Cole  reports that he has never smoked. He has never used smokeless tobacco. He reports that he does not drink alcohol and does not use drugs. Medical:  has a past medical history of Anxiety, Asthma, Back problem, Benign essential hypertension (11/30/2016), Cervical spondylosis without myelopathy (6/65/9935), Complication of anesthesia, Depression, Diabetes mellitus without complication ([place]) (70/1779), GERD (gastroesophageal reflux disease), Hypertension, Labile hypertension (03/19/2013), Major depressive disorder, recurrent episode, severe ([place]) (06/23/2015), Migraine headache, Migraines (11/11/2013), S/P appendectomy (04/09/2011), Seizure ([place]) (11/30/2016), Seizures ([place]), Shortness of breath, and SOB (shortness of breath) (08/06/2014). Surgical: Thomas Cole  has a past surgical history that includes Knee surgery; Posterior fusion cervical spine; Cervical disc surgery; Appendectomy (12); Anterior cervical corpectomy (N/A, 09/15/2012); Cholecystectomy (N/A, 03/29/2017); Colonoscopy with propofol (N/A, 10/23/2019); and Esophagogastroduodenoscopy (egd) with propofol (N/A, 10/23/2019). Family: family history includes Arthritis in his father, maternal grandfather, maternal grandmother, mother, paternal grandfather, and  paternal grandmother; Asthma in his mother; Coronary artery disease in his father and mother; Heart disease in his father, maternal grandfather, maternal grandmother, paternal grandfather, and paternal grandmother; Hypertension in his maternal grandfather, maternal grandmother, mother, paternal grandfather, and paternal grandmother; Mental illness in his mother.  Laboratory Chemistry Profile   Renal Lab Results  Component Value Date   BUN 13 04/21/2017   CREATININE 0.93 04/21/2017   LABCREA 79.3 03/05/2014   GFR 82.19 08/05/2014   GFRAA  >60 04/21/2017   GFRNONAA >60 04/21/2017     Hepatic Lab Results  Component Value Date   AST 30 04/21/2017   ALT 23 04/21/2017   ALBUMIN 4.2 04/21/2017   ALKPHOS 86 04/21/2017   LIPASE 51 04/21/2017     Electrolytes Lab Results  Component Value Date   NA 138 04/21/2017   K 3.7 04/21/2017   CL 101 04/21/2017   CALCIUM 9.4 04/21/2017   MG 1.9 04/26/2020     Bone Lab Results  Component Value Date   VD25OH 31.95 03/05/2016   25OHVITD1 35 04/26/2020   25OHVITD2 <1.0 04/26/2020   25OHVITD3 35 04/26/2020     Inflammation (CRP: Acute Phase) (ESR: Chronic Phase) Lab Results  Component Value Date   CRP 10 04/26/2020   ESRSEDRATE 22 04/26/2020       Note: Above Lab results reviewed.  Recent Imaging Review  NM GASTRIC EMPTYING CLINICAL DATA:  Epigastric pain  EXAM: NUCLEAR MEDICINE GASTRIC EMPTYING SCAN  TECHNIQUE: After oral ingestion of radiolabeled meal, sequential abdominal images were obtained for 4 hours. Percentage of activity emptying the stomach was calculated at 1 hour, 2 hour, 3 hour, and 4 hours.  RADIOPHARMACEUTICALS:  2.57 mCi Tc-101msulfur colloid in standardized meal  COMPARISON:  Ultrasound 04/21/2017  FINDINGS: Expected location of the stomach in the left upper quadrant. Ingested meal empties the stomach gradually over the course of the study.  49% emptied at 1 hr ( normal >= 10%)  67% emptied at 2 hr ( normal >= 40%)  81% emptied at 3 hr ( normal >= 70%)  100% emptied at 4 hr ( normal >= 90%)  IMPRESSION: Normal gastric emptying study.  Electronically Signed   By: KDonavan FoilM.D.   On: 08/24/2019 16:55 Note: Reviewed        Physical Exam  General appearance: Well nourished, well developed, and well hydrated. In no apparent acute distress Mental status: Alert, oriented x 3 (person, place, & time)       Respiratory: No evidence of acute respiratory distress Eyes: PERLA Vitals: BP (!) 134/92   Pulse 91   Temp 98 F (36.7 C)  (Temporal)   Resp 16   Ht 6' 2"  (1.88 m)   Wt (!) 313 lb (142 kg)   SpO2 97%   BMI 40.19 kg/m  BMI: Estimated body mass index is 40.19 kg/m as calculated from the following:   Height as of this encounter: 6' 2"  (1.88 m).   Weight as of this encounter: 313 lb (142 kg). Ideal: Ideal body weight: 82.2 kg (181 lb 3.5 oz) Adjusted ideal body weight: 106.1 kg (233 lb 14.9 oz)  Cervical Spine Area Exam  Skin & Axial Inspection: Well healed scar from previous spine surgery detected Alignment: Symmetrical Functional ROM: Pain restricted ROM, bilaterally Stability: No instability detected Muscle Tone/Strength: Functionally intact. No obvious neuro-muscular anomalies detected. Sensory (Neurological): Dermatomal pain pattern and neurogenic Palpation: No palpable anomalies  Upper Extremity (UE) Exam      Side: Right upper extremity   Side: Left upper extremity  Skin & Extremity Inspection: Skin color, temperature, and hair growth are WNL. No peripheral edema or cyanosis. No masses, redness, swelling, asymmetry, or associated skin lesions. No contractures.   Skin & Extremity Inspection: Skin color, temperature, and hair growth are WNL. No peripheral edema or cyanosis. No masses, redness, swelling, asymmetry, or associated skin lesions. No contractures.  Functional ROM: Pain restricted ROM for shoulder and elbow   Functional ROM: Pain restricted ROM for shoulder and elbow  Muscle Tone/Strength: Functionally intact. No obvious neuro-muscular anomalies detected.   Muscle Tone/Strength: Functionally intact. No obvious neuro-muscular anomalies detected.  Sensory (Neurological): Unimpaired           Sensory (Neurological): Unimpaired          Palpation: No palpable anomalies               Palpation: No palpable anomalies              Provocative Test(s):  Phalen's test: deferred Tinel's test: deferred Apley's scratch test (touch opposite shoulder):  Action 1 (Across chest): Decreased  ROM Action 2 (Overhead): Decreased ROM Action 3 (LB reach): Decreased ROM       Provocative Test(s):  Phalen's test: deferred Tinel's test: deferred Apley's scratch test (touch opposite shoulder):  Action 1 (Across chest): Decreased ROM Action 2 (Overhead): Decreased ROM Action 3 (LB reach): Decreased ROM     Left hip pain Pain with hip flexion   Assessment   Status Diagnosis  Persistent Persistent Persistent 1. Chronic pain syndrome   2. Myelopathy of cervical spinal cord with cervical radiculopathy (HCC)   3. Thoracic radiculopathy   4. Failed cervical fusion   5. Bilateral occipital neuralgia   6. Spondylosis, cervical, with myelopathy   7. Bilateral hip pain   8. Sacroiliac joint pain       Plan of Care  Thomas Cole has a current medication list which includes the following long-term medication(s): carbidopa-levodopa, carbidopa-levodopa, clonazepam, glipizide-metformin, metformin, propranolol, sucralfate, duloxetine, [START ON 11/14/2020] hydrocodone-acetaminophen, [START ON 12/14/2020] hydrocodone-acetaminophen, [START ON 01/13/2021] hydrocodone-acetaminophen, nortriptyline, and pregabalin.  Pharmacotherapy (Medications Ordered): Meds ordered this encounter  Medications   HYDROcodone Bitartrate ER 10 MG CP12    Sig: Take 10 mg by mouth every 12 (twelve) hours. Must last 30 days.    Dispense:  60 capsule    Refill:  0    Chronic Pain: STOP Act (Not applicable) Fill 1 day early if closed on refill date. Avoid benzodiazepines within 8 hours of opioids   HYDROcodone Bitartrate ER 10 MG CP12    Sig: Take 10 mg by mouth every 12 (twelve) hours. Must last 30 days.    Dispense:  60 capsule    Refill:  0    Chronic Pain: STOP Act (Not applicable) Fill 1 day early if closed on refill date. Avoid benzodiazepines within 8 hours of opioids   HYDROcodone Bitartrate ER 10 MG CP12    Sig: Take 10 mg by mouth every 12 (twelve) hours. Must last 30 days.    Dispense:  60  capsule    Refill:  0    Chronic Pain: STOP Act (Not applicable) Fill 1 day early if closed on refill date. Avoid benzodiazepines within 8 hours of opioids   HYDROcodone-acetaminophen (NORCO) 10-325 MG tablet    Sig: Take 1 tablet by mouth every 6 (six) hours as needed. For chronic  pain syndrome. Each Rx to last 30 days.    Dispense:  105 tablet    Refill:  0   HYDROcodone-acetaminophen (NORCO) 10-325 MG tablet    Sig: Take 1 tablet by mouth every 6 (six) hours as needed. For chronic pain syndrome. Each Rx to last 30 days.    Dispense:  105 tablet    Refill:  0   HYDROcodone-acetaminophen (NORCO) 10-325 MG tablet    Sig: Take 1 tablet by mouth every 6 (six) hours as needed. For chronic pain syndrome. Each Rx to last 30 days.    Dispense:  105 tablet    Refill:  0   pregabalin (LYRICA) 100 MG capsule    Sig: Take 1 capsule (100 mg total) by mouth 3 (three) times daily.    Dispense:  90 capsule    Refill:  5    Do not place this medication, or any other prescription from our practice, on "Automatic Refill". Patient may have prescription filled one day early if pharmacy is closed on scheduled refill date.   tiZANidine (ZANAFLEX) 4 MG tablet    Sig: Take 1 tablet (4 mg total) by mouth every 8 (eight) hours as needed for muscle spasms.    Dispense:  90 tablet    Refill:  11   nortriptyline (PAMELOR) 25 MG capsule    Sig: Take 2 capsules (50 mg total) by mouth at bedtime.    Dispense:  60 capsule    Refill:  5   DULoxetine (CYMBALTA) 60 MG capsule    Sig: Take 1 capsule (60 mg total) by mouth daily.    Dispense:  30 capsule    Refill:  5    Orders:  Orders Placed This Encounter  Procedures   DG HIP UNILAT W OR W/O PELVIS 2-3 VIEWS LEFT    Please describe any evidence of DJD, such as joint narrowing, asymmetry, cysts, or any anomalies in bone density, production, or erosion.    Standing Status:   Future    Standing Expiration Date:   11/26/2020    Scheduling Instructions:      Imaging must be done as soon as possible. Inform patient that order will expire within 30 days and I will not renew it.    Order Specific Question:   Reason for Exam (SYMPTOM  OR DIAGNOSIS REQUIRED)    Answer:   Left hip pain/arthralgia    Order Specific Question:   Preferred imaging location?    Answer:   [facility]    Order Specific Question:   Call Results- Best Contact Number?    Answer:   [phone] ([facility])   DG HIP UNILAT W OR W/O PELVIS 2-3 VIEWS RIGHT    Please describe any evidence of DJD, such as joint narrowing, asymmetry, cysts, or any anomalies in bone density, production, or erosion.    Standing Status:   Future    Standing Expiration Date:   11/26/2020    Scheduling Instructions:     Imaging must be done as soon as possible. Inform patient that order will expire within 30 days and I will not renew it.    Order Specific Question:   Reason for Exam (SYMPTOM  OR DIAGNOSIS REQUIRED)    Answer:   Left hip pain/arthralgia    Order Specific Question:   Preferred imaging location?    Answer:   [facility]    Order Specific Question:   Call Results- Best Contact Number?    Answer:   (  [phone] ([facility])    Order Specific Question:   Release to patient    Answer:   Immediate   DG Si Joints    Standing Status:   Future    Standing Expiration Date:   10/26/2021    Order Specific Question:   Reason for Exam (SYMPTOM  OR DIAGNOSIS REQUIRED)    Answer:   bilateral buttock  pain    Order Specific Question:   Preferred imaging location?    Answer:   [facility]   Tox[employer]SU[employer] Select 13 (MW), Urine    Volume: 30 ml(s). Minimum 3 ml of urine is needed. Document temperature of fresh sample. Indications: Long term (current) use of opiate analgesic [phone])    Order Specific Question:   Release to patient    Answer:   Immediate    Follow-up plan:   Return in about 3 months (around 01/26/2021) for Medication Management, in person.      Diagnostic cervical facet medial branch nerve blocks C3-C7 b/l 05/06/19- did not help Diagnostic thoracic facet medial branch nerve blocks T1-T2, consider bilateral occipital nerve block  Cervical/trapezius trigger point injections SPG block                 Recent Visits Date Type Provider Dept  09/14/20 Telemedicine Gillis Santa, MD [facility]  08/15/20 Procedure visit Gillis Santa, MD [facility]  Showing recent visits within past 90 days and meeting all other requirements Today's Visits Date Type Provider Dept  10/26/20 Office Visit Gillis Santa, MD [facility]  Showing today's visits and meeting all other requirements Future Appointments No visits were found meeting these conditions. Showing future appointments within next 90 days and meeting all other requirements I discussed the assessment and treatment plan with the patient. The patient was provided an opportunity to ask questions and all were answered. The patient agreed with the plan and demonstrated an understanding of the instructions.  Patient advised to call back or seek an in-person evaluation if the symptoms or condition worsens.  Duration of encounter: 30 minutes.  Note by: Gillis Santa, MD Date: 10/26/2020; Time: 2:44 PM

## 2020-10-28 LAB — TOXASSURE SELECT 13 (MW), URINE

## 2020-10-31 ENCOUNTER — Telehealth: Payer: Self-pay | Admitting: *Deleted

## 2020-10-31 NOTE — Telephone Encounter (Signed)
-----   Message from Gillis Santa, MD sent at 10/31/2020  1:29 PM EDT ----- Regarding: Please call patient with x-ray results X-rays of hips and SI joints normal. ----- Message ----- From: Interface, Rad Results In Sent: 10/29/2020  11:55 AM EDT To: Gillis Santa, MD

## 2020-10-31 NOTE — Telephone Encounter (Signed)
Patient notified of x-ray results.

## 2020-11-04 ENCOUNTER — Telehealth: Payer: Self-pay | Admitting: Student in an Organized Health Care Education/Training Program

## 2020-11-04 NOTE — Telephone Encounter (Signed)
Prior Auth for Hydrocodone has been approved for August 22 thru Aug 23.

## 2020-11-04 NOTE — Telephone Encounter (Signed)
Attempted to reach.  Call would not go through.  Will try again. LP

## 2021-01-13 ENCOUNTER — Telehealth: Payer: Self-pay

## 2021-01-13 NOTE — Telephone Encounter (Signed)
Spoke with patient and scheduled an in-person Palliative Consult for 01/23/21 @ 2:00 PM.  COVID screening was negative. One cat in the home. Patient lives alone.  Consent obtained; updated Outlook/Netsmart/Team List and Epic.   Patient is aware he may be receiving a call from provider the day before or day of to confirm appointment.

## 2021-01-19 ENCOUNTER — Other Ambulatory Visit: Payer: Self-pay

## 2021-01-19 ENCOUNTER — Encounter: Payer: Self-pay | Admitting: Student in an Organized Health Care Education/Training Program

## 2021-01-19 ENCOUNTER — Ambulatory Visit
Payer: Medicare Other | Attending: Student in an Organized Health Care Education/Training Program | Admitting: Student in an Organized Health Care Education/Training Program

## 2021-01-19 VITALS — BP 117/71 | HR 81 | Temp 96.9°F | Resp 15 | Ht 74.0 in | Wt 317.0 lb

## 2021-01-19 DIAGNOSIS — M960 Pseudarthrosis after fusion or arthrodesis: Secondary | ICD-10-CM | POA: Diagnosis not present

## 2021-01-19 DIAGNOSIS — G894 Chronic pain syndrome: Secondary | ICD-10-CM | POA: Diagnosis present

## 2021-01-19 DIAGNOSIS — M4712 Other spondylosis with myelopathy, cervical region: Secondary | ICD-10-CM

## 2021-01-19 DIAGNOSIS — G959 Disease of spinal cord, unspecified: Secondary | ICD-10-CM | POA: Diagnosis not present

## 2021-01-19 DIAGNOSIS — M5412 Radiculopathy, cervical region: Secondary | ICD-10-CM | POA: Diagnosis present

## 2021-01-19 DIAGNOSIS — R1032 Left lower quadrant pain: Secondary | ICD-10-CM | POA: Diagnosis present

## 2021-01-19 DIAGNOSIS — M5414 Radiculopathy, thoracic region: Secondary | ICD-10-CM | POA: Diagnosis not present

## 2021-01-19 DIAGNOSIS — M25551 Pain in right hip: Secondary | ICD-10-CM

## 2021-01-19 DIAGNOSIS — M25552 Pain in left hip: Secondary | ICD-10-CM

## 2021-01-19 DIAGNOSIS — M5481 Occipital neuralgia: Secondary | ICD-10-CM | POA: Diagnosis not present

## 2021-01-19 MED ORDER — DULOXETINE HCL 30 MG PO CPEP: 30.0000 mg | ORAL_CAPSULE | Freq: Two times a day (BID) | ORAL | 5 refills | Status: DC

## 2021-01-19 MED ORDER — PREGABALIN 100 MG PO CAPS: 100.0000 mg | ORAL_CAPSULE | Freq: Three times a day (TID) | ORAL | 5 refills | Status: DC

## 2021-01-19 MED ORDER — HYDROCODONE-ACETAMINOPHEN 10-325 MG PO TABS: 1.0000 | ORAL_TABLET | Freq: Four times a day (QID) | ORAL | 0 refills | Status: DC | PRN

## 2021-01-19 MED ORDER — NORTRIPTYLINE HCL 25 MG PO CAPS: 50.0000 mg | ORAL_CAPSULE | Freq: Every day | ORAL | 5 refills | Status: DC

## 2021-01-19 MED ORDER — HYDROCODONE BITARTRATE ER 10 MG PO CP12: 10.0000 mg | ORAL_CAPSULE | Freq: Two times a day (BID) | ORAL | 0 refills | Status: AC

## 2021-01-19 MED ORDER — TIZANIDINE HCL 4 MG PO TABS: 4.0000 mg | ORAL_TABLET | Freq: Three times a day (TID) | ORAL | 11 refills | Status: DC | PRN

## 2021-01-19 MED ORDER — HYDROCODONE BITARTRATE ER 10 MG PO CP12: 10.0000 mg | ORAL_CAPSULE | Freq: Two times a day (BID) | ORAL | 0 refills | Status: DC

## 2021-01-19 NOTE — Progress Notes (Signed)
Nursing Pain Medication Assessment:  Safety precautions to be maintained throughout the outpatient stay will include: orient to surroundings, keep bed in low position, maintain call bell within reach at all times, provide assistance with transfer out of bed and ambulation.  Medication Inspection Compliance: Pill count conducted under aseptic conditions, in front of the patient. Neither the pills nor the bottle was removed from the patient's sight at any time. Once count was completed pills were immediately returned to the patient in their original bottle.  Medication: Hydrocodone/APAP Pill/Patch Count:  35 of 105 pills remain Pill/Patch Appearance: Markings consistent with prescribed medication Bottle Appearance: Standard pharmacy container. Clearly labeled. Filled Date: 09 / 14 / 2022 Last Medication intake:  Yesterday  Did not bring extended release pill bottle today.

## 2021-01-19 NOTE — Progress Notes (Signed)
PROVIDER NOTE: Information contained herein reflects review and annotations entered in association with encounter. Interpretation of such information and data should be left to medically-trained personnel. Information provided to patient can be located elsewhere in the medical record under "Patient Instructions". Document created using STT-dictation technology, any transcriptional errors that may result from process are unintentional.    Patient: Thomas Cole  Service Category: E/M  Provider: Gillis Santa, MD  DOB: May 24, 1970  DOS: 01/19/2021  Specialty: Interventional Pain Management  MRN: [id number]  Setting: Ambulatory outpatient  PCP: Derinda Late, MD  Type: Established Patient    Referring Provider: Derinda Late, MD  Location: Office  Delivery: Face-to-face     HPI  Mr. DOYAL SARIC, a 50 y.o. year old male, is here today because of his Myelopathy of cervical spinal cord with cervical radiculopathy ([place]) [G95.9, M54.12]. Mr. Gell primary complain today is Neck Pain and Back Pain (lower) Last encounter: My last encounter with him was on 10/26/20 Pertinent problems: Mr. Belt has Spondylosis, cervical, with myelopathy; Migraines; History of seizures; Generalized anxiety disorder; and Bilateral occipital neuralgia on their pertinent problem list. Pain Assessment: Severity of Chronic pain is reported as a 8 /10. Location: Neck Posterior/ . Onset:  . Quality:  . Timing:  . Modifying factor(s): meds. Vitals:  height is _0  (1.88 m) and weight is 317 lb (143.8 kg) (abnormal). His temporal temperature is 96.9 F (36.1 C) (abnormal). His blood pressure is 117/71 and his pulse is 81. His respiration is 15 and oxygen saturation is 98%.   Reason for encounter: medication management.    Patient presents today for medication management.  No significant change in his medical history.  Continues to have left lower quadrant abdominal pain that is tender to palpation.  Has had a full work-up with GI  which was unremarkable.  Discussed left trigger point injection under ultrasound guidance in that region.  Otherwise continue chronic pain medications as below.  No change in dose.   Pharmacotherapy Assessment  Analgesic: Hydrocodone extended release 10 mg twice daily, hydrocodone immediate release 10 mg 3 times daily as needed for breakthrough pain    Monitoring:Hydrocodone extended release 10 mg twice daily, hydrocodone immediate release 10 mg 3 times daily as needed for breakthrough pain   [place] PMP: PDMP not reviewed this encounter.       Pharmacotherapy: No side-effects or adverse reactions reported. Compliance: No problems identified. Effectiveness: Clinically acceptable.  UDS:  Summary  Date Value Ref Range Status  10/26/2020 Note  Final    Comment:    ==================================================================== [employer] (MW) ==================================================================== Test                             Result       Flag       Units  Drug Present and Declared for Prescription Verification   Hydrocodone                    349          EXPECTED   ng/mg creat   Hydromorphone                  95           EXPECTED   ng/mg creat   Dihydrocodeine                 108  EXPECTED   ng/mg creat   Norhydrocodone                 1578         EXPECTED   ng/mg creat    Sources of hydrocodone include scheduled prescription medications.    Hydromorphone, dihydrocodeine and norhydrocodone are expected    metabolites of hydrocodone. Hydromorphone and dihydrocodeine are    also available as scheduled prescription medications.  Drug Absent but Declared for Prescription Verification   Clonazepam                     Not Detected UNEXPECTED ng/mg creat   Butalbital                     Not Detected UNEXPECTED ==================================================================== Test                      Result    Flag   Units      Ref Range    Creatinine              65               mg/dL      >=20 ==================================================================== Declared Medications:  The flagging and interpretation on this report are based on the  following declared medications.  Unexpected results may arise from  inaccuracies in the declared medications.   **Note: The testing scope of this panel includes these medications:   Butalbital (Fioricet)  Clonazepam (Klonopin)  Hydrocodone  Hydrocodone (Norco)   **Note: The testing scope of this panel does not include the  following reported medications:   Acetaminophen (Fioricet)  Acetaminophen (Norco)  Caffeine (Fioricet)  Carbidopa (Sinemet)  Cyanocobalamin  Duloxetine (Cymbalta)  Entacapone  Glipizide  Hydrochlorothiazide (Hydrodiuril)  Levodopa (Sinemet)  Losartan (Cozaar)  Metformin (Glucophage)  Nortriptyline (Pamelor)  Omeprazole (Prilosec)  Phenytoin (Dilantin)  Pregabalin (Lyrica)  Propranolol (Inderal)  Sucralfate (Carafate)  Tizanidine (Zanaflex)  Vitamin D3 ==================================================================== For clinical consultation, please call [phone]. ====================================================================       ROS  Constitutional: Denies any fever or chills Gastrointestinal: No reported hemesis, hematochezia, vomiting, or acute GI distress Musculoskeletal:  cervicalgia  L>right hip pain; left abdominal pain likely musculoskeletal, negative GI work-up Neurological: No reported episodes of acute onset apraxia, aphasia, dysarthria, agnosia, amnesia, paralysis, loss of coordination, or loss of consciousness  Medication Review  DULoxetine, HYDROcodone Bitartrate ER, HYDROcodone-acetaminophen, Vitamin D3, butalbital-acetaminophen-caffeine, carbidopa-levodopa, clonazePAM, entacapone, glipizide-metformin, hydrochlorothiazide, losartan, metFORMIN, nortriptyline, omeprazole, phenytoin, pregabalin, propranolol,  sucralfate, tiZANidine, and vitamin B-12  History Review  Allergy: Mr. Fluke is allergic to amitriptyline, bee venom, chlorhexidine, carbamazepine, meloxicam, morphine and related, and sulfa antibiotics. Drug: Mr. Statzer  reports no history of drug use. Alcohol:  reports no history of alcohol use. Tobacco:  reports that he has never smoked. He has never used smokeless tobacco. Social: Mr. Zielinski  reports that he has never smoked. He has never used smokeless tobacco. He reports that he does not drink alcohol and does not use drugs. Medical:  has a past medical history of Anxiety, Asthma, Back problem, Benign essential hypertension (11/30/2016), Cervical spondylosis without myelopathy (9/48/5462), Complication of anesthesia, Depression, Diabetes mellitus without complication ([place]) (70/3500), GERD (gastroesophageal reflux disease), Hypertension, Labile hypertension (03/19/2013), Major depressive disorder, recurrent episode, severe ([place]) (06/23/2015), Migraine headache, Migraines (11/11/2013), S/P appendectomy (04/09/2011), Seizure ([place]) (11/30/2016), Seizures ([place]), Shortness of breath, and SOB (shortness of breath) (08/06/2014). Surgical: Mr. Stanbery  has a  past surgical history that includes Knee surgery; Posterior fusion cervical spine; Cervical disc surgery; Appendectomy (12); Anterior cervical corpectomy (N/A, 09/15/2012); Cholecystectomy (N/A, 03/29/2017); Colonoscopy with propofol (N/A, 10/23/2019); and Esophagogastroduodenoscopy (egd) with propofol (N/A, 10/23/2019). Family: family history includes Arthritis in his father, maternal grandfather, maternal grandmother, mother, paternal grandfather, and paternal grandmother; Asthma in his mother; Coronary artery disease in his father and mother; Heart disease in his father, maternal grandfather, maternal grandmother, paternal grandfather, and paternal grandmother; Hypertension in his maternal grandfather, maternal grandmother, mother, paternal grandfather, and  paternal grandmother; Mental illness in his mother.  Laboratory Chemistry Profile   Renal Lab Results  Component Value Date   BUN 13 04/21/2017   CREATININE 0.93 04/21/2017   LABCREA 79.3 03/05/2014   GFR 82.19 08/05/2014   GFRAA >60 04/21/2017   GFRNONAA >60 04/21/2017     Hepatic Lab Results  Component Value Date   AST 30 04/21/2017   ALT 23 04/21/2017   ALBUMIN 4.2 04/21/2017   ALKPHOS 86 04/21/2017   LIPASE 51 04/21/2017     Electrolytes Lab Results  Component Value Date   NA 138 04/21/2017   K 3.7 04/21/2017   CL 101 04/21/2017   CALCIUM 9.4 04/21/2017   MG 1.9 04/26/2020     Bone Lab Results  Component Value Date   VD25OH 31.95 03/05/2016   25OHVITD1 35 04/26/2020   25OHVITD2 <1.0 04/26/2020   25OHVITD3 35 04/26/2020     Inflammation (CRP: Acute Phase) (ESR: Chronic Phase) Lab Results  Component Value Date   CRP 10 04/26/2020   ESRSEDRATE 22 04/26/2020       Note: Above Lab results reviewed.  Recent Imaging Review  DG Si Joints CLINICAL DATA:  Bilateral hip and buttock pain.  EXAM: BILATERAL SACROILIAC JOINTS - 3+ VIEW  COMPARISON:  None.  FINDINGS: Sacroiliac joints have a symmetric and normal appearance without evidence of narrowing, widening or abnormal sclerosis. No erosions or destruction identified.  IMPRESSION: Negative.  Electronically Signed   By: Aletta Edouard M.D.   On: 10/29/2020 11:53 DG HIP UNILAT W OR W/O PELVIS 2-3 VIEWS RIGHT CLINICAL DATA:  Right hip pain.  EXAM: DG HIP (WITH OR WITHOUT PELVIS) 2-3V RIGHT  COMPARISON:  None.  FINDINGS: There is no evidence of hip fracture or dislocation. There is no evidence of arthropathy or other focal bone abnormality. The visualized bony pelvis is intact and has a normal appearance.  IMPRESSION: Negative.  Electronically Signed   By: Aletta Edouard M.D.   On: 10/29/2020 11:50 DG HIP UNILAT W OR W/O PELVIS 2-3 VIEWS LEFT CLINICAL DATA:  Left hip pain.  EXAM: DG  HIP (WITH OR WITHOUT PELVIS) 2-3V LEFT  COMPARISON:  None.  FINDINGS: There is no evidence of hip fracture or dislocation. There is no evidence of arthropathy or other focal bone abnormality.  IMPRESSION: Negative.  Electronically Signed   By: Aletta Edouard M.D.   On: 10/29/2020 11:48 Note: Reviewed        Physical Exam  General appearance: Well nourished, well developed, and well hydrated. In no apparent acute distress Mental status: Alert, oriented x 3 (person, place, & time)       Respiratory: No evidence of acute respiratory distress Eyes: PERLA Vitals: BP 117/71   Pulse 81   Temp (!) 96.9 F (36.1 C) (Temporal)   Resp 15   Ht _0  (1.88 m)   Wt (!) 317 lb (143.8 kg)   SpO2 98%   BMI 40.70 kg/m  BMI: Estimated body mass index is 40.7 kg/m as calculated from the following:   Height as of this encounter: _0  (1.88 m).   Weight as of this encounter: 317 lb (143.8 kg). Ideal: Ideal body weight: 82.2 kg (181 lb 3.5 oz) Adjusted ideal body weight: 106.8 kg (235 lb 8.5 oz)  Cervical Spine Area Exam  Skin & Axial Inspection: Well healed scar from previous spine surgery detected Alignment: Symmetrical Functional ROM: Pain restricted ROM, bilaterally Stability: No instability detected Muscle Tone/Strength: Functionally intact. No obvious neuro-muscular anomalies detected. Sensory (Neurological): Dermatomal pain pattern and neurogenic Palpation: No palpable anomalies               Upper Extremity (UE) Exam      Side: Right upper extremity   Side: Left upper extremity  Skin & Extremity Inspection: Skin color, temperature, and hair growth are WNL. No peripheral edema or cyanosis. No masses, redness, swelling, asymmetry, or associated skin lesions. No contractures.   Skin & Extremity Inspection: Skin color, temperature, and hair growth are WNL. No peripheral edema or cyanosis. No masses, redness, swelling, asymmetry, or associated skin lesions. No contractures.   Functional ROM: Pain restricted ROM for shoulder and elbow   Functional ROM: Pain restricted ROM for shoulder and elbow  Muscle Tone/Strength: Functionally intact. No obvious neuro-muscular anomalies detected.   Muscle Tone/Strength: Functionally intact. No obvious neuro-muscular anomalies detected.  Sensory (Neurological): Unimpaired           Sensory (Neurological): Unimpaired          Palpation: No palpable anomalies               Palpation: No palpable anomalies              Provocative Test(s):  Phalen's test: deferred Tinel's test: deferred Apley's scratch test (touch opposite shoulder):  Action 1 (Across chest): Decreased ROM Action 2 (Overhead): Decreased ROM Action 3 (LB reach): Decreased ROM       Provocative Test(s):  Phalen's test: deferred Tinel's test: deferred Apley's scratch test (touch opposite shoulder):  Action 1 (Across chest): Decreased ROM Action 2 (Overhead): Decreased ROM Action 3 (LB reach): Decreased ROM     Left abdominal pain tender to palpation no rebound or guarding   Assessment   Status Diagnosis  Persistent Persistent Persistent 1. Myelopathy of cervical spinal cord with cervical radiculopathy (HCC)   2. Thoracic radiculopathy   3. Failed cervical fusion   4. Bilateral occipital neuralgia   5. Spondylosis, cervical, with myelopathy   6. Abdominal pain, left lower quadrant   7. Bilateral hip pain   8. Chronic pain syndrome       Plan of Care  Mr. IGNATIUS KLOOS has a current medication list which includes the following long-term medication(s): carbidopa-levodopa, carbidopa-levodopa, clonazepam, duloxetine, glipizide-metformin, metformin, propranolol, [START ON 02/13/2021] hydrocodone-acetaminophen, [START ON 03/15/2021] hydrocodone-acetaminophen, [START ON 04/14/2021] hydrocodone-acetaminophen, nortriptyline, pregabalin, and sucralfate.  Pharmacotherapy (Medications Ordered): Meds ordered this encounter  Medications   DULoxetine (CYMBALTA)  30 MG capsule    Sig: Take 1 capsule (30 mg total) by mouth 2 (two) times daily.    Dispense:  60 capsule    Refill:  5   HYDROcodone-acetaminophen (NORCO) 10-325 MG tablet    Sig: Take 1 tablet by mouth every 6 (six) hours as needed. For chronic pain syndrome. Each Rx to last 30 days.    Dispense:  105 tablet    Refill:  0   HYDROcodone-acetaminophen (NORCO) 10-325 MG tablet  Sig: Take 1 tablet by mouth every 6 (six) hours as needed. For chronic pain syndrome. Each Rx to last 30 days.    Dispense:  105 tablet    Refill:  0   HYDROcodone-acetaminophen (NORCO) 10-325 MG tablet    Sig: Take 1 tablet by mouth every 6 (six) hours as needed. For chronic pain syndrome. Each Rx to last 30 days.    Dispense:  105 tablet    Refill:  0   HYDROcodone Bitartrate ER 10 MG CP12    Sig: Take 10 mg by mouth every 12 (twelve) hours. Must last 30 days.    Dispense:  60 capsule    Refill:  0    Chronic Pain: STOP Act (Not applicable) Fill 1 day early if closed on refill date. Avoid benzodiazepines within 8 hours of opioids   HYDROcodone Bitartrate ER 10 MG CP12    Sig: Take 10 mg by mouth every 12 (twelve) hours. Must last 30 days.    Dispense:  60 capsule    Refill:  0    Chronic Pain: STOP Act (Not applicable) Fill 1 day early if closed on refill date. Avoid benzodiazepines within 8 hours of opioids   HYDROcodone Bitartrate ER 10 MG CP12    Sig: Take 10 mg by mouth every 12 (twelve) hours. Must last 30 days.    Dispense:  60 capsule    Refill:  0    Chronic Pain: STOP Act (Not applicable) Fill 1 day early if closed on refill date. Avoid benzodiazepines within 8 hours of opioids   pregabalin (LYRICA) 100 MG capsule    Sig: Take 1 capsule (100 mg total) by mouth 3 (three) times daily.    Dispense:  90 capsule    Refill:  5    Do not place this medication, or any other prescription from our practice, on "Automatic Refill". Patient may have prescription filled one day early if pharmacy is closed on  scheduled refill date.   tiZANidine (ZANAFLEX) 4 MG tablet    Sig: Take 1 tablet (4 mg total) by mouth every 8 (eight) hours as needed for muscle spasms.    Dispense:  90 tablet    Refill:  11   nortriptyline (PAMELOR) 25 MG capsule    Sig: Take 2 capsules (50 mg total) by mouth at bedtime.    Dispense:  60 capsule    Refill:  5    Orders:  Orders Placed This Encounter  Procedures   TRIGGER POINT INJECTION    Standing Status:   Future    Standing Expiration Date:   04/21/2021    Scheduling Instructions:     Left abdomen with US guidance    Order Specific Question:   Where will this procedure be performed?    Answer:   [facility]    Follow-up plan:   Return in about 1 week (around 01/26/2021) for Left abdominal TPI with [place] , without sedation.     Diagnostic cervical facet medial branch nerve blocks C3-C7 b/l 05/06/19- did not help Diagnostic thoracic facet medial branch nerve blocks T1-T2, consider bilateral occipital nerve block  Cervical/trapezius trigger point injections SPG block                 Recent Visits Date Type Provider Dept  10/26/20 Office Visit Gillis Santa, MD [facility]  Showing recent visits within past 90 days and meeting all other requirements Today's Visits Date Type Provider Dept  01/19/21 Office Visit  Gillis Santa, MD [facility]  Showing today's visits and meeting all other requirements Future Appointments Date Type Provider Dept  02/01/21 Appointment Gillis Santa, MD [facility]  04/13/21 Appointment Gillis Santa, MD [facility]  Showing future appointments within next 90 days and meeting all other requirements I discussed the assessment and treatment plan with the patient. The patient was provided an opportunity to ask questions and all were answered. The patient agreed with the plan and demonstrated an understanding of the instructions.  Patient advised to call back or seek an in-person  evaluation if the symptoms or condition worsens.  Duration of encounter: 30 minutes.  Note by: Gillis Santa, MD Date: 01/19/2021; Time: 11:27 AM

## 2021-01-23 ENCOUNTER — Other Ambulatory Visit: Payer: Self-pay

## 2021-01-23 ENCOUNTER — Other Ambulatory Visit: Payer: Medicare Other | Admitting: Student

## 2021-01-23 DIAGNOSIS — Z515 Encounter for palliative care: Secondary | ICD-10-CM

## 2021-01-23 DIAGNOSIS — G20 Parkinson's disease: Secondary | ICD-10-CM

## 2021-01-23 DIAGNOSIS — G903 Multi-system degeneration of the autonomic nervous system: Secondary | ICD-10-CM

## 2021-01-23 DIAGNOSIS — R52 Pain, unspecified: Secondary | ICD-10-CM

## 2021-01-23 DIAGNOSIS — R531 Weakness: Secondary | ICD-10-CM

## 2021-01-23 NOTE — Progress Notes (Signed)
[facility] Palliative Care Consult Note Telephone: [phone]  Fax: [phone]   Date of encounter: 01/23/21 2:12 PM PATIENT NAME: Thomas Cole [address] [place] [address]   [phone] (home)  DOB: March 23, 1971 MRN: [id number] PRIMARY CARE PROVIDER:    Derinda Late, MD,  [address] and [facility] Medicine [place] [address] [phone]  REFERRING PROVIDER:   Derinda Late, MD [phone] [facility] and Internal Medicine [place],  [place] [address] [phone]  RESPONSIBLE PARTY:    Contact Information     Name Relation Home Work [place] Other [phone]          I met face to face with patient and family in the home. Palliative Care was asked to follow this patient by consultation request of  Derinda Late, MD to address advance care planning and complex medical decision making. This is the initial visit.                                     ASSESSMENT AND PLAN / RECOMMENDATIONS:   Advance Care Planning/Goals of Care: Goals include to maximize quality of life and symptom management. Patient/health care surrogate gave his/her permission to discuss.Our advance care planning conversation included a discussion about:    The value and importance of advance care planning  Experiences with loved ones who have been seriously ill or have died  Exploration of personal, cultural or spiritual beliefs that might influence medical decisions  Exploration of goals of care in the event of a sudden injury or illness  MOST form reviewed today; patient to review further with family. Will complete on next visit. Patient states he does not want live on a machine, no feeding tube.  CODE STATUS: Full Code  Symptom Management/Plan:  Suspected parkinson's disease, ? MSA-Continue carbidopa-levodopa, carbidopa-levodopa CR and Comtan as directed. Follow up with neurology as  scheduled.  Patient is requiring more assistance with adl's. Will refer to Palliative SW for recommendations in the home for caregivers. Will see if palliative RN can assist with filling pill box monthly.  Generalized weakness-encourage use of Donner. Family and caregiver to continue providing supportive care. Monitor for falls/safety.   Pain-secondary cervical myelopathy, cervical stenosis, chronic migraines, chronic pain syndrome. Patient to continue Hydrocodone ER 74m BID, norco 10-325 mg every 6 hours PRN, nortriptyline, duloxetine, Lyrica, tizanidine as directed. Patient to start Emgality injections monthly for migraines. Follow up with Pain management clinic and neurology as directed.   Follow up Palliative Care Visit: Palliative care will continue to follow for complex medical decision making, advance care planning, and clarification of goals. Return in 6-8 weeks or prn.  I spent 60 minutes providing this consultation. More than 50% of the time in this consultation was spent in counseling and care coordination.  PPS: 50%  HOSPICE ELIGIBILITY/DIAGNOSIS: TBD  Chief Complaint: Palliative Medicine initial visit.   HISTORY OF PRESENT ILLNESS:  Thomas SAWATZKYis a 50y.o. year old male  with suspected parkinson's disease, cervical myelopathy, ? MSA, seizures, labile hypertension, anxiety, GERD without esophagitis,  thoracic radiculopathy, hx of cervical spinal arthrodesis, chronic pain syndrome, thoracic facet joint syndrome, vitamin D deficiency, vitamin D deficiency, depression, GAD, obesity, PTSD, sleep apnea.   Patient resides at home. He is reporting increased weakness, requiring more assistance with adl's. Patient also needing  assistance with medication box. Has been on disability for the past 4 years. He is no longer able to drive. Has someone that has been helping him, but this situation is strained. He has two sisters, but they are not local. Patient is seeing neurology and pain  management regularly. Has upcoming injection due to his chronic abdominal pain. Patient endorses frequent headaches, migraines. He is to start monthly injection. He also endorses worsening pain to upper shoulder, neck, hips and joint pain. Reports occasional shortness of breath with exertion. Appetite varies; "comes and goes." Actually reports weight gain since 2019. Previous weight was in 250-260's. Endorses edema to BLE. Sleeping more during the day; sleeping well at night. Checks blood sugar daily each AM. Blood sugar 144 this am. Patient has [place], w/c, hospital bed.  A 10-point review of systems is negative, except for the pertinent positives and negatives detailed in the HPI.    History obtained from review of EMR, discussion with primary team, and interview with family, facility staff/caregiver and/or Mr. Wahlert.  I reviewed available labs, medications, imaging, studies and related documents from the EMR.  Records reviewed and summarized above.    Physical Exam: Weight: 317 pounds Pulse 84, resp 16, b/p 128/82, sats 97% on room air Constitutional: NAD General: frail appearing EYES: anicteric sclera, lids intact, no discharge  ENMT: intact hearing, oral mucous membranes moist, dentition intact CV: S1S2, RRR, trace LE edema Pulmonary: LCTA, no increased work of breathing, no cough, room air Abdomen: normo-active BS + 4 quadrants, soft and non tender GU: deferred MSK: moves all extremities, ambulatory with rollator [place] Skin: warm and dry, no rashes or wounds on visible skin Neuro: generalized weakness, A & O x 3 Psych: non-anxious affect, pleasant Hem/lymph/immuno: no widespread bruising CURRENT PROBLEM LIST:  Patient Active Problem List   Diagnosis Date Noted   Thoracic facet joint syndrome 09/24/2019   Chronic pain syndrome 07/14/2019   Failed cervical fusion 11/20/2018   Episode of recurrent major depressive disorder ([place]) 11/20/2018   Myelopathy of cervical spinal cord with  cervical radiculopathy (HCC) 05/13/2018   Bilateral occipital neuralgia 02/11/2018   Degeneration of cervical intervertebral disc with myelopathy 06/13/2017   H/O cervical spinal arthrodesis 05/16/2017   Thoracic radiculopathy 05/16/2017   Acute cholecystitis 03/29/2017   Anxiety 11/30/2016   Benign essential hypertension 11/30/2016   Seizure ([place]) 11/30/2016   Localization-related symptomatic epilepsy and epileptic syndromes with complex partial seizures, not intractable, without status epilepticus ([place]) 02/21/2016   Generalized anxiety disorder 06/23/2015   Major depressive disorder, recurrent episode, severe ([place]) 06/23/2015   PTSD (post-traumatic stress disorder) 06/16/2015   SOB (shortness of breath) 08/06/2014   Obesity (BMI 30-39.9) 11/11/2013   Migraines 11/11/2013   Asthma, mild intermittent 11/11/2013   History of seizures 11/11/2013   GERD without esophagitis 11/11/2013   Depression 11/11/2013   Labile hypertension 03/19/2013   Spondylosis, cervical, with myelopathy 09/17/2012   S/P appendectomy 04/09/2011   Sleep apnea 05/25/2010   PAST MEDICAL HISTORY:  Active Ambulatory Problems    Diagnosis Date Noted   Sleep apnea 05/25/2010   Spondylosis, cervical, with myelopathy 09/17/2012   Labile hypertension 03/19/2013   Obesity (BMI 30-39.9) 11/11/2013   Migraines 11/11/2013   Asthma, mild intermittent 11/11/2013   History of seizures 11/11/2013   GERD without esophagitis 11/11/2013   Depression 11/11/2013   SOB (shortness of breath) 08/06/2014   PTSD (post-traumatic stress disorder) 06/16/2015   Generalized anxiety disorder 06/23/2015   Major depressive disorder, recurrent episode, severe (  [place]) 06/23/2015   Localization-related symptomatic epilepsy and epileptic syndromes with complex partial seizures, not intractable, without status epilepticus ([place]) 02/21/2016   Acute cholecystitis 03/29/2017   Anxiety 11/30/2016   Benign essential hypertension 11/30/2016   S/P  appendectomy 04/09/2011   Seizure ([place]) 11/30/2016   H/O cervical spinal arthrodesis 05/16/2017   Myelopathy of cervical spinal cord with cervical radiculopathy ([place]) 05/13/2018   Bilateral occipital neuralgia 02/11/2018   Degeneration of cervical intervertebral disc with myelopathy 06/13/2017   Thoracic radiculopathy 05/16/2017   Failed cervical fusion 11/20/2018   Episode of recurrent major depressive disorder ([place]) 11/20/2018   Chronic pain syndrome 07/14/2019   Thoracic facet joint syndrome 09/24/2019   Resolved Ambulatory Problems    Diagnosis Date Noted   CHEST PAIN 03/23/2010   Neck pain 03/19/2013   Dyspnea 09/18/2014   Past Medical History:  Diagnosis Date   Asthma    Back problem    Cervical spondylosis without myelopathy 0/53/9767   Complication of anesthesia    Diabetes mellitus without complication ([place]) 34/1937   GERD (gastroesophageal reflux disease)    Hypertension    Migraine headache    Seizures (HCC)    Shortness of breath    SOCIAL HX:  Social History   Tobacco Use   Smoking status: Never   Smokeless tobacco: Never  Substance Use Topics   Alcohol use: No    Alcohol/week: 0.0 standard drinks   FAMILY HX:  Family History  Problem Relation Age of Onset   Coronary artery disease Mother    Hypertension Mother    Mental illness Mother    Arthritis Mother    Asthma Mother    Coronary artery disease Father    Heart disease Father    Arthritis Father    Arthritis Maternal Grandmother    Heart disease Maternal Grandmother    Hypertension Maternal Grandmother    Arthritis Maternal Grandfather    Heart disease Maternal Grandfather    Hypertension Maternal Grandfather    Arthritis Paternal Grandmother    Heart disease Paternal Grandmother    Hypertension Paternal Grandmother    Arthritis Paternal Grandfather    Heart disease Paternal Grandfather    Hypertension Paternal Grandfather    Cancer Neg Hx    Diabetes Neg Hx    Stroke Neg Hx        ALLERGIES:  Allergies  Allergen Reactions   Amitriptyline Other (See Comments)    Felt really bad- psychologically   Bee Venom Itching and Swelling   Chlorhexidine Itching    REACTION TO WIPES/ CLOTHES ONLY==he can tolerate the SCRUB LIQUID   Carbamazepine Itching and Rash   Meloxicam Itching and Rash   Morphine And Related Anxiety    Makes him restless  Makes him restless    Sulfa Antibiotics Itching, Rash and Other (See Comments)     PERTINENT MEDICATIONS:  Outpatient Encounter Medications as of 01/23/2021  Medication Sig   butalbital-acetaminophen-caffeine (FIORICET) 50-325-40 MG tablet Take 1 tablet by mouth 2 (two) times daily as needed for headache or migraine.   carbidopa-levodopa (SINEMET CR) 50-200 MG tablet Take 1 tablet by mouth at bedtime.   carbidopa-levodopa (SINEMET IR) 25-100 MG tablet TAKE 2 TABLETS BY MOUTH 3 (THREE) TIMES DAILY   Cholecalciferol (VITAMIN D3) 25 MCG (1000 UT) CAPS Take by mouth.   clonazePAM (KLONOPIN) 1 MG tablet Take 1 mg by mouth at bedtime as needed.   DULoxetine (CYMBALTA) 30 MG capsule Take 1 capsule (30 mg total) by mouth 2 (two)  times daily.   entacapone (COMTAN) 200 MG tablet Take 200 mg by mouth at bedtime.   glipizide-metformin (METAGLIP) 2.5-250 MG tablet Take 1 tablet by mouth 1 day or 1 dose.   hydrochlorothiazide (HYDRODIURIL) 25 MG tablet Take 25 mg by mouth daily.   [START ON 01/26/2021] HYDROcodone Bitartrate ER 10 MG CP12 Take 10 mg by mouth every 12 (twelve) hours. Must last 30 days.   [START ON 02/25/2021] HYDROcodone Bitartrate ER 10 MG CP12 Take 10 mg by mouth every 12 (twelve) hours. Must last 30 days.   [START ON 03/27/2021] HYDROcodone Bitartrate ER 10 MG CP12 Take 10 mg by mouth every 12 (twelve) hours. Must last 30 days.   [START ON 02/13/2021] HYDROcodone-acetaminophen (NORCO) 10-325 MG tablet Take 1 tablet by mouth every 6 (six) hours as needed. For chronic pain syndrome. Each Rx to last 30 days.   [START ON  03/15/2021] HYDROcodone-acetaminophen (NORCO) 10-325 MG tablet Take 1 tablet by mouth every 6 (six) hours as needed. For chronic pain syndrome. Each Rx to last 30 days.   [START ON 04/14/2021] HYDROcodone-acetaminophen (NORCO) 10-325 MG tablet Take 1 tablet by mouth every 6 (six) hours as needed. For chronic pain syndrome. Each Rx to last 30 days.   losartan (COZAAR) 100 MG tablet Take 100 mg by mouth daily.   metFORMIN (GLUCOPHAGE) 500 MG tablet Take 500 mg by mouth daily with breakfast.   nortriptyline (PAMELOR) 25 MG capsule Take 2 capsules (50 mg total) by mouth at bedtime.   omeprazole (PRILOSEC) 40 MG capsule Take 1 capsule by mouth 2 (two) times daily.   phenytoin (DILANTIN) 100 MG ER capsule Take by mouth.   pregabalin (LYRICA) 100 MG capsule Take 1 capsule (100 mg total) by mouth 3 (three) times daily.   propranolol (INDERAL) 40 MG tablet Take 40 mg by mouth 2 (two) times daily. Currently takes 1/2 tablet once per day   sucralfate (CARAFATE) 1 g tablet Take 1 g by mouth 4 (four) times daily. (Patient not taking: Reported on 01/19/2021)   tiZANidine (ZANAFLEX) 4 MG tablet Take 1 tablet (4 mg total) by mouth every 8 (eight) hours as needed for muscle spasms.   vitamin B-12 (CYANOCOBALAMIN) 1000 MCG tablet Take by mouth.   No facility-administered encounter medications on file as of 01/23/2021.   Thank you for the opportunity to participate in the care of Mr. Cooler.  The palliative care team will continue to follow. Please call our office at [phone] if we can be of additional assistance.   Ezekiel Slocumb, NP   COVID-19 PATIENT SCREENING TOOL Asked and negative response unless otherwise noted:  Have you had symptoms of covid, tested positive or been in contact with someone with symptoms/positive test in the past 5-10 days? No

## 2021-02-01 ENCOUNTER — Ambulatory Visit: Payer: Medicare Other | Admitting: Student in an Organized Health Care Education/Training Program

## 2021-02-05 ENCOUNTER — Encounter: Payer: Self-pay | Admitting: Student in an Organized Health Care Education/Training Program

## 2021-02-06 ENCOUNTER — Ambulatory Visit: Payer: Medicare Other | Admitting: Student in an Organized Health Care Education/Training Program

## 2021-02-20 ENCOUNTER — Ambulatory Visit: Payer: Medicare Other | Admitting: Student in an Organized Health Care Education/Training Program

## 2021-02-20 ENCOUNTER — Encounter: Payer: Self-pay | Admitting: Student in an Organized Health Care Education/Training Program

## 2021-02-20 ENCOUNTER — Telehealth: Payer: Self-pay

## 2021-02-20 NOTE — Telephone Encounter (Signed)
PC SW outreached patient to schedule in person [facility] RN/SW visit.    Visit scheduled for 02/28/21 @1130am .

## 2021-02-21 ENCOUNTER — Other Ambulatory Visit: Payer: Self-pay

## 2021-02-21 ENCOUNTER — Other Ambulatory Visit: Payer: Self-pay | Admitting: Student in an Organized Health Care Education/Training Program

## 2021-02-21 ENCOUNTER — Other Ambulatory Visit: Payer: Medicare Other | Admitting: Student

## 2021-02-21 DIAGNOSIS — M5414 Radiculopathy, thoracic region: Secondary | ICD-10-CM

## 2021-02-21 DIAGNOSIS — R52 Pain, unspecified: Secondary | ICD-10-CM

## 2021-02-21 DIAGNOSIS — G903 Multi-system degeneration of the autonomic nervous system: Secondary | ICD-10-CM

## 2021-02-21 DIAGNOSIS — G20 Parkinson's disease: Secondary | ICD-10-CM

## 2021-02-21 DIAGNOSIS — Z515 Encounter for palliative care: Secondary | ICD-10-CM

## 2021-02-21 NOTE — Progress Notes (Signed)
[facility] Palliative [facility] Consult Note Telephone: [phone]  Fax: [phone]    Date of encounter: 02/21/21 3:47 PM PATIENT NAME: Thomas Cole [address] [place] [address]   [phone] (home)  DOB: [birth year] MRN: [id number] PRIMARY CARE PROVIDER:    Derinda Late, MD,  [address] and Internal Medicine [place] [address] [phone]  REFERRING PROVIDER:   Derinda Late, MD [phone] [address] [facility] and Internal Medicine [place],  [place] [address] [phone]  RESPONSIBLE PARTY:    Contact Information   None on File      I met face to face with patient in the home. Palliative Care was asked to follow this patient by consultation request of  Thomas Late, MD to address advance care planning and complex medical decision making. This is a follow up visit.                                   ASSESSMENT AND PLAN / RECOMMENDATIONS:   Advance Care Planning/Goals of Care: Goals include to maximize quality of life and symptom management.  CODE STATUS: Full Code   Symptom Management/Plan:  Suspected parkinson's disease, ? MSA-Continue carbidopa-levodopa, carbidopa-levodopa CR and Comtan as directed. Follow up with neurology as scheduled. Patient to have joint visit with Palliative SW next week with RN; will discuss long range planning, additional support in the home.    Pain-secondary cervical myelopathy, cervical stenosis, chronic migraines, chronic pain syndrome. Patient to continue Hydrocodone ER 32m BID, norco 10-325 mg every 6 hours PRN, nortriptyline, duloxetine, Lyrica, tizanidine as directed. Follow up with Pain management clinic and neurology as directed. Patient is considering acupuncture. We also discussed food diary regarding abdominal pain.   Follow up Palliative Care Visit: Palliative care will continue to follow for complex medical decision making,  advance care planning, and clarification of goals. Return in 8 weeks or prn.  I spent 40 minutes providing this consultation. More than 50% of the time in this consultation was spent in counseling and care coordination.   PPS: 50%  HOSPICE ELIGIBILITY/DIAGNOSIS: TBD  Chief Complaint: Palliative Medicine follow up visit.   HISTORY OF PRESENT ILLNESS:  Thomas STURGELLis a 50y.o. year old male  with suspected parkinson's disease, cervical myelopathy, ? MSA, seizures, labile hypertension, anxiety, GERD without esophagitis, thoracic radiculopathy, hx of cervical spinal arthrodesis, chronic pain syndrome, thoracic facet joint syndrome, vitamin D deficiency, vitamin D deficiency, depression, GAD, obesity, PTSD, sleep apnea.  Patient resides at home. He is reporting increased weakness, requiring more assistance with adl's. Still having abdominal pain. Patient cancelled appointment for injection to potentially help with his abdominal pain. He reports pain as sharp and burning. Considering acupuncture for pain and abdominal discomfort. Feels his appetite is declining some; no weight loss. Sometimes having  4-5 times stools a day; vary from watery to semi formed. He is awaiting on pharmacy to get Emgality injections. A 10-point review of systems is negative, except for the pertinent positives and negatives detailed in the HPI.  History obtained from review of EMR, discussion with primary team, and interview with family, facility staff/caregiver and/or Thomas Cole  I reviewed available labs, medications, imaging, studies and related documents from the EMR.  Records reviewed and summarized above.   Physical Exam:  Pulse 100, resp 16, b/p 130/80, sats 97%  Constitutional: NAD  General: frail appearing EYES: anicteric sclera, lids intact, no discharge  ENMT: intact hearing, oral mucous membranes moist, dentition intact CV: S1S2, RRR, no LE edema Pulmonary: LCTA, no increased work of breathing, no cough,  room air Abdomen: normo-active BS + 4 quadrants, soft and non tender, no ascites GU: deferred MSK: no sarcopenia, moves all extremities, ambulatory Skin: warm and dry, no rashes or wounds on visible skin Neuro: generalized weakness, A & O x 3 Psych: non-anxious affect, pleasant Hem/lymph/immuno: no widespread bruising   Thank you for the opportunity to participate in the care of Thomas Cole.  The palliative care team will continue to follow. Please call our office at [phone] if we can be of additional assistance.   Thomas Slocumb, NP   COVID-19 PATIENT SCREENING TOOL Asked and negative response unless otherwise noted:   Have you had symptoms of covid, tested positive or been in contact with someone with symptoms/positive test in the past 5-10 days? No

## 2021-02-28 ENCOUNTER — Other Ambulatory Visit: Payer: Self-pay

## 2021-02-28 ENCOUNTER — Other Ambulatory Visit: Payer: Medicare Other

## 2021-02-28 VITALS — HR 102 | Temp 97.9°F

## 2021-02-28 DIAGNOSIS — Z515 Encounter for palliative care: Secondary | ICD-10-CM

## 2021-02-28 NOTE — Progress Notes (Signed)
PATIENT NAME: Thomas Cole DOB: March 19, 1971 MRN: [id number]  PRIMARY CARE PROVIDER: Derinda Late, MD  RESPONSIBLE PARTY:  Acct ID - Guarantor Home Phone Work Phone Relationship Acct Type  [id number] [place] [phone]  Self P/F     [address], [place], [place] [address]    PLAN OF CARE and INTERVENTIONS:               1.  GOALS OF CARE/ ADVANCE CARE PLANNING:  Remain home and independent for as long as possible.  Discussed code status and MOST form.  Patient desires DNR.  MOST is limited intervention, IVF if indicated, ABT if indicated and yes for tube feeding.   Palliative Care NP will be updated on desires.  Siblings are working with patient to establish a new Forensic scientist.                2.  PATIENT/CAREGIVER EDUCATION:  Safety and Medication Management.               4. PERSONAL EMERGENCY PLAN:  Activate 911 for emergencies.                5.  DISEASE STATUS:  Joint visit completed with patient, two sisters and [place], [place].    Safety:  Patient sustained a fall a couple of nights ago.  No injuries reported.  Discussed use of a life alert and sisters will check into this.  Patient is showering himself but takes a significant amount of energy.  Discussed use of a slide shower chair.  Sister has one and will see if this will work for patient.  Patient requires the use of a wheelchair outside of the home.  Provided [employer] as a resource that makes ramps.  Sister will contact the church regarding a ramp.  Medication Management: Sister is currently managing pill box and has 3 weeks of pill boxes.  We discussed contacting [facility] for bubble pill packs.  Sister will check on this but noted that insurance may be changing and medications may move to mail orders.   Transportation:  Advised patient of [facility] in the event family members are not able to transport patient.   HISTORY OF PRESENT ILLNESS:  Thomas Cole is a 50 y.o.  year old male  with suspected parkinson's disease, cervical myelopathy, ? MSA, seizures, labile hypertension, anxiety, GERD without esophagitis,  thoracic radiculopathy, hx of cervical spinal arthrodesis, chronic pain syndrome, thoracic facet joint syndrome, vitamin D deficiency, vitamin D deficiency, depression, GAD, obesity, PTSD, sleep apnea.  CODE STATUS: Full but desires DNR status see above. ADVANCED DIRECTIVES: No MOST FORM: No PPS: 50%   PHYSICAL EXAM:   VITALS: Today's Vitals   02/28/21 1152  Pulse: (!) 102  Temp: 97.9 F (36.6 C)  SpO2: 95%    LUNGS: clear to auscultation  CARDIAC: Cor Tachy}  EXTREMITIES: trace SKIN: Skin color, texture, turgor normal. No rashes or lesions or mobility and turgor normal  NEURO: positive for gait problems       Lorenza Burton, RN

## 2021-02-28 NOTE — Progress Notes (Signed)
COMMUNITY PALLIATIVE CARE SW NOTE  PATIENT NAME: Thomas Cole DOB: Oct 07, 1970 MRN: [id number]  PRIMARY CARE PROVIDER: Derinda Late, MD  RESPONSIBLE PARTY:  Acct ID - Guarantor Home Phone Work Phone Relationship Acct Type  [id number] Alger [phone]  Self P/F     [address], [place], [place] [id number]     PLAN OF CARE and INTERVENTIONS:             GOALS OF CARE/ ADVANCE CARE PLANNING:  Patient is currently a FULL CODE, but has voiced wishes to be a DNR. MOST form discussed in detail and completed with patient and family, to reflect DNR status with limited interventions, limitation of ABT use when infection occurs, IV's and feeding tube if indicated for a defined trial period. Patient's goal is to remain in the home independently and safe as possible. Patient and siblings are in the process of completing new ACP forms to include POA and living will. 2 Sisters Cindy & and 1 brother that lives in [place].    2.         SOCIAL/EMOTIONAL/SPIRITUAL ASSESSMENT/ INTERVENTIONS:  SW and RN Almyra Free met with patient, patient's 2 sisters, Jenny Reichmann and Judeen Hammans in patients home for follow PC visit.  Patient lives in a one story home with wife. Patient has early dementia and is managing it well.   SW stated purpose of visit. Patient presents as 50 y.o male with MAS (multiple atrophy syndrome), chronic pain, and other medical complexities. Patient and sisters updated SW and RN medical condition and changes. Patient has been stable since last in home visit. No hospitalizations. However, patient had fall in bathroom few nights ago, no injuries sustained but stated he fell on his knee pretty hard, stated that his leg gave out and was able to get himself up off of the floor. Patient uses RW. Life alert system discussed. Sisters aware of companies and cost and will look into this with patient. Will inquire with his insurance company if it will cover cost. Also discussed having fire department have  home access in case of a detrimental fall.   Patient share is in pain constantly, some days are worse than others. Patient is being followed by pain management physician, that manages his pain medications.    Patient sleeps pretty good at night and naps during the day. Poor appetite. Has days where he does not want to eat. Has stomach pains/discomfort daily due to atrophy in stomach. Patients sisters assist with meal preps when they visit and patient is able to use microwave and stove. Patient drinks supplement drinks daily.     RN reviewed meds and took vitals. Patient has diabetes and checks his insulin independently daily. Sister Jenny Reichmann is assisting with managing medications and prepping pill box for 3 weeks a time, as she lives in [place]. RN discussed the option of patients pharmacy, [facility], providing bubble pack for meds, patient and sister will look into this option. Patient shared that has an appointment at [facility] point acupuncture appt 12/13.  Psychosocial assessment completed.  Resources discussed to include transportation, SW provided sister Judeen Hammans with contact to [employer], currently patient does not drive and family provides transport to medical appointments and grocery shopping. No financial needs identified. Patient is on fixed income. No food deficits. Patient currently does not have a caregiver any longer, but feels he is doing fine with out one.  Appearance: well groomed and developed.  Mental Status: Alert/oriented x 4 (person, place, time, situation)  Eye Contact: Good  Thought Process: Rational  Thought Content: Denies current or past history of suicidal/homicidal ideation  Speech: Normal rate, volume, tone  Mood: Normal Affect: Congruent to endorsed mood, full ranging Insight: Realistic assessment of situation  Judgement: Good  Interaction Style: Cooperative  Patient admits to feelings of anxiety at times and hx of depression. Mental health discussed  with patient to include counseling for adjustment and depressive symptoms related to medical complexities and loss of independence and ability to work, as patient is a disabled Engineer, structural. Patient also exhibits stress response symptoms in relation to his medical condition and becomes overwhelmed at times. Patient aware of support groups, but did not express interest at this time, SW suggested outreaching his recent job for counseling support recommendations as he will feel more comfortable with confiding in a relatable source (PD or military connected therapist). SW also discussed MAS support resources. SW will continue to provide ongoing support as needed.   SW provided active and reflective listening with reciprocal empathic responding. Palliative care will continue to monitor and assist with long term care planning as needed.    3.         PATIENT/CAREGIVER EDUCATION/ COPING:  Patient A&O x3 and engaged during visit, patient was able to answer questions appropriately. Patient has anxiety & depression which is attempting to be managed by an SSRI medication. PHQ 9 completed and indicated 2; which suggest minimal depression and no treatment is required at this time. 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 Patient and sisters are attempting to do things together that patient enjoys such as working on his truck. Patient has supportive family and friends.   4.         PERSONAL EMERGENCY PLAN:  Patient will call 9-1-1 for emergencies. Life alert system discussed.   5.         COMMUNITY RESOURCES COORDINATION/ HEALTH CARE NAVIGATION:  Patient and his sisters manages his care.   6.         FINANCIAL/LEGAL CONCERNS/INTERVENTIONS:  None.  On fixed income. Sister to assist patient with looking into his long term disability benefits and when/if they and.     SOCIAL HX:  Social History   Tobacco Use   Smoking status: Never    Smokeless tobacco: Never  Substance Use Topics   Alcohol use: No    Alcohol/week: 0.0 standard drinks    CODE STATUS: currently Full Code, until DNR is provided., as patient wishes to be a DNR  ADVANCED DIRECTIVES: in process MOST FORM COMPLETE: discussed in detail , waiting for signatures. HOSPICE EDUCATION PROVIDED: N  PPS: Patient is independent - supervision with all ADL's at this time. Family assist with meal prep and grocery shopping. Patient is A&O x3 with average insight and judgement.  DME: has WC, electric WC, SPC, hospital bed, shower chair, and rollator. Considering having a ramp built.   Time spent: 1 hr     [place], [place]

## 2021-03-20 ENCOUNTER — Telehealth: Payer: Self-pay | Admitting: Student in an Organized Health Care Education/Training Program

## 2021-03-20 NOTE — Telephone Encounter (Signed)
Spoke with pharmacist and she was wanting to make sure what patient needs to do for pill count d/t the medications being in a bubble pack.  I asked her to have him bring the bubble pack with a label as to when it was filled. Verbalizes u/o information.

## 2021-03-20 NOTE — Telephone Encounter (Signed)
Thomas Cole from [facility] called stating the patient is starting meds in bubble pack and has some questions. Please call [phone]

## 2021-04-13 ENCOUNTER — Ambulatory Visit
Payer: Medicare Other | Attending: Student in an Organized Health Care Education/Training Program | Admitting: Student in an Organized Health Care Education/Training Program

## 2021-04-13 ENCOUNTER — Encounter: Payer: Self-pay | Admitting: Student in an Organized Health Care Education/Training Program

## 2021-04-13 ENCOUNTER — Other Ambulatory Visit: Payer: Self-pay

## 2021-04-13 VITALS — BP 133/85 | HR 105 | Temp 97.1°F | Resp 18 | Ht 74.0 in | Wt 312.0 lb

## 2021-04-13 DIAGNOSIS — G43009 Migraine without aura, not intractable, without status migrainosus: Secondary | ICD-10-CM | POA: Diagnosis present

## 2021-04-13 DIAGNOSIS — M5414 Radiculopathy, thoracic region: Secondary | ICD-10-CM | POA: Insufficient documentation

## 2021-04-13 DIAGNOSIS — M25551 Pain in right hip: Secondary | ICD-10-CM | POA: Diagnosis present

## 2021-04-13 DIAGNOSIS — G959 Disease of spinal cord, unspecified: Secondary | ICD-10-CM | POA: Insufficient documentation

## 2021-04-13 DIAGNOSIS — M4712 Other spondylosis with myelopathy, cervical region: Secondary | ICD-10-CM | POA: Diagnosis present

## 2021-04-13 DIAGNOSIS — M5412 Radiculopathy, cervical region: Secondary | ICD-10-CM | POA: Insufficient documentation

## 2021-04-13 DIAGNOSIS — M960 Pseudarthrosis after fusion or arthrodesis: Secondary | ICD-10-CM | POA: Insufficient documentation

## 2021-04-13 DIAGNOSIS — G894 Chronic pain syndrome: Secondary | ICD-10-CM | POA: Insufficient documentation

## 2021-04-13 DIAGNOSIS — M25552 Pain in left hip: Secondary | ICD-10-CM | POA: Diagnosis present

## 2021-04-13 DIAGNOSIS — R1032 Left lower quadrant pain: Secondary | ICD-10-CM | POA: Insufficient documentation

## 2021-04-13 DIAGNOSIS — M5481 Occipital neuralgia: Secondary | ICD-10-CM | POA: Diagnosis present

## 2021-04-13 MED ORDER — HYDROCODONE BITARTRATE ER 10 MG PO CP12: 10.0000 mg | ORAL_CAPSULE | Freq: Two times a day (BID) | ORAL | 0 refills | Status: AC

## 2021-04-13 MED ORDER — UBRELVY 100 MG PO TABS: ORAL_TABLET | ORAL | 1 refills | Status: DC

## 2021-04-13 MED ORDER — DULOXETINE HCL 60 MG PO CPEP: 60.0000 mg | ORAL_CAPSULE | Freq: Every day | ORAL | 5 refills | Status: DC

## 2021-04-13 MED ORDER — HYDROCODONE BITARTRATE ER 10 MG PO CP12: 10.0000 mg | ORAL_CAPSULE | Freq: Two times a day (BID) | ORAL | 0 refills | Status: DC

## 2021-04-13 MED ORDER — HYDROCODONE-ACETAMINOPHEN 10-325 MG PO TABS: 1.0000 | ORAL_TABLET | Freq: Three times a day (TID) | ORAL | 0 refills | Status: DC | PRN

## 2021-04-13 NOTE — Progress Notes (Signed)
Nursing Pain Medication Assessment:  Safety precautions to be maintained throughout the outpatient stay will include: orient to surroundings, keep bed in low position, maintain call bell within reach at all times, provide assistance with transfer out of bed and ambulation.  Medication Inspection Compliance: Pill count conducted under aseptic conditions, in front of the patient. Neither the pills nor the bottle was removed from the patient's sight at any time. Once count was completed pills were immediately returned to the patient in their original bottle.  Medication: Hydrocodone/APAP Pill/Patch Count:  45 of 105 pills remain Pill/Patch Appearance: Markings consistent with prescribed medication Bottle Appearance: Standard pharmacy container. Clearly labeled. Filled Date: 32 / 21 / 2022 Last Medication intake:  Today  Hydrocodone Bitartrate 10 mg 30/60 Filled 03-27-2021 today

## 2021-04-13 NOTE — Progress Notes (Signed)
PROVIDER NOTE: Information contained herein reflects review and annotations entered in association with encounter. Interpretation of such information and data should be left to medically-trained personnel. Information provided to patient can be located elsewhere in the medical record under "Patient Instructions". Document created using STT-dictation technology, any transcriptional errors that may result from process are unintentional.    Patient: Thomas Cole  Service Category: E/M  Provider: Gillis Santa, MD  DOB: July 12, 1970  DOS: 04/13/2021  Specialty: Interventional Pain Management  MRN: [id number]  Setting: Ambulatory outpatient  PCP: Derinda Late, MD  Type: Established Patient    Referring Provider: Derinda Late, MD  Location: Office  Delivery: Face-to-face     HPI  [name], a 51 y.o. year old male, is here today because of his Thoracic radiculopathy [M54.14]. Mr. Crombie primary complain today is Back Pain, Neck Pain, and Shoulder Pain  Last encounter: My last encounter with him was on 01/19/2021 Pertinent problems: Mr. Brodrick has Spondylosis, cervical, with myelopathy; Migraines; History of seizures; Generalized anxiety disorder; and Bilateral occipital neuralgia on their pertinent problem list. Pain Assessment: Severity of Chronic pain is reported as a 10-Worst pain ever/10. Location: Back (denies raditation, but has weakneess in legs, neck pain radiates into shoulders and pain in left arm)  / . Onset: More than a month ago. Quality: Hervey Ard, Aching. Timing: Constant. Modifying factor(s): nothing. Vitals:  height is 6' 2"  (1.88 m) and weight is 312 lb (141.5 kg) (abnormal). His temperature is 97.1 F (36.2 C) (abnormal). His blood pressure is 133/85 and his pulse is 105 (abnormal). His respiration is 18 and oxygen saturation is 100%.   Reason for encounter: medication management.    Patient presents today for medication management.  No significant change in his medical  history.   Having increased migraines, finds Fioricet less effective.  Discussed Roselyn Meier for abortive migraine management. Otherwise we will modify patient's chronic pain regimen.  Transition from Cymbalta 30 mg twice daily to 60 mg daily. Reduce hydrocodone to 10 mg every 8 hours as needed, continue extended release hydrocodone at 10 mg twice daily. Continue Lyrica at 100 mg 3 times daily  Pharmacotherapy Assessment  Analgesic: Hydrocodone extended release 10 mg twice daily, hydrocodone immediate release 10 mg 3 times daily as needed for breakthrough pain    Monitoring:Hydrocodone extended release 10 mg twice daily, hydrocodone immediate release 10 mg 3 times daily as needed for breakthrough pain   [place] PMP: PDMP reviewed during this encounter.       Pharmacotherapy: No side-effects or adverse reactions reported. Compliance: No problems identified. Effectiveness: Clinically acceptable.  UDS:  Summary  Date Value Ref Range Status  10/26/2020 Note  Final    Comment:    ==================================================================== [facility] (MW) ==================================================================== Test                             Result       Flag       Units  Drug Present and Declared for Prescription Verification   Hydrocodone                    349          EXPECTED   ng/mg creat   Hydromorphone                  95           EXPECTED   ng/mg creat  Dihydrocodeine                 108          EXPECTED   ng/mg creat   Norhydrocodone                 1578         EXPECTED   ng/mg creat    Sources of hydrocodone include scheduled prescription medications.    Hydromorphone, dihydrocodeine and norhydrocodone are expected    metabolites of hydrocodone. Hydromorphone and dihydrocodeine are    also available as scheduled prescription medications.  Drug Absent but Declared for Prescription Verification   Clonazepam                     Not Detected UNEXPECTED  ng/mg creat   Butalbital                     Not Detected UNEXPECTED ==================================================================== Test                      Result    Flag   Units      Ref Range   Creatinine              65               mg/dL      >=20 ==================================================================== Declared Medications:  The flagging and interpretation on this report are based on the  following declared medications.  Unexpected results may arise from  inaccuracies in the declared medications.   **Note: The testing scope of this panel includes these medications:   Butalbital (Fioricet)  Clonazepam (Klonopin)  Hydrocodone  Hydrocodone (Norco)   **Note: The testing scope of this panel does not include the  following reported medications:   Acetaminophen (Fioricet)  Acetaminophen (Norco)  Caffeine (Fioricet)  Carbidopa (Sinemet)  Cyanocobalamin  Duloxetine (Cymbalta)  Entacapone  Glipizide  Hydrochlorothiazide (Hydrodiuril)  Levodopa (Sinemet)  Losartan (Cozaar)  Metformin (Glucophage)  Nortriptyline (Pamelor)  Omeprazole (Prilosec)  Phenytoin (Dilantin)  Pregabalin (Lyrica)  Propranolol (Inderal)  Sucralfate (Carafate)  Tizanidine (Zanaflex)  Vitamin D3 ==================================================================== For clinical consultation, please call [phone]. ====================================================================       ROS  Constitutional: Denies any fever or chills Gastrointestinal: No reported hemesis, hematochezia, vomiting, or acute GI distress Musculoskeletal:  cervicalgia  L>right hip pain; left abdominal pain  Neurological: No reported episodes of acute onset apraxia, aphasia, dysarthria, agnosia, amnesia, paralysis, loss of coordination, or loss of consciousness  Medication Review  DULoxetine, HYDROcodone Bitartrate ER, HYDROcodone-acetaminophen, Ubrogepant, Vitamin D3,  butalbital-acetaminophen-caffeine, carbidopa-levodopa, clonazePAM, entacapone, furosemide, glipizide-metformin, hydrochlorothiazide, losartan, metFORMIN, nortriptyline, omeprazole, phenytoin, pregabalin, propranolol, sucralfate, tiZANidine, and vitamin B-12  History Review  Allergy: Mr. Lindholm is allergic to amitriptyline, bee venom, chlorhexidine, carbamazepine, meloxicam, morphine and related, and sulfa antibiotics. Drug: Mr. Bissonette  reports no history of drug use. Alcohol:  reports no history of alcohol use. Tobacco:  reports that he has never smoked. He has never used smokeless tobacco. Social: Mr. Mitchelle  reports that he has never smoked. He has never used smokeless tobacco. He reports that he does not drink alcohol and does not use drugs. Medical:  has a past medical history of Anxiety, Asthma, Back problem, Benign essential hypertension (11/30/2016), Cervical spondylosis without myelopathy (09/26/9483), Complication of anesthesia, Depression, Diabetes mellitus without complication ([place]) (46/2703), GERD (gastroesophageal reflux disease), Hypertension, Labile hypertension (03/19/2013), Major depressive disorder, recurrent episode, severe ([place]) (06/23/2015), Migraine headache,  Migraines (11/11/2013), S/P appendectomy (04/09/2011), Seizure ([place]) (11/30/2016), Seizures ([place]), Shortness of breath, and SOB (shortness of breath) (08/06/2014). Surgical: Mr. Mccamish  has a past surgical history that includes Knee surgery; Posterior fusion cervical spine; Cervical disc surgery; Appendectomy (12); Anterior cervical corpectomy (N/A, 09/15/2012); Cholecystectomy (N/A, 03/29/2017); Colonoscopy with propofol (N/A, 10/23/2019); and Esophagogastroduodenoscopy (egd) with propofol (N/A, 10/23/2019). Family: family history includes Arthritis in his father, maternal grandfather, maternal grandmother, mother, paternal grandfather, and paternal grandmother; Asthma in his mother; Coronary artery disease in his father and mother; Heart  disease in his father, maternal grandfather, maternal grandmother, paternal grandfather, and paternal grandmother; Hypertension in his maternal grandfather, maternal grandmother, mother, paternal grandfather, and paternal grandmother; Mental illness in his mother.  Laboratory Chemistry Profile   Renal Lab Results  Component Value Date   BUN 13 04/21/2017   CREATININE 0.93 04/21/2017   LABCREA 79.3 03/05/2014   GFR 82.19 08/05/2014   GFRAA >60 04/21/2017   GFRNONAA >60 04/21/2017     Hepatic Lab Results  Component Value Date   AST 30 04/21/2017   ALT 23 04/21/2017   ALBUMIN 4.2 04/21/2017   ALKPHOS 86 04/21/2017   LIPASE 51 04/21/2017     Electrolytes Lab Results  Component Value Date   NA 138 04/21/2017   K 3.7 04/21/2017   CL 101 04/21/2017   CALCIUM 9.4 04/21/2017   MG 1.9 04/26/2020     Bone Lab Results  Component Value Date   VD25OH 31.95 03/05/2016   25OHVITD1 35 04/26/2020   25OHVITD2 <1.0 04/26/2020   25OHVITD3 35 04/26/2020     Inflammation (CRP: Acute Phase) (ESR: Chronic Phase) Lab Results  Component Value Date   CRP 10 04/26/2020   ESRSEDRATE 22 04/26/2020       Note: Above Lab results reviewed.  Recent Imaging Review  DG Si Joints CLINICAL DATA:  Bilateral hip and buttock pain.  EXAM: BILATERAL SACROILIAC JOINTS - 3+ VIEW  COMPARISON:  None.  FINDINGS: Sacroiliac joints have a symmetric and normal appearance without evidence of narrowing, widening or abnormal sclerosis. No erosions or destruction identified.  IMPRESSION: Negative.  Electronically Signed   By: Aletta Edouard M.D.   On: 10/29/2020 11:53 DG HIP UNILAT W OR W/O PELVIS 2-3 VIEWS RIGHT CLINICAL DATA:  Right hip pain.  EXAM: DG HIP (WITH OR WITHOUT PELVIS) 2-3V RIGHT  COMPARISON:  None.  FINDINGS: There is no evidence of hip fracture or dislocation. There is no evidence of arthropathy or other focal bone abnormality. The visualized bony pelvis is intact and has a  normal appearance.  IMPRESSION: Negative.  Electronically Signed   By: Aletta Edouard M.D.   On: 10/29/2020 11:50 DG HIP UNILAT W OR W/O PELVIS 2-3 VIEWS LEFT CLINICAL DATA:  Left hip pain.  EXAM: DG HIP (WITH OR WITHOUT PELVIS) 2-3V LEFT  COMPARISON:  None.  FINDINGS: There is no evidence of hip fracture or dislocation. There is no evidence of arthropathy or other focal bone abnormality.  IMPRESSION: Negative.  Electronically Signed   By: Aletta Edouard M.D.   On: 10/29/2020 11:48  Note: Reviewed        Physical Exam  General appearance: Well nourished, well developed, and well hydrated. In no apparent acute distress Mental status: Alert, oriented x 3 (person, place, & time)       Respiratory: No evidence of acute respiratory distress Eyes: PERLA Vitals: BP 133/85    Pulse (!) 105    Temp (!) 97.1 F (36.2 C)  Resp 18    Ht 6' 2"  (1.88 m)    Wt (!) 312 lb (141.5 kg)    SpO2 100%    BMI 40.06 kg/m  BMI: Estimated body mass index is 40.06 kg/m as calculated from the following:   Height as of this encounter: 6' 2"  (1.88 m).   Weight as of this encounter: 312 lb (141.5 kg). Ideal: Ideal body weight: 82.2 kg (181 lb 3.5 oz) Adjusted ideal body weight: 105.9 kg (233 lb 8.5 oz)  Cervical Spine Area Exam  Skin & Axial Inspection: Well healed scar from previous spine surgery detected Alignment: Symmetrical Functional ROM: Pain restricted ROM, bilaterally Stability: No instability detected Muscle Tone/Strength: Functionally intact. No obvious neuro-muscular anomalies detected. Sensory (Neurological): Dermatomal pain pattern and neurogenic Palpation: No palpable anomalies               Upper Extremity (UE) Exam      Side: Right upper extremity   Side: Left upper extremity  Skin & Extremity Inspection: Skin color, temperature, and hair growth are WNL. No peripheral edema or cyanosis. No masses, redness, swelling, asymmetry, or associated skin lesions. No contractures.    Skin & Extremity Inspection: Skin color, temperature, and hair growth are WNL. No peripheral edema or cyanosis. No masses, redness, swelling, asymmetry, or associated skin lesions. No contractures.  Functional ROM: Pain restricted ROM for shoulder and elbow   Functional ROM: Pain restricted ROM for shoulder and elbow  Muscle Tone/Strength: Functionally intact. No obvious neuro-muscular anomalies detected.   Muscle Tone/Strength: Functionally intact. No obvious neuro-muscular anomalies detected.  Sensory (Neurological): Unimpaired           Sensory (Neurological): Unimpaired          Palpation: No palpable anomalies               Palpation: No palpable anomalies              Provocative Test(s):  Phalen's test: deferred Tinel's test: deferred Apley's scratch test (touch opposite shoulder):  Action 1 (Across chest): Decreased ROM Action 2 (Overhead): Decreased ROM Action 3 (LB reach): Decreased ROM       Provocative Test(s):  Phalen's test: deferred Tinel's test: deferred Apley's scratch test (touch opposite shoulder):  Action 1 (Across chest): Decreased ROM Action 2 (Overhead): Decreased ROM Action 3 (LB reach): Decreased ROM     Left abdominal pain tender to palpation no rebound or guarding   Assessment   Status Diagnosis  Persistent Persistent Persistent 1. Thoracic radiculopathy   2. Myelopathy of cervical spinal cord with cervical radiculopathy (HCC)   3. Failed cervical fusion   4. Bilateral occipital neuralgia   5. Spondylosis, cervical, with myelopathy   6. Abdominal pain, left lower quadrant   7. Bilateral hip pain   8. Chronic pain syndrome   9. Migraine without aura and without status migrainosus, not intractable       Plan of Care  Mr. CASTEN FLOREN has a current medication list which includes the following long-term medication(s): carbidopa-levodopa, carbidopa-levodopa, clonazepam, duloxetine, metformin, nortriptyline, pregabalin, propranolol,  glipizide-metformin, [START ON 05/25/2021] hydrocodone-acetaminophen, [START ON 06/24/2021] hydrocodone-acetaminophen, and sucralfate.  Pharmacotherapy (Medications Ordered): Meds ordered this encounter  Medications   HYDROcodone Bitartrate ER 10 MG CP12    Sig: Take 10 mg by mouth every 12 (twelve) hours. Must last 30 days.    Dispense:  60 capsule    Refill:  0    Chronic Pain: STOP Act (Not applicable) Fill 1 day  early if closed on refill date. Avoid benzodiazepines within 8 hours of opioids   HYDROcodone Bitartrate ER 10 MG CP12    Sig: Take 10 mg by mouth every 12 (twelve) hours. Must last 30 days.    Dispense:  60 capsule    Refill:  0    Chronic Pain: STOP Act (Not applicable) Fill 1 day early if closed on refill date. Avoid benzodiazepines within 8 hours of opioids   HYDROcodone Bitartrate ER 10 MG CP12    Sig: Take 10 mg by mouth every 12 (twelve) hours. Must last 30 days.    Dispense:  60 capsule    Refill:  0    Chronic Pain: STOP Act (Not applicable) Fill 1 day early if closed on refill date. Avoid benzodiazepines within 8 hours of opioids   DULoxetine (CYMBALTA) 60 MG capsule    Sig: Take 1 capsule (60 mg total) by mouth daily after breakfast.    Dispense:  30 capsule    Refill:  5   Ubrogepant (UBRELVY) 100 MG TABS    Sig: 50 to 100 mg (0.5 - 1 tablet) as a single dose; if symptoms persist or return, may repeat dose after =2 hours. Do not exceed 200 mg daily dose    Dispense:  30 tablet    Refill:  1   HYDROcodone-acetaminophen (NORCO) 10-325 MG tablet    Sig: Take 1 tablet by mouth every 8 (eight) hours as needed. For chronic pain syndrome. Each Rx to last 30 days.    Dispense:  90 tablet    Refill:  0   HYDROcodone-acetaminophen (NORCO) 10-325 MG tablet    Sig: Take 1 tablet by mouth every 8 (eight) hours as needed. For chronic pain syndrome. Each Rx to last 30 days.    Dispense:  90 tablet    Refill:  0    Follow-up plan:   Return in about 3 months (around  07/12/2021) for Medication Management, in person.     Diagnostic cervical facet medial branch nerve blocks C3-C7 b/l 05/06/19- did not help Diagnostic thoracic facet medial branch nerve blocks T1-T2, consider bilateral occipital nerve block  Cervical/trapezius trigger point injections SPG block                 Recent Visits Date Type Provider Dept  01/19/21 Office Visit Gillis Santa, MD [facility]  Showing recent visits within past 90 days and meeting all other requirements Today's Visits Date Type Provider Dept  04/13/21 Office Visit Gillis Santa, MD [facility]  Showing today's visits and meeting all other requirements Future Appointments Date Type Provider Dept  07/04/21 Appointment Gillis Santa, MD [facility]  Showing future appointments within next 90 days and meeting all other requirements  I discussed the assessment and treatment plan with the patient. The patient was provided an opportunity to ask questions and all were answered. The patient agreed with the plan and demonstrated an understanding of the instructions.  Patient advised to call back or seek an in-person evaluation if the symptoms or condition worsens.  Duration of encounter: 30 minutes.  Note by: Gillis Santa, MD Date: 04/13/2021; Time: 2:26 PM

## 2021-04-14 ENCOUNTER — Telehealth: Payer: Self-pay

## 2021-04-14 NOTE — Telephone Encounter (Signed)
[employer] called the medication has been approved good through one year. He notified the patient.

## 2021-04-25 DIAGNOSIS — E119 Type 2 diabetes mellitus without complications: Secondary | ICD-10-CM | POA: Insufficient documentation

## 2021-04-27 ENCOUNTER — Emergency Department
Admission: EM | Admit: 2021-04-27 | Discharge: 2021-04-28 | Disposition: A | Discharge status: Home / Self Care | Payer: Medicare Other | Attending: Emergency Medicine | Admitting: Emergency Medicine

## 2021-04-27 ENCOUNTER — Encounter: Payer: Self-pay | Admitting: *Deleted

## 2021-04-27 ENCOUNTER — Other Ambulatory Visit: Payer: Self-pay

## 2021-04-27 DIAGNOSIS — Z79899 Other long term (current) drug therapy: Secondary | ICD-10-CM | POA: Insufficient documentation

## 2021-04-27 DIAGNOSIS — Z7984 Long term (current) use of oral hypoglycemic drugs: Secondary | ICD-10-CM | POA: Insufficient documentation

## 2021-04-27 DIAGNOSIS — G8929 Other chronic pain: Secondary | ICD-10-CM | POA: Diagnosis not present

## 2021-04-27 DIAGNOSIS — J4520 Mild intermittent asthma, uncomplicated: Secondary | ICD-10-CM | POA: Insufficient documentation

## 2021-04-27 DIAGNOSIS — I10 Essential (primary) hypertension: Secondary | ICD-10-CM | POA: Insufficient documentation

## 2021-04-27 DIAGNOSIS — E119 Type 2 diabetes mellitus without complications: Secondary | ICD-10-CM | POA: Diagnosis not present

## 2021-04-27 DIAGNOSIS — M545 Low back pain, unspecified: Secondary | ICD-10-CM | POA: Insufficient documentation

## 2021-04-27 DIAGNOSIS — M48061 Spinal stenosis, lumbar region without neurogenic claudication: Secondary | ICD-10-CM

## 2021-04-27 MED ORDER — DIAZEPAM 5 MG/ML IJ SOLN
2.5000 mg | Freq: Once | INTRAMUSCULAR | Status: AC
  Administered 2021-04-28: 2.5 mg via INTRAVENOUS
  Filled 2021-04-27: qty 2

## 2021-04-27 MED ORDER — HYDROMORPHONE HCL 1 MG/ML IJ SOLN
1.0000 mg | Freq: Once | INTRAMUSCULAR | Status: AC
  Administered 2021-04-28: 1 mg via INTRAVENOUS
  Filled 2021-04-27: qty 1

## 2021-04-27 NOTE — ED Provider Notes (Signed)
[facility] Provider Note    Event Date/Time   First MD Initiated Contact with Patient 04/27/21 2312     (approximate)   History   Back Pain   HPI  Thomas Cole is a 51 y.o. male brought to the ED via EMS from home with a chief complaint of acute on chronic lower back pain, nontraumatic.  Patient has a history of thoracic radiculopathy for which she sees pain clinic, myelopathy of cervical spinal cord, chronic pain syndrome, thoracic facet joint syndrome, status post lumbar fusion who reports progressive lower back pain x1 week.  Patient leaned over last night and felt sharp pain in his lower back.  Reports urinary incontinence; describes it as he could not make it to the bathroom.  Initially felt like he could not empty his bladder but then urinated a second time and felt like he urinated completely.  Denies radiation to his legs, extremity weakness, numbness or tingling.  Denies recent fever, cough, chest pain, shortness of breath, abdominal pain.     Past Medical History   Past Medical History:  Diagnosis Date   Anxiety    Asthma    Back problem    Benign essential hypertension 11/30/2016   Cervical spondylosis without myelopathy 9/51/8841   Complication of anesthesia    occ takes longer to wake   Depression    Diabetes mellitus without complication ([place]) 66/0630   Type 2   GERD (gastroesophageal reflux disease)    Hypertension    Labile hypertension 03/19/2013   No current neuro changes, headache much improved. No clear sign of intracranial bleed. NO indication for head CT>  BP much better now... Fluctuates a lot.  Will eval for pheochromocytoma. As recommended at last cardiology OV.. Will prescribe hydralazine to use as needed for BP fluctuations.  Close follow up with PCP.    Major depressive disorder, recurrent episode, severe ([place]) 06/23/2015   Migraine headache    Migraines 11/11/2013   S/P appendectomy 04/09/2011   Seizure ([place])  11/30/2016   Seizures ([place])    last 87   Shortness of breath    SOB (shortness of breath) 08/06/2014   - [facility] eval with cpst 02/20/2008 exercise testing demonstrateed normal functional capabilities and a normal cardiopulmonary response to exercise. - 09/16/2014  Walked RA x 3 laps @ 185 ft each stopped due to end of study/ nl pace/ no desat  - spirometry 09/16/2014 > no obst/ min restriction        Active Problem List   Patient Active Problem List   Diagnosis Date Noted   Thoracic facet joint syndrome 09/24/2019   Chronic pain syndrome 07/14/2019   Failed cervical fusion 11/20/2018   Episode of recurrent major depressive disorder ([place]) 11/20/2018   Myelopathy of cervical spinal cord with cervical radiculopathy ([facility]) 05/13/2018   Bilateral occipital neuralgia 02/11/2018   Degeneration of cervical intervertebral disc with myelopathy 06/13/2017   H/O cervical spinal arthrodesis 05/16/2017   Thoracic radiculopathy 05/16/2017   Acute cholecystitis 03/29/2017   Anxiety 11/30/2016   Benign essential hypertension 11/30/2016   Seizure ([place]) 11/30/2016   Localization-related symptomatic epilepsy and epileptic syndromes with complex partial seizures, not intractable, without status epilepticus ([place]) 02/21/2016   Generalized anxiety disorder 06/23/2015   Major depressive disorder, recurrent episode, severe ([place]) 06/23/2015   PTSD (post-traumatic stress disorder) 06/16/2015   SOB (shortness of breath) 08/06/2014   Obesity (BMI 30-39.9) 11/11/2013   Migraines 11/11/2013   Asthma, mild intermittent 11/11/2013  History of seizures 11/11/2013   GERD without esophagitis 11/11/2013   Depression 11/11/2013   Labile hypertension 03/19/2013   Spondylosis, cervical, with myelopathy 09/17/2012   S/P appendectomy 04/09/2011   Sleep apnea 05/25/2010     Past Surgical History   Past Surgical History:  Procedure Laterality Date   ANTERIOR CERVICAL CORPECTOMY N/A  09/15/2012   Procedure: Cervical Three-Four,Cervical Six-Seven Anterior cervical decompression/diskectomy fusion with Cervical Four Corpectomy;  Surgeon: Kristeen Miss, MD;  Location: [facility];  Service: Neurosurgery;  Laterality: N/A;  Cervical Three-Four,Cervical Six-Seven  Anterior cervical decompression/diskectomy fusion with Cervical four Corpectomy   APPENDECTOMY  12   CERVICAL DISC SURGERY     CHOLECYSTECTOMY N/A 03/29/2017   Procedure: LAPAROSCOPIC CHOLECYSTECTOMY;  Surgeon: Florene Glen, MD;  Location: [facility] ORS;  Service: General;  Laterality: N/A;   COLONOSCOPY WITH PROPOFOL N/A 10/23/2019   Procedure: COLONOSCOPY WITH PROPOFOL;  Surgeon: Robert Bellow, MD;  Location: [facility];  Service: Endoscopy;  Laterality: N/A;   ESOPHAGOGASTRODUODENOSCOPY (EGD) WITH PROPOFOL N/A 10/23/2019   Procedure: ESOPHAGOGASTRODUODENOSCOPY (EGD) WITH PROPOFOL;  Surgeon: Robert Bellow, MD;  Location: [facility] END[facility];  Service: Endoscopy;  Laterality: N/A;   KNEE SURGERY     bilateral   POSTERIOR FUSION CERVICAL SPINE       Home Medications   Prior to Admission medications   Medication Sig Start Date End Date Taking? Authorizing Provider  butalbital-acetaminophen-caffeine (FIORICET) 50-325-40 MG tablet Take 1 tablet by mouth 2 (two) times daily as needed for headache or migraine. 01/26/20   Gillis Santa, MD  carbidopa-levodopa (SINEMET CR) 50-200 MG tablet Take 1 tablet by mouth at bedtime.    [provider]  carbidopa-levodopa (SINEMET IR) 25-100 MG tablet TAKE 2 TABLETS BY MOUTH 3 (THREE) TIMES DAILY 06/25/18   [provider]  Cholecalciferol (VITAMIN D3) 25 MCG (1000 UT) CAPS Take by mouth.    [provider]  clonazePAM (KLONOPIN) 1 MG tablet Take 1 mg by mouth at bedtime as needed. 04/10/19   [provider]  DULoxetine (CYMBALTA) 60 MG capsule Take 1 capsule (60 mg total) by mouth daily after breakfast. 04/13/21 10/10/21  Gillis Santa, MD   entacapone (COMTAN) 200 MG tablet Take 200 mg by mouth at bedtime. 11/24/19   [provider]  furosemide (LASIX) 20 MG tablet Take by mouth. 04/13/21 04/13/22  [provider]  glipizide-metformin (METAGLIP) 2.5-250 MG tablet Take 1 tablet by mouth 1 day or 1 dose. Patient not taking: Reported on 01/23/2021    [provider]  hydrochlorothiazide (HYDRODIURIL) 25 MG tablet Take 25 mg by mouth daily. 07/03/18   [provider]  HYDROcodone Bitartrate ER 10 MG CP12 Take 10 mg by mouth every 12 (twelve) hours. Must last 30 days. 04/26/21 05/26/21  Gillis Santa, MD  HYDROcodone Bitartrate ER 10 MG CP12 Take 10 mg by mouth every 12 (twelve) hours. Must last 30 days. 05/26/21 06/25/21  Gillis Santa, MD  HYDROcodone Bitartrate ER 10 MG CP12 Take 10 mg by mouth every 12 (twelve) hours. Must last 30 days. 06/25/21 07/25/21  Gillis Santa, MD  HYDROcodone-acetaminophen (NORCO) 10-325 MG tablet Take 1 tablet by mouth every 8 (eight) hours as needed. For chronic pain syndrome. Each Rx to last 30 days. 05/25/21 06/24/21  Gillis Santa, MD  HYDROcodone-acetaminophen (NORCO) 10-325 MG tablet Take 1 tablet by mouth every 8 (eight) hours as needed. For chronic pain syndrome. Each Rx to last 30 days. 06/24/21 07/24/21  Gillis Santa, MD  losartan (COZAAR)  100 MG tablet Take 100 mg by mouth daily. 09/23/19   [provider]  metFORMIN (GLUCOPHAGE) 500 MG tablet Take 500 mg by mouth daily with breakfast.    [provider]  nortriptyline (PAMELOR) 25 MG capsule Take 2 capsules (50 mg total) by mouth at bedtime. 01/19/21   Gillis Santa, MD  omeprazole (PRILOSEC) 40 MG capsule Take 1 capsule by mouth 2 (two) times daily. 06/16/20   [provider]  phenytoin (DILANTIN) 100 MG ER capsule Take by mouth. 07/13/20   [provider]  pregabalin (LYRICA) 100 MG capsule Take 1 capsule (100 mg total) by mouth 3 (three) times daily. 01/19/21 07/18/21  Gillis Santa, MD   propranolol (INDERAL) 40 MG tablet Take 40 mg by mouth 2 (two) times daily. Currently takes 1/2 tablet once per day 10/15/19   [provider]  sucralfate (CARAFATE) 1 g tablet Take 1 g by mouth 4 (four) times daily. Patient not taking: Reported on 01/19/2021 10/02/19   [provider]  tiZANidine (ZANAFLEX) 4 MG tablet Take 1 tablet (4 mg total) by mouth every 8 (eight) hours as needed for muscle spasms. 01/19/21   Gillis Santa, MD  Ubrogepant (UBRELVY) 100 MG TABS 50 to 100 mg (0.5 - 1 tablet) as a single dose; if symptoms persist or return, may repeat dose after =2 hours. Do not exceed 200 mg daily dose 04/13/21   Gillis Santa, MD  vitamin B-12 (CYANOCOBALAMIN) 1000 MCG tablet Take by mouth.    [provider]     Allergies  Amitriptyline, Bee venom, Chlorhexidine, Carbamazepine, Meloxicam, Morphine and related, and Sulfa antibiotics   Family History   Family History  Problem Relation Age of Onset   Coronary artery disease Mother    Hypertension Mother    Mental illness Mother    Arthritis Mother    Asthma Mother    Coronary artery disease Father    Heart disease Father    Arthritis Father    Arthritis Maternal Grandmother    Heart disease Maternal Grandmother    Hypertension Maternal Grandmother    Arthritis Maternal Grandfather    Heart disease Maternal Grandfather    Hypertension Maternal Grandfather    Arthritis Paternal Grandmother    Heart disease Paternal Grandmother    Hypertension Paternal Grandmother    Arthritis Paternal Grandfather    Heart disease Paternal Grandfather    Hypertension Paternal Grandfather    Cancer Neg Hx    Diabetes Neg Hx    Stroke Neg Hx      Physical Exam  Triage Vital Signs: ED Triage Vitals  Enc Vitals Group     BP 04/27/21 2302 (!) 147/84     Pulse Rate 04/27/21 2302 96     Resp 04/27/21 2302 18     Temp 04/27/21 2302 98.6 F (37 C)     Temp Source 04/27/21 2302 Oral     SpO2  04/27/21 2302 95 %     Weight 04/27/21 2248 (!) 313 lb (142 kg)     Height 04/27/21 2248 6\' 2"  (1.88 m)     Head Circumference --      Peak Flow --      Pain Score 04/27/21 2248 10     Pain Loc --      Pain Edu? --      Excl. in [place]? --     Updated Vital Signs: BP (!) 147/84 (BP Location: Right Arm)    Pulse 96  Temp 98.6 F (37 C) (Oral)    Resp 18    Ht 6\' 2"  (1.88 m)    Wt (!) 142 kg    SpO2 95%    BMI 40.19 kg/m    General: Awake, mild distress.  CV:  Good peripheral perfusion.  Resp:  Normal effort.  Abd:  Nontender to light or deep palpation.  No distention.  Other:  Midline lower thoracic/lumbar spine tenderness to palpation.  No step-offs or deformities noted.  No warmth, erythema or fluctuance. + Paraspinal muscle spasms. + Straight leg raise on the left.  2+ femoral and distal pulses.  5/5 motor strength and sensation.   ED Results / Procedures / Treatments  Labs (all labs ordered are listed, but only abnormal results are displayed) Labs Reviewed  URINALYSIS, ROUTINE W REFLEX MICROSCOPIC - Abnormal; Notable for the following components:      Result Value   Glucose, UA 250 (*)    Ketones, ur TRACE (*)    All other components within normal limits     EKG  None   RADIOLOGY I have personally reviewed patient's MRI thoracic and lumbar spine as well as the radiology interpretation:  MRI thoracic spine: No thoracic spinal canal stenosis  MRI lumbar spine: Severe spinal canal stenosis L2-L5  Official radiology report(s): MR THORACIC SPINE WO CONTRAST  Result Date: 04/28/2021 CLINICAL DATA:  Low back pain EXAM: MRI THORACIC AND LUMBAR SPINE WITHOUT CONTRAST TECHNIQUE: Multiplanar and multiecho pulse sequences of the thoracic and lumbar spine were obtained without intravenous contrast. COMPARISON:  05/13/2017 MRI thoracic spine FINDINGS: MRI THORACIC SPINE FINDINGS Alignment:  Physiologic. Vertebrae: T2 hemangioma.  No acute lesion. Cord:  Normal Paraspinal and  other soft tissues: Negative Disc levels: T3-4: Small central disc protrusion without stenosis. The other thoracic levels are normal. MRI LUMBAR SPINE FINDINGS Segmentation:  Standard. Alignment:  Grade 1 retrolisthesis at L2-3, L3-4, L4-5 and L5-S1. Vertebrae:  No fracture, evidence of discitis, or bone lesion. Conus medullaris and cauda equina: Conus extends to the L1 level. Conus and cauda equina appear normal. Paraspinal and other soft tissues: Negative. Disc levels: L1-L2: Normal disc space and facet joints. No spinal canal stenosis. No neural foraminal stenosis. L2-L3: Small disc bulge and congenitally narrow spinal canal. Severe spinal canal stenosis. No neural foraminal stenosis. L3-L4: Small disc bulge with congenitally narrow spinal canal. Severe spinal canal stenosis. No neural foraminal stenosis. L4-L5: Small left asymmetric disc bulge with congenitally narrow spinal canal. Severe spinal canal stenosis. Moderate right and severe left neural foraminal stenosis. L5-S1: Small disc bulge. No spinal canal stenosis. Moderate left neural foraminal stenosis. Visualized sacrum: Normal. IMPRESSION: 1. Severe spinal canal stenosis at the L2-L5 levels due to combination of disc bulges and congenitally narrow spinal canal. 2. Moderate right and severe left neural foraminal stenosis at L4-5 and moderate left neural foraminal stenosis at L5-S1. 3. No thoracic spinal canal or neural foraminal stenosis. Electronically Signed   By: Ulyses Jarred M.D.   On: 04/28/2021 01:37   MR LUMBAR SPINE WO CONTRAST  Result Date: 04/28/2021 CLINICAL DATA:  Low back pain EXAM: MRI THORACIC AND LUMBAR SPINE WITHOUT CONTRAST TECHNIQUE: Multiplanar and multiecho pulse sequences of the thoracic and lumbar spine were obtained without intravenous contrast. COMPARISON:  05/13/2017 MRI thoracic spine FINDINGS: MRI THORACIC SPINE FINDINGS Alignment:  Physiologic. Vertebrae: T2 hemangioma.  No acute lesion. Cord:  Normal Paraspinal and other  soft tissues: Negative Disc levels: T3-4: Small central disc protrusion without stenosis. The other thoracic  levels are normal. MRI LUMBAR SPINE FINDINGS Segmentation:  Standard. Alignment:  Grade 1 retrolisthesis at L2-3, L3-4, L4-5 and L5-S1. Vertebrae:  No fracture, evidence of discitis, or bone lesion. Conus medullaris and cauda equina: Conus extends to the L1 level. Conus and cauda equina appear normal. Paraspinal and other soft tissues: Negative. Disc levels: L1-L2: Normal disc space and facet joints. No spinal canal stenosis. No neural foraminal stenosis. L2-L3: Small disc bulge and congenitally narrow spinal canal. Severe spinal canal stenosis. No neural foraminal stenosis. L3-L4: Small disc bulge with congenitally narrow spinal canal. Severe spinal canal stenosis. No neural foraminal stenosis. L4-L5: Small left asymmetric disc bulge with congenitally narrow spinal canal. Severe spinal canal stenosis. Moderate right and severe left neural foraminal stenosis. L5-S1: Small disc bulge. No spinal canal stenosis. Moderate left neural foraminal stenosis. Visualized sacrum: Normal. IMPRESSION: 1. Severe spinal canal stenosis at the L2-L5 levels due to combination of disc bulges and congenitally narrow spinal canal. 2. Moderate right and severe left neural foraminal stenosis at L4-5 and moderate left neural foraminal stenosis at L5-S1. 3. No thoracic spinal canal or neural foraminal stenosis. Electronically Signed   By: Ulyses Jarred M.D.   On: 04/28/2021 01:37     PROCEDURES:  Critical Care performed: No  Procedures   MEDICATIONS ORDERED IN ED: Medications  HYDROmorphone (DILAUDID) injection 1 mg (has no administration in time range)  lidocaine (LIDODERM) 5 % 1 patch (has no administration in time range)  HYDROmorphone (DILAUDID) injection 1 mg (1 mg Intravenous Given 04/28/21 0004)  diazepam (VALIUM) injection 2.5 mg (2.5 mg Intravenous Given 04/28/21 0013)     IMPRESSION / MDM / ASSESSMENT AND  PLAN / ED COURSE  I reviewed the triage vital signs and the nursing notes.                             51 year old male presenting with acute on chronic lower back pain.  Differential diagnosis includes but is not limited to strain, degenerative disc disease, cauda equina syndrome, infection, etc.  I have personally reviewed patient's records and see that he had a pain medicine office visit on 04/13/2021 for thoracic radiculopathy.  His Ubrogepant dose was increased from 50 to 100 mg.  Will obtain UA, administer IV Dilaudid for pain paired with IV Valium for muscle relaxation as well as anxiolysis for MRI.  Obtain MRI thoracic and lumbar spine.  Will reassess.  Clinical Course as of 04/28/21 0151  Fri Apr 28, 2021  0150 MRI thoracic and lumbar spine does not show acute cauda equina syndrome; demonstrate spinal stenosis.  Patient was feeling better after IV Dilaudid and Valium; will redose Dilaudid.  He is under the care of of the pain clinic and receives hydrocodone.  We will add prescription for Valium, lidocaine patch and patient will follow closely with his pain doctor.  Strict return precautions given.  Patient and family member verbalized understanding and agree with plan of care. [JS]    Clinical Course User Index [JS] Paulette Blanch, MD     FINAL CLINICAL IMPRESSION(S) / ED DIAGNOSES   Final diagnoses:  Chronic midline low back pain without sciatica  Acute midline low back pain without sciatica     Rx / DC Orders   ED Discharge Orders     None        Note:  This document was prepared using Dragon voice recognition software and may include unintentional dictation errors.  Paulette Blanch, MD 04/28/21 [phone]

## 2021-04-27 NOTE — ED Triage Notes (Signed)
Pt brought in by ems from home with lower back pain.  Hx chronic back pain.  Pt leaned over last night and felt pain in lower back  pt alert  speech clear.

## 2021-04-28 ENCOUNTER — Emergency Department: Payer: Medicare Other

## 2021-04-28 LAB — URINALYSIS, ROUTINE W REFLEX MICROSCOPIC
Bilirubin Urine: NEGATIVE
Glucose, UA: 250 mg/dL — AB
Hgb urine dipstick: NEGATIVE
Leukocytes,Ua: NEGATIVE
Nitrite: NEGATIVE
Protein, ur: NEGATIVE mg/dL
Specific Gravity, Urine: 1.02 (ref 1.005–1.030)
pH: 5.5 (ref 5.0–8.0)

## 2021-04-28 MED ORDER — DIAZEPAM 5 MG PO TABS: 5.0000 mg | ORAL_TABLET | Freq: Three times a day (TID) | ORAL | 0 refills | Status: DC | PRN

## 2021-04-28 MED ORDER — HYDROMORPHONE HCL 1 MG/ML IJ SOLN
1.0000 mg | Freq: Once | INTRAMUSCULAR | Status: AC
  Administered 2021-04-28: 1 mg via INTRAVENOUS
  Filled 2021-04-28: qty 1

## 2021-04-28 MED ORDER — LIDOCAINE 5 % EX PTCH
1.0000 | MEDICATED_PATCH | CUTANEOUS | Status: DC
  Administered 2021-04-28: 1 via TRANSDERMAL
  Filled 2021-04-28: qty 1

## 2021-04-28 MED ORDER — LIDOCAINE 5 % EX PTCH: 1.0000 | MEDICATED_PATCH | CUTANEOUS | 0 refills | Status: DC

## 2021-04-28 NOTE — ED Notes (Signed)
Pt was able to stand and pivot to wheelchair. Taken to [employer]. Stood at toilet and voided. Taken to MRI.

## 2021-04-28 NOTE — Discharge Instructions (Signed)
Continue your pain medicine as directed by your doctor.  Apply Lidocaine patch as directed to the affected area.  You may take Valium 3 times daily as needed for muscle spasms.  Return to the ER for worsening symptoms, persistent vomiting, difficulty breathing, losing control of your bowel or bladder, extremity weakness or other concerns.

## 2021-04-29 ENCOUNTER — Encounter: Payer: Self-pay | Admitting: Student in an Organized Health Care Education/Training Program

## 2021-05-23 ENCOUNTER — Telehealth: Payer: Self-pay

## 2021-05-23 NOTE — Telephone Encounter (Signed)
PC SW outreached patient to complete PC f/u call.  Patient stated that he was not pleased with services as he has no communication from [facility] since Nov 2022 and no longer desired the services at this time. SW stated understanding to patient.  PCP office made aware, that patient will be dc'd from [facility] services per patient request.

## 2021-07-04 ENCOUNTER — Ambulatory Visit
Payer: Medicare Other | Attending: Student in an Organized Health Care Education/Training Program | Admitting: Student in an Organized Health Care Education/Training Program

## 2021-07-04 ENCOUNTER — Encounter: Payer: Self-pay | Admitting: Student in an Organized Health Care Education/Training Program

## 2021-07-04 VITALS — BP 136/75 | HR 114 | Temp 98.1°F | Ht 74.0 in | Wt 313.0 lb

## 2021-07-04 DIAGNOSIS — G894 Chronic pain syndrome: Secondary | ICD-10-CM | POA: Insufficient documentation

## 2021-07-04 DIAGNOSIS — M5416 Radiculopathy, lumbar region: Secondary | ICD-10-CM | POA: Insufficient documentation

## 2021-07-04 DIAGNOSIS — G8929 Other chronic pain: Secondary | ICD-10-CM | POA: Diagnosis present

## 2021-07-04 DIAGNOSIS — M48062 Spinal stenosis, lumbar region with neurogenic claudication: Secondary | ICD-10-CM | POA: Diagnosis present

## 2021-07-04 DIAGNOSIS — M48061 Spinal stenosis, lumbar region without neurogenic claudication: Secondary | ICD-10-CM | POA: Insufficient documentation

## 2021-07-04 DIAGNOSIS — M5414 Radiculopathy, thoracic region: Secondary | ICD-10-CM | POA: Insufficient documentation

## 2021-07-04 DIAGNOSIS — Z9889 Other specified postprocedural states: Secondary | ICD-10-CM | POA: Diagnosis present

## 2021-07-04 DIAGNOSIS — M47894 Other spondylosis, thoracic region: Secondary | ICD-10-CM | POA: Diagnosis present

## 2021-07-04 DIAGNOSIS — G959 Disease of spinal cord, unspecified: Secondary | ICD-10-CM | POA: Insufficient documentation

## 2021-07-04 DIAGNOSIS — G43009 Migraine without aura, not intractable, without status migrainosus: Secondary | ICD-10-CM | POA: Insufficient documentation

## 2021-07-04 DIAGNOSIS — M960 Pseudarthrosis after fusion or arthrodesis: Secondary | ICD-10-CM | POA: Insufficient documentation

## 2021-07-04 DIAGNOSIS — M5412 Radiculopathy, cervical region: Secondary | ICD-10-CM | POA: Insufficient documentation

## 2021-07-04 MED ORDER — DULOXETINE HCL 60 MG PO CPEP: 60.0000 mg | ORAL_CAPSULE | Freq: Every day | ORAL | 5 refills | Status: DC

## 2021-07-04 MED ORDER — HYDROCODONE-ACETAMINOPHEN 10-325 MG PO TABS: 1.0000 | ORAL_TABLET | Freq: Three times a day (TID) | ORAL | 0 refills | Status: DC | PRN

## 2021-07-04 MED ORDER — HYDROCODONE BITARTRATE ER 10 MG PO CP12: 10.0000 mg | ORAL_CAPSULE | Freq: Two times a day (BID) | ORAL | 0 refills | Status: AC

## 2021-07-04 MED ORDER — PREGABALIN 100 MG PO CAPS: 100.0000 mg | ORAL_CAPSULE | Freq: Three times a day (TID) | ORAL | 5 refills | Status: DC

## 2021-07-04 MED ORDER — UBRELVY 100 MG PO TABS: ORAL_TABLET | ORAL | 2 refills | Status: DC

## 2021-07-04 MED ORDER — HYDROCODONE BITARTRATE ER 10 MG PO CP12: 10.0000 mg | ORAL_CAPSULE | Freq: Two times a day (BID) | ORAL | 0 refills | Status: DC

## 2021-07-04 MED ORDER — NORTRIPTYLINE HCL 25 MG PO CAPS: 50.0000 mg | ORAL_CAPSULE | Freq: Every day | ORAL | 5 refills | Status: DC

## 2021-07-04 MED ORDER — ATOGEPANT 10 MG PO TABS: 10.0000 mg | ORAL_TABLET | Freq: Every day | ORAL | 2 refills | Status: DC

## 2021-07-04 NOTE — Progress Notes (Signed)
Nursing Pain Medication Assessment:  ?Safety precautions to be maintained throughout the outpatient stay will include: orient to surroundings, keep bed in low position, maintain call bell within reach at all times, provide assistance with transfer out of bed and ambulation.  ?Medication Inspection Compliance: Pill count conducted under aseptic conditions, in front of the patient. Neither the pills nor the bottle was removed from the patient's sight at any time. Once count was completed pills were immediately returned to the patient in their original bottle. ? ?Medication: Hydrocodone/APAP ?Pill/Patch Count:  80 of 90 pills remain ?Pill/Patch Appearance: Markings consistent with prescribed medication ?Bottle Appearance: Standard pharmacy container. Clearly labeled. ?Filled Date: 4 / 1 / 2023Nursing Pain Medication Assessment:  ?Safety precautions to be maintained throughout the outpatient stay will include: orient to surroundings, keep bed in low position, maintain call bell within reach at all times, provide assistance with transfer out of bed and ambulation.  ? ?Medication:  hydrocodone ER '10mg'$  ?Pill/Patch Count:  53 of 60 pills remain ?Pill/Patch Appearance: Markings consistent with prescribed medication ?Bottle Appearance: Standard pharmacy container. Clearly labeled. ?Filled Date: 4 / 1 / 2022 ?Last Medication intake:  Today ?Last Medication intake:  TodaySafety precautions to be maintained throughout the outpatient stay will include: orient to surroundings, keep bed in low position, maintain call bell within reach at all times, provide assistance with transfer out of bed and ambulation.  ?

## 2021-07-04 NOTE — Progress Notes (Signed)
PROVIDER NOTE: Information contained herein reflects review and annotations entered in association with encounter. Interpretation of such information and data should be left to medically-trained personnel. Information provided to patient can be located elsewhere in the medical record under "Patient Instructions". Document created using STT-dictation technology, any transcriptional errors that may result from process are unintentional.  ?  ?Patient: Thomas Cole  Service Category: E/M  Provider: Gillis Santa, MD  ?DOB: 1970-08-02  DOS: 07/04/2021  Specialty: Interventional Pain Management  ?MRN: [id number]  Setting: Ambulatory outpatient  PCP: Derinda Late, MD  ?Type: Established Patient    Referring Provider: Derinda Late, MD  ?Location: Office  Delivery: Face-to-face    ? ?HPI  ?[name], a 51 y.o. year old male, is here today because of his Spinal stenosis, lumbar region, with neurogenic claudication [M48.062]. Mr. Mcmillon primary complain today is Back Pain (lower) ? ?Last encounter: My last encounter with him was on 04/13/21 ?Pertinent problems: Mr. Broady has Spondylosis, cervical, with myelopathy; Migraines; History of seizures; Generalized anxiety disorder; and Bilateral occipital neuralgia on their pertinent problem list. ?Pain Assessment: Severity of Chronic pain is reported as a 6  (neck 6)/10. Location: Back (neck) Right, Left, Lower/pain radiaties everywhere. Onset: More than a month ago. Quality: Aching, Sharp. Timing: Constant. Modifying factor(s): meds. ?Vitals:  height is '6\' 2"'$  (1.88 m) and weight is 313 lb (142 kg) (abnormal). His temperature is 98.1 ?F (36.7 ?C). His blood pressure is 136/75 and his pulse is 114 (abnormal). His oxygen saturation is 98%.  ? ?Reason for encounter: medication management.   ? ?Patient presents today for medication management.   ?Unfortunately Glendell Docker had to go to the emergency department in January for lower extremity pain as well as weakness and inability to  urinate.  They were concerned with cauda equina syndrome as this started after he coughed and felt something pop in his lower back.  Thoracic and lumbar MRI were done, results of which are below but his lumbar MRI is concerning for severe multilevel lumbar spinal canal stenosis along with neuroforaminal stenosis.  Of note patient does have a history of prior lumbar spine surgery done in 1994 which he recalls being a laminectomy and discectomy.  We discussed a lumbar epidural steroid injection to help with his lumbar radicular pain.  Risks and benefits reviewed and patient would like to proceed. ? ?He is finding benefit with Roselyn Meier for migraine management, abortive.  He states that he is having more than 15 migraine days in a given month and I recommend that he start Qulipta as a preventative. ? ?Continue Cymbalta 60 mg daily ? ?Continue Lyrica at 100 mg 3 times daily ? ?Pharmacotherapy Assessment  ?Analgesic: Hydrocodone extended release 10 mg twice daily, hydrocodone immediate release 10 mg 3 times daily as needed for breakthrough pain   ? ?Monitoring:Hydrocodone extended release 10 mg twice daily, hydrocodone immediate release 10 mg 3 times daily as needed for breakthrough pain   ? PMP: PDMP not reviewed this encounter.       ?Pharmacotherapy: No side-effects or adverse reactions reported. ?Compliance: No problems identified. ?Effectiveness: Clinically acceptable.  UDS:  ?Summary  ?Date Value Ref Range Status  ?10/26/2020 Note  Final  ?  Comment:  ?  ==================================================================== ?[facility] Select 13 (MW) ?==================================================================== ?Test                             Result       Flag  Units ? ?Drug Present and Declared for Prescription Verification ?  Hydrocodone                    349          EXPECTED   ng/mg creat ?  Hydromorphone                  95           EXPECTED   ng/mg creat ?  Dihydrocodeine                 108           EXPECTED   ng/mg creat ?  Norhydrocodone                 1578         EXPECTED   ng/mg creat ?   Sources of hydrocodone include scheduled prescription medications. ?   Hydromorphone, dihydrocodeine and norhydrocodone are expected ?   metabolites of hydrocodone. Hydromorphone and dihydrocodeine are ?   also available as scheduled prescription medications. ? ?Drug Absent but Declared for Prescription Verification ?  Clonazepam                     Not Detected UNEXPECTED ng/mg creat ?  Butalbital                     Not Detected UNEXPECTED ?==================================================================== ?Test                      Result    Flag   Units      Ref Range ?  Creatinine              65               mg/dL      >=20 ?==================================================================== ?Declared Medications: ? The flagging and interpretation on this report are based on the ? following declared medications.  Unexpected results may arise from ? inaccuracies in the declared medications. ? ? **Note: The testing scope of this panel includes these medications: ? ? Butalbital (Fioricet) ? Clonazepam (Klonopin) ? Hydrocodone ? Hydrocodone (Norco) ? ? **Note: The testing scope of this panel does not include the ? following reported medications: ? ? Acetaminophen (Fioricet) ? Acetaminophen (Norco) ? Caffeine (Fioricet) ? Carbidopa (Sinemet) ? Cyanocobalamin ? Duloxetine (Cymbalta) ? Entacapone ? Glipizide ? Hydrochlorothiazide (Hydrodiuril) ? Levodopa (Sinemet) ? Losartan (Cozaar) ? Metformin (Glucophage) ? Nortriptyline (Pamelor) ? Omeprazole (Prilosec) ? Phenytoin (Dilantin) ? Pregabalin (Lyrica) ? Propranolol (Inderal) ? Sucralfate (Carafate) ? Tizanidine (Zanaflex) ? Vitamin D3 ?==================================================================== ?For clinical consultation, please call [phone]. ?==================================================================== ?  ?  ? ? ?ROS  ?Constitutional:  Denies any fever or chills ?Gastrointestinal: No reported hemesis, hematochezia, vomiting, or acute GI distress ?Musculoskeletal:  cervicalgia  L>right hip pain; left abdominal pain, low back pain with radiation into left leg ?Neurological: No reported episodes of acute onset apraxia, aphasia, dysarthria, agnosia, amnesia, paralysis, loss of coordination, or loss of consciousness ? ?Medication Review  ?Atogepant, DULoxetine, HYDROcodone Bitartrate ER, HYDROcodone-acetaminophen, Ubrogepant, Vitamin D3, butalbital-acetaminophen-caffeine, carbidopa-levodopa, clonazePAM, diazepam, entacapone, furosemide, glipiZIDE, glipizide-metformin, hydrochlorothiazide, lidocaine, losartan, metFORMIN, nortriptyline, omeprazole, phenytoin, pregabalin, propranolol, sucralfate, tiZANidine, and vitamin B-12 ? ?History Review  ?Allergy: Mr. Chestnut is allergic to amitriptyline, bee venom, chlorhexidine, carbamazepine, meloxicam, morphine and related, and sulfa antibiotics. ?Drug: Mr. Calixte  reports no history of drug use. ?Alcohol:  reports no history  of alcohol use. ?Tobacco:  reports that he has never smoked. He has never used smokeless tobacco. ?Social: Mr. Poffenberger  reports that he has never smoked. He has never used smokeless tobacco. He reports that he does not drink alcohol and does not use drugs. ?Medical:  has a past medical history of Anxiety, Asthma, Back problem, Benign essential hypertension (11/30/2016), Cervical spondylosis without myelopathy (2/81/1886), Complication of anesthesia, Depression, Diabetes mellitus without complication ([place]) (77/3736), GERD (gastroesophageal reflux disease), Hypertension, Labile hypertension (03/19/2013), Major depressive disorder, recurrent episode, severe ([place]) (06/23/2015), Migraine headache, Migraines (11/11/2013), S/P appendectomy (04/09/2011), Seizure ([place]) (11/30/2016), Seizures ([place]), Shortness of breath, and SOB (shortness of breath) (08/06/2014). ?Surgical: Mr. Molinari  has a past surgical  history that includes Knee surgery; Posterior fusion cervical spine; Cervical disc surgery; Appendectomy (12); Anterior cervical corpectomy (N/A, 09/15/2012); Cholecystectomy (N/A, 03/29/2017); Colonoscopy w

## 2021-07-06 ENCOUNTER — Telehealth: Payer: Self-pay

## 2021-07-06 DIAGNOSIS — G894 Chronic pain syndrome: Secondary | ICD-10-CM

## 2021-07-06 DIAGNOSIS — G43009 Migraine without aura, not intractable, without status migrainosus: Secondary | ICD-10-CM

## 2021-07-06 MED ORDER — UBRELVY 100 MG PO TABS: ORAL_TABLET | ORAL | 2 refills | Status: DC

## 2021-07-06 NOTE — Telephone Encounter (Signed)
Dr Holley Raring, I received a call from patients insurance company.  The Ubrelvy, he can only get 16 per month instead of 30.  Can  you resend for 16 per month.  The Qlipta was denied because he has not tried Aimovig or Nurtec.   ?

## 2021-07-10 ENCOUNTER — Encounter: Payer: Self-pay | Admitting: Student in an Organized Health Care Education/Training Program

## 2021-07-11 ENCOUNTER — Other Ambulatory Visit: Payer: Self-pay | Admitting: *Deleted

## 2021-07-11 DIAGNOSIS — G894 Chronic pain syndrome: Secondary | ICD-10-CM

## 2021-07-11 DIAGNOSIS — G43009 Migraine without aura, not intractable, without status migrainosus: Secondary | ICD-10-CM

## 2021-07-11 MED ORDER — UBRELVY 100 MG PO TABS: ORAL_TABLET | ORAL | 2 refills | Status: DC

## 2021-07-11 NOTE — Telephone Encounter (Signed)
Message sent to BL ?

## 2021-07-24 ENCOUNTER — Encounter: Payer: Self-pay | Admitting: Student in an Organized Health Care Education/Training Program

## 2021-07-24 ENCOUNTER — Other Ambulatory Visit: Payer: Self-pay

## 2021-07-24 ENCOUNTER — Ambulatory Visit
Admission: RE | Admit: 2021-07-24 | Discharge: 2021-07-24 | Disposition: A | Discharge status: Home / Self Care | Payer: Medicare Other | Source: Ambulatory Visit | Attending: Student in an Organized Health Care Education/Training Program | Admitting: Student in an Organized Health Care Education/Training Program

## 2021-07-24 ENCOUNTER — Ambulatory Visit (HOSPITAL_BASED_OUTPATIENT_CLINIC_OR_DEPARTMENT_OTHER): Payer: Medicare Other | Admitting: Student in an Organized Health Care Education/Training Program

## 2021-07-24 VITALS — BP 142/94 | HR 96 | Temp 97.9°F | Resp 17 | Ht 74.0 in | Wt 312.0 lb

## 2021-07-24 DIAGNOSIS — M5416 Radiculopathy, lumbar region: Secondary | ICD-10-CM | POA: Diagnosis present

## 2021-07-24 DIAGNOSIS — G894 Chronic pain syndrome: Secondary | ICD-10-CM | POA: Diagnosis not present

## 2021-07-24 DIAGNOSIS — G8929 Other chronic pain: Secondary | ICD-10-CM | POA: Insufficient documentation

## 2021-07-24 DIAGNOSIS — M48061 Spinal stenosis, lumbar region without neurogenic claudication: Secondary | ICD-10-CM | POA: Insufficient documentation

## 2021-07-24 MED ORDER — DEXAMETHASONE SODIUM PHOSPHATE 10 MG/ML IJ SOLN
10.0000 mg | Freq: Once | INTRAMUSCULAR | Status: AC
  Administered 2021-07-24: 10 mg

## 2021-07-24 MED ORDER — SODIUM CHLORIDE (PF) 0.9 % IJ SOLN
INTRAMUSCULAR | Status: AC
  Filled 2021-07-24: qty 10

## 2021-07-24 MED ORDER — DEXAMETHASONE SODIUM PHOSPHATE 10 MG/ML IJ SOLN
INTRAMUSCULAR | Status: AC
  Filled 2021-07-24: qty 1

## 2021-07-24 MED ORDER — ROPIVACAINE HCL 2 MG/ML IJ SOLN
2.0000 mL | Freq: Once | INTRAMUSCULAR | Status: AC
  Administered 2021-07-24: 20 mL via EPIDURAL

## 2021-07-24 MED ORDER — ROPIVACAINE HCL 2 MG/ML IJ SOLN
INTRAMUSCULAR | Status: AC
  Filled 2021-07-24: qty 20

## 2021-07-24 MED ORDER — DIAZEPAM 5 MG PO TABS
ORAL_TABLET | ORAL | Status: AC
  Filled 2021-07-24: qty 1

## 2021-07-24 MED ORDER — LIDOCAINE HCL 2 % IJ SOLN
20.0000 mL | Freq: Once | INTRAMUSCULAR | Status: AC
  Administered 2021-07-24: 400 mg

## 2021-07-24 MED ORDER — SODIUM CHLORIDE 0.9% FLUSH
2.0000 mL | Freq: Once | INTRAVENOUS | Status: AC
  Administered 2021-07-24: 10 mL

## 2021-07-24 MED ORDER — IOHEXOL 180 MG/ML  SOLN
INTRAMUSCULAR | Status: AC
  Filled 2021-07-24: qty 20

## 2021-07-24 MED ORDER — LIDOCAINE HCL 2 % IJ SOLN
INTRAMUSCULAR | Status: AC
  Filled 2021-07-24: qty 20

## 2021-07-24 MED ORDER — DIAZEPAM 5 MG PO TABS
10.0000 mg | ORAL_TABLET | ORAL | Status: AC
  Administered 2021-07-24: 10 mg via ORAL

## 2021-07-24 MED ORDER — IOHEXOL 180 MG/ML  SOLN
10.0000 mL | Freq: Once | INTRAMUSCULAR | Status: AC
  Administered 2021-07-24: 10 mL via EPIDURAL

## 2021-07-24 NOTE — Progress Notes (Signed)
Safety precautions to be maintained throughout the outpatient stay will include: orient to surroundings, keep bed in low position, maintain call bell within reach at all times, provide assistance with transfer out of bed and ambulation.  

## 2021-07-24 NOTE — Patient Instructions (Signed)

## 2021-07-24 NOTE — Progress Notes (Signed)
PROVIDER NOTE: Interpretation of information contained herein should be left to medically-trained personnel. Specific patient instructions are provided elsewhere under "Patient Instructions" section of medical record. This document was created in part using STT-dictation technology, any transcriptional errors that may result from this process are unintentional.  ?Patient: Thomas Cole ?Type: Established ?DOB: December 18, 1970 ?MRN: [id number] ?PCP: Derinda Late, MD  Service: Procedure ?DOS: 07/24/2021 ?Setting: Ambulatory ?Location: Ambulatory outpatient facility ?Delivery: Face-to-face Provider: Gillis Santa, MD ?Specialty: Interventional Pain Management ?Specialty designation: 09 ?Location: Outpatient facility ?Ref. Prov.: Derinda Late, MD   ? ?Primary Reason for Visit: Interventional Pain Management Treatment. ?CC: Back Pain (Lumbar bilateral.  Sometimes right side is the most intense ) and Neck Pain (Right ) ?  ?Procedure:          ? Type: Lumbar epidural steroid injection (LESI) (interlaminar) #1    ?Laterality: Midline   ?Level:  L3-4 Level.  ?Imaging: Fluoroscopic guidance ?Anesthesia: Local anesthesia (1-2% Lidocaine) ?Anxiolysis: Oral Valium 10 mg ?Sedation:  minimal . ?DOS: 07/24/2021  ?Performed by: Gillis Santa, MD ? ?Purpose: Diagnostic/Therapeutic ?Indications: Lumbar radicular pain of intraspinal etiology of more than 4 weeks that has failed to respond to conservative therapy and is severe enough to impact quality of life or function. ?1. Lumbar radiculopathy (L>R)   ?2. Chronic radicular lumbar pain   ?3. Neuroforaminal stenosis of lumbar spine (L3,4,5)   ?4. Chronic pain syndrome   ? ?NAS-11 Pain score:  ? Pre-procedure: 7 /10  ? Post-procedure: 5 /10  ? ?  ? ?Position / Prep / Materials:  ?Position: Prone w/ head of the table raised (slight reverse trendelenburg) to facilitate breathing.  ?Prep solution: DuraPrep (Iodine Povacrylex [0.7% available iodine] and Isopropyl Alcohol, 74% w/w) ?Prep Area:  Entire Posterior Lumbar Region from lower scapular tip down to mid buttocks area and from flank to flank. ?Materials:  ?Tray: Epidural tray ?Needle(s):  ?Type: Epidural needle          ?Gauge (G):  17 ?Length: Regular (3.5-in) ?Qty: 1 ? ?Pre-op H&P Assessment:  ?[name] is a 51 y.o. (year old), male patient, seen today for interventional treatment. He  has a past surgical history that includes Knee surgery; Posterior fusion cervical spine; Cervical disc surgery; Appendectomy (12); Anterior cervical corpectomy (N/A, 09/15/2012); Cholecystectomy (N/A, 03/29/2017); Colonoscopy with propofol (N/A, 10/23/2019); and Esophagogastroduodenoscopy (egd) with propofol (N/A, 10/23/2019). [name] has a current medication list which includes the following prescription(s): carbidopa-levodopa, carbidopa-levodopa, vitamin d3, clonazepam, duloxetine, glipizide, hydrochlorothiazide, [START ON 07/27/2021] hydrocodone bitartrate er, [START ON 08/26/2021] hydrocodone bitartrate er, [START ON 09/25/2021] hydrocodone bitartrate er, [START ON 07/25/2021] hydrocodone-acetaminophen, [START ON 08/24/2021] hydrocodone-acetaminophen, [START ON 09/23/2021] hydrocodone-acetaminophen, losartan, metformin, nortriptyline, omeprazole, phenytoin, pregabalin, propranolol, tizanidine, ubrelvy, vitamin b-12, atogepant, butalbital-acetaminophen-caffeine, diazepam, entacapone, furosemide, glipizide-metformin, lidocaine, and sucralfate. His primarily concern today is the Back Pain (Lumbar bilateral.  Sometimes right side is the most intense ) and Neck Pain (Right ) ? ?Initial Vital Signs:  ?Pulse/HCG Rate: 96ECG Heart Rate: 88 ?Temp: 97.9 ?F (36.6 ?C) ?Resp: 16 ?BP: 133/87 ?SpO2: 98 % ? ?BMI: Estimated body mass index is 40.06 kg/m? as calculated from the following: ?  Height as of this encounter: '6\' 2"'$  (1.88 m). ?  Weight as of this encounter: 312 lb (141.5 kg). ? ?Risk Assessment: ?Allergies: Reviewed. He is allergic to amitriptyline, bee venom, chlorhexidine,  carbamazepine, meloxicam, morphine and related, and sulfa antibiotics.  ?Allergy Precautions: None required ?Coagulopathies: Reviewed. None identified.  ?Blood-thinner therapy: None at this time ?Active Infection(s): Reviewed. None  identified. [name] is afebrile ? ?Site Confirmation: [name] was asked to confirm the procedure and laterality before marking the site ?Procedure checklist: Completed ?Consent: Before the procedure and under the influence of no sedative(s), amnesic(s), or anxiolytics, the patient was informed of the treatment options, risks and possible complications. To fulfill our ethical and legal obligations, as recommended by the American Medical Association's Code of Ethics, I have informed the patient of my clinical impression; the nature and purpose of the treatment or procedure; the risks, benefits, and possible complications of the intervention; the alternatives, including doing nothing; the risk(s) and benefit(s) of the alternative treatment(s) or procedure(s); and the risk(s) and benefit(s) of doing nothing. ?The patient was provided information about the general risks and possible complications associated with the procedure. These may include, but are not limited to: failure to achieve desired goals, infection, bleeding, organ or nerve damage, allergic reactions, paralysis, and death. ?In addition, the patient was informed of those risks and complications associated to Spine-related procedures, such as failure to decrease pain; infection (i.e.: Meningitis, epidural or intraspinal abscess); bleeding (i.e.: epidural hematoma, subarachnoid hemorrhage, or any other type of intraspinal or peri-dural bleeding); organ or nerve damage (i.e.: Any type of peripheral nerve, nerve root, or spinal cord injury) with subsequent damage to sensory, motor, and/or autonomic systems, resulting in permanent pain, numbness, and/or weakness of one or several areas of the body; allergic reactions; (i.e.:  anaphylactic reaction); and/or death. ?Furthermore, the patient was informed of those risks and complications associated with the medications. These include, but are not limited to: allergic reactions (i.e.: anaphylactic or anaphylactoid reaction(s)); adrenal axis suppression; blood sugar elevation that in diabetics may result in ketoacidosis or comma; water retention that in patients with history of congestive heart failure may result in shortness of breath, pulmonary edema, and decompensation with resultant heart failure; weight gain; swelling or edema; medication-induced neural toxicity; particulate matter embolism and blood vessel occlusion with resultant organ, and/or nervous system infarction; and/or aseptic necrosis of one or more joints. ?Finally, the patient was informed that Medicine is not an exact science; therefore, there is also the possibility of unforeseen or unpredictable risks and/or possible complications that may result in a catastrophic outcome. The patient indicated having understood very clearly. We have given the patient no guarantees and we have made no promises. Enough time was given to the patient to ask questions, all of which were answered to the patient's satisfaction. [name] has indicated that he wanted to continue with the procedure. ?Attestation: I, the ordering provider, attest that I have discussed with the patient the benefits, risks, side-effects, alternatives, likelihood of achieving goals, and potential problems during recovery for the procedure that I have provided informed consent. ?Date  Time: 07/24/2021 12:48 PM ? ?Pre-Procedure Preparation:  ?Monitoring: As per clinic protocol. Respiration, ETCO2, SpO2, BP, heart rate and rhythm monitor placed and checked for adequate function ?Safety Precautions: Patient was assessed for positional comfort and pressure points before starting the procedure. ?Time-out: I initiated and conducted the "Time-out" before starting the  procedure, as per protocol. The patient was asked to participate by confirming the accuracy of the "Time Out" information. Verification of the correct person, site, and procedure were performed and confirmed by m

## 2021-07-25 ENCOUNTER — Telehealth: Payer: Self-pay

## 2021-07-25 NOTE — Telephone Encounter (Signed)
Post procedure phone call.  Patient states he is doing good.  

## 2021-08-16 ENCOUNTER — Encounter: Payer: Self-pay | Admitting: Student in an Organized Health Care Education/Training Program

## 2021-08-17 ENCOUNTER — Encounter: Payer: Self-pay | Admitting: Student in an Organized Health Care Education/Training Program

## 2021-08-17 ENCOUNTER — Ambulatory Visit
Payer: Medicare Other | Attending: Student in an Organized Health Care Education/Training Program | Admitting: Student in an Organized Health Care Education/Training Program

## 2021-08-17 DIAGNOSIS — G894 Chronic pain syndrome: Secondary | ICD-10-CM | POA: Diagnosis not present

## 2021-08-17 DIAGNOSIS — M48061 Spinal stenosis, lumbar region without neurogenic claudication: Secondary | ICD-10-CM | POA: Diagnosis not present

## 2021-08-17 DIAGNOSIS — M5416 Radiculopathy, lumbar region: Secondary | ICD-10-CM

## 2021-08-17 DIAGNOSIS — G8929 Other chronic pain: Secondary | ICD-10-CM | POA: Diagnosis not present

## 2021-08-17 NOTE — Progress Notes (Signed)
Patient: Thomas Cole  Service Category: E/M  Provider: Gillis Santa, MD  DOB: 10-Sep-1970  DOS: 08/17/2021  Location: Office  MRN: [id number]  Setting: Ambulatory outpatient  Referring Provider: Derinda Late, MD  Type: Established Patient  Specialty: Interventional Pain Management  PCP: Derinda Late, MD  Location: Remote location  Delivery: TeleHealth     Virtual Encounter - Pain Management PROVIDER NOTE: Information contained herein reflects review and annotations entered in association with encounter. Interpretation of such information and data should be left to medically-trained personnel. Information provided to patient can be located elsewhere in the medical record under "Patient Instructions". Document created using STT-dictation technology, any transcriptional errors that may result from process are unintentional.    Contact & Pharmacy Preferred: [phone] Home: [phone] (home) Mobile: [phone] (mobile) E-mail: [email or web address]  [place] [place] - [address] [place] [address] Phone: [phone] Fax: [phone]   Pre-screening  Mr. Rosete offered "in-person" vs "virtual" encounter. He indicated preferring virtual for this encounter.   Reason COVID-19*  Social distancing based on CDC and AMA recommendations.   I contacted Thomas Cole on 08/17/2021 via telephone.      I clearly identified myself as Gillis Santa, MD. I verified that I was speaking with the correct person using two identifiers (Name: KUNAAL WALKINS, and date of birth: 1970-07-20).  Consent I sought verbal advanced consent from Thomas Cole for virtual visit interactions. I informed Mr. Mroczkowski of possible security and privacy concerns, risks, and limitations associated with providing "not-in-person" medical evaluation and management services. I also informed Mr. Stutsman of the availability of "in-person" appointments. Finally, I informed him  that there would be a charge for the virtual visit and that he could be  personally, fully or partially, financially responsible for it. Mr. Jaime expressed understanding and agreed to proceed.   Historic Elements   Mr. DORELL GATLIN is a 51 y.o. year old, male patient evaluated today after our last contact on 07/24/2021. Mr. Kazmierski  has a past medical history of Anxiety, Asthma, Back problem, Benign essential hypertension (11/30/2016), Cervical spondylosis without myelopathy (3/61/2244), Complication of anesthesia, Depression, Diabetes mellitus without complication ([place]) (97/5300), GERD (gastroesophageal reflux disease), Hypertension, Labile hypertension (03/19/2013), Major depressive disorder, recurrent episode, severe ([place]) (06/23/2015), Migraine headache, Migraines (11/11/2013), S/P appendectomy (04/09/2011), Seizure ([place]) (11/30/2016), Seizures ([place]), Shortness of breath, and SOB (shortness of breath) (08/06/2014). He also  has a past surgical history that includes Knee surgery; Posterior fusion cervical spine; Cervical disc surgery; Appendectomy (12); Anterior cervical corpectomy (N/A, 09/15/2012); Cholecystectomy (N/A, 03/29/2017); Colonoscopy with propofol (N/A, 10/23/2019); and Esophagogastroduodenoscopy (egd) with propofol (N/A, 10/23/2019). Mr. Madariaga has a current medication list which includes the following prescription(s): atogepant, vitamin d3, clonazepam, duloxetine, furosemide, glipizide-metformin, hydrochlorothiazide, hydrocodone bitartrate er, [START ON 08/26/2021] hydrocodone bitartrate er, [START ON 09/25/2021] hydrocodone bitartrate er, hydrocodone-acetaminophen, [START ON 08/24/2021] hydrocodone-acetaminophen, [START ON 09/23/2021] hydrocodone-acetaminophen, losartan, metformin, metoprolol succinate, nortriptyline, omeprazole, phenytoin, pregabalin, propranolol, tizanidine, vitamin b-12, butalbital-acetaminophen-caffeine, carbidopa-levodopa, carbidopa-levodopa, diazepam, entacapone, glipizide, lidocaine,  sucralfate, and ubrelvy. He  reports that he has never smoked. He has never used smokeless tobacco. He reports that he does not drink alcohol and does not use drugs. Mr. Chovanec is allergic to amitriptyline, bee venom, chlorhexidine, carbamazepine, meloxicam, morphine and related, and sulfa antibiotics.   HPI  Today, he is being contacted for a post-procedure assessment.   Post-procedure evaluation   Type: Lumbar epidural steroid injection (LESI) (interlaminar) #1    Laterality: Midline  Level:  L3-4 Level.  Imaging: Fluoroscopic guidance Anesthesia: Local anesthesia (1-2% Lidocaine) Anxiolysis: Oral Valium 10 mg Sedation:  minimal . DOS: 07/24/2021  Performed by: Gillis Santa, MD  Purpose: Diagnostic/Therapeutic Indications: Lumbar radicular pain of intraspinal etiology of more than 4 weeks that has failed to respond to conservative therapy and is severe enough to impact quality of life or function. 1. Lumbar radiculopathy (L>R)   2. Chronic radicular lumbar pain   3. Neuroforaminal stenosis of lumbar spine (L3,4,5)   4. Chronic pain syndrome    NAS-11 Pain score:   Pre-procedure: 7 /10   Post-procedure: 5 /10      Effectiveness:  Initial hour after procedure: 0 %  Subsequent 4-6 hours post-procedure: 30 %  Analgesia past initial 6 hours: 50 %  Ongoing improvement:  Analgesic:  50% however over the last 2-3 days his pain has been increasing. Function: Somewhat improved however given increased pain over last 2-3 days finding it more difficult to do ADLs ROM: Back to baseline   Pharmacotherapy Assessment   Opioid Analgesic: Hydrocodone extended release 10 mg twice daily, hydrocodone immediate release 10 mg 3 times daily as needed for breakthrough pain    Monitoring: [place] PMP: PDMP reviewed during this encounter.       Pharmacotherapy: No side-effects or adverse reactions reported. Compliance: No problems identified. Effectiveness: Clinically acceptable. Plan: Refer to  "POC". UDS:  Summary  Date Value Ref Range Status  10/26/2020 Note  Final    Comment:    ==================================================================== [employer] [facility] (MW) ==================================================================== Test                             Result       Flag       Units  Drug Present and Declared for Prescription Verification   Hydrocodone                    349          EXPECTED   ng/mg creat   Hydromorphone                  95           EXPECTED   ng/mg creat   Dihydrocodeine                 108          EXPECTED   ng/mg creat   Norhydrocodone                 1578         EXPECTED   ng/mg creat    Sources of hydrocodone include scheduled prescription medications.    Hydromorphone, dihydrocodeine and norhydrocodone are expected    metabolites of hydrocodone. Hydromorphone and dihydrocodeine are    also available as scheduled prescription medications.  Drug Absent but Declared for Prescription Verification   Clonazepam                     Not Detected UNEXPECTED ng/mg creat   Butalbital                     Not Detected UNEXPECTED ==================================================================== Test                      Result    Flag   Units      Ref Range   Creatinine  65               mg/dL      >=20 ==================================================================== Declared Medications:  The flagging and interpretation on this report are based on the  following declared medications.  Unexpected results may arise from  inaccuracies in the declared medications.   **Note: The testing scope of this panel includes these medications:   Butalbital (Fioricet)  Clonazepam (Klonopin)  Hydrocodone  Hydrocodone (Norco)   **Note: The testing scope of this panel does not include the  following reported medications:   Acetaminophen (Fioricet)  Acetaminophen (Norco)  Caffeine (Fioricet)  Carbidopa (Sinemet)   Cyanocobalamin  Duloxetine (Cymbalta)  Entacapone  Glipizide  Hydrochlorothiazide (Hydrodiuril)  Levodopa (Sinemet)  Losartan (Cozaar)  Metformin (Glucophage)  Nortriptyline (Pamelor)  Omeprazole (Prilosec)  Phenytoin (Dilantin)  Pregabalin (Lyrica)  Propranolol (Inderal)  Sucralfate (Carafate)  Tizanidine (Zanaflex)  Vitamin D3 ==================================================================== For clinical consultation, please call [phone]. ====================================================================      Laboratory Chemistry Profile   Renal Lab Results  Component Value Date   BUN 13 04/21/2017   CREATININE 0.93 04/21/2017   LABCREA 79.3 03/05/2014   GFR 82.19 08/05/2014   GFRAA >60 04/21/2017   GFRNONAA >60 04/21/2017    Hepatic Lab Results  Component Value Date   AST 30 04/21/2017   ALT 23 04/21/2017   ALBUMIN 4.2 04/21/2017   ALKPHOS 86 04/21/2017   LIPASE 51 04/21/2017    Electrolytes Lab Results  Component Value Date   NA 138 04/21/2017   K 3.7 04/21/2017   CL 101 04/21/2017   CALCIUM 9.4 04/21/2017   MG 1.9 04/26/2020    Bone Lab Results  Component Value Date   VD25OH 31.95 03/05/2016   25OHVITD1 35 04/26/2020   25OHVITD2 <1.0 04/26/2020   25OHVITD3 35 04/26/2020    Inflammation (CRP: Acute Phase) (ESR: Chronic Phase) Lab Results  Component Value Date   CRP 10 04/26/2020   ESRSEDRATE 22 04/26/2020         Note: Above Lab results reviewed.   Assessment  The primary encounter diagnosis was Lumbar radiculopathy (L>R). Diagnoses of Chronic radicular lumbar pain, Neuroforaminal stenosis of lumbar spine (L3,4,5), and Chronic pain syndrome were also pertinent to this visit.  Plan of Care  Given positive response to initial lumbar epidural steroid injection with return of pain over the last 2 to 3 days and greater difficulty with ADLs, recommend repeating epidural steroid injection.  We will try and increase epidural volume to  see if that impacts duration of pain relief.  Risk and benefits reviewed and patient would like to proceed.  We will plan on doing this with p.o. Valium.    Orders:  Orders Placed This Encounter  Procedures   Lumbar Epidural Injection    Standing Status:   Future    Standing Expiration Date:   09/17/2021    Scheduling Instructions:     Procedure: Interlaminar Lumbar Epidural Steroid injection (LESI)            Laterality: Midline L3-4 ESI     Sedation: PO Valium     Timeframe: ASAA    Order Specific Question:   Where will this procedure be performed?    Answer:   [facility]   Follow-up plan:   Return in about 6 days (around 08/23/2021) for L3/4 ESI #2, in clinic (PO Valium).     Diagnostic cervical facet medial branch nerve blocks C3-C7 b/l 05/06/19- did not help Diagnostic thoracic facet medial branch nerve  blocks T1-T2, consider bilateral occipital nerve block  Cervical/trapezius trigger point injections SPG block                  Recent Visits Date Type Provider Dept  07/24/21 Procedure visit Gillis Santa, MD [facility]  07/04/21 Office Visit Gillis Santa, MD [facility]  Showing recent visits within past 90 days and meeting all other requirements Today's Visits Date Type Provider Dept  08/17/21 Office Visit Gillis Santa, MD [facility]  Showing today's visits and meeting all other requirements Future Appointments Date Type Provider Dept  10/17/21 Appointment Gillis Santa, MD [facility]  Showing future appointments within next 90 days and meeting all other requirements  I discussed the assessment and treatment plan with the patient. The patient was provided an opportunity to ask questions and all were answered. The patient agreed with the plan and demonstrated an understanding of the instructions.  Patient advised to call back or seek an in-person evaluation if the symptoms or condition worsens.  Duration of  encounter: 40mnutes.  Note by: BGillis Santa MD Date: 08/17/2021; Time: 1:28 PM

## 2021-08-21 ENCOUNTER — Telehealth: Payer: Medicare Other | Admitting: Student in an Organized Health Care Education/Training Program

## 2021-08-24 ENCOUNTER — Telehealth: Payer: Self-pay | Admitting: *Deleted

## 2021-08-24 NOTE — Telephone Encounter (Signed)
Caryl Pina from [place] called and d/t holiday they need to send patient's pill pack out 1 day early and she wanted an ok.  Okay given to do this.

## 2021-08-30 ENCOUNTER — Ambulatory Visit: Payer: Medicare Other | Admitting: Student in an Organized Health Care Education/Training Program

## 2021-09-04 ENCOUNTER — Ambulatory Visit
Admission: RE | Admit: 2021-09-04 | Discharge: 2021-09-04 | Disposition: A | Discharge status: Home / Self Care | Payer: Medicare Other | Source: Ambulatory Visit | Attending: Student in an Organized Health Care Education/Training Program | Admitting: Student in an Organized Health Care Education/Training Program

## 2021-09-04 ENCOUNTER — Ambulatory Visit
Payer: Medicare Other | Attending: Student in an Organized Health Care Education/Training Program | Admitting: Student in an Organized Health Care Education/Training Program

## 2021-09-04 ENCOUNTER — Encounter: Payer: Self-pay | Admitting: Student in an Organized Health Care Education/Training Program

## 2021-09-04 VITALS — BP 112/68 | HR 103 | Temp 97.3°F | Resp 18 | Ht 74.0 in | Wt 312.0 lb

## 2021-09-04 DIAGNOSIS — G894 Chronic pain syndrome: Secondary | ICD-10-CM | POA: Diagnosis present

## 2021-09-04 DIAGNOSIS — M48061 Spinal stenosis, lumbar region without neurogenic claudication: Secondary | ICD-10-CM | POA: Insufficient documentation

## 2021-09-04 DIAGNOSIS — M5416 Radiculopathy, lumbar region: Secondary | ICD-10-CM | POA: Diagnosis not present

## 2021-09-04 DIAGNOSIS — G8929 Other chronic pain: Secondary | ICD-10-CM | POA: Diagnosis present

## 2021-09-04 MED ORDER — SODIUM CHLORIDE 0.9% FLUSH
2.0000 mL | Freq: Once | INTRAVENOUS | Status: AC
  Administered 2021-09-04: 2 mL

## 2021-09-04 MED ORDER — DEXAMETHASONE SODIUM PHOSPHATE 10 MG/ML IJ SOLN
10.0000 mg | Freq: Once | INTRAMUSCULAR | Status: AC
  Administered 2021-09-04: 10 mg

## 2021-09-04 MED ORDER — DIAZEPAM 5 MG PO TABS
10.0000 mg | ORAL_TABLET | ORAL | Status: AC
  Administered 2021-09-04: 10 mg via ORAL

## 2021-09-04 MED ORDER — DIAZEPAM 5 MG PO TABS
ORAL_TABLET | ORAL | Status: AC
  Filled 2021-09-04: qty 2

## 2021-09-04 MED ORDER — ROPIVACAINE HCL 2 MG/ML IJ SOLN
2.0000 mL | Freq: Once | INTRAMUSCULAR | Status: AC
  Administered 2021-09-04: 2 mL via EPIDURAL

## 2021-09-04 MED ORDER — LIDOCAINE HCL 2 % IJ SOLN
20.0000 mL | Freq: Once | INTRAMUSCULAR | Status: AC
  Administered 2021-09-04: 100 mg

## 2021-09-04 MED ORDER — IOHEXOL 180 MG/ML  SOLN
10.0000 mL | Freq: Once | INTRAMUSCULAR | Status: AC
  Administered 2021-09-04: 5 mL via EPIDURAL

## 2021-09-04 NOTE — Progress Notes (Signed)
[facility];PROVIDER NOTE: Interpretation of information contained herein should be left to medically-trained personnel. Specific patient instructions are provided elsewhere under "Patient Instructions" section of medical record. This document was created in part using STT-dictation technology, any transcriptional errors that may result from this process are unintentional.  Patient: Thomas Cole DOB: [birth year] MRN: [id number] PCP: Derinda Late, MD  Service: Procedure DOS: 09/04/2021 Setting: Ambulatory Location: Ambulatory outpatient facility Delivery: Face-to-face Provider: Gillis Santa, MD Specialty: Interventional Pain Management Specialty designation: 09 Location: Outpatient facility Ref. Prov.: Derinda Late, MD    Primary Reason for Visit: Interventional Pain Management Treatment. CC: Back Pain (low) and Neck Pain   Procedure:           Type: Lumbar epidural steroid injection (LESI) (interlaminar) #2    Laterality: Left   Level:  L3-4 Level.  Imaging: Fluoroscopic guidance Anesthesia: Local anesthesia (1-2% Lidocaine) Anxiolysis: Oral Valium 10 mg Sedation:  minimal . DOS: 09/04/2021  Performed by: Gillis Santa, MD  Purpose: Diagnostic/Therapeutic Indications: Lumbar radicular pain of intraspinal etiology of more than 4 weeks that has failed to respond to conservative therapy and is severe enough to impact quality of life or function. 1. Lumbar radiculopathy (L>R)   2. Chronic radicular lumbar pain   3. Neuroforaminal stenosis of lumbar spine (L3,4,5)   4. Chronic pain syndrome     NAS-11 Pain score:   Pre-procedure: 8 /10   Post-procedure: 8 /10      Position / Prep / Materials:  Position: Prone w/ head of the table raised (slight reverse trendelenburg) to facilitate breathing.  Prep solution: DuraPrep (Iodine Povacrylex [0.7% available iodine] and Isopropyl Alcohol, 74% w/w) Prep Area: Entire Posterior Lumbar Region from lower scapular tip down to  mid buttocks area and from flank to flank. Materials:  Tray: Epidural tray Needle(s):  Type: Epidural needle          Gauge (G):  17 Length: Regular (3.5-in) Qty: 1  Pre-op H&P Assessment:  Thomas Cole is a 51 y.o. (year old), male patient, seen today for interventional treatment. He  has a past surgical history that includes Knee surgery; Posterior fusion cervical spine; Cervical disc surgery; Appendectomy (12); Anterior cervical corpectomy (N/A, 09/15/2012); Cholecystectomy (N/A, 03/29/2017); Colonoscopy with propofol (N/A, 10/23/2019); and Esophagogastroduodenoscopy (egd) with propofol (N/A, 10/23/2019). Thomas Cole has a current medication list which includes the following prescription(s): atogepant, carbidopa-levodopa, carbidopa-levodopa, vitamin d3, clonazepam, entacapone, furosemide, glipizide, glipizide-metformin, hydrochlorothiazide, hydrocodone bitartrate er, hydrocodone-acetaminophen, [START ON 09/23/2021] hydrocodone-acetaminophen, losartan, metformin, metoprolol succinate, nortriptyline, omeprazole, phenytoin, pregabalin, propranolol, tizanidine, vitamin b-12, butalbital-acetaminophen-caffeine, diazepam, duloxetine, [START ON 09/25/2021] hydrocodone bitartrate er, hydrocodone-acetaminophen, lidocaine, sucralfate, and ubrelvy. His primarily concern today is the Back Pain (low) and Neck Pain  Initial Vital Signs:  Pulse/HCG Rate: (!) 103ECG Heart Rate: 89 Temp: (!) 97.3 F (36.3 C) Resp: 18 BP: 118/77 SpO2: 98 %  BMI: Estimated body mass index is 40.06 kg/m as calculated from the following:   Height as of this encounter: '6\' 2"'$  (1.88 m).   Weight as of this encounter: 312 lb (141.5 kg).  Risk Assessment: Allergies: Reviewed. He is allergic to amitriptyline, bee venom, chlorhexidine, carbamazepine, meloxicam, morphine and related, and sulfa antibiotics.  Allergy Precautions: None required Coagulopathies: Reviewed. None identified.  Blood-thinner therapy: None at this time Active  Infection(s): Reviewed. None identified. Thomas Cole is afebrile  Site Confirmation: Thomas Cole was asked to confirm the procedure and laterality before marking the site Procedure checklist: Completed Consent: Before the procedure and under the influence of  no sedative(s), amnesic(s), or anxiolytics, the patient was informed of the treatment options, risks and possible complications. To fulfill our ethical and legal obligations, as recommended by the American Medical Association's Code of Ethics, I have informed the patient of my clinical impression; the nature and purpose of the treatment or procedure; the risks, benefits, and possible complications of the intervention; the alternatives, including doing nothing; the risk(s) and benefit(s) of the alternative treatment(s) or procedure(s); and the risk(s) and benefit(s) of doing nothing. The patient was provided information about the general risks and possible complications associated with the procedure. These may include, but are not limited to: failure to achieve desired goals, infection, bleeding, organ or nerve damage, allergic reactions, paralysis, and death. In addition, the patient was informed of those risks and complications associated to Spine-related procedures, such as failure to decrease pain; infection (i.e.: Meningitis, epidural or intraspinal abscess); bleeding (i.e.: epidural hematoma, subarachnoid hemorrhage, or any other type of intraspinal or peri-dural bleeding); organ or nerve damage (i.e.: Any type of peripheral nerve, nerve root, or spinal cord injury) with subsequent damage to sensory, motor, and/or autonomic systems, resulting in permanent pain, numbness, and/or weakness of one or several areas of the body; allergic reactions; (i.e.: anaphylactic reaction); and/or death. Furthermore, the patient was informed of those risks and complications associated with the medications. These include, but are not limited to: allergic reactions (i.e.:  anaphylactic or anaphylactoid reaction(s)); adrenal axis suppression; blood sugar elevation that in diabetics may result in ketoacidosis or comma; water retention that in patients with history of congestive heart failure may result in shortness of breath, pulmonary edema, and decompensation with resultant heart failure; weight gain; swelling or edema; medication-induced neural toxicity; particulate matter embolism and blood vessel occlusion with resultant organ, and/or nervous system infarction; and/or aseptic necrosis of one or more joints. Finally, the patient was informed that Medicine is not an exact science; therefore, there is also the possibility of unforeseen or unpredictable risks and/or possible complications that may result in a catastrophic outcome. The patient indicated having understood very clearly. We have given the patient no guarantees and we have made no promises. Enough time was given to the patient to ask questions, all of which were answered to the patient's satisfaction. Thomas Cole has indicated that he wanted to continue with the procedure. Attestation: I, the ordering provider, attest that I have discussed with the patient the benefits, risks, side-effects, alternatives, likelihood of achieving goals, and potential problems during recovery for the procedure that I have provided informed consent. Date  Time: 09/04/2021 10:31 AM  Pre-Procedure Preparation:  Monitoring: As per clinic protocol. Respiration, ETCO2, SpO2, BP, heart rate and rhythm monitor placed and checked for adequate function Safety Precautions: Patient was assessed for positional comfort and pressure points before starting the procedure. Time-out: I initiated and conducted the "Time-out" before starting the procedure, as per protocol. The patient was asked to participate by confirming the accuracy of the "Time Out" information. Verification of the correct person, site, and procedure were performed and confirmed by me,  the nursing staff, and the patient. "Time-out" conducted as per Joint Commission's Universal Protocol (UP.01.01.01). Time: 1106  Description/Narrative of Procedure:          Target: Epidural space via interlaminar opening, initially targeting the lower laminar border of the superior vertebral body. Region: Lumbar Approach: Percutaneous paravertebral  Rationale (medical necessity): procedure needed and proper for the diagnosis and/or treatment of the patient's medical symptoms and needs. Procedural Technique Safety Precautions: Aspiration looking  for blood return was conducted prior to all injections. At no point did we inject any substances, as a needle was being advanced. No attempts were made at seeking any paresthesias. Safe injection practices and needle disposal techniques used. Medications properly checked for expiration dates. SDV (single dose vial) medications used. Description of the Procedure: Protocol guidelines were followed. The procedure needle was introduced through the skin, ipsilateral to the reported pain, and advanced to the target area. Bone was contacted and the needle walked caudad, until the lamina was cleared. The epidural space was identified using "loss-of-resistance technique" with 2-3 ml of PF-NaCl (0.9% NSS), in a 5cc LOR glass syringe.  Vitals:   09/04/21 1034 09/04/21 1101 09/04/21 1106 09/04/21 1111  BP: 118/77 119/64 121/68 112/68  Pulse: (!) 103     Resp: '18 16 16 18  '$ Temp: (!) 97.3 F (36.3 C)     SpO2: 98% 96% 97% 96%  Weight: (!) 312 lb (141.5 kg)     Height: '6\' 2"'$  (1.88 m)         6.5 cc solution made of 3.5cc of preservative-free saline, 2 cc of 0.2% ropivacaine, 1 cc of Decadron 10 mg/cc.   Start Time: 1106 hrs. End Time: 1112 hrs.  Imaging Guidance (Spinal):          Type of Imaging Technique: Fluoroscopy Guidance (Spinal) Indication(s): Assistance in needle guidance and placement for procedures requiring needle placement in or near specific  anatomical locations not easily accessible without such assistance. Exposure Time: Please see nurses notes. Contrast: Before injecting any contrast, we confirmed that the patient did not have an allergy to iodine, shellfish, or radiological contrast. Once satisfactory needle placement was completed at the desired level, radiological contrast was injected. Contrast injected under live fluoroscopy. No contrast complications. See chart for type and volume of contrast used. Fluoroscopic Guidance: I was personally present during the use of fluoroscopy. "Tunnel Vision Technique" used to obtain the best possible view of the target area. Parallax error corrected before commencing the procedure. "Direction-depth-direction" technique used to introduce the needle under continuous pulsed fluoroscopy. Once target was reached, antero-posterior, oblique, and lateral fluoroscopic projection used confirm needle placement in all planes. Images permanently stored in EMR. Interpretation: I personally interpreted the imaging intraoperatively. Adequate needle placement confirmed in multiple planes. Appropriate spread of contrast into desired area was observed. No evidence of afferent or efferent intravascular uptake. No intrathecal or subarachnoid spread observed. Permanent images saved into the patient's record.   Post-operative Assessment:  Post-procedure Vital Signs:  Pulse/HCG Rate: (!) 10396 Temp: (!) 97.3 F (36.3 C) Resp: 18 BP: 112/68 SpO2: 96 %  EBL: None  Complications: No immediate post-treatment complications observed by team, or reported by patient.  Note: The patient tolerated the entire procedure well. A repeat set of vitals were taken after the procedure and the patient was kept under observation following institutional policy, for this type of procedure. Post-procedural neurological assessment was performed, showing return to baseline, prior to discharge. The patient was provided with post-procedure  discharge instructions, including a section on how to identify potential problems. Should any problems arise concerning this procedure, the patient was given instructions to immediately contact us, at any time, without hesitation. In any case, we plan to contact the patient by telephone for a follow-up status report regarding this interventional procedure.  Comments:  No additional relevant information.  Plan of Care  Orders:  Orders Placed This Encounter  Procedures   DG PAIN CLINIC C-ARM 1-60 MIN  NO REPORT    Intraoperative interpretation by procedural physician at [facility].    Standing Status:   Standing    Number of Occurrences:   1    Order Specific Question:   Reason for exam:    Answer:   Assistance in needle guidance and placement for procedures requiring needle placement in or near specific anatomical locations not easily accessible without such assistance.   Chronic Opioid Analgesic:  Hydrocodone extended release 10 mg twice daily, hydrocodone immediate release 10 mg 3 times daily as needed for breakthrough pain    Medications ordered for procedure: Meds ordered this encounter  Medications   iohexol (OMNIPAQUE) 180 MG/ML injection 10 mL    Must be Myelogram-compatible. If not available, you may substitute with a water-soluble, non-ionic, hypoallergenic, myelogram-compatible radiological contrast medium.   lidocaine (XYLOCAINE) 2 % (with pres) injection 400 mg   ropivacaine (PF) 2 mg/mL (0.2%) (NAROPIN) injection 2 mL   sodium chloride flush (NS) 0.9 % injection 2 mL   dexamethasone (DECADRON) injection 10 mg   diazepam (VALIUM) tablet 10 mg    Make sure Flumazenil is available in the pyxis when using this medication. If oversedation occurs, administer 0.2 mg IV over 15 sec. If after 45 sec no response, administer 0.2 mg again over 1 min; may repeat at 1 min intervals; not to exceed 4 doses (1 mg)   Medications administered: We administered iohexol, lidocaine,  ropivacaine (PF) 2 mg/mL (0.2%), sodium chloride flush, dexamethasone, and diazepam.  See the medical record for exact dosing, route, and time of administration.  Follow-up plan:   Return for Keep sch. appt.      Recent Visits Date Type Provider Dept  08/17/21 Office Visit Gillis Santa, MD [facility]  07/24/21 Procedure visit Gillis Santa, MD [facility]  07/04/21 Office Visit Gillis Santa, MD [facility]  Showing recent visits within past 90 days and meeting all other requirements Today's Visits Date Type Provider Dept  09/04/21 Procedure visit Gillis Santa, MD [facility]  Showing today's visits and meeting all other requirements Future Appointments Date Type Provider Dept  10/17/21 Appointment Gillis Santa, MD [facility]  Showing future appointments within next 90 days and meeting all other requirements  Disposition: Discharge home  Discharge (Date  Time): 09/04/2021; 1125 hrs.   Primary Care Physician: Derinda Late, MD Location: [facility] Note by: Gillis Santa, MD Date: 09/04/2021; Time: 11:20 AM  Disclaimer:  Medicine is not an exact science. The only guarantee in medicine is that nothing is guaranteed. It is important to note that the decision to proceed with this intervention was based on the information collected from the patient. The Data and conclusions were drawn from the patient's questionnaire, the interview, and the physical examination. Because the information was provided in large part by the patient, it cannot be guaranteed that it has not been purposely or unconsciously manipulated. Every effort has been made to obtain as much relevant data as possible for this evaluation. It is important to note that the conclusions that lead to this procedure are derived in large part from the available data. Always take into account that the treatment will also be dependent on availability of  resources and existing treatment guidelines, considered by other Pain Management Practitioners as being common knowledge and practice, at the time of the intervention. For Medico-Legal purposes, it is also important to point out that variation in procedural techniques and pharmacological choices are  the acceptable norm. The indications, contraindications, technique, and results of the above procedure should only be interpreted and judged by a Board-Certified Interventional Pain Specialist with extensive familiarity and expertise in the same exact procedure and technique.

## 2021-09-04 NOTE — Patient Instructions (Signed)
Pain Management Discharge Instructions  General Discharge Instructions :  If you need to reach your doctor call: Monday-Friday 8:00 am - 4:00 pm at [phone] or toll free [phone].  After clinic hours [phone] to have operator reach doctor.  Bring all of your medication bottles to all your appointments in the pain clinic.  To cancel or reschedule your appointment with Pain Management please remember to call 24 hours in advance to avoid a fee.  Refer to the educational materials which you have been given on: General Risks, I had my Procedure. Discharge Instructions, Post Sedation.  Post Procedure Instructions:  The drugs you were given will stay in your system until tomorrow, so for the next 24 hours you should not drive, make any legal decisions or drink any alcoholic beverages.  You may eat anything you prefer, but it is better to start with liquids then soups and crackers, and gradually work up to solid foods.  Please notify your doctor immediately if you have any unusual bleeding, trouble breathing or pain that is not related to your normal pain.  Depending on the type of procedure that was done, some parts of your body may feel week and/or numb.  This usually clears up by tonight or the next day.  Walk with the use of an assistive device or accompanied by an adult for the 24 hours.  You may use ice on the affected area for the first 24 hours.  Put ice in a Ziploc bag and cover with a towel and place against area 15 minutes on 15 minutes off.  You may switch to heat after 24 hours.Epidural Steroid Injection Patient Information  Description: The epidural space surrounds the nerves as they exit the spinal cord.  In some patients, the nerves can be compressed and inflamed by a bulging disc or a tight spinal canal (spinal stenosis).  By injecting steroids into the epidural space, we can bring irritated nerves into direct contact with a potentially helpful medication.  These  steroids act directly on the irritated nerves and can reduce swelling and inflammation which often leads to decreased pain.  Epidural steroids may be injected anywhere along the spine and from the neck to the low back depending upon the location of your pain.   After numbing the skin with local anesthetic (like Novocaine), a small needle is passed into the epidural space slowly.  You may experience a sensation of pressure while this is being done.  The entire block usually last less than 10 minutes.  Conditions which may be treated by epidural steroids:  Low back and leg pain Neck and arm pain Spinal stenosis Post-laminectomy syndrome Herpes zoster (shingles) pain Pain from compression fractures  Preparation for the injection:  Do not eat any solid food or dairy products within 8 hours of your appointment.  You may drink clear liquids up to 3 hours before appointment.  Clear liquids include water, black coffee, juice or soda.  No milk or cream please. You may take your regular medication, including pain medications, with a sip of water before your appointment  Diabetics should hold regular insulin (if taken separately) and take 1/2 normal NPH dos the morning of the procedure.  Carry some sugar containing items with you to your appointment. A driver must accompany you and be prepared to drive you home after your procedure.  Bring all your current medications with your. An IV may be inserted and sedation may be given at the discretion of the physician.     A blood pressure cuff, EKG and other monitors will often be applied during the procedure.  Some patients may need to have extra oxygen administered for a short period. You will be asked to provide medical information, including your allergies, prior to the procedure.  We must know immediately if you are taking blood thinners (like Coumadin/Warfarin)  Or if you are allergic to IV iodine contrast (dye). We must know if you could possible be  pregnant.  Possible side-effects: Bleeding from needle site Infection (rare, may require surgery) Nerve injury (rare) Numbness & tingling (temporary) Difficulty urinating (rare, temporary) Spinal headache ( a headache worse with upright posture) Light -headedness (temporary) Pain at injection site (several days) Decreased blood pressure (temporary) Weakness in arm/leg (temporary) Pressure sensation in back/neck (temporary)  Call if you experience: Fever/chills associated with headache or increased back/neck pain. Headache worsened by an upright position. New onset weakness or numbness of an extremity below the injection site Hives or difficulty breathing (go to the emergency room) Inflammation or drainage at the infection site Severe back/neck pain Any new symptoms which are concerning to you  Please note:  Although the local anesthetic injected can often make your back or neck feel good for several hours after the injection, the pain will likely return.  It takes 3-7 days for steroids to work in the epidural space.  You may not notice any pain relief for at least that one week.  If effective, we will often do a series of three injections spaced 3-6 weeks apart to maximally decrease your pain.  After the initial series, we generally will wait several months before considering a repeat injection of the same type.  If you have any questions, please call [phone] [facility] 

## 2021-09-04 NOTE — Progress Notes (Signed)
Safety precautions to be maintained throughout the outpatient stay will include: orient to surroundings, keep bed in low position, maintain call bell within reach at all times, provide assistance with transfer out of bed and ambulation.  

## 2021-09-05 ENCOUNTER — Telehealth: Payer: Self-pay | Admitting: *Deleted

## 2021-09-05 NOTE — Telephone Encounter (Signed)
Attempted to call for post procedure follow-up. Message left. 

## 2021-10-17 ENCOUNTER — Ambulatory Visit
Payer: Medicare Other | Attending: Student in an Organized Health Care Education/Training Program | Admitting: Student in an Organized Health Care Education/Training Program

## 2021-10-17 ENCOUNTER — Encounter: Payer: Self-pay | Admitting: Student in an Organized Health Care Education/Training Program

## 2021-10-17 VITALS — BP 125/79 | HR 90 | Temp 97.1°F | Resp 16 | Ht 74.0 in | Wt 312.0 lb

## 2021-10-17 DIAGNOSIS — M48061 Spinal stenosis, lumbar region without neurogenic claudication: Secondary | ICD-10-CM | POA: Diagnosis present

## 2021-10-17 DIAGNOSIS — G959 Disease of spinal cord, unspecified: Secondary | ICD-10-CM | POA: Diagnosis present

## 2021-10-17 DIAGNOSIS — M48062 Spinal stenosis, lumbar region with neurogenic claudication: Secondary | ICD-10-CM | POA: Diagnosis present

## 2021-10-17 DIAGNOSIS — M5412 Radiculopathy, cervical region: Secondary | ICD-10-CM | POA: Diagnosis present

## 2021-10-17 DIAGNOSIS — G894 Chronic pain syndrome: Secondary | ICD-10-CM | POA: Diagnosis present

## 2021-10-17 DIAGNOSIS — G43009 Migraine without aura, not intractable, without status migrainosus: Secondary | ICD-10-CM

## 2021-10-17 DIAGNOSIS — G8929 Other chronic pain: Secondary | ICD-10-CM | POA: Diagnosis present

## 2021-10-17 DIAGNOSIS — M5416 Radiculopathy, lumbar region: Secondary | ICD-10-CM | POA: Diagnosis present

## 2021-10-17 DIAGNOSIS — M5414 Radiculopathy, thoracic region: Secondary | ICD-10-CM | POA: Diagnosis present

## 2021-10-17 MED ORDER — PREGABALIN 100 MG PO CAPS: 100.0000 mg | ORAL_CAPSULE | Freq: Three times a day (TID) | ORAL | 5 refills | Status: DC

## 2021-10-17 MED ORDER — UBRELVY 100 MG PO TABS: ORAL_TABLET | ORAL | 2 refills | Status: DC

## 2021-10-17 MED ORDER — HYDROCODONE-ACETAMINOPHEN 10-325 MG PO TABS: 1.0000 | ORAL_TABLET | Freq: Three times a day (TID) | ORAL | 0 refills | Status: DC | PRN

## 2021-10-17 MED ORDER — HYDROCODONE BITARTRATE ER 10 MG PO CP12: 10.0000 mg | ORAL_CAPSULE | Freq: Two times a day (BID) | ORAL | 0 refills | Status: DC

## 2021-10-17 NOTE — Patient Instructions (Signed)
Epidural Steroid Injection Patient Information  Description: The epidural space surrounds the nerves as they exit the spinal cord.  In some patients, the nerves can be compressed and inflamed by a bulging disc or a tight spinal canal (spinal stenosis).  By injecting steroids into the epidural space, we can bring irritated nerves into direct contact with a potentially helpful medication.  These steroids act directly on the irritated nerves and can reduce swelling and inflammation which often leads to decreased pain.  Epidural steroids may be injected anywhere along the spine and from the neck to the low back depending upon the location of your pain.   After numbing the skin with local anesthetic (like Novocaine), a small needle is passed into the epidural space slowly.  You may experience a sensation of pressure while this is being done.  The entire block usually last less than 10 minutes.  Conditions which may be treated by epidural steroids:  Low back and leg pain Neck and arm pain Spinal stenosis Post-laminectomy syndrome Herpes zoster (shingles) pain Pain from compression fractures  Preparation for the injection:  Do not eat any solid food or dairy products within 8 hours of your appointment.  You may drink clear liquids up to 3 hours before appointment.  Clear liquids include water, black coffee, juice or soda.  No milk or cream please. You may take your regular medication, including pain medications, with a sip of water before your appointment  Diabetics should hold regular insulin (if taken separately) and take 1/2 normal NPH dos the morning of the procedure.  Carry some sugar containing items with you to your appointment. A driver must accompany you and be prepared to drive you home after your procedure.  Bring all your current medications with your. An IV may be inserted and sedation may be given at the discretion of the physician.   A blood pressure cuff, EKG and other monitors will  often be applied during the procedure.  Some patients may need to have extra oxygen administered for a short period. You will be asked to provide medical information, including your allergies, prior to the procedure.  We must know immediately if you are taking blood thinners (like Coumadin/Warfarin)  Or if you are allergic to IV iodine contrast (dye). We must know if you could possible be pregnant.  Possible side-effects: Bleeding from needle site Infection (rare, may require surgery) Nerve injury (rare) Numbness & tingling (temporary) Difficulty urinating (rare, temporary) Spinal headache ( a headache worse with upright posture) Light -headedness (temporary) Pain at injection site (several days) Decreased blood pressure (temporary) Weakness in arm/leg (temporary) Pressure sensation in back/neck (temporary)  Call if you experience: Fever/chills associated with headache or increased back/neck pain. Headache worsened by an upright position. New onset weakness or numbness of an extremity below the injection site Hives or difficulty breathing (go to the emergency room) Inflammation or drainage at the infection site Severe back/neck pain Any new symptoms which are concerning to you  Please note:  Although the local anesthetic injected can often make your back or neck feel good for several hours after the injection, the pain will likely return.  It takes 3-7 days for steroids to work in the epidural space.  You may not notice any pain relief for at least that one week.  If effective, we will often do a series of three injections spaced 3-6 weeks apart to maximally decrease your pain.  After the initial series, we generally will wait several months before   considering a repeat injection of the same type.  If you have any questions, please call [phone] [facility] 

## 2021-10-17 NOTE — Progress Notes (Signed)
PROVIDER NOTE: Information contained herein reflects review and annotations entered in association with encounter. Interpretation of such information and data should be left to medically-trained personnel. Information provided to patient can be located elsewhere in the medical record under "Patient Instructions". Document created using STT-dictation technology, any transcriptional errors that may result from process are unintentional.    Patient: Thomas Cole  Service Category: E/M  Provider: Gillis Santa, MD  DOB: Feb 12, 1971  DOS: 10/17/2021  Specialty: Interventional Pain Management  MRN: [id number]  Setting: Ambulatory outpatient  PCP: Derinda Late, MD  Type: Established Patient    Referring Provider: Derinda Late, MD  Location: Office  Delivery: Face-to-face     HPI  [name], a 51 y.o. year old male, is here today because of his Lumbar radiculopathy [M54.16]. [name] primary complain today is Neck Pain and Back Pain (lower)  Last encounter: My last encounter with him was on 09/04/21 Pertinent problems: Thomas Cole has Spondylosis, cervical, with myelopathy; Migraines; History of seizures; Generalized anxiety disorder; and Bilateral occipital neuralgia on their pertinent problem list. Pain Assessment: Severity of Chronic pain is reported as a 5 /10. Location:    /down back down left leg to thigh. Onset: More than a month ago. Quality: Burning, Pins and needles. Timing: Constant. Modifying factor(s): procedures, acupuncture. Vitals:  height is 6' 2"  (1.88 m) and weight is 312 lb (141.5 kg) (abnormal). His temporal temperature is 97.1 F (36.2 C) (abnormal). His blood pressure is 125/79 and his pulse is 90. His respiration is 16 and oxygen saturation is 100%.   Reason for encounter: both, medication management and post-procedure assessment.  Thomas Cole presents today for medication management and postprocedural evaluation.  He does note benefit with this prior lumbar epidural steroid  injection.  He states that he is able to walk more comfortably and has less overall pain.  He does find his pain returning.  He has been working with an Systems developer which she finds beneficial as well.  He states that his foot drop is eliminated for couple of hours after his acupuncture session.  He would like to repeat a lumbar epidural steroid injection but I advised Thomas Cole that we have to wait 3 months from his previous 1.  This will be after September 5  He also has tightness of his trapezius and right paraspinal muscles.  We discussed stretching exercises for this along with heat and Voltaren gel application.  We can also consider right cervical and trapezius trigger point injection when he follows up for his lumbar ESI  Post-procedure evaluation   Type: Lumbar epidural steroid injection (LESI) (interlaminar) #2    Laterality: Left   Level:  L3-4 Level.  Imaging: Fluoroscopic guidance Anesthesia: Local anesthesia (1-2% Lidocaine) Anxiolysis: Oral Valium 10 mg Sedation:  minimal . DOS: 09/04/2021  Performed by: Gillis Santa, MD  Purpose: Diagnostic/Therapeutic Indications: Lumbar radicular pain of intraspinal etiology of more than 4 weeks that has failed to respond to conservative therapy and is severe enough to impact quality of life or function. 1. Lumbar radiculopathy (L>R)   2. Chronic radicular lumbar pain   3. Neuroforaminal stenosis of lumbar spine (L3,4,5)   4. Chronic pain syndrome     NAS-11 Pain score:   Pre-procedure: 8 /10   Post-procedure: 8 /10      Effectiveness:  Initial hour after procedure: 100 %  Subsequent 4-6 hours post-procedure: 100 %  Analgesia past initial 6 hours: 30 %  Ongoing improvement:  Analgesic:  30-40% Function:  Somewhat improved ROM: Somewhat improved       Pharmacotherapy Assessment  Analgesic: Hydrocodone extended release 10 mg twice daily, hydrocodone immediate release 10 mg 3 times daily as needed for breakthrough pain     Monitoring:Hydrocodone extended release 10 mg twice daily, hydrocodone immediate release 10 mg 3 times daily as needed for breakthrough pain    PMP: PDMP reviewed during this encounter.       Pharmacotherapy: No side-effects or adverse reactions reported. Compliance: No problems identified. Effectiveness: Clinically acceptable.  UDS:  Summary  Date Value Ref Range Status  10/26/2020 Note  Final    Comment:    ==================================================================== [employer] Select 13 (MW) ==================================================================== Test                             Result       Flag       Units  Drug Present and Declared for Prescription Verification   Hydrocodone                    349          EXPECTED   ng/mg creat   Hydromorphone                  95           EXPECTED   ng/mg creat   Dihydrocodeine                 108          EXPECTED   ng/mg creat   Norhydrocodone                 1578         EXPECTED   ng/mg creat    Sources of hydrocodone include scheduled prescription medications.    Hydromorphone, dihydrocodeine and norhydrocodone are expected    metabolites of hydrocodone. Hydromorphone and dihydrocodeine are    also available as scheduled prescription medications.  Drug Absent but Declared for Prescription Verification   Clonazepam                     Not Detected UNEXPECTED ng/mg creat   Butalbital                     Not Detected UNEXPECTED ==================================================================== Test                      Result    Flag   Units      Ref Range   Creatinine              65               mg/dL      >=20 ==================================================================== Declared Medications:  The flagging and interpretation on this report are based on the  following declared medications.  Unexpected results may arise from  inaccuracies in the declared medications.   **Note: The testing scope of  this panel includes these medications:   Butalbital (Fioricet)  Clonazepam (Klonopin)  Hydrocodone  Hydrocodone (Norco)   **Note: The testing scope of this panel does not include the  following reported medications:   Acetaminophen (Fioricet)  Acetaminophen (Norco)  Caffeine (Fioricet)  Carbidopa (Sinemet)  Cyanocobalamin  Duloxetine (Cymbalta)  Entacapone  Glipizide  Hydrochlorothiazide (Hydrodiuril)  Levodopa (Sinemet)  Losartan (Cozaar)  Metformin (Glucophage)  Nortriptyline (Pamelor)  Omeprazole (Prilosec)  Phenytoin (Dilantin)  Pregabalin (Lyrica)  Propranolol (Inderal)  Sucralfate (Carafate)  Tizanidine (Zanaflex)  Vitamin D3 ==================================================================== For clinical consultation, please call [phone]. ====================================================================       ROS  Constitutional: Denies any fever or chills Gastrointestinal: No reported hemesis, hematochezia, vomiting, or acute GI distress Musculoskeletal:  cervicalgia  L>right hip pain; low back pain with radiation into left leg Neurological: No reported episodes of acute onset apraxia, aphasia, dysarthria, agnosia, amnesia, paralysis, loss of coordination, or loss of consciousness  Medication Review  DULoxetine, HYDROcodone Bitartrate ER, HYDROcodone-acetaminophen, Ubrogepant, Vitamin D3, carbidopa-levodopa, clonazePAM, furosemide, glipiZIDE, hydrochlorothiazide, losartan, metFORMIN, metoprolol succinate, nortriptyline, omeprazole, phenytoin, pregabalin, tiZANidine, and vitamin B-12  History Review  Allergy: [name] is allergic to amitriptyline, bee venom, chlorhexidine, carbamazepine, meloxicam, morphine and related, and sulfa antibiotics. Drug: [name]  reports no history of drug use. Alcohol:  reports no history of alcohol use. Tobacco:  reports that he has never smoked. He has never used smokeless tobacco. Social: [name]  reports  that he has never smoked. He has never used smokeless tobacco. He reports that he does not drink alcohol and does not use drugs. Medical:  has a past medical history of Anxiety, Asthma, Back problem, Benign essential hypertension (11/30/2016), Cervical spondylosis without myelopathy (12/20/1005), Complication of anesthesia, Depression, Diabetes mellitus without complication ([place]) (03/1974), GERD (gastroesophageal reflux disease), Hypertension, Labile hypertension (03/19/2013), Major depressive disorder, recurrent episode, severe ([place]) (06/23/2015), Migraine headache, Migraines (11/11/2013), S/P appendectomy (04/09/2011), Seizure ([place]) (11/30/2016), Seizures ([place]), Shortness of breath, and SOB (shortness of breath) (08/06/2014). Surgical: [name]  has a past surgical history that includes Knee surgery; Posterior fusion cervical spine; Cervical disc surgery; Appendectomy (12); Anterior cervical corpectomy (N/A, 09/15/2012); Cholecystectomy (N/A, 03/29/2017); Colonoscopy with propofol (N/A, 10/23/2019); and Esophagogastroduodenoscopy (egd) with propofol (N/A, 10/23/2019). Family: family history includes Arthritis in his father, maternal grandfather, maternal grandmother, mother, paternal grandfather, and paternal grandmother; Asthma in his mother; Coronary artery disease in his father and mother; Heart disease in his father, maternal grandfather, maternal grandmother, paternal grandfather, and paternal grandmother; Hypertension in his maternal grandfather, maternal grandmother, mother, paternal grandfather, and paternal grandmother; Mental illness in his mother.  Laboratory Chemistry Profile   Renal Lab Results  Component Value Date   BUN 13 04/21/2017   CREATININE 0.93 04/21/2017   LABCREA 79.3 03/05/2014   GFR 82.19 08/05/2014   GFRAA >60 04/21/2017   GFRNONAA >60 04/21/2017     Hepatic Lab Results  Component Value Date   AST 30 04/21/2017   ALT 23 04/21/2017   ALBUMIN 4.2 04/21/2017   ALKPHOS 86  04/21/2017   LIPASE 51 04/21/2017     Electrolytes Lab Results  Component Value Date   NA 138 04/21/2017   K 3.7 04/21/2017   CL 101 04/21/2017   CALCIUM 9.4 04/21/2017   MG 1.9 04/26/2020     Bone Lab Results  Component Value Date   VD25OH 31.95 03/05/2016   25OHVITD1 35 04/26/2020   25OHVITD2 <1.0 04/26/2020   25OHVITD3 35 04/26/2020     Inflammation (CRP: Acute Phase) (ESR: Chronic Phase) Lab Results  Component Value Date   CRP 10 04/26/2020   ESRSEDRATE 22 04/26/2020       Note: Above Lab results reviewed.  Recent Imaging Review  DG PAIN CLINIC C-ARM 1-60 MIN NO REPORT Fluoro was used, but no Radiologist interpretation will be provided.  Please refer to "NOTES" tab for provider progress note.  Note: Reviewed        Physical Exam  General appearance:  Well nourished, well developed, and well hydrated. In no apparent acute distress Mental status: Alert, oriented x 3 (person, place, & time)       Respiratory: No evidence of acute respiratory distress Eyes: PERLA Vitals: BP 125/79   Pulse 90   Temp (!) 97.1 F (36.2 C) (Temporal)   Resp 16   Ht 6' 2"  (1.88 m)   Wt (!) 312 lb (141.5 kg)   SpO2 100%   BMI 40.06 kg/m  BMI: Estimated body mass index is 40.06 kg/m as calculated from the following:   Height as of this encounter: 6' 2"  (1.88 m).   Weight as of this encounter: 312 lb (141.5 kg). Ideal: Ideal body weight: 82.2 kg (181 lb 3.5 oz) Adjusted ideal body weight: 105.9 kg (233 lb 8.5 oz)  Cervical Spine Area Exam  Skin & Axial Inspection: Well healed scar from previous spine surgery detected Alignment: Symmetrical Functional ROM: Pain restricted ROM, bilaterally Stability: No instability detected Muscle Tone/Strength: Functionally intact. No obvious neuro-muscular anomalies detected. Sensory (Neurological): Dermatomal pain pattern and neurogenic Palpation: No palpable anomalies               Upper Extremity (UE) Exam      Side: Right upper  extremity   Side: Left upper extremity  Skin & Extremity Inspection: Skin color, temperature, and hair growth are WNL. No peripheral edema or cyanosis. No masses, redness, swelling, asymmetry, or associated skin lesions. No contractures.   Skin & Extremity Inspection: Skin color, temperature, and hair growth are WNL. No peripheral edema or cyanosis. No masses, redness, swelling, asymmetry, or associated skin lesions. No contractures.  Functional ROM: Pain restricted ROM for shoulder and elbow   Functional ROM: Pain restricted ROM for shoulder and elbow  Muscle Tone/Strength: Functionally intact. No obvious neuro-muscular anomalies detected.   Muscle Tone/Strength: Functionally intact. No obvious neuro-muscular anomalies detected.  Sensory (Neurological): Unimpaired           Sensory (Neurological): Unimpaired          Palpation: No palpable anomalies               Palpation: No palpable anomalies              Provocative Test(s):  Phalen's test: deferred Tinel's test: deferred Apley's scratch test (touch opposite shoulder):  Action 1 (Across chest): Decreased ROM Action 2 (Overhead): Decreased ROM Action 3 (LB reach): Decreased ROM       Provocative Test(s):  Phalen's test: deferred Tinel's test: deferred Apley's scratch test (touch opposite shoulder):  Action 1 (Across chest): Decreased ROM Action 2 (Overhead): Decreased ROM Action 3 (LB reach): Decreased ROM     Lumbar Spine Area Exam  Skin & Axial Inspection: No masses, redness, or swelling Alignment: Symmetrical Functional ROM: Pain restricted ROM       Stability: No instability detected Muscle Tone/Strength: Functionally intact. No obvious neuro-muscular anomalies detected. Sensory (Neurological): Dermatomal pain pattern left greater than right   Gait & Posture Assessment  Ambulation: Patient came in today in a wheel chair Gait: Antalgic gait (limping) Posture: Difficulty standing up straight, due to pain  Lower Extremity  Exam    Side: Right lower extremity  Side: Left lower extremity  Stability: No instability observed          Stability: No instability observed          Skin & Extremity Inspection: Skin color, temperature, and hair growth are WNL. No peripheral edema or cyanosis. No masses, redness,  swelling, asymmetry, or associated skin lesions. No contractures.  Skin & Extremity Inspection: Skin color, temperature, and hair growth are WNL. No peripheral edema or cyanosis. No masses, redness, swelling, asymmetry, or associated skin lesions. No contractures.  Functional ROM: Unrestricted ROM                  Functional ROM: Pain restricted ROM for hip and knee joints Limited SLR (straight leg raise)  Muscle Tone/Strength: Functionally intact. No obvious neuro-muscular anomalies detected.  Muscle Tone/Strength: Functionally intact. No obvious neuro-muscular anomalies detected.  Sensory (Neurological): Unimpaired        Sensory (Neurological): Dermatomal pain pattern        DTR: Patellar: deferred today Achilles: deferred today Plantar: deferred today  DTR: Patellar: deferred today Achilles: deferred today Plantar: deferred today  Palpation: No palpable anomalies  Palpation: No palpable anomalies     Assessment   Status Diagnosis  Responding Responding Persistent 1. Lumbar radiculopathy (L>R)   2. Chronic radicular lumbar pain   3. Neuroforaminal stenosis of lumbar spine (L3,4,5)   4. Migraine without aura and without status migrainosus, not intractable   5. Spinal stenosis, lumbar region, with neurogenic claudication   6. Chronic pain syndrome   7. Thoracic radiculopathy   8. Myelopathy of cervical spinal cord with cervical radiculopathy (HCC)        Plan of Care  Thomas Cole has a current medication list which includes the following long-term medication(s): carbidopa-levodopa, carbidopa-levodopa, clonazepam, duloxetine, glipizide, metformin, metoprolol succinate, nortriptyline, [START ON  10/23/2021] hydrocodone-acetaminophen, [START ON 11/22/2021] hydrocodone-acetaminophen, [START ON 12/22/2021] hydrocodone-acetaminophen, and pregabalin.  Pharmacotherapy (Medications Ordered): Meds ordered this encounter  Medications   HYDROcodone Bitartrate ER 10 MG CP12    Sig: Take 10 mg by mouth every 12 (twelve) hours. Must last 30 days.    Dispense:  60 capsule    Refill:  0    Chronic Pain: STOP Act (Not applicable) Fill 1 day early if closed on refill date. Avoid benzodiazepines within 8 hours of opioids   HYDROcodone Bitartrate ER 10 MG CP12    Sig: Take 10 mg by mouth every 12 (twelve) hours. Must last 30 days.    Dispense:  60 capsule    Refill:  0    Chronic Pain: STOP Act (Not applicable) Fill 1 day early if closed on refill date. Avoid benzodiazepines within 8 hours of opioids   HYDROcodone Bitartrate ER 10 MG CP12    Sig: Take 10 mg by mouth every 12 (twelve) hours. Must last 30 days.    Dispense:  60 capsule    Refill:  0    Chronic Pain: STOP Act (Not applicable) Fill 1 day early if closed on refill date. Avoid benzodiazepines within 8 hours of opioids   HYDROcodone-acetaminophen (NORCO) 10-325 MG tablet    Sig: Take 1 tablet by mouth every 8 (eight) hours as needed. For chronic pain syndrome. Each Rx to last 30 days.    Dispense:  90 tablet    Refill:  0   HYDROcodone-acetaminophen (NORCO) 10-325 MG tablet    Sig: Take 1 tablet by mouth every 8 (eight) hours as needed. For chronic pain syndrome. Each Rx to last 30 days.    Dispense:  90 tablet    Refill:  0   HYDROcodone-acetaminophen (NORCO) 10-325 MG tablet    Sig: Take 1 tablet by mouth every 8 (eight) hours as needed. For chronic pain syndrome. Each Rx to last 30 days.  Dispense:  90 tablet    Refill:  0   Ubrogepant (UBRELVY) 100 MG TABS    Sig: 50 to 100 mg (0.5 - 1 tablet) as a single dose; if symptoms persist or return, may repeat dose after =2 hours. Do not exceed 200 mg daily dose    Dispense:  10 tablet     Refill:  2   pregabalin (LYRICA) 100 MG capsule    Sig: Take 1 capsule (100 mg total) by mouth 3 (three) times daily.    Dispense:  90 capsule    Refill:  5    Do not place this medication, or any other prescription from our practice, on "Automatic Refill". Patient may have prescription filled one day early if pharmacy is closed on scheduled refill date.   Orders Placed This Encounter  Procedures   Lumbar Epidural Injection    Standing Status:   Future    Standing Expiration Date:   04/19/2022    Scheduling Instructions:     Procedure: Interlaminar Lumbar Epidural Steroid injection (LESI): Left L3/4            Laterality: Midline     Sedation: PO Valium     Timeframe: after Sept 5    Order Specific Question:   Where will this procedure be performed?    Answer:   [facility]   TRIGGER POINT INJECTION    Standing Status:   Future    Standing Expiration Date:   01/17/2022    Scheduling Instructions:     Right cervical    Order Specific Question:   Where will this procedure be performed?    Answer:   [facility]   Compliance Drug Analysis, Ur    Volume: 30 ml(s). Minimum 3 ml of urine is needed. Document temperature of fresh sample. Indications: Long term (current) use of opiate analgesic (O03.559) Test#: [id number] (Comprehensive Profile)    Order Specific Question:   Release to patient    Answer:   Immediate     Follow-up plan:   Return in about 7 weeks (around 12/06/2021) for L-ESI and Right cervical TPI.     Diagnostic cervical facet medial branch nerve blocks C3-C7 b/l 05/06/19- did not help Diagnostic thoracic facet medial branch nerve blocks T1-T2, consider bilateral occipital nerve block  Cervical/trapezius trigger point injections SPG block                 Recent Visits Date Type Provider Dept  09/04/21 Procedure visit Gillis Santa, MD [facility]  08/17/21 Office Visit Gillis Santa, MD [facility]  07/24/21 Procedure visit  Gillis Santa, MD [facility]  Showing recent visits within past 90 days and meeting all other requirements Today's Visits Date Type Provider Dept  10/17/21 Office Visit Gillis Santa, MD [facility]  Showing today's visits and meeting all other requirements Future Appointments Date Type Provider Dept  12/11/21 Appointment Gillis Santa, MD [facility]  Showing future appointments within next 90 days and meeting all other requirements  I discussed the assessment and treatment plan with the patient. The patient was provided an opportunity to ask questions and all were answered. The patient agreed with the plan and demonstrated an understanding of the instructions.  Patient advised to call back or seek an in-person evaluation if the symptoms or condition worsens.  Duration of encounter: 30 minutes.  Note by: Gillis Santa, MD Date: 10/17/2021; Time: 1:37 PM

## 2021-10-17 NOTE — Progress Notes (Signed)
Nursing Pain Medication Assessment:  Safety precautions to be maintained throughout the outpatient stay will include: orient to surroundings, keep bed in low position, maintain call bell within reach at all times, provide assistance with transfer out of bed and ambulation.  Medication Inspection Compliance: Pill count conducted under aseptic conditions, in front of the patient. Neither the pills nor the bottle was removed from the patient's sight at any time. Once count was completed pills were immediately returned to the patient in their original bottle.  Medication #1:  Hydrocodone ER Pill/Patch Count:  25 of 62 pills remain Pill/Patch Appearance: Markings consistent with prescribed medication Bottle Appearance: QUBE dosepack Filled Date: 06 / 29 / 2023 Last Medication intake:  Today  Medication #2: Hydrocodone/APAP Pill/Patch Count:  38 of 93 pills remain Pill/Patch Appearance: Markings consistent with prescribed medication Bottle Appearance: QUBE dosepack Filled Date: 06 / 29 / 2023 Last Medication intake:  Today

## 2021-10-21 LAB — COMPLIANCE DRUG ANALYSIS, UR

## 2021-11-13 ENCOUNTER — Ambulatory Visit: Payer: Medicare Other | Attending: Neurology

## 2021-11-13 DIAGNOSIS — Z7409 Other reduced mobility: Secondary | ICD-10-CM | POA: Insufficient documentation

## 2021-11-13 DIAGNOSIS — M25611 Stiffness of right shoulder, not elsewhere classified: Secondary | ICD-10-CM | POA: Insufficient documentation

## 2021-11-13 DIAGNOSIS — M5382 Other specified dorsopathies, cervical region: Secondary | ICD-10-CM | POA: Insufficient documentation

## 2021-11-13 DIAGNOSIS — M542 Cervicalgia: Secondary | ICD-10-CM | POA: Diagnosis present

## 2021-11-13 DIAGNOSIS — M25612 Stiffness of left shoulder, not elsewhere classified: Secondary | ICD-10-CM | POA: Insufficient documentation

## 2021-11-13 DIAGNOSIS — M792 Neuralgia and neuritis, unspecified: Secondary | ICD-10-CM | POA: Diagnosis present

## 2021-11-13 NOTE — Therapy (Signed)
OUTPATIENT PHYSICAL THERAPY THORACOLUMBAR EVALUATION   Patient Name: Thomas Cole MRN: [id number] DOB:Jul 15, 1970, 51 y.o., male Today's Date: 11/13/2021   PT End of Session - 11/13/21 1441     Visit Number 1    Date for PT Re-Evaluation 12/25/21    Authorization Type [place]    PT Start Time 1108    PT Stop Time 1210    PT Time Calculation (min) 62 min    Activity Tolerance Patient tolerated treatment well    Behavior During Therapy [facility] for tasks assessed/performed             Past Medical History:  Diagnosis Date   Anxiety    Asthma    Back problem    Benign essential hypertension 11/30/2016   Cervical spondylosis without myelopathy 9/50/9326   Complication of anesthesia    occ takes longer to wake   Depression    Diabetes mellitus without complication ([place]) 71/2458   Type 2   GERD (gastroesophageal reflux disease)    Hypertension    Labile hypertension 03/19/2013   No current neuro changes, headache much improved. No clear sign of intracranial bleed. NO indication for head CT>  BP much better now... Fluctuates a lot.  Will eval for pheochromocytoma. As recommended at last cardiology OV.. Will prescribe hydralazine to use as needed for BP fluctuations.  Close follow up with PCP.    Major depressive disorder, recurrent episode, severe ([place]) 06/23/2015   Migraine headache    Migraines 11/11/2013   S/P appendectomy 04/09/2011   Seizure ([place]) 11/30/2016   Seizures ([place])    last 87   Shortness of breath    SOB (shortness of breath) 08/06/2014   - [facility] eval with cpst 02/20/2008 exercise testing demonstrateed normal functional capabilities and a normal cardiopulmonary response to exercise. - 09/16/2014  Walked RA x 3 laps @ 185 ft each stopped due to end of study/ nl pace/ no desat  - spirometry 09/16/2014 > no obst/ min restriction      Past Surgical History:  Procedure Laterality Date   ANTERIOR CERVICAL CORPECTOMY N/A 09/15/2012   Procedure: Cervical Three-Four,Cervical Six-Seven  Anterior cervical decompression/diskectomy fusion with Cervical Four Corpectomy;  Surgeon: Kristeen Miss, MD;  Location: [place] [facility];  Service: Neurosurgery;  Laterality: N/A;  Cervical Three-Four,Cervical Six-Seven  Anterior cervical decompression/diskectomy fusion with Cervical four Corpectomy   APPENDECTOMY  12   CERVICAL DISC SURGERY     CHOLECYSTECTOMY N/A 03/29/2017   Procedure: LAPAROSCOPIC CHOLECYSTECTOMY;  Surgeon: Florene Glen, MD;  Location: [facility] ORS;  Service: General;  Laterality: N/A;   COLONOSCOPY WITH PROPOFOL N/A 10/23/2019   Procedure: COLONOSCOPY WITH PROPOFOL;  Surgeon: Robert Bellow, MD;  Location: [facility] ENDOSCOPY;  Service: Endoscopy;  Laterality: N/A;   ESOPHAGOGASTRODUODENOSCOPY (EGD) WITH PROPOFOL N/A 10/23/2019   Procedure: ESOPHAGOGASTRODUODENOSCOPY (EGD) WITH PROPOFOL;  Surgeon: Robert Bellow, MD;  Location: [facility] ENDOSCOPY;  Service: Endoscopy;  Laterality: N/A;   KNEE SURGERY     bilateral   POSTERIOR FUSION CERVICAL SPINE     Patient Active Problem List   Diagnosis Date Noted   Spinal stenosis, lumbar region, with neurogenic claudication 07/04/2021   Chronic radicular lumbar pain 07/04/2021   Neuroforaminal stenosis of lumbar spine (L3,4,5) 07/04/2021   Thoracic facet joint syndrome 09/24/2019   Chronic pain syndrome 07/14/2019   Failed cervical fusion 11/20/2018   Episode of recurrent major depressive disorder ([place]) 11/20/2018   Myelopathy of cervical spinal cord with cervical radiculopathy ([place]) 05/13/2018   Bilateral  occipital neuralgia 02/11/2018   Degeneration of cervical intervertebral disc with myelopathy 06/13/2017   H/O cervical spinal arthrodesis 05/16/2017   Thoracic radiculopathy 05/16/2017   Acute cholecystitis 03/29/2017   Anxiety 11/30/2016   Benign essential hypertension 11/30/2016   Seizure ([place]) 11/30/2016   Localization-related symptomatic epilepsy and epileptic syndromes with complex partial seizures, not intractable,  without status epilepticus ([place]) 02/21/2016   Generalized anxiety disorder 06/23/2015   Major depressive disorder, recurrent episode, severe ([place]) 06/23/2015   PTSD (post-traumatic stress disorder) 06/16/2015   SOB (shortness of breath) 08/06/2014   Obesity (BMI 30-39.9) 11/11/2013   Migraines 11/11/2013   Asthma, mild intermittent 11/11/2013   History of seizures 11/11/2013   GERD without esophagitis 11/11/2013   Depression 11/11/2013   Labile hypertension 03/19/2013   Spondylosis, cervical, with myelopathy 09/17/2012   S/P appendectomy 04/09/2011   Sleep apnea 05/25/2010    PCP: Derinda Late, MD  REFERRING PROVIDER: Vladimir Crofts, MD  REFERRING DIAG: Parkinson's Disease  Rationale for Evaluation and Treatment Rehabilitation  THERAPY DIAG:  Impaired functional mobility, balance, gait, and endurance  Impaired range of motion of cervical spine  Impaired range of motion of both shoulders  Neuralgia and neuritis  ONSET DATE: 2013-2014  SUBJECTIVE:                                                                                                                                                                                           SUBJECTIVE STATEMENT: Pt arrives to OPPT with c/o cervical and low back pain. Pt notes that this has been an ongoing problem for years, and it has been affecting his Independence at home, as well as his quality of life. Per pt, the MD said "he's one bad fall away from being done" as he states his MD has concern for myelomalacia. Pt notes that symptoms are worse on the L arm and L leg. Pt enjoys being outside and working on cars. His significant other states he does daily walks down the driveway.  PERTINENT HISTORY:  Pt with extensive medical history concerning cervical, thoracic, and lumbar spine diagnoses including: thoracic radiculopathy, myelopathy of cervical spine cord, chronic pain syndrome, thoracic facet joint syndrome, hx lumbar laminectomy  (1994), hx cervical fusion x2 (C3-4 ~36yr ago, C3-7 ~7-828yrago). Per pt, his MD has concern for myelomalacia of lower spinal cord. Pt with recent hospital admission in January of this year due to acute/chronic low back pain. Pt notes episode of bowel/bladder incontinence, but denies saddle anesthesia.  PAIN:  Are you having pain? Yes: NPRS scale: 6-7/10 Pain location: neck, back, lats   PRECAUTIONS: Fall  WEIGHT BEARING RESTRICTIONS  No  FALLS:  Has patient fallen in last 6 months? Yes. Number of falls 1;  sitting on rollator without breaks locked; lost balance and fell off rollator  LIVING ENVIRONMENT: Lives with: lives alone Lives in: House/apartment Stairs: Yes: External: 3-4 steps; on left going up Has following equipment at home: Single point cane, Environmental consultant - 4 wheeled, Wheelchair (power), Wheelchair (manual), shower chair, and Grab bars. Lift chair recliner.  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 Severe spinal canal stenosis at the L2-L5 levels due to combination of disc bulges and congenitally narrow spinal canal. 2. Moderate right and severe left neural foraminal stenosis at L4-5 and moderate left neural foraminal stenosis at L5-S1. 3. No thoracic spinal canal or neural foraminal stenosis.  (04/28/21) MRI THORACIC AND LUMBAR SPINE WITHOUT CONTRAST IMPRESSION: 1. Severe spinal canal stenosis at the L2-L5 levels due to combination of disc bulges and congenitally narrow spinal canal. 2. Moderate right and severe left neural foraminal stenosis at L4-5 and moderate left neural foraminal stenosis at L5-S1. 3. No thoracic spinal canal or neural foraminal stenosis.  PATIENT SURVEYS:  FOTO 35%  SCREENING FOR RED  FLAGS: Bowel or bladder incontinence: Yes: episode of bowel/bladder incontinence 3-15moago (possibly food related for bowels) Spinal tumors: No Cauda equina syndrome: No Compression fracture: No Abdominal aneurysm: No  COGNITION:  Overall cognitive status: Within functional limits for tasks assessed     SENSATION: Light touch: diminished sensation LLE compared to RLE Touch discrimination: intact bil  PALPATION: Increased muscle tension noted to posterior cervical musculature, bil traps, scalenes, levator scapulae, multifidi, and latissimus dorsi. Improved muscle tension noted with STM to posterior cervical muscular and bil traps.   COORDINATION:   Opposition: intact (R); bradykinetic (L) Finger to Nose: bradykinetic and undershooting (R); bradykinetic and undershooting (L)   Alternating Toe Tap: intact   Heel to Shin: intact bil, limited hip flexion ROM  CERVICAL ROM: (hx multiple cervical fusions)  Active  AROM  eval  Flexion 20deg  Extension 4deg  Right lateral flexion 10deg  Left lateral flexion 15deg  Right rotation 20deg  Left rotation 15deg, p!   (Blank rows = not tested)  UPPER EXTREMITY ROM:    Active  Right eval Left eval  Shoulder flexion 110 110    LOWER EXTREMITY ROM:    Limited seated hip flexion AROM (not associated with strength) HS length equal/WFL bil, no c/o neural tension Functionally observed limited hip ext bil  LOWER EXTREMITY MMT:    MMT Right eval Left eval  Hip flexion 4 4+  Hip extension    Hip abduction    Hip adduction    Hip internal rotation    Hip external rotation    Knee flexion 4- 4  Knee extension 4 4+  Ankle dorsiflexion    Ankle plantarflexion    Ankle inversion    Ankle eversion     (Blank rows = not tested)   FUNCTIONAL TESTS:  NT due to time constraint; will benefit from functional test at follow up session  GAIT: Distance walked: [id number] Assistive device utilized: Single point cane Level of assistance:  SBA Comments: limited hip ext bil, decreased R arm swing, SPC in L hand, decreased foot clearance bil, decreased step length, guarded movement  TODAY'S TREATMENT  Therapeutic Exercise:  Bil upper trap stretch, 1x20s ea  Bil scalene stretch, 1x20s ea  Bil levator scapulae stretch, 1x20s ea  Manual Therapy:  STM to posterior cervical musculature, bil traps, and levator scapulae. Demonstrates improved muscle tone s/p intervention.   PATIENT EDUCATION:  Education details: HEP, diagnosis, prognosis for lifting, pain management, activity level Person educated: Patient and girlfriend Education method: Explanation, Demonstration, Tactile cues, Verbal cues, and Handouts Education comprehension: verbalized understanding, verbal cues required, tactile cues required, and needs further education   HOME EXERCISE PROGRAM: Access Code: [email or web address] URL: [email or web address] Date: 11/13/2021 Prepared by: Amalia Hailey  Exercises - Seated Gentle Upper Trapezius Stretch  - 1 x daily - 7 x weekly - 2 sets - 8 reps - 20 hold - Seated Levator Scapulae Stretch  - 1 x daily - 7 x weekly - 2 sets - 8 reps - 20 hold - Seated Scalenes Stretch  - 1 x daily - 7 x weekly - 2 sets - 8 reps - 20 hold - Doorway Pec Stretch at 60 Elevation  - 1 x daily - 7 x weekly - 2 sets - 8 reps - 20 hold  ASSESSMENT:  CLINICAL IMPRESSION: Patient is a 51 y.o. M who was seen today for physical therapy evaluation and treatment for c/o neck and low back pain, resulting in radicular symptoms into BUE (L>R) and BLE (L>R). Pt with hx of multiple MSK diagnoses including: thoracic radiculopathy, myelopathy of cervical spine cord, chronic pain syndrome, thoracic facet joint syndrome, hx lumbar laminectomy, hx cervical fusion x2. Pt presents with the following objective impairments below, which affect activity and participation limitations stated below. Concern for arterial vs. neurogenic TOS due to c/o peripheral N/T and  "hands/fingers feeling cold" with UE movement; further testing/investigation required. Pt with significant mm tone around neck and shoulders that improved after STM. Current functional level indicates that pt is at increased risk of falls. Pt with fair therapy prognosis secondary to duration of symptoms, complicated medical history, and motivation. Pt will benefit from skilled OPPT services to address deficits, improve overall Ind and safety with mobility, and improve overall quality of life.   OBJECTIVE IMPAIRMENTS Abnormal gait, cardiopulmonary status limiting activity, decreased activity tolerance, decreased balance, decreased coordination, decreased endurance, decreased mobility, difficulty walking, decreased ROM, decreased strength, increased fascial restrictions, impaired perceived functional ability, increased muscle spasms, impaired flexibility, impaired sensation, impaired UE functional use, improper body mechanics, obesity, and pain.  ACTIVITY LIMITATIONS carrying, lifting, bending, standing, squatting, sleeping, stairs, transfers, bed mobility, continence, dressing, reach over head, hygiene/grooming, and locomotion level  PARTICIPATION LIMITATIONS: meal prep, cleaning, laundry, shopping, community activity, occupation, yard work, and lifting/exercise  PERSONAL FACTORS Time since onset of injury/illness/exacerbation and 3+ comorbidities: T2DM, HTN, Seizures  are also affecting patient's functional outcome.   REHAB POTENTIAL: Fair secondary to duration of symptoms and complicated MSK diagnosis hx  CLINICAL DECISION MAKING: Stable/uncomplicated  EVALUATION COMPLEXITY: Moderate   GOALS: Goals reviewed with patient? Yes  SHORT TERM GOALS: Target date: 12/11/2021  Pt will be Ind and compliant with HEP, to improve overall self-management of symptoms outside of therapy. Baseline: HEP provided Goal status: INITIAL   LONG TERM GOALS: Target date: 01/08/2022  Pt will report 2-3/10 pain at  it's worst, to improve overall tolerance for upright functional mobility, and to improve overall quality of life. Baseline: 6-7/10 Goal status: INITIAL  Pt will improve FOTO to target score to 44%, to demonstrate clinically significant improvement in perceived functional mobility. Baseline:  Goal status: INITIAL  Pt will be asymptomatic with ULTT indicating improved muscle/nerve tension, for improved tolerance and ability to perform ADL's and IADL's. Baseline: median (+) with elbow extension Goal status: INITIAL  4.  Pt will increase shoulder AROM by 30deg grossly, to improve tolerance and independence with ADL's, and IADL's. Baseline: shoulder flexion 110deg Goal status: INITIAL  PLAN: PT FREQUENCY: 2x/week  PT DURATION: 8 weeks  PLANNED INTERVENTIONS: Therapeutic exercises, Therapeutic activity, Neuromuscular re-education, Balance training, Gait training, Patient/Family education, Self Care, Joint mobilization, Stair training, DME instructions, Aquatic Therapy, Dry Needling, Electrical stimulation, Spinal mobilization, Cryotherapy, Moist heat, scar mobilization, Traction, Ultrasound, Manual therapy, and Re-evaluation.  PLAN FOR NEXT SESSION: Assess lumbar ROM, palpation of LE, hip joint mobility, functional outcome measures, arterial vs. Neurogenic/TOS testing   Herminio Commons, PT, DPT 3:44 PM,11/13/21

## 2021-11-20 ENCOUNTER — Encounter: Payer: Self-pay | Admitting: Physical Therapy

## 2021-11-20 ENCOUNTER — Ambulatory Visit: Payer: Medicare Other

## 2021-11-20 DIAGNOSIS — M25612 Stiffness of left shoulder, not elsewhere classified: Secondary | ICD-10-CM

## 2021-11-20 DIAGNOSIS — M792 Neuralgia and neuritis, unspecified: Secondary | ICD-10-CM

## 2021-11-20 DIAGNOSIS — M5382 Other specified dorsopathies, cervical region: Secondary | ICD-10-CM

## 2021-11-20 DIAGNOSIS — Z7409 Other reduced mobility: Secondary | ICD-10-CM

## 2021-11-20 NOTE — Therapy (Signed)
OUTPATIENT PHYSICAL THERAPY THORACOLUMBAR TREATMENT   Patient Name: Thomas Cole Cole MRN: [id number] DOB:Dec 03, 1970, 51 y.o., male Today's Date: 11/20/2021   PT End of Session - 11/20/21 1423     Visit Number 2    Date for PT Re-Evaluation 12/25/21    Authorization Type [place]    PT Start Time 1428    PT Stop Time 1513    PT Time Calculation (min) 45 min    Activity Tolerance Patient tolerated treatment well    Behavior During Therapy [facility] for tasks assessed/performed             Past Medical History:  Diagnosis Date   Anxiety    Asthma    Back problem    Benign essential hypertension 11/30/2016   Cervical spondylosis without myelopathy 0/86/5784   Complication of anesthesia    occ takes longer to wake   Depression    Diabetes mellitus without complication ([place]) 69/6295   Type 2   GERD (gastroesophageal reflux disease)    Hypertension    Labile hypertension 03/19/2013   No current neuro changes, headache much improved. No clear sign of intracranial bleed. NO indication for head CT>  BP much better now... Fluctuates a lot.  Will eval for pheochromocytoma. As recommended at last cardiology OV.. Will prescribe hydralazine to use as needed for BP fluctuations.  Close follow up with PCP.    Major depressive disorder, recurrent episode, severe ([place]) 06/23/2015   Migraine headache    Migraines 11/11/2013   S/P appendectomy 04/09/2011   Seizure ([place]) 11/30/2016   Seizures ([place])    last 87   Shortness of breath    SOB (shortness of breath) 08/06/2014   - [facility] eval with cpst 02/20/2008 exercise testing demonstrateed normal functional capabilities and a normal cardiopulmonary response to exercise. - 09/16/2014  Walked RA x 3 laps @ 185 ft each stopped due to end of study/ nl pace/ no desat  - spirometry 09/16/2014 > no obst/ min restriction      Past Surgical History:  Procedure Laterality Date   ANTERIOR CERVICAL CORPECTOMY N/A 09/15/2012   Procedure: Cervical Three-Four,Cervical Six-Seven  Anterior cervical decompression/diskectomy fusion with Cervical Four Corpectomy;  Surgeon: Kristeen Miss, MD;  Location: [place] [facility];  Service: Neurosurgery;  Laterality: N/A;  Cervical Three-Four,Cervical Six-Seven  Anterior cervical decompression/diskectomy fusion with Cervical four Corpectomy   APPENDECTOMY  12   CERVICAL DISC SURGERY     CHOLECYSTECTOMY N/A 03/29/2017   Procedure: LAPAROSCOPIC CHOLECYSTECTOMY;  Surgeon: Florene Glen, MD;  Location: [facility] ORS;  Service: General;  Laterality: N/A;   COLONOSCOPY WITH PROPOFOL N/A 10/23/2019   Procedure: COLONOSCOPY WITH PROPOFOL;  Surgeon: Robert Bellow, MD;  Location: [facility] ENDOSCOPY;  Service: Endoscopy;  Laterality: N/A;   ESOPHAGOGASTRODUODENOSCOPY (EGD) WITH PROPOFOL N/A 10/23/2019   Procedure: ESOPHAGOGASTRODUODENOSCOPY (EGD) WITH PROPOFOL;  Surgeon: Robert Bellow, MD;  Location: [facility] ENDOSCOPY;  Service: Endoscopy;  Laterality: N/A;   KNEE SURGERY     bilateral   POSTERIOR FUSION CERVICAL SPINE     Patient Active Problem List   Diagnosis Date Noted   Spinal stenosis, lumbar region, with neurogenic claudication 07/04/2021   Chronic radicular lumbar pain 07/04/2021   Neuroforaminal stenosis of lumbar spine (L3,4,5) 07/04/2021   Thoracic facet joint syndrome 09/24/2019   Chronic pain syndrome 07/14/2019   Failed cervical fusion 11/20/2018   Episode of recurrent major depressive disorder ([place]) 11/20/2018   Myelopathy of cervical spinal cord with cervical radiculopathy ([place]) 05/13/2018   Bilateral  occipital neuralgia 02/11/2018   Degeneration of cervical intervertebral disc with myelopathy 06/13/2017   H/O cervical spinal arthrodesis 05/16/2017   Thoracic radiculopathy 05/16/2017   Acute cholecystitis 03/29/2017   Anxiety 11/30/2016   Benign essential hypertension 11/30/2016   Seizure ([place]) 11/30/2016   Localization-related symptomatic epilepsy and epileptic syndromes with complex partial seizures, not intractable,  without status epilepticus ([place]) 02/21/2016   Generalized anxiety disorder 06/23/2015   Major depressive disorder, recurrent episode, severe ([place]) 06/23/2015   PTSD (post-traumatic stress disorder) 06/16/2015   SOB (shortness of breath) 08/06/2014   Obesity (BMI 30-39.9) 11/11/2013   Migraines 11/11/2013   Asthma, mild intermittent 11/11/2013   History of seizures 11/11/2013   GERD without esophagitis 11/11/2013   Depression 11/11/2013   Labile hypertension 03/19/2013   Spondylosis, cervical, with myelopathy 09/17/2012   S/P appendectomy 04/09/2011   Sleep apnea 05/25/2010    PCP: Derinda Late, MD  REFERRING PROVIDER: Vladimir Crofts, MD  REFERRING DIAG: Parkinson's Disease  Rationale for Evaluation and Treatment Rehabilitation  THERAPY DIAG:  Impaired range of motion of cervical spine  Impaired range of motion of both shoulders  Impaired functional mobility, balance, gait, and endurance  Neuralgia and neuritis  ONSET DATE: 2013-2014  SUBJECTIVE:                                                                                                                                                                                           SUBJECTIVE STATEMENT: Pt reports neck about a 6-7/10 NPS. Low back is just sore. Denies falls. Compliant with HEP.   PERTINENT HISTORY:  Pt with extensive medical history concerning cervical, thoracic, and lumbar spine diagnoses including: thoracic radiculopathy, myelopathy of cervical spine cord, chronic pain syndrome, thoracic facet joint syndrome, hx lumbar laminectomy (1994), hx cervical fusion x2 (C3-4 ~26yr ago, C3-7 ~7-836yrago). Per pt, his MD has concern for myelomalacia of lower spinal cord. Pt with recent hospital admission in January of this year due to acute/chronic low back pain. Pt notes episode of bowel/bladder incontinence, but denies saddle anesthesia.  Pt arrives to OPPT with c/o cervical and low back pain. Pt notes that this  has been an ongoing problem for years, and it has been affecting his Independence at home, as well as his quality of life. Per pt, the MD said "he's one bad fall away from being done" as he states his MD has concern for myelomalacia. Pt notes that symptoms are worse on the L arm and L leg. Pt enjoys being outside and working on cars. His significant other states he does daily walks down the driveway.  PAIN:  Are Thomas  having pain? Yes: NPRS scale: 6-7/10 Pain location: neck, back, lats   PRECAUTIONS: Fall  WEIGHT BEARING RESTRICTIONS No  FALLS:  Has patient fallen in last 6 months? Yes. Number of falls 1;  sitting on rollator without breaks locked; lost balance and fell off rollator  LIVING ENVIRONMENT: Lives with: lives alone Lives in: House/apartment Stairs: Yes: External: 3-4 steps; on left going up Has following equipment at home: Single point cane, Environmental consultant - 4 wheeled, Wheelchair (power), Wheelchair (manual), shower chair, and Grab bars. Lift chair recliner.  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 Severe spinal canal stenosis at the L2-L5 levels due to combination of disc bulges and congenitally narrow spinal canal. 2. Moderate right and severe left neural foraminal stenosis at L4-5 and moderate left neural foraminal stenosis at L5-S1. 3. No thoracic spinal canal or neural foraminal stenosis.  (04/28/21) MRI THORACIC AND LUMBAR SPINE WITHOUT CONTRAST IMPRESSION: 1. Severe spinal canal stenosis at the L2-L5 levels due to combination of disc bulges and congenitally narrow spinal canal. 2. Moderate right and severe left neural foraminal stenosis at L4-5 and moderate left neural foraminal stenosis at L5-S1. 3. No thoracic spinal  canal or neural foraminal stenosis.  PATIENT SURVEYS:  FOTO 35%  SCREENING FOR RED FLAGS: Bowel or bladder incontinence: Yes: episode of bowel/bladder incontinence 3-24moago (possibly food related for bowels) Spinal tumors: No Cauda equina syndrome: No Compression fracture: No Abdominal aneurysm: No  COGNITION:  Overall cognitive status: Within functional limits for tasks assessed     SENSATION: Light touch: diminished sensation LLE compared to RLE Touch discrimination: intact bil  PALPATION: Increased muscle tension noted to posterior cervical musculature, bil traps, scalenes, levator scapulae, multifidi, and latissimus dorsi. Improved muscle tension noted with STM to posterior cervical muscular and bil traps.   COORDINATION:   Opposition: intact (R); bradykinetic (L) Finger to Nose: bradykinetic and undershooting (R); bradykinetic and undershooting (L)   Alternating Toe Tap: intact   Heel to Shin: intact bil, limited hip flexion ROM  CERVICAL ROM: (hx multiple cervical fusions)  Active  AROM  eval  Flexion 20deg  Extension 4deg  Right lateral flexion 10deg  Left lateral flexion 15deg  Right rotation 20deg  Left rotation 15deg, p!   (Blank rows = not tested)  UPPER EXTREMITY ROM:    Active  Right eval Left eval  Shoulder flexion 110 110    LOWER EXTREMITY ROM:    Limited seated hip flexion AROM (not associated with strength) HS length equal/WFL bil, no c/o neural tension Functionally observed limited hip ext bil  LOWER EXTREMITY MMT:    MMT Right eval Left eval  Hip flexion 4 4+  Hip extension    Hip abduction    Hip adduction    Hip internal rotation    Hip external rotation    Knee flexion 4- 4  Knee extension 4 4+  Ankle dorsiflexion    Ankle plantarflexion    Ankle inversion    Ankle eversion     (Blank rows = not tested)   FUNCTIONAL TESTS:  11/20/21:    5xSTS: 44.44 sec without UE support  10 m gait speed self-selected pace:    With  SPC: .27 m/s   Without AD: .28 m/s  GAIT: Distance walked:  49f x2 Assistive device utilized: Single point cane Level of assistance: SBA Comments: limited hip ext bil, decreased R arm swing, SPC in L hand, decreased foot clearance bil, decreased step length, guarded movement    TODAY'S TREATMENT   11/20/21:  Therapeutic Exercise:  HIP AROM (R/L)  Flexion:  88/58  ER bilat WNL   IR bilat: significantly limited, firm end feel   FABER: negative bilat  FADDIR: negative bilat   AP hip joint spring testing: hypomobile but painless bilat  TOS testing  ROOS (R/L): Negative/ Negative    First rib spring testing: negative bilat   5xSTS: 44.44 sec without UE support    10 m gait speed self-selected pace:    With SPC: .27 m/s   Without AD: .28 m/s    REASSESS HEP. Performed 1 x20 sec each exercise except for pec stretch in doorway. Educated on trialing scalene stretch in supine with towel or pillow as occiput for leverage and gravity assist for increased tissue stretch due to limitations in cervical extension limiting stretch in sitting.   Access Code: [email or web address] URL: [email or web address] Date: 11/13/2021 Prepared by: SAmalia Hailey  Exercises - Seated Gentle Upper Trapezius Stretch  - 1 x daily - 7 x weekly - 2 sets - 8 reps - 20 hold - Seated Levator Scapulae Stretch  - 1 x daily - 7 x weekly - 2 sets - 8 reps - 20 hold - Seated Scalenes Stretch  - 1 x daily - 7 x weekly - 2 sets - 8 reps - 20 hold     - Doorway Pec Stretch at 60 Elevation  - 1 x daily - 7 x weekly - 2 sets - 8 reps - 20 hold  Seated LUE median nerve glides. Max TC's for sequencing cervical motion with LUE motion  BKTC with sheet for leverage in supine: x10  Updated HEP provided for median nerve glides and BKTC   PATIENT EDUCATION:  Education details: HEP updates, functional status with 5xSTS and gait speed Person educated: Patient and girlfriend Education method: Explanation,  Demonstration, Tactile cues, Verbal cues, and Handouts Education comprehension: verbalized understanding, verbal cues required, tactile cues required, and needs further education   HOME EXERCISE PROGRAM: Updated 11/20/21 Access Code: [email or web address] URL: [email or web address] Date: 11/20/2021 Prepared by: MLarna Daughters Exercises - Seated Gentle Upper Trapezius Stretch  - 1 x daily - 7 x weekly - 2 sets - 8 reps - 20 hold - Seated Levator Scapulae Stretch  - 1 x daily - 7 x weekly - 2 sets - 8 reps - 20 hold - Seated Scalenes Stretch  - 1 x daily - 7 x weekly - 2 sets - 8 reps - 20 hold - Doorway Pec Stretch at 60 Elevation  - 1 x daily - 7 x weekly - 2 sets - 8 reps - 20 hold - Median Nerve Glide  - 1 x daily - 7 x weekly - 2 sets - 10 reps - Supine Double Knee to Chest  - 1 x daily - 7 x weekly - 2 sets - 12 reps    Access Code: [email or web address] URL: [email or web address] Date: 11/13/2021 Prepared by: SAmalia Hailey Exercises - Seated Gentle Upper Trapezius Stretch  - 1 x daily - 7 x weekly - 2 sets - 8 reps - 20 hold - Seated Levator Scapulae Stretch  - 1 x daily - 7 x weekly - 2 sets - 8 reps - 20 hold - Seated Scalenes Stretch  - 1  x daily - 7 x weekly - 2 sets - 8 reps - 20 hold - Doorway Pec Stretch at 60 Elevation  - 1 x daily - 7 x weekly - 2 sets - 8 reps - 20 hold  ASSESSMENT:  CLINICAL IMPRESSION: Pt present for f/u since eval. Continuous testing performed to assess functional mobility and cervical/lumbar involvement. Pt at increased risk of falls as evidenced by 5xSTS and gait speed for age matched norms. Negative testing at TOS with ROOS test and also first rib springing and pec minor palpation. Hip mobility limited but FADIR and FABER negative without evidence of hip involvement with lumbar related pain. Education provided on HEP updates for neural tension in median nerves for UE and knees to chest for lumbar mobility due to significant lumbar stenosis. Pt  and significant other understanding of session in education. Updated HEP provided. No worsening of symptoms post treatment. Pt will continue to benefit from skilled PT services to decrease pain and overall improve functional mobility to optimize independence and reduce risk of falls.     OBJECTIVE IMPAIRMENTS Abnormal gait, cardiopulmonary status limiting activity, decreased activity tolerance, decreased balance, decreased coordination, decreased endurance, decreased mobility, difficulty walking, decreased ROM, decreased strength, increased fascial restrictions, impaired perceived functional ability, increased muscle spasms, impaired flexibility, impaired sensation, impaired UE functional use, improper body mechanics, obesity, and pain.  ACTIVITY LIMITATIONS carrying, lifting, bending, standing, squatting, sleeping, stairs, transfers, bed mobility, continence, dressing, reach over head, hygiene/grooming, and locomotion level  PARTICIPATION LIMITATIONS: meal prep, cleaning, laundry, shopping, community activity, occupation, yard work, and lifting/exercise  PERSONAL FACTORS Time since onset of injury/illness/exacerbation and 3+ comorbidities: T2DM, HTN, Seizures  are also affecting patient's functional outcome.   REHAB POTENTIAL: Fair secondary to duration of symptoms and complicated MSK diagnosis hx  CLINICAL DECISION MAKING: Stable/uncomplicated  EVALUATION COMPLEXITY: Moderate   GOALS: Goals reviewed with patient? Yes  SHORT TERM GOALS: Target date: 12/18/2021  Pt will be Ind and compliant with HEP, to improve overall self-management of symptoms outside of therapy. Baseline: HEP provided Goal status: INITIAL   LONG TERM GOALS: Target date: 01/15/2022  Pt will report 2-3/10 pain at it's worst, to improve overall tolerance for upright functional mobility, and to improve overall quality of life. Baseline: 6-7/10 Goal status: INITIAL  Pt will improve FOTO to target score to 44%, to  demonstrate clinically significant improvement in perceived functional mobility. Baseline:  Goal status: INITIAL  Pt will be asymptomatic with ULTT indicating improved muscle/nerve tension, for improved tolerance and ability to perform ADL's and IADL's. Baseline: median (+) with elbow extension Goal status: INITIAL  4.  Pt will increase shoulder AROM by 30deg grossly, to improve tolerance and independence with ADL's, and IADL's. Baseline: shoulder flexion 110deg Goal status: INITIAL  PLAN: PT FREQUENCY: 2x/week  PT DURATION: 8 weeks  PLANNED INTERVENTIONS: Therapeutic exercises, Therapeutic activity, Neuromuscular re-education, Balance training, Gait training, Patient/Family education, Self Care, Joint mobilization, Stair training, DME instructions, Aquatic Therapy, Dry Needling, Electrical stimulation, Spinal mobilization, Cryotherapy, Moist heat, scar mobilization, Traction, Ultrasound, Manual therapy, and Re-evaluation.  PLAN FOR NEXT SESSION: reassess median nerve glides, STM to cervical spine, address cervical radiculopathy    [place] Caster. Fairly IV, PT, DPT Physical Therapist- [facility]  3:31 PM,11/20/21

## 2021-11-21 ENCOUNTER — Encounter: Payer: Medicare Other | Attending: Family Medicine | Admitting: *Deleted

## 2021-11-21 ENCOUNTER — Encounter: Payer: Self-pay | Admitting: *Deleted

## 2021-11-21 ENCOUNTER — Ambulatory Visit: Payer: Medicare Other | Admitting: *Deleted

## 2021-11-21 VITALS — BP 118/84 | Ht 74.5 in | Wt 316.1 lb

## 2021-11-21 DIAGNOSIS — E119 Type 2 diabetes mellitus without complications: Secondary | ICD-10-CM | POA: Insufficient documentation

## 2021-11-21 DIAGNOSIS — Z6841 Body Mass Index (BMI) 40.0 and over, adult: Secondary | ICD-10-CM | POA: Diagnosis not present

## 2021-11-21 DIAGNOSIS — Z713 Dietary counseling and surveillance: Secondary | ICD-10-CM | POA: Insufficient documentation

## 2021-11-21 DIAGNOSIS — E11649 Type 2 diabetes mellitus with hypoglycemia without coma: Secondary | ICD-10-CM

## 2021-11-21 NOTE — Progress Notes (Signed)
Diabetes Self-Management Education  Visit Type: First/Initial  Appt. Start Time: 1505 Appt. End Time: 1625  11/21/2021  [name], identified by name and date of birth, is a 51 y.o. male with a diagnosis of Diabetes: Type 2.   ASSESSMENT  Blood pressure 118/84, height 6' 2.5" (1.892 m), weight (!) 316 lb 1.6 oz (143.4 kg). Body mass index is 40.04 kg/m.   Diabetes Self-Management Education - 11/21/21 1640       Visit Information   Visit Type First/Initial      Initial Visit   Diabetes Type Type 2    Date Diagnosed 1-2 years ago    Are you currently following a meal plan? No    Are you taking your medications as prescribed? Yes      Health Coping   How would you rate your overall health? Fair;Poor      Psychosocial Assessment   Patient Belief/Attitude about Diabetes Defeat/Burnout   "frustrating - depressed and fearful of what and when to eat"   What is the hardest part about your diabetes right now, causing you the most concern, or is the most worrisome to you about your diabetes?   Making healty food and beverage choices    Self-care barriers Debilitated state due to current medical condition   spinal injury   Self-management support Doctor's office;Friends;Family    Other persons present Spouse/SO    Patient Concerns Nutrition/Meal planning;Weight Control;Glycemic Control;Monitoring    Special Needs None    Preferred Learning Style Hands on    Learning Readiness Ready    How often do you need to have someone help you when you read instructions, pamphlets, or other written materials from your doctor or pharmacy? 1 - Never    What is the last grade level you completed in school? 16      Pre-Education Assessment   Patient understands the diabetes disease and treatment process. Needs Instruction    Patient understands incorporating nutritional management into lifestyle. Needs Instruction    Patient undertands incorporating physical activity into lifestyle.  Comprehends key points    Patient understands using medications safely. Needs Instruction    Patient understands monitoring blood glucose, interpreting and using results Needs Review    Patient understands prevention, detection, and treatment of acute complications. Needs Instruction    Patient understands prevention, detection, and treatment of chronic complications. Needs Review    Patient understands how to develop strategies to address psychosocial issues. Needs Instruction    Patient understands how to develop strategies to promote health/change behavior. Needs Instruction      Complications   Last HgB A1C per patient/outside source 7.3 %   06/16/2021   How often do you check your blood sugar? 1-2 times/day    Fasting Blood glucose range (mg/dL) 70-129   He reports FBG's of 70-100 mg/dL   Number of hypoglycemic episodes per month 3   usually in thte afternoon after he has skipped lunch and taken Glipizide   Can you tell when your blood sugar is low? Yes   reports he gets shaky, sweaty and weak   What do you do if your blood sugar is low? eats peanut butter and banana or cereal bar    Have you had a dilated eye exam in the past 12 months? Yes    Have you had a dental exam in the past 12 months? Yes    Are you checking your feet? No      Dietary Intake  Breakfast oomelet with biscuits and gravy, pancake and syrup; 4-5 eggs  and sausage; ham, potatoes, apples, biscuits and gravy; eggs, bacon, biscuits and gravy    Lunch skips or forgets due to medications or eats banana and peanut butter or graham crackers and peanut butter or applesauce or McDonalds    Snack (afternoon) 5 pm - peanut butter crackers, granola bar    Dinner chicken, pork, beef; potatoes, peas, beans, corn, rice, pasta, broccoli, green beans, zucchini, squash; [place], pizza    Snack (evening) 2 am with medicine - popcorn, cereal bar    Beverage(s) water, soda, Frappes, diet soda      Activity / Exercise   Activity /  Exercise Type ADL's      Patient Education   Previous Diabetes Education No    Disease Pathophysiology Definition of diabetes, type 1 and 2, and the diagnosis of diabetes;Factors that contribute to the development of diabetes;Explored patient's options for treatment of their diabetes    Healthy Eating Role of diet in the treatment of diabetes and the relationship between the three main macronutrients and blood glucose level;Food label reading, portion sizes and measuring food.;Reviewed blood glucose goals for pre and post meals and how to evaluate the patients' food intake on their blood glucose level.;Meal timing in regards to the patients' current diabetes medication.;Information on hints to eating out and maintain blood glucose control.    Being Active Role of exercise on diabetes management, blood pressure control and cardiac health.    Medications Reviewed patients medication for diabetes, action, purpose, timing of dose and side effects.    Monitoring Purpose and frequency of SMBG.;Taught/discussed recording of test results and interpretation of SMBG.;Identified appropriate SMBG and/or A1C goals.    Acute complications Taught prevention, symptoms, and  treatment of hypoglycemia - the 15 rule.    Chronic complications Relationship between chronic complications and blood glucose control    Diabetes Stress and Support Identified and addressed patients feelings and concerns about diabetes;Role of stress on diabetes      Individualized Goals (developed by patient)   Reducing Risk Other (comment)   improve blood sugars, prevent diabetes complications, lose weight     Outcomes   Expected Outcomes Demonstrated interest in learning but significant barriers to change    Future DMSE 4-6 wks         Individualized Plan for Diabetes Self-Management Training:   Learning Objective:  Patient will have a greater understanding of diabetes self-management. Patient education plan is to attend individual  and/or group sessions per assessed needs and concerns.   Plan:   Patient Instructions  Check blood sugars 1 x day before breakfast or 2 hrs after one meal every day Bring blood sugar records to the next class  Exercise:  Continue exercises per Physical Therapy and walk as tolerated  Eat 3 meals day,   1-2  snacks a day Space meals 4-6 hours apart Don't skip meals - eat at least 1 protein and 1 carbohydrate serving Limit fried foods and desserts/sweets Avoid sugar sweetened drinks (soda, coffee) unless treating a low blood sugar  Carry fast acting glucose and a snack at all times  Return for classes on:     Expected Outcomes:  Demonstrated interest in learning but significant barriers to change  Education material provided:  General Meal Planning Guidelines Simple Meal Plan Glucose tablets Symptoms, causes and treatments of Hypoglycemia Carb-Mindful Smoothie Handout  If problems or questions, patient to contact team via:  Thomas Drilling, RN, CCM,  CDCES [phone]  Future DSME appointment: 4-6 wks December 25, 2021 for Diabetes Class 1

## 2021-11-21 NOTE — Patient Instructions (Signed)
Check blood sugars 1 x day before breakfast or 2 hrs after one meal every day Bring blood sugar records to the next class  Exercise:  Continue exercises per Physical Therapy and walk as tolerated  Eat 3 meals day,   1-2  snacks a day Space meals 4-6 hours apart Don't skip meals - eat at least 1 protein and 1 carbohydrate serving Limit fried foods and desserts/sweets Avoid sugar sweetened drinks (soda, coffee) unless treating a low blood sugar  Carry fast acting glucose and a snack at all times  Return for classes on:

## 2021-11-22 ENCOUNTER — Other Ambulatory Visit: Payer: Self-pay | Admitting: *Deleted

## 2021-11-22 ENCOUNTER — Telehealth: Payer: Self-pay | Admitting: Student in an Organized Health Care Education/Training Program

## 2021-11-22 ENCOUNTER — Ambulatory Visit: Payer: Medicare Other

## 2021-11-22 DIAGNOSIS — G894 Chronic pain syndrome: Secondary | ICD-10-CM

## 2021-11-22 DIAGNOSIS — M792 Neuralgia and neuritis, unspecified: Secondary | ICD-10-CM

## 2021-11-22 DIAGNOSIS — M5382 Other specified dorsopathies, cervical region: Secondary | ICD-10-CM

## 2021-11-22 DIAGNOSIS — G959 Disease of spinal cord, unspecified: Secondary | ICD-10-CM

## 2021-11-22 DIAGNOSIS — M5414 Radiculopathy, thoracic region: Secondary | ICD-10-CM

## 2021-11-22 DIAGNOSIS — M25611 Stiffness of right shoulder, not elsewhere classified: Secondary | ICD-10-CM

## 2021-11-22 DIAGNOSIS — Z7409 Other reduced mobility: Secondary | ICD-10-CM | POA: Diagnosis not present

## 2021-11-22 NOTE — Telephone Encounter (Signed)
Caryl Pina at pharmacy is trying to get patients' bubble pack ready to send out so he can get it on Friday. The script they have is dated the 25th. Pharmacy is asking if a new script can be sent in for todays date so they can get it ready. The pateint will not get his meds early, just on time. Please call Caryl Pina at [facility] as soon as you can

## 2021-11-22 NOTE — Therapy (Signed)
OUTPATIENT PHYSICAL THERAPY THORACOLUMBAR TREATMENT   Patient Name: Thomas Cole MRN: [id number] DOB:Jul 08, 1970, 51 y.o., male Today's Date: 11/22/2021   PT End of Session - 11/22/21 1401     Visit Number 3    Number of Visits 16    Date for PT Re-Evaluation 12/25/21    Authorization Type BCBS Medicare    PT Start Time 1350    PT Stop Time 1430    PT Time Calculation (min) 40 min    Activity Tolerance Patient tolerated treatment well    Behavior During Therapy [facility] for tasks assessed/performed             Past Medical History:  Diagnosis Date   Anxiety    Asthma    Back problem    Benign essential hypertension 11/30/2016   Cervical spondylosis without myelopathy 3/38/2505   Complication of anesthesia    occ takes longer to wake   Depression    Diabetes mellitus without complication ([place]) 39/7673   Type 2   GERD (gastroesophageal reflux disease)    Hypertension    Labile hypertension 03/19/2013   No current neuro changes, headache much improved. No clear sign of intracranial bleed. NO indication for head CT>  BP much better now... Fluctuates a lot.  Will eval for pheochromocytoma. As recommended at last cardiology OV.. Will prescribe hydralazine to use as needed for BP fluctuations.  Close follow up with PCP.    Major depressive disorder, recurrent episode, severe ([place]) 06/23/2015   Migraine headache    Migraines 11/11/2013   S/P appendectomy 04/09/2011   Seizure ([place]) 11/30/2016   Seizures ([place])    last 87   Shortness of breath    SOB (shortness of breath) 08/06/2014   - [facility] eval with cpst 02/20/2008 exercise testing demonstrateed normal functional capabilities and a normal cardiopulmonary response to exercise. - 09/16/2014  Walked RA x 3 laps @ 185 ft each stopped due to end of study/ nl pace/ no desat  - spirometry 09/16/2014 > no obst/ min restriction      Past Surgical History:  Procedure Laterality Date   ANTERIOR CERVICAL CORPECTOMY N/A 09/15/2012   Procedure:  Cervical Three-Four,Cervical Six-Seven Anterior cervical decompression/diskectomy fusion with Cervical Four Corpectomy;  Surgeon: Kristeen Miss, MD;  Location: [facility];  Service: Neurosurgery;  Laterality: N/A;  Cervical Three-Four,Cervical Six-Seven  Anterior cervical decompression/diskectomy fusion with Cervical four Corpectomy   APPENDECTOMY  12   CERVICAL DISC SURGERY     CHOLECYSTECTOMY N/A 03/29/2017   Procedure: LAPAROSCOPIC CHOLECYSTECTOMY;  Surgeon: Florene Glen, MD;  Location: [facility];  Service: General;  Laterality: N/A;   COLONOSCOPY WITH PROPOFOL N/A 10/23/2019   Procedure: COLONOSCOPY WITH PROPOFOL;  Surgeon: Robert Bellow, MD;  Location: [facility];  Service: Endoscopy;  Laterality: N/A;   ESOPHAGOGASTRODUODENOSCOPY (EGD) WITH PROPOFOL N/A 10/23/2019   Procedure: ESOPHAGOGASTRODUODENOSCOPY (EGD) WITH PROPOFOL;  Surgeon: Robert Bellow, MD;  Location: [facility] ENDOSCOPY;  Service: Endoscopy;  Laterality: N/A;   KNEE SURGERY     bilateral   POSTERIOR FUSION CERVICAL SPINE     Patient Active Problem List   Diagnosis Date Noted   Spinal stenosis, lumbar region, with neurogenic claudication 07/04/2021   Chronic radicular lumbar pain 07/04/2021   Neuroforaminal stenosis of lumbar spine (L3,4,5) 07/04/2021   Thoracic facet joint syndrome 09/24/2019   Chronic pain syndrome 07/14/2019   Failed cervical fusion 11/20/2018   Episode of recurrent major depressive disorder ([place]) 11/20/2018   Myelopathy of cervical spinal cord  with cervical radiculopathy ([place]) 05/13/2018   Bilateral occipital neuralgia 02/11/2018   Degeneration of cervical intervertebral disc with myelopathy 06/13/2017   H/O cervical spinal arthrodesis 05/16/2017   Thoracic radiculopathy 05/16/2017   Acute cholecystitis 03/29/2017   Anxiety 11/30/2016   Benign essential hypertension 11/30/2016   Seizure ([place]) 11/30/2016   Localization-related symptomatic epilepsy and epileptic syndromes with complex  partial seizures, not intractable, without status epilepticus ([place]) 02/21/2016   Generalized anxiety disorder 06/23/2015   Major depressive disorder, recurrent episode, severe ([place]) 06/23/2015   PTSD (post-traumatic stress disorder) 06/16/2015   SOB (shortness of breath) 08/06/2014   Obesity (BMI 30-39.9) 11/11/2013   Migraines 11/11/2013   Asthma, mild intermittent 11/11/2013   History of seizures 11/11/2013   GERD without esophagitis 11/11/2013   Depression 11/11/2013   Labile hypertension 03/19/2013   Spondylosis, cervical, with myelopathy 09/17/2012   S/P appendectomy 04/09/2011   Sleep apnea 05/25/2010    PCP: Derinda Late, MD  REFERRING PROVIDER: Vladimir Crofts, MD  REFERRING DIAG: Parkinson's Disease  Rationale for Evaluation and Treatment Rehabilitation  THERAPY DIAG:  Impaired range of motion of both shoulders  Impaired functional mobility, balance, gait, and endurance  Neuralgia and neuritis  Impaired range of motion of cervical spine  ONSET DATE: 2013-2014  SUBJECTIVE:                                                                                                                                                                                           SUBJECTIVE STATEMENT: Pt doing well in gneeral, no updated. His legs have recovered poorly from prior session to his surprise. His pain was elevated last night but better today, about 5-6/10 central cervicla spine pain posterior and less er pain on bilateral cervical extensors on both side.   PERTINENT HISTORY:  Pt with extensive medical history concerning cervical, thoracic, and lumbar spine diagnoses including: thoracic radiculopathy, myelopathy of cervical spine cord, chronic pain syndrome, thoracic facet joint syndrome, hx lumbar laminectomy (1994), hx cervical fusion x2 (C3-4 ~19yr ago, C3-7 ~7-818yrago). Per pt, his MD has concern for myelomalacia of lower spinal cord. Pt with recent hospital admission in  January of this year due to acute/chronic low back pain. Pt notes episode of bowel/bladder incontinence, but denies saddle anesthesia.  Pt arrives to OPPT with c/o cervical and low back pain. Pt notes that this has been an ongoing problem for years, and it has been affecting his Independence at home, as well as his quality of life. Per pt, the MD said "he's one bad fall away from being done" as he states his MD has concern for myelomalacia. Pt  notes that symptoms are worse on the L arm and L leg. Pt enjoys being outside and working on cars. His significant other states he does daily walks down the driveway.  PAIN:  Are you having pain? Yes: NPRS scale: 6-7/10 Pain location: neck, back, lats   PRECAUTIONS: Fall  WEIGHT BEARING RESTRICTIONS No  FALLS:  Has patient fallen in last 6 months? Yes. Number of falls 1;  sitting on rollator without breaks locked; lost balance and fell off rollator  LIVING ENVIRONMENT: Lives with: lives alone Lives in: House/apartment Stairs: Yes: External: 3-4 steps; on left going up Has following equipment at home: Single point cane, Environmental consultant - 4 wheeled, Wheelchair (power), Wheelchair (manual), shower chair, and Grab bars. Lift chair recliner.  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 Severe spinal canal stenosis at the L2-L5 levels due to combination of disc bulges and congenitally narrow spinal canal. 2. Moderate right and severe left neural foraminal stenosis at L4-5 and moderate left neural foraminal stenosis at L5-S1. 3. No thoracic spinal canal or neural foraminal stenosis.  (04/28/21) MRI THORACIC AND LUMBAR SPINE WITHOUT CONTRAST IMPRESSION: 1. Severe spinal canal stenosis at the L2-L5 levels due  to combination of disc bulges and congenitally narrow spinal canal. 2. Moderate right and severe left neural foraminal stenosis at L4-5 and moderate left neural foraminal stenosis at L5-S1. 3. No thoracic spinal canal or neural foraminal stenosis.  PATIENT SURVEYS:  FOTO 35%  COGNITION:  Overall cognitive status: Within functional limits for tasks assessed     SENSATION: Light touch: diminished sensation LLE compared to RLE Touch discrimination: intact bil   COORDINATION:   Opposition: intact (R); bradykinetic (L) Finger to Nose: bradykinetic and undershooting (R); bradykinetic and undershooting (L)   Alternating Toe Tap: intact   Heel to Shin: intact bil, limited hip flexion ROM  CERVICAL ROM: (hx multiple cervical fusions)  Active  AROM  eval  Flexion 20deg  Extension 4deg  Right lateral flexion 10deg  Left lateral flexion 15deg  Right rotation 20deg  Left rotation 15deg, p!   (Blank rows = not tested)  UPPER EXTREMITY ROM:    Active  Right eval Left eval  Shoulder flexion 110 110    LOWER EXTREMITY ROM:    Limited seated hip flexion AROM (not associated with strength) HS length equal/WFL bil, no c/o neural tension Functionally observed limited hip ext bil  LOWER EXTREMITY MMT:    MMT Right eval Left eval  Hip flexion 4 4+  Knee flexion 4- 4  Knee extension 4 4+   (Blank rows = not tested)   FUNCTIONAL TESTS:  11/20/21:    5xSTS: 44.44 sec without UE support  10 m gait speed self-selected pace:    With SPC: 0.27 m/s   Without AD: .28 m/s  GAIT: Distance walked: 71f x2 Assistive device utilized: Single point cane Level of assistance: SBA Comments: limited hip ext bil, decreased R arm swing, SPC in L hand, decreased foot clearance bil, decreased step length, guarded movement    TODAY'S TREATMENT 11/22/21 -Rt rotation with restrictied left C0/C1 restriction Left Suboccipital release education on rotation + low chin Right  retraction + chin  tuck for C0/C1  Cervicalmanual traction 2x30sec Cervical traction on suboccipital towel roll Chin tucks 10x3secH  Seated Rt cervical rotation c low chin to end range (low resistance) Rt rotation 1x15      Access Code: [email or web address] URL: [email or web address] Date: 11/13/2021 Prepared by: Amalia Hailey   Exercises - Seated Gentle Upper Trapezius Stretch  - 1 x daily - 7 x weekly - 2 sets - 8 reps - 20 hold - Seated Levator Scapulae Stretch  - 1 x daily - 7 x weekly - 2 sets - 8 reps - 20 hold - Seated Scalenes Stretch  - 1 x daily - 7 x weekly - 2 sets - 8 reps - 20 hold     - Doorway Pec Stretch at 60 Elevation  - 1 x daily - 7 x weekly - 2 sets - 8 reps - 20 hold  Seated LUE median nerve glides. Max TC's for sequencing cervical motion with LUE motion  BKTC with sheet for leverage in supine: x10  Updated HEP provided for median nerve glides and BKTC   PATIENT EDUCATION:  Education details: HEP updates, functional status with 5xSTS and gait speed Person educated: Patient and girlfriend Education method: Explanation, Demonstration, Tactile cues, Verbal cues, and Handouts Education comprehension: verbalized understanding, verbal cues required, tactile cues required, and needs further education   HOME EXERCISE PROGRAM: Updated 11/20/21 Access Code: [email or web address] URL: [email or web address] Date: 11/20/2021 Prepared by: Larna Daughters  Exercises - Seated Gentle Upper Trapezius Stretch  - 1 x daily - 7 x weekly - 2 sets - 8 reps - 20 hold - Seated Levator Scapulae Stretch  - 1 x daily - 7 x weekly - 2 sets - 8 reps - 20 hold - Seated Scalenes Stretch  - 1 x daily - 7 x weekly - 2 sets - 8 reps - 20 hold - Doorway Pec Stretch at 60 Elevation  - 1 x daily - 7 x weekly - 2 sets - 8 reps - 20 hold - Median Nerve Glide  - 1 x daily - 7 x weekly - 2 sets - 10 reps - Supine Double Knee to Chest  - 1 x daily - 7 x weekly - 2 sets - 12 reps    Access Code:  [email or web address] URL: [email or web address] Date: 11/13/2021 Prepared by: Amalia Hailey  Exercises - Seated Gentle Upper Trapezius Stretch  - 1 x daily - 7 x weekly - 2 sets - 8 reps - 20 hold - Seated Levator Scapulae Stretch  - 1 x daily - 7 x weekly - 2 sets - 8 reps - 20 hold - Seated Scalenes Stretch  - 1 x daily - 7 x weekly - 2 sets - 8 reps - 20 hold - Doorway Pec Stretch at 60 Elevation  - 1 x daily - 7 x weekly - 2 sets - 8 reps - 20 hold  ASSESSMENT:  CLINICAL IMPRESSION: Focus on C0-C2 joint restrictions today with manual techniques, motor retraining, education, and home modiifcation. Pt does well, communicates his symptoms well. No pain exacerbation. Will trial traction with roll at home and report back next session.  Pt will continue to benefit from skilled PT services to decrease pain and overall improve functional mobility to optimize independence and reduce risk of falls.     OBJECTIVE IMPAIRMENTS Abnormal gait, cardiopulmonary status limiting activity, decreased activity tolerance, decreased balance, decreased coordination, decreased endurance, decreased mobility, difficulty walking, decreased ROM, decreased strength, increased fascial restrictions, impaired perceived functional ability, increased muscle spasms, impaired flexibility, impaired sensation, impaired UE functional use, improper body mechanics, obesity, and pain.  ACTIVITY LIMITATIONS  carrying, lifting, bending, standing, squatting, sleeping, stairs, transfers, bed mobility, continence, dressing, reach over head, hygiene/grooming, and locomotion level  PARTICIPATION LIMITATIONS: meal prep, cleaning, laundry, shopping, community activity, occupation, yard work, and lifting/exercise  PERSONAL FACTORS Time since onset of injury/illness/exacerbation and 3+ comorbidities: T2DM, HTN, Seizures  are also affecting patient's functional outcome.   REHAB POTENTIAL: Fair secondary to duration of symptoms and  complicated MSK diagnosis hx  CLINICAL DECISION MAKING: Stable/uncomplicated  EVALUATION COMPLEXITY: Moderate   GOALS: Goals reviewed with patient? Yes  SHORT TERM GOALS: Target date: 12/20/2021  Pt will be Ind and compliant with HEP, to improve overall self-management of symptoms outside of therapy. Baseline: HEP provided Goal status: INITIAL   LONG TERM GOALS: Target date: 01/17/2022  Pt will report 2-3/10 pain at it's worst, to improve overall tolerance for upright functional mobility, and to improve overall quality of life. Baseline: 6-7/10 Goal status: INITIAL  Pt will improve FOTO to target score to 44%, to demonstrate clinically significant improvement in perceived functional mobility. Baseline:  Goal status: INITIAL  Pt will be asymptomatic with ULTT indicating improved muscle/nerve tension, for improved tolerance and ability to perform ADL's and IADL's. Baseline: median (+) with elbow extension Goal status: INITIAL  4.  Pt will increase shoulder AROM by 30deg grossly, to improve tolerance and independence with ADL's, and IADL's. Baseline: shoulder flexion 110deg Goal status: INITIAL  PLAN: PT FREQUENCY: 2x/week  PT DURATION: 8 weeks  PLANNED INTERVENTIONS: Therapeutic exercises, Therapeutic activity, Neuromuscular re-education, Balance training, Gait training, Patient/Family education, Self Care, Joint mobilization, Stair training, DME instructions, Aquatic Therapy, Dry Needling, Electrical stimulation, Spinal mobilization, Cryotherapy, Moist heat, scar mobilization, Traction, Ultrasound, Manual therapy, and Re-evaluation.  PLAN FOR NEXT SESSION: reassess median nerve glides, STM to cervical spine, address cervical radiculopathy     2:36 PM, 11/22/21 Etta Grandchild, PT, DPT Physical Therapist - [place] [phone]     2:05 PM,11/22/21

## 2021-11-22 NOTE — Telephone Encounter (Signed)
Message sent to Dr Lateef 

## 2021-11-23 ENCOUNTER — Other Ambulatory Visit: Payer: Self-pay | Admitting: Student in an Organized Health Care Education/Training Program

## 2021-11-23 DIAGNOSIS — G894 Chronic pain syndrome: Secondary | ICD-10-CM

## 2021-11-23 DIAGNOSIS — G959 Disease of spinal cord, unspecified: Secondary | ICD-10-CM

## 2021-11-23 DIAGNOSIS — M5414 Radiculopathy, thoracic region: Secondary | ICD-10-CM

## 2021-11-23 MED ORDER — HYDROCODONE BITARTRATE ER 10 MG PO CP12: 10.0000 mg | ORAL_CAPSULE | Freq: Two times a day (BID) | ORAL | 0 refills | Status: AC

## 2021-11-23 MED ORDER — HYDROCODONE BITARTRATE ER 10 MG PO CP12: 10.0000 mg | ORAL_CAPSULE | Freq: Two times a day (BID) | ORAL | 0 refills | Status: DC

## 2021-11-23 NOTE — Telephone Encounter (Signed)
[facility] to cancel inadvertant Rx that was sent in for 12/24/21.  They were needing a new Rx sent for August for the bitartrate.  There are only 2 fillable  Rx's left at pharmacy for hydro - apap 10-325 mg and bitartrate ER for September fills.

## 2021-11-23 NOTE — Progress Notes (Signed)
Spoke with [facility] and cancelled 12/24/21

## 2021-11-27 ENCOUNTER — Telehealth: Payer: Self-pay | Admitting: Physical Therapy

## 2021-11-27 ENCOUNTER — Ambulatory Visit: Payer: Medicare Other | Admitting: Physical Therapy

## 2021-11-27 NOTE — Therapy (Deleted)
OUTPATIENT PHYSICAL THERAPY THORACOLUMBAR TREATMENT   Patient Name: Thomas Cole MRN: [id number] DOB:[birth year], 51 y.o., male Today's Date: 11/27/2021     Past Medical History:  Diagnosis Date   Anxiety    Asthma    Back problem    Benign essential hypertension 11/30/2016   Cervical spondylosis without myelopathy 3/81/0175   Complication of anesthesia    occ takes longer to wake   Depression    Diabetes mellitus without complication ([place]) 01/2584   Type 2   GERD (gastroesophageal reflux disease)    Hypertension    Labile hypertension 03/19/2013   No current neuro changes, headache much improved. No clear sign of intracranial bleed. NO indication for head CT>  BP much better now... Fluctuates a lot.  Will eval for pheochromocytoma. As recommended at last cardiology OV.. Will prescribe hydralazine to use as needed for BP fluctuations.  Close follow up with PCP.    Major depressive disorder, recurrent episode, severe ([place]) 06/23/2015   Migraine headache    Migraines 11/11/2013   S/P appendectomy 04/09/2011   Seizure ([place]) 11/30/2016   Seizures ([place])    last 87   Shortness of breath    SOB (shortness of breath) 08/06/2014   - [facility] eval with cpst 02/20/2008 exercise testing demonstrateed normal functional capabilities and a normal cardiopulmonary response to exercise. - 09/16/2014  Walked RA x 3 laps @ 185 ft each stopped due to end of study/ nl pace/ no desat  - spirometry 09/16/2014 > no obst/ min restriction      Past Surgical History:  Procedure Laterality Date   ANTERIOR CERVICAL CORPECTOMY N/A 09/15/2012   Procedure: Cervical Three-Four,Cervical Six-Seven Anterior cervical decompression/diskectomy fusion with Cervical Four Corpectomy;  Surgeon: Kristeen Miss, MD;  Location: [facility];  Service: Neurosurgery;  Laterality: N/A;  Cervical Three-Four,Cervical Six-Seven  Anterior cervical decompression/diskectomy fusion with Cervical four Corpectomy   APPENDECTOMY  12   CERVICAL DISC  SURGERY     CHOLECYSTECTOMY N/A 03/29/2017   Procedure: LAPAROSCOPIC CHOLECYSTECTOMY;  Surgeon: Florene Glen, MD;  Location: [facility];  Service: General;  Laterality: N/A;   COLONOSCOPY WITH PROPOFOL N/A 10/23/2019   Procedure: COLONOSCOPY WITH PROPOFOL;  Surgeon: Robert Bellow, MD;  Location: [facility];  Service: Endoscopy;  Laterality: N/A;   ESOPHAGOGASTRODUODENOSCOPY (EGD) WITH PROPOFOL N/A 10/23/2019   Procedure: ESOPHAGOGASTRODUODENOSCOPY (EGD) WITH PROPOFOL;  Surgeon: Robert Bellow, MD;  Location: [facility];  Service: Endoscopy;  Laterality: N/A;   KNEE SURGERY     bilateral   POSTERIOR FUSION CERVICAL SPINE     Patient Active Problem List   Diagnosis Date Noted   Spinal stenosis, lumbar region, with neurogenic claudication 07/04/2021   Chronic radicular lumbar pain 07/04/2021   Neuroforaminal stenosis of lumbar spine (L3,4,5) 07/04/2021   Thoracic facet joint syndrome 09/24/2019   Chronic pain syndrome 07/14/2019   Failed cervical fusion 11/20/2018   Episode of recurrent major depressive disorder ([place]) 11/20/2018   Myelopathy of cervical spinal cord with cervical radiculopathy ([place]) 05/13/2018   Bilateral occipital neuralgia 02/11/2018   Degeneration of cervical intervertebral disc with myelopathy 06/13/2017   H/O cervical spinal arthrodesis 05/16/2017   Thoracic radiculopathy 05/16/2017   Acute cholecystitis 03/29/2017   Anxiety 11/30/2016   Benign essential hypertension 11/30/2016   Seizure ([place]) 11/30/2016   Localization-related symptomatic epilepsy and epileptic syndromes with complex partial seizures, not intractable, without status epilepticus ([place]) 02/21/2016   Generalized anxiety disorder 06/23/2015   Major depressive disorder, recurrent episode, severe ([place]) 06/23/2015  PTSD (post-traumatic stress disorder) 06/16/2015   SOB (shortness of breath) 08/06/2014   Obesity (BMI 30-39.9) 11/11/2013   Migraines 11/11/2013   Asthma, mild intermittent  11/11/2013   History of seizures 11/11/2013   GERD without esophagitis 11/11/2013   Depression 11/11/2013   Labile hypertension 03/19/2013   Spondylosis, cervical, with myelopathy 09/17/2012   S/P appendectomy 04/09/2011   Sleep apnea 05/25/2010    PCP: Derinda Late, MD  REFERRING PROVIDER: Vladimir Crofts, MD  REFERRING DIAG: Parkinson's Disease  Rationale for Evaluation and Treatment Rehabilitation  THERAPY DIAG:  Impaired range of motion of both shoulders  Impaired functional mobility, balance, gait, and endurance  Neuralgia and neuritis  ONSET DATE: 2013-2014  SUBJECTIVE:                                                                                                                                                                                           SUBJECTIVE STATEMENT: Pt doing well in gneeral, no updated. His legs have recovered poorly from prior session to his surprise. His pain was elevated last night but better today, about 5-6/10 central cervicla spine pain posterior and less er pain on bilateral cervical extensors on both side.   PERTINENT HISTORY:  Pt with extensive medical history concerning cervical, thoracic, and lumbar spine diagnoses including: thoracic radiculopathy, myelopathy of cervical spine cord, chronic pain syndrome, thoracic facet joint syndrome, hx lumbar laminectomy (1994), hx cervical fusion x2 (C3-4 ~43yr ago, C3-7 ~7-842yrago). Per pt, his MD has concern for myelomalacia of lower spinal cord. Pt with recent hospital admission in January of this year due to acute/chronic low back pain. Pt notes episode of bowel/bladder incontinence, but denies saddle anesthesia.  Pt arrives to OPPT with c/o cervical and low back pain. Pt notes that this has been an ongoing problem for years, and it has been affecting his Independence at home, as well as his quality of life. Per pt, the MD said "he's one bad fall away from being done" as he states his MD has  concern for myelomalacia. Pt notes that symptoms are worse on the L arm and L leg. Pt enjoys being outside and working on cars. His significant other states he does daily walks down the driveway.  PAIN:  Are you having pain? Yes: NPRS scale: 6-7/10 Pain location: neck, back, lats   PRECAUTIONS: Fall  WEIGHT BEARING RESTRICTIONS No  FALLS:  Has patient fallen in last 6 months? Yes. Number of falls 1;  sitting on rollator without breaks locked; lost balance and fell off rollator  LIVING ENVIRONMENT: Lives with: lives alone Lives in: House/apartment Stairs: Yes: External: 3-4  steps; on left going up Has following equipment at home: Single point cane, [place] - 4 wheeled, Wheelchair (power), Wheelchair (manual), shower chair, and Grab bars. Lift chair recliner.  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 Severe spinal canal stenosis at the L2-L5 levels due to combination of disc bulges and congenitally narrow spinal canal. 2. Moderate right and severe left neural foraminal stenosis at L4-5 and moderate left neural foraminal stenosis at L5-S1. 3. No thoracic spinal canal or neural foraminal stenosis.  (04/28/21) MRI THORACIC AND LUMBAR SPINE WITHOUT CONTRAST IMPRESSION: 1. Severe spinal canal stenosis at the L2-L5 levels due to combination of disc bulges and congenitally narrow spinal canal. 2. Moderate right and severe left neural foraminal stenosis at L4-5 and moderate left neural foraminal stenosis at L5-S1. 3. No thoracic spinal canal or neural foraminal stenosis.  PATIENT SURVEYS:  FOTO 35%  COGNITION:  Overall cognitive status: Within functional limits for tasks assessed     SENSATION: Light touch: diminished sensation LLE  compared to RLE Touch discrimination: intact bil   COORDINATION:   Opposition: intact (R); bradykinetic (L) Finger to Nose: bradykinetic and undershooting (R); bradykinetic and undershooting (L)   Alternating Toe Tap: intact   Heel to Shin: intact bil, limited hip flexion ROM  CERVICAL ROM: (hx multiple cervical fusions)  Active  AROM  eval  Flexion 20deg  Extension 4deg  Right lateral flexion 10deg  Left lateral flexion 15deg  Right rotation 20deg  Left rotation 15deg, p!   (Blank rows = not tested)  UPPER EXTREMITY ROM:    Active  Right eval Left eval  Shoulder flexion 110 110    LOWER EXTREMITY ROM:    Limited seated hip flexion AROM (not associated with strength) HS length equal/WFL bil, no c/o neural tension Functionally observed limited hip ext bil  LOWER EXTREMITY MMT:    MMT Right eval Left eval  Hip flexion 4 4+  Knee flexion 4- 4  Knee extension 4 4+   (Blank rows = not tested)   FUNCTIONAL TESTS:  11/20/21:    5xSTS: 44.44 sec without UE support  10 m gait speed self-selected pace:    With SPC: 0.27 m/s   Without AD: .28 m/s  GAIT: Distance walked: 53f x2 Assistive device utilized: Single point cane Level of assistance: SBA Comments: limited hip ext bil, decreased R arm swing, SPC in L hand, decreased foot clearance bil, decreased step length, guarded movement    TODAY'S TREATMENT 11/27/21 Therapeutic Exercise:    retraction + chin tuck for C0/C1  Chin tucks 10x3secH  Seated Rt cervical rotation c low chin to end range (low resistance) Rt rotation 1x15    Manual Therapy:  Cervicalmanual traction 2x30sec Left Suboccipital release UT stretch with GH depression 2 x 30 sec ea side  End range rotation in pain free motion 2 x 30 sec ea side   Access Code: [email or web address] URL: [email or web address] Date: 11/13/2021 Prepared by: SAmalia Hailey  Exercises - Seated Gentle Upper Trapezius Stretch  - 1 x daily - 7 x weekly - 2 sets -  8 reps - 20 hold - Seated Levator Scapulae Stretch  - 1 x daily - 7 x weekly - 2 sets - 8 reps - 20 hold - Seated Scalenes Stretch  -  1 x daily - 7 x weekly - 2 sets - 8 reps - 20 hold     - Doorway Pec Stretch at 60 Elevation  - 1 x daily - 7 x weekly - 2 sets - 8 reps - 20 hold  Seated LUE median nerve glides. Max TC's for sequencing cervical motion with LUE motion  BKTC with sheet for leverage in supine: x10  Updated HEP provided for median nerve glides and BKTC   PATIENT EDUCATION:  Education details: HEP updates, functional status with 5xSTS and gait speed Person educated: Patient and girlfriend Education method: Explanation, Demonstration, Tactile cues, Verbal cues, and Handouts Education comprehension: verbalized understanding, verbal cues required, tactile cues required, and needs further education   HOME EXERCISE PROGRAM: Updated 11/20/21 Access Code: [email or web address] URL: [email or web address] Date: 11/20/2021 Prepared by: Larna Daughters  Exercises - Seated Gentle Upper Trapezius Stretch  - 1 x daily - 7 x weekly - 2 sets - 8 reps - 20 hold - Seated Levator Scapulae Stretch  - 1 x daily - 7 x weekly - 2 sets - 8 reps - 20 hold - Seated Scalenes Stretch  - 1 x daily - 7 x weekly - 2 sets - 8 reps - 20 hold - Doorway Pec Stretch at 60 Elevation  - 1 x daily - 7 x weekly - 2 sets - 8 reps - 20 hold - Median Nerve Glide  - 1 x daily - 7 x weekly - 2 sets - 10 reps - Supine Double Knee to Chest  - 1 x daily - 7 x weekly - 2 sets - 12 reps    Access Code: [email or web address] URL: [email or web address] Date: 11/13/2021 Prepared by: Amalia Hailey  Exercises - Seated Gentle Upper Trapezius Stretch  - 1 x daily - 7 x weekly - 2 sets - 8 reps - 20 hold - Seated Levator Scapulae Stretch  - 1 x daily - 7 x weekly - 2 sets - 8 reps - 20 hold - Seated Scalenes Stretch  - 1 x daily - 7 x weekly - 2 sets - 8 reps - 20 hold - Doorway Pec Stretch at 60 Elevation  - 1 x daily  - 7 x weekly - 2 sets - 8 reps - 20 hold  ASSESSMENT:  CLINICAL IMPRESSION: Focus on C0-C2 joint restrictions today with manual techniques, motor retraining, education, and home modiifcation. Pt does well, communicates his symptoms well. No pain exacerbation. Will trial traction with roll at home and report back next session.  Pt will continue to benefit from skilled PT services to decrease pain and overall improve functional mobility to optimize independence and reduce risk of falls.     OBJECTIVE IMPAIRMENTS Abnormal gait, cardiopulmonary status limiting activity, decreased activity tolerance, decreased balance, decreased coordination, decreased endurance, decreased mobility, difficulty walking, decreased ROM, decreased strength, increased fascial restrictions, impaired perceived functional ability, increased muscle spasms, impaired flexibility, impaired sensation, impaired UE functional use, improper body mechanics, obesity, and pain.  ACTIVITY LIMITATIONS carrying, lifting, bending, standing, squatting, sleeping, stairs, transfers, bed mobility, continence, dressing, reach over head, hygiene/grooming, and locomotion level  PARTICIPATION LIMITATIONS: meal prep, cleaning, laundry, shopping, community activity, occupation, yard work, and lifting/exercise  PERSONAL FACTORS Time since onset of injury/illness/exacerbation and 3+ comorbidities: T2DM, HTN, Seizures  are also affecting patient's functional outcome.   REHAB POTENTIAL: Fair secondary to duration of symptoms and complicated MSK diagnosis hx  CLINICAL DECISION MAKING: Stable/uncomplicated  EVALUATION COMPLEXITY: Moderate   GOALS: Goals reviewed with patient? Yes  SHORT TERM GOALS: Target date: 12/25/2021  Pt will be Ind and compliant with HEP, to improve overall self-management of symptoms outside of therapy. Baseline: HEP provided Goal status: INITIAL   LONG TERM GOALS: Target date: 01/22/2022  Pt will report 2-3/10 pain  at it's worst, to improve overall tolerance for upright functional mobility, and to improve overall quality of life. Baseline: 6-7/10 Goal status: INITIAL  Pt will improve FOTO to target score to 44%, to demonstrate clinically significant improvement in perceived functional mobility. Baseline:  Goal status: INITIAL  Pt will be asymptomatic with ULTT indicating improved muscle/nerve tension, for improved tolerance and ability to perform ADL's and IADL's. Baseline: median (+) with elbow extension Goal status: INITIAL  4.  Pt will increase shoulder AROM by 30deg grossly, to improve tolerance and independence with ADL's, and IADL's. Baseline: shoulder flexion 110deg Goal status: INITIAL  PLAN: PT FREQUENCY: 2x/week  PT DURATION: 8 weeks  PLANNED INTERVENTIONS: Therapeutic exercises, Therapeutic activity, Neuromuscular re-education, Balance training, Gait training, Patient/Family education, Self Care, Joint mobilization, Stair training, DME instructions, Aquatic Therapy, Dry Needling, Electrical stimulation, Spinal mobilization, Cryotherapy, Moist heat, scar mobilization, Traction, Ultrasound, Manual therapy, and Re-evaluation.  PLAN FOR NEXT SESSION: reassess median nerve glides, STM to cervical spine, address cervical radiculopathy     8:22 AM, 11/27/21 Particia Lather PT    8:22 AM,11/27/21

## 2021-11-27 NOTE — Telephone Encounter (Signed)
Pt contacted via telephone and author left voice mail informing of missed appointment and informed pt of future PT appointment date and time.   Joh Rao PT, DPT   

## 2021-11-29 ENCOUNTER — Ambulatory Visit: Payer: Medicare Other

## 2021-11-29 DIAGNOSIS — Z7409 Other reduced mobility: Secondary | ICD-10-CM | POA: Diagnosis not present

## 2021-11-29 DIAGNOSIS — M542 Cervicalgia: Secondary | ICD-10-CM

## 2021-11-29 DIAGNOSIS — M5382 Other specified dorsopathies, cervical region: Secondary | ICD-10-CM

## 2021-11-29 DIAGNOSIS — M25612 Stiffness of left shoulder, not elsewhere classified: Secondary | ICD-10-CM

## 2021-11-29 DIAGNOSIS — M792 Neuralgia and neuritis, unspecified: Secondary | ICD-10-CM

## 2021-11-29 NOTE — Therapy (Signed)
OUTPATIENT PHYSICAL THERAPY THORACOLUMBAR TREATMENT   Patient Name: Thomas Cole MRN: [id number] DOB:05/17/1970, 51 y.o., male Today's Date: 11/29/2021   PT End of Session - 11/29/21 1304     Visit Number 4    Number of Visits 16    Date for PT Re-Evaluation 12/25/21    Authorization Type BCBS Medicare    PT Start Time 6967    PT Stop Time 1228    PT Time Calculation (min) 43 min    Activity Tolerance Patient tolerated treatment well    Behavior During Therapy [facility] for tasks assessed/performed             Past Medical History:  Diagnosis Date   Anxiety    Asthma    Back problem    Benign essential hypertension 11/30/2016   Cervical spondylosis without myelopathy 8/93/8101   Complication of anesthesia    occ takes longer to wake   Depression    Diabetes mellitus without complication ([place]) 75/1025   Type 2   GERD (gastroesophageal reflux disease)    Hypertension    Labile hypertension 03/19/2013   No current neuro changes, headache much improved. No clear sign of intracranial bleed. NO indication for head CT>  BP much better now... Fluctuates a lot.  Will eval for pheochromocytoma. As recommended at last cardiology OV.. Will prescribe hydralazine to use as needed for BP fluctuations.  Close follow up with PCP.    Major depressive disorder, recurrent episode, severe ([place]) 06/23/2015   Migraine headache    Migraines 11/11/2013   S/P appendectomy 04/09/2011   Seizure ([place]) 11/30/2016   Seizures ([place])    last 87   Shortness of breath    SOB (shortness of breath) 08/06/2014   - [facility] eval with cpst 02/20/2008 exercise testing demonstrateed normal functional capabilities and a normal cardiopulmonary response to exercise. - 09/16/2014  Walked RA x 3 laps @ 185 ft each stopped due to end of study/ nl pace/ no desat  - spirometry 09/16/2014 > no obst/ min restriction      Past Surgical History:  Procedure Laterality Date   ANTERIOR CERVICAL CORPECTOMY N/A 09/15/2012   Procedure:  Cervical Three-Four,Cervical Six-Seven Anterior cervical decompression/diskectomy fusion with Cervical Four Corpectomy;  Surgeon: Kristeen Miss, MD;  Location: [place] [facility];  Service: Neurosurgery;  Laterality: N/A;  Cervical Three-Four,Cervical Six-Seven  Anterior cervical decompression/diskectomy fusion with Cervical four Corpectomy   APPENDECTOMY  12   CERVICAL DISC SURGERY     CHOLECYSTECTOMY N/A 03/29/2017   Procedure: LAPAROSCOPIC CHOLECYSTECTOMY;  Surgeon: Florene Glen, MD;  Location: [facility];  Service: General;  Laterality: N/A;   COLONOSCOPY WITH PROPOFOL N/A 10/23/2019   Procedure: COLONOSCOPY WITH PROPOFOL;  Surgeon: Robert Bellow, MD;  Location: [facility];  Service: Endoscopy;  Laterality: N/A;   ESOPHAGOGASTRODUODENOSCOPY (EGD) WITH PROPOFOL N/A 10/23/2019   Procedure: ESOPHAGOGASTRODUODENOSCOPY (EGD) WITH PROPOFOL;  Surgeon: Robert Bellow, MD;  Location: [facility] END[facility]COPY;  Service: Endoscopy;  Laterality: N/A;   KNEE SURGERY     bilateral   POSTERIOR FUSION CERVICAL SPINE     Patient Active Problem List   Diagnosis Date Noted   Spinal stenosis, lumbar region, with neurogenic claudication 07/04/2021   Chronic radicular lumbar pain 07/04/2021   Neuroforaminal stenosis of lumbar spine (L3,4,5) 07/04/2021   Thoracic facet joint syndrome 09/24/2019   Chronic pain syndrome 07/14/2019   Failed cervical fusion 11/20/2018   Episode of recurrent major depressive disorder ([place]) 11/20/2018   Myelopathy of cervical spinal cord  with cervical radiculopathy ([place]) 05/13/2018   Bilateral occipital neuralgia 02/11/2018   Degeneration of cervical intervertebral disc with myelopathy 06/13/2017   H/O cervical spinal arthrodesis 05/16/2017   Thoracic radiculopathy 05/16/2017   Acute cholecystitis 03/29/2017   Anxiety 11/30/2016   Benign essential hypertension 11/30/2016   Seizure ([place]) 11/30/2016   Localization-related symptomatic epilepsy and epileptic syndromes with complex  partial seizures, not intractable, without status epilepticus ([place]) 02/21/2016   Generalized anxiety disorder 06/23/2015   Major depressive disorder, recurrent episode, severe ([place]) 06/23/2015   PTSD (post-traumatic stress disorder) 06/16/2015   SOB (shortness of breath) 08/06/2014   Obesity (BMI 30-39.9) 11/11/2013   Migraines 11/11/2013   Asthma, mild intermittent 11/11/2013   History of seizures 11/11/2013   GERD without esophagitis 11/11/2013   Depression 11/11/2013   Labile hypertension 03/19/2013   Spondylosis, cervical, with myelopathy 09/17/2012   S/P appendectomy 04/09/2011   Sleep apnea 05/25/2010    PCP: Derinda Late, MD  REFERRING PROVIDER: Vladimir Crofts, MD  REFERRING DIAG: Parkinson's Disease  Rationale for Evaluation and Treatment Rehabilitation  THERAPY DIAG:  Impaired range of motion of both shoulders  Cervicalgia  Impaired functional mobility, balance, gait, and endurance  Neuralgia and neuritis  Impaired range of motion of cervical spine  ONSET DATE: 2013-2014  SUBJECTIVE:                                                                                                                                                                                           SUBJECTIVE STATEMENT: Pt doing okay. Reports having some questions about the technique with his home program. Reports cervical pain bothering him more lately than lumbar.  PERTINENT HISTORY:  Pt with extensive medical history concerning cervical, thoracic, and lumbar spine diagnoses including: thoracic radiculopathy, myelopathy of cervical spine cord, chronic pain syndrome, thoracic facet joint syndrome, hx lumbar laminectomy (1994), hx cervical fusion x2 (C3-4 ~75yr ago, C3-7 ~7-882yrago). Per pt, his MD has concern for myelomalacia of lower spinal cord. Pt with recent hospital admission in January of this year due to acute/chronic low back pain. Pt notes episode of bowel/bladder incontinence, but  denies saddle anesthesia.  Pt arrives to OPPT with c/o cervical and low back pain. Pt notes that this has been an ongoing problem for years, and it has been affecting his Independence at home, as well as his quality of life. Per pt, the MD said "he's one bad fall away from being done" as he states his MD has concern for myelomalacia. Pt notes that symptoms are worse on the L arm and L leg. Pt enjoys being outside and working on cars. His  significant other states he does daily walks down the driveway.  PAIN:  Are you having pain? Yes: NPRS scale: 6-7/10 Pain location: neck, back, lats   PRECAUTIONS: Fall  WEIGHT BEARING RESTRICTIONS No  FALLS:  Has patient fallen in last 6 months? Yes. Number of falls 1;  sitting on rollator without breaks locked; lost balance and fell off rollator  LIVING ENVIRONMENT: Lives with: lives alone Lives in: House/apartment Stairs: Yes: External: 3-4 steps; on left going up Has following equipment at home: Single point cane, Environmental consultant - 4 wheeled, Wheelchair (power), Wheelchair (manual), shower chair, and Grab bars. Lift chair recliner.  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 Severe spinal canal stenosis at the L2-L5 levels due to combination of disc bulges and congenitally narrow spinal canal. 2. Moderate right and severe left neural foraminal stenosis at L4-5 and moderate left neural foraminal stenosis at L5-S1. 3. No thoracic spinal canal or neural foraminal stenosis.  (04/28/21) MRI THORACIC AND LUMBAR SPINE WITHOUT CONTRAST IMPRESSION: 1. Severe spinal canal stenosis at the L2-L5 levels due to combination of disc bulges and congenitally narrow spinal canal. 2. Moderate right and severe left neural  foraminal stenosis at L4-5 and moderate left neural foraminal stenosis at L5-S1. 3. No thoracic spinal canal or neural foraminal stenosis.  PATIENT SURVEYS:  FOTO 35%  COGNITION:  Overall cognitive status: Within functional limits for tasks assessed     SENSATION: Light touch: diminished sensation LLE compared to RLE Touch discrimination: intact bil   COORDINATION:   Opposition: intact (R); bradykinetic (L) Finger to Nose: bradykinetic and undershooting (R); bradykinetic and undershooting (L)   Alternating Toe Tap: intact   Heel to Shin: intact bil, limited hip flexion ROM  CERVICAL ROM: (hx multiple cervical fusions)  Active  AROM  eval  Flexion 20deg  Extension 4deg  Right lateral flexion 10deg  Left lateral flexion 15deg  Right rotation 20deg  Left rotation 15deg, p!   (Blank rows = not tested)  UPPER EXTREMITY ROM:    Active  Right eval Left eval  Shoulder flexion 110 110    LOWER EXTREMITY ROM:    Limited seated hip flexion AROM (not associated with strength) HS length equal/WFL bil, no c/o neural tension Functionally observed limited hip ext bil  LOWER EXTREMITY MMT:    MMT Right eval Left eval  Hip flexion 4 4+  Knee flexion 4- 4  Knee extension 4 4+   (Blank rows = not tested)   FUNCTIONAL TESTS:  11/20/21:    5xSTS: 44.44 sec without UE support  10 m gait speed self-selected pace:    With SPC: 0.27 m/s   Without AD: .28 m/s  GAIT: Distance walked: 62f x2 Assistive device utilized: Single point cane Level of assistance: SBA Comments: limited hip ext bil, decreased R arm swing, SPC in L hand, decreased foot clearance bil, decreased step length, guarded movement    TODAY'S TREATMENT 11/29/21  Manual therapy:   Cervical rotation- Hold 30 sec x 4 each side Supine Suboccipital release- Hold 60 sec x2 Cervical SB - hold 30 sec x 4  Cervicalmanual traction 2x30sec STM to B scalenes/levator/B UT in supine  Therapeutic Exercises:     Review of previous HEP-  -Scalene stretch - Hold 30 sec x  2 each side -B UT stretch- Hold 30 sec x 3 each side -B levator stretch- Hold 30 sec x 3 each side Median nerve glide x 12 reps -Verbally discussed Pectoral stretch in doorway- Patient reported no issues  Measured cervical mobility:   SB= Left - 16 deg; Right= 18 deg Rot= left= 22 deg; Right = 28 deg  Access Code: [email or web address] URL: [email or web address] Date: 11/13/2021 Prepared by: Amalia Hailey   Exercises - Seated Gentle Upper Trapezius Stretch  - 1 x daily - 7 x weekly - 2 sets - 8 reps - 20 hold - Seated Levator Scapulae Stretch  - 1 x daily - 7 x weekly - 2 sets - 8 reps - 20 hold - Seated Scalenes Stretch  - 1 x daily - 7 x weekly - 2 sets - 8 reps - 20 hold     - Doorway Pec Stretch at 60 Elevation  - 1 x daily - 7 x weekly - 2 sets - 8 reps - 20 hold  Seated LUE median nerve glides. Max TC's for sequencing cervical motion with LUE motion  BKTC with sheet for leverage in supine: x10  Updated HEP provided for median nerve glides and BKTC   PATIENT EDUCATION:  Education details: HEP updates, functional status with 5xSTS and gait speed Person educated: Patient and girlfriend Education method: Explanation, Demonstration, Tactile cues, Verbal cues, and Handouts Education comprehension: verbalized understanding, verbal cues required, tactile cues required, and needs further education   HOME EXERCISE PROGRAM: Updated 11/20/21 Access Code: [email or web address] URL: [email or web address] Date: 11/20/2021 Prepared by: Larna Daughters  Exercises - Seated Gentle Upper Trapezius Stretch  - 1 x daily - 7 x weekly - 2 sets - 8 reps - 20 hold - Seated Levator Scapulae Stretch  - 1 x daily - 7 x weekly - 2 sets - 8 reps - 20 hold - Seated Scalenes Stretch  - 1 x daily - 7 x weekly - 2 sets - 8 reps - 20 hold - Doorway Pec Stretch at 60 Elevation  - 1 x daily - 7 x weekly - 2 sets - 8 reps - 20 hold - Median  Nerve Glide  - 1 x daily - 7 x weekly - 2 sets - 10 reps - Supine Double Knee to Chest  - 1 x daily - 7 x weekly - 2 sets - 12 reps    Access Code: [email or web address] URL: [email or web address] Date: 11/13/2021 Prepared by: Amalia Hailey  Exercises - Seated Gentle Upper Trapezius Stretch  - 1 x daily - 7 x weekly - 2 sets - 8 reps - 20 hold - Seated Levator Scapulae Stretch  - 1 x daily - 7 x weekly - 2 sets - 8 reps - 20 hold - Seated Scalenes Stretch  - 1 x daily - 7 x weekly - 2 sets - 8 reps - 20 hold - Doorway Pec Stretch at 60 Elevation  - 1 x daily - 7 x weekly - 2 sets - 8 reps - 20 hold  ASSESSMENT:  CLINICAL IMPRESSION: Patient presents with good motivation overall today and continues to endorse more cervical than lumbar pain. He presents with increased tightness/limited ROM yet did respond favorably to manual stretching today. Reviewed HEP and cleared up questions with upper neck stretches and median nerve glide. He continues to follow all cues well and communicates his symptoms well. No pain exacerbation.  Pt will continue to benefit from skilled PT services to decrease pain and overall improve functional mobility  to optimize independence and reduce risk of falls.     OBJECTIVE IMPAIRMENTS Abnormal gait, cardiopulmonary status limiting activity, decreased activity tolerance, decreased balance, decreased coordination, decreased endurance, decreased mobility, difficulty walking, decreased ROM, decreased strength, increased fascial restrictions, impaired perceived functional ability, increased muscle spasms, impaired flexibility, impaired sensation, impaired UE functional use, improper body mechanics, obesity, and pain.  ACTIVITY LIMITATIONS carrying, lifting, bending, standing, squatting, sleeping, stairs, transfers, bed mobility, continence, dressing, reach over head, hygiene/grooming, and locomotion level  PARTICIPATION LIMITATIONS: meal prep, cleaning, laundry, shopping,  community activity, occupation, yard work, and lifting/exercise  PERSONAL FACTORS Time since onset of injury/illness/exacerbation and 3+ comorbidities: T2DM, HTN, Seizures  are also affecting patient's functional outcome.   REHAB POTENTIAL: Fair secondary to duration of symptoms and complicated MSK diagnosis hx  CLINICAL DECISION MAKING: Stable/uncomplicated  EVALUATION COMPLEXITY: Moderate   GOALS: Goals reviewed with patient? Yes  SHORT TERM GOALS: Target date: 12/27/2021  Pt will be Ind and compliant with HEP, to improve overall self-management of symptoms outside of therapy. Baseline: HEP provided Goal status: INITIAL   LONG TERM GOALS: Target date: 01/24/2022  Pt will report 2-3/10 pain at it's worst, to improve overall tolerance for upright functional mobility, and to improve overall quality of life. Baseline: 6-7/10 Goal status: INITIAL  Pt will improve FOTO to target score to 44%, to demonstrate clinically significant improvement in perceived functional mobility. Baseline:  Goal status: INITIAL  Pt will be asymptomatic with ULTT indicating improved muscle/nerve tension, for improved tolerance and ability to perform ADL's and IADL's. Baseline: median (+) with elbow extension Goal status: INITIAL  4.  Pt will increase shoulder AROM by 30deg grossly, to improve tolerance and independence with ADL's, and IADL's. Baseline: shoulder flexion 110deg Goal status: INITIAL  PLAN: PT FREQUENCY: 2x/week  PT DURATION: 8 weeks  PLANNED INTERVENTIONS: Therapeutic exercises, Therapeutic activity, Neuromuscular re-education, Balance training, Gait training, Patient/Family education, Self Care, Joint mobilization, Stair training, DME instructions, Aquatic Therapy, Dry Needling, Electrical stimulation, Spinal mobilization, Cryotherapy, Moist heat, scar mobilization, Traction, Ultrasound, Manual therapy, and Re-evaluation.  PLAN FOR NEXT SESSION: reassess median nerve glides, STM to  cervical spine, address cervical radiculopathy       4:56 PM,11/29/21

## 2021-12-06 ENCOUNTER — Encounter: Payer: Self-pay | Admitting: Physical Therapy

## 2021-12-06 ENCOUNTER — Ambulatory Visit: Payer: Medicare Other | Attending: Neurology | Admitting: Physical Therapy

## 2021-12-06 DIAGNOSIS — M25612 Stiffness of left shoulder, not elsewhere classified: Secondary | ICD-10-CM | POA: Diagnosis present

## 2021-12-06 DIAGNOSIS — Z7409 Other reduced mobility: Secondary | ICD-10-CM | POA: Diagnosis present

## 2021-12-06 DIAGNOSIS — M542 Cervicalgia: Secondary | ICD-10-CM | POA: Insufficient documentation

## 2021-12-06 DIAGNOSIS — E11649 Type 2 diabetes mellitus with hypoglycemia without coma: Secondary | ICD-10-CM | POA: Insufficient documentation

## 2021-12-06 DIAGNOSIS — M25611 Stiffness of right shoulder, not elsewhere classified: Secondary | ICD-10-CM | POA: Diagnosis present

## 2021-12-06 DIAGNOSIS — M5382 Other specified dorsopathies, cervical region: Secondary | ICD-10-CM | POA: Diagnosis present

## 2021-12-06 DIAGNOSIS — M792 Neuralgia and neuritis, unspecified: Secondary | ICD-10-CM | POA: Insufficient documentation

## 2021-12-06 NOTE — Therapy (Signed)
OUTPATIENT PHYSICAL THERAPY THORACOLUMBAR TREATMENT   Patient Name: Thomas Cole MRN: [id number] DOB:[birth year], 51 y.o., male Today's Date: 12/06/2021   PT End of Session - 12/06/21 1426     Visit Number 5    Number of Visits 16    Date for PT Re-Evaluation 12/25/21    Authorization Type BCBS Medicare    Progress Note Due on Visit 10    PT Start Time 1429    PT Stop Time 1512    PT Time Calculation (min) 43 min    Activity Tolerance Patient tolerated treatment well    Behavior During Therapy [facility] for tasks assessed/performed              Past Medical History:  Diagnosis Date   Anxiety    Asthma    Back problem    Benign essential hypertension 11/30/2016   Cervical spondylosis without myelopathy 7/51/0258   Complication of anesthesia    occ takes longer to wake   Depression    Diabetes mellitus without complication () 52/7782   Type 2   GERD (gastroesophageal reflux disease)    Hypertension    Labile hypertension 03/19/2013   No current neuro changes, headache much improved. No clear sign of intracranial bleed. NO indication for head CT>  BP much better now... Fluctuates a lot.  Will eval for pheochromocytoma. As recommended at last cardiology OV.. Will prescribe hydralazine to use as needed for BP fluctuations.  Close follow up with PCP.    Major depressive disorder, recurrent episode, severe ([place]) 06/23/2015   Migraine headache    Migraines 11/11/2013   S/P appendectomy 04/09/2011   Seizure ([place]) 11/30/2016   Seizures ([place])    last 87   Shortness of breath    SOB (shortness of breath) 08/06/2014   - [facility] eval with cpst 02/20/2008 exercise testing demonstrateed normal functional capabilities and a normal cardiopulmonary response to exercise. - 09/16/2014  Walked RA x 3 laps @ 185 ft each stopped due to end of study/ nl pace/ no desat  - spirometry 09/16/2014 > no obst/ min restriction      Past Surgical History:  Procedure Laterality Date   ANTERIOR CERVICAL  CORPECTOMY N/A 09/15/2012   Procedure: Cervical Three-Four,Cervical Six-Seven Anterior cervical decompression/diskectomy fusion with Cervical Four Corpectomy;  Surgeon: Kristeen Miss, MD;  Location: [place] [facility];  Service: Neurosurgery;  Laterality: N/A;  Cervical Three-Four,Cervical Six-Seven  Anterior cervical decompression/diskectomy fusion with Cervical four Corpectomy   APPENDECTOMY  12   CERVICAL DISC SURGERY     CHOLECYSTECTOMY N/A 03/29/2017   Procedure: LAPAROSCOPIC CHOLECYSTECTOMY;  Surgeon: Florene Glen, MD;  Location: [facility] ORS;  Service: General;  Laterality: N/A;   COLONOSCOPY WITH PROPOFOL N/A 10/23/2019   Procedure: COLONOSCOPY WITH PROPOFOL;  Surgeon: Robert Bellow, MD;  Location: [facility] ENDOSCOPY;  Service: Endoscopy;  Laterality: N/A;   ESOPHAGOGASTRODUODENOSCOPY (EGD) WITH PROPOFOL N/A 10/23/2019   Procedure: ESOPHAGOGASTRODUODENOSCOPY (EGD) WITH PROPOFOL;  Surgeon: Robert Bellow, MD;  Location: [facility] ENDOSCOPY;  Service: Endoscopy;  Laterality: N/A;   KNEE SURGERY     bilateral   POSTERIOR FUSION CERVICAL SPINE     Patient Active Problem List   Diagnosis Date Noted   Spinal stenosis, lumbar region, with neurogenic claudication 07/04/2021   Chronic radicular lumbar pain 07/04/2021   Neuroforaminal stenosis of lumbar spine (L3,4,5) 07/04/2021   Thoracic facet joint syndrome 09/24/2019   Chronic pain syndrome 07/14/2019   Failed cervical fusion 11/20/2018   Episode of recurrent major depressive  disorder ([place]) 11/20/2018   Myelopathy of cervical spinal cord with cervical radiculopathy ([place]) 05/13/2018   Bilateral occipital neuralgia 02/11/2018   Degeneration of cervical intervertebral disc with myelopathy 06/13/2017   H/O cervical spinal arthrodesis 05/16/2017   Thoracic radiculopathy 05/16/2017   Acute cholecystitis 03/29/2017   Anxiety 11/30/2016   Benign essential hypertension 11/30/2016   Seizure ([place]) 11/30/2016   Localization-related symptomatic epilepsy  and epileptic syndromes with complex partial seizures, not intractable, without status epilepticus ([place]) 02/21/2016   Generalized anxiety disorder 06/23/2015   Major depressive disorder, recurrent episode, severe ([place]) 06/23/2015   PTSD (post-traumatic stress disorder) 06/16/2015   SOB (shortness of breath) 08/06/2014   Obesity (BMI 30-39.9) 11/11/2013   Migraines 11/11/2013   Asthma, mild intermittent 11/11/2013   History of seizures 11/11/2013   GERD without esophagitis 11/11/2013   Depression 11/11/2013   Labile hypertension 03/19/2013   Spondylosis, cervical, with myelopathy 09/17/2012   S/P appendectomy 04/09/2011   Sleep apnea 05/25/2010    PCP: Derinda Late, MD  REFERRING PROVIDER: Vladimir Crofts, MD  REFERRING DIAG: Parkinson's Disease  Rationale for Evaluation and Treatment Rehabilitation  THERAPY DIAG:  Cervicalgia  Neuralgia and neuritis  Impaired range of motion of cervical spine  Impaired range of motion of both shoulders  ONSET DATE: 2013-2014  SUBJECTIVE:                                                                                                                                                                                           SUBJECTIVE STATEMENT: Pt reports some soreness from doing stuff outside yesterday such as working with his car performing up and down and other things of that nature. Pt has been doing HEP some but reports he needs to work on remembering them.   PERTINENT HISTORY:  Pt with extensive medical history concerning cervical, thoracic, and lumbar spine diagnoses including: thoracic radiculopathy, myelopathy of cervical spine cord, chronic pain syndrome, thoracic facet joint syndrome, hx lumbar laminectomy (1994), hx cervical fusion x2 (C3-4 ~53yr ago, C3-7 ~7-867yrago). Per pt, his MD has concern for myelomalacia of lower spinal cord. Pt with recent hospital admission in January of this year due to acute/chronic low back pain. Pt  notes episode of bowel/bladder incontinence, but denies saddle anesthesia.  Pt arrives to OPPT with c/o cervical and low back pain. Pt notes that this has been an ongoing problem for years, and it has been affecting his Independence at home, as well as his quality of life. Per pt, the MD said "he's one bad fall away from being done" as he states his MD has concern for myelomalacia. Pt notes  that symptoms are worse on the L arm and L leg. Pt enjoys being outside and working on cars. His significant other states he does daily walks down the driveway.  PAIN:  Are you having pain? Yes: NPRS scale: 7-8/10 Pain location: neck, back, lats   PRECAUTIONS: Fall  WEIGHT BEARING RESTRICTIONS No  FALLS:  Has patient fallen in last 6 months? Yes. Number of falls 1;  sitting on rollator without breaks locked; lost balance and fell off rollator  LIVING ENVIRONMENT: Lives with: lives alone Lives in: House/apartment Stairs: Yes: External: 3-4 steps; on left going up Has following equipment at home: Single point cane, Environmental consultant - 4 wheeled, Wheelchair (power), Wheelchair (manual), shower chair, and Grab bars. Lift chair recliner.  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 Severe spinal canal stenosis at the L2-L5 levels due to combination of disc bulges and congenitally narrow spinal canal. 2. Moderate right and severe left neural foraminal stenosis at L4-5 and moderate left neural foraminal stenosis at L5-S1. 3. No thoracic spinal canal or neural foraminal stenosis.   PATIENT SURVEYS:  FOTO 35%  COGNITION:  Overall cognitive status: Within functional limits for tasks assessed     SENSATION: Light touch: diminished sensation LLE compared to  RLE Touch discrimination: intact bil   COORDINATION:   Opposition: intact (R); bradykinetic (L) Finger to Nose: bradykinetic and undershooting (R); bradykinetic and undershooting (L)   Alternating Toe Tap: intact   Heel to Shin: intact bil, limited hip flexion ROM  CERVICAL ROM: (hx multiple cervical fusions)  Active  AROM  eval  Flexion 20deg  Extension 4deg  Right lateral flexion 10deg  Left lateral flexion 15deg  Right rotation 20deg  Left rotation 15deg, p!   (Blank rows = not tested)  UPPER EXTREMITY ROM:    Active  Right eval Left eval  Shoulder flexion 110 110    LOWER EXTREMITY ROM:    Limited seated hip flexion AROM (not associated with strength) HS length equal/WFL bil, no c/o neural tension Functionally observed limited hip ext bil  LOWER EXTREMITY MMT:    MMT Right eval Left eval  Hip flexion 4 4+  Knee flexion 4- 4  Knee extension 4 4+   (Blank rows = not tested)   FUNCTIONAL TESTS:  11/20/21:    5xSTS: 44.44 sec without UE support  10 m gait speed self-selected pace:    With SPC: 0.27 m/s   Without AD: .28 m/s  GAIT: Distance walked: 4f x2 Assistive device utilized: Single point cane Level of assistance: SBA Comments: limited hip ext bil, decreased R arm swing, SPC in L hand, decreased foot clearance bil, decreased step length, guarded movement    TODAY'S TREATMENT 12/06/21  Manual therapy:   Cervical rotation- Hold 45 sec x 4 each side Supine Suboccipital release- Hold 60 sec x2 Cervical SB - hold 45 sec x 4  Cervical manual traction 2x30sec STM to B scalenes/levator/B UT in supine UT stretch with GH depression 2 x 45 sec ea side  UT Stm and ischemic TP release bilaterally   Therapeutic Exercises:    Review of previous HEP-  Open book stretch 5 x 10 sec ea side   PATIENT EDUCATION:  Education details: HEP updates, functional status with 5xSTS and gait  speed Person educated: Patient and girlfriend Education method:  Explanation, Demonstration, Tactile cues, Verbal cues, and Handouts Education comprehension: verbalized understanding, verbal cues required, tactile cues required, and needs further education   HOME EXERCISE PROGRAM: Updated 11/20/21 Access Code: [email or web address] URL: [email or web address] Date: 11/20/2021 Prepared by: Larna Daughters  Exercises - Seated Gentle Upper Trapezius Stretch  - 1 x daily - 7 x weekly - 2 sets - 8 reps - 20 hold - Seated Levator Scapulae Stretch  - 1 x daily - 7 x weekly - 2 sets - 8 reps - 20 hold - Seated Scalenes Stretch  - 1 x daily - 7 x weekly - 2 sets - 8 reps - 20 hold - Doorway Pec Stretch at 60 Elevation  - 1 x daily - 7 x weekly - 2 sets - 8 reps - 20 hold - Median Nerve Glide  - 1 x daily - 7 x weekly - 2 sets - 10 reps - Supine Double Knee to Chest  - 1 x daily - 7 x weekly - 2 sets - 12 reps    Access Code: [email or web address] URL: [email or web address] Date: 11/13/2021 Prepared by: Amalia Hailey  Exercises - Seated Gentle Upper Trapezius Stretch  - 1 x daily - 7 x weekly - 2 sets - 8 reps - 20 hold - Seated Levator Scapulae Stretch  - 1 x daily - 7 x weekly - 2 sets - 8 reps - 20 hold - Seated Scalenes Stretch  - 1 x daily - 7 x weekly - 2 sets - 8 reps - 20 hold - Doorway Pec Stretch at 60 Elevation  - 1 x daily - 7 x weekly - 2 sets - 8 reps - 20 hold  ASSESSMENT:  CLINICAL IMPRESSION: Pt presents with continued pain in his cervical and thoracic spine this date. Pt reports he may have overdone it a little bit yesterday and has increased pain as a result. Pt continues to have pain in his upper traps as well as trigger points. Pt responds well to manual therapy with improved pain levels post session. Pt instructed to continue with HEP at home. Pt will continue to benefit from skilled physical therapy intervention to address impairments, improve QOL, and attain therapy goals.    OBJECTIVE IMPAIRMENTS Abnormal gait, cardiopulmonary  status limiting activity, decreased activity tolerance, decreased balance, decreased coordination, decreased endurance, decreased mobility, difficulty walking, decreased ROM, decreased strength, increased fascial restrictions, impaired perceived functional ability, increased muscle spasms, impaired flexibility, impaired sensation, impaired UE functional use, improper body mechanics, obesity, and pain.  ACTIVITY LIMITATIONS carrying, lifting, bending, standing, squatting, sleeping, stairs, transfers, bed mobility, continence, dressing, reach over head, hygiene/grooming, and locomotion level  PARTICIPATION LIMITATIONS: meal prep, cleaning, laundry, shopping, community activity, occupation, yard work, and lifting/exercise  PERSONAL FACTORS Time since onset of injury/illness/exacerbation and 3+ comorbidities: T2DM, HTN, Seizures  are also affecting patient's functional outcome.   REHAB POTENTIAL: Fair secondary to duration of symptoms and complicated MSK diagnosis hx  CLINICAL DECISION MAKING: Stable/uncomplicated  EVALUATION COMPLEXITY: Moderate   GOALS: Goals reviewed with patient? Yes  SHORT TERM GOALS: Target date: 01/03/2022  Pt will be Ind and compliant with HEP, to improve overall self-management of symptoms outside of therapy. Baseline: HEP provided Goal status: INITIAL   LONG TERM GOALS: Target date: 01/31/2022  Pt will report 2-3/10 pain at it's worst, to improve overall tolerance for upright functional mobility, and to improve overall quality of life. Baseline: 6-7/10 Goal status: INITIAL  Pt will improve FOTO  to target score to 44%, to demonstrate clinically significant improvement in perceived functional mobility. Baseline:  Goal status: INITIAL  Pt will be asymptomatic with ULTT indicating improved muscle/nerve tension, for improved tolerance and ability to perform ADL's and IADL's. Baseline: median (+) with elbow extension Goal status: INITIAL  4.  Pt will increase  shoulder AROM by 30deg grossly, to improve tolerance and independence with ADL's, and IADL's. Baseline: shoulder flexion 110deg Goal status: INITIAL  PLAN: PT FREQUENCY: 2x/week  PT DURATION: 8 weeks  PLANNED INTERVENTIONS: Therapeutic exercises, Therapeutic activity, Neuromuscular re-education, Balance training, Gait training, Patient/Family education, Self Care, Joint mobilization, Stair training, DME instructions, Aquatic Therapy, Dry Needling, Electrical stimulation, Spinal mobilization, Cryotherapy, Moist heat, scar mobilization, Traction, Ultrasound, Manual therapy, and Re-evaluation.  PLAN FOR NEXT SESSION: reassess median nerve glides, STM to cervical spine, address cervical radiculopathy, stair navigation training when appropriate.      Particia Lather PT  3:30 PM,12/06/21

## 2021-12-11 ENCOUNTER — Ambulatory Visit
Admission: RE | Admit: 2021-12-11 | Discharge: 2021-12-11 | Disposition: A | Discharge status: Home / Self Care | Payer: Medicare Other | Source: Ambulatory Visit | Attending: Student in an Organized Health Care Education/Training Program | Admitting: Student in an Organized Health Care Education/Training Program

## 2021-12-11 ENCOUNTER — Encounter: Payer: Self-pay | Admitting: Student in an Organized Health Care Education/Training Program

## 2021-12-11 ENCOUNTER — Ambulatory Visit
Payer: Medicare Other | Attending: Student in an Organized Health Care Education/Training Program | Admitting: Student in an Organized Health Care Education/Training Program

## 2021-12-11 VITALS — BP 129/88 | HR 92 | Resp 17 | Ht 75.0 in | Wt 308.0 lb

## 2021-12-11 DIAGNOSIS — G894 Chronic pain syndrome: Secondary | ICD-10-CM | POA: Insufficient documentation

## 2021-12-11 DIAGNOSIS — Z981 Arthrodesis status: Secondary | ICD-10-CM

## 2021-12-11 DIAGNOSIS — M960 Pseudarthrosis after fusion or arthrodesis: Secondary | ICD-10-CM | POA: Diagnosis present

## 2021-12-11 DIAGNOSIS — M5416 Radiculopathy, lumbar region: Secondary | ICD-10-CM

## 2021-12-11 DIAGNOSIS — M542 Cervicalgia: Secondary | ICD-10-CM

## 2021-12-11 MED ORDER — SODIUM CHLORIDE 0.9% FLUSH
2.0000 mL | Freq: Once | INTRAVENOUS | Status: AC
  Administered 2021-12-11: 2 mL

## 2021-12-11 MED ORDER — IOHEXOL 180 MG/ML  SOLN
10.0000 mL | Freq: Once | INTRAMUSCULAR | Status: AC
  Administered 2021-12-11: 10 mL via EPIDURAL
  Filled 2021-12-11: qty 20

## 2021-12-11 MED ORDER — LIDOCAINE HCL 2 % IJ SOLN
20.0000 mL | Freq: Once | INTRAMUSCULAR | Status: AC
  Administered 2021-12-11: 400 mg
  Filled 2021-12-11: qty 20

## 2021-12-11 MED ORDER — DIAZEPAM 5 MG PO TABS
10.0000 mg | ORAL_TABLET | Freq: Once | ORAL | Status: AC
  Administered 2021-12-11: 10 mg via ORAL

## 2021-12-11 MED ORDER — ROPIVACAINE HCL 2 MG/ML IJ SOLN
2.0000 mL | Freq: Once | INTRAMUSCULAR | Status: AC
  Administered 2021-12-11: 2 mL via EPIDURAL
  Filled 2021-12-11: qty 20

## 2021-12-11 MED ORDER — SODIUM CHLORIDE (PF) 0.9 % IJ SOLN
INTRAMUSCULAR | Status: AC
  Filled 2021-12-11: qty 10

## 2021-12-11 MED ORDER — DIAZEPAM 5 MG PO TABS: 10.0000 mg | ORAL_TABLET | ORAL | Status: DC

## 2021-12-11 MED ORDER — DIAZEPAM 5 MG PO TABS
ORAL_TABLET | ORAL | Status: AC
  Filled 2021-12-11: qty 2

## 2021-12-11 MED ORDER — DEXAMETHASONE SODIUM PHOSPHATE 10 MG/ML IJ SOLN
10.0000 mg | Freq: Once | INTRAMUSCULAR | Status: AC
  Administered 2021-12-11: 10 mg
  Filled 2021-12-11: qty 1

## 2021-12-11 NOTE — Progress Notes (Signed)
Safety precautions to be maintained throughout the outpatient stay will include: orient to surroundings, keep bed in low position, maintain call bell within reach at all times, provide assistance with transfer out of bed and ambulation.  

## 2021-12-11 NOTE — Progress Notes (Signed)
[place];PROVIDER NOTE: Interpretation of information contained herein should be left to medically-trained personnel. Specific patient instructions are provided elsewhere under "Patient Instructions" section of medical record. This document was created in part using STT-dictation technology, any transcriptional errors that may result from this process are unintentional.  Patient: Thomas Cole Type: Established DOB: [birth year] MRN: [id number] PCP: Derinda Late, MD  Service: Procedure DOS: 12/11/2021 Setting: Ambulatory Location: Ambulatory outpatient facility Delivery: Face-to-face Provider: Gillis Santa, MD Specialty: Interventional Pain Management Specialty designation: 09 Location: Outpatient facility Ref. Prov.: Derinda Late, MD    Primary Reason for Visit: Interventional Pain Management Treatment. CC: Back Pain (low) and Shoulder Pain (right)   Procedure:           Type: Lumbar epidural steroid injection (LESI) (interlaminar) #3 and bilateral cervical trigger point injections done, for myofascial pain syndrome of cervical spine and trapezius, right greater than left    Laterality: Left   Level:  L3-4 Level.  Imaging: Fluoroscopic guidance Anesthesia: Local anesthesia (1-2% Lidocaine) Anxiolysis: Oral Valium 10 mg Sedation:  minimal . DOS: 12/11/2021  Performed by: Gillis Santa, MD  Purpose: Diagnostic/Therapeutic Indications: Lumbar radicular pain of intraspinal etiology of more than 4 weeks that has failed to respond to conservative therapy and is severe enough to impact quality of life or function. 1. Lumbar radiculopathy (L>R)   2. Chronic pain syndrome   3. Failed cervical fusion   4. H/O cervical spinal arthrodesis   5. Cervicalgia     NAS-11 Pain score:   Pre-procedure: 6 /10   Post-procedure: 0-No pain/10      Position / Prep / Materials:  Position: Prone w/ head of the table raised (slight reverse trendelenburg) to facilitate breathing.  Prep solution:  DuraPrep (Iodine Povacrylex [0.7% available iodine] and Isopropyl Alcohol, 74% w/w) Prep Area: Entire Posterior Lumbar Region from lower scapular tip down to mid buttocks area and from flank to flank. Materials:  Tray: Epidural tray Needle(s):  Type: Epidural needle          Gauge (G):  17 Length: Regular (3.5-in) Qty: 1  Pre-op H&P Assessment:  Thomas Cole is a 51 y.o. (year old), male patient, seen today for interventional treatment. He  has a past surgical history that includes Knee surgery; Posterior fusion cervical spine; Cervical disc surgery; Appendectomy (12); Anterior cervical corpectomy (N/A, 09/15/2012); Cholecystectomy (N/A, 03/29/2017); Colonoscopy with propofol (N/A, 10/23/2019); and Esophagogastroduodenoscopy (egd) with propofol (N/A, 10/23/2019). Thomas Cole has a current medication list which includes the following prescription(s): carbidopa-levodopa, carbidopa-levodopa, vitamin d3, clonazepam, duloxetine, furosemide, glipizide, hydrochlorothiazide, hydrocodone bitartrate er, [START ON 12/22/2021] hydrocodone-acetaminophen, losartan, metformin, metoprolol succinate, nortriptyline, omeprazole, phenytoin, pregabalin, sildenafil, tizanidine, cyanocobalamin, and ubrelvy, and the following Facility-Administered Medications: diazepam. His primarily concern today is the Back Pain (low) and Shoulder Pain (right)  Initial Vital Signs:  Pulse/HCG Rate: 92ECG Heart Rate: 92 Temp:   Resp: 18 BP: 131/89 SpO2: 96 %  BMI: Estimated body mass index is 38.5 kg/m as calculated from the following:   Height as of this encounter: '6\' 3"'$  (1.905 m).   Weight as of this encounter: 308 lb (139.7 kg).  Risk Assessment: Allergies: Reviewed. He is allergic to amitriptyline, bee venom, chlorhexidine, carbamazepine, meloxicam, morphine and related, and sulfa antibiotics.  Allergy Precautions: None required Coagulopathies: Reviewed. None identified.  Blood-thinner therapy: None at this time Active  Infection(s): Reviewed. None identified. Thomas Cole is afebrile  Site Confirmation: Thomas Cole was asked to confirm the procedure and laterality before marking the site Procedure checklist: Completed  Consent: Before the procedure and under the influence of no sedative(s), amnesic(s), or anxiolytics, the patient was informed of the treatment options, risks and possible complications. To fulfill our ethical and legal obligations, as recommended by the American Medical Association's Code of Ethics, I have informed the patient of my clinical impression; the nature and purpose of the treatment or procedure; the risks, benefits, and possible complications of the intervention; the alternatives, including doing nothing; the risk(s) and benefit(s) of the alternative treatment(s) or procedure(s); and the risk(s) and benefit(s) of doing nothing. The patient was provided information about the general risks and possible complications associated with the procedure. These may include, but are not limited to: failure to achieve desired goals, infection, bleeding, organ or nerve damage, allergic reactions, paralysis, and death. In addition, the patient was informed of those risks and complications associated to Spine-related procedures, such as failure to decrease pain; infection (i.e.: Meningitis, epidural or intraspinal abscess); bleeding (i.e.: epidural hematoma, subarachnoid hemorrhage, or any other type of intraspinal or peri-dural bleeding); organ or nerve damage (i.e.: Any type of peripheral nerve, nerve root, or spinal cord injury) with subsequent damage to sensory, motor, and/or autonomic systems, resulting in permanent pain, numbness, and/or weakness of one or several areas of the body; allergic reactions; (i.e.: anaphylactic reaction); and/or death. Furthermore, the patient was informed of those risks and complications associated with the medications. These include, but are not limited to: allergic reactions (i.e.:  anaphylactic or anaphylactoid reaction(s)); adrenal axis suppression; blood sugar elevation that in diabetics may result in ketoacidosis or comma; water retention that in patients with history of congestive heart failure may result in shortness of breath, pulmonary edema, and decompensation with resultant heart failure; weight gain; swelling or edema; medication-induced neural toxicity; particulate matter embolism and blood vessel occlusion with resultant organ, and/or nervous system infarction; and/or aseptic necrosis of one or more joints. Finally, the patient was informed that Medicine is not an exact science; therefore, there is also the possibility of unforeseen or unpredictable risks and/or possible complications that may result in a catastrophic outcome. The patient indicated having understood very clearly. We have given the patient no guarantees and we have made no promises. Enough time was given to the patient to ask questions, all of which were answered to the patient's satisfaction. Thomas Cole has indicated that he wanted to continue with the procedure. Attestation: I, the ordering provider, attest that I have discussed with the patient the benefits, risks, side-effects, alternatives, likelihood of achieving goals, and potential problems during recovery for the procedure that I have provided informed consent. Date  Time: 12/11/2021 11:03 AM  Pre-Procedure Preparation:  Monitoring: As per clinic protocol. Respiration, ETCO2, SpO2, BP, heart rate and rhythm monitor placed and checked for adequate function Safety Precautions: Patient was assessed for positional comfort and pressure points before starting the procedure. Time-out: I initiated and conducted the "Time-out" before starting the procedure, as per protocol. The patient was asked to participate by confirming the accuracy of the "Time Out" information. Verification of the correct person, site, and procedure were performed and confirmed by me,  the nursing staff, and the patient. "Time-out" conducted as per Joint Commission's Universal Protocol (UP.01.01.01). Time: 1137  Description/Narrative of Procedure:          Target: Epidural space via interlaminar opening, initially targeting the lower laminar border of the superior vertebral body. Region: Lumbar Approach: Percutaneous paravertebral  Rationale (medical necessity): procedure needed and proper for the diagnosis and/or treatment of the patient's medical  symptoms and needs. Procedural Technique Safety Precautions: Aspiration looking for blood return was conducted prior to all injections. At no point did we inject any substances, as a needle was being advanced. No attempts were made at seeking any paresthesias. Safe injection practices and needle disposal techniques used. Medications properly checked for expiration dates. SDV (single dose vial) medications used. Description of the Procedure: Protocol guidelines were followed. The procedure needle was introduced through the skin, ipsilateral to the reported pain, and advanced to the target area. Bone was contacted and the needle walked caudad, until the lamina was cleared. The epidural space was identified using "loss-of-resistance technique" with 2-3 ml of PF-NaCl (0.9% NSS), in a 5cc LOR glass syringe.  Vitals:   12/11/21 1131 12/11/21 1136 12/11/21 1142 12/11/21 1146  BP: (!) 136/93 (!) 126/97 137/86 129/88  Pulse:      Resp: 17 12 (!) 9 17  SpO2: 95% 96% 96% 97%  Weight:      Height:          6.5 cc solution made of 3.5cc of preservative-free saline, 2 cc of 0.2% ropivacaine, 1 cc of Decadron 10 mg/cc.  Afterwards bilateral trigger point injections were done focusing more along the right cervical paraspinal muscles and right trapezius.  Needling was performed and 0.5 to 1 cc of 0.2% ropivacaine was injected into each trigger point.   Start Time: 1137 hrs. End Time: 1145 hrs.  Imaging Guidance (Spinal):          Type of  Imaging Technique: Fluoroscopy Guidance (Spinal) Indication(s): Assistance in needle guidance and placement for procedures requiring needle placement in or near specific anatomical locations not easily accessible without such assistance. Exposure Time: Please see nurses notes. Contrast: Before injecting any contrast, we confirmed that the patient did not have an allergy to iodine, shellfish, or radiological contrast. Once satisfactory needle placement was completed at the desired level, radiological contrast was injected. Contrast injected under live fluoroscopy. No contrast complications. See chart for type and volume of contrast used. Fluoroscopic Guidance: I was personally present during the use of fluoroscopy. "Tunnel Vision Technique" used to obtain the best possible view of the target area. Parallax error corrected before commencing the procedure. "Direction-depth-direction" technique used to introduce the needle under continuous pulsed fluoroscopy. Once target was reached, antero-posterior, oblique, and lateral fluoroscopic projection used confirm needle placement in all planes. Images permanently stored in EMR. Interpretation: I personally interpreted the imaging intraoperatively. Adequate needle placement confirmed in multiple planes. Appropriate spread of contrast into desired area was observed. No evidence of afferent or efferent intravascular uptake. No intrathecal or subarachnoid spread observed. Permanent images saved into the patient's record.   Post-operative Assessment:  Post-procedure Vital Signs:  Pulse/HCG Rate: 9290 Temp:   Resp: 17 BP: 129/88 SpO2: 97 %  EBL: None  Complications: No immediate post-treatment complications observed by team, or reported by patient.  Note: The patient tolerated the entire procedure well. A repeat set of vitals were taken after the procedure and the patient was kept under observation following institutional policy, for this type of procedure.  Post-procedural neurological assessment was performed, showing return to baseline, prior to discharge. The patient was provided with post-procedure discharge instructions, including a section on how to identify potential problems. Should any problems arise concerning this procedure, the patient was given instructions to immediately contact us, at any time, without hesitation. In any case, we plan to contact the patient by telephone for a follow-up status report regarding this interventional procedure.  Comments:  No additional relevant information.  Plan of Care  Orders:  Orders Placed This Encounter  Procedures   DG PAIN [facility] C-ARM 1-60 MIN NO REPORT    Intraoperative interpretation by procedural physician at [place].    Standing Status:   Standing    Number of Occurrences:   1    Order Specific Question:   Reason for exam:    Answer:   Assistance in needle guidance and placement for procedures requiring needle placement in or near specific anatomical locations not easily accessible without such assistance.   Chronic Opioid Analgesic:  Hydrocodone extended release 10 mg twice daily, hydrocodone immediate release 10 mg 3 times daily as needed for breakthrough pain    Medications ordered for procedure: Meds ordered this encounter  Medications   iohexol (OMNIPAQUE) 180 MG/ML injection 10 mL    Must be Myelogram-compatible. If not available, you may substitute with a water-soluble, non-ionic, hypoallergenic, myelogram-compatible radiological contrast medium.   lidocaine (XYLOCAINE) 2 % (with pres) injection 400 mg   diazepam (VALIUM) tablet 10 mg    Make sure Flumazenil is available in the pyxis when using this medication. If oversedation occurs, administer 0.2 mg IV over 15 sec. If after 45 sec no response, administer 0.2 mg again over 1 min; may repeat at 1 min intervals; not to exceed 4 doses (1 mg)   sodium chloride flush (NS) 0.9 % injection 2 mL   ropivacaine (PF) 2  mg/mL (0.2%) (NAROPIN) injection 2 mL   dexamethasone (DECADRON) injection 10 mg   diazepam (VALIUM) tablet 10 mg    Make sure Flumazenil is available in the pyxis when using this medication. If oversedation occurs, administer 0.2 mg IV over 15 sec. If after 45 sec no response, administer 0.2 mg again over 1 min; may repeat at 1 min intervals; not to exceed 4 doses (1 mg)   Medications administered: We administered iohexol, lidocaine, diazepam, sodium chloride flush, ropivacaine (PF) 2 mg/mL (0.2%), and dexamethasone.  See the medical record for exact dosing, route, and time of administration.  Follow-up plan:   Return for Keep sch. appt.      Recent Visits Date Type Provider Dept  10/17/21 Office Visit Gillis Santa, MD [facility]  Showing recent visits within past 90 days and meeting all other requirements Today's Visits Date Type Provider Dept  12/11/21 Procedure visit Gillis Santa, MD [facility]  Showing today's visits and meeting all other requirements Future Appointments Date Type Provider Dept  01/18/22 Appointment Gillis Santa, MD [facility]  Showing future appointments within next 90 days and meeting all other requirements  Disposition: Discharge home  Discharge (Date  Time): 12/11/2021; 1151 hrs.   Primary Care Physician: Derinda Late, MD Location: [facility] Note by: Gillis Santa, MD Date: 12/11/2021; Time: 11:54 AM  Disclaimer:  Medicine is not an exact science. The only guarantee in medicine is that nothing is guaranteed. It is important to note that the decision to proceed with this intervention was based on the information collected from the patient. The Data and conclusions were drawn from the patient's questionnaire, the interview, and the physical examination. Because the information was provided in large part by the patient, it cannot be guaranteed that it has not been purposely or unconsciously  manipulated. Every effort has been made to obtain as much relevant data as possible for this evaluation. It is important to note that the conclusions that lead to this procedure  are derived in large part from the available data. Always take into account that the treatment will also be dependent on availability of resources and existing treatment guidelines, considered by other Pain Management Practitioners as being common knowledge and practice, at the time of the intervention. For Medico-Legal purposes, it is also important to point out that variation in procedural techniques and pharmacological choices are the acceptable norm. The indications, contraindications, technique, and results of the above procedure should only be interpreted and judged by a Board-Certified Interventional Pain Specialist with extensive familiarity and expertise in the same exact procedure and technique.

## 2021-12-11 NOTE — Patient Instructions (Signed)

## 2021-12-12 ENCOUNTER — Telehealth: Payer: Self-pay | Admitting: *Deleted

## 2021-12-12 NOTE — Telephone Encounter (Signed)
Post procedure call;  patient reports that he is doing well.

## 2021-12-13 ENCOUNTER — Encounter: Payer: Self-pay | Admitting: Physical Therapy

## 2021-12-13 ENCOUNTER — Ambulatory Visit: Payer: Medicare Other

## 2021-12-13 DIAGNOSIS — M542 Cervicalgia: Secondary | ICD-10-CM

## 2021-12-13 DIAGNOSIS — Z7409 Other reduced mobility: Secondary | ICD-10-CM

## 2021-12-13 DIAGNOSIS — M792 Neuralgia and neuritis, unspecified: Secondary | ICD-10-CM

## 2021-12-13 DIAGNOSIS — M5382 Other specified dorsopathies, cervical region: Secondary | ICD-10-CM

## 2021-12-13 DIAGNOSIS — M25611 Stiffness of right shoulder, not elsewhere classified: Secondary | ICD-10-CM

## 2021-12-13 NOTE — Therapy (Signed)
OUTPATIENT PHYSICAL THERAPY THORACOLUMBAR TREATMENT   Patient Name: KRIKOR WILLET MRN: [id number] DOB:12-04-1970, 51 y.o., male Today's Date: 12/13/2021   PT End of Session - 12/13/21 1339     Visit Number 6    Number of Visits 16    Date for PT Re-Evaluation 12/25/21    Authorization Type BC[employer] Medicare    Progress Note Due on Visit 10    PT Start Time 1344    PT Stop Time 1429    PT Time Calculation (min) 45 min    Activity Tolerance Patient tolerated treatment well    Behavior During Therapy [facility] for tasks assessed/performed              Past Medical History:  Diagnosis Date   Anxiety    Asthma    Back problem    Benign essential hypertension 11/30/2016   Cervical spondylosis without myelopathy 4/70/9628   Complication of anesthesia    occ takes longer to wake   Depression    Diabetes mellitus without complication ([place]) 36/6294   Type 2   GERD (gastroesophageal reflux disease)    Hypertension    Labile hypertension 03/19/2013   No current neuro changes, headache much improved. No clear sign of intracranial bleed. NO indication for head CT>  BP much better now... Fluctuates a lot.  Will eval for pheochromocytoma. As recommended at last cardiology OV.. Will prescribe hydralazine to use as needed for BP fluctuations.  Close follow up with PCP.    Major depressive disorder, recurrent episode, severe ([place]) 06/23/2015   Migraine headache    Migraines 11/11/2013   S/P appendectomy 04/09/2011   Seizure ([place]) 11/30/2016   Seizures ([place])    last 87   Shortness of breath    SOB (shortness of breath) 08/06/2014   - [facility] eval with cpst 02/20/2008 exercise testing demonstrateed normal functional capabilities and a normal cardiopulmonary response to exercise. - 09/16/2014  Walked RA x 3 laps @ 185 ft each stopped due to end of study/ nl pace/ no desat  - spirometry 09/16/2014 > no obst/ min restriction      Past Surgical History:  Procedure Laterality Date   ANTERIOR CERVICAL  CORPECTOMY N/A 09/15/2012   Procedure: Cervical Three-Four,Cervical Six-Seven Anterior cervical decompression/diskectomy fusion with Cervical Four Corpectomy;  Surgeon: Kristeen Miss, MD;  Location: [place] [facility]URO ORS;  Service: Neurosurgery;  Laterality: N/A;  Cervical Three-Four,Cervical Six-Seven  Anterior cervical decompression/diskectomy fusion with Cervical four Corpectomy   APPENDECTOMY  12   CERVICAL DISC SURGERY     CHOLECYSTECTOMY N/A 03/29/2017   Procedure: LAPAROSCOPIC CHOLECYSTECTOMY;  Surgeon: Florene Glen, MD;  Location: [facility] ORS;  Service: General;  Laterality: N/A;   COLONOSCOPY WITH PROPOFOL N/A 10/23/2019   Procedure: COLONOSCOPY WITH PROPOFOL;  Surgeon: Robert Bellow, MD;  Location: [facility] ENDOSCOPY;  Service: Endoscopy;  Laterality: N/A;   ESOPHAGOGASTRODUODENOSCOPY (EGD) WITH PROPOFOL N/A 10/23/2019   Procedure: ESOPHAGOGASTRODUODENOSCOPY (EGD) WITH PROPOFOL;  Surgeon: Robert Bellow, MD;  Location: [facility] ENDOSCOPY;  Service: Endoscopy;  Laterality: N/A;   KNEE SURGERY     bilateral   POSTERIOR FUSION CERVICAL SPINE     Patient Active Problem List   Diagnosis Date Noted   Spinal stenosis, lumbar region, with neurogenic claudication 07/04/2021   Chronic radicular lumbar pain 07/04/2021   Neuroforaminal stenosis of lumbar spine (L3,4,5) 07/04/2021   Thoracic facet joint syndrome 09/24/2019   Chronic pain syndrome 07/14/2019   Failed cervical fusion 11/20/2018   Episode of recurrent major depressive  disorder ([place]) 11/20/2018   Myelopathy of cervical spinal cord with cervical radiculopathy ([place]) 05/13/2018   Bilateral occipital neuralgia 02/11/2018   Degeneration of cervical intervertebral disc with myelopathy 06/13/2017   H/O cervical spinal arthrodesis 05/16/2017   Thoracic radiculopathy 05/16/2017   Acute cholecystitis 03/29/2017   Anxiety 11/30/2016   Benign essential hypertension 11/30/2016   Seizure ([place]) 11/30/2016   Localization-related symptomatic epilepsy  and epileptic syndromes with complex partial seizures, not intractable, without status epilepticus ([place]) 02/21/2016   Generalized anxiety disorder 06/23/2015   Major depressive disorder, recurrent episode, severe ([place]) 06/23/2015   PTSD (post-traumatic stress disorder) 06/16/2015   SOB (shortness of breath) 08/06/2014   Obesity (BMI 30-39.9) 11/11/2013   Migraines 11/11/2013   Asthma, mild intermittent 11/11/2013   History of seizures 11/11/2013   GERD without esophagitis 11/11/2013   Depression 11/11/2013   Labile hypertension 03/19/2013   Spondylosis, cervical, with myelopathy 09/17/2012   S/P appendectomy 04/09/2011   Sleep apnea 05/25/2010    PCP: Derinda Late, MD  REFERRING PROVIDER: Vladimir Crofts, MD  REFERRING DIAG: Parkinson's Disease  Rationale for Evaluation and Treatment Rehabilitation  THERAPY DIAG:  Cervicalgia  Neuralgia and neuritis  Impaired range of motion of cervical spine  Impaired range of motion of both shoulders  Impaired functional mobility, balance, gait, and endurance  ONSET DATE: 2013-2014  SUBJECTIVE:                                                                                                                                                                                           SUBJECTIVE STATEMENT: Pt reports some relief from injections in cervical spine and low back. 4/10 in cervical spine and 5/10 for low back.   PERTINENT HISTORY:  Pt with extensive medical history concerning cervical, thoracic, and lumbar spine diagnoses including: thoracic radiculopathy, myelopathy of cervical spine cord, chronic pain syndrome, thoracic facet joint syndrome, hx lumbar laminectomy (1994), hx cervical fusion x2 (C3-4 ~67yr ago, C3-7 ~7-89yrago). Per pt, his MD has concern for myelomalacia of lower spinal cord. Pt with recent hospital admission in January of this year due to acute/chronic low back pain. Pt notes episode of bowel/bladder incontinence,  but denies saddle anesthesia.  Pt arrives to OPPT with c/o cervical and low back pain. Pt notes that this has been an ongoing problem for years, and it has been affecting his Independence at home, as well as his quality of life. Per pt, the MD said "he's one bad fall away from being done" as he states his MD has concern for myelomalacia. Pt notes that symptoms are worse on the L arm and L leg.  Pt enjoys being outside and working on cars. His significant other states he does daily walks down the driveway.  PAIN:  Are you having pain? Yes: NPRS scale: 7-8/10 Pain location: neck, back, lats   PRECAUTIONS: Fall  WEIGHT BEARING RESTRICTIONS No  FALLS:  Has patient fallen in last 6 months? Yes. Number of falls 1;  sitting on rollator without breaks locked; lost balance and fell off rollator  LIVING ENVIRONMENT: Lives with: lives alone Lives in: House/apartment Stairs: Yes: External: 3-4 steps; on left going up Has following equipment at home: Single point cane, Environmental consultant - 4 wheeled, Wheelchair (power), Wheelchair (manual), shower chair, and Grab bars. Lift chair recliner.  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 Severe spinal canal stenosis at the L2-L5 levels due to combination of disc bulges and congenitally narrow spinal canal. 2. Moderate right and severe left neural foraminal stenosis at L4-5 and moderate left neural foraminal stenosis at L5-S1. 3. No thoracic spinal canal or neural foraminal stenosis.   PATIENT SURVEYS:  FOTO 35%  COGNITION:  Overall cognitive status: Within functional limits for tasks assessed     SENSATION: Light touch: diminished sensation LLE compared to RLE Touch discrimination: intact  bil   COORDINATION:   Opposition: intact (R); bradykinetic (L) Finger to Nose: bradykinetic and undershooting (R); bradykinetic and undershooting (L)   Alternating Toe Tap: intact   Heel to Shin: intact bil, limited hip flexion ROM  CERVICAL ROM: (hx multiple cervical fusions)  Active  AROM  eval  Flexion 20deg  Extension 4deg  Right lateral flexion 10deg  Left lateral flexion 15deg  Right rotation 20deg  Left rotation 15deg, p!   (Blank rows = not tested)  UPPER EXTREMITY ROM:    Active  Right eval Left eval  Shoulder flexion 110 110    LOWER EXTREMITY ROM:    Limited seated hip flexion AROM (not associated with strength) HS length equal/WFL bil, no c/o neural tension Functionally observed limited hip ext bil  LOWER EXTREMITY MMT:    MMT Right eval Left eval  Hip flexion 4 4+  Knee flexion 4- 4  Knee extension 4 4+   (Blank rows = not tested)   FUNCTIONAL TESTS:  11/20/21:    5xSTS: 44.44 sec without UE support  10 m gait speed self-selected pace:    With SPC: 0.27 m/s   Without AD: .28 m/s  GAIT: Distance walked: 88f x2 Assistive device utilized: Single point cane Level of assistance: SBA Comments: limited hip ext bil, decreased R arm swing, SPC in L hand, decreased foot clearance bil, decreased step length, guarded movement    TODAY'S TREATMENT 12/13/21  Manual therapy: 20 minutes    Cervical rotation- Hold 45 sec x 4 each side Supine Suboccipital release- Hold 60 sec x2 Cervical SB - hold 45 sec x 4  Cervical manual traction 4x30sec UT stretch with GH depression 4x 45 sec ea side  Suboccipital release: 2 minutes total to reduce muscular tension and pain  Manual cervical traction: 4x 15 sec to decrease joint compression.     Therapeutic Exercises:      Open book stretch 5 x 10 sec/side Over head raises AAROM with dowel for thoracic extension: 3x12 Supine chin tucks: 3x12 Seated cervical rotations: R/L x12  Seated cervical  flexion/extension: x12 R/L Supine snow angels: 2x12    Reassessment of cervical AROM:   Flexion: 21 deg  Extension: 25 deg  Rotation R/L: 40/40 deg  Lateral flexion R/L: 17/17   PATIENT EDUCATION:  Education details: HEP updates, exercises, Progress in AROM of cervical spine Person educated: Patient and girlfriend Education method: Explanation, Demonstration, Tactile cues, Verbal cues, and Handouts Education comprehension: verbalized understanding, verbal cues required, tactile cues required, and needs further education   HOME EXERCISE PROGRAM: Updated 11/20/21 Access Code: [email or web address] URL: [email or web address] Date: 11/20/2021 Prepared by: Larna Daughters  Exercises - Seated Gentle Upper Trapezius Stretch  - 1 x daily - 7 x weekly - 2 sets - 8 reps - 20 hold - Seated Levator Scapulae Stretch  - 1 x daily - 7 x weekly - 2 sets - 8 reps - 20 hold - Seated Scalenes Stretch  - 1 x daily - 7 x weekly - 2 sets - 8 reps - 20 hold - Doorway Pec Stretch at 60 Elevation  - 1 x daily - 7 x weekly - 2 sets - 8 reps - 20 hold - Median Nerve Glide  - 1 x daily - 7 x weekly - 2 sets - 10 reps - Supine Double Knee to Chest  - 1 x daily - 7 x weekly - 2 sets - 12 reps    Access Code: [email or web address] URL: [email or web address] Date: 11/13/2021 Prepared by: Amalia Hailey  Exercises - Seated Gentle Upper Trapezius Stretch  - 1 x daily - 7 x weekly - 2 sets - 8 reps - 20 hold - Seated Levator Scapulae Stretch  - 1 x daily - 7 x weekly - 2 sets - 8 reps - 20 hold - Seated Scalenes Stretch  - 1 x daily - 7 x weekly - 2 sets - 8 reps - 20 hold - Doorway Pec Stretch at 60 Elevation  - 1 x daily - 7 x weekly - 2 sets - 8 reps - 20 hold  ASSESSMENT:  CLINICAL IMPRESSION: Pt presents with excellent motivation. Pt presents with decreased pain levels since injections in cervical spine and lumbar spine. Pt tolerating progression of therex without exacerbation of symptoms. Education  provided on repeated cervical retractions in attempts to improve radicular symptoms with centralization. Notable improvements significantly in rotation and extension without pain. Pain remains 4/10 post treatment but pt endorses reduction in cervical stiffness.    OBJECTIVE IMPAIRMENTS Abnormal gait, cardiopulmonary status limiting activity, decreased activity tolerance, decreased balance, decreased coordination, decreased endurance, decreased mobility, difficulty walking, decreased ROM, decreased strength, increased fascial restrictions, impaired perceived functional ability, increased muscle spasms, impaired flexibility, impaired sensation, impaired UE functional use, improper body mechanics, obesity, and pain.  ACTIVITY LIMITATIONS carrying, lifting, bending, standing, squatting, sleeping, stairs, transfers, bed mobility, continence, dressing, reach over head, hygiene/grooming, and locomotion level  PARTICIPATION LIMITATIONS: meal prep, cleaning, laundry, shopping, community activity, occupation, yard work, and lifting/exercise  PERSONAL FACTORS Time since onset of injury/illness/exacerbation and 3+ comorbidities: T2DM, HTN, Seizures  are also affecting patient's functional outcome.   REHAB POTENTIAL: Fair secondary to duration of symptoms and complicated MSK diagnosis hx  CLINICAL DECISION MAKING: Stable/uncomplicated  EVALUATION COMPLEXITY: Moderate   GOALS: Goals reviewed with patient? Yes  SHORT TERM GOALS: Target date: 01/10/2022  Pt will be Ind and compliant with HEP, to improve overall self-management of symptoms outside of therapy. Baseline: HEP provided Goal status: INITIAL   LONG TERM GOALS: Target date: 02/07/2022  Pt will report 2-3/10  pain at it's worst, to improve overall tolerance for upright functional mobility, and to improve overall quality of life. Baseline: 6-7/10 Goal status: INITIAL  Pt will improve FOTO to target score to 44%, to demonstrate clinically  significant improvement in perceived functional mobility. Baseline:  Goal status: INITIAL  Pt will be asymptomatic with ULTT indicating improved muscle/nerve tension, for improved tolerance and ability to perform ADL's and IADL's. Baseline: median (+) with elbow extension Goal status: INITIAL  4.  Pt will increase shoulder AROM by 30deg grossly, to improve tolerance and independence with ADL's, and IADL's. Baseline: shoulder flexion 110deg Goal status: INITIAL  PLAN: PT FREQUENCY: 2x/week  PT DURATION: 8 weeks  PLANNED INTERVENTIONS: Therapeutic exercises, Therapeutic activity, Neuromuscular re-education, Balance training, Gait training, Patient/Family education, Self Care, Joint mobilization, Stair training, DME instructions, Aquatic Therapy, Dry Needling, Electrical stimulation, Spinal mobilization, Cryotherapy, Moist heat, scar mobilization, Traction, Ultrasound, Manual therapy, and Re-evaluation.  PLAN FOR NEXT SESSION: progress spinal mobility    Jaliza Seifried M. Fairly IV, PT, DPT Physical Therapist- [facility]  2:36 PM,12/13/21

## 2021-12-20 ENCOUNTER — Ambulatory Visit: Payer: Medicare Other | Admitting: Physical Therapy

## 2021-12-25 ENCOUNTER — Telehealth: Payer: Self-pay | Admitting: Student in an Organized Health Care Education/Training Program

## 2021-12-25 ENCOUNTER — Encounter: Payer: Self-pay | Admitting: Dietician

## 2021-12-25 ENCOUNTER — Encounter: Payer: Medicare Other | Attending: Family Medicine | Admitting: Dietician

## 2021-12-25 ENCOUNTER — Other Ambulatory Visit: Payer: Self-pay | Admitting: *Deleted

## 2021-12-25 VITALS — Ht 74.5 in | Wt 311.4 lb

## 2021-12-25 DIAGNOSIS — G894 Chronic pain syndrome: Secondary | ICD-10-CM

## 2021-12-25 DIAGNOSIS — G959 Disease of spinal cord, unspecified: Secondary | ICD-10-CM

## 2021-12-25 DIAGNOSIS — M5414 Radiculopathy, thoracic region: Secondary | ICD-10-CM

## 2021-12-25 DIAGNOSIS — E11649 Type 2 diabetes mellitus with hypoglycemia without coma: Secondary | ICD-10-CM | POA: Insufficient documentation

## 2021-12-25 DIAGNOSIS — M542 Cervicalgia: Secondary | ICD-10-CM | POA: Diagnosis not present

## 2021-12-25 MED ORDER — HYDROCODONE-ACETAMINOPHEN 10-325 MG PO TABS: 1.0000 | ORAL_TABLET | Freq: Three times a day (TID) | ORAL | 0 refills | Status: DC | PRN

## 2021-12-25 NOTE — Progress Notes (Signed)

## 2021-12-25 NOTE — Telephone Encounter (Signed)
They do not have Hydrocodone 10/325 in stock. They do have 5/325. Is it safe re: Acetaminophen to take two 5/325 instead? Pt does not want to go to another pharmacy because this one uses bubble packs. Will you prescribe this? Should I send to you a refill request?

## 2021-12-25 NOTE — Telephone Encounter (Signed)
Pt called back, they have the Hydrocodone 10/325 in stock.. Rx request sent to Dr. Holley Raring.

## 2021-12-25 NOTE — Telephone Encounter (Signed)
Caryl Pina from pharmacy will like for someone to give her an call. About patient prescription to see if prescription can be changed due to inventory issues. Please give pharmacy a call. Thanks

## 2021-12-25 NOTE — Telephone Encounter (Signed)
Attempted to call pharmacy, they are not yet open for the day.

## 2021-12-25 NOTE — Telephone Encounter (Signed)
I attempted to call patient, message left.

## 2021-12-26 ENCOUNTER — Ambulatory Visit: Payer: Medicare Other | Admitting: Physical Therapy

## 2022-01-03 ENCOUNTER — Ambulatory Visit: Payer: Medicare Other | Admitting: Physical Therapy

## 2022-01-04 ENCOUNTER — Ambulatory Visit: Payer: Medicare Other | Attending: Neurology

## 2022-01-04 DIAGNOSIS — R2689 Other abnormalities of gait and mobility: Secondary | ICD-10-CM | POA: Diagnosis present

## 2022-01-04 DIAGNOSIS — M542 Cervicalgia: Secondary | ICD-10-CM | POA: Insufficient documentation

## 2022-01-04 DIAGNOSIS — R262 Difficulty in walking, not elsewhere classified: Secondary | ICD-10-CM | POA: Diagnosis present

## 2022-01-04 DIAGNOSIS — M792 Neuralgia and neuritis, unspecified: Secondary | ICD-10-CM | POA: Diagnosis present

## 2022-01-04 DIAGNOSIS — M5382 Other specified dorsopathies, cervical region: Secondary | ICD-10-CM | POA: Insufficient documentation

## 2022-01-04 DIAGNOSIS — M25611 Stiffness of right shoulder, not elsewhere classified: Secondary | ICD-10-CM | POA: Diagnosis present

## 2022-01-04 DIAGNOSIS — M6281 Muscle weakness (generalized): Secondary | ICD-10-CM | POA: Insufficient documentation

## 2022-01-04 DIAGNOSIS — R269 Unspecified abnormalities of gait and mobility: Secondary | ICD-10-CM | POA: Diagnosis present

## 2022-01-04 DIAGNOSIS — Z7409 Other reduced mobility: Secondary | ICD-10-CM | POA: Diagnosis present

## 2022-01-04 DIAGNOSIS — M25612 Stiffness of left shoulder, not elsewhere classified: Secondary | ICD-10-CM | POA: Diagnosis present

## 2022-01-04 NOTE — Therapy (Signed)
OUTPATIENT PHYSICAL THERAPY THORACOLUMBAR TREATMENT   Patient Name: Thomas Cole MRN: [id number] DOB:[birth year], 51 y.o., male Today's Date: 01/05/2022   PT End of Session - 01/04/22 1524     Visit Number 7    Number of Visits 31    Date for PT Re-Evaluation 03/29/22    Authorization Type BCBS Medicare    Progress Note Due on Visit 10    PT Start Time 1519    PT Stop Time 1602    PT Time Calculation (min) 43 min    Activity Tolerance Patient tolerated treatment well    Behavior During Therapy [facility] for tasks assessed/performed               Past Medical History:  Diagnosis Date   Anxiety    Asthma    Back problem    Benign essential hypertension 11/30/2016   Cervical spondylosis without myelopathy 1/69/6789   Complication of anesthesia    occ takes longer to wake   Depression    Diabetes mellitus without complication ([place]) 38/1017   Type 2   GERD (gastroesophageal reflux disease)    Hypertension    Labile hypertension 03/19/2013   No current neuro changes, headache much improved. No clear sign of intracranial bleed. NO indication for head CT>  BP much better now... Fluctuates a lot.  Will eval for pheochromocytoma. As recommended at last cardiology OV.. Will prescribe hydralazine to use as needed for BP fluctuations.  Close follow up with PCP.    Major depressive disorder, recurrent episode, severe ([place]) 06/23/2015   Migraine headache    Migraines 11/11/2013   S/P appendectomy 04/09/2011   Seizure ([place]) 11/30/2016   Seizures ([place])    last 87   Shortness of breath    SOB (shortness of breath) 08/06/2014   - [facility] eval with cpst 02/20/2008 exercise testing demonstrateed normal functional capabilities and a normal cardiopulmonary response to exercise. - 09/16/2014  Walked RA x 3 laps @ 185 ft each stopped due to end of study/ nl pace/ no desat  - spirometry 09/16/2014 > no obst/ min restriction      Past Surgical History:  Procedure Laterality Date   ANTERIOR CERVICAL  CORPECTOMY N/A 09/15/2012   Procedure: Cervical Three-Four,Cervical Six-Seven Anterior cervical decompression/diskectomy fusion with Cervical Four Corpectomy;  Surgeon: Kristeen Miss, MD;  Location: [facility];  Service: Neurosurgery;  Laterality: N/A;  Cervical Three-Four,Cervical Six-Seven  Anterior cervical decompression/diskectomy fusion with Cervical four Corpectomy   APPENDECTOMY  12   CERVICAL DISC SURGERY     CHOLECYSTECTOMY N/A 03/29/2017   Procedure: LAPAROSCOPIC CHOLECYSTECTOMY;  Surgeon: Florene Glen, MD;  Location: [facility];  Service: General;  Laterality: N/A;   COLONOSCOPY WITH PROPOFOL N/A 10/23/2019   Procedure: COLONOSCOPY WITH PROPOFOL;  Surgeon: Robert Bellow, MD;  Location: [facility];  Service: Endoscopy;  Laterality: N/A;   ESOPHAGOGASTRODUODENOSCOPY (EGD) WITH PROPOFOL N/A 10/23/2019   Procedure: ESOPHAGOGASTRODUODENOSCOPY (EGD) WITH PROPOFOL;  Surgeon: Robert Bellow, MD;  Location: [facility] END[facility]COPY;  Service: Endoscopy;  Laterality: N/A;   KNEE SURGERY     bilateral   POSTERIOR FUSION CERVICAL SPINE     Patient Active Problem List   Diagnosis Date Noted   Spinal stenosis, lumbar region, with neurogenic claudication 07/04/2021   Chronic radicular lumbar pain 07/04/2021   Neuroforaminal stenosis of lumbar spine (L3,4,5) 07/04/2021   Thoracic facet joint syndrome 09/24/2019   Chronic pain syndrome 07/14/2019   Failed cervical fusion 11/20/2018   Episode of recurrent major  depressive disorder ([place]) 11/20/2018   Myelopathy of cervical spinal cord with cervical radiculopathy ([facility]) 05/13/2018   Bilateral occipital neuralgia 02/11/2018   Degeneration of cervical intervertebral disc with myelopathy 06/13/2017   H/O cervical spinal arthrodesis 05/16/2017   Thoracic radiculopathy 05/16/2017   Acute cholecystitis 03/29/2017   Anxiety 11/30/2016   Benign essential hypertension 11/30/2016   Seizure ([place]) 11/30/2016   Localization-related symptomatic epilepsy  and epileptic syndromes with complex partial seizures, not intractable, without status epilepticus ([place]) 02/21/2016   Generalized anxiety disorder 06/23/2015   Major depressive disorder, recurrent episode, severe ([place]) 06/23/2015   PTSD (post-traumatic stress disorder) 06/16/2015   SOB (shortness of breath) 08/06/2014   Obesity (BMI 30-39.9) 11/11/2013   Migraines 11/11/2013   Asthma, mild intermittent 11/11/2013   History of seizures 11/11/2013   GERD without esophagitis 11/11/2013   Depression 11/11/2013   Labile hypertension 03/19/2013   Spondylosis, cervical, with myelopathy 09/17/2012   S/P appendectomy 04/09/2011   Sleep apnea 05/25/2010    PCP: Derinda Late, MD  REFERRING PROVIDER: Vladimir Crofts, MD  REFERRING DIAG: Parkinson's Disease  Rationale for Evaluation and Treatment Rehabilitation  THERAPY DIAG:  Cervicalgia  Neuralgia and neuritis  Impaired range of motion of cervical spine  Impaired range of motion of both shoulders  Impaired functional mobility, balance, gait, and endurance  ONSET DATE: 2013-2014  SUBJECTIVE:                                                                                                                                                                                           SUBJECTIVE STATEMENT: Pt reports ongoing neck pain and feels like the PT has helped with his mobility but states sometimes his pain worsens with increased mobility. Patient reports more difficulty these days with functional mobility- difficulty walking and legs feeling weak. Reports difficulty with steps and very unsure of his mobility.   PERTINENT HISTORY:  Pt with extensive medical history concerning cervical, thoracic, and lumbar spine diagnoses including: thoracic radiculopathy, myelopathy of cervical spine cord, chronic pain syndrome, thoracic facet joint syndrome, hx lumbar laminectomy (1994), hx cervical fusion x2 (C3-4 ~74yr ago, C3-7 ~7-839yrago). Per pt,  his MD has concern for myelomalacia of lower spinal cord. Pt with recent hospital admission in January of this year due to acute/chronic low back pain. Pt notes episode of bowel/bladder incontinence, but denies saddle anesthesia.  Pt arrives to OPPT with c/o cervical and low back pain. Pt notes that this has been an ongoing problem for years, and it has been affecting his Independence at home, as well as his quality of life. Per pt, the MD said "he's  one bad fall away from being done" as he states his MD has concern for myelomalacia. Pt notes that symptoms are worse on the L arm and L leg. Pt enjoys being outside and working on cars. His significant other states he does daily walks down the driveway.  PAIN:  Are you having pain? Yes: NPRS scale: 7-8/10 Pain location: neck, back, lats   PRECAUTIONS: Fall  WEIGHT BEARING RESTRICTIONS No  FALLS:  Has patient fallen in last 6 months? Yes. Number of falls 1;  sitting on rollator without breaks locked; lost balance and fell off rollator  LIVING ENVIRONMENT: Lives with: lives alone Lives in: House/apartment Stairs: Yes: External: 3-4 steps; on left going up Has following equipment at home: Single point cane, Environmental consultant - 4 wheeled, Wheelchair (power), Wheelchair (manual), shower chair, and Grab bars. Lift chair recliner.  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 Severe spinal canal stenosis at the L2-L5 levels due to combination of disc bulges and congenitally narrow spinal canal. 2. Moderate right and severe left neural foraminal stenosis at L4-5 and moderate left neural foraminal stenosis at L5-S1. 3. No thoracic spinal canal or neural foraminal stenosis.   PATIENT SURVEYS:  FOTO  35%  COGNITION:  Overall cognitive status: Within functional limits for tasks assessed     SENSATION: Light touch: diminished sensation LLE compared to RLE Touch discrimination: intact bil   COORDINATION:   Opposition: intact (R); bradykinetic (L) Finger to Nose: bradykinetic and undershooting (R); bradykinetic and undershooting (L)   Alternating Toe Tap: intact   Heel to Shin: intact bil, limited hip flexion ROM  CERVICAL ROM: (hx multiple cervical fusions)  Active  AROM  eval  Flexion 20deg  Extension 4deg  Right lateral flexion 10deg  Left lateral flexion 15deg  Right rotation 20deg  Left rotation 15deg, p!   (Blank rows = not tested)  UPPER EXTREMITY ROM:    Active  Right eval Left eval  Shoulder flexion 110 110    LOWER EXTREMITY ROM:    Limited seated hip flexion AROM (not associated with strength) HS length equal/WFL bil, no c/o neural tension Functionally observed limited hip ext bil  LOWER EXTREMITY MMT:    MMT Right eval Left eval  Hip flexion 4 4+  Knee flexion 4- 4  Knee extension 4 4+   (Blank rows = not tested)   FUNCTIONAL TESTS:  11/20/21:    5xSTS: 44.44 sec without UE support  10 m gait speed self-selected pace:    With SPC: 0.27 m/s   Without AD: .28 m/s  GAIT: Distance walked: 5f x2 Assistive device utilized: Single point cane Level of assistance: SBA Comments: limited hip ext bil, decreased R arm swing, SPC in L hand, decreased foot clearance bil, decreased step length, guarded movement    TODAY'S TREATMENT 01/05/22       Discussed at length patient condition and progression. He agreed to shift focus of care more toward functional training as he reports he feels his neck is more of a chronic issue and would like to focus more on mobility/safety.  Added goals to reflect below functional testing- impairments with LE strength and immobility.   TUG: 43.25 sec without AD 10MWT= 33.45 sec (0.30 m/s)  without AD and 32.01 sec  (  0.312 m/s)  with SPC 5 x STS = 50.07 sec without AD *will need to add balance test next visit and add goal as appropriate.   PATIENT EDUCATION:  Education details: HEP updates, exercises, Progress in AROM of cervical spine Person educated: Patient and girlfriend Education method: Explanation, Demonstration, Tactile cues, Verbal cues, and Handouts Education comprehension: verbalized understanding, verbal cues required, tactile cues required, and needs further education   HOME EXERCISE PROGRAM: Updated 11/20/21 Access Code: [email or web address] URL: [email or web address] Date: 11/20/2021 Prepared by: Larna Daughters  Exercises - Seated Gentle Upper Trapezius Stretch  - 1 x daily - 7 x weekly - 2 sets - 8 reps - 20 hold - Seated Levator Scapulae Stretch  - 1 x daily - 7 x weekly - 2 sets - 8 reps - 20 hold - Seated Scalenes Stretch  - 1 x daily - 7 x weekly - 2 sets - 8 reps - 20 hold - Doorway Pec Stretch at 60 Elevation  - 1 x daily - 7 x weekly - 2 sets - 8 reps - 20 hold - Median Nerve Glide  - 1 x daily - 7 x weekly - 2 sets - 10 reps - Supine Double Knee to Chest  - 1 x daily - 7 x weekly - 2 sets - 12 reps    Access Code: [email or web address] URL: [email or web address] Date: 11/13/2021 Prepared by: Amalia Hailey  Exercises - Seated Gentle Upper Trapezius Stretch  - 1 x daily - 7 x weekly - 2 sets - 8 reps - 20 hold - Seated Levator Scapulae Stretch  - 1 x daily - 7 x weekly - 2 sets - 8 reps - 20 hold - Seated Scalenes Stretch  - 1 x daily - 7 x weekly - 2 sets - 8 reps - 20 hold - Doorway Pec Stretch at 60 Elevation  - 1 x daily - 7 x weekly - 2 sets - 8 reps - 20 hold  ASSESSMENT:  CLINICAL IMPRESSION: Pt presents with good motivation for today's recert visit. He reports ongoing neck/cervical issues yet requested to shift focus on more Parkinson's symptoms including LE weakness, difficulty walking and balance. Patient was tested and presented with increased LE muscle  weakness as seen in 5x Sit to stand test, slow gait and increased risk of falling. Added goals to reflect updated focus of care. Patient will continue to benefit from continued PT services to address his pain, ROM, LE muscle weakness, immobility and imbalance for improved quality of life and decreased risk of falling.   OBJECTIVE IMPAIRMENTS Abnormal gait, cardiopulmonary status limiting activity, decreased activity tolerance, decreased balance, decreased coordination, decreased endurance, decreased mobility, difficulty walking, decreased ROM, decreased strength, increased fascial restrictions, impaired perceived functional ability, increased muscle spasms, impaired flexibility, impaired sensation, impaired UE functional use, improper body mechanics, obesity, and pain.  ACTIVITY LIMITATIONS carrying, lifting, bending, standing, squatting, sleeping, stairs, transfers, bed mobility, continence, dressing, reach over head, hygiene/grooming, and locomotion level  PARTICIPATION LIMITATIONS: meal prep, cleaning, laundry, shopping, community activity, occupation, yard work, and lifting/exercise  PERSONAL FACTORS Time since onset of injury/illness/exacerbation and 3+ comorbidities: T2DM, HTN, Seizures  are also affecting patient's functional outcome.   REHAB POTENTIAL: Fair secondary to duration of symptoms and complicated MSK diagnosis hx  CLINICAL DECISION MAKING: Stable/uncomplicated  EVALUATION COMPLEXITY: Moderate   GOALS: Goals reviewed with patient? Yes  SHORT TERM GOALS: Target date: 02/02/2022  Pt will be Ind and compliant with HEP, to improve overall self-management of symptoms outside of therapy.  Baseline: HEP provided; 01/04/2022- Patient reports trying to perform stretching as able and as pain allows.  Goal status: ONGOING   LONG TERM GOALS: Target date: 03/29/2022  Pt will report 2-3/10 pain at it's worst, to improve overall tolerance for upright functional mobility, and to improve  overall quality of life. Baseline: 6-7/10; 01/04/2022= 7/10 -achy with worsensing with certain movement  Goal status: ONGOING  Pt will improve FOTO to target score to 44%, to demonstrate clinically significant improvement in perceived functional mobility. Baseline: eval= 35%; 01/04/2022- 40% Goal status: Ongoing  Pt will be asymptomatic with ULTT indicating improved muscle/nerve tension, for improved tolerance and ability to perform ADL's and IADL's. Baseline: median (+) with elbow extension; 01/04/2022- (+) on left; (-) on right Goal status: ONGOING  4.  Pt will increase shoulder AROM by 30deg grossly, to improve tolerance and independence with ADL's, and IADL's. Baseline: shoulder flexion 110deg; Left = 132 deg; Right = 128 deg Goal status: ONGOING  5. Pt will decrease 5TSTS by at least 8 seconds in order to demonstrate clinically significant improvement in LE strength.    Baseline: 01/04/2022= 50.07 sec without AD    Goal status: INITIAL    6. Pt will decrease TUG to below 20 seconds in order to demonstrate decreased fall risk    Baseline: 01/04/2022= 43.25 without an AD    7. Pt will increase 10MWT by at least 0.13 m/s in order to demonstrate clinically significant improvement in community ambulation.      Baseline: 01/04/2022=  33.45 sec (0.30 m/s)  without AD and 32.01 sec (0.312 m/s)  with SPC PLAN: PT FREQUENCY: 2x/week  PT DURATION: 12 weeks  PLANNED INTERVENTIONS: Therapeutic exercises, Therapeutic activity, Neuromuscular re-education, Balance training, Gait training, Patient/Family education, Self Care, Joint mobilization, Stair training, DME instructions, Aquatic Therapy, Dry Needling, Electrical stimulation, Spinal mobilization, Cryotherapy, Moist heat, scar mobilization, Traction, Ultrasound, Manual therapy, and Re-evaluation.  PLAN FOR NEXT SESSION:  perform appropriate balance test and add goal as appropriate. Progress LE strength and functional mobility interventions-  progressive balance/therex/gait training as appropriate.     Ollen Bowl, PT Physical Therapist- [facility]  8:17 AM,01/05/22

## 2022-01-05 ENCOUNTER — Ambulatory Visit: Payer: Medicare Other

## 2022-01-08 ENCOUNTER — Ambulatory Visit: Payer: Medicare Other | Admitting: Physical Therapy

## 2022-01-08 ENCOUNTER — Encounter: Payer: Medicare Other | Attending: Family Medicine

## 2022-01-08 DIAGNOSIS — E11649 Type 2 diabetes mellitus with hypoglycemia without coma: Secondary | ICD-10-CM | POA: Insufficient documentation

## 2022-01-10 ENCOUNTER — Encounter: Payer: Self-pay | Admitting: Physical Therapy

## 2022-01-10 ENCOUNTER — Ambulatory Visit: Payer: Medicare Other | Admitting: Physical Therapy

## 2022-01-10 DIAGNOSIS — M542 Cervicalgia: Secondary | ICD-10-CM | POA: Diagnosis not present

## 2022-01-10 DIAGNOSIS — R269 Unspecified abnormalities of gait and mobility: Secondary | ICD-10-CM

## 2022-01-10 DIAGNOSIS — R2689 Other abnormalities of gait and mobility: Secondary | ICD-10-CM

## 2022-01-10 DIAGNOSIS — Z7409 Other reduced mobility: Secondary | ICD-10-CM

## 2022-01-10 DIAGNOSIS — R262 Difficulty in walking, not elsewhere classified: Secondary | ICD-10-CM

## 2022-01-10 DIAGNOSIS — M6281 Muscle weakness (generalized): Secondary | ICD-10-CM

## 2022-01-10 NOTE — Therapy (Signed)
OUTPATIENT PHYSICAL THERAPY NEURO TREATMENT   Patient Name: Thomas Cole MRN: [id number] DOB:03/16/1971, 51 y.o., male Today's Date: 01/10/2022   PT End of Session - 01/10/22 1150     Visit Number 8    Number of Visits 31    Date for PT Re-Evaluation 03/29/22    Authorization Type BCBS Medicare    Progress Note Due on Visit 10    PT Start Time 1147    PT Stop Time 1229    PT Time Calculation (min) 42 min    Equipment Utilized During Treatment Gait belt    Activity Tolerance Patient tolerated treatment well    Behavior During Therapy WFL for tasks assessed/performed                Past Medical History:  Diagnosis Date   Anxiety    Asthma    Back problem    Benign essential hypertension 11/30/2016   Cervical spondylosis without myelopathy 1/60/1093   Complication of anesthesia    occ takes longer to wake   Depression    Diabetes mellitus without complication ([place]) 23/5573   Type 2   GERD (gastroesophageal reflux disease)    Hypertension    Labile hypertension 03/19/2013   No current neuro changes, headache much improved. No clear sign of intracranial bleed. NO indication for head CT>  BP much better now... Fluctuates a lot.  Will eval for pheochromocytoma. As recommended at last cardiology OV.. Will prescribe hydralazine to use as needed for BP fluctuations.  Close follow up with PCP.    Major depressive disorder, recurrent episode, severe ([place]) 06/23/2015   Migraine headache    Migraines 11/11/2013   S/P appendectomy 04/09/2011   Seizure ([place]) 11/30/2016   Seizures ([place])    last 87   Shortness of breath    SOB (shortness of breath) 08/06/2014   - [facility] eval with cpst 02/20/2008 exercise testing demonstrateed normal functional capabilities and a normal cardiopulmonary response to exercise. - 09/16/2014  Walked RA x 3 laps @ 185 ft each stopped due to end of study/ nl pace/ no desat  - spirometry 09/16/2014 > no obst/ min restriction      Past Surgical History:   Procedure Laterality Date   ANTERIOR CERVICAL CORPECTOMY N/A 09/15/2012   Procedure: Cervical Three-Four,Cervical Six-Seven Anterior cervical decompression/diskectomy fusion with Cervical Four Corpectomy;  Surgeon: Kristeen Miss, MD;  Location: [facility];  Service: Neurosurgery;  Laterality: N/A;  Cervical Three-Four,Cervical Six-Seven  Anterior cervical decompression/diskectomy fusion with Cervical four Corpectomy   APPENDECTOMY  12   CERVICAL DISC SURGERY     CHOLECYSTECTOMY N/A 03/29/2017   Procedure: LAPAROSCOPIC CHOLECYSTECTOMY;  Surgeon: Florene Glen, MD;  Location: [facility];  Service: General;  Laterality: N/A;   COLONOSCOPY WITH PROPOFOL N/A 10/23/2019   Procedure: COLONOSCOPY WITH PROPOFOL;  Surgeon: Robert Bellow, MD;  Location: [facility];  Service: Endoscopy;  Laterality: N/A;   ESOPHAGOGASTRODUODENOSCOPY (EGD) WITH PROPOFOL N/A 10/23/2019   Procedure: ESOPHAGOGASTRODUODENOSCOPY (EGD) WITH PROPOFOL;  Surgeon: Robert Bellow, MD;  Location: [facility];  Service: Endoscopy;  Laterality: N/A;   KNEE SURGERY     bilateral   POSTERIOR FUSION CERVICAL SPINE     Patient Active Problem List   Diagnosis Date Noted   Spinal stenosis, lumbar region, with neurogenic claudication 07/04/2021   Chronic radicular lumbar pain 07/04/2021   Neuroforaminal stenosis of lumbar spine (L3,4,5) 07/04/2021   Thoracic facet joint syndrome 09/24/2019   Chronic pain syndrome 07/14/2019  Failed cervical fusion 11/20/2018   Episode of recurrent major depressive disorder ([place]) 11/20/2018   Myelopathy of cervical spinal cord with cervical radiculopathy ([facility]) 05/13/2018   Bilateral occipital neuralgia 02/11/2018   Degeneration of cervical intervertebral disc with myelopathy 06/13/2017   H/O cervical spinal arthrodesis 05/16/2017   Thoracic radiculopathy 05/16/2017   Acute cholecystitis 03/29/2017   Anxiety 11/30/2016   Benign essential hypertension 11/30/2016   Seizure ([place])  11/30/2016   Localization-related symptomatic epilepsy and epileptic syndromes with complex partial seizures, not intractable, without status epilepticus ([place]) 02/21/2016   Generalized anxiety disorder 06/23/2015   Major depressive disorder, recurrent episode, severe ([place]) 06/23/2015   PTSD (post-traumatic stress disorder) 06/16/2015   SOB (shortness of breath) 08/06/2014   Obesity (BMI 30-39.9) 11/11/2013   Migraines 11/11/2013   Asthma, mild intermittent 11/11/2013   History of seizures 11/11/2013   GERD without esophagitis 11/11/2013   Depression 11/11/2013   Labile hypertension 03/19/2013   Spondylosis, cervical, with myelopathy 09/17/2012   S/P appendectomy 04/09/2011   Sleep apnea 05/25/2010    PCP: Derinda Late, MD  REFERRING PROVIDER: Vladimir Crofts, MD  REFERRING DIAG: Parkinson's Disease  Rationale for Evaluation and Treatment Rehabilitation  THERAPY DIAG:  Abnormality of gait and mobility  Difficulty in walking, not elsewhere classified  Other abnormalities of gait and mobility  Muscle weakness (generalized)  Impaired functional mobility, balance, gait, and endurance  ONSET DATE: 2013-2014  SUBJECTIVE:                                                                                                                                                                                           SUBJECTIVE STATEMENT: Pt reports having some concerns with the valet parking asking him "if PT wants him to use wheelchair" Pt used to work out heavy at gym until injury. Pt wants to get back to maintaining what is there because he knows he lost a lot in the last three years.   PERTINENT HISTORY:  Pt with extensive medical history concerning cervical, thoracic, and lumbar spine diagnoses including: thoracic radiculopathy, myelopathy of cervical spine cord, chronic pain syndrome, thoracic facet joint syndrome, hx lumbar laminectomy (1994), hx cervical fusion x2 (C3-4 ~47yr ago,  C3-7 ~7-889yrago). Per pt, his MD has concern for myelomalacia of lower spinal cord. Pt with recent hospital admission in January of this year due to acute/chronic low back pain. Pt notes episode of bowel/bladder incontinence, but denies saddle anesthesia.  Pt arrives to OPPT with c/o cervical and low back pain. Pt notes that this has been an ongoing problem for years, and it has been affecting his Independence at home,  as well as his quality of life. Per pt, the MD said "he's one bad fall away from being done" as he states his MD has concern for myelomalacia. Pt notes that symptoms are worse on the L arm and L leg. Pt enjoys being outside and working on cars. His significant other states he does daily walks down the driveway.  PAIN:  Are you having pain? Yes: NPRS scale: 7-8/10 Pain location: neck, back, lats   PRECAUTIONS: Fall  WEIGHT BEARING RESTRICTIONS No  FALLS:  Has patient fallen in last 6 months? Yes. Number of falls 1;  sitting on rollator without breaks locked; lost balance and fell off rollator  LIVING ENVIRONMENT: Lives with: lives alone Lives in: House/apartment Stairs: Yes: External: 3-4 steps; on left going up Has following equipment at home: Single point cane, Environmental consultant - 4 wheeled, Wheelchair (power), Wheelchair (manual), shower chair, and Grab bars. Lift chair recliner.  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 Severe spinal canal stenosis at the L2-L5 levels due to combination of disc bulges and congenitally narrow spinal canal. 2. Moderate right and severe left neural foraminal stenosis at L4-5 and moderate left neural foraminal stenosis at L5-S1. 3. No thoracic spinal canal or neural foraminal  stenosis.   PATIENT SURVEYS:  FOTO 35%  COGNITION:  Overall cognitive status: Within functional limits for tasks assessed     SENSATION: Light touch: diminished sensation LLE compared to RLE Touch discrimination: intact bil   COORDINATION:   Opposition: intact (R); bradykinetic (L) Finger to Nose: bradykinetic and undershooting (R); bradykinetic and undershooting (L)   Alternating Toe Tap: intact   Heel to Shin: intact bil, limited hip flexion ROM  CERVICAL ROM: (hx multiple cervical fusions)  Active  AROM  eval  Flexion 20deg  Extension 4deg  Right lateral flexion 10deg  Left lateral flexion 15deg  Right rotation 20deg  Left rotation 15deg, p!   (Blank rows = not tested)  UPPER EXTREMITY ROM:    Active  Right eval Left eval  Shoulder flexion 110 110    LOWER EXTREMITY ROM:    Limited seated hip flexion AROM (not associated with strength) HS length equal/WFL bil, no c/o neural tension Functionally observed limited hip ext bil  LOWER EXTREMITY MMT:    MMT Right eval Left eval  Hip flexion 4 4+  Knee flexion 4- 4  Knee extension 4 4+   (Blank rows = not tested)   FUNCTIONAL TESTS:  11/20/21:    5xSTS: 44.44 sec without UE support  10 m gait speed self-selected pace:    With SPC: 0.27 m/s   Without AD: .28 m/s  GAIT: Distance walked: 78f x2 Assistive device utilized: Single point cane Level of assistance: SBA Comments: limited hip ext bil, decreased R arm swing, SPC in L hand, decreased foot clearance bil, decreased step length, guarded movement    TODAY'S TREATMENT 01/10/22 Initiated the following LE strengthening exercises as updated strength and mobility HEP.    Access Code: [email or web address]: [email or web address] Date: 01/10/2022 Prepared by: CRivka Barbara Exercises - Standing March with Counter Support  - 2 x daily - 7 x weekly - 1 sets - 10 reps - Side Stepping with Counter Support  - 2 x daily - 7 x weekly -  1 sets - 10  reps - Heel Toe Raises with Counter Support  - 2 x daily - 7 x weekly - 1 sets - 15 reps - Standing Hip Extension with Counter Support  - 1 x daily - 7 x weekly - 1 sets - 10 reps - Standing Alternating Knee Flexion  - 2 x daily - 7 x weekly - 3 sets - 10 reps - Sit to Stand with Armchair  - 2 x daily - 7 x weekly - 2 sets - 6 reps - Seated Long Arc Quad  - 1 x daily - 7 x weekly - 2 sets - 10 reps      [facility] PT Assessment - 01/10/22 0001       Standardized Balance Assessment   Standardized Balance Assessment Berg Balance Test (P)       Berg Balance Test   Sit to Stand Able to stand  independently using hands (P)     Standing Unsupported Able to stand safely 2 minutes (P)     Sitting with Back Unsupported but Feet Supported on Floor or Stool Able to sit safely and securely 2 minutes (P)     Stand to Sit Controls descent by using hands (P)     Transfers Able to transfer safely, definite need of hands (P)     Standing Unsupported with Eyes Closed Able to stand 10 seconds with supervision (P)     Standing Unsupported with Feet Together Able to place feet together independently and stand for 1 minute with supervision (P)     From Standing, Reach Forward with Outstretched Arm Can reach forward >5 cm safely (2") (P)     From Standing Position, Pick up Object from Floor Able to pick up shoe, needs supervision (P)     From Standing Position, Turn to Look Behind Over each Shoulder Looks behind one side only/other side shows less weight shift (P)     Turn 360 Degrees Able to turn 360 degrees safely but slowly (P)     Standing Unsupported, Alternately Place Feet on Step/Stool Able to stand independently and complete 8 steps >20 seconds (P)     Standing Unsupported, One Foot in Front Able to place foot tandem independently and hold 30 seconds (P)     Standing on One Leg Tries to lift leg/unable to hold 3 seconds but remains standing independently (P)     Total Score 41 (P)             PATIENT  EDUCATION:  Education details: HEP for balance and strength  Person educated: Patient and girlfriend Education method: Explanation, Demonstration, Tactile cues, Verbal cues, and Handouts Education comprehension: verbalized understanding, verbal cues required, tactile cues required, and needs further education   HOME EXERCISE PROGRAM:  Access Code: [email or web address] URL: [email or web address] Date: 01/10/2022 Prepared by: Rivka Barbara  Exercises - Standing March with Counter Support  - 2 x daily - 7 x weekly - 3 sets - 10 reps - Side Stepping with Counter Support  - 2 x daily - 7 x weekly - 3 sets - 10 reps - Heel Toe Raises with Counter Support  - 2 x daily - 7 x weekly - 1 sets - 15 reps - Standing Hip Extension with Counter Support  - 1 x daily - 7 x weekly - 3 sets - 10 reps - Standing Alternating Knee Flexion  - 2 x daily - 7 x weekly - 3 sets - 10 reps - Sit to Stand  with Armchair  - 2 x daily - 7 x weekly - 2 sets - 6 reps - Seated Long Arc Quad  - 1 x daily - 7 x weekly - 3 sets - 10 reps  Updated 11/20/21 Access Code: [email or web address] URL: [email or web address] Date: 11/20/2021 Prepared by: Larna Daughters  Exercises - Seated Gentle Upper Trapezius Stretch  - 1 x daily - 7 x weekly - 2 sets - 8 reps - 20 hold - Seated Levator Scapulae Stretch  - 1 x daily - 7 x weekly - 2 sets - 8 reps - 20 hold - Seated Scalenes Stretch  - 1 x daily - 7 x weekly - 2 sets - 8 reps - 20 hold - Doorway Pec Stretch at 60 Elevation  - 1 x daily - 7 x weekly - 2 sets - 8 reps - 20 hold - Median Nerve Glide  - 1 x daily - 7 x weekly - 2 sets - 10 reps - Supine Double Knee to Chest  - 1 x daily - 7 x weekly - 2 sets - 12 reps    Access Code: [email or web address] URL: [email or web address] Date: 11/13/2021 Prepared by: Amalia Hailey  Exercises - Seated Gentle Upper Trapezius Stretch  - 1 x daily - 7 x weekly - 2 sets - 8 reps - 20 hold - Seated Levator Scapulae Stretch  - 1  x daily - 7 x weekly - 2 sets - 8 reps - 20 hold - Seated Scalenes Stretch  - 1 x daily - 7 x weekly - 2 sets - 8 reps - 20 hold - Doorway Pec Stretch at 60 Elevation  - 1 x daily - 7 x weekly - 2 sets - 8 reps - 20 hold  ASSESSMENT:  CLINICAL IMPRESSION: Patient presents with good motivation for completion of physical therapy activities.  Physical therapist introduced patient to several new lower extremity strengthening exercises in order to improve his overall mobility and quality of life.  Patient responds well to exercise initiated in the session.  We will continue with lower extremity strengthening and balance interventions next session as patient tolerates.  Patient will continue to benefit with skilled physical therapy interventions in order to improve lower extremity strength, endurance, mobility, prevent falls, and improve quality of life.   OBJECTIVE IMPAIRMENTS Abnormal gait, cardiopulmonary status limiting activity, decreased activity tolerance, decreased balance, decreased coordination, decreased endurance, decreased mobility, difficulty walking, decreased ROM, decreased strength, increased fascial restrictions, impaired perceived functional ability, increased muscle spasms, impaired flexibility, impaired sensation, impaired UE functional use, improper body mechanics, obesity, and pain.  ACTIVITY LIMITATIONS carrying, lifting, bending, standing, squatting, sleeping, stairs, transfers, bed mobility, continence, dressing, reach over head, hygiene/grooming, and locomotion level  PARTICIPATION LIMITATIONS: meal prep, cleaning, laundry, shopping, community activity, occupation, yard work, and lifting/exercise  PERSONAL FACTORS Time since onset of injury/illness/exacerbation and 3+ comorbidities: T2DM, HTN, Seizures  are also affecting patient's functional outcome.   REHAB POTENTIAL: Fair secondary to duration of symptoms and complicated MSK diagnosis hx  CLINICAL DECISION MAKING:  Stable/uncomplicated  EVALUATION COMPLEXITY: Moderate   GOALS: Goals reviewed with patient? Yes  SHORT TERM GOALS: Target date: 02/07/2022  Pt will be Ind and compliant with HEP, to improve overall self-management of symptoms outside of therapy. Baseline: HEP provided; 01/04/2022- Patient reports trying to perform stretching as able and as pain allows.  Goal status: ONGOING   LONG TERM GOALS: Target date: 03/29/2022  Pt will report 2-3/10 pain at it's worst, to improve overall tolerance for upright  functional mobility, and to improve overall quality of life. Baseline: 6-7/10; 01/04/2022= 7/10 -achy with worsensing with certain movement  Goal status: ONGOING  Pt will improve FOTO to target score to 44%, to demonstrate clinically significant improvement in perceived functional mobility. Baseline: eval= 35%; 01/04/2022- 40% Goal status: Ongoing  Pt will be asymptomatic with ULTT indicating improved muscle/nerve tension, for improved tolerance and ability to perform ADL's and IADL's. Baseline: median (+) with elbow extension; 01/04/2022- (+) on left; (-) on right Goal status: ONGOING  4.  Pt will increase shoulder AROM by 30deg grossly, to improve tolerance and independence with ADL's, and IADL's. Baseline: shoulder flexion 110deg; Left = 132 deg; Right = 128 deg Goal status: ONGOING  5. Pt will decrease 5TSTS by at least 8 seconds in order to demonstrate clinically significant improvement in LE strength.    Baseline: 01/04/2022= 50.07 sec without AD    Goal status: INITIAL    6. Pt will decrease TUG to below 20 seconds in order to demonstrate decreased fall risk    Baseline: 01/04/2022= 43.25 without an AD    7. Pt will increase 10MWT by at least 0.13 m/s in order to demonstrate clinically significant improvement in community ambulation.      Baseline: 01/04/2022=  33.45 sec (0.30 m/s)  without AD and 32.01 sec (0.312 m/s)  with SPC  7. Pt will increase BERg balance scale to 47/56 or  greater in order to indicate improved LE balance and decreased risk of falls.      Baseline: 10/11: 41/56 PLAN: PT FREQUENCY: 2x/week  PT DURATION: 12 weeks  PLANNED INTERVENTIONS: Therapeutic exercises, Therapeutic activity, Neuromuscular re-education, Balance training, Gait training, Patient/Family education, Self Care, Joint mobilization, Stair training, DME instructions, Aquatic Therapy, Dry Needling, Electrical stimulation, Spinal mobilization, Cryotherapy, Moist heat, scar mobilization, Traction, Ultrasound, Manual therapy, and Re-evaluation.  PLAN FOR NEXT SESSION:  Progress LE strength and functional mobility interventions- progressive balance/therex/gait training as appropriate.    Particia Lather PT  1:00 PM,01/10/22

## 2022-01-11 ENCOUNTER — Telehealth: Payer: Self-pay | Admitting: *Deleted

## 2022-01-11 NOTE — Telephone Encounter (Signed)
Patient wasn't able to come to Diabetes classes 2 and 3. Phone call to follow up and he is not able to sit for long periods of time. We will see him for 1:1 visits to complete Diabetes program. He doesn't know his schedule and is waiting for his girlfriend's schedule so she can bring him. Will f/u on 01/22/22 to arrange the next visit.

## 2022-01-15 ENCOUNTER — Ambulatory Visit: Payer: Medicare Other | Admitting: Physical Therapy

## 2022-01-15 NOTE — Therapy (Deleted)
OUTPATIENT PHYSICAL THERAPY NEURO TREATMENT   Patient Name: Thomas Cole MRN: [id number] DOB:Mar 28, 1971, 51 y.o., male Today's Date: 01/15/2022        Past Medical History:  Diagnosis Date   Anxiety    Asthma    Back problem    Benign essential hypertension 11/30/2016   Cervical spondylosis without myelopathy 4/88/8916   Complication of anesthesia    occ takes longer to wake   Depression    Diabetes mellitus without complication ([place]) 94/5038   Type 2   GERD (gastroesophageal reflux disease)    Hypertension    Labile hypertension 03/19/2013   No current neuro changes, headache much improved. No clear sign of intracranial bleed. NO indication for head CT>  BP much better now... Fluctuates a lot.  Will eval for pheochromocytoma. As recommended at last cardiology OV.. Will prescribe hydralazine to use as needed for BP fluctuations.  Close follow up with PCP.    Major depressive disorder, recurrent episode, severe ([place]) 06/23/2015   Migraine headache    Migraines 11/11/2013   S/P appendectomy 04/09/2011   Seizure ([place]) 11/30/2016   Seizures ([place])    last 87   Shortness of breath    SOB (shortness of breath) 08/06/2014   - [facility] eval with cpst 02/20/2008 exercise testing demonstrateed normal functional capabilities and a normal cardiopulmonary response to exercise. - 09/16/2014  Walked RA x 3 laps @ 185 ft each stopped due to end of study/ nl pace/ no desat  - spirometry 09/16/2014 > no obst/ min restriction      Past Surgical History:  Procedure Laterality Date   ANTERIOR CERVICAL CORPECTOMY N/A 09/15/2012   Procedure: Cervical Three-Four,Cervical Six-Seven Anterior cervical decompression/diskectomy fusion with Cervical Four Corpectomy;  Surgeon: Kristeen Miss, MD;  Location: [facility];  Service: Neurosurgery;  Laterality: N/A;  Cervical Three-Four,Cervical Six-Seven  Anterior cervical decompression/diskectomy fusion with Cervical four Corpectomy   APPENDECTOMY  12   CERVICAL DISC  SURGERY     CHOLECYSTECTOMY N/A 03/29/2017   Procedure: LAPAROSCOPIC CHOLECYSTECTOMY;  Surgeon: Florene Glen, MD;  Location: [facility];  Service: General;  Laterality: N/A;   COLONOSCOPY WITH PROPOFOL N/A 10/23/2019   Procedure: COLONOSCOPY WITH PROPOFOL;  Surgeon: Robert Bellow, MD;  Location: [facility];  Service: Endoscopy;  Laterality: N/A;   ESOPHAGOGASTRODUODENOSCOPY (EGD) WITH PROPOFOL N/A 10/23/2019   Procedure: ESOPHAGOGASTRODUODENOSCOPY (EGD) WITH PROPOFOL;  Surgeon: Robert Bellow, MD;  Location: [facility];  Service: Endoscopy;  Laterality: N/A;   KNEE SURGERY     bilateral   POSTERIOR FUSION CERVICAL SPINE     Patient Active Problem List   Diagnosis Date Noted   Spinal stenosis, lumbar region, with neurogenic claudication 07/04/2021   Chronic radicular lumbar pain 07/04/2021   Neuroforaminal stenosis of lumbar spine (L3,4,5) 07/04/2021   Thoracic facet joint syndrome 09/24/2019   Chronic pain syndrome 07/14/2019   Failed cervical fusion 11/20/2018   Episode of recurrent major depressive disorder ([place]) 11/20/2018   Myelopathy of cervical spinal cord with cervical radiculopathy ([place]) 05/13/2018   Bilateral occipital neuralgia 02/11/2018   Degeneration of cervical intervertebral disc with myelopathy 06/13/2017   H/O cervical spinal arthrodesis 05/16/2017   Thoracic radiculopathy 05/16/2017   Acute cholecystitis 03/29/2017   Anxiety 11/30/2016   Benign essential hypertension 11/30/2016   Seizure ([place]) 11/30/2016   Localization-related symptomatic epilepsy and epileptic syndromes with complex partial seizures, not intractable, without status epilepticus ([place]) 02/21/2016   Generalized anxiety disorder 06/23/2015   Major depressive disorder, recurrent episode,  severe ([place]) 06/23/2015   PTSD (post-traumatic stress disorder) 06/16/2015   SOB (shortness of breath) 08/06/2014   Obesity (BMI 30-39.9) 11/11/2013   Migraines 11/11/2013   Asthma, mild intermittent  11/11/2013   History of seizures 11/11/2013   GERD without esophagitis 11/11/2013   Depression 11/11/2013   Labile hypertension 03/19/2013   Spondylosis, cervical, with myelopathy 09/17/2012   S/P appendectomy 04/09/2011   Sleep apnea 05/25/2010    PCP: Derinda Late, MD  REFERRING PROVIDER: Vladimir Crofts, MD  REFERRING DIAG: Parkinson's Disease  Rationale for Evaluation and Treatment Rehabilitation  THERAPY DIAG:  Abnormality of gait and mobility  Difficulty in walking, not elsewhere classified  Other abnormalities of gait and mobility  Muscle weakness (generalized)  ONSET DATE: 2013-2014  SUBJECTIVE:                                                                                                                                                                                           SUBJECTIVE STATEMENT: Pt reports having some concerns with the valet parking asking him "if PT wants him to use wheelchair" Pt used to work out heavy at gym until injury. Pt wants to get back to maintaining what is there because he knows he lost a lot in the last three years.   PERTINENT HISTORY:  Pt with extensive medical history concerning cervical, thoracic, and lumbar spine diagnoses including: thoracic radiculopathy, myelopathy of cervical spine cord, chronic pain syndrome, thoracic facet joint syndrome, hx lumbar laminectomy (1994), hx cervical fusion x2 (C3-4 ~35yr ago, C3-7 ~7-81yrago). Per pt, his MD has concern for myelomalacia of lower spinal cord. Pt with recent hospital admission in January of this year due to acute/chronic low back pain. Pt notes episode of bowel/bladder incontinence, but denies saddle anesthesia.  Pt arrives to OPPT with c/o cervical and low back pain. Pt notes that this has been an ongoing problem for years, and it has been affecting his Independence at home, as well as his quality of life. Per pt, the MD said "he's one bad fall away from being done" as he states  his MD has concern for myelomalacia. Pt notes that symptoms are worse on the L arm and L leg. Pt enjoys being outside and working on cars. His significant other states he does daily walks down the driveway.  PAIN:  Are you having pain? Yes: NPRS scale: 7-8/10 Pain location: neck, back, lats   PRECAUTIONS: Fall  WEIGHT BEARING RESTRICTIONS No  FALLS:  Has patient fallen in last 6 months? Yes. Number of falls 1;  sitting on rollator without breaks locked; lost balance and fell off rollator  LIVING ENVIRONMENT: Lives with: lives alone Lives in: House/apartment Stairs: Yes: External: 3-4 steps; on left going up Has following equipment at home: Single point cane, Environmental consultant - 4 wheeled, Wheelchair (power), Wheelchair (manual), shower chair, and Grab bars. Lift chair recliner.  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 Severe spinal canal stenosis at the L2-L5 levels due to combination of disc bulges and congenitally narrow spinal canal. 2. Moderate right and severe left neural foraminal stenosis at L4-5 and moderate left neural foraminal stenosis at L5-S1. 3. No thoracic spinal canal or neural foraminal stenosis.   PATIENT SURVEYS:  FOTO 35%  COGNITION:  Overall cognitive status: Within functional limits for tasks assessed     SENSATION: Light touch: diminished sensation LLE compared to RLE Touch discrimination: intact bil   COORDINATION:   Opposition: intact (R); bradykinetic (L) Finger to Nose: bradykinetic and undershooting (R); bradykinetic and undershooting (L)   Alternating Toe Tap: intact   Heel to Shin: intact bil, limited hip flexion ROM  CERVICAL ROM: (hx multiple cervical fusions)  Active  AROM  eval  Flexion  20deg  Extension 4deg  Right lateral flexion 10deg  Left lateral flexion 15deg  Right rotation 20deg  Left rotation 15deg, p!   (Blank rows = not tested)  UPPER EXTREMITY ROM:    Active  Right eval Left eval  Shoulder flexion 110 110    LOWER EXTREMITY ROM:    Limited seated hip flexion AROM (not associated with strength) HS length equal/WFL bil, no c/o neural tension Functionally observed limited hip ext bil  LOWER EXTREMITY MMT:    MMT Right eval Left eval  Hip flexion 4 4+  Knee flexion 4- 4  Knee extension 4 4+   (Blank rows = not tested)   FUNCTIONAL TESTS:  11/20/21:    5xSTS: 44.44 sec without UE support  10 m gait speed self-selected pace:    With SPC: 0.27 m/s   Without AD: .28 m/s  GAIT: Distance walked: 76f x2 Assistive device utilized: Single point cane Level of assistance: SBA Comments: limited hip ext bil, decreased R arm swing, SPC in L hand, decreased foot clearance bil, decreased step length, guarded movement    TODAY'S TREATMENT 01/15/22  Exercise/Activity Sets/ Reps/Time/ Resistance Assistance Charge type Comments  Rocker Board  3 x 45 sec  Neuro re-ed   Semitandem 3 x 45 sec  Neuro re-ed   Marching 10 x each  Neuro re-ed   Tandem stance 3 x 45 sec  Neuro re-ed   Airex step up 10 x ea   Neuro re-ed   SLS progression one foot floor, 1 foot step  3 x 30 sec ea   Neuro re-ed   STS      LAQ                        Treatment provided this session   Pt educated throughout session about proper posture and technique with exercises. Improved exercise technique, movement at target joints, use of target muscles after min to mod verbal, visual, tactile cues. Note: Portions of this document were prepared using Dragon voice recognition software and although reviewed may contain unintentional dictation errors in syntax, grammar, or spelling.  PATIENT EDUCATION:  Education details: HEP for balance and strength  Person educated: Patient  and girlfriend Education method: Explanation, Demonstration, Tactile cues, Verbal cues, and Handouts Education comprehension: verbalized understanding, verbal cues required, tactile cues required, and needs further education   HOME EXERCISE PROGRAM:  Access Code: [email or web address] URL: [email or web address] Date: 01/10/2022 Prepared by: Rivka Barbara  Exercises - Standing March with Counter Support  - 2 x daily - 7 x weekly - 3 sets - 10 reps - Side Stepping with Counter Support  - 2 x daily - 7 x weekly - 3 sets - 10 reps - Heel Toe Raises with Counter Support  - 2 x daily - 7 x weekly - 1 sets - 15 reps - Standing Hip Extension with Counter Support  - 1 x daily - 7 x weekly - 3 sets - 10 reps - Standing Alternating Knee Flexion  - 2 x daily - 7 x weekly - 3 sets - 10 reps - Sit to Stand with Armchair  - 2 x daily - 7 x weekly - 2 sets - 6 reps - Seated Long Arc Quad  - 1 x daily - 7 x weekly - 3 sets - 10 reps  Updated 11/20/21 Access Code: [email or web address] URL: [email or web address] Date: 11/20/2021 Prepared by: Larna Daughters  Exercises - Seated Gentle Upper Trapezius Stretch  - 1 x daily - 7 x weekly - 2 sets - 8 reps - 20 hold - Seated Levator Scapulae Stretch  - 1 x daily - 7 x weekly - 2 sets - 8 reps - 20 hold - Seated Scalenes Stretch  - 1 x daily - 7 x weekly - 2 sets - 8 reps - 20 hold - Doorway Pec Stretch at 60 Elevation  - 1 x daily - 7 x weekly - 2 sets - 8 reps - 20 hold - Median Nerve Glide  - 1 x daily - 7 x weekly - 2 sets - 10 reps - Supine Double Knee to Chest  - 1 x daily - 7 x weekly - 2 sets - 12 reps    Access Code: [email or web address] URL: [email or web address] Date: 11/13/2021 Prepared by: Amalia Hailey  Exercises - Seated Gentle Upper Trapezius Stretch  - 1 x daily - 7 x weekly - 2 sets - 8 reps - 20 hold - Seated Levator Scapulae Stretch  - 1 x daily - 7 x weekly - 2 sets - 8 reps - 20 hold - Seated Scalenes Stretch  - 1 x daily  - 7 x weekly - 2 sets - 8 reps - 20 hold - Doorway Pec Stretch at 60 Elevation  - 1 x daily - 7 x weekly - 2 sets - 8 reps - 20 hold  ASSESSMENT:  CLINICAL IMPRESSION: Patient presents with good motivation for completion of physical therapy activities.  Physical therapist introduced patient to several new lower extremity strengthening exercises in order to improve his overall mobility and quality of life.  Patient responds well to exercise initiated in the session.  We will continue with lower extremity strengthening and balance interventions next session as patient tolerates.  Patient will continue to benefit with skilled physical therapy interventions in order to improve lower extremity strength, endurance, mobility, prevent falls, and improve quality of life.   OBJECTIVE IMPAIRMENTS Abnormal gait, cardiopulmonary status limiting activity, decreased activity tolerance, decreased balance, decreased coordination, decreased endurance, decreased mobility, difficulty walking, decreased ROM, decreased strength, increased fascial restrictions, impaired perceived functional ability, increased muscle spasms, impaired flexibility, impaired  sensation, impaired UE functional use, improper body mechanics, obesity, and pain.  ACTIVITY LIMITATIONS carrying, lifting, bending, standing, squatting, sleeping, stairs, transfers, bed mobility, continence, dressing, reach over head, hygiene/grooming, and locomotion level  PARTICIPATION LIMITATIONS: meal prep, cleaning, laundry, shopping, community activity, occupation, yard work, and lifting/exercise  PERSONAL FACTORS Time since onset of injury/illness/exacerbation and 3+ comorbidities: T2DM, HTN, Seizures  are also affecting patient's functional outcome.   REHAB POTENTIAL: Fair secondary to duration of symptoms and complicated MSK diagnosis hx  CLINICAL DECISION MAKING: Stable/uncomplicated  EVALUATION COMPLEXITY: Moderate   GOALS: Goals reviewed with patient?  Yes  SHORT TERM GOALS: Target date: 02/12/2022  Pt will be Ind and compliant with HEP, to improve overall self-management of symptoms outside of therapy. Baseline: HEP provided; 01/04/2022- Patient reports trying to perform stretching as able and as pain allows.  Goal status: ONGOING   LONG TERM GOALS: Target date: 03/29/2022  Pt will report 2-3/10 pain at it's worst, to improve overall tolerance for upright functional mobility, and to improve overall quality of life. Baseline: 6-7/10; 01/04/2022= 7/10 -achy with worsensing with certain movement  Goal status: ONGOING  Pt will improve FOTO to target score to 44%, to demonstrate clinically significant improvement in perceived functional mobility. Baseline: eval= 35%; 01/04/2022- 40% Goal status: Ongoing  Pt will be asymptomatic with ULTT indicating improved muscle/nerve tension, for improved tolerance and ability to perform ADL's and IADL's. Baseline: median (+) with elbow extension; 01/04/2022- (+) on left; (-) on right Goal status: ONGOING  4.  Pt will increase shoulder AROM by 30deg grossly, to improve tolerance and independence with ADL's, and IADL's. Baseline: shoulder flexion 110deg; Left = 132 deg; Right = 128 deg Goal status: ONGOING  5. Pt will decrease 5TSTS by at least 8 seconds in order to demonstrate clinically significant improvement in LE strength.    Baseline: 01/04/2022= 50.07 sec without AD    Goal status: INITIAL    6. Pt will decrease TUG to below 20 seconds in order to demonstrate decreased fall risk    Baseline: 01/04/2022= 43.25 without an AD    7. Pt will increase 10MWT by at least 0.13 m/s in order to demonstrate clinically significant improvement in community ambulation.      Baseline: 01/04/2022=  33.45 sec (0.30 m/s)  without AD and 32.01 sec (0.312 m/s)  with SPC  7. Pt will increase BERg balance scale to 47/56 or greater in order to indicate improved LE balance and decreased risk of falls.      Baseline:  10/11: 41/56 PLAN: PT FREQUENCY: 2x/week  PT DURATION: 12 weeks  PLANNED INTERVENTIONS: Therapeutic exercises, Therapeutic activity, Neuromuscular re-education, Balance training, Gait training, Patient/Family education, Self Care, Joint mobilization, Stair training, DME instructions, Aquatic Therapy, Dry Needling, Electrical stimulation, Spinal mobilization, Cryotherapy, Moist heat, scar mobilization, Traction, Ultrasound, Manual therapy, and Re-evaluation.  PLAN FOR NEXT SESSION:  Progress LE strength and functional mobility interventions- progressive balance/therex/gait training as appropriate.    Particia Lather PT  9:08 AM,01/15/22

## 2022-01-17 ENCOUNTER — Ambulatory Visit: Payer: Medicare Other | Admitting: Physical Therapy

## 2022-01-17 ENCOUNTER — Telehealth: Payer: Self-pay | Admitting: Physical Therapy

## 2022-01-17 NOTE — Telephone Encounter (Signed)
Pt contacted via telephone and author left voice mail informing of missed appointment and informed pt of future PT appointment date and time.   Jessalyn Hinojosa PT, DPT   

## 2022-01-17 NOTE — Therapy (Deleted)
OUTPATIENT PHYSICAL THERAPY NEURO TREATMENT   Patient Name: Thomas Cole MRN: [id number] DOB:Aug 08, 1970, 51 y.o., male Today's Date: 01/17/2022        Past Medical History:  Diagnosis Date   Anxiety    Asthma    Back problem    Benign essential hypertension 11/30/2016   Cervical spondylosis without myelopathy 9/76/7341   Complication of anesthesia    occ takes longer to wake   Depression    Diabetes mellitus without complication ([place]) [id number]/7902   Type 2   GERD (gastroesophageal reflux disease)    Hypertension    Labile hypertension 03/19/2013   No current neuro changes, headache much improved. No clear sign of intracranial bleed. NO indication for head CT>  BP much better now... Fluctuates a lot.  Will eval for pheochromocytoma. As recommended at last cardiology OV.. Will prescribe hydralazine to use as needed for BP fluctuations.  Close follow up with PCP.    Major depressive disorder, recurrent episode, severe ([place]) 06/23/2015   Migraine headache    Migraines 11/11/2013   S/P appendectomy 04/09/2011   Seizure ([place]) 11/30/2016   Seizures ([place])    last 87   Shortness of breath    SOB (shortness of breath) 08/06/2014   - [facility] eval with cpst 02/20/2008 exercise testing demonstrateed normal functional capabilities and a normal cardiopulmonary response to exercise. - 09/16/2014  Walked RA x 3 laps @ 185 ft each stopped due to end of study/ nl pace/ no desat  - spirometry 09/16/2014 > no obst/ min restriction      Past Surgical History:  Procedure Laterality Date   ANTERIOR CERVICAL CORPECTOMY N/A 09/15/2012   Procedure: Cervical Three-Four,Cervical Six-Seven Anterior cervical decompression/diskectomy fusion with Cervical Four Corpectomy;  Surgeon: Kristeen Miss, MD;  Location: [facility];  Service: Neurosurgery;  Laterality: N/A;  Cervical Three-Four,Cervical Six-Seven  Anterior cervical decompression/diskectomy fusion with Cervical four Corpectomy   APPENDECTOMY  12   CERVICAL DISC  SURGERY     CHOLECYSTECTOMY N/A 03/29/2017   Procedure: LAPAROSCOPIC CHOLECYSTECTOMY;  Surgeon: Florene Glen, MD;  Location: [facility];  Service: General;  Laterality: N/A;   COLONOSCOPY WITH PROPOFOL N/A 10/23/2019   Procedure: COLONOSCOPY WITH PROPOFOL;  Surgeon: Robert Bellow, MD;  Location: [facility];  Service: Endoscopy;  Laterality: N/A;   ESOPHAGOGASTRODUODENOSCOPY (EGD) WITH PROPOFOL N/A 10/23/2019   Procedure: ESOPHAGOGASTRODUODENOSCOPY (EGD) WITH PROPOFOL;  Surgeon: Robert Bellow, MD;  Location: [facility];  Service: Endoscopy;  Laterality: N/A;   KNEE SURGERY     bilateral   POSTERIOR FUSION CERVICAL SPINE     Patient Active Problem List   Diagnosis Date Noted   Spinal stenosis, lumbar region, with neurogenic claudication 07/04/2021   Chronic radicular lumbar pain 07/04/2021   Neuroforaminal stenosis of lumbar spine (L3,4,5) 07/04/2021   Thoracic facet joint syndrome 09/24/2019   Chronic pain syndrome 07/14/2019   Failed cervical fusion 11/20/2018   Episode of recurrent major depressive disorder ([place]) 11/20/2018   Myelopathy of cervical spinal cord with cervical radiculopathy ([place]) 05/13/2018   Bilateral occipital neuralgia 02/11/2018   Degeneration of cervical intervertebral disc with myelopathy 06/13/2017   H/O cervical spinal arthrodesis 05/16/2017   Thoracic radiculopathy 05/16/2017   Acute cholecystitis 03/29/2017   Anxiety 11/30/2016   Benign essential hypertension 11/30/2016   Seizure ([place]) 11/30/2016   Localization-related symptomatic epilepsy and epileptic syndromes with complex partial seizures, not intractable, without status epilepticus ([place]) 02/21/2016   Generalized anxiety disorder 06/23/2015   Major depressive disorder, recurrent episode,  severe ([place]) 06/23/2015   PTSD (post-traumatic stress disorder) 06/16/2015   SOB (shortness of breath) 08/06/2014   Obesity (BMI 30-39.9) 11/11/2013   Migraines 11/11/2013   Asthma, mild intermittent  11/11/2013   History of seizures 11/11/2013   GERD without esophagitis 11/11/2013   Depression 11/11/2013   Labile hypertension 03/19/2013   Spondylosis, cervical, with myelopathy 09/17/2012   S/P appendectomy 04/09/2011   Sleep apnea 05/25/2010    PCP: Derinda Late, MD  REFERRING PROVIDER: Vladimir Crofts, MD  REFERRING DIAG: Parkinson's Disease  Rationale for Evaluation and Treatment Rehabilitation  THERAPY DIAG:  Abnormality of gait and mobility  Difficulty in walking, not elsewhere classified  Other abnormalities of gait and mobility  Muscle weakness (generalized)  ONSET DATE: 2013-2014  SUBJECTIVE:                                                                                                                                                                                           SUBJECTIVE STATEMENT: Pt reports having some concerns with the valet parking asking him "if PT wants him to use wheelchair" Pt used to work out heavy at gym until injury. Pt wants to get back to maintaining what is there because he knows he lost a lot in the last three years.   PERTINENT HISTORY:  Pt with extensive medical history concerning cervical, thoracic, and lumbar spine diagnoses including: thoracic radiculopathy, myelopathy of cervical spine cord, chronic pain syndrome, thoracic facet joint syndrome, hx lumbar laminectomy (1994), hx cervical fusion x2 (C3-4 ~24yr ago, C3-7 ~7-860yrago). Per pt, his MD has concern for myelomalacia of lower spinal cord. Pt with recent hospital admission in January of this year due to acute/chronic low back pain. Pt notes episode of bowel/bladder incontinence, but denies saddle anesthesia.  Pt arrives to OPPT with c/o cervical and low back pain. Pt notes that this has been an ongoing problem for years, and it has been affecting his Independence at home, as well as his quality of life. Per pt, the MD said "he's one bad fall away from being done" as he states  his MD has concern for myelomalacia. Pt notes that symptoms are worse on the L arm and L leg. Pt enjoys being outside and working on cars. His significant other states he does daily walks down the driveway.  PAIN:  Are you having pain? Yes: NPRS scale: 7-8/10 Pain location: neck, back, lats   PRECAUTIONS: Fall  WEIGHT BEARING RESTRICTIONS No  FALLS:  Has patient fallen in last 6 months? Yes. Number of falls 1;  sitting on rollator without breaks locked; lost balance and fell off rollator  LIVING ENVIRONMENT: Lives with: lives alone Lives in: House/apartment Stairs: Yes: External: 3-4 steps; on left going up Has following equipment at home: Single point cane, Environmental consultant - 4 wheeled, Wheelchair (power), Wheelchair (manual), shower chair, and Grab bars. Lift chair recliner.  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 Severe spinal canal stenosis at the L2-L5 levels due to combination of disc bulges and congenitally narrow spinal canal. 2. Moderate right and severe left neural foraminal stenosis at L4-5 and moderate left neural foraminal stenosis at L5-S1. 3. No thoracic spinal canal or neural foraminal stenosis.   PATIENT SURVEYS:  FOTO 35%  COGNITION:  Overall cognitive status: Within functional limits for tasks assessed     SENSATION: Light touch: diminished sensation LLE compared to RLE Touch discrimination: intact bil   COORDINATION:   Opposition: intact (R); bradykinetic (L) Finger to Nose: bradykinetic and undershooting (R); bradykinetic and undershooting (L)   Alternating Toe Tap: intact   Heel to Shin: intact bil, limited hip flexion ROM  CERVICAL ROM: (hx multiple cervical fusions)  Active  AROM  eval  Flexion  20deg  Extension 4deg  Right lateral flexion 10deg  Left lateral flexion 15deg  Right rotation 20deg  Left rotation 15deg, p!   (Blank rows = not tested)  UPPER EXTREMITY ROM:    Active  Right eval Left eval  Shoulder flexion 110 110    LOWER EXTREMITY ROM:    Limited seated hip flexion AROM (not associated with strength) HS length equal/WFL bil, no c/o neural tension Functionally observed limited hip ext bil  LOWER EXTREMITY MMT:    MMT Right eval Left eval  Hip flexion 4 4+  Knee flexion 4- 4  Knee extension 4 4+   (Blank rows = not tested)   FUNCTIONAL TESTS:  11/20/21:    5xSTS: 44.44 sec without UE support  10 m gait speed self-selected pace:    With SPC: 0.27 m/s   Without AD: .28 m/s  GAIT: Distance walked: 3f x2 Assistive device utilized: Single point cane Level of assistance: SBA Comments: limited hip ext bil, decreased R arm swing, SPC in L hand, decreased foot clearance bil, decreased step length, guarded movement    TODAY'S TREATMENT 01/17/22  Exercise/Activity Sets/ Reps/Time/ Resistance Assistance Charge type Comments  Rocker Board  3 x 45 sec  Neuro re-ed   Semitandem 3 x 45 sec  Neuro re-ed   Marching 10 x each  Neuro re-ed   Tandem stance 3 x 45 sec  Neuro re-ed   Airex step up 10 x ea   Neuro re-ed   SLS progression one foot floor, 1 foot step  3 x 30 sec ea   Neuro re-ed   STS      LAQ                        Treatment provided this session   Pt educated throughout session about proper posture and technique with exercises. Improved exercise technique, movement at target joints, use of target muscles after min to mod verbal, visual, tactile cues. Note: Portions of this document were prepared using Dragon voice recognition software and although reviewed may contain unintentional dictation errors in syntax, grammar, or spelling.  PATIENT EDUCATION:  Education details: HEP for balance and strength  Person educated: Patient  and girlfriend Education method: Explanation, Demonstration, Tactile cues, Verbal cues, and Handouts Education comprehension: verbalized understanding, verbal cues required, tactile cues required, and needs further education   HOME EXERCISE PROGRAM:  Access Code: [email or web address] URL: [email or web address] Date: 01/10/2022 Prepared by: Rivka Barbara  Exercises - Standing March with Counter Support  - 2 x daily - 7 x weekly - 3 sets - 10 reps - Side Stepping with Counter Support  - 2 x daily - 7 x weekly - 3 sets - 10 reps - Heel Toe Raises with Counter Support  - 2 x daily - 7 x weekly - 1 sets - 15 reps - Standing Hip Extension with Counter Support  - 1 x daily - 7 x weekly - 3 sets - 10 reps - Standing Alternating Knee Flexion  - 2 x daily - 7 x weekly - 3 sets - 10 reps - Sit to Stand with Armchair  - 2 x daily - 7 x weekly - 2 sets - 6 reps - Seated Long Arc Quad  - 1 x daily - 7 x weekly - 3 sets - 10 reps  Updated 11/20/21 Access Code: [email or web address] URL: [email or web address] Date: 11/20/2021 Prepared by: Larna Daughters  Exercises - Seated Gentle Upper Trapezius Stretch  - 1 x daily - 7 x weekly - 2 sets - 8 reps - 20 hold - Seated Levator Scapulae Stretch  - 1 x daily - 7 x weekly - 2 sets - 8 reps - 20 hold - Seated Scalenes Stretch  - 1 x daily - 7 x weekly - 2 sets - 8 reps - 20 hold - Doorway Pec Stretch at 60 Elevation  - 1 x daily - 7 x weekly - 2 sets - 8 reps - 20 hold - Median Nerve Glide  - 1 x daily - 7 x weekly - 2 sets - 10 reps - Supine Double Knee to Chest  - 1 x daily - 7 x weekly - 2 sets - 12 reps    Access Code: [email or web address] URL: [email or web address] Date: 11/13/2021 Prepared by: Amalia Hailey  Exercises - Seated Gentle Upper Trapezius Stretch  - 1 x daily - 7 x weekly - 2 sets - 8 reps - 20 hold - Seated Levator Scapulae Stretch  - 1 x daily - 7 x weekly - 2 sets - 8 reps - 20 hold - Seated Scalenes Stretch  - 1 x daily  - 7 x weekly - 2 sets - 8 reps - 20 hold - Doorway Pec Stretch at 60 Elevation  - 1 x daily - 7 x weekly - 2 sets - 8 reps - 20 hold  ASSESSMENT:  CLINICAL IMPRESSION: Patient presents with good motivation for completion of physical therapy activities.  Physical therapist introduced patient to several new lower extremity strengthening exercises in order to improve his overall mobility and quality of life.  Patient responds well to exercise initiated in the session.  We will continue with lower extremity strengthening and balance interventions next session as patient tolerates.  Patient will continue to benefit with skilled physical therapy interventions in order to improve lower extremity strength, endurance, mobility, prevent falls, and improve quality of life.   OBJECTIVE IMPAIRMENTS Abnormal gait, cardiopulmonary status limiting activity, decreased activity tolerance, decreased balance, decreased coordination, decreased endurance, decreased mobility, difficulty walking, decreased ROM, decreased strength, increased fascial restrictions, impaired perceived functional ability, increased muscle spasms, impaired flexibility, impaired  sensation, impaired UE functional use, improper body mechanics, obesity, and pain.  ACTIVITY LIMITATIONS carrying, lifting, bending, standing, squatting, sleeping, stairs, transfers, bed mobility, continence, dressing, reach over head, hygiene/grooming, and locomotion level  PARTICIPATION LIMITATIONS: meal prep, cleaning, laundry, shopping, community activity, occupation, yard work, and lifting/exercise  PERSONAL FACTORS Time since onset of injury/illness/exacerbation and 3+ comorbidities: T2DM, HTN, Seizures  are also affecting patient's functional outcome.   REHAB POTENTIAL: Fair secondary to duration of symptoms and complicated MSK diagnosis hx  CLINICAL DECISION MAKING: Stable/uncomplicated  EVALUATION COMPLEXITY: Moderate   GOALS: Goals reviewed with patient?  Yes  SHORT TERM GOALS: Target date: 02/14/2022  Pt will be Ind and compliant with HEP, to improve overall self-management of symptoms outside of therapy. Baseline: HEP provided; 01/04/2022- Patient reports trying to perform stretching as able and as pain allows.  Goal status: ONGOING   LONG TERM GOALS: Target date: 03/29/2022  Pt will report 2-3/10 pain at it's worst, to improve overall tolerance for upright functional mobility, and to improve overall quality of life. Baseline: 6-7/10; 01/04/2022= 7/10 -achy with worsensing with certain movement  Goal status: ONGOING  Pt will improve FOTO to target score to 44%, to demonstrate clinically significant improvement in perceived functional mobility. Baseline: eval= 35%; 01/04/2022- 40% Goal status: Ongoing  Pt will be asymptomatic with ULTT indicating improved muscle/nerve tension, for improved tolerance and ability to perform ADL's and IADL's. Baseline: median (+) with elbow extension; 01/04/2022- (+) on left; (-) on right Goal status: ONGOING  4.  Pt will increase shoulder AROM by 30deg grossly, to improve tolerance and independence with ADL's, and IADL's. Baseline: shoulder flexion 110deg; Left = 132 deg; Right = 128 deg Goal status: ONGOING  5. Pt will decrease 5TSTS by at least 8 seconds in order to demonstrate clinically significant improvement in LE strength.    Baseline: 01/04/2022= 50.07 sec without AD    Goal status: INITIAL    6. Pt will decrease TUG to below 20 seconds in order to demonstrate decreased fall risk    Baseline: 01/04/2022= 43.25 without an AD    7. Pt will increase 10MWT by at least 0.13 m/s in order to demonstrate clinically significant improvement in community ambulation.      Baseline: 01/04/2022=  33.45 sec (0.30 m/s)  without AD and 32.01 sec (0.312 m/s)  with SPC  7. Pt will increase BERg balance scale to 47/56 or greater in order to indicate improved LE balance and decreased risk of falls.      Baseline:  10/11: 41/56 PLAN: PT FREQUENCY: 2x/week  PT DURATION: 12 weeks  PLANNED INTERVENTIONS: Therapeutic exercises, Therapeutic activity, Neuromuscular re-education, Balance training, Gait training, Patient/Family education, Self Care, Joint mobilization, Stair training, DME instructions, Aquatic Therapy, Dry Needling, Electrical stimulation, Spinal mobilization, Cryotherapy, Moist heat, scar mobilization, Traction, Ultrasound, Manual therapy, and Re-evaluation.  PLAN FOR NEXT SESSION:  Progress LE strength and functional mobility interventions- progressive balance/therex/gait training as appropriate.    Particia Lather PT  8:11 AM,01/17/22

## 2022-01-18 ENCOUNTER — Encounter: Payer: Self-pay | Admitting: Student in an Organized Health Care Education/Training Program

## 2022-01-18 ENCOUNTER — Ambulatory Visit
Payer: Medicare Other | Attending: Student in an Organized Health Care Education/Training Program | Admitting: Student in an Organized Health Care Education/Training Program

## 2022-01-18 VITALS — BP 125/79 | HR 88 | Temp 97.3°F | Ht 74.0 in | Wt 309.0 lb

## 2022-01-18 DIAGNOSIS — G959 Disease of spinal cord, unspecified: Secondary | ICD-10-CM | POA: Diagnosis present

## 2022-01-18 DIAGNOSIS — M960 Pseudarthrosis after fusion or arthrodesis: Secondary | ICD-10-CM | POA: Diagnosis present

## 2022-01-18 DIAGNOSIS — M5414 Radiculopathy, thoracic region: Secondary | ICD-10-CM

## 2022-01-18 DIAGNOSIS — G8929 Other chronic pain: Secondary | ICD-10-CM | POA: Diagnosis present

## 2022-01-18 DIAGNOSIS — M5412 Radiculopathy, cervical region: Secondary | ICD-10-CM

## 2022-01-18 DIAGNOSIS — M542 Cervicalgia: Secondary | ICD-10-CM

## 2022-01-18 DIAGNOSIS — G894 Chronic pain syndrome: Secondary | ICD-10-CM

## 2022-01-18 DIAGNOSIS — M48061 Spinal stenosis, lumbar region without neurogenic claudication: Secondary | ICD-10-CM | POA: Diagnosis present

## 2022-01-18 DIAGNOSIS — Z981 Arthrodesis status: Secondary | ICD-10-CM | POA: Diagnosis present

## 2022-01-18 DIAGNOSIS — M5416 Radiculopathy, lumbar region: Secondary | ICD-10-CM

## 2022-01-18 MED ORDER — HYDROCODONE BITARTRATE ER 10 MG PO CP12: 10.0000 mg | ORAL_CAPSULE | Freq: Two times a day (BID) | ORAL | 0 refills | Status: AC

## 2022-01-18 MED ORDER — NORTRIPTYLINE HCL 25 MG PO CAPS: 50.0000 mg | ORAL_CAPSULE | Freq: Every day | ORAL | 5 refills | Status: DC

## 2022-01-18 MED ORDER — HYDROCODONE BITARTRATE ER 10 MG PO CP12: 10.0000 mg | ORAL_CAPSULE | Freq: Two times a day (BID) | ORAL | 0 refills | Status: DC

## 2022-01-18 MED ORDER — DULOXETINE HCL 60 MG PO CPEP: 60.0000 mg | ORAL_CAPSULE | Freq: Every day | ORAL | 5 refills | Status: DC

## 2022-01-18 MED ORDER — HYDROCODONE-ACETAMINOPHEN 10-325 MG PO TABS: 1.0000 | ORAL_TABLET | Freq: Three times a day (TID) | ORAL | 0 refills | Status: DC | PRN

## 2022-01-18 MED ORDER — TIZANIDINE HCL 4 MG PO TABS: 4.0000 mg | ORAL_TABLET | Freq: Three times a day (TID) | ORAL | 11 refills | Status: DC | PRN

## 2022-01-18 NOTE — Progress Notes (Signed)
Nursing Pain Medication Assessment:  On Mr. Widdowson regular appointment, he did not comply with our medication refill policy of bringing his pills and bottle(s) to be examined and counted. As a consequence, his prescriptions were written and held, until he could bring back the medications to be examined. He comes in now to comply with this requirement.  Medication #1: Hydrocodone/APAP Pill/Patch Count:  29 of 90 pills remain Bottle Appearance: Standard pharmacy container. Clearly labeled. Filled Date: 48 / 27 / 2023 Last Medication intake:  Today  Medication #2:  hydrocodone ER 10 mg Pill/Patch Count:  19 of 60 pills remain Bottle Appearance: Standard pharmacy container. Clearly labeled. Filled Date: 69 / 27 / 2023 Last Medication intake:  Today  Prescriptions: Written prescriptions to last for 1 month given to patient once pill count and bottle inspection was completed.  Date: 01/18/22; Time: 11:27 AMSafety precautions to be maintained throughout the outpatient stay will include: orient to surroundings, keep bed in low position, maintain call bell within reach at all times, provide assistance with transfer out of bed and ambulation.

## 2022-01-18 NOTE — Progress Notes (Signed)
PROVIDER NOTE: Information contained herein reflects review and annotations entered in association with encounter. Interpretation of such information and data should be left to medically-trained personnel. Information provided to patient can be located elsewhere in the medical record under "Patient Instructions". Document created using STT-dictation technology, any transcriptional errors that may result from process are unintentional.    Patient: Thomas Cole  Service Category: E/M  Provider: Gillis Santa, MD  DOB: 12/05/1970  DOS: 01/18/2022  Specialty: Interventional Pain Management  MRN: [id number]  Setting: Ambulatory outpatient  PCP: Derinda Late, MD  Type: Established Patient    Referring Provider: Derinda Late, MD  Location: Office  Delivery: Face-to-face     HPI  [name], a 51 y.o. year old male, is here today because of his Lumbar radiculopathy [M54.16]. [name] primary complain today is Neck Pain (Constant through shoulders)  Last encounter: My last encounter with him was on 12/11/21 Pertinent problems: Thomas Cole has Spondylosis, cervical, with myelopathy; Migraines; History of seizures; Generalized anxiety disorder; and Bilateral occipital neuralgia on their pertinent problem list. Pain Assessment: Severity of Chronic pain is reported as a 5 /10. Location: Neck  /through shoulders. Onset: More than a month ago. Quality: Aching, Burning. Timing: Constant. Modifying factor(s): nothing. Vitals:  height is 6' 2" (1.88 m) and weight is 309 lb (140.2 kg) (abnormal). His temporal temperature is 97.3 F (36.3 C) (abnormal). His blood pressure is 125/79 and his pulse is 88. His oxygen saturation is 98%.   Reason for encounter: both, medication management and post-procedure assessment.  Thomas Cole presents today for medication management and postprocedural evaluation after his previous lumbar epidural steroid injection.  He states that he has significantly less radiating leg pain  and is able to function more comfortably after his previous lumbar epidural steroid injection.  He notes more pain relief with this compared to his first 1.  He is also losing weight by dieting and trying to walk more.  He states that he has really modified his diet and eliminated processed foods and junk foods.  He is endorsing increased periscapular and trapezius spasms at night before bed.  He states that it is difficult for him to sleep.  He has tried tizanidine with limited response.  I recommend that he trial magnesium 500 to 1000 mg nightly to see if that helps with the spasms.  Otherwise refill his chronic pain medications below, no change in dose.  UDS up-to-date and appropriate.  Repeat lumbar epidural steroid injection as needed given return of pain.  Post-procedure evaluation   Type: Lumbar epidural steroid injection (LESI) (interlaminar) #2    Laterality: Left   Level:  L3-4 Level.  Imaging: Fluoroscopic guidance Anesthesia: Local anesthesia (1-2% Lidocaine) Anxiolysis: Oral Valium 10 mg Sedation:  minimal . DOS: 12/11/21 Performed by: Gillis Santa, MD  Purpose: Diagnostic/Therapeutic Indications: Lumbar radicular pain of intraspinal etiology of more than 4 weeks that has failed to respond to conservative therapy and is severe enough to impact quality of life or function. 1. Lumbar radiculopathy (L>R)   2. Chronic radicular lumbar pain   3. Neuroforaminal stenosis of lumbar spine (L3,4,5)   4. Chronic pain syndrome     NAS-11 Pain score:   Pre-procedure: 8 /10   Post-procedure: 8 /10      Effectiveness:  Initial hour after procedure: 100 %  Subsequent 4-6 hours post-procedure: 100 %  Analgesia past initial 6 hours: 75 % (ongoing)  Ongoing improvement:  Analgesic:  75% Function: Somewhat improved ROM:  Somewhat improved       Pharmacotherapy Assessment  Analgesic: Hydrocodone extended release 10 mg twice daily, hydrocodone immediate release 10 mg 3 times daily as  needed for breakthrough pain    Monitoring:Hydrocodone extended release 10 mg twice daily, hydrocodone immediate release 10 mg 3 times daily as needed for breakthrough pain   [place] PMP: PDMP reviewed during this encounter.       Pharmacotherapy: No side-effects or adverse reactions reported. Compliance: No problems identified. Effectiveness: Clinically acceptable.  UDS:  Summary  Date Value Ref Range Status  10/17/2021 Note  Final    Comment:    ==================================================================== Compliance Drug Analysis, Ur ==================================================================== Test                             Result       Flag       Units  Drug Present and Declared for Prescription Verification   7-aminoclonazepam              96           EXPECTED   ng/mg creat    7-aminoclonazepam is an expected metabolite of clonazepam. Source of    clonazepam is a scheduled prescription medication.    Hydrocodone                    758          EXPECTED   ng/mg creat   Hydromorphone                  87           EXPECTED   ng/mg creat   Dihydrocodeine                 263          EXPECTED   ng/mg creat   Norhydrocodone                 1508         EXPECTED   ng/mg creat    Sources of hydrocodone include scheduled prescription medications.    Hydromorphone, dihydrocodeine and norhydrocodone are expected    metabolites of hydrocodone. Hydromorphone and dihydrocodeine are    also available as scheduled prescription medications.    Pregabalin                     PRESENT      EXPECTED   Tizanidine                     PRESENT      EXPECTED   Duloxetine                     PRESENT      EXPECTED   Nortriptyline                  PRESENT      EXPECTED    Nortriptyline may be administered as a prescription drug; it is also    an expected metabolite of amitriptyline.    Acetaminophen                  PRESENT      EXPECTED  Drug Absent but Declared for Prescription  Verification   Phenytoin                      Not Detected  UNEXPECTED   Metoprolol                     Not Detected UNEXPECTED ==================================================================== Test                      Result    Flag   Units      Ref Range   Creatinine              76               mg/dL      >=20 ==================================================================== Declared Medications:  The flagging and interpretation on this report are based on the  following declared medications.  Unexpected results may arise from  inaccuracies in the declared medications.   **Note: The testing scope of this panel includes these medications:   Clonazepam (Klonopin)  Duloxetine (Cymbalta)  Hydrocodone  Hydrocodone (Norco)  Metoprolol (Toprol)  Nortriptyline (Pamelor)  Phenytoin (Dilantin)  Pregabalin (Lyrica)   **Note: The testing scope of this panel does not include small to  moderate amounts of these reported medications:   Acetaminophen (Norco)  Tizanidine (Zanaflex)   **Note: The testing scope of this panel does not include the  following reported medications:   Carbidopa (Sinemet)  Cyanocobalamin  Furosemide (Lasix)  Glipizide  Hydrochlorothiazide (Hydrodiuril)  Levodopa (Sinemet)  Losartan (Cozaar)  Metformin (Glucophage)  Omeprazole (Prilosec)  Ubrogepant Roselyn Meier)  Vitamin D3 ==================================================================== For clinical consultation, please call [phone]. ====================================================================       ROS  Constitutional: Denies any fever or chills Gastrointestinal: No reported hemesis, hematochezia, vomiting, or acute GI distress Musculoskeletal:  cervicalgia  L>right hip pain; low back pain with radiation into left leg improved after LESI Neurological: No reported episodes of acute onset apraxia, aphasia, dysarthria, agnosia, amnesia, paralysis, loss of coordination, or loss of  consciousness  Medication Review  DULoxetine, HYDROcodone Bitartrate ER, HYDROcodone-acetaminophen, Ubrogepant, Vitamin D3, carbidopa-levodopa, clonazePAM, cyanocobalamin, furosemide, hydrochlorothiazide, losartan, metFORMIN, metoprolol succinate, nortriptyline, omeprazole, phenytoin, pregabalin, sildenafil, and tiZANidine  History Review  Allergy: Mr. Pyle is allergic to amitriptyline, bee venom, chlorhexidine, carbamazepine, meloxicam, morphine and related, and sulfa antibiotics. Drug: Thomas Cole  reports no history of drug use. Alcohol:  reports no history of alcohol use. Tobacco:  reports that he has never smoked. He has never used smokeless tobacco. Social: Thomas Cole  reports that he has never smoked. He has never used smokeless tobacco. He reports that he does not drink alcohol and does not use drugs. Medical:  has a past medical history of Anxiety, Asthma, Back problem, Benign essential hypertension (11/30/2016), Cervical spondylosis without myelopathy (06/29/760), Complication of anesthesia, Depression, Diabetes mellitus without complication ([place]) (26/3335), GERD (gastroesophageal reflux disease), Hypertension, Labile hypertension (03/19/2013), Major depressive disorder, recurrent episode, severe ([place]) (06/23/2015), Migraine headache, Migraines (11/11/2013), S/P appendectomy (04/09/2011), Seizure ([place]) (11/30/2016), Seizures ([place]), Shortness of breath, and SOB (shortness of breath) (08/06/2014). Surgical: Mr. Stanbery  has a past surgical history that includes Knee surgery; Posterior fusion cervical spine; Cervical disc surgery; Appendectomy (12); Anterior cervical corpectomy (N/A, 09/15/2012); Cholecystectomy (N/A, 03/29/2017); Colonoscopy with propofol (N/A, 10/23/2019); and Esophagogastroduodenoscopy (egd) with propofol (N/A, 10/23/2019). Family: family history includes Arthritis in his father, maternal grandfather, maternal grandmother, mother, paternal grandfather, and paternal grandmother; Asthma in  his mother; Coronary artery disease in his father and mother; Diabetes in his father, paternal grandfather, and paternal grandmother; Heart disease in his father, maternal grandfather, maternal grandmother, paternal grandfather, and paternal grandmother; Hypertension in his maternal grandfather, maternal  grandmother, mother, paternal grandfather, and paternal grandmother; Mental illness in his mother.  Laboratory Chemistry Profile   Renal Lab Results  Component Value Date   BUN 13 04/21/2017   CREATININE 0.93 04/21/2017   LABCREA 79.3 03/05/2014   GFR 82.19 08/05/2014   GFRAA >60 04/21/2017   GFRNONAA >60 04/21/2017     Hepatic Lab Results  Component Value Date   AST 30 04/21/2017   ALT 23 04/21/2017   ALBUMIN 4.2 04/21/2017   ALKPHOS 86 04/21/2017   LIPASE 51 04/21/2017     Electrolytes Lab Results  Component Value Date   NA 138 04/21/2017   K 3.7 04/21/2017   CL 101 04/21/2017   CALCIUM 9.4 04/21/2017   MG 1.9 04/26/2020     Bone Lab Results  Component Value Date   VD25OH 31.95 03/05/2016   25OHVITD1 35 04/26/2020   25OHVITD2 <1.0 04/26/2020   25OHVITD3 35 04/26/2020     Inflammation (CRP: Acute Phase) (ESR: Chronic Phase) Lab Results  Component Value Date   CRP 10 04/26/2020   ESRSEDRATE 22 04/26/2020       Note: Above Lab results reviewed.   Physical Exam  General appearance: Well nourished, well developed, and well hydrated. In no apparent acute distress Mental status: Alert, oriented x 3 (person, place, & time)       Respiratory: No evidence of acute respiratory distress Eyes: PERLA Vitals: BP 125/79 (BP Location: Right Arm, Patient Position: Sitting, Cuff Size: Large)   Pulse 88   Temp (!) 97.3 F (36.3 C) (Temporal)   Ht 6' 2" (1.88 m)   Wt (!) 309 lb (140.2 kg)   SpO2 98%   BMI 39.67 kg/m  BMI: Estimated body mass index is 39.67 kg/m as calculated from the following:   Height as of this encounter: 6' 2" (1.88 m).   Weight as of this  encounter: 309 lb (140.2 kg). Ideal: Ideal body weight: 82.2 kg (181 lb 3.5 oz) Adjusted ideal body weight: 105.4 kg (232 lb 5.3 oz)  Cervical Spine Area Exam  Skin & Axial Inspection: Well healed scar from previous spine surgery detected Alignment: Symmetrical Functional ROM: Pain restricted ROM, bilaterally Stability: No instability detected Muscle Tone/Strength: Functionally intact. No obvious neuro-muscular anomalies detected. Sensory (Neurological): Dermatomal pain pattern and neurogenic Palpation: No palpable anomalies               Upper Extremity (UE) Exam      Side: Right upper extremity   Side: Left upper extremity  Skin & Extremity Inspection: Skin color, temperature, and hair growth are WNL. No peripheral edema or cyanosis. No masses, redness, swelling, asymmetry, or associated skin lesions. No contractures.   Skin & Extremity Inspection: Skin color, temperature, and hair growth are WNL. No peripheral edema or cyanosis. No masses, redness, swelling, asymmetry, or associated skin lesions. No contractures.  Functional ROM: Pain restricted ROM for shoulder and elbow   Functional ROM: Pain restricted ROM for shoulder and elbow  Muscle Tone/Strength: Functionally intact. No obvious neuro-muscular anomalies detected.   Muscle Tone/Strength: Functionally intact. No obvious neuro-muscular anomalies detected.  Sensory (Neurological): Unimpaired           Sensory (Neurological): Unimpaired          Palpation: No palpable anomalies               Palpation: No palpable anomalies              Provocative Test(s):  Phalen's test: deferred Tinel's test:  deferred Apley's scratch test (touch opposite shoulder):  Action 1 (Across chest): Decreased ROM Action 2 (Overhead): Decreased ROM Action 3 (LB reach): Decreased ROM       Provocative Test(s):  Phalen's test: deferred Tinel's test: deferred Apley's scratch test (touch opposite shoulder):  Action 1 (Across chest): Decreased ROM Action 2  (Overhead): Decreased ROM Action 3 (LB reach): Decreased ROM     Lumbar Spine Area Exam  Skin & Axial Inspection: No masses, redness, or swelling Alignment: Symmetrical Functional ROM: Pain restricted ROM      , improved after LESI Stability: No instability detected Muscle Tone/Strength: Functionally intact. No obvious neuro-muscular anomalies detected. Sensory (Neurological): Dermatomal pain pattern left greater than right, improved after ESI   Gait & Posture Assessment  Ambulation: Patient came in today in a wheel chair Gait: Antalgic gait (limping) Posture: Difficulty standing up straight, due to pain  Lower Extremity Exam    Side: Right lower extremity  Side: Left lower extremity  Stability: No instability observed          Stability: No instability observed          Skin & Extremity Inspection: Skin color, temperature, and hair growth are WNL. No peripheral edema or cyanosis. No masses, redness, swelling, asymmetry, or associated skin lesions. No contractures.  Skin & Extremity Inspection: Skin color, temperature, and hair growth are WNL. No peripheral edema or cyanosis. No masses, redness, swelling, asymmetry, or associated skin lesions. No contractures.  Functional ROM: Unrestricted ROM                  Functional ROM: Pain restricted ROM for hip and knee joints, improved after LESI Adequate SLR (straight leg raise)  Muscle Tone/Strength: Functionally intact. No obvious neuro-muscular anomalies detected.  Muscle Tone/Strength: Functionally intact. No obvious neuro-muscular anomalies detected.  Sensory (Neurological): Unimpaired        Sensory (Neurological): Dermatomal pain pattern      improved after LESI  DTR: Patellar: deferred today Achilles: deferred today Plantar: deferred today  DTR: Patellar: deferred today Achilles: deferred today Plantar: deferred today  Palpation: No palpable anomalies  Palpation: No palpable anomalies     Assessment   Status Diagnosis   Responding Responding Persistent 1. Lumbar radiculopathy (L>R)   2. Failed cervical fusion   3. H/O cervical spinal arthrodesis   4. Cervicalgia   5. Thoracic radiculopathy   6. Myelopathy of cervical spinal cord with cervical radiculopathy (HCC)   7. Chronic radicular lumbar pain   8. Neuroforaminal stenosis of lumbar spine (L3,4,5)   9. Chronic pain syndrome        Plan of Care  Thomas Cole has a current medication list which includes the following long-term medication(s): carbidopa-levodopa, carbidopa-levodopa, clonazepam, metformin, metoprolol succinate, pregabalin, sildenafil, duloxetine, [START ON 01/26/2022] hydrocodone-acetaminophen, [START ON 02/25/2022] hydrocodone-acetaminophen, [START ON 03/27/2022] hydrocodone-acetaminophen, and nortriptyline.  Pharmacotherapy (Medications Ordered): Meds ordered this encounter  Medications   HYDROcodone-acetaminophen (NORCO) 10-325 MG tablet    Sig: Take 1 tablet by mouth every 8 (eight) hours as needed. For chronic pain syndrome. Each Rx to last 30 days.    Dispense:  90 tablet    Refill:  0   HYDROcodone-acetaminophen (NORCO) 10-325 MG tablet    Sig: Take 1 tablet by mouth every 8 (eight) hours as needed. For chronic pain syndrome. Each Rx to last 30 days.    Dispense:  90 tablet    Refill:  0   HYDROcodone-acetaminophen (NORCO) 10-325 MG tablet  Sig: Take 1 tablet by mouth every 8 (eight) hours as needed. For chronic pain syndrome. Each Rx to last 30 days.    Dispense:  90 tablet    Refill:  0   HYDROcodone Bitartrate ER 10 MG CP12    Sig: Take 10 mg by mouth every 12 (twelve) hours. Must last 30 days.    Dispense:  60 capsule    Refill:  0    Chronic Pain: STOP Act (Not applicable) Fill 1 day early if closed on refill date. Avoid benzodiazepines within 8 hours of opioids   HYDROcodone Bitartrate ER 10 MG CP12    Sig: Take 10 mg by mouth every 12 (twelve) hours. Must last 30 days.    Dispense:  60 capsule    Refill:   0    Chronic Pain: STOP Act (Not applicable) Fill 1 day early if closed on refill date. Avoid benzodiazepines within 8 hours of opioids   HYDROcodone Bitartrate ER 10 MG CP12    Sig: Take 10 mg by mouth every 12 (twelve) hours. Must last 30 days.    Dispense:  60 capsule    Refill:  0    Chronic Pain: STOP Act (Not applicable) Fill 1 day early if closed on refill date. Avoid benzodiazepines within 8 hours of opioids   tiZANidine (ZANAFLEX) 4 MG tablet    Sig: Take 1 tablet (4 mg total) by mouth every 8 (eight) hours as needed for muscle spasms.    Dispense:  90 tablet    Refill:  11   nortriptyline (PAMELOR) 25 MG capsule    Sig: Take 2 capsules (50 mg total) by mouth at bedtime.    Dispense:  60 capsule    Refill:  5   DULoxetine (CYMBALTA) 60 MG capsule    Sig: Take 1 capsule (60 mg total) by mouth daily after breakfast.    Dispense:  30 capsule    Refill:  5  Recommend magnesium 500 to 1000 mg nightly for muscle cramps.  Instructed to not take this with tizanidine.   No orders of the defined types were placed in this encounter.    Follow-up plan:   Return in about 3 months (around 04/20/2022) for Medication Management, in person.     Diagnostic cervical facet medial branch nerve blocks C3-C7 b/l 05/06/19- did not help Diagnostic thoracic facet medial branch nerve blocks T1-T2, consider bilateral occipital nerve block  Cervical/trapezius trigger point injections SPG block                 Recent Visits Date Type Provider Dept  12/11/21 Procedure visit Gillis Santa, MD [facility]  Showing recent visits within past 90 days and meeting all other requirements Today's Visits Date Type Provider Dept  01/18/22 Office Visit Gillis Santa, MD [facility]  Showing today's visits and meeting all other requirements Future Appointments Date Type Provider Dept  04/12/22 Appointment Gillis Santa, MD [facility]  Showing future appointments within next 90  days and meeting all other requirements  I discussed the assessment and treatment plan with the patient. The patient was provided an opportunity to ask questions and all were answered. The patient agreed with the plan and demonstrated an understanding of the instructions.  Patient advised to call back or seek an in-person evaluation if the symptoms or condition worsens.  Duration of encounter: 30 minutes.  Note by: Gillis Santa, MD Date: 01/18/2022; Time: 12:00 PM

## 2022-01-22 ENCOUNTER — Ambulatory Visit: Payer: Medicare Other

## 2022-01-24 ENCOUNTER — Ambulatory Visit: Payer: Medicare Other

## 2022-01-25 ENCOUNTER — Telehealth: Payer: Self-pay | Admitting: Student in an Organized Health Care Education/Training Program

## 2022-01-25 NOTE — Telephone Encounter (Signed)
PT stated that he has an unexpected trip that he will be leaving on tomorrow around lunch time. PT wants to see if doctor Holley Raring will give the pharmacy the approve to fill his prescription today so he will have medications before he leave. Please give patient a call. Thanks

## 2022-01-25 NOTE — Telephone Encounter (Signed)
Spoke with Thomas Cole to let him know that I did check the PMP and his last fill on both medications was 12/25/21.  I did call [place] and approve the fill today, since he is going out of town.  Next fill should be 02/24/22, Rx is written for 02/25/22.

## 2022-01-26 ENCOUNTER — Ambulatory Visit: Payer: Medicare Other | Admitting: Physical Therapy

## 2022-01-30 ENCOUNTER — Telehealth: Payer: Self-pay | Admitting: *Deleted

## 2022-01-30 ENCOUNTER — Ambulatory Visit: Payer: Medicare Other

## 2022-01-30 NOTE — Telephone Encounter (Signed)
Phone call to follow up with patient about scheduling visits for Diabetes education. Left a message to call back.

## 2022-02-01 ENCOUNTER — Ambulatory Visit: Payer: Medicare Other

## 2022-02-05 ENCOUNTER — Ambulatory Visit: Payer: Medicare Other

## 2022-02-07 ENCOUNTER — Ambulatory Visit: Payer: Medicare Other

## 2022-02-12 ENCOUNTER — Ambulatory Visit: Payer: Medicare Other

## 2022-02-14 ENCOUNTER — Ambulatory Visit: Payer: Medicare Other

## 2022-02-19 ENCOUNTER — Ambulatory Visit: Payer: Medicare Other

## 2022-02-21 ENCOUNTER — Ambulatory Visit: Payer: Medicare Other

## 2022-02-26 ENCOUNTER — Ambulatory Visit: Payer: Medicare Other

## 2022-02-28 ENCOUNTER — Ambulatory Visit: Payer: Medicare Other

## 2022-03-01 ENCOUNTER — Encounter: Payer: Self-pay | Admitting: *Deleted

## 2022-03-01 ENCOUNTER — Encounter: Payer: Medicare Other | Attending: Family Medicine | Admitting: *Deleted

## 2022-03-01 VITALS — Wt 315.5 lb

## 2022-03-01 DIAGNOSIS — E119 Type 2 diabetes mellitus without complications: Secondary | ICD-10-CM | POA: Diagnosis not present

## 2022-03-01 NOTE — Progress Notes (Signed)
Appt. Start Time: 1515 Appt. End Time: 1620  Class 2 Psychosocial - identify DM as a source of stress; state the effects of stress on BG control  Exercise - describe the effects of exercise on blood glucose and importance of regular exercise in controlling diabetes; state a plan for personal exercise; verbalize contraindications for exercise  Self-Monitoring - state importance of SMBG; use SMBG results to effectively manage diabetes; identify importance of regular HbA1C testing and goals for results  Acute Complications - recognize hyperglycemia and hypoglycemia with causes and effects; identify blood glucose results as high, low or in control; list steps in treating and preventing high and low blood glucose  Sick Day Guidelines: state appropriate measure to manage blood glucose when ill (need for meds, HBGM plan, when to call physician, need for fluids)  Chronic Complications/Foot, Skin, Eye Dental Care - identify possible long-term complications of diabetes (retinopathy, neuropathy, nephropathy, cardiovascular disease, infections); explain steps in prevention and treatment of chronic complications; state importance of daily self-foot exams; describe how to examine feet and what to look for; explain appropriate eye and dental care  Lifestyle Changes/Goals - state benefits of making appropriate lifestyle changes; identify habits that need to change (meals, tobacco, alcohol); identify strategies to reduce risk factors for personal health  Pregnancy/Sexual Health - state importance of good blood glucose control in preventing sexual problems (impotence, vaginal dryness, infections, loss of desire)  Teaching Materials Used: Class 2 Slide Packet A1C Pamphlet Foot Care Literature Kidney Test Handout Stroke Card Goals for Class 2

## 2022-04-12 ENCOUNTER — Encounter: Payer: Medicare Other | Admitting: Student in an Organized Health Care Education/Training Program

## 2022-04-24 ENCOUNTER — Encounter: Payer: Self-pay | Admitting: Student in an Organized Health Care Education/Training Program

## 2022-04-24 ENCOUNTER — Ambulatory Visit
Payer: Medicare Other | Attending: Student in an Organized Health Care Education/Training Program | Admitting: Student in an Organized Health Care Education/Training Program

## 2022-04-24 VITALS — BP 135/98 | HR 99 | Temp 98.2°F | Resp 16 | Ht 75.0 in | Wt 312.0 lb

## 2022-04-24 DIAGNOSIS — M5412 Radiculopathy, cervical region: Secondary | ICD-10-CM | POA: Insufficient documentation

## 2022-04-24 DIAGNOSIS — Z9889 Other specified postprocedural states: Secondary | ICD-10-CM | POA: Insufficient documentation

## 2022-04-24 DIAGNOSIS — M48061 Spinal stenosis, lumbar region without neurogenic claudication: Secondary | ICD-10-CM | POA: Diagnosis not present

## 2022-04-24 DIAGNOSIS — G8929 Other chronic pain: Secondary | ICD-10-CM | POA: Insufficient documentation

## 2022-04-24 DIAGNOSIS — M5414 Radiculopathy, thoracic region: Secondary | ICD-10-CM | POA: Insufficient documentation

## 2022-04-24 DIAGNOSIS — M5416 Radiculopathy, lumbar region: Secondary | ICD-10-CM | POA: Diagnosis not present

## 2022-04-24 DIAGNOSIS — G894 Chronic pain syndrome: Secondary | ICD-10-CM | POA: Diagnosis not present

## 2022-04-24 DIAGNOSIS — G959 Disease of spinal cord, unspecified: Secondary | ICD-10-CM | POA: Diagnosis present

## 2022-04-24 DIAGNOSIS — M542 Cervicalgia: Secondary | ICD-10-CM | POA: Insufficient documentation

## 2022-04-24 MED ORDER — HYDROCODONE BITARTRATE ER 10 MG PO CP12: 10.0000 mg | ORAL_CAPSULE | Freq: Two times a day (BID) | ORAL | 0 refills | Status: AC

## 2022-04-24 MED ORDER — HYDROCODONE-ACETAMINOPHEN 10-325 MG PO TABS: 1.0000 | ORAL_TABLET | Freq: Three times a day (TID) | ORAL | 0 refills | Status: DC | PRN

## 2022-04-24 MED ORDER — HYDROCODONE BITARTRATE ER 10 MG PO CP12: 10.0000 mg | ORAL_CAPSULE | Freq: Two times a day (BID) | ORAL | 0 refills | Status: DC

## 2022-04-24 MED ORDER — PREGABALIN 100 MG PO CAPS: 100.0000 mg | ORAL_CAPSULE | Freq: Three times a day (TID) | ORAL | 5 refills | Status: DC

## 2022-04-24 NOTE — Progress Notes (Signed)
Nursing Pain Medication Assessment:  Safety precautions to be maintained throughout the outpatient stay will include: orient to surroundings, keep bed in low position, maintain call bell within reach at all times, provide assistance with transfer out of bed and ambulation.  Nursing Pain Medication Assessment:  Safety precautions to be maintained throughout the outpatient stay will include: orient to surroundings, keep bed in low position, maintain call bell within reach at all times, provide assistance with transfer out of bed and ambulation.  Medication Inspection Compliance: Pill count conducted under aseptic conditions, in front of the patient. Neither the pills nor the bottle was removed from the patient's sight at any time. Once count was completed pills were immediately returned to the patient in their original bottle.  Medication #1: Hydrocodone/APAP Pill/Patch Count: unable to count, pills in pill pack, not labeled Pill/Patch Appearance: Markings consistent with prescribed medication Bottle Appearance: Standard pharmacy container. Clearly labeled. Filled Date: 31 / 22 / 2023 Last Medication intake:  Today  Medication #2:  Hydrocodone Bitatrate  Pill/Patch Count: Unable to count, pills in pill packs, not labeled Pill/Patch Appearance: Markings consistent with prescribed medication Bottle Appearance: Standard pharmacy container. Clearly labeled. Filled Date: 79 / 22 / 2023 Last Medication intake:  Today

## 2022-04-24 NOTE — Patient Instructions (Signed)
Pain Management Discharge Instructions  General Discharge Instructions :  If you need to reach your doctor call: Monday-Friday 8:00 am - 4:00 pm at [phone] or toll free [phone].  After clinic hours [phone] to have operator reach doctor.  Bring all of your medication bottles to all your appointments in the pain clinic.  To cancel or reschedule your appointment with Pain Management please remember to call 24 hours in advance to avoid a fee.  Refer to the educational materials which you have been given on: General Risks, I had my Procedure. Discharge Instructions, Post Sedation.  Post Procedure Instructions:  The drugs you were given will stay in your system until tomorrow, so for the next 24 hours you should not drive, make any legal decisions or drink any alcoholic beverages.  You may eat anything you prefer, but it is better to start with liquids then soups and crackers, and gradually work up to solid foods.  Please notify your doctor immediately if you have any unusual bleeding, trouble breathing or pain that is not related to your normal pain.  Depending on the type of procedure that was done, some parts of your body may feel week and/or numb.  This usually clears up by tonight or the next day.  Walk with the use of an assistive device or accompanied by an adult for the 24 hours.  You may use ice on the affected area for the first 24 hours.  Put ice in a Ziploc bag and cover with a towel and place against area 15 minutes on 15 minutes off.  You may switch to heat after 24 hours.Epidural Steroid Injection Patient Information  Description: The epidural space surrounds the nerves as they exit the spinal cord.  In some patients, the nerves can be compressed and inflamed by a bulging disc or a tight spinal canal (spinal stenosis).  By injecting steroids into the epidural space, we can bring irritated nerves into direct contact with a potentially helpful medication.  These  steroids act directly on the irritated nerves and can reduce swelling and inflammation which often leads to decreased pain.  Epidural steroids may be injected anywhere along the spine and from the neck to the low back depending upon the location of your pain.   After numbing the skin with local anesthetic (like Novocaine), a small needle is passed into the epidural space slowly.  You may experience a sensation of pressure while this is being done.  The entire block usually last less than 10 minutes.  Conditions which may be treated by epidural steroids:  Low back and leg pain Neck and arm pain Spinal stenosis Post-laminectomy syndrome Herpes zoster (shingles) pain Pain from compression fractures  Preparation for the injection:  Do not eat any solid food or dairy products within 8 hours of your appointment.  You may drink clear liquids up to 3 hours before appointment.  Clear liquids include water, black coffee, juice or soda.  No milk or cream please. You may take your regular medication, including pain medications, with a sip of water before your appointment  Diabetics should hold regular insulin (if taken separately) and take 1/2 normal NPH dos the morning of the procedure.  Carry some sugar containing items with you to your appointment. A driver must accompany you and be prepared to drive you home after your procedure.  Bring all your current medications with your. An IV may be inserted and sedation may be given at the discretion of the physician.  A blood pressure cuff, EKG and other monitors will often be applied during the procedure.  Some patients may need to have extra oxygen administered for a short period. You will be asked to provide medical information, including your allergies, prior to the procedure.  We must know immediately if you are taking blood thinners (like Coumadin/Warfarin)  Or if you are allergic to IV iodine contrast (dye). We must know if you could possible be  pregnant.  Possible side-effects: Bleeding from needle site Infection (rare, may require surgery) Nerve injury (rare) Numbness & tingling (temporary) Difficulty urinating (rare, temporary) Spinal headache ( a headache worse with upright posture) Light -headedness (temporary) Pain at injection site (several days) Decreased blood pressure (temporary) Weakness in arm/leg (temporary) Pressure sensation in back/neck (temporary)  Call if you experience: Fever/chills associated with headache or increased back/neck pain. Headache worsened by an upright position. New onset weakness or numbness of an extremity below the injection site Hives or difficulty breathing (go to the emergency room) Inflammation or drainage at the infection site Severe back/neck pain Any new symptoms which are concerning to you  Please note:  Although the local anesthetic injected can often make your back or neck feel good for several hours after the injection, the pain will likely return.  It takes 3-7 days for steroids to work in the epidural space.  You may not notice any pain relief for at least that one week.  If effective, we will often do a series of three injections spaced 3-6 weeks apart to maximally decrease your pain.  After the initial series, we generally will wait several months before considering a repeat injection of the same type.  If you have any questions, please call [phone] [facility]

## 2022-04-24 NOTE — Progress Notes (Signed)
PROVIDER NOTE: Information contained herein reflects review and annotations entered in association with encounter. Interpretation of such information and data should be left to medically-trained personnel. Information provided to patient can be located elsewhere in the medical record under "Patient Instructions". Document created using STT-dictation technology, any transcriptional errors that may result from process are unintentional.    Patient: Thomas Cole  Service Category: E/M  Provider: Gillis Santa, MD  DOB: October 02, 1970  DOS: 04/24/2022  Specialty: Interventional Pain Management  MRN: [id number]  Setting: Ambulatory outpatient  PCP: Derinda Late, MD  Type: Established Patient    Referring Provider: Derinda Late, MD  Location: Office  Delivery: Face-to-face     HPI  Mr. FARAAZ WOLIN, a 52 y.o. year old male, is here today because of his Lumbar radiculopathy [M54.16]. Mr. Lecomte primary complain today is Neck Pain and Back Pain  Last encounter: My last encounter with him was on 01/18/22  Pertinent problems: Mr. Kilpatrick has Spondylosis, cervical, with myelopathy; Migraines; History of seizures; Generalized anxiety disorder; and Bilateral occipital neuralgia on their pertinent problem list. Pain Assessment: Severity of Chronic pain is reported as a 7 /10. Location: Neck  /head, left arm. Onset: More than a month ago. Quality: Hervey Ard, Aching. Timing: Constant. Modifying factor(s): medications. Vitals:  height is '6\' 3"'$  (1.905 m) and weight is 312 lb (141.5 kg) (abnormal). His temporal temperature is 98.2 F (36.8 C). His blood pressure is 135/98 (abnormal) and his pulse is 99. His respiration is 16 and oxygen saturation is 100%.   Reason for encounter: medication management.  Keval presents today for medication management.  No significant change in his medical history other than increased left low back pain with radiation into his left hip and left thigh in a dermatomal fashion.  His previous  lumbar epidural steroid injection was 12/11/2021 that provided him with 75% pain relief for approximately 5 &1/2 months with gradual return of his pain thereafter.  He states that his last injection was very helpful and given return of his pain and associated disability, he would like to repeat lumbar epidural steroid injection.  Otherwise we will refill his chronic pain medications as below.  He does find benefit with magnesium in regards to his lumbar paraspinal muscle spasms.  UDS up-to-date and appropriate.  Pharmacotherapy Assessment  Analgesic: Hydrocodone extended release 10 mg twice daily, hydrocodone immediate release 10 mg 3 times daily as needed for breakthrough pain    Monitoring:Hydrocodone extended release 10 mg twice daily, hydrocodone immediate release 10 mg 3 times daily as needed for breakthrough pain   [place] PMP: PDMP reviewed during this encounter.       Pharmacotherapy: No side-effects or adverse reactions reported. Compliance: No problems identified. Effectiveness: Clinically acceptable.  UDS:  Summary  Date Value Ref Range Status  10/17/2021 Note  Final    Comment:    ==================================================================== Compliance Drug Analysis, Ur ==================================================================== Test                             Result       Flag       Units  Drug Present and Declared for Prescription Verification   7-aminoclonazepam              96           EXPECTED   ng/mg creat    7-aminoclonazepam is an expected metabolite of clonazepam. Source of    clonazepam is a scheduled  prescription medication.    Hydrocodone                    758          EXPECTED   ng/mg creat   Hydromorphone                  87           EXPECTED   ng/mg creat   Dihydrocodeine                 263          EXPECTED   ng/mg creat   Norhydrocodone                 1508         EXPECTED   ng/mg creat    Sources of hydrocodone include scheduled prescription  medications.    Hydromorphone, dihydrocodeine and norhydrocodone are expected    metabolites of hydrocodone. Hydromorphone and dihydrocodeine are    also available as scheduled prescription medications.    Pregabalin                     PRESENT      EXPECTED   Tizanidine                     PRESENT      EXPECTED   Duloxetine                     PRESENT      EXPECTED   Nortriptyline                  PRESENT      EXPECTED    Nortriptyline may be administered as a prescription drug; it is also    an expected metabolite of amitriptyline.    Acetaminophen                  PRESENT      EXPECTED  Drug Absent but Declared for Prescription Verification   Phenytoin                      Not Detected UNEXPECTED   Metoprolol                     Not Detected UNEXPECTED ==================================================================== Test                      Result    Flag   Units      Ref Range   Creatinine              76               mg/dL      >=20 ==================================================================== Declared Medications:  The flagging and interpretation on this report are based on the  following declared medications.  Unexpected results may arise from  inaccuracies in the declared medications.   **Note: The testing scope of this panel includes these medications:   Clonazepam (Klonopin)  Duloxetine (Cymbalta)  Hydrocodone  Hydrocodone (Norco)  Metoprolol (Toprol)  Nortriptyline (Pamelor)  Phenytoin (Dilantin)  Pregabalin (Lyrica)   **Note: The testing scope of this panel does not include small to  moderate amounts of these reported medications:   Acetaminophen (Norco)  Tizanidine (Zanaflex)   **Note: The testing scope of this panel does not include the  following reported medications:  Carbidopa (Sinemet)  Cyanocobalamin  Furosemide (Lasix)  Glipizide  Hydrochlorothiazide (Hydrodiuril)  Levodopa (Sinemet)  Losartan (Cozaar)  Metformin (Glucophage)   Omeprazole (Prilosec)  Ubrogepant Roselyn Meier)  Vitamin D3 ==================================================================== For clinical consultation, please call [phone]. ====================================================================       ROS  Constitutional: Denies any fever or chills Gastrointestinal: No reported hemesis, hematochezia, vomiting, or acute GI distress Musculoskeletal:  cervicalgia  L>right hip pain; low back pain with radiation into left leg improved  Neurological: No reported episodes of acute onset apraxia, aphasia, dysarthria, agnosia, amnesia, paralysis, loss of coordination, or loss of consciousness  Medication Review  DULoxetine, HYDROcodone Bitartrate ER, HYDROcodone-acetaminophen, Ubrogepant, Vitamin D3, carbidopa-levodopa, clonazePAM, cyanocobalamin, hydrochlorothiazide, losartan, metFORMIN, metoprolol succinate, nortriptyline, omeprazole, phenytoin, pregabalin, sildenafil, and tiZANidine  History Review  Allergy: Mr. Lackey is allergic to amitriptyline, bee venom, chlorhexidine, carbamazepine, meloxicam, morphine and related, and sulfa antibiotics. Drug: Mr. Enriquez  reports no history of drug use. Alcohol:  reports no history of alcohol use. Tobacco:  reports that he has never smoked. He has never used smokeless tobacco. Social: Mr. Gharibian  reports that he has never smoked. He has never used smokeless tobacco. He reports that he does not drink alcohol and does not use drugs. Medical:  has a past medical history of Anxiety, Asthma, Back problem, Benign essential hypertension (11/30/2016), Cervical spondylosis without myelopathy (5/95/6387), Complication of anesthesia, Depression, Diabetes mellitus without complication ([place]) (56/4332), GERD (gastroesophageal reflux disease), Hypertension, Labile hypertension (03/19/2013), Major depressive disorder, recurrent episode, severe ([place]) (06/23/2015), Migraine headache, Migraines (11/11/2013), S/P appendectomy  (04/09/2011), Seizure ([place]) (11/30/2016), Seizures ([place]), Shortness of breath, and SOB (shortness of breath) (08/06/2014). Surgical: Mr. Bellerose  has a past surgical history that includes Knee surgery; Posterior fusion cervical spine; Cervical disc surgery; Appendectomy (12); Anterior cervical corpectomy (N/A, 09/15/2012); Cholecystectomy (N/A, 03/29/2017); Colonoscopy with propofol (N/A, 10/23/2019); and Esophagogastroduodenoscopy (egd) with propofol (N/A, 10/23/2019). Family: family history includes Arthritis in his father, maternal grandfather, maternal grandmother, mother, paternal grandfather, and paternal grandmother; Asthma in his mother; Coronary artery disease in his father and mother; Diabetes in his father, paternal grandfather, and paternal grandmother; Heart disease in his father, maternal grandfather, maternal grandmother, paternal grandfather, and paternal grandmother; Hypertension in his maternal grandfather, maternal grandmother, mother, paternal grandfather, and paternal grandmother; Mental illness in his mother.  Laboratory Chemistry Profile   Renal Lab Results  Component Value Date   BUN 13 04/21/2017   CREATININE 0.93 04/21/2017   LABCREA 79.3 03/05/2014   GFR 82.19 08/05/2014   GFRAA >60 04/21/2017   GFRNONAA >60 04/21/2017     Hepatic Lab Results  Component Value Date   AST 30 04/21/2017   ALT 23 04/21/2017   ALBUMIN 4.2 04/21/2017   ALKPHOS 86 04/21/2017   LIPASE 51 04/21/2017     Electrolytes Lab Results  Component Value Date   NA 138 04/21/2017   K 3.7 04/21/2017   CL 101 04/21/2017   CALCIUM 9.4 04/21/2017   MG 1.9 04/26/2020     Bone Lab Results  Component Value Date   VD25OH 31.95 03/05/2016   25OHVITD1 35 04/26/2020   25OHVITD2 <1.0 04/26/2020   25OHVITD3 35 04/26/2020     Inflammation (CRP: Acute Phase) (ESR: Chronic Phase) Lab Results  Component Value Date   CRP 10 04/26/2020   ESRSEDRATE 22 04/26/2020       Note: Above Lab results  reviewed.   Physical Exam  General appearance: Well nourished, well developed, and well hydrated. In no apparent acute distress Mental status: Alert,  oriented x 3 (person, place, & time)       Respiratory: No evidence of acute respiratory distress Eyes: PERLA Vitals: BP (!) 135/98   Pulse 99   Temp 98.2 F (36.8 C) (Temporal)   Resp 16   Ht '6\' 3"'$  (1.905 m)   Wt (!) 312 lb (141.5 kg)   SpO2 100%   BMI 39.00 kg/m  BMI: Estimated body mass index is 39 kg/m as calculated from the following:   Height as of this encounter: '6\' 3"'$  (1.905 m).   Weight as of this encounter: 312 lb (141.5 kg). Ideal: Ideal body weight: 84.5 kg (186 lb 4.6 oz) Adjusted ideal body weight: 107.3 kg (236 lb 9.2 oz)  Cervical Spine Area Exam  Skin & Axial Inspection: Well healed scar from previous spine surgery detected Alignment: Symmetrical Functional ROM: Pain restricted ROM, bilaterally Stability: No instability detected Muscle Tone/Strength: Functionally intact. No obvious neuro-muscular anomalies detected. Sensory (Neurological): Dermatomal pain pattern and neurogenic Palpation: No palpable anomalies               Upper Extremity (UE) Exam      Side: Right upper extremity   Side: Left upper extremity  Skin & Extremity Inspection: Skin color, temperature, and hair growth are WNL. No peripheral edema or cyanosis. No masses, redness, swelling, asymmetry, or associated skin lesions. No contractures.   Skin & Extremity Inspection: Skin color, temperature, and hair growth are WNL. No peripheral edema or cyanosis. No masses, redness, swelling, asymmetry, or associated skin lesions. No contractures.  Functional ROM: Pain restricted ROM for shoulder and elbow   Functional ROM: Pain restricted ROM for shoulder and elbow  Muscle Tone/Strength: Functionally intact. No obvious neuro-muscular anomalies detected.   Muscle Tone/Strength: Functionally intact. No obvious neuro-muscular anomalies detected.  Sensory  (Neurological): Unimpaired           Sensory (Neurological): Unimpaired          Palpation: No palpable anomalies               Palpation: No palpable anomalies              Provocative Test(s):  Phalen's test: deferred Tinel's test: deferred Apley's scratch test (touch opposite shoulder):  Action 1 (Across chest): Decreased ROM Action 2 (Overhead): Decreased ROM Action 3 (LB reach): Decreased ROM       Provocative Test(s):  Phalen's test: deferred Tinel's test: deferred Apley's scratch test (touch opposite shoulder):  Action 1 (Across chest): Decreased ROM Action 2 (Overhead): Decreased ROM Action 3 (LB reach): Decreased ROM     Lumbar Spine Area Exam  Skin & Axial Inspection: No masses, redness, or swelling Alignment: Symmetrical Functional ROM: Pain restricted ROM Stability: No instability detected Muscle Tone/Strength: Functionally intact. No obvious neuro-muscular anomalies detected. Sensory (Neurological): Dermatomal pain pattern left greater than right   Gait & Posture Assessment  Ambulation: Patient came in today in a wheel chair Gait: Antalgic gait (limping) Posture: Difficulty standing up straight, due to pain  Lower Extremity Exam    Side: Right lower extremity  Side: Left lower extremity  Stability: No instability observed          Stability: No instability observed          Skin & Extremity Inspection: Skin color, temperature, and hair growth are WNL. No peripheral edema or cyanosis. No masses, redness, swelling, asymmetry, or associated skin lesions. No contractures.  Skin & Extremity Inspection: Skin color, temperature, and hair growth are WNL. No  peripheral edema or cyanosis. No masses, redness, swelling, asymmetry, or associated skin lesions. No contractures.  Functional ROM: Unrestricted ROM                  Functional ROM: Pain restricted ROM for hip and knee joints, i Adequate SLR (straight leg raise)  Muscle Tone/Strength: Functionally intact. No obvious  neuro-muscular anomalies detected.  Muscle Tone/Strength: Functionally intact. No obvious neuro-muscular anomalies detected.  Sensory (Neurological): Unimpaired        Sensory (Neurological): Dermatomal pain pattern        DTR: Patellar: deferred today Achilles: deferred today Plantar: deferred today  DTR: Patellar: deferred today Achilles: deferred today Plantar: deferred today  Palpation: No palpable anomalies  Palpation: No palpable anomalies     Assessment   Status Diagnosis  Having a Flare-up Having a Flare-up Having a Flare-up 1. Lumbar radiculopathy (L>R)   2. Chronic radicular lumbar pain   3. Neuroforaminal stenosis of lumbar spine (L3,4,5)   4. History of lumbar surgery (1994 L4/5 discetomy)   5. Chronic pain syndrome   6. Thoracic radiculopathy   7. Myelopathy of cervical spinal cord with cervical radiculopathy (HCC)   8. Cervicalgia        Plan of Care  Mr. ZEDDIE NJIE has a current medication list which includes the following long-term medication(s): carbidopa-levodopa, carbidopa-levodopa, clonazepam, duloxetine, metformin, metoprolol succinate, nortriptyline, sildenafil, [START ON 04/26/2022] hydrocodone-acetaminophen, [START ON 05/26/2022] hydrocodone-acetaminophen, [START ON 06/25/2022] hydrocodone-acetaminophen, and pregabalin.  Pharmacotherapy (Medications Ordered): Meds ordered this encounter  Medications   HYDROcodone-acetaminophen (NORCO) 10-325 MG tablet    Sig: Take 1 tablet by mouth every 8 (eight) hours as needed. For chronic pain syndrome. Each Rx to last 30 days.    Dispense:  90 tablet    Refill:  0   HYDROcodone-acetaminophen (NORCO) 10-325 MG tablet    Sig: Take 1 tablet by mouth every 8 (eight) hours as needed. For chronic pain syndrome. Each Rx to last 30 days.    Dispense:  90 tablet    Refill:  0   HYDROcodone-acetaminophen (NORCO) 10-325 MG tablet    Sig: Take 1 tablet by mouth every 8 (eight) hours as needed. For chronic pain syndrome.  Each Rx to last 30 days.    Dispense:  90 tablet    Refill:  0   HYDROcodone Bitartrate ER 10 MG CP12    Sig: Take 10 mg by mouth every 12 (twelve) hours. Must last 30 days.    Dispense:  60 capsule    Refill:  0    Chronic Pain: STOP Act (Not applicable) Fill 1 day early if closed on refill date. Avoid benzodiazepines within 8 hours of opioids   HYDROcodone Bitartrate ER 10 MG CP12    Sig: Take 10 mg by mouth every 12 (twelve) hours. Must last 30 days.    Dispense:  60 capsule    Refill:  0    Chronic Pain: STOP Act (Not applicable) Fill 1 day early if closed on refill date. Avoid benzodiazepines within 8 hours of opioids   HYDROcodone Bitartrate ER 10 MG CP12    Sig: Take 10 mg by mouth every 12 (twelve) hours. Must last 30 days.    Dispense:  60 capsule    Refill:  0    Chronic Pain: STOP Act (Not applicable) Fill 1 day early if closed on refill date. Avoid benzodiazepines within 8 hours of opioids   pregabalin (LYRICA) 100 MG capsule    Sig: Take  1 capsule (100 mg total) by mouth 3 (three) times daily.    Dispense:  90 capsule    Refill:  5    Do not place this medication, or any other prescription from our practice, on "Automatic Refill". Patient may have prescription filled one day early if pharmacy is closed on scheduled refill date.  Continue magnesium 500 to 1000 mg nightly for muscle cramps.  Instructed to not take this with tizanidine.   Orders Placed This Encounter  Procedures   Lumbar Epidural Injection    Standing Status:   Future    Standing Expiration Date:   07/24/2022    Scheduling Instructions:     Procedure: Interlaminar Lumbar Epidural Steroid injection (LESI)            Laterality: LEFt L3/4     Sedation: PO Valium     Timeframe: ASAA    Order Specific Question:   Where will this procedure be performed?    Answer:   [facility]   TRIGGER POINT INJECTION    Standing Status:   Future    Standing Expiration Date:   07/24/2022    Scheduling  Instructions:     Cervical/trapezius TPI    Order Specific Question:   Where will this procedure be performed?    Answer:   [facility]     Follow-up plan:   Return in about 2 weeks (around 05/08/2022) for L3/4 ESI + Cervical TPI , in clinic (PO 10 mg Valium).    Recent Visits No visits were found meeting these conditions. Showing recent visits within past 90 days and meeting all other requirements Today's Visits Date Type Provider Dept  04/24/22 Office Visit Gillis Santa, MD [facility]  Showing today's visits and meeting all other requirements Future Appointments No visits were found meeting these conditions. Showing future appointments within next 90 days and meeting all other requirements  I discussed the assessment and treatment plan with the patient. The patient was provided an opportunity to ask questions and all were answered. The patient agreed with the plan and demonstrated an understanding of the instructions.  Patient advised to call back or seek an in-person evaluation if the symptoms or condition worsens.  Duration of encounter: 30 minutes.  Note by: Gillis Santa, MD Date: 04/24/2022; Time: 3:45 PM

## 2022-04-26 ENCOUNTER — Encounter: Payer: Medicare Other | Attending: Family Medicine | Admitting: Dietician

## 2022-04-26 ENCOUNTER — Encounter: Payer: Self-pay | Admitting: Dietician

## 2022-04-26 DIAGNOSIS — E119 Type 2 diabetes mellitus without complications: Secondary | ICD-10-CM | POA: Insufficient documentation

## 2022-04-26 NOTE — Progress Notes (Signed)
Appt. Start Time: 1645 Appt. End Time: 1800  Class 3  Nutritional Management - use food labels to identify serving size, content of carbohydrate, fiber, protein, fat, saturated fat and sodium; recognize food sources of fat, saturated fat, trans fat, and sodium, and verbalize goals for intake; describe healthful, appropriate food choices when dining out   Exercise - state a plan for personal exercise; verbalize contraindications for exercise  Self-Monitoring - state importance of SMBG; use SMBG results to effectively manage diabetes; identify importance of regular HbA1C testing and goals for results   Teaching Materials Used: Class 3 Slide Packet Quick and Balanced Meal Ideas Diabetes-Friendly Menus and Recipes

## 2022-04-29 DIAGNOSIS — E1169 Type 2 diabetes mellitus with other specified complication: Secondary | ICD-10-CM | POA: Insufficient documentation

## 2022-05-14 ENCOUNTER — Ambulatory Visit
Admission: RE | Admit: 2022-05-14 | Discharge: 2022-05-14 | Disposition: A | Discharge status: Home / Self Care | Payer: Medicare Other | Source: Ambulatory Visit | Attending: Student in an Organized Health Care Education/Training Program | Admitting: Student in an Organized Health Care Education/Training Program

## 2022-05-14 ENCOUNTER — Ambulatory Visit
Payer: Medicare Other | Attending: Student in an Organized Health Care Education/Training Program | Admitting: Student in an Organized Health Care Education/Training Program

## 2022-05-14 ENCOUNTER — Encounter: Payer: Self-pay | Admitting: Student in an Organized Health Care Education/Training Program

## 2022-05-14 VITALS — BP 139/92 | HR 97 | Temp 97.5°F | Resp 13 | Ht 75.0 in | Wt 310.0 lb

## 2022-05-14 DIAGNOSIS — M5416 Radiculopathy, lumbar region: Secondary | ICD-10-CM

## 2022-05-14 DIAGNOSIS — M542 Cervicalgia: Secondary | ICD-10-CM | POA: Diagnosis not present

## 2022-05-14 DIAGNOSIS — M545 Low back pain, unspecified: Secondary | ICD-10-CM | POA: Diagnosis present

## 2022-05-14 DIAGNOSIS — M48061 Spinal stenosis, lumbar region without neurogenic claudication: Secondary | ICD-10-CM | POA: Diagnosis not present

## 2022-05-14 DIAGNOSIS — G8929 Other chronic pain: Secondary | ICD-10-CM

## 2022-05-14 DIAGNOSIS — G894 Chronic pain syndrome: Secondary | ICD-10-CM | POA: Diagnosis not present

## 2022-05-14 MED ORDER — DIAZEPAM 5 MG PO TABS
5.0000 mg | ORAL_TABLET | ORAL | Status: AC
  Administered 2022-05-14: 5 mg via ORAL

## 2022-05-14 MED ORDER — SODIUM CHLORIDE 0.9% FLUSH
2.0000 mL | Freq: Once | INTRAVENOUS | Status: AC
  Administered 2022-05-14: 2 mL

## 2022-05-14 MED ORDER — DEXAMETHASONE SODIUM PHOSPHATE 10 MG/ML IJ SOLN
INTRAMUSCULAR | Status: AC
  Filled 2022-05-14: qty 1

## 2022-05-14 MED ORDER — IOHEXOL 180 MG/ML  SOLN
INTRAMUSCULAR | Status: AC
  Filled 2022-05-14: qty 20

## 2022-05-14 MED ORDER — LIDOCAINE HCL 2 % IJ SOLN
20.0000 mL | Freq: Once | INTRAMUSCULAR | Status: AC
  Administered 2022-05-14: 100 mg

## 2022-05-14 MED ORDER — DIAZEPAM 5 MG PO TABS
ORAL_TABLET | ORAL | Status: AC
  Filled 2022-05-14: qty 1

## 2022-05-14 MED ORDER — SODIUM CHLORIDE (PF) 0.9 % IJ SOLN
INTRAMUSCULAR | Status: AC
  Filled 2022-05-14: qty 10

## 2022-05-14 MED ORDER — LIDOCAINE HCL (PF) 2 % IJ SOLN
INTRAMUSCULAR | Status: AC
  Filled 2022-05-14: qty 5

## 2022-05-14 MED ORDER — DEXAMETHASONE SODIUM PHOSPHATE 10 MG/ML IJ SOLN
10.0000 mg | Freq: Once | INTRAMUSCULAR | Status: AC
  Administered 2022-05-14: 10 mg

## 2022-05-14 MED ORDER — IOHEXOL 180 MG/ML  SOLN
10.0000 mL | Freq: Once | INTRAMUSCULAR | Status: AC
  Administered 2022-05-14: 10 mL via EPIDURAL

## 2022-05-14 MED ORDER — ROPIVACAINE HCL 2 MG/ML IJ SOLN
INTRAMUSCULAR | Status: AC
  Filled 2022-05-14: qty 20

## 2022-05-14 MED ORDER — ROPIVACAINE HCL 2 MG/ML IJ SOLN
2.0000 mL | Freq: Once | INTRAMUSCULAR | Status: AC
  Administered 2022-05-14: 2 mL via EPIDURAL

## 2022-05-14 NOTE — Patient Instructions (Signed)

## 2022-05-14 NOTE — Progress Notes (Signed)
[facility];PROVIDER NOTE: Interpretation of information contained herein should be left to medically-trained personnel. Specific patient instructions are provided elsewhere under "Patient Instructions" section of medical record. This document was created in part using STT-dictation technology, any transcriptional errors that may result from this process are unintentional.  Patient: Thomas Cole Type: Established DOB: 1971-02-11 MRN: [id number] PCP: Derinda Late, MD  Service: Procedure DOS: 05/14/2022 Setting: Ambulatory Location: Ambulatory outpatient facility Delivery: Face-to-face Provider: Gillis Santa, MD Specialty: Interventional Pain Management Specialty designation: 09 Location: Outpatient facility Ref. Prov.: Derinda Late, MD    Primary Reason for Visit: Interventional Pain Management Treatment. CC: Back Pain (lower) and Neck Pain   Procedure:           Type: Lumbar epidural steroid injection (LESI) (interlaminar) #1 (2024) and bilateral cervical trigger point injections done, for myofascial pain syndrome of cervical spine and trapezius Laterality: Left   Level:  L3-4 Level.  Imaging: Fluoroscopic guidance Anesthesia: Local anesthesia (1-2% Lidocaine) Anxiolysis: Oral Valium 5 mg Sedation:  minimal . DOS: 05/14/2022  Performed by: Gillis Santa, MD  Purpose: Diagnostic/Therapeutic Indications: Lumbar radicular pain of intraspinal etiology of more than 4 weeks that has failed to respond to conservative therapy and is severe enough to impact quality of life or function. 1. Lumbar radiculopathy (L>R)   2. Chronic radicular lumbar pain   3. Neuroforaminal stenosis of lumbar spine (L3,4,5)   4. Chronic pain syndrome   5. Cervicalgia     NAS-11 Pain score:   Pre-procedure: 7 /10   Post-procedure: 0-No pain/10      Position / Prep / Materials:  Position: Prone w/ head of the table raised (slight reverse trendelenburg) to facilitate breathing.  Prep solution: DuraPrep  (Iodine Povacrylex [0.7% available iodine] and Isopropyl Alcohol, 74% w/w) Prep Area: Entire Posterior Lumbar Region from lower scapular tip down to mid buttocks area and from flank to flank. Materials:  Tray: Epidural tray Needle(s):  Type: Epidural needle          Gauge (G):  17 Length: Regular (3.5-in) Qty: 1  Pre-op H&P Assessment:  Thomas Cole is a 52 y.o. (year old), male patient, seen today for interventional treatment. He  has a past surgical history that includes Knee surgery; Posterior fusion cervical spine; Cervical disc surgery; Appendectomy (12); Anterior cervical corpectomy (N/A, 09/15/2012); Cholecystectomy (N/A, 03/29/2017); Colonoscopy with propofol (N/A, 10/23/2019); and Esophagogastroduodenoscopy (egd) with propofol (N/A, 10/23/2019). Thomas Cole has a current medication list which includes the following prescription(s): carbidopa-levodopa, carbidopa-levodopa, vitamin d3, clonazepam, duloxetine, hydrochlorothiazide, hydrocodone bitartrate er, [START ON 05/26/2022] hydrocodone bitartrate er, [START ON 06/25/2022] hydrocodone bitartrate er, hydrocodone-acetaminophen, [START ON 05/26/2022] hydrocodone-acetaminophen, [START ON 06/25/2022] hydrocodone-acetaminophen, losartan, metformin, metoprolol succinate, nortriptyline, omeprazole, phenytoin, pregabalin, sildenafil, tizanidine, ubrelvy, and cyanocobalamin. His primarily concern today is the Back Pain (lower) and Neck Pain  Initial Vital Signs:  Pulse/HCG Rate: 97ECG Heart Rate: 92 Temp: (!) 97.5 F (36.4 C) Resp: 17 BP: 126/88 SpO2: 98 %  BMI: Estimated body mass index is 38.75 kg/m as calculated from the following:   Height as of this encounter: 6' 3"$  (1.905 m).   Weight as of this encounter: 310 lb (140.6 kg).  Risk Assessment: Allergies: Reviewed. He is allergic to amitriptyline, bee venom, chlorhexidine, carbamazepine, meloxicam, morphine and related, and sulfa antibiotics.  Allergy Precautions: None required Coagulopathies:  Reviewed. None identified.  Blood-thinner therapy: None at this time Active Infection(s): Reviewed. None identified. Thomas Cole is afebrile  Site Confirmation: Thomas Cole was asked to confirm the procedure and laterality  before marking the site Procedure checklist: Completed Consent: Before the procedure and under the influence of no sedative(s), amnesic(s), or anxiolytics, the patient was informed of the treatment options, risks and possible complications. To fulfill our ethical and legal obligations, as recommended by the American Medical Association's Code of Ethics, I have informed the patient of my clinical impression; the nature and purpose of the treatment or procedure; the risks, benefits, and possible complications of the intervention; the alternatives, including doing nothing; the risk(s) and benefit(s) of the alternative treatment(s) or procedure(s); and the risk(s) and benefit(s) of doing nothing. The patient was provided information about the general risks and possible complications associated with the procedure. These may include, but are not limited to: failure to achieve desired goals, infection, bleeding, organ or nerve damage, allergic reactions, paralysis, and death. In addition, the patient was informed of those risks and complications associated to Spine-related procedures, such as failure to decrease pain; infection (i.e.: Meningitis, epidural or intraspinal abscess); bleeding (i.e.: epidural hematoma, subarachnoid hemorrhage, or any other type of intraspinal or peri-dural bleeding); organ or nerve damage (i.e.: Any type of peripheral nerve, nerve root, or spinal cord injury) with subsequent damage to sensory, motor, and/or autonomic systems, resulting in permanent pain, numbness, and/or weakness of one or several areas of the body; allergic reactions; (i.e.: anaphylactic reaction); and/or death. Furthermore, the patient was informed of those risks and complications associated with the  medications. These include, but are not limited to: allergic reactions (i.e.: anaphylactic or anaphylactoid reaction(s)); adrenal axis suppression; blood sugar elevation that in diabetics may result in ketoacidosis or comma; water retention that in patients with history of congestive heart failure may result in shortness of breath, pulmonary edema, and decompensation with resultant heart failure; weight gain; swelling or edema; medication-induced neural toxicity; particulate matter embolism and blood vessel occlusion with resultant organ, and/or nervous system infarction; and/or aseptic necrosis of one or more joints. Finally, the patient was informed that Medicine is not an exact science; therefore, there is also the possibility of unforeseen or unpredictable risks and/or possible complications that may result in a catastrophic outcome. The patient indicated having understood very clearly. We have given the patient no guarantees and we have made no promises. Enough time was given to the patient to ask questions, all of which were answered to the patient's satisfaction. Mr. Quattrone has indicated that he wanted to continue with the procedure. Attestation: I, the ordering provider, attest that I have discussed with the patient the benefits, risks, side-effects, alternatives, likelihood of achieving goals, and potential problems during recovery for the procedure that I have provided informed consent. Date  Time: 05/14/2022 11:11 AM  Pre-Procedure Preparation:  Monitoring: As per clinic protocol. Respiration, ETCO2, SpO2, BP, heart rate and rhythm monitor placed and checked for adequate function Safety Precautions: Patient was assessed for positional comfort and pressure points before starting the procedure. Time-out: I initiated and conducted the "Time-out" before starting the procedure, as per protocol. The patient was asked to participate by confirming the accuracy of the "Time Out" information. Verification  of the correct person, site, and procedure were performed and confirmed by me, the nursing staff, and the patient. "Time-out" conducted as per Joint Commission's Universal Protocol (UP.01.01.01). Time: 1143  Description/Narrative of Procedure:          Target: Epidural space via interlaminar opening, initially targeting the lower laminar border of the superior vertebral body. Region: Lumbar Approach: Percutaneous paravertebral  Rationale (medical necessity): procedure needed and proper for the  diagnosis and/or treatment of the patient's medical symptoms and needs. Procedural Technique Safety Precautions: Aspiration looking for blood return was conducted prior to all injections. At no point did we inject any substances, as a needle was being advanced. No attempts were made at seeking any paresthesias. Safe injection practices and needle disposal techniques used. Medications properly checked for expiration dates. SDV (single dose vial) medications used. Description of the Procedure: Protocol guidelines were followed. The procedure needle was introduced through the skin, ipsilateral to the reported pain, and advanced to the target area. Bone was contacted and the needle walked caudad, until the lamina was cleared. The epidural space was identified using "loss-of-resistance technique" with 2-3 ml of PF-NaCl (0.9% NSS), in a 5cc LOR glass syringe.  Vitals:   05/14/22 1139 05/14/22 1144 05/14/22 1148 05/14/22 1153  BP: 133/89 124/81 (!) 135/99 (!) 139/92  Pulse:      Resp: 15 13 13 13  $ Temp:      TempSrc:      SpO2: 95% 94% 94% 100%  Weight:      Height:          6.5 cc solution made of 3.5cc of preservative-free saline, 2 cc of 0.2% ropivacaine, 1 cc of Decadron 10 mg/cc.  Afterwards bilateral trigger point injections were for cervical paraspinal muscles and b/l trapezius.  Needling was performed and 0.5 to 1 cc of 0.2% ropivacaine was injected into each trigger point.   Start Time: 1143  hrs. End Time: 1151 hrs.  Imaging Guidance (Spinal):          Type of Imaging Technique: Fluoroscopy Guidance (Spinal) Indication(s): Assistance in needle guidance and placement for procedures requiring needle placement in or near specific anatomical locations not easily accessible without such assistance. Exposure Time: Please see nurses notes. Contrast: Before injecting any contrast, we confirmed that the patient did not have an allergy to iodine, shellfish, or radiological contrast. Once satisfactory needle placement was completed at the desired level, radiological contrast was injected. Contrast injected under live fluoroscopy. No contrast complications. See chart for type and volume of contrast used. Fluoroscopic Guidance: I was personally present during the use of fluoroscopy. "Tunnel Vision Technique" used to obtain the best possible view of the target area. Parallax error corrected before commencing the procedure. "Direction-depth-direction" technique used to introduce the needle under continuous pulsed fluoroscopy. Once target was reached, antero-posterior, oblique, and lateral fluoroscopic projection used confirm needle placement in all planes. Images permanently stored in EMR. Interpretation: I personally interpreted the imaging intraoperatively. Adequate needle placement confirmed in multiple planes. Appropriate spread of contrast into desired area was observed. No evidence of afferent or efferent intravascular uptake. No intrathecal or subarachnoid spread observed. Permanent images saved into the patient's record.   Post-operative Assessment:  Post-procedure Vital Signs:  Pulse/HCG Rate: 9796 Temp: (!) 97.5 F (36.4 C) Resp: 13 BP: (!) 139/92 SpO2: 100 %  EBL: None  Complications: No immediate post-treatment complications observed by team, or reported by patient.  Note: The patient tolerated the entire procedure well. A repeat set of vitals were taken after the procedure and the  patient was kept under observation following institutional policy, for this type of procedure. Post-procedural neurological assessment was performed, showing return to baseline, prior to discharge. The patient was provided with post-procedure discharge instructions, including a section on how to identify potential problems. Should any problems arise concerning this procedure, the patient was given instructions to immediately contact us, at any time, without hesitation. In any case, we  plan to contact the patient by telephone for a follow-up status report regarding this interventional procedure.  Comments:  No additional relevant information.  Plan of Care  Orders:  Orders Placed This Encounter  Procedures   DG PAIN [facility] C-ARM 1-60 MIN NO REPORT    Intraoperative interpretation by procedural physician at [place].    Standing Status:   Standing    Number of Occurrences:   1    Order Specific Question:   Reason for exam:    Answer:   Assistance in needle guidance and placement for procedures requiring needle placement in or near specific anatomical locations not easily accessible without such assistance.   Chronic Opioid Analgesic:  Hydrocodone extended release 10 mg twice daily, hydrocodone immediate release 10 mg 3 times daily as needed for breakthrough pain    Medications ordered for procedure: Meds ordered this encounter  Medications   iohexol (OMNIPAQUE) 180 MG/ML injection 10 mL    Must be Myelogram-compatible. If not available, you may substitute with a water-soluble, non-ionic, hypoallergenic, myelogram-compatible radiological contrast medium.   lidocaine (XYLOCAINE) 2 % (with pres) injection 400 mg   diazepam (VALIUM) tablet 5 mg    Make sure Flumazenil is available in the pyxis when using this medication. If oversedation occurs, administer 0.2 mg IV over 15 sec. If after 45 sec no response, administer 0.2 mg again over 1 min; may repeat at 1 min intervals; not to  exceed 4 doses (1 mg)   ropivacaine (PF) 2 mg/mL (0.2%) (NAROPIN) injection 2 mL   sodium chloride flush (NS) 0.9 % injection 2 mL   dexamethasone (DECADRON) injection 10 mg   Medications administered: We administered iohexol, lidocaine, diazepam, ropivacaine (PF) 2 mg/mL (0.2%), sodium chloride flush, and dexamethasone.  See the medical record for exact dosing, route, and time of administration.  Follow-up plan:   Return for Keep sch. appt.      Recent Visits Date Type Provider Dept  04/24/22 Office Visit Gillis Santa, MD [facility]  Showing recent visits within past 90 days and meeting all other requirements Today's Visits Date Type Provider Dept  05/14/22 Procedure visit Gillis Santa, MD [facility]  Showing today's visits and meeting all other requirements Future Appointments Date Type Provider Dept  07/19/22 Appointment Gillis Santa, MD [facility]  Showing future appointments within next 90 days and meeting all other requirements  Disposition: Discharge home  Discharge (Date  Time): 05/14/2022; 1159 hrs.   Primary Care Physician: Derinda Late, MD Location: [facility] Note by: Gillis Santa, MD Date: 05/14/2022; Time: 12:04 PM  Disclaimer:  Medicine is not an exact science. The only guarantee in medicine is that nothing is guaranteed. It is important to note that the decision to proceed with this intervention was based on the information collected from the patient. The Data and conclusions were drawn from the patient's questionnaire, the interview, and the physical examination. Because the information was provided in large part by the patient, it cannot be guaranteed that it has not been purposely or unconsciously manipulated. Every effort has been made to obtain as much relevant data as possible for this evaluation. It is important to note that the conclusions that lead to this procedure are derived in large  part from the available data. Always take into account that the treatment will also be dependent on availability of resources and existing treatment guidelines, considered by other Pain Management Practitioners as being common knowledge and practice, at  the time of the intervention. For Medico-Legal purposes, it is also important to point out that variation in procedural techniques and pharmacological choices are the acceptable norm. The indications, contraindications, technique, and results of the above procedure should only be interpreted and judged by a Board-Certified Interventional Pain Specialist with extensive familiarity and expertise in the same exact procedure and technique.

## 2022-05-14 NOTE — Progress Notes (Signed)
Safety precautions to be maintained throughout the outpatient stay will include: orient to surroundings, keep bed in low position, maintain call bell within reach at all times, provide assistance with transfer out of bed and ambulation.  

## 2022-05-15 ENCOUNTER — Telehealth: Payer: Self-pay

## 2022-05-15 NOTE — Telephone Encounter (Signed)
Post procedure follow up.  LM 

## 2022-07-02 ENCOUNTER — Telehealth: Payer: Self-pay

## 2022-07-02 NOTE — Telephone Encounter (Signed)
[employer] called needing clarification on dosage for Ubrelvy. Please call back [phone]  option 5.

## 2022-07-02 NOTE — Telephone Encounter (Signed)
His insurance company called and said his Thomas Cole has been approved for 1 year starting today. He did not have a reference number or authorization number to give me. He said we would get a fax.

## 2022-07-19 ENCOUNTER — Ambulatory Visit
Payer: Medicare Other | Attending: Student in an Organized Health Care Education/Training Program | Admitting: Student in an Organized Health Care Education/Training Program

## 2022-07-19 ENCOUNTER — Encounter: Payer: Self-pay | Admitting: Student in an Organized Health Care Education/Training Program

## 2022-07-19 VITALS — BP 136/84 | HR 101 | Temp 98.2°F | Resp 18 | Ht 74.0 in | Wt 313.0 lb

## 2022-07-19 DIAGNOSIS — G894 Chronic pain syndrome: Secondary | ICD-10-CM | POA: Insufficient documentation

## 2022-07-19 DIAGNOSIS — G959 Disease of spinal cord, unspecified: Secondary | ICD-10-CM | POA: Diagnosis present

## 2022-07-19 DIAGNOSIS — M5414 Radiculopathy, thoracic region: Secondary | ICD-10-CM | POA: Diagnosis present

## 2022-07-19 DIAGNOSIS — M960 Pseudarthrosis after fusion or arthrodesis: Secondary | ICD-10-CM | POA: Diagnosis present

## 2022-07-19 DIAGNOSIS — M5416 Radiculopathy, lumbar region: Secondary | ICD-10-CM | POA: Insufficient documentation

## 2022-07-19 DIAGNOSIS — Z9889 Other specified postprocedural states: Secondary | ICD-10-CM | POA: Diagnosis present

## 2022-07-19 DIAGNOSIS — G8929 Other chronic pain: Secondary | ICD-10-CM | POA: Diagnosis present

## 2022-07-19 DIAGNOSIS — M542 Cervicalgia: Secondary | ICD-10-CM

## 2022-07-19 DIAGNOSIS — M5412 Radiculopathy, cervical region: Secondary | ICD-10-CM | POA: Diagnosis not present

## 2022-07-19 DIAGNOSIS — M48061 Spinal stenosis, lumbar region without neurogenic claudication: Secondary | ICD-10-CM

## 2022-07-19 MED ORDER — HYDROCODONE-ACETAMINOPHEN 10-325 MG PO TABS
1.0000 | ORAL_TABLET | Freq: Three times a day (TID) | ORAL | 0 refills | Status: DC | PRN
Start: 2022-08-22 — End: 2023-04-16

## 2022-07-19 MED ORDER — HYDROCODONE BITARTRATE ER 10 MG PO CP12
10.0000 mg | ORAL_CAPSULE | Freq: Two times a day (BID) | ORAL | 0 refills | Status: AC
Start: 2022-08-22 — End: 2022-09-21

## 2022-07-19 MED ORDER — HYDROCODONE BITARTRATE ER 10 MG PO CP12
10.0000 mg | ORAL_CAPSULE | Freq: Two times a day (BID) | ORAL | 0 refills | Status: AC
Start: 2022-07-23 — End: 2022-08-22

## 2022-07-19 MED ORDER — HYDROCODONE-ACETAMINOPHEN 10-325 MG PO TABS
1.0000 | ORAL_TABLET | Freq: Three times a day (TID) | ORAL | 0 refills | Status: DC | PRN
Start: 2022-07-23 — End: 2022-10-18

## 2022-07-19 MED ORDER — HYDROCODONE-ACETAMINOPHEN 10-325 MG PO TABS
1.0000 | ORAL_TABLET | Freq: Three times a day (TID) | ORAL | 0 refills | Status: DC | PRN
Start: 2022-09-21 — End: 2022-10-18

## 2022-07-19 MED ORDER — HYDROCODONE BITARTRATE ER 10 MG PO CP12
10.0000 mg | ORAL_CAPSULE | Freq: Two times a day (BID) | ORAL | 0 refills | Status: DC
Start: 2022-09-21 — End: 2022-10-18

## 2022-07-19 NOTE — Progress Notes (Signed)
PROVIDER NOTE: Information contained herein reflects review and annotations entered in association with encounter. Interpretation of such information and data should be left to medically-trained personnel. Information provided to patient can be located elsewhere in the medical record under "Patient Instructions". Document created using STT-dictation technology, any transcriptional errors that may result from process are unintentional.    Patient: Thomas Cole  Service Category: E/M  Provider: Edward Jolly, MD  DOB: October 05, 1970  DOS: 07/19/2022  Specialty: Interventional Pain Management  MRN: [id number]  Setting: Ambulatory outpatient  PCP: Kandyce Rud, MD  Type: Established Patient    Referring Provider: Kandyce Rud, MD  Location: Office  Delivery: Face-to-face     HPI  [name], a 52 y.o. year old male, is here today because of his Cervical radicular pain [M54.12]. [name] primary complain today is Neck Pain  Last encounter: My last encounter with him was on 05/14/21  Pertinent problems: [name] has Spondylosis, cervical, with myelopathy; Migraines; History of seizures; Generalized anxiety disorder; and Bilateral occipital neuralgia on their pertinent problem list. Pain Assessment: Severity of Chronic pain is reported as a 7 /10. Location: Neck Posterior, Right, Left/Radaites from neck to shoulders bilateral into lower back. Onset: More than a month ago. Quality: Constant, Aching, Burning, Tingling. Timing: Constant. Modifying factor(s): Procedures. Vitals:  height is 6\' 2"  (1.88 m) and weight is 313 lb (142 kg) (abnormal). His temporal temperature is 98.2 F (36.8 C). His blood pressure is 136/84 and his pulse is 101 (abnormal). His respiration is 18 and oxygen saturation is 100%.   Reason for encounter: medication management.  Thomas Cole presents today for medication management.    Having increased cervical spine pain with radiation to his left shoulder and left fingers,  describes burning and tingling of his left fingers. Weakness of left hands and fingers. Dicussed C-ESI.  History of cervical fusion.  Also experiencing increased left low back pain with radiation into his left hip and left thigh in a dermatomal fashion. Discussed waiting until June before repeat L-ESI.  Otherwise we will refill his chronic pain medications as below.   Pharmacotherapy Assessment  Analgesic: Hydrocodone extended release 10 mg twice daily, hydrocodone immediate release 10 mg 3 times daily as needed for breakthrough pain    Monitoring:Hydrocodone extended release 10 mg twice daily, hydrocodone immediate release 10 mg 3 times daily as needed for breakthrough pain   [place] PMP: PDMP reviewed during this encounter.       Pharmacotherapy: No side-effects or adverse reactions reported. Compliance: No problems identified. Effectiveness: Clinically acceptable.  UDS:  Summary  Date Value Ref Range Status  10/17/2021 Note  Final    Comment:    ==================================================================== Compliance Drug Analysis, Ur ==================================================================== Test                             Result       Flag       Units  Drug Present and Declared for Prescription Verification   7-aminoclonazepam              96           EXPECTED   ng/mg creat    7-aminoclonazepam is an expected metabolite of clonazepam. Source of    clonazepam is a scheduled prescription medication.    Hydrocodone                    758  EXPECTED   ng/mg creat   Hydromorphone                  87           EXPECTED   ng/mg creat   Dihydrocodeine                 263          EXPECTED   ng/mg creat   Norhydrocodone                 1508         EXPECTED   ng/mg creat    Sources of hydrocodone include scheduled prescription medications.    Hydromorphone, dihydrocodeine and norhydrocodone are expected    metabolites of hydrocodone. Hydromorphone and  dihydrocodeine are    also available as scheduled prescription medications.    Pregabalin                     PRESENT      EXPECTED   Tizanidine                     PRESENT      EXPECTED   Duloxetine                     PRESENT      EXPECTED   Nortriptyline                  PRESENT      EXPECTED    Nortriptyline may be administered as a prescription drug; it is also    an expected metabolite of amitriptyline.    Acetaminophen                  PRESENT      EXPECTED  Drug Absent but Declared for Prescription Verification   Phenytoin                      Not Detected UNEXPECTED   Metoprolol                     Not Detected UNEXPECTED ==================================================================== Test                      Result    Flag   Units      Ref Range   Creatinine              76               mg/dL      >=43 ==================================================================== Declared Medications:  The flagging and interpretation on this report are based on the  following declared medications.  Unexpected results may arise from  inaccuracies in the declared medications.   **Note: The testing scope of this panel includes these medications:   Clonazepam (Klonopin)  Duloxetine (Cymbalta)  Hydrocodone  Hydrocodone (Norco)  Metoprolol (Toprol)  Nortriptyline (Pamelor)  Phenytoin (Dilantin)  Pregabalin (Lyrica)   **Note: The testing scope of this panel does not include small to  moderate amounts of these reported medications:   Acetaminophen (Norco)  Tizanidine (Zanaflex)   **Note: The testing scope of this panel does not include the  following reported medications:   Carbidopa (Sinemet)  Cyanocobalamin  Furosemide (Lasix)  Glipizide  Hydrochlorothiazide (Hydrodiuril)  Levodopa (Sinemet)  Losartan (Cozaar)  Metformin (Glucophage)  Omeprazole (Prilosec)  Ubrogepant Bernita Raisin)  Vitamin  D3 ==================================================================== For clinical  consultation, please call [phone]. ====================================================================       ROS  Constitutional: Denies any fever or chills Gastrointestinal: No reported hemesis, hematochezia, vomiting, or acute GI distress Musculoskeletal: c-spine pain, with radiation to his left arm and fingers, low back pain with radiation into left leg Neurological: No reported episodes of acute onset apraxia, aphasia, dysarthria, agnosia, amnesia, paralysis, loss of coordination, or loss of consciousness  Medication Review  DULoxetine, HYDROcodone Bitartrate ER, HYDROcodone-acetaminophen, Ubrogepant, Vitamin D3, carbidopa-levodopa, clonazePAM, cyanocobalamin, hydrochlorothiazide, losartan, metFORMIN, metoprolol succinate, nortriptyline, omeprazole, phenytoin, pregabalin, sildenafil, and tiZANidine  History Review  Allergy: [name] is allergic to amitriptyline, bee venom, chlorhexidine, carbamazepine, meloxicam, morphine and related, and sulfa antibiotics. Drug: [name]  reports no history of drug use. Alcohol:  reports no history of alcohol use. Tobacco:  reports that he has never smoked. He has never used smokeless tobacco. Social: [name]  reports that he has never smoked. He has never used smokeless tobacco. He reports that he does not drink alcohol and does not use drugs. Medical:  has a past medical history of Anxiety, Asthma, Back problem, Benign essential hypertension (11/30/2016), Cervical spondylosis without myelopathy (09/17/2012), Complication of anesthesia, Depression, Diabetes mellitus without complication (02/2020), GERD (gastroesophageal reflux disease), Hypertension, Labile hypertension (03/19/2013), Major depressive disorder, recurrent episode, severe (06/23/2015), Migraine headache, Migraines (11/11/2013), S/P appendectomy (04/09/2011), Seizure (11/30/2016), Seizures, Shortness  of breath, and SOB (shortness of breath) (08/06/2014). Surgical: Mr. Stetzer  has a past surgical history that includes Knee surgery; Posterior fusion cervical spine; Cervical disc surgery; Appendectomy (12); Anterior cervical corpectomy (N/A, 09/15/2012); Cholecystectomy (N/A, 03/29/2017); Colonoscopy with propofol (N/A, 10/23/2019); and Esophagogastroduodenoscopy (egd) with propofol (N/A, 10/23/2019). Family: family history includes Arthritis in his father, maternal grandfather, maternal grandmother, mother, paternal grandfather, and paternal grandmother; Asthma in his mother; Coronary artery disease in his father and mother; Diabetes in his father, paternal grandfather, and paternal grandmother; Heart disease in his father, maternal grandfather, maternal grandmother, paternal grandfather, and paternal grandmother; Hypertension in his maternal grandfather, maternal grandmother, mother, paternal grandfather, and paternal grandmother; Mental illness in his mother.  Laboratory Chemistry Profile   Renal Lab Results  Component Value Date   BUN 13 04/21/2017   CREATININE 0.93 04/21/2017   LABCREA 79.3 03/05/2014   GFR 82.19 08/05/2014   GFRAA >60 04/21/2017   GFRNONAA >60 04/21/2017     Hepatic Lab Results  Component Value Date   AST 30 04/21/2017   ALT 23 04/21/2017   ALBUMIN 4.2 04/21/2017   ALKPHOS 86 04/21/2017   LIPASE 51 04/21/2017     Electrolytes Lab Results  Component Value Date   NA 138 04/21/2017   K 3.7 04/21/2017   CL 101 04/21/2017   CALCIUM 9.4 04/21/2017   MG 1.9 04/26/2020     Bone Lab Results  Component Value Date   VD25OH 31.95 03/05/2016   25OHVITD1 35 04/26/2020   25OHVITD2 <1.0 04/26/2020   25OHVITD3 35 04/26/2020     Inflammation (CRP: Acute Phase) (ESR: Chronic Phase) Lab Results  Component Value Date   CRP 10 04/26/2020   ESRSEDRATE 22 04/26/2020       Note: Above Lab results reviewed.   Physical Exam  General appearance: Well nourished, well  developed, and well hydrated. In no apparent acute distress Mental status: Alert, oriented x 3 (person, place, & time)       Respiratory: No evidence of acute respiratory distress Eyes: PERLA Vitals: BP 136/84   Pulse (!) 101   Temp 98.2 F (36.8  C) (Temporal)   Resp 18   Ht  (1.88 m)   Wt (!) 313 lb (142 kg)   SpO2 100%   BMI 40.19 kg/m  BMI: Estimated body mass index is 40.19 kg/m as calculated from the following:   Height as of this encounter:  (1.88 m).   Weight as of this encounter: 313 lb (142 kg). Ideal: Ideal body weight: 82.2 kg (181 lb 3.5 oz) Adjusted ideal body weight: 106.1 kg (233 lb 14.9 oz)  Cervical Spine Area Exam  Skin & Axial Inspection: Well healed scar from previous spine surgery detected Alignment: Symmetrical Functional ROM: Pain restricted ROM, bilaterally Stability: No instability detected Muscle Tone/Strength: Functionally intact. No obvious neuro-muscular anomalies detected. Sensory (Neurological): Dermatomal pain pattern and neurogenic Palpation: No palpable anomalies               Upper Extremity (UE) Exam      Side: Right upper extremity   Side: Left upper extremity  Skin & Extremity Inspection: Skin color, temperature, and hair growth are WNL. No peripheral edema or cyanosis. No masses, redness, swelling, asymmetry, or associated skin lesions. No contractures.   Skin & Extremity Inspection: Skin color, temperature, and hair growth are WNL. No peripheral edema or cyanosis. No masses, redness, swelling, asymmetry, or associated skin lesions. No contractures.  Functional ROM: Pain restricted ROM for shoulder and elbow   Functional ROM: Pain restricted ROM for shoulder and elbow  Muscle Tone/Strength: Functionally intact. No obvious neuro-muscular anomalies detected.   Muscle Tone/Strength: Functionally intact. No obvious neuro-muscular anomalies detected.  Sensory (Neurological): Unimpaired           Sensory (Neurological): Unimpaired           Palpation: No palpable anomalies               Palpation: No palpable anomalies              Provocative Test(s):  Phalen's test: deferred Tinel's test: deferred Apley's scratch test (touch opposite shoulder):  Action 1 (Across chest): Decreased ROM Action 2 (Overhead): Decreased ROM Action 3 (LB reach): Decreased ROM       Provocative Test(s):  Phalen's test: deferred Tinel's test: deferred Apley's scratch test (touch opposite shoulder):  Action 1 (Across chest): Decreased ROM Action 2 (Overhead): Decreased ROM Action 3 (LB reach): Decreased ROM     Lumbar Spine Area Exam  Skin & Axial Inspection: No masses, redness, or swelling Alignment: Symmetrical Functional ROM: Pain restricted ROM Stability: No instability detected Muscle Tone/Strength: Functionally intact. No obvious neuro-muscular anomalies detected. Sensory (Neurological): Dermatomal pain pattern left greater than right   Gait & Posture Assessment  Ambulation: Patient came in today in a wheel chair Gait: Antalgic gait (limping) Posture: Difficulty standing up straight, due to pain   Lower Extremity Exam    Side: Right lower extremity  Side: Left lower extremity  Stability: No instability observed          Stability: No instability observed          Skin & Extremity Inspection: Skin color, temperature, and hair growth are WNL. No peripheral edema or cyanosis. No masses, redness, swelling, asymmetry, or associated skin lesions. No contractures.  Skin & Extremity Inspection: Skin color, temperature, and hair growth are WNL. No peripheral edema or cyanosis. No masses, redness, swelling, asymmetry, or associated skin lesions. No contractures.  Functional ROM: Unrestricted ROM  Functional ROM: Pain restricted ROM for hip and knee joints, i Adequate SLR (straight leg raise)  Muscle Tone/Strength: Functionally intact. No obvious neuro-muscular anomalies detected.  Muscle Tone/Strength: Functionally intact.  No obvious neuro-muscular anomalies detected.  Sensory (Neurological): Unimpaired        Sensory (Neurological): Dermatomal pain pattern        DTR: Patellar: deferred today Achilles: deferred today Plantar: deferred today  DTR: Patellar: deferred today Achilles: deferred today Plantar: deferred today  Palpation: No palpable anomalies  Palpation: No palpable anomalies     Assessment   Status Diagnosis  Having a Flare-up Having a Flare-up Having a Flare-up 1. Cervical radicular pain   2. Lumbar radiculopathy (L>R)   3. Chronic radicular lumbar pain   4. Neuroforaminal stenosis of lumbar spine (L3,4,5)   5. Cervicalgia   6. History of lumbar surgery (1994 L4/5 discetomy)   7. Thoracic radiculopathy   8. Myelopathy of cervical spinal cord with cervical radiculopathy   9. Chronic pain syndrome   10. Failed cervical fusion         Plan of Care  [name] has a current medication list which includes the following long-term medication(s): carbidopa-levodopa, carbidopa-levodopa, clonazepam, metformin, metoprolol succinate, nortriptyline, pregabalin, sildenafil, duloxetine, [START ON 07/23/2022] hydrocodone-acetaminophen, [START ON 08/22/2022] hydrocodone-acetaminophen, and [START ON 09/21/2022] hydrocodone-acetaminophen.  Pharmacotherapy (Medications Ordered): Meds ordered this encounter  Medications   HYDROcodone-acetaminophen (NORCO) 10-325 MG tablet    Sig: Take 1 tablet by mouth every 8 (eight) hours as needed. For chronic pain syndrome. Each Rx to last 30 days.    Dispense:  90 tablet    Refill:  0   HYDROcodone Bitartrate ER 10 MG CP12    Sig: Take 10 mg by mouth every 12 (twelve) hours. Must last 30 days.    Dispense:  60 capsule    Refill:  0    Chronic Pain: STOP Act (Not applicable) Fill 1 day early if closed on refill date. Avoid benzodiazepines within 8 hours of opioids   HYDROcodone-acetaminophen (NORCO) 10-325 MG tablet    Sig: Take 1 tablet by mouth every  8 (eight) hours as needed. For chronic pain syndrome. Each Rx to last 30 days.    Dispense:  90 tablet    Refill:  0   HYDROcodone-acetaminophen (NORCO) 10-325 MG tablet    Sig: Take 1 tablet by mouth every 8 (eight) hours as needed. For chronic pain syndrome. Each Rx to last 30 days.    Dispense:  90 tablet    Refill:  0   HYDROcodone Bitartrate ER 10 MG CP12    Sig: Take 10 mg by mouth every 12 (twelve) hours. Must last 30 days.    Dispense:  60 capsule    Refill:  0    Chronic Pain: STOP Act (Not applicable) Fill 1 day early if closed on refill date. Avoid benzodiazepines within 8 hours of opioids   HYDROcodone Bitartrate ER 10 MG CP12    Sig: Take 10 mg by mouth every 12 (twelve) hours. Must last 30 days.    Dispense:  60 capsule    Refill:  0    Chronic Pain: STOP Act (Not applicable) Fill 1 day early if closed on refill date. Avoid benzodiazepines within 8 hours of opioids  Continue magnesium 500 to 1000 mg nightly for muscle cramps.  Instructed to not take this with tizanidine.   Orders Placed This Encounter  Procedures   Cervical Epidural Injection    Sedation: Patient's  choice. Purpose: Diagnostic/Therapeutic Indication(s): Radiculitis and cervicalgia associater with cervical degenerative disc disease.    Standing Status:   Future    Standing Expiration Date:   10/18/2022    Scheduling Instructions:     Procedure: Cervical Epidural Steroid Injection/Block; PO Valium 10 mg     Level(s): C7-T1     Laterality: LEFT     Timeframe: As soon as schedule allows    Order Specific Question:   Where will this procedure be performed?    Answer:   [facility]    Comments:   Jensine Luz   Lumbar Epidural Injection    Standing Status:   Future    Standing Expiration Date:   10/18/2022    Scheduling Instructions:     Procedure: Interlaminar Lumbar Epidural Steroid injection (LESI)            Laterality: Midline     Sedation: PO Valium     Timeframe: ASAA    Order Specific  Question:   Where will this procedure be performed?    Answer:   [facility]     Follow-up plan:   Return in about 4 weeks (around 08/16/2022) for Left C-ESI , in clinic (PO Valium).    Recent Visits Date Type Provider Dept  05/14/22 Procedure visit Edward Jolly, MD [facility]  04/24/22 Office Visit Edward Jolly, MD [facility]  Showing recent visits within past 90 days and meeting all other requirements Today's Visits Date Type Provider Dept  07/19/22 Office Visit Edward Jolly, MD [facility]  Showing today's visits and meeting all other requirements Future Appointments No visits were found meeting these conditions. Showing future appointments within next 90 days and meeting all other requirements  I discussed the assessment and treatment plan with the patient. The patient was provided an opportunity to ask questions and all were answered. The patient agreed with the plan and demonstrated an understanding of the instructions.  Patient advised to call back or seek an in-person evaluation if the symptoms or condition worsens.  Duration of encounter: 30 minutes.  Note by: Edward Jolly, MD Date: 07/19/2022; Time: 2:40 PM

## 2022-07-19 NOTE — Patient Instructions (Signed)

## 2022-07-19 NOTE — Progress Notes (Signed)
Safety precautions to be maintained throughout the outpatient stay will include: orient to surroundings, keep bed in low position, maintain call bell within reach at all times, provide assistance with transfer out of bed and ambulation.   Nursing Pain Medication Assessment:  Safety precautions to be maintained throughout the outpatient stay will include: orient to surroundings, keep bed in low position, maintain call bell within reach at all times, provide assistance with transfer out of bed and ambulation.  Medication Inspection Compliance: Pill count conducted under aseptic conditions, in front of the patient. Neither the pills nor the bottle was removed from the patient's sight at any time. Once count was completed pills were immediately returned to the patient in their original bottle.  Medication: Hydrocodone/APAP Pill/Patch Count:  30 of 90 pills remain Pill/Patch Appearance: Markings consistent with prescribed medication Bottle Appearance: Standard pharmacy container. Clearly labeled. Filled Date: 03 / 25 / 2024 Last Medication intake:  Today    Medication:  Hydrocodone ER Pill/Patch Count:  24 of 60 pills remain Pill/Patch Appearance: Markings consistent with prescribed medication Bottle Appearance: Standard pharmacy container. Clearly labeled. Filled Date: 03 / 25 / 2024 Last Medication intake:  Today

## 2022-08-14 ENCOUNTER — Other Ambulatory Visit: Payer: Self-pay | Admitting: Student in an Organized Health Care Education/Training Program

## 2022-08-14 DIAGNOSIS — M5414 Radiculopathy, thoracic region: Secondary | ICD-10-CM

## 2022-08-14 DIAGNOSIS — G894 Chronic pain syndrome: Secondary | ICD-10-CM

## 2022-08-14 DIAGNOSIS — M5412 Radiculopathy, cervical region: Secondary | ICD-10-CM

## 2022-08-20 ENCOUNTER — Ambulatory Visit
Payer: Medicare Other | Attending: Student in an Organized Health Care Education/Training Program | Admitting: Student in an Organized Health Care Education/Training Program

## 2022-08-20 ENCOUNTER — Ambulatory Visit
Admission: RE | Admit: 2022-08-20 | Discharge: 2022-08-20 | Disposition: A | Discharge status: Home / Self Care | Payer: Medicare Other | Source: Ambulatory Visit | Attending: Student in an Organized Health Care Education/Training Program | Admitting: Student in an Organized Health Care Education/Training Program

## 2022-08-20 ENCOUNTER — Encounter: Payer: Self-pay | Admitting: Student in an Organized Health Care Education/Training Program

## 2022-08-20 VITALS — BP 123/72 | HR 90 | Temp 97.2°F | Resp 12 | Ht 75.0 in | Wt 310.0 lb

## 2022-08-20 DIAGNOSIS — M5412 Radiculopathy, cervical region: Secondary | ICD-10-CM | POA: Diagnosis present

## 2022-08-20 DIAGNOSIS — M542 Cervicalgia: Secondary | ICD-10-CM

## 2022-08-20 MED ORDER — ROPIVACAINE HCL 2 MG/ML IJ SOLN
INTRAMUSCULAR | Status: AC
  Filled 2022-08-20: qty 20

## 2022-08-20 MED ORDER — LIDOCAINE HCL (PF) 2 % IJ SOLN
INTRAMUSCULAR | Status: AC
  Filled 2022-08-20: qty 10

## 2022-08-20 MED ORDER — SODIUM CHLORIDE (PF) 0.9 % IJ SOLN
INTRAMUSCULAR | Status: AC
  Filled 2022-08-20: qty 10

## 2022-08-20 MED ORDER — DEXAMETHASONE SODIUM PHOSPHATE 10 MG/ML IJ SOLN
10.0000 mg | Freq: Once | INTRAMUSCULAR | Status: AC
  Administered 2022-08-20: 10 mg

## 2022-08-20 MED ORDER — DIAZEPAM 5 MG PO TABS
ORAL_TABLET | ORAL | Status: AC
  Filled 2022-08-20: qty 2

## 2022-08-20 MED ORDER — DEXAMETHASONE SODIUM PHOSPHATE 10 MG/ML IJ SOLN
INTRAMUSCULAR | Status: AC
  Filled 2022-08-20: qty 1

## 2022-08-20 MED ORDER — LIDOCAINE HCL 2 % IJ SOLN
20.0000 mL | Freq: Once | INTRAMUSCULAR | Status: AC
  Administered 2022-08-20: 200 mg

## 2022-08-20 MED ORDER — DIAZEPAM 5 MG PO TABS
10.0000 mg | ORAL_TABLET | ORAL | Status: AC
  Administered 2022-08-20: 10 mg via ORAL

## 2022-08-20 MED ORDER — SODIUM CHLORIDE 0.9% FLUSH
1.0000 mL | Freq: Once | INTRAVENOUS | Status: AC
  Administered 2022-08-20: 1 mL

## 2022-08-20 MED ORDER — IOHEXOL 180 MG/ML  SOLN
INTRAMUSCULAR | Status: AC
  Filled 2022-08-20: qty 20

## 2022-08-20 MED ORDER — IOHEXOL 180 MG/ML  SOLN
10.0000 mL | Freq: Once | INTRAMUSCULAR | Status: AC
  Administered 2022-08-20: 10 mL via EPIDURAL

## 2022-08-20 MED ORDER — ROPIVACAINE HCL 2 MG/ML IJ SOLN
1.0000 mL | Freq: Once | INTRAMUSCULAR | Status: AC
  Administered 2022-08-20: 1 mL via EPIDURAL

## 2022-08-20 NOTE — Progress Notes (Signed)
PROVIDER NOTE: Interpretation of information contained herein should be left to medically-trained personnel. Specific patient instructions are provided elsewhere under "Patient Instructions" section of medical record. This document was created in part using STT-dictation technology, any transcriptional errors that may result from this process are unintentional.  Patient: [name] Type: Established DOB: Apr 14, 1970 MRN: [id number] PCP: Kandyce Rud, MD  Service: Procedure DOS: 08/20/2022 Setting: Ambulatory Location: Ambulatory outpatient facility Delivery: Face-to-face Provider: Edward Jolly, MD Specialty: Interventional Pain Management Specialty designation: 09 Location: Outpatient facility Ref. Prov.: Kandyce Rud, MD       Interventional Therapy   Procedure: Cervical Epidural Steroid injection (CESI) (Interlaminar) #1  Laterality: Left  Level: T1-2 Imaging: Fluoroscopy-assisted DOS: 08/20/2022  Performed by: Edward Jolly, MD Anesthesia: Local anesthesia (1-2% Lidocaine) Anxiolysis:Valium 10 mg PO  Purpose: Diagnostic/Therapeutic Indications: Cervicalgia, cervical radicular pain, degenerative disc disease, severe enough to impact quality of life or function. 1. Cervical radicular pain   2. Cervicalgia    NAS-11 score:   Pre-procedure: 7 /10   Post-procedure: 7 /10      Position  Prep  Materials:  Location setting: Procedure suite Position: Prone, on modified reverse trendelenburg to facilitate breathing, with head in head-cradle. Pillows positioned under chest (below chin-level) with cervical spine flexed. Safety Precautions: Patient was assessed for positional comfort and pressure points before starting the procedure. Prepping solution: DuraPrep (Iodine Povacrylex [0.7% available iodine] and Isopropyl Alcohol, 74% w/w) Prep Area: Entire  cervicothoracic region Approach: percutaneous, paramedial Intended target: Posterior cervical epidural space Materials  Procedure:  Tray: Epidural Needle(s): Epidural (Tuohy) Qty: 1 Length: (90mm) 3.5-inch Gauge: 22G   Pre-op H&P Assessment:  Mr. Karwowski is a 52 y.o. (year old), male patient, seen today for interventional treatment. He  has a past surgical history that includes Knee surgery; Posterior fusion cervical spine; Cervical disc surgery; Appendectomy (12); Anterior cervical corpectomy (N/A, 09/15/2012); Cholecystectomy (N/A, 03/29/2017); Colonoscopy with propofol (N/A, 10/23/2019); and Esophagogastroduodenoscopy (egd) with propofol (N/A, 10/23/2019). Mr. Ferraioli has a current medication list which includes the following prescription(s): carbidopa-levodopa, carbidopa-levodopa, vitamin d3, clonazepam, duloxetine, furosemide, hydrochlorothiazide, hydrocodone bitartrate er, [START ON 08/22/2022] hydrocodone bitartrate er, [START ON 09/21/2022] hydrocodone bitartrate er, hydrocodone-acetaminophen, [START ON 08/22/2022] hydrocodone-acetaminophen, [START ON 09/21/2022] hydrocodone-acetaminophen, losartan, metformin, nortriptyline, omeprazole, phenytoin, pregabalin, sildenafil, tizanidine, ubrelvy, cyanocobalamin, and metoprolol succinate. His primarily concern today is the Neck Pain  Initial Vital Signs:  Pulse/HCG Rate: 90ECG Heart Rate: 80 Temp: (!) 97.2 F (36.2 C) Resp: 10 BP: 122/84 SpO2: 96 %  BMI: Estimated body mass index is 38.75 kg/m as calculated from the following:   Height as of this encounter: 6\' 3"  (1.905 m).   Weight as of this encounter: 310 lb (140.6 kg).  Risk Assessment: Allergies: Reviewed. He is allergic to amitriptyline, bee venom, chlorhexidine, carbamazepine, meloxicam, morphine and codeine, and sulfa antibiotics.  Allergy Precautions: None required Coagulopathies: Reviewed. None identified.  Blood-thinner therapy: None at this time Active Infection(s): Reviewed. None identified. Mr. Fullmer is afebrile  Site Confirmation: Mr. Rohs was asked to confirm the procedure and laterality  before marking the site Procedure checklist: Completed Consent: Before the procedure and under the influence of no sedative(s), amnesic(s), or anxiolytics, the patient was informed of the treatment options, risks and possible complications. To fulfill our ethical and legal obligations, as recommended by the American Medical Association's Code of Ethics, I have informed the patient of my clinical impression; the nature and purpose of the treatment or procedure; the risks, benefits, and possible complications of the intervention; the  alternatives, including doing nothing; the risk(s) and benefit(s) of the alternative treatment(s) or procedure(s); and the risk(s) and benefit(s) of doing nothing. The patient was provided information about the general risks and possible complications associated with the procedure. These may include, but are not limited to: failure to achieve desired goals, infection, bleeding, organ or nerve damage, allergic reactions, paralysis, and death. In addition, the patient was informed of those risks and complications associated to Spine-related procedures, such as failure to decrease pain; infection (i.e.: Meningitis, epidural or intraspinal abscess); bleeding (i.e.: epidural hematoma, subarachnoid hemorrhage, or any other type of intraspinal or peri-dural bleeding); organ or nerve damage (i.e.: Any type of peripheral nerve, nerve root, or spinal cord injury) with subsequent damage to sensory, motor, and/or autonomic systems, resulting in permanent pain, numbness, and/or weakness of one or several areas of the body; allergic reactions; (i.e.: anaphylactic reaction); and/or death. Furthermore, the patient was informed of those risks and complications associated with the medications. These include, but are not limited to: allergic reactions (i.e.: anaphylactic or anaphylactoid reaction(s)); adrenal axis suppression; blood sugar elevation that in diabetics may result in ketoacidosis or comma;  water retention that in patients with history of congestive heart failure may result in shortness of breath, pulmonary edema, and decompensation with resultant heart failure; weight gain; swelling or edema; medication-induced neural toxicity; particulate matter embolism and blood vessel occlusion with resultant organ, and/or nervous system infarction; and/or aseptic necrosis of one or more joints. Finally, the patient was informed that Medicine is not an exact science; therefore, there is also the possibility of unforeseen or unpredictable risks and/or possible complications that may result in a catastrophic outcome. The patient indicated having understood very clearly. We have given the patient no guarantees and we have made no promises. Enough time was given to the patient to ask questions, all of which were answered to the patient's satisfaction. Mr. Soldner has indicated that he wanted to continue with the procedure. Attestation: I, the ordering provider, attest that I have discussed with the patient the benefits, risks, side-effects, alternatives, likelihood of achieving goals, and potential problems during recovery for the procedure that I have provided informed consent. Date  Time: 08/20/2022 10:56 AM   Pre-Procedure Preparation:  Monitoring: As per clinic protocol. Respiration, ETCO2, SpO2, BP, heart rate and rhythm monitor placed and checked for adequate function Safety Precautions: Patient was assessed for positional comfort and pressure points before starting the procedure. Time-out: I initiated and conducted the "Time-out" before starting the procedure, as per protocol. The patient was asked to participate by confirming the accuracy of the "Time Out" information. Verification of the correct person, site, and procedure were performed and confirmed by me, the nursing staff, and the patient. "Time-out" conducted as per Joint Commission's Universal Protocol (UP.01.01.01). Time: 1130 Start Time:  1130 hrs.  Description  Narrative of Procedure:          Rationale (medical necessity): procedure needed and proper for the diagnosis and/or treatment of the patient's medical symptoms and needs. Start Time: 1130 hrs. Safety Precautions: Aspiration looking for blood return was conducted prior to all injections. At no point did we inject any substances, as a needle was being advanced. No attempts were made at seeking any paresthesias. Safe injection practices and needle disposal techniques used. Medications properly checked for expiration dates. SDV (single dose vial) medications used. Description of procedure: Protocol guidelines were followed. The patient was assisted into a comfortable position. The target area was identified and the area prepped in  the usual manner. Skin & deeper tissues infiltrated with local anesthetic. Appropriate amount of time allowed to pass for local anesthetics to take effect. Using fluoroscopic guidance, the epidural needle was introduced through the skin, ipsilateral to the reported pain, and advanced to the target area. Posterior laminar os was contacted and the needle walked caudad, until the lamina was cleared. The ligamentum flavum was engaged and the epidural space identified using "loss-of-resistance technique" with 2-3 ml of PF-NaCl (0.9% NSS), in a 5cc dedicated LOR syringe. (See "Imaging guidance" below for use of contrast details.) Once proper needle placement was secured, and negative aspiration confirmed, the solution was injected in intermittent fashion, asking for systemic symptoms every 0.5cc. The needles were then removed and the area cleansed, making sure to leave some of the prepping solution back to take advantage of its long term bactericidal properties.  3 cc solution made of 1cc of preservative-free saline, 1 cc of 0.2% ropivacaine, 1 cc of Decadron 10 mg/cc.   Vitals:   08/20/22 1105 08/20/22 1128 08/20/22 1132 08/20/22 1134  BP: 122/84 127/85 127/78  123/72  Pulse: 90     Resp:  10 11 12   Temp: (!) 97.2 F (36.2 C)     TempSrc: Temporal     SpO2: 96% 97% 99% 98%  Weight: (!) 310 lb (140.6 kg)     Height: 6\' 3"  (1.905 m)        End Time: 1134 hrs.  Imaging Guidance (Spinal):          Type of Imaging Technique: Fluoroscopy Guidance (Spinal) Indication(s): Assistance in needle guidance and placement for procedures requiring needle placement in or near specific anatomical locations not easily accessible without such assistance. Exposure Time: Please see nurses notes. Contrast: Before injecting any contrast, we confirmed that the patient did not have an allergy to iodine, shellfish, or radiological contrast. Once satisfactory needle placement was completed at the desired level, radiological contrast was injected. Contrast injected under live fluoroscopy. No contrast complications. See chart for type and volume of contrast used. Fluoroscopic Guidance: I was personally present during the use of fluoroscopy. "Tunnel Vision Technique" used to obtain the best possible view of the target area. Parallax error corrected before commencing the procedure. "Direction-depth-direction" technique used to introduce the needle under continuous pulsed fluoroscopy. Once target was reached, antero-posterior, oblique, and lateral fluoroscopic projection used confirm needle placement in all planes. Images permanently stored in EMR. Interpretation: I personally interpreted the imaging intraoperatively. Adequate needle placement confirmed in multiple planes. Appropriate spread of contrast into desired area was observed. No evidence of afferent or efferent intravascular uptake. No intrathecal or subarachnoid spread observed. Permanent images saved into the patient's record.  Post-operative Assessment:  Post-procedure Vital Signs:  Pulse/HCG Rate: 9083 Temp: (!) 97.2 F (36.2 C) Resp: 12 BP: 123/72 SpO2: 98 %  EBL: None  Complications: No immediate  post-treatment complications observed by team, or reported by patient.  Note: The patient tolerated the entire procedure well. A repeat set of vitals were taken after the procedure and the patient was kept under observation following institutional policy, for this type of procedure. Post-procedural neurological assessment was performed, showing return to baseline, prior to discharge. The patient was provided with post-procedure discharge instructions, including a section on how to identify potential problems. Should any problems arise concerning this procedure, the patient was given instructions to immediately contact us, at any time, without hesitation. In any case, we plan to contact the patient by telephone for a follow-up status  report regarding this interventional procedure.  Comments:  No additional relevant information.  Plan of Care (POC)  Orders:  Orders Placed This Encounter  Procedures   DG PAIN CLINIC C-ARM 1-60 MIN NO REPORT    Intraoperative interpretation by procedural physician at [facility].    Standing Status:   Standing    Number of Occurrences:   1    Order Specific Question:   Reason for exam:    Answer:   Assistance in needle guidance and placement for procedures requiring needle placement in or near specific anatomical locations not easily accessible without such assistance.   Chronic Opioid Analgesic:  Hydrocodone extended release 10 mg twice daily, hydrocodone immediate release 10 mg 3 times daily as needed for breakthrough pain    Medications ordered for procedure: Meds ordered this encounter  Medications   iohexol (OMNIPAQUE) 180 MG/ML injection 10 mL    Must be Myelogram-compatible. If not available, you may substitute with a water-soluble, non-ionic, hypoallergenic, myelogram-compatible radiological contrast medium.   lidocaine (XYLOCAINE) 2 % (with pres) injection 400 mg   diazepam (VALIUM) tablet 10 mg    Make sure Flumazenil is available in the  pyxis when using this medication. If oversedation occurs, administer 0.2 mg IV over 15 sec. If after 45 sec no response, administer 0.2 mg again over 1 min; may repeat at 1 min intervals; not to exceed 4 doses (1 mg)   sodium chloride flush (NS) 0.9 % injection 1 mL   ropivacaine (PF) 2 mg/mL (0.2%) (NAROPIN) injection 1 mL   dexamethasone (DECADRON) injection 10 mg   Medications administered: We administered iohexol, lidocaine, diazepam, sodium chloride flush, ropivacaine (PF) 2 mg/mL (0.2%), and dexamethasone.  See the medical record for exact dosing, route, and time of administration.  Follow-up plan:   Return for Keep sch. appt.       Diagnostic cervical facet medial branch nerve blocks C3-C7 b/l 05/06/19- did not help Diagnostic thoracic facet medial branch nerve blocks T1-T2, consider bilateral occipital nerve block  Cervical/trapezius trigger point injections SPG block                   Recent Visits Date Type Provider Dept  07/19/22 Office Visit Edward Jolly, MD [facility]  Showing recent visits within past 90 days and meeting all other requirements Today's Visits Date Type Provider Dept  08/20/22 Procedure visit Edward Jolly, MD [facility]  Showing today's visits and meeting all other requirements Future Appointments Date Type Provider Dept  09/17/22 Appointment Edward Jolly, MD [facility]  10/18/22 Appointment Edward Jolly, MD [facility]  Showing future appointments within next 90 days and meeting all other requirements  Disposition: Discharge home  Discharge (Date  Time): 08/20/2022; 1142 hrs.   Primary Care Physician: Kandyce Rud, MD Location: [facility] Note by: Edward Jolly, MD (TTS technology used. I apologize for any typographical errors that were not detected and corrected.) Date: 08/20/2022; Time: 11:56 AM  Disclaimer:  Medicine is not an Visual merchandiser. The only guarantee in  medicine is that nothing is guaranteed. It is important to note that the decision to proceed with this intervention was based on the information collected from the patient. The Data and conclusions were drawn from the patient's questionnaire, the interview, and the physical examination. Because the information was provided in large part by the patient, it cannot be guaranteed that it has not been purposely or unconsciously manipulated. Every effort has been  made to obtain as much relevant data as possible for this evaluation. It is important to note that the conclusions that lead to this procedure are derived in large part from the available data. Always take into account that the treatment will also be dependent on availability of resources and existing treatment guidelines, considered by other Pain Management Practitioners as being common knowledge and practice, at the time of the intervention. For Medico-Legal purposes, it is also important to point out that variation in procedural techniques and pharmacological choices are the acceptable norm. The indications, contraindications, technique, and results of the above procedure should only be interpreted and judged by a Board-Certified Interventional Pain Specialist with extensive familiarity and expertise in the same exact procedure and technique.

## 2022-08-20 NOTE — Patient Instructions (Signed)

## 2022-08-21 ENCOUNTER — Telehealth: Payer: Self-pay

## 2022-08-21 NOTE — Telephone Encounter (Signed)
Post procedure phone call.  Patient states he iis doing well.

## 2022-09-17 ENCOUNTER — Ambulatory Visit: Payer: Medicare Other | Admitting: Student in an Organized Health Care Education/Training Program

## 2022-09-20 ENCOUNTER — Telehealth: Payer: Self-pay | Admitting: Student in an Organized Health Care Education/Training Program

## 2022-09-20 ENCOUNTER — Other Ambulatory Visit: Payer: Self-pay | Admitting: *Deleted

## 2022-09-20 DIAGNOSIS — M5414 Radiculopathy, thoracic region: Secondary | ICD-10-CM

## 2022-09-20 DIAGNOSIS — M5412 Radiculopathy, cervical region: Secondary | ICD-10-CM

## 2022-09-20 DIAGNOSIS — G894 Chronic pain syndrome: Secondary | ICD-10-CM

## 2022-09-20 MED ORDER — NORTRIPTYLINE HCL 25 MG PO CAPS
50.0000 mg | ORAL_CAPSULE | Freq: Every day | ORAL | 5 refills | Status: DC
Start: 2022-09-20 — End: 2023-02-05

## 2022-09-20 NOTE — Telephone Encounter (Signed)
Pharmacy called states they had sent over a request for patient nortiptyline prescription to be refill. Please give pharmacy a call. TY

## 2022-09-20 NOTE — Telephone Encounter (Signed)
Rx request sent to Dr. Naveira. 

## 2022-10-03 ENCOUNTER — Encounter: Payer: Self-pay | Admitting: Student in an Organized Health Care Education/Training Program

## 2022-10-03 ENCOUNTER — Ambulatory Visit
Admission: RE | Admit: 2022-10-03 | Discharge: 2022-10-03 | Disposition: A | Discharge status: Home / Self Care | Payer: Medicare Other | Source: Ambulatory Visit | Attending: Student in an Organized Health Care Education/Training Program | Admitting: Student in an Organized Health Care Education/Training Program

## 2022-10-03 ENCOUNTER — Ambulatory Visit
Payer: Medicare Other | Attending: Student in an Organized Health Care Education/Training Program | Admitting: Student in an Organized Health Care Education/Training Program

## 2022-10-03 VITALS — BP 131/93 | HR 87 | Temp 97.9°F | Resp 15 | Ht 75.0 in | Wt 299.0 lb

## 2022-10-03 DIAGNOSIS — M7918 Myalgia, other site: Secondary | ICD-10-CM | POA: Diagnosis not present

## 2022-10-03 DIAGNOSIS — G8929 Other chronic pain: Secondary | ICD-10-CM | POA: Diagnosis not present

## 2022-10-03 DIAGNOSIS — M542 Cervicalgia: Secondary | ICD-10-CM

## 2022-10-03 DIAGNOSIS — M5416 Radiculopathy, lumbar region: Secondary | ICD-10-CM | POA: Diagnosis not present

## 2022-10-03 MED ORDER — DIAZEPAM 5 MG PO TABS
ORAL_TABLET | ORAL | Status: AC
  Filled 2022-10-03: qty 1

## 2022-10-03 MED ORDER — LIDOCAINE HCL (PF) 2 % IJ SOLN
INTRAMUSCULAR | Status: AC
  Filled 2022-10-03: qty 10

## 2022-10-03 MED ORDER — ROPIVACAINE HCL 2 MG/ML IJ SOLN
INTRAMUSCULAR | Status: AC
  Filled 2022-10-03: qty 20

## 2022-10-03 MED ORDER — LIDOCAINE HCL 2 % IJ SOLN
20.0000 mL | Freq: Once | INTRAMUSCULAR | Status: AC
  Administered 2022-10-03: 100 mg

## 2022-10-03 MED ORDER — DEXAMETHASONE SODIUM PHOSPHATE 10 MG/ML IJ SOLN
10.0000 mg | Freq: Once | INTRAMUSCULAR | Status: AC
  Administered 2022-10-03: 10 mg

## 2022-10-03 MED ORDER — DIAZEPAM 5 MG PO TABS
5.0000 mg | ORAL_TABLET | ORAL | Status: AC
  Administered 2022-10-03: 5 mg via ORAL

## 2022-10-03 MED ORDER — IOHEXOL 180 MG/ML  SOLN
10.0000 mL | Freq: Once | INTRAMUSCULAR | Status: AC
  Administered 2022-10-03: 10 mL via EPIDURAL

## 2022-10-03 MED ORDER — IOHEXOL 180 MG/ML  SOLN
INTRAMUSCULAR | Status: AC
  Filled 2022-10-03: qty 20

## 2022-10-03 MED ORDER — SODIUM CHLORIDE (PF) 0.9 % IJ SOLN
INTRAMUSCULAR | Status: AC
  Filled 2022-10-03: qty 10

## 2022-10-03 MED ORDER — SODIUM CHLORIDE 0.9% FLUSH
2.0000 mL | Freq: Once | INTRAVENOUS | Status: AC
  Administered 2022-10-03: 2 mL

## 2022-10-03 MED ORDER — ROPIVACAINE HCL 2 MG/ML IJ SOLN
2.0000 mL | Freq: Once | INTRAMUSCULAR | Status: AC
  Administered 2022-10-03: 2 mL via EPIDURAL

## 2022-10-03 MED ORDER — DEXAMETHASONE SODIUM PHOSPHATE 10 MG/ML IJ SOLN
INTRAMUSCULAR | Status: AC
  Filled 2022-10-03: qty 1

## 2022-10-03 NOTE — Patient Instructions (Signed)
Pain Management Discharge Instructions  General Discharge Instructions :  If you need to reach your doctor call: Monday-Friday 8:00 am - 4:00 pm at [phone] or toll free [phone].  After clinic hours [phone] to have operator reach doctor.  Bring all of your medication bottles to all your appointments in the pain clinic.  To cancel or reschedule your appointment with Pain Management please remember to call 24 hours in advance to avoid a fee.  Refer to the educational materials which you have been given on: General Risks, I had my Procedure. Discharge Instructions, Post Sedation.  Post Procedure Instructions:  The drugs you were given will stay in your system until tomorrow, so for the next 24 hours you should not drive, make any legal decisions or drink any alcoholic beverages.  You may eat anything you prefer, but it is better to start with liquids then soups and crackers, and gradually work up to solid foods.  Please notify your doctor immediately if you have any unusual bleeding, trouble breathing or pain that is not related to your normal pain.  Depending on the type of procedure that was done, some parts of your body may feel week and/or numb.  This usually clears up by tonight or the next day.  Walk with the use of an assistive device or accompanied by an adult for the 24 hours.  You may use ice on the affected area for the first 24 hours.  Put ice in a Ziploc bag and cover with a towel and place against area 15 minutes on 15 minutes off.  You may switch to heat after 24 hours.Epidural Steroid Injection Patient Information  Description: The epidural space surrounds the nerves as they exit the spinal cord.  In some patients, the nerves can be compressed and inflamed by a bulging disc or a tight spinal canal (spinal stenosis).  By injecting steroids into the epidural space, we can bring irritated nerves into direct contact with a potentially helpful medication.  These  steroids act directly on the irritated nerves and can reduce swelling and inflammation which often leads to decreased pain.  Epidural steroids may be injected anywhere along the spine and from the neck to the low back depending upon the location of your pain.   After numbing the skin with local anesthetic (like Novocaine), a small needle is passed into the epidural space slowly.  You may experience a sensation of pressure while this is being done.  The entire block usually last less than 10 minutes.  Conditions which may be treated by epidural steroids:  Low back and leg pain Neck and arm pain Spinal stenosis Post-laminectomy syndrome Herpes zoster (shingles) pain Pain from compression fractures  Preparation for the injection:  Do not eat any solid food or dairy products within 8 hours of your appointment.  You may drink clear liquids up to 3 hours before appointment.  Clear liquids include water, black coffee, juice or soda.  No milk or cream please. You may take your regular medication, including pain medications, with a sip of water before your appointment  Diabetics should hold regular insulin (if taken separately) and take 1/2 normal NPH dos the morning of the procedure.  Carry some sugar containing items with you to your appointment. A driver must accompany you and be prepared to drive you home after your procedure.  Bring all your current medications with your. An IV may be inserted and sedation may be given at the discretion of the physician.     A blood pressure cuff, EKG and other monitors will often be applied during the procedure.  Some patients may need to have extra oxygen administered for a short period. You will be asked to provide medical information, including your allergies, prior to the procedure.  We must know immediately if you are taking blood thinners (like Coumadin/Warfarin)  Or if you are allergic to IV iodine contrast (dye). We must know if you could possible be  pregnant.  Possible side-effects: Bleeding from needle site Infection (rare, may require surgery) Nerve injury (rare) Numbness & tingling (temporary) Difficulty urinating (rare, temporary) Spinal headache ( a headache worse with upright posture) Light -headedness (temporary) Pain at injection site (several days) Decreased blood pressure (temporary) Weakness in arm/leg (temporary) Pressure sensation in back/neck (temporary)  Call if you experience: Fever/chills associated with headache or increased back/neck pain. Headache worsened by an upright position. New onset weakness or numbness of an extremity below the injection site Hives or difficulty breathing (go to the emergency room) Inflammation or drainage at the infection site Severe back/neck pain Any new symptoms which are concerning to you  Please note:  Although the local anesthetic injected can often make your back or neck feel good for several hours after the injection, the pain will likely return.  It takes 3-7 days for steroids to work in the epidural space.  You may not notice any pain relief for at least that one week.  If effective, we will often do a series of three injections spaced 3-6 weeks apart to maximally decrease your pain.  After the initial series, we generally will wait several months before considering a repeat injection of the same type.  If you have any questions, please call [phone] [facility] 

## 2022-10-03 NOTE — Progress Notes (Signed)
[place];PROVIDER NOTE: Interpretation of information contained herein should be left to medically-trained personnel. Specific patient instructions are provided elsewhere under "Patient Instructions" section of medical record. This document was created in part using STT-dictation technology, any transcriptional errors that may result from this process are unintentional.  Patient: Thomas Cole Type: Established DOB: [birth year] MRN: [id number] PCP: Kandyce Rud, MD  Service: Procedure DOS: 10/03/2022 Setting: Ambulatory Location: Ambulatory outpatient facility Delivery: Face-to-face Provider: Edward Jolly, MD Specialty: Interventional Pain Management Specialty designation: 09 Location: Outpatient facility Ref. Prov.: Kandyce Rud, MD    Primary Reason for Visit: Interventional Pain Management Treatment. CC: Back Pain   Procedure:           Type: Lumbar epidural steroid injection (LESI) (interlaminar) #1 (2024) and bilateral cervical trigger point injections done, for myofascial pain syndrome of cervical spine and trapezius Laterality: Left   Level:  L3-4 Level.  Imaging: Fluoroscopic guidance Anesthesia: Local anesthesia (1-2% Lidocaine) Anxiolysis: Oral Valium 5 mg Sedation:  minimal . DOS: 10/03/2022  Performed by: Edward Jolly, MD  Purpose: Diagnostic/Therapeutic Indications: Lumbar radicular pain of intraspinal etiology of more than 4 weeks that has failed to respond to conservative therapy and is severe enough to impact quality of life or function. 1. Lumbar radiculopathy (L>R)   2. Chronic radicular lumbar pain   3. Cervicalgia     NAS-11 Pain score:   Pre-procedure: 7 /10   Post-procedure: 7 /10      Position / Prep / Materials:  Position: Prone w/ head of the table raised (slight reverse trendelenburg) to facilitate breathing.  Prep solution: DuraPrep (Iodine Povacrylex [0.7% available iodine] and Isopropyl Alcohol, 74% w/w) Prep Area: Entire Posterior Lumbar Region  from lower scapular tip down to mid buttocks area and from flank to flank. Materials:  Tray: Epidural tray Needle(s):  Type: Epidural needle          Gauge (G):  22 Length: Regular (3.5-in) Qty: 1  Pre-op H&P Assessment:  Thomas Cole is a 52 y.o. (year old), male patient, seen today for interventional treatment. He  has a past surgical history that includes Knee surgery; Posterior fusion cervical spine; Cervical disc surgery; Appendectomy (12); Anterior cervical corpectomy (N/A, 09/15/2012); Cholecystectomy (N/A, 03/29/2017); Colonoscopy with propofol (N/A, 10/23/2019); and Esophagogastroduodenoscopy (egd) with propofol (N/A, 10/23/2019). Thomas Cole has a current medication list which includes the following prescription(s): carbidopa-levodopa, carbidopa-levodopa, vitamin d3, clonazepam, duloxetine, furosemide, hydrochlorothiazide, hydrocodone bitartrate er, hydrocodone-acetaminophen, losartan, metformin, metoprolol succinate, nortriptyline, omeprazole, phenytoin, pregabalin, sildenafil, tizanidine, ubrelvy, cyanocobalamin, hydrocodone-acetaminophen, and hydrocodone-acetaminophen. His primarily concern today is the Back Pain  Initial Vital Signs:  Pulse/HCG Rate: 87ECG Heart Rate: 87 Temp: 97.9 F (36.6 C) Resp: 17 BP: 128/78 SpO2: 99 %  BMI: Estimated body mass index is 37.37 kg/m as calculated from the following:   Height as of this encounter: 6\' 3"  (1.905 m).   Weight as of this encounter: 299 lb (135.6 kg).  Risk Assessment: Allergies: Reviewed. He is allergic to amitriptyline, bee venom, chlorhexidine, carbamazepine, meloxicam, morphine and codeine, and sulfa antibiotics.  Allergy Precautions: None required Coagulopathies: Reviewed. None identified.  Blood-thinner therapy: None at this time Active Infection(s): Reviewed. None identified. Thomas Cole is afebrile  Site Confirmation: Thomas Cole was asked to confirm the procedure and laterality before marking the site Procedure checklist:  Completed Consent: Before the procedure and under the influence of no sedative(s), amnesic(s), or anxiolytics, the patient was informed of the treatment options, risks and possible complications. To fulfill our ethical and legal obligations, as  recommended by the American Medical Association's Code of Ethics, I have informed the patient of my clinical impression; the nature and purpose of the treatment or procedure; the risks, benefits, and possible complications of the intervention; the alternatives, including doing nothing; the risk(s) and benefit(s) of the alternative treatment(s) or procedure(s); and the risk(s) and benefit(s) of doing nothing. The patient was provided information about the general risks and possible complications associated with the procedure. These may include, but are not limited to: failure to achieve desired goals, infection, bleeding, organ or nerve damage, allergic reactions, paralysis, and death. In addition, the patient was informed of those risks and complications associated to Spine-related procedures, such as failure to decrease pain; infection (i.e.: Meningitis, epidural or intraspinal abscess); bleeding (i.e.: epidural hematoma, subarachnoid hemorrhage, or any other type of intraspinal or peri-dural bleeding); organ or nerve damage (i.e.: Any type of peripheral nerve, nerve root, or spinal cord injury) with subsequent damage to sensory, motor, and/or autonomic systems, resulting in permanent pain, numbness, and/or weakness of one or several areas of the body; allergic reactions; (i.e.: anaphylactic reaction); and/or death. Furthermore, the patient was informed of those risks and complications associated with the medications. These include, but are not limited to: allergic reactions (i.e.: anaphylactic or anaphylactoid reaction(s)); adrenal axis suppression; blood sugar elevation that in diabetics may result in ketoacidosis or comma; water retention that in patients with history  of congestive heart failure may result in shortness of breath, pulmonary edema, and decompensation with resultant heart failure; weight gain; swelling or edema; medication-induced neural toxicity; particulate matter embolism and blood vessel occlusion with resultant organ, and/or nervous system infarction; and/or aseptic necrosis of one or more joints. Finally, the patient was informed that Medicine is not an exact science; therefore, there is also the possibility of unforeseen or unpredictable risks and/or possible complications that may result in a catastrophic outcome. The patient indicated having understood very clearly. We have given the patient no guarantees and we have made no promises. Enough time was given to the patient to ask questions, all of which were answered to the patient's satisfaction. Thomas Cole has indicated that he wanted to continue with the procedure. Attestation: I, the ordering provider, attest that I have discussed with the patient the benefits, risks, side-effects, alternatives, likelihood of achieving goals, and potential problems during recovery for the procedure that I have provided informed consent. Date  Time: 10/03/2022 11:01 AM  Pre-Procedure Preparation:  Monitoring: As per clinic protocol. Respiration, ETCO2, SpO2, BP, heart rate and rhythm monitor placed and checked for adequate function Safety Precautions: Patient was assessed for positional comfort and pressure points before starting the procedure. Time-out: I initiated and conducted the "Time-out" before starting the procedure, as per protocol. The patient was asked to participate by confirming the accuracy of the "Time Out" information. Verification of the correct person, site, and procedure were performed and confirmed by me, the nursing staff, and the patient. "Time-out" conducted as per Joint Commission's Universal Protocol (UP.01.01.01). Time: 1128  Description/Narrative of Procedure:          Target: Epidural  space via interlaminar opening, initially targeting the lower laminar border of the superior vertebral body. Region: Lumbar Approach: Percutaneous paravertebral  Rationale (medical necessity): procedure needed and proper for the diagnosis and/or treatment of the patient's medical symptoms and needs. Procedural Technique Safety Precautions: Aspiration looking for blood return was conducted prior to all injections. At no point did we inject any substances, as a needle was being advanced. No attempts  were made at seeking any paresthesias. Safe injection practices and needle disposal techniques used. Medications properly checked for expiration dates. SDV (single dose vial) medications used. Description of the Procedure: Protocol guidelines were followed. The procedure needle was introduced through the skin, ipsilateral to the reported pain, and advanced to the target area. Bone was contacted and the needle walked caudad, until the lamina was cleared. The epidural space was identified using "loss-of-resistance technique" with 2-3 ml of PF-NaCl (0.9% NSS), in a 5cc LOR glass syringe.  Vitals:   10/03/22 1111 10/03/22 1128 10/03/22 1136  BP: 128/78 (!) 124/95 (!) 131/93  Pulse: 87    Resp: 17 16 15   Temp: 97.9 F (36.6 C)    SpO2: 99% 98% 98%  Weight: 299 lb (135.6 kg)    Height: 6\' 3"  (1.905 m)         6.5 cc solution made of 3.5cc of preservative-free saline, 2 cc of 0.2% ropivacaine, 1 cc of Decadron 10 mg/cc.  Afterwards bilateral trigger point injections were for cervical paraspinal muscles and b/l trapezius.  Needling was performed and 0.5 to 1 cc of 0.2% ropivacaine was injected into each trigger point.   Start Time: 1128 hrs. End Time: 1132 hrs.  Imaging Guidance (Spinal):          Type of Imaging Technique: Fluoroscopy Guidance (Spinal) Indication(s): Assistance in needle guidance and placement for procedures requiring needle placement in or near specific anatomical locations not  easily accessible without such assistance. Exposure Time: Please see nurses notes. Contrast: Before injecting any contrast, we confirmed that the patient did not have an allergy to iodine, shellfish, or radiological contrast. Once satisfactory needle placement was completed at the desired level, radiological contrast was injected. Contrast injected under live fluoroscopy. No contrast complications. See chart for type and volume of contrast used. Fluoroscopic Guidance: I was personally present during the use of fluoroscopy. "Tunnel Vision Technique" used to obtain the best possible view of the target area. Parallax error corrected before commencing the procedure. "Direction-depth-direction" technique used to introduce the needle under continuous pulsed fluoroscopy. Once target was reached, antero-posterior, oblique, and lateral fluoroscopic projection used confirm needle placement in all planes. Images permanently stored in EMR. Interpretation: I personally interpreted the imaging intraoperatively. Adequate needle placement confirmed in multiple planes. Appropriate spread of contrast into desired area was observed. No evidence of afferent or efferent intravascular uptake. No intrathecal or subarachnoid spread observed. Permanent images saved into the patient's record.   Post-operative Assessment:  Post-procedure Vital Signs:  Pulse/HCG Rate: 8785 Temp: 97.9 F (36.6 C) Resp: 15 BP: (!) 131/93 SpO2: 98 %  EBL: None  Complications: No immediate post-treatment complications observed by team, or reported by patient.  Note: The patient tolerated the entire procedure well. A repeat set of vitals were taken after the procedure and the patient was kept under observation following institutional policy, for this type of procedure. Post-procedural neurological assessment was performed, showing return to baseline, prior to discharge. The patient was provided with post-procedure discharge instructions,  including a section on how to identify potential problems. Should any problems arise concerning this procedure, the patient was given instructions to immediately contact us, at any time, without hesitation. In any case, we plan to contact the patient by telephone for a follow-up status report regarding this interventional procedure.  Comments:  No additional relevant information.  Plan of Care  Orders:  Orders Placed This Encounter  Procedures   DG PAIN CLINIC C-ARM 1-60 MIN NO REPORT  Intraoperative interpretation by procedural physician at [facility].    Standing Status:   Standing    Number of Occurrences:   1    Order Specific Question:   Reason for exam:    Answer:   Assistance in needle guidance and placement for procedures requiring needle placement in or near specific anatomical locations not easily accessible without such assistance.   Chronic Opioid Analgesic:  Hydrocodone extended release 10 mg twice daily, hydrocodone immediate release 10 mg 3 times daily as needed for breakthrough pain    Medications ordered for procedure: Meds ordered this encounter  Medications   iohexol (OMNIPAQUE) 180 MG/ML injection 10 mL    Must be Myelogram-compatible. If not available, you may substitute with a water-soluble, non-ionic, hypoallergenic, myelogram-compatible radiological contrast medium.   lidocaine (XYLOCAINE) 2 % (with pres) injection 400 mg   diazepam (VALIUM) tablet 5 mg    Make sure Flumazenil is available in the pyxis when using this medication. If oversedation occurs, administer 0.2 mg IV over 15 sec. If after 45 sec no response, administer 0.2 mg again over 1 min; may repeat at 1 min intervals; not to exceed 4 doses (1 mg)   sodium chloride flush (NS) 0.9 % injection 2 mL   ropivacaine (PF) 2 mg/mL (0.2%) (NAROPIN) injection 2 mL   dexamethasone (DECADRON) injection 10 mg   Medications administered: We administered iohexol, lidocaine, diazepam, sodium chloride  flush, ropivacaine (PF) 2 mg/mL (0.2%), and dexamethasone.  See the medical record for exact dosing, route, and time of administration.  Follow-up plan:   Return for keep 10/18/2022 appointment.      Recent Visits Date Type Provider Dept  08/20/22 Procedure visit Edward Jolly, MD [facility]  07/19/22 Office Visit Edward Jolly, MD [facility]  Showing recent visits within past 90 days and meeting all other requirements Today's Visits Date Type Provider Dept  10/03/22 Procedure visit Edward Jolly, MD [facility]  Showing today's visits and meeting all other requirements Future Appointments Date Type Provider Dept  10/18/22 Appointment Edward Jolly, MD [facility]  Showing future appointments within next 90 days and meeting all other requirements  Disposition: Discharge home  Discharge (Date  Time): 10/03/2022; 1145 hrs.   Primary Care Physician: Kandyce Rud, MD Location: [facility] Note by: Edward Jolly, MD Date: 10/03/2022; Time: 11:42 AM  Disclaimer:  Medicine is not an exact science. The only guarantee in medicine is that nothing is guaranteed. It is important to note that the decision to proceed with this intervention was based on the information collected from the patient. The Data and conclusions were drawn from the patient's questionnaire, the interview, and the physical examination. Because the information was provided in large part by the patient, it cannot be guaranteed that it has not been purposely or unconsciously manipulated. Every effort has been made to obtain as much relevant data as possible for this evaluation. It is important to note that the conclusions that lead to this procedure are derived in large part from the available data. Always take into account that the treatment will also be dependent on availability of resources and existing treatment guidelines, considered by other Pain  Management Practitioners as being common knowledge and practice, at the time of the intervention. For Medico-Legal purposes, it is also important to point out that variation in procedural techniques and pharmacological choices are the acceptable norm. The indications, contraindications, technique, and results of the above procedure should only  be interpreted and judged by a Board-Certified Interventional Pain Specialist with extensive familiarity and expertise in the same exact procedure and technique.

## 2022-10-03 NOTE — Progress Notes (Signed)
Safety precautions to be maintained throughout the outpatient stay will include: orient to surroundings, keep bed in low position, maintain call bell within reach at all times, provide assistance with transfer out of bed and ambulation.  

## 2022-10-12 ENCOUNTER — Other Ambulatory Visit: Payer: Self-pay | Admitting: Student in an Organized Health Care Education/Training Program

## 2022-10-12 DIAGNOSIS — G894 Chronic pain syndrome: Secondary | ICD-10-CM

## 2022-10-12 DIAGNOSIS — M5412 Radiculopathy, cervical region: Secondary | ICD-10-CM

## 2022-10-12 DIAGNOSIS — M5414 Radiculopathy, thoracic region: Secondary | ICD-10-CM

## 2022-10-17 ENCOUNTER — Other Ambulatory Visit: Payer: Self-pay | Admitting: Student in an Organized Health Care Education/Training Program

## 2022-10-17 DIAGNOSIS — G959 Disease of spinal cord, unspecified: Secondary | ICD-10-CM

## 2022-10-17 DIAGNOSIS — M5414 Radiculopathy, thoracic region: Secondary | ICD-10-CM

## 2022-10-17 DIAGNOSIS — G894 Chronic pain syndrome: Secondary | ICD-10-CM

## 2022-10-18 ENCOUNTER — Encounter: Payer: Self-pay | Admitting: Student in an Organized Health Care Education/Training Program

## 2022-10-18 ENCOUNTER — Ambulatory Visit
Payer: Medicare Other | Attending: Student in an Organized Health Care Education/Training Program | Admitting: Student in an Organized Health Care Education/Training Program

## 2022-10-18 VITALS — BP 130/76 | HR 94 | Temp 97.9°F | Resp 16 | Ht 74.0 in | Wt 285.0 lb

## 2022-10-18 DIAGNOSIS — M5412 Radiculopathy, cervical region: Secondary | ICD-10-CM

## 2022-10-18 DIAGNOSIS — G894 Chronic pain syndrome: Secondary | ICD-10-CM | POA: Diagnosis present

## 2022-10-18 DIAGNOSIS — M5416 Radiculopathy, lumbar region: Secondary | ICD-10-CM | POA: Diagnosis present

## 2022-10-18 DIAGNOSIS — G8929 Other chronic pain: Secondary | ICD-10-CM | POA: Insufficient documentation

## 2022-10-18 DIAGNOSIS — M5414 Radiculopathy, thoracic region: Secondary | ICD-10-CM

## 2022-10-18 DIAGNOSIS — M48061 Spinal stenosis, lumbar region without neurogenic claudication: Secondary | ICD-10-CM | POA: Diagnosis not present

## 2022-10-18 DIAGNOSIS — M542 Cervicalgia: Secondary | ICD-10-CM

## 2022-10-18 DIAGNOSIS — M5481 Occipital neuralgia: Secondary | ICD-10-CM

## 2022-10-18 DIAGNOSIS — Z9889 Other specified postprocedural states: Secondary | ICD-10-CM

## 2022-10-18 DIAGNOSIS — G959 Disease of spinal cord, unspecified: Secondary | ICD-10-CM | POA: Diagnosis present

## 2022-10-18 MED ORDER — HYDROCODONE BITARTRATE ER 10 MG PO CP12
10.0000 mg | ORAL_CAPSULE | Freq: Two times a day (BID) | ORAL | 0 refills | Status: AC
Start: 2022-11-20 — End: 2022-12-20

## 2022-10-18 MED ORDER — HYDROCODONE-ACETAMINOPHEN 10-325 MG PO TABS
1.0000 | ORAL_TABLET | Freq: Three times a day (TID) | ORAL | 0 refills | Status: DC | PRN
Start: 2022-10-21 — End: 2023-02-05

## 2022-10-18 MED ORDER — HYDROCODONE BITARTRATE ER 10 MG PO CP12
10.0000 mg | ORAL_CAPSULE | Freq: Two times a day (BID) | ORAL | 0 refills | Status: AC
Start: 2022-12-20 — End: 2023-01-19

## 2022-10-18 MED ORDER — HYDROCODONE-ACETAMINOPHEN 10-325 MG PO TABS
1.0000 | ORAL_TABLET | Freq: Three times a day (TID) | ORAL | 0 refills | Status: DC | PRN
Start: 2022-12-20 — End: 2023-01-11

## 2022-10-18 MED ORDER — HYDROCODONE BITARTRATE ER 10 MG PO CP12
10.0000 mg | ORAL_CAPSULE | Freq: Two times a day (BID) | ORAL | 0 refills | Status: AC
Start: 2022-10-21 — End: 2022-11-20

## 2022-10-18 MED ORDER — PREGABALIN 100 MG PO CAPS
100.0000 mg | ORAL_CAPSULE | Freq: Three times a day (TID) | ORAL | 5 refills | Status: DC
Start: 2022-10-18 — End: 2023-02-05

## 2022-10-18 MED ORDER — DULOXETINE HCL 60 MG PO CPEP
60.0000 mg | ORAL_CAPSULE | Freq: Every day | ORAL | 5 refills | Status: DC
Start: 2022-10-18 — End: 2023-02-05

## 2022-10-18 MED ORDER — HYDROCODONE-ACETAMINOPHEN 10-325 MG PO TABS
1.0000 | ORAL_TABLET | Freq: Three times a day (TID) | ORAL | 0 refills | Status: DC | PRN
Start: 2022-11-20 — End: 2023-02-05

## 2022-10-18 NOTE — Progress Notes (Signed)
PROVIDER NOTE: Information contained herein reflects review and annotations entered in association with encounter. Interpretation of such information and data should be left to medically-trained personnel. Information provided to patient can be located elsewhere in the medical record under "Patient Instructions". Document created using STT-dictation technology, any transcriptional errors that may result from process are unintentional.    Patient: Thomas Cole  Service Category: E/M  Provider: Edward Jolly, MD  DOB: December 16, 1970  DOS: 10/18/2022  Specialty: Interventional Pain Management  MRN: [id number]  Setting: Ambulatory outpatient  PCP: Thomas Rud, MD  Type: Established Patient    Referring Provider: Kandyce Rud, MD  Location: Office  Delivery: Face-to-face     HPI  Thomas Cole, a 52 y.o. year old male, is here today because of his Bilateral occipital neuralgia [M54.81]. Thomas Cole primary complain today is Neck Pain  Last encounter: My last encounter with him was on 10/03/22  Pertinent problems: Thomas Cole has Spondylosis, cervical, with myelopathy; Migraines; History of seizures; Generalized anxiety disorder; and Bilateral occipital neuralgia on their pertinent problem list. Pain Assessment: Severity of Chronic pain is reported as a 7 /10. Location: Neck Right/left arm not right, wrong side. Onset: More than a month ago. Quality: Aching, Constant, Burning, Discomfort, Tiring. Timing: Constant. Modifying factor(s): inctions. Vitals:  height is 6\' 2"  (1.88 m) and weight is 285 lb (129.3 kg). His temperature is 97.9 F (36.6 C). His blood pressure is 130/76 and his pulse is 94. His respiration is 16 and oxygen saturation is 98%.   Reason for encounter: both, medication management and post-procedure assessment.  [name] presents today for medication management.    He is having increased occipital neuralgia.  He is finding benefit after his previous lumbar epidural steroid injection  and states that he has less low back pain and radiating leg pain.  We discussed greater occipital nerve block for his bilateral occipital neuralgia.  Otherwise we will refill his chronic pain medications as below.     Post-procedure evaluation   Type: Lumbar epidural steroid injection (LESI) (interlaminar) #1 (2024) and bilateral cervical trigger point injections done, for myofascial pain syndrome of cervical spine and trapezius Laterality: Left   Level:  L3-4 Level.  Imaging: Fluoroscopic guidance Anesthesia: Local anesthesia (1-2% Lidocaine) Anxiolysis: Oral Valium 5 mg Sedation:  minimal . DOS: 10/03/2022  Performed by: Edward Jolly, MD  Purpose: Diagnostic/Therapeutic Indications: Lumbar radicular pain of intraspinal etiology of more than 4 weeks that has failed to respond to conservative therapy and is severe enough to impact quality of life or function. 1. Lumbar radiculopathy (L>R)   2. Chronic radicular lumbar pain   3. Cervicalgia     NAS-11 Pain score:   Pre-procedure: 7 /10   Post-procedure: 7 /10      Effectiveness:  Initial hour after procedure: 100 %  Subsequent 4-6 hours post-procedure: 50 %  Analgesia past initial 6 hours: 50 % (current)  Ongoing improvement:  Analgesic: 50% Function: Somewhat improved ROM: Somewhat improved   Pharmacotherapy Assessment  Analgesic: Hydrocodone extended release 10 mg twice daily, hydrocodone immediate release 10 mg 3 times daily as needed for breakthrough pain    Monitoring:Hydrocodone extended release 10 mg twice daily, hydrocodone immediate release 10 mg 3 times daily as needed for breakthrough pain   [place] PMP: PDMP reviewed during this encounter.       Pharmacotherapy: No side-effects or adverse reactions reported. Compliance: No problems identified. Effectiveness: Clinically acceptable.  UDS:  Summary  Date Value Ref Range Status  10/17/2021 Note  Final    Comment:     ==================================================================== Compliance Drug Analysis, Ur ==================================================================== Test                             Result       Flag       Units  Drug Present and Declared for Prescription Verification   7-aminoclonazepam              96           EXPECTED   ng/mg creat    7-aminoclonazepam is an expected metabolite of clonazepam. Source of    clonazepam is a scheduled prescription medication.    Hydrocodone                    758          EXPECTED   ng/mg creat   Hydromorphone                  87           EXPECTED   ng/mg creat   Dihydrocodeine                 263          EXPECTED   ng/mg creat   Norhydrocodone                 1508         EXPECTED   ng/mg creat    Sources of hydrocodone include scheduled prescription medications.    Hydromorphone, dihydrocodeine and norhydrocodone are expected    metabolites of hydrocodone. Hydromorphone and dihydrocodeine are    also available as scheduled prescription medications.    Pregabalin                     PRESENT      EXPECTED   Tizanidine                     PRESENT      EXPECTED   Duloxetine                     PRESENT      EXPECTED   Nortriptyline                  PRESENT      EXPECTED    Nortriptyline may be administered as a prescription drug; it is also    an expected metabolite of amitriptyline.    Acetaminophen                  PRESENT      EXPECTED  Drug Absent but Declared for Prescription Verification   Phenytoin                      Not Detected UNEXPECTED   Metoprolol                     Not Detected UNEXPECTED ==================================================================== Test                      Result    Flag   Units      Ref Range   Creatinine              76  mg/dL      >=96 ==================================================================== Declared Medications:  The flagging and interpretation on this report  are based on the  following declared medications.  Unexpected results may arise from  inaccuracies in the declared medications.   **Note: The testing scope of this panel includes these medications:   Clonazepam (Klonopin)  Duloxetine (Cymbalta)  Hydrocodone  Hydrocodone (Norco)  Metoprolol (Toprol)  Nortriptyline (Pamelor)  Phenytoin (Dilantin)  Pregabalin (Lyrica)   **Note: The testing scope of this panel does not include small to  moderate amounts of these reported medications:   Acetaminophen (Norco)  Tizanidine (Zanaflex)   **Note: The testing scope of this panel does not include the  following reported medications:   Carbidopa (Sinemet)  Cyanocobalamin  Furosemide (Lasix)  Glipizide  Hydrochlorothiazide (Hydrodiuril)  Levodopa (Sinemet)  Losartan (Cozaar)  Metformin (Glucophage)  Omeprazole (Prilosec)  Ubrogepant Bernita Raisin)  Vitamin D3 ==================================================================== For clinical consultation, please call [phone]. ====================================================================       ROS  Constitutional: Denies any fever or chills Gastrointestinal: No reported hemesis, hematochezia, vomiting, or acute GI distress Musculoskeletal: Occipital pain Neurological: No reported episodes of acute onset apraxia, aphasia, dysarthria, agnosia, amnesia, paralysis, loss of coordination, or loss of consciousness  Medication Review  DULoxetine, HYDROcodone Bitartrate ER, HYDROcodone-acetaminophen, Vitamin D3, carbidopa-levodopa, clonazePAM, cyanocobalamin, furosemide, hydrochlorothiazide, losartan, metFORMIN, metoprolol succinate, nortriptyline, omeprazole, phenytoin, pregabalin, sildenafil, and tiZANidine  History Review  Allergy: Thomas Cole is allergic to amitriptyline, bee venom, chlorhexidine, carbamazepine, meloxicam, morphine and codeine, and sulfa antibiotics. Drug: Thomas Cole  reports no history of drug use. Alcohol:   reports no history of alcohol use. Tobacco:  reports that he has never smoked. He has never used smokeless tobacco. Social: Thomas Cole  reports that he has never smoked. He has never used smokeless tobacco. He reports that he does not drink alcohol and does not use drugs. Medical:  has a past medical history of Anxiety, Asthma, Back problem, Benign essential hypertension (11/30/2016), Cervical spondylosis without myelopathy (09/17/2012), Complication of anesthesia, Depression, Diabetes mellitus without complication (HCC) (02/2020), GERD (gastroesophageal reflux disease), Hypertension, Labile hypertension (03/19/2013), Major depressive disorder, recurrent episode, severe (HCC) (06/23/2015), Migraine headache, Migraines (11/11/2013), S/P appendectomy (04/09/2011), Seizure (HCC) (11/30/2016), Seizures (HCC), Shortness of breath, and SOB (shortness of breath) (08/06/2014). Surgical: Thomas Cole  has a past surgical history that includes Knee surgery; Posterior fusion cervical spine; Cervical disc surgery; Appendectomy (12); Anterior cervical corpectomy (N/A, 09/15/2012); Cholecystectomy (N/A, 03/29/2017); Colonoscopy with propofol (N/A, 10/23/2019); and Esophagogastroduodenoscopy (egd) with propofol (N/A, 10/23/2019). Family: family history includes Arthritis in his father, maternal grandfather, maternal grandmother, mother, paternal grandfather, and paternal grandmother; Asthma in his mother; Coronary artery disease in his father and mother; Diabetes in his father, paternal grandfather, and paternal grandmother; Heart disease in his father, maternal grandfather, maternal grandmother, paternal grandfather, and paternal grandmother; Hypertension in his maternal grandfather, maternal grandmother, mother, paternal grandfather, and paternal grandmother; Mental illness in his mother.  Laboratory Chemistry Profile   Renal Lab Results  Component Value Date   BUN 13 04/21/2017   CREATININE 0.93 04/21/2017   LABCREA 79.3  03/05/2014   GFR 82.19 08/05/2014   GFRAA >60 04/21/2017   GFRNONAA >60 04/21/2017     Hepatic Lab Results  Component Value Date   AST 30 04/21/2017   ALT 23 04/21/2017   ALBUMIN 4.2 04/21/2017   ALKPHOS 86 04/21/2017   LIPASE 51 04/21/2017     Electrolytes Lab Results  Component Value Date   NA 138 04/21/2017   K 3.7  04/21/2017   CL 101 04/21/2017   CALCIUM 9.4 04/21/2017   MG 1.9 04/26/2020     Bone Lab Results  Component Value Date   VD25OH 31.95 03/05/2016   25OHVITD1 35 04/26/2020   25OHVITD2 <1.0 04/26/2020   25OHVITD3 35 04/26/2020     Inflammation (CRP: Acute Phase) (ESR: Chronic Phase) Lab Results  Component Value Date   CRP 10 04/26/2020   ESRSEDRATE 22 04/26/2020       Note: Above Lab results reviewed.   Physical Exam  General appearance: Well nourished, well developed, and well hydrated. In no apparent acute distress Mental status: Alert, oriented x 3 (person, place, & time)       Respiratory: No evidence of acute respiratory distress Eyes: PERLA Vitals: BP 130/76   Pulse 94   Temp 97.9 F (36.6 C)   Resp 16   Ht 6\' 2"  (1.88 m)   Wt 285 lb (129.3 kg)   SpO2 98%   BMI 36.59 kg/m  BMI: Estimated body mass index is 36.59 kg/m as calculated from the following:   Height as of this encounter: 6\' 2"  (1.88 m).   Weight as of this encounter: 285 lb (129.3 kg). Ideal: Ideal body weight: 82.2 kg (181 lb 3.5 oz) Adjusted ideal body weight: 101 kg (222 lb 11.7 oz)  Cervical Spine Area Exam  Skin & Axial Inspection: Well healed scar from previous spine surgery detected Alignment: Symmetrical Functional ROM: Pain restricted ROM, bilaterally Stability: No instability detected Muscle Tone/Strength: Functionally intact. No obvious neuro-muscular anomalies detected. Sensory (Neurological): neurogenic Palpation: No palpable anomalies               Upper Extremity (UE) Exam      Side: Right upper extremity   Side: Left upper extremity  Skin &  Extremity Inspection: Skin color, temperature, and hair growth are WNL. No peripheral edema or cyanosis. No masses, redness, swelling, asymmetry, or associated skin lesions. No contractures.   Skin & Extremity Inspection: Skin color, temperature, and hair growth are WNL. No peripheral edema or cyanosis. No masses, redness, swelling, asymmetry, or associated skin lesions. No contractures.  Functional ROM: Pain restricted ROM for shoulder and elbow   Functional ROM: Pain restricted ROM for shoulder and elbow  Muscle Tone/Strength: Functionally intact. No obvious neuro-muscular anomalies detected.   Muscle Tone/Strength: Functionally intact. No obvious neuro-muscular anomalies detected.  Sensory (Neurological): Unimpaired           Sensory (Neurological): Unimpaired          Palpation: No palpable anomalies               Palpation: No palpable anomalies              Provocative Test(s):  Phalen's test: deferred Tinel's test: deferred Apley's scratch test (touch opposite shoulder):  Action 1 (Across chest): Decreased ROM Action 2 (Overhead): Decreased ROM Action 3 (LB reach): Decreased ROM       Provocative Test(s):  Phalen's test: deferred Tinel's test: deferred Apley's scratch test (touch opposite shoulder):  Action 1 (Across chest): Decreased ROM Action 2 (Overhead): Decreased ROM Action 3 (LB reach): Decreased ROM     Lumbar Spine Area Exam  Skin & Axial Inspection: No masses, redness, or swelling Alignment: Symmetrical Functional ROM: Pain restricted ROM Stability: No instability detected Muscle Tone/Strength: Functionally intact. No obvious neuro-muscular anomalies detected. Sensory (Neurological): Dermatomal pain pattern left greater than right; improved after LESI   Gait & Posture Assessment  Ambulation: Patient came  in today in a wheel chair Gait: Antalgic gait (limping) Posture: Difficulty standing up straight, due to pain   Lower Extremity Exam    Side: Right lower  extremity  Side: Left lower extremity  Stability: No instability observed          Stability: No instability observed          Skin & Extremity Inspection: Skin color, temperature, and hair growth are WNL. No peripheral edema or cyanosis. No masses, redness, swelling, asymmetry, or associated skin lesions. No contractures.  Skin & Extremity Inspection: Skin color, temperature, and hair growth are WNL. No peripheral edema or cyanosis. No masses, redness, swelling, asymmetry, or associated skin lesions. No contractures.  Functional ROM: Unrestricted ROM                  Functional ROM: Pain restricted ROM for hip and knee joints,   Muscle Tone/Strength: Functionally intact. No obvious neuro-muscular anomalies detected.  Muscle Tone/Strength: Functionally intact. No obvious neuro-muscular anomalies detected.  Sensory (Neurological): Unimpaired        Sensory (Neurological): Dermatomal pain pattern        DTR: Patellar: deferred today Achilles: deferred today Plantar: deferred today  DTR: Patellar: deferred today Achilles: deferred today Plantar: deferred today  Palpation: No palpable anomalies  Palpation: No palpable anomalies     Assessment   Status Diagnosis  Having a Flare-up Controlled Controlled 1. Bilateral occipital neuralgia   2. Cervicalgia   3. Cervical radicular pain   4. Neuroforaminal stenosis of lumbar spine (L3,4,5)   5. History of lumbar surgery (1994 L4/5 discetomy)   6. Chronic radicular lumbar pain   7. Lumbar radiculopathy (L>R)   8. Thoracic radiculopathy   9. Chronic pain syndrome   10. Myelopathy of cervical spinal cord with cervical radiculopathy (HCC)         Plan of Care  Thomas Cole has a current medication list which includes the following long-term medication(s): carbidopa-levodopa, carbidopa-levodopa, clonazepam, metformin, nortriptyline, sildenafil, duloxetine, hydrocodone-acetaminophen, [START ON 10/21/2022] hydrocodone-acetaminophen, [START ON  11/20/2022] hydrocodone-acetaminophen, [START ON 12/20/2022] hydrocodone-acetaminophen, metoprolol succinate, and pregabalin.  Pharmacotherapy (Medications Ordered): Meds ordered this encounter  Medications   HYDROcodone Bitartrate ER 10 MG CP12    Sig: Take 10 mg by mouth every 12 (twelve) hours. Must last 30 days.    Dispense:  60 capsule    Refill:  0    Chronic Pain: STOP Act (Not applicable) Fill 1 day early if closed on refill date. Avoid benzodiazepines within 8 hours of opioids   HYDROcodone Bitartrate ER 10 MG CP12    Sig: Take 10 mg by mouth every 12 (twelve) hours. Must last 30 days.    Dispense:  60 capsule    Refill:  0    Chronic Pain: STOP Act (Not applicable) Fill 1 day early if closed on refill date. Avoid benzodiazepines within 8 hours of opioids   HYDROcodone Bitartrate ER 10 MG CP12    Sig: Take 10 mg by mouth every 12 (twelve) hours. Must last 30 days.    Dispense:  60 capsule    Refill:  0    Chronic Pain: STOP Act (Not applicable) Fill 1 day early if closed on refill date. Avoid benzodiazepines within 8 hours of opioids   HYDROcodone-acetaminophen (NORCO) 10-325 MG tablet    Sig: Take 1 tablet by mouth every 8 (eight) hours as needed. For chronic pain syndrome. Each Rx to last 30 days.    Dispense:  90  tablet    Refill:  0   HYDROcodone-acetaminophen (NORCO) 10-325 MG tablet    Sig: Take 1 tablet by mouth every 8 (eight) hours as needed. For chronic pain syndrome. Each Rx to last 30 days.    Dispense:  90 tablet    Refill:  0   HYDROcodone-acetaminophen (NORCO) 10-325 MG tablet    Sig: Take 1 tablet by mouth every 8 (eight) hours as needed. For chronic pain syndrome. Each Rx to last 30 days.    Dispense:  90 tablet    Refill:  0   DULoxetine (CYMBALTA) 60 MG capsule    Sig: Take 1 capsule (60 mg total) by mouth daily after breakfast.    Dispense:  30 capsule    Refill:  5   pregabalin (LYRICA) 100 MG capsule    Sig: Take 1 capsule (100 mg total) by mouth 3  (three) times daily.    Dispense:  90 capsule    Refill:  5    Do not place this medication, or any other prescription from our practice, on "Automatic Refill". Patient may have prescription filled one day early if pharmacy is closed on scheduled refill date.     Orders Placed This Encounter  Procedures   GREATER OCCIPITAL NERVE BLOCK    Standing Status:   Future    Standing Expiration Date:   01/18/2023    Scheduling Instructions:     Procedure: Occipital nerve block     Laterality: Bilateral     Sedation: PO Valium 10 mg     Timeframe: ASAA    Order Specific Question:   Where will this procedure be performed?    Answer:   [facility]     Follow-up plan:   Return in about 18 days (around 11/05/2022) for B/L GONB, in clinic (PO Valium).    Recent Visits Date Type Provider Dept  10/03/22 Procedure visit Edward Jolly, MD [facility]  08/20/22 Procedure visit Edward Jolly, MD [facility]  Showing recent visits within past 90 days and meeting all other requirements Today's Visits Date Type Provider Dept  10/18/22 Office Visit Edward Jolly, MD [facility]  Showing today's visits and meeting all other requirements Future Appointments No visits were found meeting these conditions. Showing future appointments within next 90 days and meeting all other requirements  I discussed the assessment and treatment plan with the patient. The patient was provided an opportunity to ask questions and all were answered. The patient agreed with the plan and demonstrated an understanding of the instructions.  Patient advised to call back or seek an in-person evaluation if the symptoms or condition worsens.  Duration of encounter: 30 minutes.  Note by: Edward Jolly, MD Date: 10/18/2022; Time: 3:04 PM

## 2022-10-18 NOTE — Progress Notes (Signed)
Nursing Pain Medication Assessment:  Safety precautions to be maintained throughout the outpatient stay will include: orient to surroundings, keep bed in low position, maintain call bell within reach at all times, provide assistance with transfer out of bed and ambulation.  Medication Inspection Compliance: Pill count conducted under aseptic conditions, in front of the patient. Neither the pills nor the bottle was removed from the patient's sight at any time. Once count was completed pills were immediately returned to the patient in their original bottle.  Medication: Hydrocodone/APAP Pill/Patch Count:  19 of unknown pills remain Pill/Patch Appearance: Markings consistent with prescribed medication Bottle Appearance: Standard pharmacy container. Clearly labeled. Filled Date: 06 / 16 / 2024 Last Medication intake:  Today  Hydrocodone Bit 13 pills remain, unknown quantity. Pt with pill pack. Last took today.

## 2022-10-18 NOTE — Patient Instructions (Addendum)
Patient is stable ____________________________________________________________________________________________  General Risks and Possible Complications  Patient Responsibilities: It is important that you read this as it is part of your informed consent. It is our duty to inform you of the risks and possible complications associated with treatments offered to you. It is your responsibility as a patient to read this and to ask questions about anything that is not clear or that you believe was not covered in this document.  Patient's Rights: You have the right to refuse treatment. You also have the right to change your mind, even after initially having agreed to have the treatment done. However, under this last option, if you wait until the last second to change your mind, you may be charged for the materials used up to that point.  Introduction: Medicine is not an Visual merchandiser. Everything in Medicine, including the lack of treatment(s), carries the potential for danger, harm, or loss (which is by definition: Risk). In Medicine, a complication is a secondary problem, condition, or disease that can aggravate an already existing one. All treatments carry the risk of possible complications. The fact that a side effects or complications occurs, does not imply that the treatment was conducted incorrectly. It must be clearly understood that these can happen even when everything is done following the highest safety standards.  No treatment: You can choose not to proceed with the proposed treatment alternative. The "PRO(s)" would include: avoiding the risk of complications associated with the therapy. The "CON(s)" would include: not getting any of the treatment benefits. These benefits fall under one of three categories: diagnostic; therapeutic; and/or palliative. Diagnostic benefits include: getting information which can ultimately lead to improvement of the disease or symptom(s). Therapeutic benefits are those  associated with the successful treatment of the disease. Finally, palliative benefits are those related to the decrease of the primary symptoms, without necessarily curing the condition (example: decreasing the pain from a flare-up of a chronic condition, such as incurable terminal cancer).  General Risks and Complications: These are associated to most interventional treatments. They can occur alone, or in combination. They fall under one of the following six (6) categories: no benefit or worsening of symptoms; bleeding; infection; nerve damage; allergic reactions; and/or death. No benefits or worsening of symptoms: In Medicine there are no guarantees, only probabilities. No healthcare provider can ever guarantee that a medical treatment will work, they can only state the probability that it may. Furthermore, there is always the possibility that the condition may worsen, either directly, or indirectly, as a consequence of the treatment. Bleeding: This is more common if the patient is taking a blood thinner, either prescription or over the counter (example: Goody Powders, Fish oil, Aspirin, Garlic, etc.), or if suffering a condition associated with impaired coagulation (example: Hemophilia, cirrhosis of the liver, low platelet counts, etc.). However, even if you do not have one on these, it can still happen. If you have any of these conditions, or take one of these drugs, make sure to notify your treating physician. Infection: This is more common in patients with a compromised immune system, either due to disease (example: diabetes, cancer, human immunodeficiency virus [HIV], etc.), or due to medications or treatments (example: therapies used to treat cancer and rheumatological diseases). However, even if you do not have one on these, it can still happen. If you have any of these conditions, or take one of these drugs, make sure to notify your treating physician. Nerve Damage: This is more common when the  treatment is an invasive one, but it can also happen with the use of medications, such as those used in the treatment of cancer. The damage can occur to small secondary nerves, or to large primary ones, such as those in the spinal cord and brain. This damage may be temporary or permanent and it may lead to impairments that can range from temporary numbness to permanent paralysis and/or brain death. Allergic Reactions: Any time a substance or material comes in contact with our body, there is the possibility of an allergic reaction. These can range from a mild skin rash (contact dermatitis) to a severe systemic reaction (anaphylactic reaction), which can result in death. Death: In general, any medical intervention can result in death, most of the time due to an unforeseen complication. ____________________________________________________________________________________________    ______________________________________________________________________  Preparing for your procedure  Appointments: If you think you may not be able to keep your appointment, call 24-48 hours in advance to cancel. We need time to make it available to others.  During your procedure appointment there will be: No Prescription Refills. No disability issues to discussed. No medication changes or discussions.  Instructions: Food intake: Avoid eating anything solid for at least 8 hours prior to your procedure. Clear liquid intake: You may take clear liquids such as water up to 2 hours prior to your procedure. (No carbonated drinks. No soda.) Transportation: Unless otherwise stated by your physician, bring a driver. Morning Medicines: Except for blood thinners, take all of your other morning medications with a sip of water. Make sure to take your heart and blood pressure medicines. If your blood pressure's lower number is above 100, the case will be rescheduled. Blood thinners: Make sure to stop your blood thinners as  instructed.  If you take a blood thinner, but were not instructed to stop it, call our office [phone] and ask to talk to a nurse. Not stopping a blood thinner prior to certain procedures could lead to serious complications. Diabetics on insulin: Notify the staff so that you can be scheduled 1st case in the morning. If your diabetes requires high dose insulin, take only  of your normal insulin dose the morning of the procedure and notify the staff that you have done so. Preventing infections: Shower with an antibacterial soap the morning of your procedure.  Build-up your immune system: Take 1000 mg of Vitamin C with every meal (3 times a day) the day prior to your procedure. Antibiotics: Inform the nursing staff if you are taking any antibiotics or if you have any conditions that may require antibiotics prior to procedures. (Example: recent joint implants)   Pregnancy: If you are pregnant make sure to notify the nursing staff. Not doing so may result in injury to the fetus, including death.  Sickness: If you have a cold, fever, or any active infections, call and cancel or reschedule your procedure. Receiving steroids while having an infection may result in complications. Arrival: You must be in the facility at least 30 minutes prior to your scheduled procedure. Tardiness: Your scheduled time is also the cutoff time. If you do not arrive at least 15 minutes prior to your procedure, you will be rescheduled.  Children: Do not bring any children with you. Make arrangements to keep them home. Dress appropriately: There is always a possibility that your clothing may get soiled. Avoid long dresses. Valuables: Do not bring any jewelry or valuables.  Reasons to call and reschedule or cancel your procedure: (Following these recommendations will  minimize the risk of a serious complication.) Surgeries: Avoid having procedures within 2 weeks of any surgery. (Avoid for 2 weeks before or after any  surgery). Flu Shots: Avoid having procedures within 2 weeks of a flu shots or . (Avoid for 2 weeks before or after immunizations). Barium: Avoid having a procedure within 7-10 days after having had a radiological study involving the use of radiological contrast. (Myelograms, Barium swallow or enema study). Heart attacks: Avoid any elective procedures or surgeries for the initial 6 months after a "Myocardial Infarction" (Heart Attack). Blood thinners: It is imperative that you stop these medications before procedures. Let us know if you if you take any blood thinner.  Infection: Avoid procedures during or within two weeks of an infection (including chest colds or gastrointestinal problems). Symptoms associated with infections include: Localized redness, fever, chills, night sweats or profuse sweating, burning sensation when voiding, cough, congestion, stuffiness, runny nose, sore throat, diarrhea, nausea, vomiting, cold or Flu symptoms, recent or current infections. It is specially important if the infection is over the area that we intend to treat. Heart and lung problems: Symptoms that may suggest an active cardiopulmonary problem include: cough, chest pain, breathing difficulties or shortness of breath, dizziness, ankle swelling, uncontrolled high or unusually low blood pressure, and/or palpitations. If you are experiencing any of these symptoms, cancel your procedure and contact your primary care physician for an evaluation.  Remember:  Regular Business hours are:  Monday to Thursday 8:00 AM to 4:00 PM  Provider's Schedule: Delano Metz, MD:  Procedure days: Tuesday and Thursday 7:30 AM to 4:00 PM  Edward Jolly, MD:  Procedure days: Monday and Wednesday 7:30 AM to 4:00 PM  ______________________________________________________________________   Occipital Nerve Block Patient Information  Description: The occipital nerves originate in the cervical (neck) spinal cord and travel upward  through muscle and tissue to supply sensation to the back of the head and top of the scalp.  In addition, the nerves control some of the muscles of the scalp.  Occipital neuralgia is an irritation of these nerves which can cause headaches, numbness of the scalp, and neck discomfort.     The occipital nerve block will interrupt nerve transmission through these nerves and can relieve pain and spasm.  The block consists of insertion of a small needle under the skin in the back of the head to deposit local anesthetic (numbing medicine) and/or steroids around the nerve.  The entire block usually lasts less than 5 minutes.  Conditions which may be treated by occipital blocks:  Muscular pain and spasm of the scalp Nerve irritation, back of the head Headaches Upper neck pain  Preparation for the injection:  Do not eat any solid food or dairy products within 8 hours of your appointment. You may drink clear liquids up to 3 hours before appointment.  Clear liquids include water, black coffee, juice or soda.  No milk or cream please. You may take your regular medication, including pain medications, with a sip of water before you appointment.  Diabetics should hold regular insulin (if taken separately) and take 1/2 normal NPH dose the morning of the procedure.  Carry some sugar containing items with you to your appointment. A driver must accompany you and be prepared to drive you home after your procedure. Bring all your current medications with you. An IV may be inserted and sedation may be given at the discretion of the physician. A blood pressure cuff, EKG, and other monitors will often be applied during  the procedure.  Some patients may need to have extra oxygen administered for a short period. You will be asked to provide medical information, including your allergies and medications, prior to the procedure.  We must know immediately if you are taking blood thinners (like Coumadin/Warfarin) or if you are  allergic to IV iodine contrast (dye).  We must know if you could possible be pregnant.  Do not wear a high collared shirt or turtleneck.  Tie long hair up in the back if possible.  Possible side-effects:  Bleeding from needle site Infection (rare, may require surgery) Nerve injury (rare) Hair on back of neck can be tinged with iodine scrub (this will wash out) Light-headedness (temporary) Pain at injection site (several days) Decreased blood pressure (rare, temporary) Seizure (very rare)  Call if you experience:  Hives or difficulty breathing ( go to the emergency room) Inflammation or drainage at the injection site(s)  Please note:  Although the local anesthetic injected can often make your painful muscles or headache feel good for several hours after the injection, the pain may return.  It takes 3-7 days for steroids to work.  You may not notice any pain relief for at least one week.  If effective, we will often do a series of injections spaced 3-6 weeks apart to maximally decrease your pain.  If you have any questions, please call [phone] [facility]

## 2022-10-22 NOTE — Telephone Encounter (Signed)
His pharmacy is out of oxycodone. They want [place] to [facility] [place]. They have the tablets but need extended release [facility] [place].

## 2022-10-23 ENCOUNTER — Telehealth: Payer: Self-pay | Admitting: Student in an Organized Health Care Education/Training Program

## 2022-10-23 ENCOUNTER — Other Ambulatory Visit: Payer: Self-pay | Admitting: *Deleted

## 2022-10-23 DIAGNOSIS — M5412 Radiculopathy, cervical region: Secondary | ICD-10-CM

## 2022-10-23 DIAGNOSIS — G894 Chronic pain syndrome: Secondary | ICD-10-CM

## 2022-10-23 DIAGNOSIS — M5414 Radiculopathy, thoracic region: Secondary | ICD-10-CM

## 2022-10-23 MED ORDER — HYDROCODONE BITARTRATE ER 10 MG PO CP12
10.0000 mg | ORAL_CAPSULE | Freq: Two times a day (BID) | ORAL | 0 refills | Status: AC
Start: 2022-10-23 — End: 2022-11-22

## 2022-10-23 NOTE — Telephone Encounter (Signed)
Confirmed with pharmacy that they do not have the Hydrocodone Bitartrate stock. Rx request sent to Dr. Cherylann Ratel.

## 2022-10-23 NOTE — Telephone Encounter (Signed)
Pharmacy called stated that hydrocodone capsule is back order they aren't sure when some will be available. [facility] [place] ,[place].Please give pharmacy a call. ASAP

## 2022-10-23 NOTE — Telephone Encounter (Signed)
Spoke with Morrie Sheldon at [facility] to let her know that I spoke with Dr Cherylann Ratel and he does not want to do anything different at this time with patient medications.  States there is a Geneticist, molecular, when it becomes available he can get it filled at that time.

## 2022-10-23 NOTE — Telephone Encounter (Signed)
Patient notified per voicemail. 

## 2022-10-24 ENCOUNTER — Telehealth: Payer: Self-pay | Admitting: *Deleted

## 2022-10-24 ENCOUNTER — Telehealth: Payer: Self-pay | Admitting: Student in an Organized Health Care Education/Training Program

## 2022-10-24 NOTE — Telephone Encounter (Signed)
You sent a new Rx for Hydrocodone Bitartrate to a new pharmacy yesterday, they also do not have it in stock. [facility] is asking if you will prescribe an alternative long acting medication because this is becoming difficult to obtain.

## 2022-10-24 NOTE — Telephone Encounter (Signed)
PT called states that he was inform by the pharmacy that hydrocodone is on back order. Pharmacy doesn't know when they will have the prescription in. PT states that he only has enough pills until Friday. PT stated that pharmacy tech Morrie Sheldon told him that Dr Cherylann Ratel stated that he will just be without medication. Was told that doctor stated that he will have to thur the withdrawls. Please give patient a call. TY

## 2022-10-24 NOTE — Telephone Encounter (Signed)
Notified patient.

## 2022-10-24 NOTE — Telephone Encounter (Signed)
Informed patient that a prescription for Hydrocodone Bitartrate was [facility] yesterday.

## 2022-10-24 NOTE — Telephone Encounter (Signed)
Called [place] back re; hydrocodone bitartrate shortage and the fact the [facility] is not going to send anything else in place of that. When stock arrives he can then fill it.  Also, let him know that [facility] would discuss different options with him at his August appt. Patient verbalizes u/o information.

## 2022-11-05 ENCOUNTER — Ambulatory Visit
Payer: Medicare Other | Attending: Student in an Organized Health Care Education/Training Program | Admitting: Student in an Organized Health Care Education/Training Program

## 2022-11-05 ENCOUNTER — Encounter: Payer: Self-pay | Admitting: Student in an Organized Health Care Education/Training Program

## 2022-11-05 VITALS — BP 111/72 | HR 80 | Temp 97.8°F | Resp 16 | Ht 74.0 in | Wt 285.0 lb

## 2022-11-05 DIAGNOSIS — M5481 Occipital neuralgia: Secondary | ICD-10-CM | POA: Diagnosis present

## 2022-11-05 MED ORDER — DIAZEPAM 5 MG PO TABS
ORAL_TABLET | ORAL | Status: AC
  Filled 2022-11-05: qty 2

## 2022-11-05 MED ORDER — LIDOCAINE HCL 2 % IJ SOLN
INTRAMUSCULAR | Status: AC
  Filled 2022-11-05: qty 20

## 2022-11-05 MED ORDER — DEXAMETHASONE SODIUM PHOSPHATE 10 MG/ML IJ SOLN
INTRAMUSCULAR | Status: AC
  Filled 2022-11-05: qty 1

## 2022-11-05 MED ORDER — ROPIVACAINE HCL 2 MG/ML IJ SOLN
9.0000 mL | Freq: Once | INTRAMUSCULAR | Status: AC
  Administered 2022-11-05: 9 mL via PERINEURAL

## 2022-11-05 MED ORDER — DIAZEPAM 5 MG PO TABS
10.0000 mg | ORAL_TABLET | ORAL | Status: AC
  Administered 2022-11-05: 10 mg via ORAL

## 2022-11-05 MED ORDER — ROPIVACAINE HCL 2 MG/ML IJ SOLN
INTRAMUSCULAR | Status: AC
  Filled 2022-11-05: qty 20

## 2022-11-05 MED ORDER — DEXAMETHASONE SODIUM PHOSPHATE 10 MG/ML IJ SOLN
10.0000 mg | Freq: Once | INTRAMUSCULAR | Status: AC
  Administered 2022-11-05: 10 mg

## 2022-11-05 NOTE — Progress Notes (Signed)
PROVIDER NOTE: Interpretation of information contained herein should be left to medically-trained personnel. Specific patient instructions are provided elsewhere under "Patient Instructions" section of medical record. This document was created in part using STT-dictation technology, any transcriptional errors that may result from this process are unintentional.  Patient: Thomas Cole Type: Established DOB: 08-Nov-1970 MRN: [id number] PCP: Kandyce Rud, MD  Service: Procedure DOS: 11/05/2022 Setting: Ambulatory Location: Ambulatory outpatient facility Delivery: Face-to-face Provider: Edward Jolly, MD Specialty: Interventional Pain Management Specialty designation: 09 Location: Outpatient facility Ref. Prov.: Edward Jolly, MD       Interventional Therapy   Primary Reason for Visit: Interventional Pain Management Treatment. CC: headaches    Procedure:          Anesthesia, Analgesia, Anxiolysis:  Type: Diagnostic, Greater, Occipital Nerve Block  #1  Region: Posterolateral Cervical Level: Occipital Ridge   Laterality: Bilateral  Anesthesia: Local (1-2% Lidocaine)  Anxiolysis: None  Sedation: None     Position: Prone   1. Bilateral occipital neuralgia    NAS-11 Pain score:   Pre-procedure: 6 /10   Post-procedure: 4 /10     H&P (Pre-op Assessment):  Thomas Cole is a 52 y.o. (year old), male patient, seen today for interventional treatment. He  has a past surgical history that includes Knee surgery; Posterior fusion cervical spine; Cervical disc surgery; Appendectomy (12); Anterior cervical corpectomy (N/A, 09/15/2012); Cholecystectomy (N/A, 03/29/2017); Colonoscopy with propofol (N/A, 10/23/2019); and Esophagogastroduodenoscopy (egd) with propofol (N/A, 10/23/2019). Thomas Cole has a current medication list which includes the following prescription(s): carbidopa-levodopa, vitamin d3, clonazepam, duloxetine, furosemide, hydrochlorothiazide, [START ON 11/20/2022] hydrocodone bitartrate er,  [START ON 12/20/2022] hydrocodone bitartrate er, hydrocodone bitartrate er, hydrocodone-acetaminophen, [START ON 11/20/2022] hydrocodone-acetaminophen, [START ON 12/20/2022] hydrocodone-acetaminophen, losartan, metformin, nortriptyline, omeprazole, phenytoin, pregabalin, sildenafil, tizanidine, cyanocobalamin, carbidopa-levodopa, hydrocodone-acetaminophen, and metoprolol succinate. His primarily concern today is the No chief complaint on file.  Initial Vital Signs:  Pulse/HCG Rate: 83  Temp: 97.8 F (36.6 C) Resp: 17 BP: 119/74 SpO2: 100 %  BMI: Estimated body mass index is 36.59 kg/m as calculated from the following:   Height as of this encounter: 6\' 2"  (1.88 m).   Weight as of this encounter: 285 lb (129.3 kg).  Risk Assessment: Allergies: Reviewed. He is allergic to amitriptyline, bee venom, chlorhexidine, carbamazepine, meloxicam, morphine and codeine, and sulfa antibiotics.  Allergy Precautions: None required Coagulopathies: Reviewed. None identified.  Blood-thinner therapy: None at this time Active Infection(s): Reviewed. None identified. Thomas Cole is afebrile  Site Confirmation: Thomas Cole was asked to confirm the procedure and laterality before marking the site Procedure checklist: Completed Consent: Before the procedure and under the influence of no sedative(s), amnesic(s), or anxiolytics, the patient was informed of the treatment options, risks and possible complications. To fulfill our ethical and legal obligations, as recommended by the American Medical Association's Code of Ethics, I have informed the patient of my clinical impression; the nature and purpose of the treatment or procedure; the risks, benefits, and possible complications of the intervention; the alternatives, including doing nothing; the risk(s) and benefit(s) of the alternative treatment(s) or procedure(s); and the risk(s) and benefit(s) of doing nothing. The patient was provided information about the general risks  and possible complications associated with the procedure. These may include, but are not limited to: failure to achieve desired goals, infection, bleeding, organ or nerve damage, allergic reactions, paralysis, and death. In addition, the patient was informed of those risks and complications associated to the procedure, such as failure to decrease pain; infection; bleeding;  organ or nerve damage with subsequent damage to sensory, motor, and/or autonomic systems, resulting in permanent pain, numbness, and/or weakness of one or several areas of the body; allergic reactions; (i.e.: anaphylactic reaction); and/or death. Furthermore, the patient was informed of those risks and complications associated with the medications. These include, but are not limited to: allergic reactions (i.e.: anaphylactic or anaphylactoid reaction(s)); adrenal axis suppression; blood sugar elevation that in diabetics may result in ketoacidosis or comma; water retention that in patients with history of congestive heart failure may result in shortness of breath, pulmonary edema, and decompensation with resultant heart failure; weight gain; swelling or edema; medication-induced neural toxicity; particulate matter embolism and blood vessel occlusion with resultant organ, and/or nervous system infarction; and/or aseptic necrosis of one or more joints. Finally, the patient was informed that Medicine is not an exact science; therefore, there is also the possibility of unforeseen or unpredictable risks and/or possible complications that may result in a catastrophic outcome. The patient indicated having understood very clearly. We have given the patient no guarantees and we have made no promises. Enough time was given to the patient to ask questions, all of which were answered to the patient's satisfaction. Thomas Cole has indicated that he wanted to continue with the procedure. Attestation: I, the ordering provider, attest that I have discussed with  the patient the benefits, risks, side-effects, alternatives, likelihood of achieving goals, and potential problems during recovery for the procedure that I have provided informed consent. Date  Time: 11/05/2022 10:58 AM  Pre-Procedure Preparation:  Monitoring: As per clinic protocol. Respiration, ETCO2, SpO2, BP, heart rate and rhythm monitor placed and checked for adequate function Safety Precautions: Patient was assessed for positional comfort and pressure points before starting the procedure. Time-out: I initiated and conducted the "Time-out" before starting the procedure, as per protocol. The patient was asked to participate by confirming the accuracy of the "Time Out" information. Verification of the correct person, site, and procedure were performed and confirmed by me, the nursing staff, and the patient. "Time-out" conducted as per Joint Commission's Universal Protocol (UP.01.01.01). Time: 1136 Start Time: 1136 hrs.  Description of Procedure:          Target Area: Area medial to the occipital artery at the level of the superior nuchal ridge Approach: Posterior approach Area Prepped: Entire Posterior Occipital Region DuraPrep (Iodine Povacrylex [0.7% available iodine] and Isopropyl Alcohol, 74% w/w) Safety Precautions: Aspiration looking for blood return was conducted prior to all injections. At no point did we inject any substances, as a needle was being advanced. No attempts were made at seeking any paresthesias. Safe injection practices and needle disposal techniques used. Medications properly checked for expiration dates. SDV (single dose vial) medications used. Description of the Procedure: Protocol guidelines were followed. The target area was identified and the area prepped in the usual manner. Skin & deeper tissues infiltrated with local anesthetic. Appropriate amount of time allowed to pass for local anesthetics to take effect. The procedure needles were then advanced to the target  area. Proper needle placement secured. Negative aspiration confirmed. Solution injected in intermittent fashion, asking for systemic symptoms every 0.5cc of injectate. The needles were then removed and the area cleansed, making sure to leave some of the prepping solution back to take advantage of its long term bactericidal properties.  Vitals:   11/05/22 1118 11/05/22 1131 11/05/22 1141  BP: 119/74 107/73 111/72  Pulse: 83 79 80  Resp:  17 16  Temp: 97.8 F (36.6 C)  SpO2: 100% 98% 99%  Weight: 285 lb (129.3 kg)    Height: 6\' 2"  (1.88 m)      Start Time: 1136 hrs. End Time: 1139 hrs. Materials:  Needle(s) Type: Spinal Needle Gauge: 22G Length: 1.5-in Medication(s): Please see orders for medications and dosing details.  10 cc solution made of 9 cc of 0.2% ropivacaine, 1 cc of Decadron 10 mg/cc. 5 cc injected for left and right GON    Antibiotic Prophylaxis:   Anti-infectives (From admission, onward)    None      Indication(s): None identified  Post-operative Assessment:  Post-procedure Vital Signs:  Pulse/HCG Rate: 80  Temp: 97.8 F (36.6 C) Resp: 16 BP: 111/72 SpO2: 99 %  EBL: None  Complications: No immediate post-treatment complications observed by team, or reported by patient.  Note: The patient tolerated the entire procedure well. A repeat set of vitals were taken after the procedure and the patient was kept under observation following institutional policy, for this type of procedure. Post-procedural neurological assessment was performed, showing return to baseline, prior to discharge. The patient was provided with post-procedure discharge instructions, including a section on how to identify potential problems. Should any problems arise concerning this procedure, the patient was given instructions to immediately contact us, at any time, without hesitation. In any case, we plan to contact the patient by telephone for a follow-up status report regarding this  interventional procedure.  Comments:  No additional relevant information.  Plan of Care (POC)  Orders:  No orders of the defined types were placed in this encounter.  Chronic Opioid Analgesic:  Hydrocodone extended release 10 mg twice daily, hydrocodone immediate release 10 mg 3 times daily as needed for breakthrough pain    Medications ordered for procedure: Meds ordered this encounter  Medications   diazepam (VALIUM) tablet 10 mg    Make sure Flumazenil is available in the pyxis when using this medication. If oversedation occurs, administer 0.2 mg IV over 15 sec. If after 45 sec no response, administer 0.2 mg again over 1 min; may repeat at 1 min intervals; not to exceed 4 doses (1 mg)   dexamethasone (DECADRON) injection 10 mg   ropivacaine (PF) 2 mg/mL (0.2%) (NAROPIN) injection 9 mL   Medications administered: We administered diazepam, dexamethasone, and ropivacaine (PF) 2 mg/mL (0.2%).  See the medical record for exact dosing, route, and time of administration.  Follow-up plan:   Return in about 3 weeks (around 11/26/2022) for VV PPE.       Diagnostic cervical facet medial branch nerve blocks C3-C7 b/l 05/06/19- did not help Diagnostic thoracic facet medial branch nerve blocks T1-T2, consider bilateral occipital nerve block  Cervical/trapezius trigger point injections SPG block                    Recent Visits Date Type Provider Dept  10/18/22 Office Visit Edward Jolly, MD [facility]  10/03/22 Procedure visit Edward Jolly, MD [facility]  08/20/22 Procedure visit Edward Jolly, MD [facility]  Showing recent visits within past 90 days and meeting all other requirements Today's Visits Date Type Provider Dept  11/05/22 Procedure visit Edward Jolly, MD [facility]  Showing today's visits and meeting all other requirements Future Appointments Date Type Provider Dept  11/26/22 Appointment Edward Jolly, MD [facility]   01/17/23 Appointment Edward Jolly, MD [facility]  Showing future appointments within next 90 days and meeting all other requirements  Disposition: Discharge home  Discharge (Date  Time): 11/05/2022; 1145 hrs.   Primary Care Physician: Kandyce Rud, MD Location: [facility] Note by: Edward Jolly, MD (TTS technology used. I apologize for any typographical errors that were not detected and corrected.) Date: 11/05/2022; Time: 1:27 PM  Disclaimer:  Medicine is not an Visual merchandiser. The only guarantee in medicine is that nothing is guaranteed. It is important to note that the decision to proceed with this intervention was based on the information collected from the patient. The Data and conclusions were drawn from the patient's questionnaire, the interview, and the physical examination. Because the information was provided in large part by the patient, it cannot be guaranteed that it has not been purposely or unconsciously manipulated. Every effort has been made to obtain as much relevant data as possible for this evaluation. It is important to note that the conclusions that lead to this procedure are derived in large part from the available data. Always take into account that the treatment will also be dependent on availability of resources and existing treatment guidelines, considered by other Pain Management Practitioners as being common knowledge and practice, at the time of the intervention. For Medico-Legal purposes, it is also important to point out that variation in procedural techniques and pharmacological choices are the acceptable norm. The indications, contraindications, technique, and results of the above procedure should only be interpreted and judged by a Board-Certified Interventional Pain Specialist with extensive familiarity and expertise in the same exact procedure and technique.

## 2022-11-05 NOTE — Patient Instructions (Signed)

## 2022-11-06 ENCOUNTER — Telehealth: Payer: Self-pay

## 2022-11-06 NOTE — Telephone Encounter (Signed)
Post procedure follow up..  Patient states he is doing good 

## 2022-11-22 ENCOUNTER — Telehealth: Payer: Self-pay

## 2022-11-26 ENCOUNTER — Encounter: Payer: Self-pay | Admitting: Student in an Organized Health Care Education/Training Program

## 2022-11-26 ENCOUNTER — Ambulatory Visit
Payer: Medicare Other | Attending: Student in an Organized Health Care Education/Training Program | Admitting: Student in an Organized Health Care Education/Training Program

## 2022-11-26 DIAGNOSIS — M5481 Occipital neuralgia: Secondary | ICD-10-CM

## 2022-11-26 DIAGNOSIS — M542 Cervicalgia: Secondary | ICD-10-CM

## 2022-11-26 NOTE — Progress Notes (Signed)
Patient: Thomas Cole  Service Category: E/M  Provider: Edward Jolly, MD  DOB: 1971-03-17  DOS: 11/26/2022  Location: Office  MRN: [id number]  Setting: Ambulatory outpatient  Referring Provider: Kandyce Rud, MD  Type: Established Patient  Specialty: Interventional Pain Management  PCP: Kandyce Rud, MD  Location: Remote location  Delivery: TeleHealth     Virtual Encounter - Pain Management PROVIDER NOTE: Information contained herein reflects review and annotations entered in association with encounter. Interpretation of such information and data should be left to medically-trained personnel. Information provided to patient can be located elsewhere in the medical record under "Patient Instructions". Document created using STT-dictation technology, any transcriptional errors that may result from process are unintentional.    Contact & Pharmacy Preferred: [phone] Home: [phone] (home) Mobile: [phone] (mobile) E-mail: [email or web address]  [facility] - [place], [place] - [address] [place] [address] Phone: [phone] Fax: [phone]  [id number] - [place], [place] - [address] [place] [address] Phone: [phone] Fax: [phone]   Pre-screening  Mr. Tavano offered "in-person" vs "virtual" encounter. He indicated preferring virtual for this encounter.   Reason COVID-19*  Social distancing based on CDC and AMA recommendations.   I contacted Thomas Cole on 11/26/2022 via telephone.      I clearly identified myself as Edward Jolly, MD. I verified that I was speaking with the correct person using two identifiers (Name: Thomas Cole, and date of birth: Aug 01, 1970).  Consent I sought verbal advanced consent from Thomas Cole for virtual visit interactions. I informed Thomas Cole of possible security and privacy concerns, risks, and limitations associated with providing "not-in-person"  medical evaluation and management services. I also informed Thomas Cole of the availability of "in-person" appointments. Finally, I informed him that there would be a charge for the virtual visit and that he could be  personally, fully or partially, financially responsible for it. Thomas Cole expressed understanding and agreed to proceed.   Historic Elements   Thomas Cole is a 52 y.o. year old, male patient evaluated today after our last contact on 11/05/2022. Thomas Cole  has a past medical history of Anxiety, Asthma, Back problem, Benign essential hypertension (11/30/2016), Cervical spondylosis without myelopathy (09/17/2012), Complication of anesthesia, Depression, Diabetes mellitus without complication (HCC) (02/2020), GERD (gastroesophageal reflux disease), Hypertension, Labile hypertension (03/19/2013), Major depressive disorder, recurrent episode, severe (HCC) (06/23/2015), Migraine headache, Migraines (11/11/2013), S/P appendectomy (04/09/2011), Seizure (HCC) (11/30/2016), Seizures (HCC), Shortness of breath, and SOB (shortness of breath) (08/06/2014). He also  has a past surgical history that includes Knee surgery; Posterior fusion cervical spine; Cervical disc surgery; Appendectomy (12); Anterior cervical corpectomy (N/A, 09/15/2012); Cholecystectomy (N/A, 03/29/2017); Colonoscopy with propofol (N/A, 10/23/2019); and Esophagogastroduodenoscopy (egd) with propofol (N/A, 10/23/2019). Thomas Cole has a current medication list which includes the following prescription(s): carbidopa-levodopa, carbidopa-levodopa, vitamin d3, clonazepam, duloxetine, furosemide, hydrochlorothiazide, hydrocodone bitartrate er, [START ON 12/20/2022] hydrocodone bitartrate er, hydrocodone-acetaminophen, [START ON 12/20/2022] hydrocodone-acetaminophen, losartan, metformin, nortriptyline, omeprazole, phenytoin, pregabalin, sildenafil, tizanidine, cyanocobalamin, hydrocodone-acetaminophen, hydrocodone-acetaminophen, and metoprolol succinate. He   reports that he has never smoked. He has never used smokeless tobacco. He reports that he does not drink alcohol and does not use drugs. Thomas Cole is allergic to amitriptyline, bee venom, chlorhexidine, carbamazepine, meloxicam, morphine and codeine, and sulfa antibiotics.  BMI: Estimated body mass index is 36.59 kg/m as calculated from the following:   Height as of 11/05/22: 6\' 2"  (1.88 m).   Weight as of 11/05/22: 285 lb (  129.3 kg). Last encounter: 10/18/2022. Last procedure: 11/05/2022.  HPI  Today, he is being contacted for a post-procedure assessment.  Procedure:          Anesthesia, Analgesia, Anxiolysis:  Type: Diagnostic, Greater, Occipital Nerve Block  #1  Region: Posterolateral Cervical Level: Occipital Ridge   Laterality: Bilateral  Anesthesia: Local (1-2% Lidocaine)  Anxiolysis: None  Sedation: None     Position: Prone   1. Bilateral occipital neuralgia    NAS-11 Pain score:   Pre-procedure: 6 /10   Post-procedure: 4 /10      Effectiveness:  Initial hour after procedure: 100 %  Subsequent 4-6 hours post-procedure: 100 %  Analgesia past initial 6 hours: 50 % Ongoing improvement:  Analgesic:  50% Function: Thomas Cole reports improvement in function ROM: Thomas Cole reports improvement in ROM   Pharmacotherapy Assessment   Opioid Analgesic: Hydrocodone extended release 10 mg twice daily, hydrocodone immediate release 10 mg 3 times daily as needed for breakthrough pain    Monitoring: [place] PMP: PDMP reviewed during this encounter.       Pharmacotherapy: No side-effects or adverse reactions reported. Compliance: No problems identified. Effectiveness: Clinically acceptable. Plan: Refer to "POC". UDS:  Summary  Date Value Ref Range Status  10/17/2021 Note  Final    Comment:    ==================================================================== Compliance Drug Analysis, Ur ==================================================================== Test                              Result       Flag       Units  Drug Present and Declared for Prescription Verification   7-aminoclonazepam              96           EXPECTED   ng/mg creat    7-aminoclonazepam is an expected metabolite of clonazepam. Source of    clonazepam is a scheduled prescription medication.    Hydrocodone                    758          EXPECTED   ng/mg creat   Hydromorphone                  87           EXPECTED   ng/mg creat   Dihydrocodeine                 263          EXPECTED   ng/mg creat   Norhydrocodone                 1508         EXPECTED   ng/mg creat    Sources of hydrocodone include scheduled prescription medications.    Hydromorphone, dihydrocodeine and norhydrocodone are expected    metabolites of hydrocodone. Hydromorphone and dihydrocodeine are    also available as scheduled prescription medications.    Pregabalin                     PRESENT      EXPECTED   Tizanidine                     PRESENT      EXPECTED   Duloxetine  PRESENT      EXPECTED   Nortriptyline                  PRESENT      EXPECTED    Nortriptyline may be administered as a prescription drug; it is also    an expected metabolite of amitriptyline.    Acetaminophen                  PRESENT      EXPECTED  Drug Absent but Declared for Prescription Verification   Phenytoin                      Not Detected UNEXPECTED   Metoprolol                     Not Detected UNEXPECTED ==================================================================== Test                      Result    Flag   Units      Ref Range   Creatinine              76               mg/dL      >=10 ==================================================================== Declared Medications:  The flagging and interpretation on this report are based on the  following declared medications.  Unexpected results may arise from  inaccuracies in the declared medications.   **Note: The testing scope of this panel includes these  medications:   Clonazepam (Klonopin)  Duloxetine (Cymbalta)  Hydrocodone  Hydrocodone (Norco)  Metoprolol (Toprol)  Nortriptyline (Pamelor)  Phenytoin (Dilantin)  Pregabalin (Lyrica)   **Note: The testing scope of this panel does not include small to  moderate amounts of these reported medications:   Acetaminophen (Norco)  Tizanidine (Zanaflex)   **Note: The testing scope of this panel does not include the  following reported medications:   Carbidopa (Sinemet)  Cyanocobalamin  Furosemide (Lasix)  Glipizide  Hydrochlorothiazide (Hydrodiuril)  Levodopa (Sinemet)  Losartan (Cozaar)  Metformin (Glucophage)  Omeprazole (Prilosec)  Ubrogepant Bernita Raisin)  Vitamin D3 ==================================================================== For clinical consultation, please call [phone]. ====================================================================    No results found for: "CBDTHCR", "D8THCCBX", "D9THCCBX"   Laboratory Chemistry Profile   Renal Lab Results  Component Value Date   BUN 13 04/21/2017   CREATININE 0.93 04/21/2017   LABCREA 79.3 03/05/2014   GFR 82.19 08/05/2014   GFRAA >60 04/21/2017   GFRNONAA >60 04/21/2017    Hepatic Lab Results  Component Value Date   AST 30 04/21/2017   ALT 23 04/21/2017   ALBUMIN 4.2 04/21/2017   ALKPHOS 86 04/21/2017   LIPASE 51 04/21/2017    Electrolytes Lab Results  Component Value Date   NA 138 04/21/2017   K 3.7 04/21/2017   CL 101 04/21/2017   CALCIUM 9.4 04/21/2017   MG 1.9 04/26/2020    Bone Lab Results  Component Value Date   VD25OH 31.95 03/05/2016   25OHVITD1 35 04/26/2020   25OHVITD2 <1.0 04/26/2020   25OHVITD3 35 04/26/2020    Inflammation (CRP: Acute Phase) (ESR: Chronic Phase) Lab Results  Component Value Date   CRP 10 04/26/2020   ESRSEDRATE 22 04/26/2020         Note: Above Lab results reviewed.   Assessment  The primary encounter diagnosis was Bilateral occipital neuralgia. A  diagnosis of Cervicalgia was also pertinent to this visit.  Plan of Care  Positive response from his  bilateral greater occipital nerve block.  Notes reduction in occipital neuralgia.  He will continue to monitor his symptoms.  Future considerations include pulsed radiofrequency ablation of the occipital nerve versus peripheral nerve stimulation.  Keep follow-up as scheduled for October.  Follow-up plan:   Return for Keep sch. appt.      Diagnostic cervical facet medial branch nerve blocks C3-C7 b/l 05/06/19- did not help Diagnostic thoracic facet medial branch nerve blocks T1-T2, consider bilateral occipital nerve block  Cervical/trapezius trigger point injections SPG block                    Recent Visits Date Type Provider Dept  11/05/22 Procedure visit Edward Jolly, MD [facility]  10/18/22 Office Visit Edward Jolly, MD [facility]  10/03/22 Procedure visit Edward Jolly, MD [facility]  Showing recent visits within past 90 days and meeting all other requirements Today's Visits Date Type Provider Dept  11/26/22 Office Visit Edward Jolly, MD [facility]  Showing today's visits and meeting all other requirements Future Appointments Date Type Provider Dept  01/17/23 Appointment Edward Jolly, MD [facility]  Showing future appointments within next 90 days and meeting all other requirements  I discussed the assessment and treatment plan with the patient. The patient was provided an opportunity to ask questions and all were answered. The patient agreed with the plan and demonstrated an understanding of the instructions.  Patient advised to call back or seek an in-person evaluation if the symptoms or condition worsens.  Duration of encounter: .  Note by: Edward Jolly, MD Date: 11/26/2022; Time: 2:06 PM

## 2023-01-11 ENCOUNTER — Other Ambulatory Visit: Payer: Self-pay | Admitting: *Deleted

## 2023-01-11 ENCOUNTER — Telehealth: Payer: Self-pay | Admitting: Student in an Organized Health Care Education/Training Program

## 2023-01-11 DIAGNOSIS — M5412 Radiculopathy, cervical region: Secondary | ICD-10-CM

## 2023-01-11 DIAGNOSIS — G894 Chronic pain syndrome: Secondary | ICD-10-CM

## 2023-01-11 DIAGNOSIS — M5414 Radiculopathy, thoracic region: Secondary | ICD-10-CM

## 2023-01-11 NOTE — Telephone Encounter (Signed)
Medication refill request sent to [facility] 

## 2023-01-11 NOTE — Telephone Encounter (Signed)
Spoke with Morrie Sheldon and patient hydrocodone 10-325 need to be send in so patient can get refill pick up on 01-17-23 . Please give pharmacy a call. TY

## 2023-01-14 MED ORDER — HYDROCODONE-ACETAMINOPHEN 10-325 MG PO TABS: 1.0000 | ORAL_TABLET | Freq: Three times a day (TID) | ORAL | 0 refills | Status: DC | PRN

## 2023-01-17 ENCOUNTER — Encounter: Payer: Medicare Other | Admitting: Student in an Organized Health Care Education/Training Program

## 2023-02-05 ENCOUNTER — Encounter: Payer: Self-pay | Admitting: Student in an Organized Health Care Education/Training Program

## 2023-02-05 ENCOUNTER — Ambulatory Visit
Payer: Medicare Other | Attending: Student in an Organized Health Care Education/Training Program | Admitting: Student in an Organized Health Care Education/Training Program

## 2023-02-05 VITALS — BP 151/85 | HR 91 | Temp 97.5°F | Resp 16 | Ht 75.0 in | Wt 290.0 lb

## 2023-02-05 DIAGNOSIS — M5412 Radiculopathy, cervical region: Secondary | ICD-10-CM | POA: Diagnosis not present

## 2023-02-05 DIAGNOSIS — G894 Chronic pain syndrome: Secondary | ICD-10-CM | POA: Diagnosis present

## 2023-02-05 DIAGNOSIS — M542 Cervicalgia: Secondary | ICD-10-CM | POA: Insufficient documentation

## 2023-02-05 DIAGNOSIS — M48061 Spinal stenosis, lumbar region without neurogenic claudication: Secondary | ICD-10-CM | POA: Insufficient documentation

## 2023-02-05 DIAGNOSIS — M5481 Occipital neuralgia: Secondary | ICD-10-CM | POA: Insufficient documentation

## 2023-02-05 DIAGNOSIS — G959 Disease of spinal cord, unspecified: Secondary | ICD-10-CM | POA: Diagnosis present

## 2023-02-05 DIAGNOSIS — M5414 Radiculopathy, thoracic region: Secondary | ICD-10-CM | POA: Insufficient documentation

## 2023-02-05 MED ORDER — HYDROCODONE-ACETAMINOPHEN 10-325 MG PO TABS: 1.0000 | ORAL_TABLET | Freq: Three times a day (TID) | ORAL | 0 refills | Status: DC | PRN

## 2023-02-05 MED ORDER — DULOXETINE HCL 60 MG PO CPEP: 60.0000 mg | ORAL_CAPSULE | Freq: Every day | ORAL | 5 refills | Status: AC

## 2023-02-05 MED ORDER — PREGABALIN 100 MG PO CAPS: 100.0000 mg | ORAL_CAPSULE | Freq: Three times a day (TID) | ORAL | 5 refills | Status: DC

## 2023-02-05 MED ORDER — NORTRIPTYLINE HCL 25 MG PO CAPS: 50.0000 mg | ORAL_CAPSULE | Freq: Every day | ORAL | 5 refills | Status: DC

## 2023-02-05 NOTE — Patient Instructions (Signed)
 ______________________________________________________________________    Preparing for your procedure  Appointments: If you think you may not be able to keep your appointment, call 24-48 hours in advance to cancel. We need time to make it available to others.  During your procedure appointment there will be: No Prescription Refills. No disability issues to discussed. No medication changes or discussions.  Instructions: Food intake: Avoid eating anything solid for at least 8 hours prior to your procedure. Clear liquid intake: You may take clear liquids such as water up to 2 hours prior to your procedure. (No carbonated drinks. No soda.) Transportation: Unless otherwise stated by your physician, bring a driver. (Driver cannot be a Market researcher, Pharmacist, community, or any other form of public transportation.) Morning Medicines: Except for blood thinners, take all of your other morning medications with a sip of water. Make sure to take your heart and blood pressure medicines. If your blood pressure's lower number is above 100, the case will be rescheduled. Blood thinners: Make sure to stop your blood thinners as instructed.  If you take a blood thinner, but were not instructed to stop it, call our office [phone] and ask to talk to a nurse. Not stopping a blood thinner prior to certain procedures could lead to serious complications. Diabetics on insulin: Notify the staff so that you can be scheduled 1st case in the morning. If your diabetes requires high dose insulin, take only  of your normal insulin dose the morning of the procedure and notify the staff that you have done so. Preventing infections: Shower with an antibacterial soap the morning of your procedure.  Build-up your immune system: Take 1000 mg of Vitamin C with every meal (3 times a day) the day prior to your procedure. Antibiotics: Inform the nursing staff if you are taking any antibiotics or if you have any conditions that may require antibiotics  prior to procedures. (Example: recent joint implants)   Pregnancy: If you are pregnant make sure to notify the nursing staff. Not doing so may result in injury to the fetus, including death.  Sickness: If you have a cold, fever, or any active infections, call and cancel or reschedule your procedure. Receiving steroids while having an infection may result in complications. Arrival: You must be in the facility at least 30 minutes prior to your scheduled procedure. Tardiness: Your scheduled time is also the cutoff time. If you do not arrive at least 15 minutes prior to your procedure, you will be rescheduled.  Children: Do not bring any children with you. Make arrangements to keep them home. Dress appropriately: There is always a possibility that your clothing may get soiled. Avoid long dresses. Valuables: Do not bring any jewelry or valuables.  Reasons to call and reschedule or cancel your procedure: (Following these recommendations will minimize the risk of a serious complication.) Surgeries: Avoid having procedures within 2 weeks of any surgery. (Avoid for 2 weeks before or after any surgery). Flu Shots: Avoid having procedures within 2 weeks of a flu shots or . (Avoid for 2 weeks before or after immunizations). Barium: Avoid having a procedure within 7-10 days after having had a radiological study involving the use of radiological contrast. (Myelograms, Barium swallow or enema study). Heart attacks: Avoid any elective procedures or surgeries for the initial 6 months after a "Myocardial Infarction" (Heart Attack). Blood thinners: It is imperative that you stop these medications before procedures. Let us know if you if you take any blood thinner.  Infection: Avoid procedures during or within  two weeks of an infection (including chest colds or gastrointestinal problems). Symptoms associated with infections include: Localized redness, fever, chills, night sweats or profuse sweating, burning sensation  when voiding, cough, congestion, stuffiness, runny nose, sore throat, diarrhea, nausea, vomiting, cold or Flu symptoms, recent or current infections. It is specially important if the infection is over the area that we intend to treat. Heart and lung problems: Symptoms that may suggest an active cardiopulmonary problem include: cough, chest pain, breathing difficulties or shortness of breath, dizziness, ankle swelling, uncontrolled high or unusually low blood pressure, and/or palpitations. If you are experiencing any of these symptoms, cancel your procedure and contact your primary care physician for an evaluation.  Remember:  Regular Business hours are:  Monday to Thursday 8:00 AM to 4:00 PM  Provider's Schedule: Delano Metz, MD:  Procedure days: Tuesday and Thursday 7:30 AM to 4:00 PM  Edward Jolly, MD:  Procedure days: Monday and Wednesday 7:30 AM to 4:00 PM Last  Updated: 11/20/2022 ______________________________________________________________________

## 2023-02-05 NOTE — Progress Notes (Signed)
Nursing Pain Medication Assessment:  Safety precautions to be maintained throughout the outpatient stay will include: orient to surroundings, keep bed in low position, maintain call bell within reach at all times, provide assistance with transfer out of bed and ambulation.  Medication Inspection Compliance: Pill count conducted under aseptic conditions, in front of the patient. Neither the pills nor the bottle was removed from the patient's sight at any time. Once count was completed pills were immediately returned to the patient in their original bottle.  Medication: Hydrocodone/APAP Pill/Patch Count:  35 of 90 pills remain Pill/Patch Appearance: Markings consistent with prescribed medication Bottle Appearance: Standard pharmacy container. Clearly labeled. Filled Date: 11 / 17 / 2024 Last Medication intake:  Today

## 2023-02-05 NOTE — Progress Notes (Addendum)
PROVIDER NOTE: Information contained herein reflects review and annotations entered in association with encounter. Interpretation of such information and data should be left to medically-trained personnel. Information provided to patient can be located elsewhere in the medical record under "Patient Instructions". Document created using STT-dictation technology, any transcriptional errors that may result from process are unintentional.    Patient: Thomas Cole  Service Category: E/M  Provider: Edward Jolly, MD  DOB: 29-Dec-1970  DOS: 02/05/2023  Specialty: Interventional Pain Management  MRN: [id number]  Setting: Ambulatory outpatient  PCP: Kandyce Rud, MD  Type: Established Patient    Referring Provider: Kandyce Rud, MD  Location: Office  Delivery: Face-to-face     HPI  [name], a 52 y.o. year old male, is here today because of his Bilateral occipital neuralgia [M54.81]. Mr. Kassabian primary complain today is Neck Pain (Bilateral ) and Back Pain (Entire back )  Last encounter: My last encounter with him was on 11/26/22  Pertinent problems: Mr. Rosser has Spondylosis, cervical, with myelopathy; Migraines; History of seizures; Generalized anxiety disorder; and Bilateral occipital neuralgia on their pertinent problem list. Pain Assessment: Severity of Chronic pain is reported as a 6 /10. Location: Neck (entire back) Left, Right/sharp pain up into head from neck and into shoudler, arm and hand  on the left. Onset: More than a month ago. Quality: Discomfort, Constant, Sharp, Radiating, Dull. Timing: Constant. Modifying factor(s): medications take the edge off. Vitals:  height is 6\' 3"  (1.905 m) and weight is 290 lb (131.5 kg). His temporal temperature is 97.5 F (36.4 C) (abnormal). His blood pressure is 151/85 (abnormal) and his pulse is 91. His respiration is 16 and oxygen saturation is 98%.   Reason for encounter: medication management. As well as increase in occipital pain and left  cervical radicular pain Oziel presents today for medication management.    He is having increased occipital neuralgia.  He is status post bilateral occipital nerve block on 11/05/2022 that provided him with approximately 65 to 70% pain relief for 3 months.  Given return of pain, we discussed repeating occipital nerve block.  Future considerations include occipital nerve RFA versus stim  He is also experiencing increased cervical radicular pain, status post T1-T2 left cervical ESI on 08/20/2022.  This provided him with approximately 75% pain relief for 5 months.  Given return of pain, we discussed repeating.  Otherwise we will refill his chronic pain medications as below.   Pharmacotherapy Assessment  Analgesic:  hydrocodone immediate release 10 mg 3 times daily as needed for breakthrough pain    Monitoring:, hydrocodone immediate release 10 mg 3 times daily as needed for breakthrough pain   [place] PMP: PDMP reviewed during this encounter.       Pharmacotherapy: No side-effects or adverse reactions reported. Compliance: No problems identified. Effectiveness: Clinically acceptable.  UDS:  Summary  Date Value Ref Range Status  10/17/2021 Note  Final    Comment:    ==================================================================== Compliance Drug Analysis, Ur ==================================================================== Test                             Result       Flag       Units  Drug Present and Declared for Prescription Verification   7-aminoclonazepam              96           EXPECTED   ng/mg creat    7-aminoclonazepam  is an expected metabolite of clonazepam. Source of    clonazepam is a scheduled prescription medication.    Hydrocodone                    758          EXPECTED   ng/mg creat   Hydromorphone                  87           EXPECTED   ng/mg creat   Dihydrocodeine                 263          EXPECTED   ng/mg creat   Norhydrocodone                 1508          EXPECTED   ng/mg creat    Sources of hydrocodone include scheduled prescription medications.    Hydromorphone, dihydrocodeine and norhydrocodone are expected    metabolites of hydrocodone. Hydromorphone and dihydrocodeine are    also available as scheduled prescription medications.    Pregabalin                     PRESENT      EXPECTED   Tizanidine                     PRESENT      EXPECTED   Duloxetine                     PRESENT      EXPECTED   Nortriptyline                  PRESENT      EXPECTED    Nortriptyline may be administered as a prescription drug; it is also    an expected metabolite of amitriptyline.    Acetaminophen                  PRESENT      EXPECTED  Drug Absent but Declared for Prescription Verification   Phenytoin                      Not Detected UNEXPECTED   Metoprolol                     Not Detected UNEXPECTED ==================================================================== Test                      Result    Flag   Units      Ref Range   Creatinine              76               mg/dL      >=10 ==================================================================== Declared Medications:  The flagging and interpretation on this report are based on the  following declared medications.  Unexpected results may arise from  inaccuracies in the declared medications.   **Note: The testing scope of this panel includes these medications:   Clonazepam (Klonopin)  Duloxetine (Cymbalta)  Hydrocodone  Hydrocodone (Norco)  Metoprolol (Toprol)  Nortriptyline (Pamelor)  Phenytoin (Dilantin)  Pregabalin (Lyrica)   **Note: The testing scope of this panel does not include small to  moderate amounts of these reported medications:   Acetaminophen (Norco)  Tizanidine (Zanaflex)   **Note:  The testing scope of this panel does not include the  following reported medications:   Carbidopa (Sinemet)  Cyanocobalamin  Furosemide (Lasix)  Glipizide  Hydrochlorothiazide  (Hydrodiuril)  Levodopa (Sinemet)  Losartan (Cozaar)  Metformin (Glucophage)  Omeprazole (Prilosec)  Ubrogepant Bernita Raisin)  Vitamin D3 ==================================================================== For clinical consultation, please call [phone]. ====================================================================       ROS  Constitutional: Denies any fever or chills Gastrointestinal: No reported hemesis, hematochezia, vomiting, or acute GI distress Musculoskeletal: Occipital pain, left cervical radicular pain Neurological: No reported episodes of acute onset apraxia, aphasia, dysarthria, agnosia, amnesia, paralysis, loss of coordination, or loss of consciousness  Medication Review  DULoxetine, HYDROcodone-acetaminophen, Vitamin D3, carbidopa-levodopa, clonazePAM, cyanocobalamin, furosemide, hydrochlorothiazide, lamoTRIgine, losartan, metFORMIN, metoprolol succinate, nortriptyline, omeprazole, phenytoin, pregabalin, and sildenafil  History Review  Allergy: Mr. Junious is allergic to amitriptyline, bee venom, chlorhexidine, carbamazepine, meloxicam, morphine and codeine, and sulfa antibiotics. Drug: Mr. Natzke  reports no history of drug use. Alcohol:  reports no history of alcohol use. Tobacco:  reports that he has never smoked. He has never used smokeless tobacco. Social: Mr. Kromer  reports that he has never smoked. He has never used smokeless tobacco. He reports that he does not drink alcohol and does not use drugs. Medical:  has a past medical history of Anxiety, Asthma, Back problem, Benign essential hypertension (11/30/2016), Cervical spondylosis without myelopathy (09/17/2012), Complication of anesthesia, Depression, Diabetes mellitus without complication (HCC) (02/2020), GERD (gastroesophageal reflux disease), Hypertension, Labile hypertension (03/19/2013), Major depressive disorder, recurrent episode, severe (HCC) (06/23/2015), Migraine headache, Migraines (11/11/2013),  S/P appendectomy (04/09/2011), Seizure (HCC) (11/30/2016), Seizures (HCC), Shortness of breath, and SOB (shortness of breath) (08/06/2014). Surgical: Mr. Neeson  has a past surgical history that includes Knee surgery; Posterior fusion cervical spine; Cervical disc surgery; Appendectomy (12); Anterior cervical corpectomy (N/A, 09/15/2012); Cholecystectomy (N/A, 03/29/2017); Colonoscopy with propofol (N/A, 10/23/2019); and Esophagogastroduodenoscopy (egd) with propofol (N/A, 10/23/2019). Family: family history includes Arthritis in his father, maternal grandfather, maternal grandmother, mother, paternal grandfather, and paternal grandmother; Asthma in his mother; Coronary artery disease in his father and mother; Diabetes in his father, paternal grandfather, and paternal grandmother; Heart disease in his father, maternal grandfather, maternal grandmother, paternal grandfather, and paternal grandmother; Hypertension in his maternal grandfather, maternal grandmother, mother, paternal grandfather, and paternal grandmother; Mental illness in his mother.  Laboratory Chemistry Profile   Renal Lab Results  Component Value Date   BUN 13 04/21/2017   CREATININE 0.93 04/21/2017   LABCREA 79.3 03/05/2014   GFR 82.19 08/05/2014   GFRAA >60 04/21/2017   GFRNONAA >60 04/21/2017     Hepatic Lab Results  Component Value Date   AST 30 04/21/2017   ALT 23 04/21/2017   ALBUMIN 4.2 04/21/2017   ALKPHOS 86 04/21/2017   LIPASE 51 04/21/2017     Electrolytes Lab Results  Component Value Date   NA 138 04/21/2017   K 3.7 04/21/2017   CL 101 04/21/2017   CALCIUM 9.4 04/21/2017   MG 1.9 04/26/2020     Bone Lab Results  Component Value Date   VD25OH 31.95 03/05/2016   25OHVITD1 35 04/26/2020   25OHVITD2 <1.0 04/26/2020   25OHVITD3 35 04/26/2020     Inflammation (CRP: Acute Phase) (ESR: Chronic Phase) Lab Results  Component Value Date   CRP 10 04/26/2020   ESRSEDRATE 22 04/26/2020       Note: Above Lab  results reviewed.   Physical Exam  General appearance: Well nourished, well developed, and well hydrated. In no apparent acute distress  Mental status: Alert, oriented x 3 (person, place, & time)       Respiratory: No evidence of acute respiratory distress Eyes: PERLA Vitals: BP (!) 151/85 (BP Location: Right Arm, Patient Position: Sitting, Cuff Size: Large)   Pulse 91   Temp (!) 97.5 F (36.4 C) (Temporal)   Resp 16   Ht 6\' 3"  (1.905 m)   Wt 290 lb (131.5 kg)   SpO2 98%   BMI 36.25 kg/m  BMI: Estimated body mass index is 36.25 kg/m as calculated from the following:   Height as of this encounter: 6\' 3"  (1.905 m).   Weight as of this encounter: 290 lb (131.5 kg). Ideal: Ideal body weight: 84.5 kg (186 lb 4.6 oz) Adjusted ideal body weight: 103.3 kg (227 lb 12.4 oz)  Cervical Spine Area Exam  Skin & Axial Inspection: Well healed scar from previous spine surgery detected Alignment: Symmetrical Functional ROM: Pain restricted ROM, bilaterally Stability: No instability detected Muscle Tone/Strength: Functionally intact. No obvious neuro-muscular anomalies detected. Sensory (Neurological): neurogenic, left dermatomal Palpation: No palpable anomalies               Upper Extremity (UE) Exam      Side: Right upper extremity   Side: Left upper extremity  Skin & Extremity Inspection: Skin color, temperature, and hair growth are WNL. No peripheral edema or cyanosis. No masses, redness, swelling, asymmetry, or associated skin lesions. No contractures.   Skin & Extremity Inspection: Skin color, temperature, and hair growth are WNL. No peripheral edema or cyanosis. No masses, redness, swelling, asymmetry, or associated skin lesions. No contractures.  Functional ROM: Pain restricted ROM for shoulder and elbow   Functional ROM: Pain restricted ROM for shoulder and elbow  Muscle Tone/Strength: Functionally intact. No obvious neuro-muscular anomalies detected.   Muscle Tone/Strength: Functionally  intact. No obvious neuro-muscular anomalies detected.  Sensory (Neurological): Unimpaired           Sensory (Neurological): Unimpaired          Palpation: No palpable anomalies               Palpation: No palpable anomalies              Provocative Test(s):  Phalen's test: deferred Tinel's test: deferred Apley's scratch test (touch opposite shoulder):  Action 1 (Across chest): Decreased ROM Action 2 (Overhead): Decreased ROM Action 3 (LB reach): Decreased ROM       Provocative Test(s):  Phalen's test: deferred Tinel's test: deferred Apley's scratch test (touch opposite shoulder):  Action 1 (Across chest): Decreased ROM Action 2 (Overhead): Decreased ROM Action 3 (LB reach): Decreased ROM     Lumbar Spine Area Exam  Skin & Axial Inspection: No masses, redness, or swelling Alignment: Symmetrical Functional ROM: Pain restricted ROM Stability: No instability detected Muscle Tone/Strength: Functionally intact. No obvious neuro-muscular anomalies detected. Sensory (Neurological): Dermatomal pain pattern left greater than right; improved after LESI   Gait & Posture Assessment  Ambulation: Patient came in today in a wheel chair Gait: Antalgic gait (limping) Posture: Difficulty standing up straight, due to pain   Lower Extremity Exam    Side: Right lower extremity  Side: Left lower extremity  Stability: No instability observed          Stability: No instability observed          Skin & Extremity Inspection: Skin color, temperature, and hair growth are WNL. No peripheral edema or cyanosis. No masses, redness, swelling, asymmetry, or associated skin lesions. No  contractures.  Skin & Extremity Inspection: Skin color, temperature, and hair growth are WNL. No peripheral edema or cyanosis. No masses, redness, swelling, asymmetry, or associated skin lesions. No contractures.  Functional ROM: Unrestricted ROM                  Functional ROM: Pain restricted ROM for hip and knee joints,    Muscle Tone/Strength: Functionally intact. No obvious neuro-muscular anomalies detected.  Muscle Tone/Strength: Functionally intact. No obvious neuro-muscular anomalies detected.  Sensory (Neurological): Unimpaired        Sensory (Neurological): Dermatomal pain pattern        DTR: Patellar: deferred today Achilles: deferred today Plantar: deferred today  DTR: Patellar: deferred today Achilles: deferred today Plantar: deferred today  Palpation: No palpable anomalies  Palpation: No palpable anomalies     Assessment   Status Diagnosis  Having a Flare-up Having a Flare-up Having a Flare-up 1. Bilateral occipital neuralgia   2. Cervicalgia   3. Cervical radicular pain   4. Neuroforaminal stenosis of lumbar spine (L3,4,5)   5. Thoracic radiculopathy   6. Chronic pain syndrome   7. Myelopathy of cervical spinal cord with cervical radiculopathy (HCC)         Plan of Care  Mr. BERNARDINO DOWELL has a current medication list which includes the following long-term medication(s): carbidopa-levodopa, carbidopa-levodopa, clonazepam, lamotrigine, metformin, metoprolol succinate, sildenafil, duloxetine, hydrocodone-acetaminophen, [START ON 02/14/2023] hydrocodone-acetaminophen, [START ON 03/16/2023] hydrocodone-acetaminophen, [START ON 04/15/2023] hydrocodone-acetaminophen, nortriptyline, and pregabalin.  Pharmacotherapy (Medications Ordered): Meds ordered this encounter  Medications   HYDROcodone-acetaminophen (NORCO) 10-325 MG tablet    Sig: Take 1 tablet by mouth every 8 (eight) hours as needed. For chronic pain syndrome. Each Rx to last 30 days.    Dispense:  90 tablet    Refill:  0   HYDROcodone-acetaminophen (NORCO) 10-325 MG tablet    Sig: Take 1 tablet by mouth every 8 (eight) hours as needed. For chronic pain syndrome. Each Rx to last 30 days.    Dispense:  90 tablet    Refill:  0   HYDROcodone-acetaminophen (NORCO) 10-325 MG tablet    Sig: Take 1 tablet by mouth every 8 (eight)  hours as needed. For chronic pain syndrome. Each Rx to last 30 days.    Dispense:  90 tablet    Refill:  0   DULoxetine (CYMBALTA) 60 MG capsule    Sig: Take 1 capsule (60 mg total) by mouth daily after breakfast.    Dispense:  30 capsule    Refill:  5   nortriptyline (PAMELOR) 25 MG capsule    Sig: Take 2 capsules (50 mg total) by mouth at bedtime.    Dispense:  60 capsule    Refill:  5   pregabalin (LYRICA) 100 MG capsule    Sig: Take 1 capsule (100 mg total) by mouth 3 (three) times daily.    Dispense:  90 capsule    Refill:  5    Do not place this medication, or any other prescription from our practice, on "Automatic Refill". Patient may have prescription filled one day early if pharmacy is closed on scheduled refill date.     Orders Placed This Encounter  Procedures   GREATER OCCIPITAL NERVE BLOCK    Standing Status:   Future    Standing Expiration Date:   05/08/2023    Scheduling Instructions:     Procedure: Occipital nerve block     Laterality: Bilateral     Sedation: Patient's choice.  Timeframe: ASAA    Order Specific Question:   Where will this procedure be performed?    Answer:   [facility]   Cervical Epidural Injection    Sedation: Patient's choice. Purpose: Diagnostic/Therapeutic Indication(s): Radiculitis and cervicalgia associater with cervical degenerative disc disease.    Standing Status:   Future    Standing Expiration Date:   05/08/2023    Scheduling Instructions:     Procedure: Cervical Epidural Steroid Injection/Block     Level(s): T1-2     Laterality: LEFT     Timeframe: As soon as schedule allows    Order Specific Question:   Where will this procedure be performed?    Answer:   [facility]    Comments:   Janaki Exley     Follow-up plan:   Return in about 15 days (around 02/20/2023) for B/L Occipital and T1/T2 ESI, in clinic IV Versed (UDS as well).    Recent Visits Date Type Provider Dept  11/26/22 Office Visit Edward Jolly, MD  [facility]  Showing recent visits within past 90 days and meeting all other requirements Today's Visits Date Type Provider Dept  02/05/23 Office Visit Edward Jolly, MD [facility]  Showing today's visits and meeting all other requirements Future Appointments No visits were found meeting these conditions. Showing future appointments within next 90 days and meeting all other requirements  I discussed the assessment and treatment plan with the patient. The patient was provided an opportunity to ask questions and all were answered. The patient agreed with the plan and demonstrated an understanding of the instructions.  Patient advised to call back or seek an in-person evaluation if the symptoms or condition worsens.  Duration of encounter: 30 minutes.  Note by: Edward Jolly, MD Date: 02/05/2023; Time: 1:12 PM

## 2023-02-11 ENCOUNTER — Other Ambulatory Visit: Payer: Self-pay | Admitting: Student in an Organized Health Care Education/Training Program

## 2023-02-13 ENCOUNTER — Ambulatory Visit
Admission: RE | Admit: 2023-02-13 | Discharge: 2023-02-13 | Disposition: A | Discharge status: Home / Self Care | Payer: Medicare Other | Source: Ambulatory Visit | Attending: Student in an Organized Health Care Education/Training Program | Admitting: Student in an Organized Health Care Education/Training Program

## 2023-02-13 ENCOUNTER — Encounter: Payer: Self-pay | Admitting: Student in an Organized Health Care Education/Training Program

## 2023-02-13 ENCOUNTER — Ambulatory Visit
Payer: Medicare Other | Attending: Student in an Organized Health Care Education/Training Program | Admitting: Student in an Organized Health Care Education/Training Program

## 2023-02-13 VITALS — BP 136/90 | HR 93 | Temp 97.2°F | Resp 16 | Ht 75.0 in | Wt 290.0 lb

## 2023-02-13 DIAGNOSIS — M5481 Occipital neuralgia: Secondary | ICD-10-CM | POA: Diagnosis not present

## 2023-02-13 DIAGNOSIS — M542 Cervicalgia: Secondary | ICD-10-CM | POA: Diagnosis present

## 2023-02-13 DIAGNOSIS — M5414 Radiculopathy, thoracic region: Secondary | ICD-10-CM | POA: Insufficient documentation

## 2023-02-13 DIAGNOSIS — G894 Chronic pain syndrome: Secondary | ICD-10-CM | POA: Insufficient documentation

## 2023-02-13 MED ORDER — DEXAMETHASONE SODIUM PHOSPHATE 10 MG/ML IJ SOLN
10.0000 mg | Freq: Once | INTRAMUSCULAR | Status: AC
  Administered 2023-02-13: 10 mg
  Filled 2023-02-13: qty 1

## 2023-02-13 MED ORDER — ROPIVACAINE HCL 2 MG/ML IJ SOLN
9.0000 mL | Freq: Once | INTRAMUSCULAR | Status: AC
  Administered 2023-02-13: 9 mL via PERINEURAL
  Filled 2023-02-13: qty 20

## 2023-02-13 MED ORDER — MIDAZOLAM HCL 2 MG/2ML IJ SOLN
0.5000 mg | Freq: Once | INTRAMUSCULAR | Status: AC
  Administered 2023-02-13: 2 mg via INTRAVENOUS
  Filled 2023-02-13: qty 2

## 2023-02-13 MED ORDER — SODIUM CHLORIDE (PF) 0.9 % IJ SOLN
INTRAMUSCULAR | Status: AC
Start: 2023-02-13 — End: ?
  Filled 2023-02-13: qty 10

## 2023-02-13 MED ORDER — ROPIVACAINE HCL 2 MG/ML IJ SOLN
2.0000 mL | Freq: Once | INTRAMUSCULAR | Status: AC
  Administered 2023-02-13: 2 mL via EPIDURAL
  Filled 2023-02-13: qty 20

## 2023-02-13 MED ORDER — LIDOCAINE HCL 2 % IJ SOLN
20.0000 mL | Freq: Once | INTRAMUSCULAR | Status: AC
  Administered 2023-02-13: 400 mg
  Filled 2023-02-13: qty 20

## 2023-02-13 MED ORDER — IOHEXOL 180 MG/ML  SOLN
10.0000 mL | Freq: Once | INTRAMUSCULAR | Status: AC
  Administered 2023-02-13: 10 mL via EPIDURAL
  Filled 2023-02-13: qty 20

## 2023-02-13 MED ORDER — SODIUM CHLORIDE 0.9% FLUSH
2.0000 mL | Freq: Once | INTRAVENOUS | Status: AC
  Administered 2023-02-13: 2 mL

## 2023-02-13 MED ORDER — LACTATED RINGERS IV SOLN: Freq: Once | INTRAVENOUS | Status: AC

## 2023-02-13 NOTE — Patient Instructions (Signed)

## 2023-02-13 NOTE — Progress Notes (Signed)
PROVIDER NOTE: Interpretation of information contained herein should be left to medically-trained personnel. Specific patient instructions are provided elsewhere under "Patient Instructions" section of medical record. This document was created in part using STT-dictation technology, any transcriptional errors that may result from this process are unintentional.  Patient: Thomas Cole Type: Established DOB: 03/31/1971 MRN: [id number] PCP: Kandyce Rud, MD  Service: Procedure DOS: 02/13/2023 Setting: Ambulatory Location: Ambulatory outpatient facility Delivery: Face-to-face Provider: Edward Jolly, MD Specialty: Interventional Pain Management Specialty designation: 09 Location: Outpatient facility Ref. Prov.: Edward Jolly, MD       Interventional Therapy   Primary Reason for Visit: Interventional Pain Management Treatment. CC: headaches    Procedure:          Anesthesia, Analgesia, Anxiolysis:  Type: Therapeutic, Greater, Occipital Nerve Block  #2  Region: Posterolateral Cervical Level: Occipital Ridge   Laterality: Bilateral  Anesthesia: Local (1-2% Lidocaine)  Anxiolysis: None  Sedation: Minimal     Position: Prone   1. Bilateral occipital neuralgia   2. Thoracic radiculopathy   3. Chronic pain syndrome   4. Cervicalgia    NAS-11 Pain score:   Pre-procedure: 7 /10   Post-procedure: 0-No pain/10     H&P (Pre-op Assessment):  Mr. Meek is a 52 y.o. (year old), male patient, seen today for interventional treatment. He  has a past surgical history that includes Knee surgery; Posterior fusion cervical spine; Cervical disc surgery; Appendectomy (12); Anterior cervical corpectomy (N/A, 09/15/2012); Cholecystectomy (N/A, 03/29/2017); Colonoscopy with propofol (N/A, 10/23/2019); and Esophagogastroduodenoscopy (egd) with propofol (N/A, 10/23/2019). Mr. Edward has a current medication list which includes the following prescription(s): carbidopa-levodopa, carbidopa-levodopa, vitamin  d3, clonazepam, duloxetine, furosemide, hydrochlorothiazide, [START ON 02/14/2023] hydrocodone-acetaminophen, [START ON 03/16/2023] hydrocodone-acetaminophen, [START ON 04/15/2023] hydrocodone-acetaminophen, lamotrigine, losartan, metformin, metoprolol succinate, nortriptyline, omeprazole, phenytoin, pregabalin, sildenafil, cyanocobalamin, and hydrocodone-acetaminophen. His primarily concern today is the Neck Pain (Bilateral radiating into the head ) and Back Pain (Thoracic midline to the left )  Initial Vital Signs:  Pulse/HCG Rate: 93ECG Heart Rate: 85 Temp: (!) 97.2 F (36.2 C) Resp: 16 BP: 128/89 SpO2: 99 %  BMI: Estimated body mass index is 36.25 kg/m as calculated from the following:   Height as of this encounter: 6\' 3"  (1.905 m).   Weight as of this encounter: 290 lb (131.5 kg).  Risk Assessment: Allergies: Reviewed. He is allergic to amitriptyline, bee venom, chlorhexidine, carbamazepine, meloxicam, morphine and codeine, and sulfa antibiotics.  Allergy Precautions: None required Coagulopathies: Reviewed. None identified.  Blood-thinner therapy: None at this time Active Infection(s): Reviewed. None identified. Mr. Relyea is afebrile  Site Confirmation: Mr. Beckum was asked to confirm the procedure and laterality before marking the site Procedure checklist: Completed Consent: Before the procedure and under the influence of no sedative(s), amnesic(s), or anxiolytics, the patient was informed of the treatment options, risks and possible complications. To fulfill our ethical and legal obligations, as recommended by the American Medical Association's Code of Ethics, I have informed the patient of my clinical impression; the nature and purpose of the treatment or procedure; the risks, benefits, and possible complications of the intervention; the alternatives, including doing nothing; the risk(s) and benefit(s) of the alternative treatment(s) or procedure(s); and the risk(s) and benefit(s) of  doing nothing. The patient was provided information about the general risks and possible complications associated with the procedure. These may include, but are not limited to: failure to achieve desired goals, infection, bleeding, organ or nerve damage, allergic reactions, paralysis, and death. In addition, the patient was  informed of those risks and complications associated to the procedure, such as failure to decrease pain; infection; bleeding; organ or nerve damage with subsequent damage to sensory, motor, and/or autonomic systems, resulting in permanent pain, numbness, and/or weakness of one or several areas of the body; allergic reactions; (i.e.: anaphylactic reaction); and/or death. Furthermore, the patient was informed of those risks and complications associated with the medications. These include, but are not limited to: allergic reactions (i.e.: anaphylactic or anaphylactoid reaction(s)); adrenal axis suppression; blood sugar elevation that in diabetics may result in ketoacidosis or comma; water retention that in patients with history of congestive heart failure may result in shortness of breath, pulmonary edema, and decompensation with resultant heart failure; weight gain; swelling or edema; medication-induced neural toxicity; particulate matter embolism and blood vessel occlusion with resultant organ, and/or nervous system infarction; and/or aseptic necrosis of one or more joints. Finally, the patient was informed that Medicine is not an exact science; therefore, there is also the possibility of unforeseen or unpredictable risks and/or possible complications that may result in a catastrophic outcome. The patient indicated having understood very clearly. We have given the patient no guarantees and we have made no promises. Enough time was given to the patient to ask questions, all of which were answered to the patient's satisfaction. Mr. Gilcrest has indicated that he wanted to continue with the  procedure. Attestation: I, the ordering provider, attest that I have discussed with the patient the benefits, risks, side-effects, alternatives, likelihood of achieving goals, and potential problems during recovery for the procedure that I have provided informed consent. Date  Time: 02/13/2023  8:55 AM  Pre-Procedure Preparation:  Monitoring: As per clinic protocol. Respiration, ETCO2, SpO2, BP, heart rate and rhythm monitor placed and checked for adequate function Safety Precautions: Patient was assessed for positional comfort and pressure points before starting the procedure. Time-out: I initiated and conducted the "Time-out" before starting the procedure, as per protocol. The patient was asked to participate by confirming the accuracy of the "Time Out" information. Verification of the correct person, site, and procedure were performed and confirmed by me, the nursing staff, and the patient. "Time-out" conducted as per Joint Commission's Universal Protocol (UP.01.01.01). Time: 0946 Start Time: 0950 hrs.  Description of Procedure:          Target Area: Area medial to the occipital artery at the level of the superior nuchal ridge Approach: Posterior approach Area Prepped: Entire Posterior Occipital Region DuraPrep (Iodine Povacrylex [0.7% available iodine] and Isopropyl Alcohol, 74% w/w) Safety Precautions: Aspiration looking for blood return was conducted prior to all injections. At no point did we inject any substances, as a needle was being advanced. No attempts were made at seeking any paresthesias. Safe injection practices and needle disposal techniques used. Medications properly checked for expiration dates. SDV (single dose vial) medications used. Description of the Procedure: Protocol guidelines were followed. The target area was identified and the area prepped in the usual manner. Skin & deeper tissues infiltrated with local anesthetic. Appropriate amount of time allowed to pass for local  anesthetics to take effect. The procedure needles were then advanced to the target area. Proper needle placement secured. Negative aspiration confirmed. Solution injected in intermittent fashion, asking for systemic symptoms every 0.5cc of injectate. The needles were then removed and the area cleansed, making sure to leave some of the prepping solution back to take advantage of its long term bactericidal properties.  Vitals:   02/13/23 0945 02/13/23 0950 02/13/23 0953 02/13/23 1000  BP: (!) 131/93 (!) 131/95 (!) 130/93 (!) 136/90  Pulse:      Resp: 10 13 15 16   Temp:      TempSrc:      SpO2: 95% 95% 95% 96%  Weight:      Height:        Start Time: 0950 hrs. End Time: 0953 hrs. Materials:  Needle(s) Type: Spinal Needle Gauge: 22G Length: 1.5-in Medication(s): Please see orders for medications and dosing details.  10 cc solution made of 9 cc of 0.2% ropivacaine, 1 cc of Decadron 10 mg/cc. 5 cc injected for left and right GON    Antibiotic Prophylaxis:   Anti-infectives (From admission, onward)    None      Indication(s): None identified  Post-operative Assessment:  Post-procedure Vital Signs:  Pulse/HCG Rate: 9386 Temp: (!) 97.2 F (36.2 C) Resp: 16 BP: (!) 136/90 SpO2: 96 %  EBL: None  Complications: No immediate post-treatment complications observed by team, or reported by patient.  Note: The patient tolerated the entire procedure well. A repeat set of vitals were taken after the procedure and the patient was kept under observation following institutional policy, for this type of procedure. Post-procedural neurological assessment was performed, showing return to baseline, prior to discharge. The patient was provided with post-procedure discharge instructions, including a section on how to identify potential problems. Should any problems arise concerning this procedure, the patient was given instructions to immediately contact us, at any time, without hesitation. In  any case, we plan to contact the patient by telephone for a follow-up status report regarding this interventional procedure.  Comments:  No additional relevant information.  Plan of Care (POC)  Orders:  Orders Placed This Encounter  Procedures   DG PAIN CLINIC C-ARM 1-60 MIN NO REPORT    Intraoperative interpretation by procedural physician at [facility].    Standing Status:   Standing    Number of Occurrences:   1    Order Specific Question:   Reason for exam:    Answer:   Assistance in needle guidance and placement for procedures requiring needle placement in or near specific anatomical locations not easily accessible without such assistance.    Medications ordered for procedure: Meds ordered this encounter  Medications   iohexol (OMNIPAQUE) 180 MG/ML injection 10 mL    Must be Myelogram-compatible. If not available, you may substitute with a water-soluble, non-ionic, hypoallergenic, myelogram-compatible radiological contrast medium.   lidocaine (XYLOCAINE) 2 % (with pres) injection 400 mg   lactated ringers infusion   midazolam (VERSED) injection 0.5-2 mg    Make sure Flumazenil is available in the pyxis when using this medication. If oversedation occurs, administer 0.2 mg IV over 15 sec. If after 45 sec no response, administer 0.2 mg again over 1 min; may repeat at 1 min intervals; not to exceed 4 doses (1 mg)   ropivacaine (PF) 2 mg/mL (0.2%) (NAROPIN) injection 2 mL   sodium chloride flush (NS) 0.9 % injection 2 mL   dexamethasone (DECADRON) injection 10 mg   dexamethasone (DECADRON) injection 10 mg   ropivacaine (PF) 2 mg/mL (0.2%) (NAROPIN) injection 9 mL   Medications administered: We administered iohexol, lidocaine, lactated ringers, midazolam, ropivacaine (PF) 2 mg/mL (0.2%), sodium chloride flush, dexamethasone, dexamethasone, and ropivacaine (PF) 2 mg/mL (0.2%).  See the medical record for exact dosing, route, and time of administration.  Follow-up plan:    Return for Keep appt as scheduled .      Recent Visits Date  Type Provider Dept  02/05/23 Office Visit Edward Jolly, MD [facility]  11/26/22 Office Visit Edward Jolly, MD [facility]  Showing recent visits within past 90 days and meeting all other requirements Today's Visits Date Type Provider Dept  02/13/23 Procedure visit Edward Jolly, MD [facility]  Showing today's visits and meeting all other requirements Future Appointments Date Type Provider Dept  05/09/23 Appointment Edward Jolly, MD [facility]  Showing future appointments within next 90 days and meeting all other requirements  Disposition: Discharge home  Discharge (Date  Time): 02/13/2023; 1001 hrs.   Primary Care Physician: Kandyce Rud, MD Location: [facility] Note by: Edward Jolly, MD (TTS technology used. I apologize for any typographical errors that were not detected and corrected.) Date: 02/13/2023; Time: 10:56 AM  Disclaimer:  Medicine is not an Visual merchandiser. The only guarantee in medicine is that nothing is guaranteed. It is important to note that the decision to proceed with this intervention was based on the information collected from the patient. The Data and conclusions were drawn from the patient's questionnaire, the interview, and the physical examination. Because the information was provided in large part by the patient, it cannot be guaranteed that it has not been purposely or unconsciously manipulated. Every effort has been made to obtain as much relevant data as possible for this evaluation. It is important to note that the conclusions that lead to this procedure are derived in large part from the available data. Always take into account that the treatment will also be dependent on availability of resources and existing treatment guidelines, considered by other Pain Management Practitioners as being common knowledge and practice, at  the time of the intervention. For Medico-Legal purposes, it is also important to point out that variation in procedural techniques and pharmacological choices are the acceptable norm. The indications, contraindications, technique, and results of the above procedure should only be interpreted and judged by a Board-Certified Interventional Pain Specialist with extensive familiarity and expertise in the same exact procedure and technique.

## 2023-02-13 NOTE — Progress Notes (Signed)
PROVIDER NOTE: Interpretation of information contained herein should be left to medically-trained personnel. Specific patient instructions are provided elsewhere under "Patient Instructions" section of medical record. This document was created in part using STT-dictation technology, any transcriptional errors that may result from this process are unintentional.  Patient: Thomas Cole Type: Established DOB: 11-21-1970 MRN: [id number] PCP: Kandyce Rud, MD  Service: Procedure DOS: 02/13/2023 Setting: Ambulatory Location: Ambulatory outpatient facility Delivery: Face-to-face Provider: Edward Jolly, MD Specialty: Interventional Pain Management Specialty designation: 09 Location: Outpatient facility Ref. Prov.: Edward Jolly, MD       Interventional Therapy   Procedure: Thoracic  Epidural Steroid injection (TESI) (Interlaminar) #2  Laterality: Midline  Level: T1-2 Imaging: Fluoroscopy-assisted DOS: 02/13/2023  Performed by: Edward Jolly, MD Anesthesia: Local anesthesia (1-2% Lidocaine) Anxiolysis:IV Versed 2 mg  Purpose: Diagnostic/Therapeutic Indications: Cervicalgia, thoracic radicular pain, degenerative disc disease, severe enough to impact quality of life or function. 1. Bilateral occipital neuralgia   2. Thoracic radiculopathy   3. Chronic pain syndrome   4. Cervicalgia    NAS-11 score:   Pre-procedure: 7 /10   Post-procedure: 0-No pain/10      Position  Prep  Materials:  Location setting: Procedure suite Position: Prone, on modified reverse trendelenburg to facilitate breathing, with head in head-cradle. Pillows positioned under chest (below chin-level) with cervical spine flexed. Safety Precautions: Patient was assessed for positional comfort and pressure points before starting the procedure. Prepping solution: DuraPrep (Iodine Povacrylex [0.7% available iodine] and Isopropyl Alcohol, 74% w/w) Prep Area: Entire  cervicothoracic region Approach: percutaneous,  paramedial Intended target: Posterior cervical epidural space Materials Procedure:  Tray: Epidural Needle(s): Epidural (Tuohy) Qty: 1 Length: (90mm) 3.5-inch Gauge: 22G   Pre-op H&P Assessment:  Thomas Cole is a 52 y.o. (year old), male patient, seen today for interventional treatment. He  has a past surgical history that includes Knee surgery; Posterior fusion cervical spine; Cervical disc surgery; Appendectomy (12); Anterior cervical corpectomy (N/A, 09/15/2012); Cholecystectomy (N/A, 03/29/2017); Colonoscopy with propofol (N/A, 10/23/2019); and Esophagogastroduodenoscopy (egd) with propofol (N/A, 10/23/2019). Thomas Cole has a current medication list which includes the following prescription(s): carbidopa-levodopa, carbidopa-levodopa, vitamin d3, clonazepam, duloxetine, furosemide, hydrochlorothiazide, [START ON 02/14/2023] hydrocodone-acetaminophen, [START ON 03/16/2023] hydrocodone-acetaminophen, [START ON 04/15/2023] hydrocodone-acetaminophen, lamotrigine, losartan, metformin, metoprolol succinate, nortriptyline, omeprazole, phenytoin, pregabalin, sildenafil, cyanocobalamin, and hydrocodone-acetaminophen. His primarily concern today is the Neck Pain (Bilateral radiating into the head ) and Back Pain (Thoracic midline to the left )  Initial Vital Signs:  Pulse/HCG Rate: 93ECG Heart Rate: 85 Temp: (!) 97.2 F (36.2 C) Resp: 16 BP: 128/89 SpO2: 99 %  BMI: Estimated body mass index is 36.25 kg/m as calculated from the following:   Height as of this encounter: 6\' 3"  (1.905 m).   Weight as of this encounter: 290 lb (131.5 kg).  Risk Assessment: Allergies: Reviewed. He is allergic to amitriptyline, bee venom, chlorhexidine, carbamazepine, meloxicam, morphine and codeine, and sulfa antibiotics.  Allergy Precautions: None required Coagulopathies: Reviewed. None identified.  Blood-thinner therapy: None at this time Active Infection(s): Reviewed. None identified. Thomas Cole is afebrile  Site  Confirmation: Thomas Cole was asked to confirm the procedure and laterality before marking the site Procedure checklist: Completed Consent: Before the procedure and under the influence of no sedative(s), amnesic(s), or anxiolytics, the patient was informed of the treatment options, risks and possible complications. To fulfill our ethical and legal obligations, as recommended by the American Medical Association's Code of Ethics, I have informed the patient of my clinical impression; the nature and purpose  of the treatment or procedure; the risks, benefits, and possible complications of the intervention; the alternatives, including doing nothing; the risk(s) and benefit(s) of the alternative treatment(s) or procedure(s); and the risk(s) and benefit(s) of doing nothing. The patient was provided information about the general risks and possible complications associated with the procedure. These may include, but are not limited to: failure to achieve desired goals, infection, bleeding, organ or nerve damage, allergic reactions, paralysis, and death. In addition, the patient was informed of those risks and complications associated to Spine-related procedures, such as failure to decrease pain; infection (i.e.: Meningitis, epidural or intraspinal abscess); bleeding (i.e.: epidural hematoma, subarachnoid hemorrhage, or any other type of intraspinal or peri-dural bleeding); organ or nerve damage (i.e.: Any type of peripheral nerve, nerve root, or spinal cord injury) with subsequent damage to sensory, motor, and/or autonomic systems, resulting in permanent pain, numbness, and/or weakness of one or several areas of the body; allergic reactions; (i.e.: anaphylactic reaction); and/or death. Furthermore, the patient was informed of those risks and complications associated with the medications. These include, but are not limited to: allergic reactions (i.e.: anaphylactic or anaphylactoid reaction(s)); adrenal axis suppression;  blood sugar elevation that in diabetics may result in ketoacidosis or comma; water retention that in patients with history of congestive heart failure may result in shortness of breath, pulmonary edema, and decompensation with resultant heart failure; weight gain; swelling or edema; medication-induced neural toxicity; particulate matter embolism and blood vessel occlusion with resultant organ, and/or nervous system infarction; and/or aseptic necrosis of one or more joints. Finally, the patient was informed that Medicine is not an exact science; therefore, there is also the possibility of unforeseen or unpredictable risks and/or possible complications that may result in a catastrophic outcome. The patient indicated having understood very clearly. We have given the patient no guarantees and we have made no promises. Enough time was given to the patient to ask questions, all of which were answered to the patient's satisfaction. Thomas Cole has indicated that he wanted to continue with the procedure. Attestation: I, the ordering provider, attest that I have discussed with the patient the benefits, risks, side-effects, alternatives, likelihood of achieving goals, and potential problems during recovery for the procedure that I have provided informed consent. Date  Time: 02/13/2023  8:55 AM   Pre-Procedure Preparation:  Monitoring: As per clinic protocol. Respiration, ETCO2, SpO2, BP, heart rate and rhythm monitor placed and checked for adequate function Safety Precautions: Patient was assessed for positional comfort and pressure points before starting the procedure. Time-out: I initiated and conducted the "Time-out" before starting the procedure, as per protocol. The patient was asked to participate by confirming the accuracy of the "Time Out" information. Verification of the correct person, site, and procedure were performed and confirmed by me, the nursing staff, and the patient. "Time-out" conducted as per  Joint Commission's Universal Protocol (UP.01.01.01). Time: 0946 Start Time: 0950 hrs.  Description  Narrative of Procedure:          Rationale (medical necessity): procedure needed and proper for the diagnosis and/or treatment of the patient's medical symptoms and needs. Start Time: 0950 hrs. Safety Precautions: Aspiration looking for blood return was conducted prior to all injections. At no point did we inject any substances, as a needle was being advanced. No attempts were made at seeking any paresthesias. Safe injection practices and needle disposal techniques used. Medications properly checked for expiration dates. SDV (single dose vial) medications used. Description of procedure: Protocol guidelines were followed. The patient  was assisted into a comfortable position. The target area was identified and the area prepped in the usual manner. Skin & deeper tissues infiltrated with local anesthetic. Appropriate amount of time allowed to pass for local anesthetics to take effect. Using fluoroscopic guidance, the epidural needle was introduced through the skin, ipsilateral to the reported pain, and advanced to the target area. Posterior laminar os was contacted and the needle walked caudad, until the lamina was cleared. The ligamentum flavum was engaged and the epidural space identified using "loss-of-resistance technique" with 2-3 ml of PF-NaCl (0.9% NSS), in a 5cc dedicated LOR syringe. (See "Imaging guidance" below for use of contrast details.) Once proper needle placement was secured, and negative aspiration confirmed, the solution was injected in intermittent fashion, asking for systemic symptoms every 0.5cc. The needles were then removed and the area cleansed, making sure to leave some of the prepping solution back to take advantage of its long term bactericidal properties.  4 cc solution made of 2cc of preservative-free saline, 1 cc of 0.2% ropivacaine, 1 cc of Decadron 10 mg/cc.   Vitals:    02/13/23 0945 02/13/23 0950 02/13/23 0953 02/13/23 1000  BP: (!) 131/93 (!) 131/95 (!) 130/93 (!) 136/90  Pulse:      Resp: 10 13 15 16   Temp:      TempSrc:      SpO2: 95% 95% 95% 96%  Weight:      Height:         End Time: 0953 hrs.  Imaging Guidance (Spinal):          Type of Imaging Technique: Fluoroscopy Guidance (Spinal) Indication(s): Assistance in needle guidance and placement for procedures requiring needle placement in or near specific anatomical locations not easily accessible without such assistance. Exposure Time: Please see nurses notes. Contrast: Before injecting any contrast, we confirmed that the patient did not have an allergy to iodine, shellfish, or radiological contrast. Once satisfactory needle placement was completed at the desired level, radiological contrast was injected. Contrast injected under live fluoroscopy. No contrast complications. See chart for type and volume of contrast used. Fluoroscopic Guidance: I was personally present during the use of fluoroscopy. "Tunnel Vision Technique" used to obtain the best possible view of the target area. Parallax error corrected before commencing the procedure. "Direction-depth-direction" technique used to introduce the needle under continuous pulsed fluoroscopy. Once target was reached, antero-posterior, oblique, and lateral fluoroscopic projection used confirm needle placement in all planes. Images permanently stored in EMR. Interpretation: I personally interpreted the imaging intraoperatively. Adequate needle placement confirmed in multiple planes. Appropriate spread of contrast into desired area was observed. No evidence of afferent or efferent intravascular uptake. No intrathecal or subarachnoid spread observed. Permanent images saved into the patient's record.  Post-operative Assessment:  Post-procedure Vital Signs:  Pulse/HCG Rate: 9386 Temp: (!) 97.2 F (36.2 C) Resp: 16 BP: (!) 136/90 SpO2: 96 %  EBL:  None  Complications: No immediate post-treatment complications observed by team, or reported by patient.  Note: The patient tolerated the entire procedure well. A repeat set of vitals were taken after the procedure and the patient was kept under observation following institutional policy, for this type of procedure. Post-procedural neurological assessment was performed, showing return to baseline, prior to discharge. The patient was provided with post-procedure discharge instructions, including a section on how to identify potential problems. Should any problems arise concerning this procedure, the patient was given instructions to immediately contact us, at any time, without hesitation. In any case, we plan  to contact the patient by telephone for a follow-up status report regarding this interventional procedure.  Comments:  No additional relevant information.  Plan of Care (POC)  Orders:  Orders Placed This Encounter  Procedures   DG PAIN CLINIC C-ARM 1-60 MIN NO REPORT    Intraoperative interpretation by procedural physician at [facility].    Standing Status:   Standing    Number of Occurrences:   1    Order Specific Question:   Reason for exam:    Answer:   Assistance in needle guidance and placement for procedures requiring needle placement in or near specific anatomical locations not easily accessible without such assistance.   Chronic Opioid Analgesic:  Hydrocodone extended release 10 mg twice daily, hydrocodone immediate release 10 mg 3 times daily as needed for breakthrough pain    Medications ordered for procedure: Meds ordered this encounter  Medications   iohexol (OMNIPAQUE) 180 MG/ML injection 10 mL    Must be Myelogram-compatible. If not available, you may substitute with a water-soluble, non-ionic, hypoallergenic, myelogram-compatible radiological contrast medium.   lidocaine (XYLOCAINE) 2 % (with pres) injection 400 mg   lactated ringers infusion   midazolam  (VERSED) injection 0.5-2 mg    Make sure Flumazenil is available in the pyxis when using this medication. If oversedation occurs, administer 0.2 mg IV over 15 sec. If after 45 sec no response, administer 0.2 mg again over 1 min; may repeat at 1 min intervals; not to exceed 4 doses (1 mg)   ropivacaine (PF) 2 mg/mL (0.2%) (NAROPIN) injection 2 mL   sodium chloride flush (NS) 0.9 % injection 2 mL   dexamethasone (DECADRON) injection 10 mg   dexamethasone (DECADRON) injection 10 mg   ropivacaine (PF) 2 mg/mL (0.2%) (NAROPIN) injection 9 mL   Medications administered: We administered iohexol, lidocaine, lactated ringers, midazolam, ropivacaine (PF) 2 mg/mL (0.2%), sodium chloride flush, dexamethasone, dexamethasone, and ropivacaine (PF) 2 mg/mL (0.2%).  See the medical record for exact dosing, route, and time of administration.  Follow-up plan:   Return for Keep appt as scheduled .      Recent Visits Date Type Provider Dept  02/05/23 Office Visit Edward Jolly, MD [facility]  11/26/22 Office Visit Edward Jolly, MD [facility]  Showing recent visits within past 90 days and meeting all other requirements Today's Visits Date Type Provider Dept  02/13/23 Procedure visit Edward Jolly, MD [facility]  Showing today's visits and meeting all other requirements Future Appointments Date Type Provider Dept  05/09/23 Appointment Edward Jolly, MD [facility]  Showing future appointments within next 90 days and meeting all other requirements  Disposition: Discharge home  Discharge (Date  Time): 02/13/2023; 1001 hrs.   Primary Care Physician: Kandyce Rud, MD Location: [facility] Note by: Edward Jolly, MD (TTS technology used. I apologize for any typographical errors that were not detected and corrected.) Date: 02/13/2023; Time: 10:57 AM  Disclaimer:  Medicine is not an Visual merchandiser. The only guarantee in medicine is  that nothing is guaranteed. It is important to note that the decision to proceed with this intervention was based on the information collected from the patient. The Data and conclusions were drawn from the patient's questionnaire, the interview, and the physical examination. Because the information was provided in large part by the patient, it cannot be guaranteed that it has not been purposely or unconsciously manipulated. Every effort has been made to obtain as much relevant data as  possible for this evaluation. It is important to note that the conclusions that lead to this procedure are derived in large part from the available data. Always take into account that the treatment will also be dependent on availability of resources and existing treatment guidelines, considered by other Pain Management Practitioners as being common knowledge and practice, at the time of the intervention. For Medico-Legal purposes, it is also important to point out that variation in procedural techniques and pharmacological choices are the acceptable norm. The indications, contraindications, technique, and results of the above procedure should only be interpreted and judged by a Board-Certified Interventional Pain Specialist with extensive familiarity and expertise in the same exact procedure and technique.

## 2023-02-13 NOTE — Progress Notes (Signed)
Safety precautions to be maintained throughout the outpatient stay will include: orient to surroundings, keep bed in low position, maintain call bell within reach at all times, provide assistance with transfer out of bed and ambulation.  

## 2023-02-14 ENCOUNTER — Telehealth: Payer: Self-pay | Admitting: *Deleted

## 2023-02-14 NOTE — Telephone Encounter (Signed)
Post procedure call:   no  questions or concerns.  

## 2023-04-12 ENCOUNTER — Other Ambulatory Visit: Payer: Self-pay

## 2023-04-12 ENCOUNTER — Inpatient Hospital Stay
Admission: EM | Admit: 2023-04-12 | Discharge: 2023-04-16 | DRG: 520 | Disposition: A | Discharge status: Home Health | Payer: Medicare Other | Attending: Internal Medicine | Admitting: Internal Medicine

## 2023-04-12 ENCOUNTER — Emergency Department: Payer: Medicare Other

## 2023-04-12 DIAGNOSIS — R253 Fasciculation: Secondary | ICD-10-CM | POA: Diagnosis present

## 2023-04-12 DIAGNOSIS — Z888 Allergy status to other drugs, medicaments and biological substances status: Secondary | ICD-10-CM

## 2023-04-12 DIAGNOSIS — E882 Lipomatosis, not elsewhere classified: Secondary | ICD-10-CM | POA: Diagnosis present

## 2023-04-12 DIAGNOSIS — M48061 Spinal stenosis, lumbar region without neurogenic claudication: Secondary | ICD-10-CM | POA: Diagnosis not present

## 2023-04-12 DIAGNOSIS — K219 Gastro-esophageal reflux disease without esophagitis: Secondary | ICD-10-CM | POA: Diagnosis present

## 2023-04-12 DIAGNOSIS — M4800 Spinal stenosis, site unspecified: Secondary | ICD-10-CM | POA: Diagnosis not present

## 2023-04-12 DIAGNOSIS — Z833 Family history of diabetes mellitus: Secondary | ICD-10-CM

## 2023-04-12 DIAGNOSIS — G4733 Obstructive sleep apnea (adult) (pediatric): Secondary | ICD-10-CM | POA: Diagnosis present

## 2023-04-12 DIAGNOSIS — J4520 Mild intermittent asthma, uncomplicated: Secondary | ICD-10-CM | POA: Diagnosis not present

## 2023-04-12 DIAGNOSIS — Z6837 Body mass index (BMI) 37.0-37.9, adult: Secondary | ICD-10-CM

## 2023-04-12 DIAGNOSIS — F419 Anxiety disorder, unspecified: Secondary | ICD-10-CM | POA: Diagnosis present

## 2023-04-12 DIAGNOSIS — M4726 Other spondylosis with radiculopathy, lumbar region: Secondary | ICD-10-CM | POA: Diagnosis present

## 2023-04-12 DIAGNOSIS — G40909 Epilepsy, unspecified, not intractable, without status epilepticus: Secondary | ICD-10-CM | POA: Diagnosis present

## 2023-04-12 DIAGNOSIS — Z91030 Bee allergy status: Secondary | ICD-10-CM

## 2023-04-12 DIAGNOSIS — Z9049 Acquired absence of other specified parts of digestive tract: Secondary | ICD-10-CM

## 2023-04-12 DIAGNOSIS — Z79899 Other long term (current) drug therapy: Secondary | ICD-10-CM

## 2023-04-12 DIAGNOSIS — Z8249 Family history of ischemic heart disease and other diseases of the circulatory system: Secondary | ICD-10-CM

## 2023-04-12 DIAGNOSIS — Z818 Family history of other mental and behavioral disorders: Secondary | ICD-10-CM

## 2023-04-12 DIAGNOSIS — F32A Depression, unspecified: Secondary | ICD-10-CM | POA: Diagnosis present

## 2023-04-12 DIAGNOSIS — I10 Essential (primary) hypertension: Secondary | ICD-10-CM | POA: Diagnosis not present

## 2023-04-12 DIAGNOSIS — Z882 Allergy status to sulfonamides status: Secondary | ICD-10-CM

## 2023-04-12 DIAGNOSIS — Z87898 Personal history of other specified conditions: Secondary | ICD-10-CM

## 2023-04-12 DIAGNOSIS — E669 Obesity, unspecified: Secondary | ICD-10-CM | POA: Diagnosis present

## 2023-04-12 DIAGNOSIS — Z8261 Family history of arthritis: Secondary | ICD-10-CM

## 2023-04-12 DIAGNOSIS — Z883 Allergy status to other anti-infective agents status: Secondary | ICD-10-CM

## 2023-04-12 DIAGNOSIS — M519 Unspecified thoracic, thoracolumbar and lumbosacral intervertebral disc disorder: Principal | ICD-10-CM

## 2023-04-12 DIAGNOSIS — Z825 Family history of asthma and other chronic lower respiratory diseases: Secondary | ICD-10-CM

## 2023-04-12 DIAGNOSIS — G894 Chronic pain syndrome: Secondary | ICD-10-CM | POA: Diagnosis present

## 2023-04-12 DIAGNOSIS — M5106 Intervertebral disc disorders with myelopathy, lumbar region: Secondary | ICD-10-CM

## 2023-04-12 DIAGNOSIS — F418 Other specified anxiety disorders: Secondary | ICD-10-CM | POA: Diagnosis present

## 2023-04-12 DIAGNOSIS — R29898 Other symptoms and signs involving the musculoskeletal system: Secondary | ICD-10-CM

## 2023-04-12 DIAGNOSIS — Z981 Arthrodesis status: Secondary | ICD-10-CM

## 2023-04-12 DIAGNOSIS — M5116 Intervertebral disc disorders with radiculopathy, lumbar region: Secondary | ICD-10-CM | POA: Diagnosis present

## 2023-04-12 DIAGNOSIS — E119 Type 2 diabetes mellitus without complications: Secondary | ICD-10-CM

## 2023-04-12 DIAGNOSIS — M5416 Radiculopathy, lumbar region: Secondary | ICD-10-CM

## 2023-04-12 DIAGNOSIS — Z7984 Long term (current) use of oral hypoglycemic drugs: Secondary | ICD-10-CM

## 2023-04-12 DIAGNOSIS — Z885 Allergy status to narcotic agent status: Secondary | ICD-10-CM

## 2023-04-12 MED ORDER — INSULIN ASPART 100 UNIT/ML IJ SOLN
0.0000 [IU] | Freq: Every day | INTRAMUSCULAR | Status: DC
  Administered 2023-04-13 – 2023-04-14 (×2): 2 [IU] via SUBCUTANEOUS
  Filled 2023-04-12 (×2): qty 1

## 2023-04-12 MED ORDER — ALBUTEROL SULFATE HFA 108 (90 BASE) MCG/ACT IN AERS: 2.0000 | INHALATION_SPRAY | RESPIRATORY_TRACT | Status: DC | PRN

## 2023-04-12 MED ORDER — PREDNISONE 20 MG PO TABS
60.0000 mg | ORAL_TABLET | Freq: Once | ORAL | Status: AC
  Administered 2023-04-12: 60 mg via ORAL
  Filled 2023-04-12: qty 3

## 2023-04-12 MED ORDER — HYDROCODONE-ACETAMINOPHEN 10-325 MG PO TABS
1.0000 | ORAL_TABLET | ORAL | Status: DC | PRN
  Administered 2023-04-13 – 2023-04-16 (×4): 1 via ORAL
  Filled 2023-04-12 (×5): qty 1

## 2023-04-12 MED ORDER — HYDRALAZINE HCL 20 MG/ML IJ SOLN: 5.0000 mg | INTRAMUSCULAR | Status: DC | PRN

## 2023-04-12 MED ORDER — DM-GUAIFENESIN ER 30-600 MG PO TB12: 1.0000 | ORAL_TABLET | Freq: Two times a day (BID) | ORAL | Status: DC | PRN

## 2023-04-12 MED ORDER — LIDOCAINE 5 % EX PTCH
1.0000 | MEDICATED_PATCH | CUTANEOUS | Status: DC
  Administered 2023-04-13: 1 via TRANSDERMAL
  Filled 2023-04-12: qty 1

## 2023-04-12 MED ORDER — LORAZEPAM 2 MG/ML IJ SOLN
2.0000 mg | INTRAMUSCULAR | Status: DC | PRN
  Administered 2023-04-13: 2 mg via INTRAVENOUS
  Filled 2023-04-12: qty 1

## 2023-04-12 MED ORDER — ALBUTEROL SULFATE (2.5 MG/3ML) 0.083% IN NEBU: 2.5000 mg | INHALATION_SOLUTION | RESPIRATORY_TRACT | Status: DC | PRN

## 2023-04-12 MED ORDER — HEPARIN SODIUM (PORCINE) 5000 UNIT/ML IJ SOLN
5000.0000 [IU] | Freq: Three times a day (TID) | INTRAMUSCULAR | Status: DC
  Administered 2023-04-13 – 2023-04-16 (×11): 5000 [IU] via SUBCUTANEOUS
  Filled 2023-04-12 (×11): qty 1

## 2023-04-12 MED ORDER — INSULIN ASPART 100 UNIT/ML IJ SOLN
0.0000 [IU] | Freq: Three times a day (TID) | INTRAMUSCULAR | Status: DC
  Administered 2023-04-13 (×2): 1 [IU] via SUBCUTANEOUS
  Administered 2023-04-14 – 2023-04-15 (×3): 2 [IU] via SUBCUTANEOUS
  Administered 2023-04-16: 1 [IU] via SUBCUTANEOUS
  Filled 2023-04-12 (×7): qty 1

## 2023-04-12 MED ORDER — HYDROMORPHONE HCL 2 MG PO TABS
1.0000 mg | ORAL_TABLET | ORAL | Status: AC | PRN
Start: 2023-04-12 — End: 2023-04-13
  Administered 2023-04-13: 1 mg via ORAL
  Filled 2023-04-12: qty 1

## 2023-04-12 MED ORDER — METHOCARBAMOL 500 MG PO TABS: 500.0000 mg | ORAL_TABLET | Freq: Three times a day (TID) | ORAL | Status: DC | PRN

## 2023-04-12 MED ORDER — ONDANSETRON HCL 4 MG/2ML IJ SOLN: 4.0000 mg | Freq: Three times a day (TID) | INTRAMUSCULAR | Status: DC | PRN

## 2023-04-12 MED ORDER — METHOCARBAMOL 1000 MG/10ML IJ SOLN
1000.0000 mg | Freq: Once | INTRAMUSCULAR | Status: AC
  Administered 2023-04-12: 1000 mg via INTRAMUSCULAR
  Filled 2023-04-12: qty 10

## 2023-04-12 MED ORDER — LORAZEPAM 1 MG PO TABS
2.0000 mg | ORAL_TABLET | ORAL | Status: AC
  Administered 2023-04-12: 2 mg via ORAL
  Filled 2023-04-12: qty 2

## 2023-04-12 MED ORDER — HYDROMORPHONE HCL 2 MG PO TABS
4.0000 mg | ORAL_TABLET | ORAL | Status: AC | PRN
  Administered 2023-04-12: 4 mg via ORAL
  Filled 2023-04-12: qty 2

## 2023-04-12 MED ORDER — ACETAMINOPHEN 325 MG PO TABS
650.0000 mg | ORAL_TABLET | Freq: Four times a day (QID) | ORAL | Status: DC | PRN
  Administered 2023-04-15 – 2023-04-16 (×3): 650 mg via ORAL
  Filled 2023-04-12 (×3): qty 2

## 2023-04-12 NOTE — ED Triage Notes (Signed)
 Pt to ED [facility] from home for lower right sided back pain radiating down right leg x2 days. Reports hx back surgery. No recent injuries or falls. Pt sees pain management.

## 2023-04-12 NOTE — ED Provider Notes (Signed)
 [facility] Provider Note    Event Date/Time   First MD Initiated Contact with Patient 04/12/23 1845     (approximate)   History   No chief complaint on file.   HPI  Thomas Cole is a 53 y.o. male who presents today with history of back pain located in the right lower back, pain radiates to the right lower leg.  Patient describes pain as a needles.  Patient denies fecal or urinary incontinence.  Patient denies saddle anesthesia.      Physical Exam   Triage Vital Signs: ED Triage Vitals  Encounter Vitals Group     BP 04/12/23 1838 (!) 145/89     Systolic BP Percentile --      Diastolic BP Percentile --      Pulse Rate 04/12/23 1836 89     Resp 04/12/23 1836 18     Temp 04/12/23 1836 98 F (36.7 C)     Temp src --      SpO2 04/12/23 1836 96 %     Weight 04/12/23 1837 300 lb (136.1 kg)     Height 04/12/23 1837 6' 3 (1.905 m)     Head Circumference --      Peak Flow --      Pain Score 04/12/23 1837 10     Pain Loc --      Pain Education --      Exclude from Growth Chart --     Most recent vital signs: Vitals:   04/12/23 2200 04/12/23 2230  BP: 130/72 132/68  Pulse: (!) 105 97  Resp:  20  Temp:    SpO2: 95% 94%     Constitutional: Alert mild distress Eyes: Conjunctivae are normal.  Head: Atraumatic. Nose: No congestion/rhinnorhea. Mouth/Throat: Mucous membranes are moist.   Neck: Painless ROM.  Cardiovascular:   Good peripheral circulation. Respiratory: Normal respiratory effort.  No retractions.  Gastrointestinal: Soft and nontender.  Musculoskeletal:  no deformity.  Lower extremities no edema Neurologic:  MAE spontaneously. No gross focal neurologic deficits are appreciated.  Right lower back tender to palpation.  Saddle anesthesia negative Skin:  Skin is warm, dry and intact. No rash noted. Psychiatric: Mood and affect are normal. Speech and behavior are normal.    ED Results / Procedures / Treatments   Labs (all labs  ordered are listed, but only abnormal results are displayed) Labs Reviewed  PROTIME-INR  APTT  CBC  BASIC METABOLIC PANEL  HIV ANTIBODY (ROUTINE TESTING W REFLEX)  TYPE AND SCREEN     EKG    RADIOLOGY I independently reviewed and interpreted imaging and agree with radiologists findings.      PROCEDURES:  Critical Care performed:   Procedures   MEDICATIONS ORDERED IN ED: Medications  HYDROmorphone  (DILAUDID ) tablet 1 mg (has no administration in time range)  HYDROcodone -acetaminophen  (NORCO) 10-325 MG per tablet 1 tablet (has no administration in time range)  methocarbamol  (ROBAXIN ) tablet 500 mg (has no administration in time range)  lidocaine  (LIDODERM ) 5 % 1 patch (has no administration in time range)  acetaminophen  (TYLENOL ) tablet 650 mg (has no administration in time range)  dextromethorphan-guaiFENesin  (MUCINEX  DM) 30-600 MG per 12 hr tablet 1 tablet (has no administration in time range)  ondansetron  (ZOFRAN ) injection 4 mg (has no administration in time range)  hydrALAZINE  (APRESOLINE ) injection 5 mg (has no administration in time range)  LORazepam  (ATIVAN ) injection 2 mg (has no administration in time range)  insulin  aspart (novoLOG ) injection 0-5  Units (has no administration in time range)  insulin  aspart (novoLOG ) injection 0-9 Units (has no administration in time range)  albuterol  (PROVENTIL ) (2.5 MG/3ML) 0.083% nebulizer solution 2.5 mg (has no administration in time range)  heparin  injection 5,000 Units (has no administration in time range)  HYDROmorphone  (DILAUDID ) tablet 4 mg (4 mg Oral Given 04/12/23 2021)  LORazepam  (ATIVAN ) tablet 2 mg (2 mg Oral Given 04/12/23 2012)  methocarbamol  (ROBAXIN ) injection 1,000 mg (1,000 mg Intramuscular Given 04/12/23 2149)  predniSONE  (DELTASONE ) tablet 60 mg (60 mg Oral Given 04/12/23 2210)     IMPRESSION / MDM / ASSESSMENT AND PLAN / ED COURSE  I reviewed the triage vital signs and the nursing notes.  Differential  diagnosis includes, but is not limited to, foraminal stenosis, compression fracture, radiculopathy  Patient's presentation is most consistent with acute complicated illness / injury requiring diagnostic workup.   Patient's diagnosis is consistent with foraminal stenosis. I independently reviewed and interpreted imaging and agree with radiologists findings. Labs are rea reassuring.  Neurosurgery was consulted who recommended prednisone , admission to the hospital, n.p.o. after midnight.  Neurosurgery will evaluate the patient in the morning for  possible decompression I did review the patient's allergies and medications. Patient will be admitted. Discussed plan of care with patient, answered all of patient's questions, Patient agreeable to plan of care. Patient verbalized understanding.     FINAL CLINICAL IMPRESSION(S) / ED DIAGNOSES   Final diagnoses:  Foraminal stenosis due to intervertebral disc disease     Rx / DC Orders   ED Discharge Orders     None        Note:  This document was prepared using Dragon voice recognition software and may include unintentional dictation errors.   Janit Kast, PA-C 04/12/23 2330    Willo Dunnings, MD 04/20/23 [id number]

## 2023-04-12 NOTE — H&P (Addendum)
 History and Physical    Thomas Cole:[id number] DOB: 06/16/1970 DOA: 04/12/2023  Referring MD/NP/PA:   PCP: Diedra Lame, MD   Patient coming from:  The patient is coming from home.     Chief Complaint: back pain  HPI: Thomas Cole is a 53 y.o. male with medical history significant of  spinal cord injury, chronic neck and back pain, remote history of  lumbar decompressive surgery and cervical spine surgery, HTN, DM, asthma, depression with anxiety, seizure, PTSD, obesity, OSA not on CPAP, migraine, who presents with back pain.  Patient states that his lower back pain has been progressively worsening the past 2 days.  Her lower back pain is very severe, burning-like pain, radiating to the lateral side of right leg and groin area.  The pain is aggravated by movement. No fall or injury.  No loss control of bladder or bowel movement.  Patient has difficulty walking.  He denies chest pain, cough, SOB.  No nausea, vomiting, diarrhea or abdominal pain.  No symptoms of UTI.  Data reviewed independently and ED Course: pt was found to have temperature normal, blood pressure 132/68, heart rate 105, 97, RR 20, oxygen saturation 94% on room air.  Pending CBC and BMP. Pt is placed on MedSurg bed for observation.  Dr. Claudene of neurosurgery is consulted.  MRI of lumbar spine: 1. Right subarticular disc extrusion with superior migration and probable sequestration at L4-5. Findings superimposed on additional multifactorial degenerative changes at this level resultant severe canal with bilateral subarticular stenosis, with severe right and moderate to severe left L4 foraminal narrowing. Finding could contribute to right-sided radicular symptoms. 2. Additional multilevel lumbar spondylosis with resultant severe spinal stenosis at L2-3, L3-4, and L4-5. 3. Multifactorial degenerative changes with resultant multilevel foraminal narrowing as above. Notable findings include mild to moderate bilateral  L2 and L3 foraminal stenosis, with moderate left worse than right L5 foraminal narrowing.    EKG: Not done in ED, will get one.      Review of Systems:   General: no fevers, chills, no body weight gain, has fatigue HEENT: no blurry vision, hearing changes or sore throat Respiratory: no dyspnea, coughing, wheezing CV: no chest pain, no palpitations GI: no nausea, vomiting, abdominal pain, diarrhea, constipation GU: no dysuria, burning on urination, increased urinary frequency, hematuria  Ext: no leg edema Neuro: no unilateral weakness, numbness, or tingling, no vision change or hearing loss Skin: no rash, no skin tear. MSK: has back pain Heme: No easy bruising.  Travel history: No recent long distant travel.   Allergy:  Allergies  Allergen Reactions   Amitriptyline Other (See Comments)    Felt really bad- psychologically   Bee Venom Itching and Swelling   Chlorhexidine Itching    REACTION TO WIPES/ CLOTHES ONLY==he can tolerate the SCRUB LIQUID   Carbamazepine Itching and Rash   Meloxicam Itching and Rash   Morphine  And Codeine Anxiety    Makes him restless  Makes him restless    Sulfa Antibiotics Itching, Rash and Other (See Comments)    Past Medical History:  Diagnosis Date   Anxiety    Asthma    Back problem    Benign essential hypertension 11/30/2016   Cervical spondylosis without myelopathy 09/17/2012   Complication of anesthesia    occ takes longer to wake   Depression    Diabetes mellitus without complication (HCC) 02/2020   Type 2   GERD (gastroesophageal reflux disease)    Hypertension    Labile  hypertension 03/19/2013   No current neuro changes, headache much improved. No clear sign of intracranial bleed. NO indication for head CT>  BP much better now... Fluctuates a lot.  Will eval for pheochromocytoma. As recommended at last cardiology OV.. Will prescribe hydralazine  to use as needed for BP fluctuations.  Close follow up with PCP.    Major depressive  disorder, recurrent episode, severe (HCC) 06/23/2015   Migraine headache    Migraines 11/11/2013   S/P appendectomy 04/09/2011   Seizure (HCC) 11/30/2016   Seizures (HCC)    last 87   Shortness of breath    SOB (shortness of breath) 08/06/2014   - [facility] eval with cpst 02/20/2008 exercise testing demonstrateed normal functional capabilities and a normal cardiopulmonary response to exercise. - 09/16/2014  Walked RA x 3 laps @ 185 ft each stopped due to end of study/ nl pace/ no desat  - spirometry 09/16/2014 > no obst/ min restriction       Past Surgical History:  Procedure Laterality Date   ANTERIOR CERVICAL CORPECTOMY N/A 09/15/2012   Procedure: Cervical Three-Four,Cervical Six-Seven Anterior cervical decompression/diskectomy fusion with Cervical Four Corpectomy;  Surgeon: Victory Gens, MD;  Location: [facility];  Service: Neurosurgery;  Laterality: N/A;  Cervical Three-Four,Cervical Six-Seven  Anterior cervical decompression/diskectomy fusion with Cervical four Corpectomy   APPENDECTOMY  12   CERVICAL DISC SURGERY     CHOLECYSTECTOMY N/A 03/29/2017   Procedure: LAPAROSCOPIC CHOLECYSTECTOMY;  Surgeon: Wonda Charlie BRAVO, MD;  Location: [facility];  Service: General;  Laterality: N/A;   COLONOSCOPY WITH PROPOFOL  N/A 10/23/2019   Procedure: COLONOSCOPY WITH PROPOFOL ;  Surgeon: Dessa Reyes ORN, MD;  Location: [facility];  Service: Endoscopy;  Laterality: N/A;   ESOPHAGOGASTRODUODENOSCOPY (EGD) WITH PROPOFOL  N/A 10/23/2019   Procedure: ESOPHAGOGASTRODUODENOSCOPY (EGD) WITH PROPOFOL ;  Surgeon: Dessa Reyes ORN, MD;  Location: [facility];  Service: Endoscopy;  Laterality: N/A;   KNEE SURGERY     bilateral   POSTERIOR FUSION CERVICAL SPINE      Social History:  reports that he has never smoked. He has never used smokeless tobacco. He reports that he does not drink alcohol and does not use drugs.  Family History:  Family History  Problem Relation Age of Onset   Coronary artery disease Mother     Hypertension Mother    Mental illness Mother    Arthritis Mother    Asthma Mother    Diabetes Father    Coronary artery disease Father    Heart disease Father    Arthritis Father    Arthritis Maternal Grandmother    Heart disease Maternal Grandmother    Hypertension Maternal Grandmother    Arthritis Maternal Grandfather    Heart disease Maternal Grandfather    Hypertension Maternal Grandfather    Diabetes Paternal Grandmother    Arthritis Paternal Grandmother    Heart disease Paternal Grandmother    Hypertension Paternal Grandmother    Diabetes Paternal Grandfather    Arthritis Paternal Grandfather    Heart disease Paternal Grandfather    Hypertension Paternal Grandfather    Cancer Neg Hx    Stroke Neg Hx      Prior to Admission medications   Medication Sig Start Date End Date Taking? Authorizing Provider  carbidopa -levodopa  (SINEMET  CR) 50-200 MG tablet Take 1 tablet by mouth at bedtime.    [provider]  carbidopa -levodopa  (SINEMET  IR) 25-100 MG tablet TAKE 2 TABLETS BY MOUTH 3 (THREE) TIMES DAILY 06/25/18   [provider]  Cholecalciferol  (VITAMIN D3)  25 MCG (1000 UT) CAPS Take 1 capsule by mouth daily.    [provider]  clonazePAM  (KLONOPIN ) 1 MG tablet Take 1 mg by mouth at bedtime as needed. 04/10/19   [provider]  DULoxetine  (CYMBALTA ) 60 MG capsule Take 1 capsule (60 mg total) by mouth daily after breakfast. 02/05/23 08/04/23  Marcelino Nurse, MD  furosemide  (LASIX ) 20 MG tablet Take 20 mg by mouth daily.    [provider]  hydrochlorothiazide  (HYDRODIURIL ) 25 MG tablet Take 25 mg by mouth daily. 07/03/18   [provider]  HYDROcodone -acetaminophen  (NORCO) 10-325 MG tablet Take 1 tablet by mouth every 8 (eight) hours as needed. For chronic pain syndrome. Each Rx to last 30 days. 08/22/22 09/21/22  Marcelino Nurse, MD  HYDROcodone -acetaminophen  (NORCO) 10-325 MG tablet Take 1 tablet by mouth every 8 (eight) hours as  needed. For chronic pain syndrome. Each Rx to last 30 days. 02/14/23 03/16/23  Marcelino Nurse, MD  HYDROcodone -acetaminophen  (NORCO) 10-325 MG tablet Take 1 tablet by mouth every 8 (eight) hours as needed. For chronic pain syndrome. Each Rx to last 30 days. 03/16/23 04/15/23  Marcelino Nurse, MD  HYDROcodone -acetaminophen  (NORCO) 10-325 MG tablet Take 1 tablet by mouth every 8 (eight) hours as needed. For chronic pain syndrome. Each Rx to last 30 days. 04/15/23 05/15/23  Marcelino Nurse, MD  lamoTRIgine  (LAMICTAL ) 25 MG tablet Take 25 mg by mouth at bedtime. Titration schedule 01/24/23 03/23/23  [provider]  losartan  (COZAAR ) 100 MG tablet Take 100 mg by mouth daily. 09/23/19   [provider]  metFORMIN (GLUCOPHAGE) 500 MG tablet Take 500 mg by mouth daily after lunch.    [provider]  metoprolol  succinate (TOPROL -XL) 25 MG 24 hr tablet Take 12.5 mg by mouth daily. 08/10/21 02/13/23  [provider]  nortriptyline  (PAMELOR ) 25 MG capsule Take 2 capsules (50 mg total) by mouth at bedtime. 02/05/23   Marcelino Nurse, MD  omeprazole  (PRILOSEC) 40 MG capsule Take 1 capsule by mouth 2 (two) times daily. 06/16/20   [provider]  phenytoin  (DILANTIN ) 100 MG ER capsule Take 100 mg by mouth. 2 in a.m. 3 at night 07/13/20   [provider]  pregabalin  (LYRICA ) 100 MG capsule Take 1 capsule (100 mg total) by mouth 3 (three) times daily. 02/05/23 08/04/23  Lateef, Bilal, MD  sildenafil (VIAGRA) 100 MG tablet 1/2 to 1 tab daily as needed prior to sex 09/19/21   [provider]  vitamin B-12 (CYANOCOBALAMIN ) 1000 MCG tablet Take 1,000 mcg by mouth daily.    [provider]    Physical Exam: Vitals:   04/12/23 2100 04/12/23 2130 04/12/23 2200 04/12/23 2230  BP: (!) 149/82 (!) 150/86 130/72 132/68  Pulse: 100 (!) 104 (!) 105 97  Resp:    20  Temp:      TempSrc:      SpO2: 100% 100% 95% 94%  Weight:      Height:       General: Not in acute  distress HEENT:       Eyes: PERRL, EOMI, no jaundice       ENT: No discharge from the ears and nose, no pharynx injection, no tonsillar enlargement.        Neck: No JVD, no bruit, no mass felt. Heme: No neck lymph node enlargement. Cardiac: S1/S2, RRR, No murmurs, No gallops or rubs. Respiratory: No rales, wheezing, rhonchi or rubs. GI: Soft, nondistended, nontender, no rebound pain, no organomegaly, BS present. GU: No  hematuria Ext: No pitting leg edema bilaterally. 1+DP/PT pulse bilaterally. Musculoskeletal: has tenderness in the midline of lower back Skin: No rashes.  Neuro: Alert, oriented X3, cranial nerves II-XII grossly intact, moves all extremities Psych: Patient is not psychotic, no suicidal or hemocidal ideation.  Labs on Admission: I have personally reviewed following labs and imaging studies  CBC: No results for input(s): WBC, NEUTROABS, HGB, HCT, MCV, PLT in the last 168 hours. Basic Metabolic Panel: No results for input(s): NA, K, CL, CO2, GLUCOSE, BUN, CREATININE, CALCIUM, MG, PHOS in the last 168 hours. GFR: CrCl cannot be calculated (Patient's most recent lab result is older than the maximum 21 days allowed.). Liver Function Tests: No results for input(s): AST, ALT, ALKPHOS, BILITOT, PROT, ALBUMIN  in the last 168 hours. No results for input(s): LIPASE, AMYLASE in the last 168 hours. No results for input(s): AMMONIA in the last 168 hours. Coagulation Profile: No results for input(s): INR, PROTIME in the last 168 hours. Cardiac Enzymes: No results for input(s): CKTOTAL, CKMB, CKMBINDEX, TROPONINI in the last 168 hours. BNP (last 3 results) No results for input(s): PROBNP in the last 8760 hours. HbA1C: No results for input(s): HGBA1C in the last 72 hours. CBG: No results for input(s): GLUCAP in the last 168 hours. Lipid Profile: No results for input(s): CHOL, HDL, LDLCALC, TRIG, CHOLHDL,  LDLDIRECT in the last 72 hours. Thyroid  Function Tests: No results for input(s): TSH, T4TOTAL, FREET4, T3FREE, THYROIDAB in the last 72 hours. Anemia Panel: No results for input(s): VITAMINB12, FOLATE, FERRITIN, TIBC, IRON, RETICCTPCT in the last 72 hours. Urine analysis:    Component Value Date/Time   COLORURINE YELLOW 04/28/2021 0033   APPEARANCEUR CLEAR 04/28/2021 0033   LABSPEC 1.020 04/28/2021 0033   PHURINE 5.5 04/28/2021 0033   GLUCOSEU 250 (A) 04/28/2021 0033   HGBUR NEGATIVE 04/28/2021 0033   BILIRUBINUR NEGATIVE 04/28/2021 0033   KETONESUR TRACE (A) 04/28/2021 0033   PROTEINUR NEGATIVE 04/28/2021 0033   UROBILINOGEN 0.2 01/20/2009 2048   NITRITE NEGATIVE 04/28/2021 0033   LEUKOCYTESUR NEGATIVE 04/28/2021 0033   Sepsis Labs: @LABRCNTIP (procalcitonin:4,lacticidven:4) )No results found for this or any previous visit (from the past 240 hours).   Radiological Exams on Admission:   Assessment/Plan Principal Problem:   Spinal stenosis of lumbar region with radiculopathy Active Problems:   Asthma, mild intermittent   HTN (hypertension)   Diabetes mellitus without complication (HCC)   History of seizures   Depression with anxiety   Obesity (BMI 30-39.9)   Assessment and Plan:   Spinal stenosis of lumbar region with radiculopathy: MRI showed stenosis worsened by epidural lipomatosis and also has disc herniations causing worsening stenosis. Consulted Dr. Claudene of neurosurgery. Will admit pt for pain control, but if patient continues to worsen or has persistent deficits despite impoved pain control, may need decompression surgery per Dr. Claudene.  - will place in Med-surg bed for obs --> if pt needs surgery, will need to change to inpt status - Pain control: prn Dilaudid , Norco and tylenol  - When necessary Zofran  for nausea - prnTizanidine for muscle spasm - Continue home Sinemet  - Lidoderm  patch for pain -Prednisone  60 mg daily - type and  cross - INR/PTT - PT/OT when able to (not ordered now) - N.p.o. after midnight  Asthma, mild intermittent: Stable -Bronchodilators and as needed Mucinex   HTN (hypertension): -IV hydralazine  as needed -Lasix , metoprolol , HCTZ, losartan   Diabetes mellitus without complication (HCC): Recent A1c 6.1, well-controlled.  Patient is taking metformin -Sliding scale insulin   History of seizures:  Patient states that his neurologist is making adjustment for his medications.  His Dilantin  dose is tapering down, and started Lamictal  50 mg twice daily.  He used to take Dilantin  200 mg in the morning and 300 mg in the evening.  He does not remember exact dose of Dilantin , but stating that he is most likely taking 100 mg twice daily -will continue Dilantin  100 mg twice daily -Lamictal  50 mg twice daily -Seizure precaution -As needed Ativan  for seizure  Depression with anxiety -Continue home medications  Obesity (BMI 30-39.9): Body weight 136.1 kg, BMI 37.5 -Encourage losing weight -Exercise and healthy diet       DVT ppx: SQ Heparin    Code Status: Full code   Family Communication: not done, no family member is at bed side.     Disposition Plan:  Anticipate discharge back to previous environment  Consults called:  Dr. Claudene of neurosurgery  Admission status and Level of care: Med-Surg:    for obs     Dispo: The patient is from: Home              Anticipated d/c is to: Home              Anticipated d/c date is: 1 day              Patient currently is not medically stable to d/c.    Severity of Illness:  The appropriate patient status for this patient is OBSERVATION. Observation status is judged to be reasonable and necessary in order to provide the required intensity of service to ensure the patient's safety. The patient's presenting symptoms, physical exam findings, and initial radiographic and laboratory data in the context of their medical condition is felt to place them at  decreased risk for further clinical deterioration. Furthermore, it is anticipated that the patient will be medically stable for discharge from the hospital within 2 midnights of admission.        Date of Service 04/13/2023    Caleb Exon [facility]   If 7PM-7AM, please contact night-coverage [email or web address] 04/13/2023, 12:24 AM

## 2023-04-12 NOTE — Progress Notes (Signed)
 Reviewed imaging. Patient with chronic neck and back pain. Hisotry of spinal cord injury, chronic neck and back pain.  Remote history of  lumbar decompressive surgery and history of cervical spine surgery.  Presenting with an acute on chronic onset of back pain and lower extremity pain.  Difficulty with walking secondary to pain.  MRI imaging demonstrates congenital stenosis worsened by epidural lipomatosis.  At 2 levels are appear to be disc herniations causing worsening stenosis.  Agree with admission for pain control, given radicular nature can try a steroid dose.  If he continues to worsen or has persistent deficits despite impoved pain control may be a candidate for decompression.  Please keep n.p.o. overnight in case.  Will see on morning rounds and make final decision.

## 2023-04-13 DIAGNOSIS — Z888 Allergy status to other drugs, medicaments and biological substances status: Secondary | ICD-10-CM | POA: Diagnosis not present

## 2023-04-13 DIAGNOSIS — F32A Depression, unspecified: Secondary | ICD-10-CM | POA: Diagnosis present

## 2023-04-13 DIAGNOSIS — G894 Chronic pain syndrome: Secondary | ICD-10-CM | POA: Diagnosis present

## 2023-04-13 DIAGNOSIS — Z87898 Personal history of other specified conditions: Secondary | ICD-10-CM | POA: Diagnosis not present

## 2023-04-13 DIAGNOSIS — F419 Anxiety disorder, unspecified: Secondary | ICD-10-CM | POA: Diagnosis present

## 2023-04-13 DIAGNOSIS — Z6837 Body mass index (BMI) 37.0-37.9, adult: Secondary | ICD-10-CM | POA: Diagnosis not present

## 2023-04-13 DIAGNOSIS — Z882 Allergy status to sulfonamides status: Secondary | ICD-10-CM | POA: Diagnosis not present

## 2023-04-13 DIAGNOSIS — E669 Obesity, unspecified: Secondary | ICD-10-CM | POA: Diagnosis present

## 2023-04-13 DIAGNOSIS — R29898 Other symptoms and signs involving the musculoskeletal system: Secondary | ICD-10-CM | POA: Diagnosis not present

## 2023-04-13 DIAGNOSIS — Z981 Arthrodesis status: Secondary | ICD-10-CM | POA: Diagnosis not present

## 2023-04-13 DIAGNOSIS — M5106 Intervertebral disc disorders with myelopathy, lumbar region: Secondary | ICD-10-CM | POA: Diagnosis not present

## 2023-04-13 DIAGNOSIS — G40909 Epilepsy, unspecified, not intractable, without status epilepticus: Secondary | ICD-10-CM | POA: Diagnosis present

## 2023-04-13 DIAGNOSIS — I10 Essential (primary) hypertension: Secondary | ICD-10-CM | POA: Diagnosis present

## 2023-04-13 DIAGNOSIS — M519 Unspecified thoracic, thoracolumbar and lumbosacral intervertebral disc disorder: Principal | ICD-10-CM

## 2023-04-13 DIAGNOSIS — Z825 Family history of asthma and other chronic lower respiratory diseases: Secondary | ICD-10-CM | POA: Diagnosis not present

## 2023-04-13 DIAGNOSIS — Z8249 Family history of ischemic heart disease and other diseases of the circulatory system: Secondary | ICD-10-CM | POA: Diagnosis not present

## 2023-04-13 DIAGNOSIS — Z9049 Acquired absence of other specified parts of digestive tract: Secondary | ICD-10-CM | POA: Diagnosis not present

## 2023-04-13 DIAGNOSIS — F418 Other specified anxiety disorders: Secondary | ICD-10-CM | POA: Diagnosis present

## 2023-04-13 DIAGNOSIS — Z885 Allergy status to narcotic agent status: Secondary | ICD-10-CM | POA: Diagnosis not present

## 2023-04-13 DIAGNOSIS — M4800 Spinal stenosis, site unspecified: Secondary | ICD-10-CM | POA: Diagnosis present

## 2023-04-13 DIAGNOSIS — Z79899 Other long term (current) drug therapy: Secondary | ICD-10-CM | POA: Diagnosis not present

## 2023-04-13 DIAGNOSIS — Z8261 Family history of arthritis: Secondary | ICD-10-CM | POA: Diagnosis not present

## 2023-04-13 DIAGNOSIS — J4520 Mild intermittent asthma, uncomplicated: Secondary | ICD-10-CM | POA: Diagnosis present

## 2023-04-13 DIAGNOSIS — Z91030 Bee allergy status: Secondary | ICD-10-CM | POA: Diagnosis not present

## 2023-04-13 DIAGNOSIS — Z818 Family history of other mental and behavioral disorders: Secondary | ICD-10-CM | POA: Diagnosis not present

## 2023-04-13 DIAGNOSIS — E119 Type 2 diabetes mellitus without complications: Secondary | ICD-10-CM | POA: Diagnosis present

## 2023-04-13 DIAGNOSIS — M48061 Spinal stenosis, lumbar region without neurogenic claudication: Secondary | ICD-10-CM | POA: Diagnosis present

## 2023-04-13 DIAGNOSIS — M5116 Intervertebral disc disorders with radiculopathy, lumbar region: Secondary | ICD-10-CM | POA: Diagnosis present

## 2023-04-13 DIAGNOSIS — Z7984 Long term (current) use of oral hypoglycemic drugs: Secondary | ICD-10-CM | POA: Diagnosis not present

## 2023-04-13 DIAGNOSIS — M4726 Other spondylosis with radiculopathy, lumbar region: Secondary | ICD-10-CM | POA: Diagnosis present

## 2023-04-13 LAB — CBC
HCT: 46.7 % (ref 39.0–52.0)
Hemoglobin: 15.9 g/dL (ref 13.0–17.0)
MCH: 30.3 pg (ref 26.0–34.0)
MCHC: 34 g/dL (ref 30.0–36.0)
MCV: 89 fL (ref 80.0–100.0)
Platelets: 198 10*3/uL (ref 150–400)
RBC: 5.25 MIL/uL (ref 4.22–5.81)
RDW: 12.7 % (ref 11.5–15.5)
WBC: 9.2 10*3/uL (ref 4.0–10.5)
nRBC: 0 % (ref 0.0–0.2)

## 2023-04-13 LAB — CBG MONITORING, ED
Glucose-Capillary: 228 mg/dL — ABNORMAL HIGH (ref 70–99)
Glucose-Capillary: 106 mg/dL — ABNORMAL HIGH (ref 70–99)
Glucose-Capillary: 123 mg/dL — ABNORMAL HIGH (ref 70–99)
Glucose-Capillary: 143 mg/dL — ABNORMAL HIGH (ref 70–99)
Glucose-Capillary: 125 mg/dL — ABNORMAL HIGH (ref 70–99)

## 2023-04-13 LAB — APTT: aPTT: 28 s (ref 24–36)

## 2023-04-13 LAB — PROTIME-INR
INR: 1.1 (ref 0.8–1.2)
Prothrombin Time: 13.9 s (ref 11.4–15.2)

## 2023-04-13 LAB — BASIC METABOLIC PANEL
Anion gap: 14 (ref 5–15)
BUN: 14 mg/dL (ref 6–20)
CO2: 22 mmol/L (ref 22–32)
Calcium: 9.6 mg/dL (ref 8.9–10.3)
Chloride: 97 mmol/L — ABNORMAL LOW (ref 98–111)
Creatinine, Ser: 1.01 mg/dL (ref 0.61–1.24)
GFR, Estimated: >60 mL/min (ref >60)
Glucose, Bld: 190 mg/dL — ABNORMAL HIGH (ref 70–99)
Potassium: 3.4 mmol/L — ABNORMAL LOW (ref 3.5–5.1)
Sodium: 133 mmol/L — ABNORMAL LOW (ref 135–145)

## 2023-04-13 LAB — HIV ANTIBODY (ROUTINE TESTING W REFLEX): HIV Screen 4th Generation wRfx: NONREACTIVE

## 2023-04-13 MED ORDER — FUROSEMIDE 20 MG PO TABS
20.0000 mg | ORAL_TABLET | Freq: Every day | ORAL | Status: DC
  Administered 2023-04-13 – 2023-04-16 (×4): 20 mg via ORAL
  Filled 2023-04-13 (×4): qty 1

## 2023-04-13 MED ORDER — CARBIDOPA-LEVODOPA ER 50-200 MG PO TBCR
1.0000 | EXTENDED_RELEASE_TABLET | Freq: Every day | ORAL | Status: DC
  Administered 2023-04-13 – 2023-04-15 (×3): 1 via ORAL
  Filled 2023-04-13 (×3): qty 1

## 2023-04-13 MED ORDER — DULOXETINE HCL 30 MG PO CPEP
60.0000 mg | ORAL_CAPSULE | Freq: Every day | ORAL | Status: DC
  Administered 2023-04-13 – 2023-04-16 (×4): 60 mg via ORAL
  Filled 2023-04-13: qty 1
  Filled 2023-04-13: qty 2
  Filled 2023-04-13: qty 1
  Filled 2023-04-13: qty 2

## 2023-04-13 MED ORDER — LAMOTRIGINE 25 MG PO TABS
50.0000 mg | ORAL_TABLET | Freq: Two times a day (BID) | ORAL | Status: DC
  Administered 2023-04-13 – 2023-04-16 (×7): 50 mg via ORAL
  Filled 2023-04-13 (×7): qty 2

## 2023-04-13 MED ORDER — PHENYTOIN SODIUM EXTENDED 100 MG PO CAPS
100.0000 mg | ORAL_CAPSULE | Freq: Two times a day (BID) | ORAL | Status: DC
  Administered 2023-04-13 (×2): 100 mg via ORAL
  Filled 2023-04-13 (×2): qty 1

## 2023-04-13 MED ORDER — HYDROCHLOROTHIAZIDE 25 MG PO TABS
25.0000 mg | ORAL_TABLET | Freq: Every day | ORAL | Status: DC
  Administered 2023-04-13 – 2023-04-16 (×4): 25 mg via ORAL
  Filled 2023-04-13 (×4): qty 1

## 2023-04-13 MED ORDER — PREDNISONE 50 MG PO TABS
60.0000 mg | ORAL_TABLET | Freq: Every day | ORAL | Status: DC
  Administered 2023-04-13 – 2023-04-15 (×3): 60 mg via ORAL
  Filled 2023-04-13: qty 6
  Filled 2023-04-13: qty 3
  Filled 2023-04-13: qty 1

## 2023-04-13 MED ORDER — PREGABALIN 50 MG PO CAPS
100.0000 mg | ORAL_CAPSULE | Freq: Three times a day (TID) | ORAL | Status: DC
Start: 2023-04-13 — End: 2023-04-16
  Administered 2023-04-13 – 2023-04-16 (×10): 100 mg via ORAL
  Filled 2023-04-13 (×10): qty 2

## 2023-04-13 MED ORDER — LOSARTAN POTASSIUM 50 MG PO TABS
100.0000 mg | ORAL_TABLET | Freq: Every day | ORAL | Status: DC
  Administered 2023-04-13 – 2023-04-16 (×4): 100 mg via ORAL
  Filled 2023-04-13 (×4): qty 2

## 2023-04-13 MED ORDER — CARBIDOPA-LEVODOPA 25-100 MG PO TABS
2.0000 | ORAL_TABLET | ORAL | Status: DC
  Administered 2023-04-13 – 2023-04-16 (×19): 2 via ORAL
  Filled 2023-04-13 (×22): qty 2

## 2023-04-13 MED ORDER — TIZANIDINE HCL 4 MG PO TABS
4.0000 mg | ORAL_TABLET | Freq: Three times a day (TID) | ORAL | Status: DC | PRN
  Administered 2023-04-13 – 2023-04-15 (×4): 4 mg via ORAL
  Filled 2023-04-13 (×2): qty 2
  Filled 2023-04-13 (×2): qty 1

## 2023-04-13 MED ORDER — CLONAZEPAM 1 MG PO TABS: 1.0000 mg | ORAL_TABLET | Freq: Every evening | ORAL | Status: DC | PRN

## 2023-04-13 MED ORDER — METOPROLOL SUCCINATE ER 25 MG PO TB24
25.0000 mg | ORAL_TABLET | Freq: Every day | ORAL | Status: DC
Start: 2023-04-13 — End: 2023-04-16
  Administered 2023-04-13 – 2023-04-16 (×4): 25 mg via ORAL
  Filled 2023-04-13 (×4): qty 1

## 2023-04-13 MED ORDER — HYDROMORPHONE HCL 1 MG/ML IJ SOLN
1.0000 mg | INTRAMUSCULAR | Status: DC | PRN
  Administered 2023-04-13 – 2023-04-14 (×7): 1 mg via INTRAVENOUS
  Filled 2023-04-13 (×7): qty 1

## 2023-04-13 NOTE — Progress Notes (Signed)
 Progress Note   Patient: FUTURE YELDELL FMW:[id number] DOB: 09/08/1970 DOA: 04/12/2023     0 DOS: the patient was seen and examined on 04/13/2023   Brief hospital course: BASHEER MOLCHAN is a 53 y.o. male with medical history significant of  spinal cord injury, chronic neck and back pain, remote history of  lumbar decompressive surgery and cervical spine surgery, HTN, DM, asthma, depression with anxiety, seizure, PTSD, obesity, OSA not on CPAP, migraine, who presents with back pain.   MRI L spine showed - Right subarticular disc extrusion with superior migration and probable sequestration at L4-5. Findings superimposed on additional multifactorial degenerative changes at this level resultant severe canal with bilateral subarticular stenosis, with severe right and moderate to severe left L4 foraminal narrowing. Finding could contribute to right-sided radicular symptoms.  Patient is admitted to the hospitalist service for pain control, neurosurgery consultation.  Assessment and Plan: Spinal stenosis Lumbar radiculopathy Patient will be continued on IV Dilaudid  and Norco for pain control. Continue muscle relaxants. Patient is continued on prednisone  60 mg daily. Neurosurgery evaluation is appreciated.  Patient to go for hemilaminectomy, discectomy tomorrow. N.p.o. past midnight.  Asthma, mild intermittent- Continue bronchodilators. As needed Mucinex .  Diabetes mellitus type 2: Well-controlled, A1c 6.1. Continue sliding scale insulin  as per blood protocol.  Seizure disorder Continue home dose Lamictal , Dilantin . Patient follows neurology, is on Sinemet  for suspicion of parkinsonism.  Obesity with BMI 37.5: Diet, exercise and weight reduction advised.     Out of bed to chair. Incentive spirometry. Nursing supportive care. Fall, aspiration precautions. DVT prophylaxis   Code Status: Full Code  Subjective: Patient is seen and examined today morning.  He is sitting on the edge of  the bed. Has back pain, tingling numbness right lower extremity.   Physical Exam: Vitals:   04/13/23 0230 04/13/23 0315 04/13/23 0320 04/13/23 0752  BP: (!) 152/112 136/86 136/86 (!) 164/119  Pulse: (!) 123 (!) 113 (!) 113 (!) 110  Resp:   20 17  Temp:   98.1 F (36.7 C) 97.9 F (36.6 C)  TempSrc:    Oral  SpO2: 96% 94% 97% 97%  Weight:      Height:        General - Middle aged obese Caucasian male, in distress due to pain HEENT - PERRLA, EOMI, atraumatic head, non tender sinuses. Lung - Clear, basal rales, rhonchi, wheezes. Heart - S1, S2 heard, no murmurs, rubs, trace pedal edema. Abdomen - Soft, non tender, bowel sounds good Neuro - Alert, awake and oriented x 3, non focal exam. Skin - Warm and dry.  Data Reviewed:      Latest Ref Rng & Units 04/13/2023   12:05 AM 04/21/2017    4:05 PM 03/30/2017    6:13 AM  CBC  WBC 4.0 - 10.5 K/uL 9.2  7.8  11.6   Hemoglobin 13.0 - 17.0 g/dL 84.0  84.9  86.4   Hematocrit 39.0 - 52.0 % 46.7  43.7  39.2   Platelets 150 - 400 K/uL 198  172  163       Latest Ref Rng & Units 04/13/2023   12:05 AM 04/21/2017    4:05 PM 03/30/2017    6:13 AM  BMP  Glucose 70 - 99 mg/dL 809  887  97   BUN 6 - 20 mg/dL 14  13  16    Creatinine 0.61 - 1.24 mg/dL 8.98  9.06  9.22   Sodium 135 - 145 mmol/L 133  138  138   Potassium 3.5 - 5.1 mmol/L 3.4  3.7  3.5   Chloride 98 - 111 mmol/L 97  101  105   CO2 22 - 32 mmol/L 22  28  26    Calcium 8.9 - 10.3 mg/dL 9.6  9.4  8.5    MR Lumbar Spine Wo Contrast Result Date: 04/12/2023 CLINICAL DATA:  Initial evaluation for acute right lower back pain with radiation into the right lower extremity. EXAM: MRI LUMBAR SPINE WITHOUT CONTRAST TECHNIQUE: Multiplanar, multisequence MR imaging of the lumbar spine was performed. No intravenous contrast was administered. COMPARISON:  Prior MRI from 04/28/2021. FINDINGS: Segmentation: Standard. Lowest well-formed disc space labeled the L5-S1 level. Alignment: Trace degenerative  retrolisthesis of L2 on L3 and L3 on L4. Alignment otherwise normal preservation of the normal lumbar lordosis. Vertebrae: Vertebral body height maintained without acute or chronic fracture. Bone marrow signal intensity within normal limits. No worrisome osseous lesions. Degenerative reactive endplate changes present about the L3-4 and L4-5 interspaces. Conus medullaris and cauda equina: Conus extends to the L1 level. Conus medullaris within normal limits. Scattered irregularity of the nerve roots of the cauda equina related to distal stenosis noted. Paraspinal and other soft tissues: Paraspinous soft tissues within normal limits. Few scattered T2 hyperintense simple renal cysts noted, benign in appearance, no follow-up imaging recommended. Disc levels: A degree of underlying congenital spinal stenosis noted. L1-2: Normal interspace. Mild facet hypertrophy. No canal or foraminal stenosis. L2-3: Diffuse disc bulge with disc desiccation and intervertebral disc space narrowing. Reactive endplate spurring. Mild to moderate facet hypertrophy. Mild prominence of the dorsal epidural fat. Resultant severe spinal stenosis. Mild to moderate bilateral L2 foraminal narrowing. L3-4: Diffuse disc bulge with disc desiccation. Reactive endplate spurring. Superimposed tiny central disc extrusion with inferior migration. Moderate facet hypertrophy. Prominence of the dorsal epidural fat. Resultant severe spinal stenosis. Mild to moderate left worse than right L3 foraminal stenosis. L4-5: Diffuse disc bulge with disc desiccation. Reactive endplate spurring. Superimposed right subarticular disc extrusion with superior migration and probable sequestration (series 5, image 10). Moderate bilateral facet hypertrophy. Prominence of the dorsal epidural fat. Resultant severe canal with bilateral subarticular stenosis. Severe right with moderate to severe left L4 foraminal narrowing. L5-S1: Diffuse disc bulge with disc desiccation. Reactive  endplate spurring. Superimposed central disc protrusion with annular fissure indents the ventral thecal sac. Prior left hemi laminectomy. Moderate left worse than right facet hypertrophy. No significant spinal stenosis. Moderate left worse than right L5 foraminal narrowing. IMPRESSION: 1. Right subarticular disc extrusion with superior migration and probable sequestration at L4-5. Findings superimposed on additional multifactorial degenerative changes at this level resultant severe canal with bilateral subarticular stenosis, with severe right and moderate to severe left L4 foraminal narrowing. Finding could contribute to right-sided radicular symptoms. 2. Additional multilevel lumbar spondylosis with resultant severe spinal stenosis at L2-3, L3-4, and L4-5. 3. Multifactorial degenerative changes with resultant multilevel foraminal narrowing as above. Notable findings include mild to moderate bilateral L2 and L3 foraminal stenosis, with moderate left worse than right L5 foraminal narrowing. Electronically Signed   By: Morene Hoard M.D.   On: 04/12/2023 21:09     Family Communication: Discussed with patient, he understand and agree. All questions answereed.    Disposition: Status is: Observation The patient will require care spanning > 2 midnights and should be moved to inpatient because: Neurosurgery  Planned Discharge Destination: Home     Time spent: 37 minutes  Author: Concepcion Riser, MD 04/13/2023 10:02 AM Secure chat 7am  to 7pm For on call review [email or web address].

## 2023-04-13 NOTE — Consult Note (Signed)
 Consulting Department:  ED  Primary Physician:  Diedra Lame, MD  Chief Complaint: Right-sided radiculopathy  History of Present Illness: 04/13/2023 Thomas Cole is a 53 y.o. male who presents with the chief complaint of new right-sided radiculopathy.  He has a history of spinal cord injury as well as chronic back neck and occipital pain.  He states that he has been worsening pain control in his right lower extremity over the past few weeks but over the past few days has noticed progressive weakness.  He is having difficulty with ambulation.  Feels like his leg is giving out.  He has noticed weakness in his ankle and his knee.  He has also developed loss of sensation in his right lower extremity distal to the knee.  He is not having any symptoms of cauda equina syndrome.  No bowel or bladder incontinence.  The symptoms are causing a significant impact on the patient's life.   Review of Systems:  A 10 point review of systems is negative, except for the pertinent positives and negatives detailed in the HPI.  Past Medical History: Past Medical History:  Diagnosis Date   Anxiety    Asthma    Back problem    Benign essential hypertension 11/30/2016   Cervical spondylosis without myelopathy 09/17/2012   Complication of anesthesia    occ takes longer to wake   Depression    Diabetes mellitus without complication (HCC) 02/2020   Type 2   GERD (gastroesophageal reflux disease)    Hypertension    Labile hypertension 03/19/2013   No current neuro changes, headache much improved. No clear sign of intracranial bleed. NO indication for head CT>  BP much better now... Fluctuates a lot.  Will eval for pheochromocytoma. As recommended at last cardiology OV.. Will prescribe hydralazine  to use as needed for BP fluctuations.  Close follow up with PCP.    Major depressive disorder, recurrent episode, severe (HCC) 06/23/2015   Migraine headache    Migraines 11/11/2013   S/P appendectomy 04/09/2011    Seizure (HCC) 11/30/2016   Seizures (HCC)    last 87   Shortness of breath    SOB (shortness of breath) 08/06/2014   - [facility] eval with cpst 02/20/2008 exercise testing demonstrateed normal functional capabilities and a normal cardiopulmonary response to exercise. - 09/16/2014  Walked RA x 3 laps @ 185 ft each stopped due to end of study/ nl pace/ no desat  - spirometry 09/16/2014 > no obst/ min restriction       Past Surgical History: Past Surgical History:  Procedure Laterality Date   ANTERIOR CERVICAL CORPECTOMY N/A 09/15/2012   Procedure: Cervical Three-Four,Cervical Six-Seven Anterior cervical decompression/diskectomy fusion with Cervical Four Corpectomy;  Surgeon: Victory Gens, MD;  Location: [facility];  Service: Neurosurgery;  Laterality: N/A;  Cervical Three-Four,Cervical Six-Seven  Anterior cervical decompression/diskectomy fusion with Cervical four Corpectomy   APPENDECTOMY  12   CERVICAL DISC SURGERY     CHOLECYSTECTOMY N/A 03/29/2017   Procedure: LAPAROSCOPIC CHOLECYSTECTOMY;  Surgeon: Wonda Charlie BRAVO, MD;  Location: [facility];  Service: General;  Laterality: N/A;   COLONOSCOPY WITH PROPOFOL  N/A 10/23/2019   Procedure: COLONOSCOPY WITH PROPOFOL ;  Surgeon: Dessa Reyes ORN, MD;  Location: [facility];  Service: Endoscopy;  Laterality: N/A;   ESOPHAGOGASTRODUODENOSCOPY (EGD) WITH PROPOFOL  N/A 10/23/2019   Procedure: ESOPHAGOGASTRODUODENOSCOPY (EGD) WITH PROPOFOL ;  Surgeon: Dessa Reyes ORN, MD;  Location: [facility];  Service: Endoscopy;  Laterality: N/A;   KNEE SURGERY     bilateral  POSTERIOR FUSION CERVICAL SPINE      Allergies: Allergies as of 04/12/2023 - Review Complete 04/12/2023  Allergen Reaction Noted   Amitriptyline Other (See Comments) 06/16/2015   Bee venom Itching and Swelling 04/09/2011   Chlorhexidine Itching 09/03/2012   Carbamazepine Itching and Rash 03/23/2010   Meloxicam Itching and Rash 09/03/2012   Morphine  and codeine Anxiety 03/29/2017    Sulfa antibiotics Itching, Rash, and Other (See Comments) 04/09/2011    Medications:  Current Facility-Administered Medications:    acetaminophen  (TYLENOL ) tablet 650 mg, 650 mg, Oral, Q6H PRN, Niu, Xilin, MD   albuterol  (PROVENTIL ) (2.5 MG/3ML) 0.083% nebulizer solution 2.5 mg, 2.5 mg, Nebulization, Q4H PRN, Belue, Rankin RAMAN, RPH   carbidopa -levodopa  (SINEMET  CR) 50-200 MG per tablet controlled release 1 tablet, 1 tablet, Oral, QHS, Niu, Xilin, MD   carbidopa -levodopa  (SINEMET  IR) 25-100 MG per tablet immediate release 2 tablet, 2 tablet, Oral, 6 times per day, Niu, Xilin, MD   clonazePAM  (KLONOPIN ) tablet 1 mg, 1 mg, Oral, QHS PRN, Niu, Xilin, MD   dextromethorphan-guaiFENesin  (MUCINEX  DM) 30-600 MG per 12 hr tablet 1 tablet, 1 tablet, Oral, BID PRN, Niu, Xilin, MD   DULoxetine  (CYMBALTA ) DR capsule 60 mg, 60 mg, Oral, Daily, Niu, Xilin, MD   furosemide  (LASIX ) tablet 20 mg, 20 mg, Oral, Daily, Niu, Xilin, MD   heparin  injection 5,000 Units, 5,000 Units, Subcutaneous, Q8H, Niu, Xilin, MD, 5,000 Units at 04/13/23 0058   hydrALAZINE  (APRESOLINE ) injection 5 mg, 5 mg, Intravenous, Q2H PRN, Niu, Xilin, MD   hydrochlorothiazide  (HYDRODIURIL ) tablet 25 mg, 25 mg, Oral, Daily, Niu, Xilin, MD   HYDROcodone -acetaminophen  (NORCO) 10-325 MG per tablet 1 tablet, 1 tablet, Oral, Q4H PRN, Niu, Xilin, MD, 1 tablet at 04/13/23 9676   HYDROmorphone  (DILAUDID ) injection 1 mg, 1 mg, Intravenous, Q4H PRN, Sreeram, Narendranath, MD, 1 mg at 04/13/23 9175   insulin  aspart (novoLOG ) injection 0-5 Units, 0-5 Units, Subcutaneous, QHS, Niu, Xilin, MD, 2 Units at 04/13/23 0107   insulin  aspart (novoLOG ) injection 0-9 Units, 0-9 Units, Subcutaneous, TID WC, Niu, Xilin, MD   lamoTRIgine  (LAMICTAL ) tablet 50 mg, 50 mg, Oral, BID, Niu, Xilin, MD   lidocaine  (LIDODERM ) 5 % 1 patch, 1 patch, Transdermal, Q24H, Niu, Xilin, MD, 1 patch at 04/13/23 0057   LORazepam  (ATIVAN ) injection 2 mg, 2 mg, Intravenous, Q2H PRN, Niu,  Xilin, MD, 2 mg at 04/13/23 9676   losartan  (COZAAR ) tablet 100 mg, 100 mg, Oral, Daily, Niu, Xilin, MD   metoprolol  succinate (TOPROL -XL) 24 hr tablet 25 mg, 25 mg, Oral, Daily, Niu, Xilin, MD   ondansetron  (ZOFRAN ) injection 4 mg, 4 mg, Intravenous, Q8H PRN, Niu, Xilin, MD   phenytoin  (DILANTIN ) ER capsule 100 mg, 100 mg, Oral, BID, Niu, Xilin, MD   predniSONE  (DELTASONE ) tablet 60 mg, 60 mg, Oral, Q breakfast, Niu, Xilin, MD   pregabalin  (LYRICA ) capsule 100 mg, 100 mg, Oral, TID, Niu, Xilin, MD   tiZANidine  (ZANAFLEX ) tablet 4 mg, 4 mg, Oral, Q8H PRN, Niu, Xilin, MD, 4 mg at 04/13/23 9240  Current Outpatient Medications:    carbidopa -levodopa  (SINEMET  CR) 50-200 MG tablet, Take 1 tablet by mouth at bedtime., Disp: , Rfl:    carbidopa -levodopa  (SINEMET  IR) 25-100 MG tablet, TAKE 2 TABLETS BY MOUTH 3 (THREE) TIMES DAILY, Disp: , Rfl:    Cholecalciferol  (VITAMIN D3) 25 MCG (1000 UT) CAPS, Take 1 capsule by mouth daily., Disp: , Rfl:    clonazePAM  (KLONOPIN ) 1 MG tablet, Take 1 mg by mouth at bedtime as needed., Disp: ,  Rfl:    DULoxetine  (CYMBALTA ) 60 MG capsule, Take 1 capsule (60 mg total) by mouth daily after breakfast., Disp: 30 capsule, Rfl: 5   furosemide  (LASIX ) 20 MG tablet, Take 20 mg by mouth daily., Disp: , Rfl:    hydrochlorothiazide  (HYDRODIURIL ) 25 MG tablet, Take 25 mg by mouth daily., Disp: , Rfl:    HYDROcodone -acetaminophen  (NORCO) 10-325 MG tablet, Take 1 tablet by mouth every 8 (eight) hours as needed. For chronic pain syndrome. Each Rx to last 30 days., Disp: 90 tablet, Rfl: 0   lamoTRIgine  (LAMICTAL ) 25 MG tablet, Take 25 mg by mouth at bedtime. Titration schedule, Disp: , Rfl:    losartan  (COZAAR ) 100 MG tablet, Take 100 mg by mouth daily., Disp: , Rfl:    metFORMIN (GLUCOPHAGE) 500 MG tablet, Take 500 mg by mouth daily after lunch., Disp: , Rfl:    metoprolol  succinate (TOPROL -XL) 25 MG 24 hr tablet, Take 12.5 mg by mouth daily., Disp: , Rfl:    omeprazole  (PRILOSEC) 40  MG capsule, Take 1 capsule by mouth 2 (two) times daily., Disp: , Rfl:    phenytoin  (DILANTIN ) 100 MG ER capsule, Take 100 mg by mouth. 2 in a.m. 3 at night, Disp: , Rfl:    pregabalin  (LYRICA ) 100 MG capsule, Take 1 capsule (100 mg total) by mouth 3 (three) times daily., Disp: 90 capsule, Rfl: 5   sildenafil (VIAGRA) 100 MG tablet, 1/2 to 1 tab daily as needed prior to sex, Disp: , Rfl:    tiZANidine  (ZANAFLEX ) 4 MG tablet, Take 4 mg by mouth 3 (three) times daily., Disp: , Rfl:    vitamin B-12 (CYANOCOBALAMIN ) 1000 MCG tablet, Take 1,000 mcg by mouth daily., Disp: , Rfl:    HYDROcodone -acetaminophen  (NORCO) 10-325 MG tablet, Take 1 tablet by mouth every 8 (eight) hours as needed. For chronic pain syndrome. Each Rx to last 30 days., Disp: 90 tablet, Rfl: 0   HYDROcodone -acetaminophen  (NORCO) 10-325 MG tablet, Take 1 tablet by mouth every 8 (eight) hours as needed. For chronic pain syndrome. Each Rx to last 30 days., Disp: 90 tablet, Rfl: 0   [START ON 04/15/2023] HYDROcodone -acetaminophen  (NORCO) 10-325 MG tablet, Take 1 tablet by mouth every 8 (eight) hours as needed. For chronic pain syndrome. Each Rx to last 30 days., Disp: 90 tablet, Rfl: 0   nortriptyline  (PAMELOR ) 25 MG capsule, Take 2 capsules (50 mg total) by mouth at bedtime., Disp: 60 capsule, Rfl: 5   Social History: Social History   Tobacco Use   Smoking status: Never   Smokeless tobacco: Never  Vaping Use   Vaping status: Never Used  Substance Use Topics   Alcohol use: No    Alcohol/week: 0.0 standard drinks of alcohol   Drug use: No    Family Medical History: Family History  Problem Relation Age of Onset   Coronary artery disease Mother    Hypertension Mother    Mental illness Mother    Arthritis Mother    Asthma Mother    Diabetes Father    Coronary artery disease Father    Heart disease Father    Arthritis Father    Arthritis Maternal Grandmother    Heart disease Maternal Grandmother    Hypertension Maternal  Grandmother    Arthritis Maternal Grandfather    Heart disease Maternal Grandfather    Hypertension Maternal Grandfather    Diabetes Paternal Grandmother    Arthritis Paternal Grandmother    Heart disease Paternal Grandmother    Hypertension Paternal Grandmother  Diabetes Paternal Grandfather    Arthritis Paternal Grandfather    Heart disease Paternal Grandfather    Hypertension Paternal Grandfather    Cancer Neg Hx    Stroke Neg Hx     Physical Examination: Vitals:   04/13/23 0320 04/13/23 0752  BP: 136/86 (!) 164/119  Pulse: (!) 113 (!) 110  Resp: 20 17  Temp: 98.1 F (36.7 C) 97.9 F (36.6 C)  SpO2: 97% 97%     General: Patient is well developed, well nourished, calm, collected, and in no apparent distress.  NEUROLOGICAL:  General: When resting pain seems to be well-controlled, however with movements of his back or right lower extremity gets significant radicular pain Awake, alert, oriented to person, place, and time.  Pupils equal round and reactive to light.  Facial tone is symmetric.  Tongue protrusion is midline.  There is no pronator drift.    Strength:  Side Iliopsoas Quads Hamstring PF DF EHL  R 4+ 4 5 4 4  4-  L 5 5 5 5 5 5     Bilateral upper and lower extremity sensation is intact to light touch.  Hyperreflexic in the bilateral lower extremities with the exception of the right patellar tendon which is significantly diminished when compared to the contralateral side.  Imaging: MR Lumbar Spine Wo Contrast Result Date: 04/12/2023 CLINICAL DATA:  Initial evaluation for acute right lower back pain with radiation into the right lower extremity. EXAM: MRI LUMBAR SPINE WITHOUT CONTRAST TECHNIQUE: Multiplanar, multisequence MR imaging of the lumbar spine was performed. No intravenous contrast was administered. COMPARISON:  Prior MRI from 04/28/2021. FINDINGS: Segmentation: Standard. Lowest well-formed disc space labeled the L5-S1 level. Alignment: Trace  degenerative retrolisthesis of L2 on L3 and L3 on L4. Alignment otherwise normal preservation of the normal lumbar lordosis. Vertebrae: Vertebral body height maintained without acute or chronic fracture. Bone marrow signal intensity within normal limits. No worrisome osseous lesions. Degenerative reactive endplate changes present about the L3-4 and L4-5 interspaces. Conus medullaris and cauda equina: Conus extends to the L1 level. Conus medullaris within normal limits. Scattered irregularity of the nerve roots of the cauda equina related to distal stenosis noted. Paraspinal and other soft tissues: Paraspinous soft tissues within normal limits. Few scattered T2 hyperintense simple renal cysts noted, benign in appearance, no follow-up imaging recommended. Disc levels: A degree of underlying congenital spinal stenosis noted. L1-2: Normal interspace. Mild facet hypertrophy. No canal or foraminal stenosis. L2-3: Diffuse disc bulge with disc desiccation and intervertebral disc space narrowing. Reactive endplate spurring. Mild to moderate facet hypertrophy. Mild prominence of the dorsal epidural fat. Resultant severe spinal stenosis. Mild to moderate bilateral L2 foraminal narrowing. L3-4: Diffuse disc bulge with disc desiccation. Reactive endplate spurring. Superimposed tiny central disc extrusion with inferior migration. Moderate facet hypertrophy. Prominence of the dorsal epidural fat. Resultant severe spinal stenosis. Mild to moderate left worse than right L3 foraminal stenosis. L4-5: Diffuse disc bulge with disc desiccation. Reactive endplate spurring. Superimposed right subarticular disc extrusion with superior migration and probable sequestration (series 5, image 10). Moderate bilateral facet hypertrophy. Prominence of the dorsal epidural fat. Resultant severe canal with bilateral subarticular stenosis. Severe right with moderate to severe left L4 foraminal narrowing. L5-S1: Diffuse disc bulge with disc desiccation.  Reactive endplate spurring. Superimposed central disc protrusion with annular fissure indents the ventral thecal sac. Prior left hemi laminectomy. Moderate left worse than right facet hypertrophy. No significant spinal stenosis. Moderate left worse than right L5 foraminal narrowing. IMPRESSION: 1. Right subarticular disc extrusion  with superior migration and probable sequestration at L4-5. Findings superimposed on additional multifactorial degenerative changes at this level resultant severe canal with bilateral subarticular stenosis, with severe right and moderate to severe left L4 foraminal narrowing. Finding could contribute to right-sided radicular symptoms. 2. Additional multilevel lumbar spondylosis with resultant severe spinal stenosis at L2-3, L3-4, and L4-5. 3. Multifactorial degenerative changes with resultant multilevel foraminal narrowing as above. Notable findings include mild to moderate bilateral L2 and L3 foraminal stenosis, with moderate left worse than right L5 foraminal narrowing. Electronically Signed   By: Morene Hoard M.D.   On: 04/12/2023 21:09     I have personally reviewed the images and agree with the above interpretation.  Labs:    Latest Ref Rng & Units 04/13/2023   12:05 AM 04/21/2017    4:05 PM 03/30/2017    6:13 AM  CBC  WBC 4.0 - 10.5 K/uL 9.2  7.8  11.6   Hemoglobin 13.0 - 17.0 g/dL 84.0  84.9  86.4   Hematocrit 39.0 - 52.0 % 46.7  43.7  39.2   Platelets 150 - 400 K/uL 198  172  163       Latest Ref Rng & Units 04/13/2023   12:05 AM 04/21/2017    4:05 PM 03/30/2017    6:13 AM  BMP  Glucose 70 - 99 mg/dL 809  887  97   BUN 6 - 20 mg/dL 14  13  16    Creatinine 0.61 - 1.24 mg/dL 8.98  9.06  9.22   Sodium 135 - 145 mmol/L 133  138  138   Potassium 3.5 - 5.1 mmol/L 3.4  3.7  3.5   Chloride 98 - 111 mmol/L 97  101  105   CO2 22 - 32 mmol/L 22  28  26    Calcium 8.9 - 10.3 mg/dL 9.6  9.4  8.5     INR  1.1 (01/11 0005)   Assessment and Plan: Mr. Hevener  is a pleasant 53 y.o. male with history of chronic neck and back and occipital pain.  He comes in for an exacerbation of right lower extremity radiculopathy.  He is not having any bowel or bladder incontinence, however he does notice progressive weakness in his right lower extremity.  His partner states that she has noticed that he had started limping on that side over the past month, however now he is unable to bear his own weight and has noticed weakness in his ankle and his knee.  On physical examination he has a decreased right sided patellar reflex as well as decreased knee extension and dorsiflexion with some weakness in toe extension.  He has positive straight leg raise.  Decreased sensation in the L4 and L5 dermatome on the right.  MRI demonstrates a sequestered disc at L4-5 contacting the L4 nerve root.  This correlates well with his symptomatology.  Given his progressive weakness and difficulty with ambulation necessitating inpatient admission we have offered a hemilaminectomy discectomy.  He would like to wait until his family is here for surgery so we are planning on performing the surgery on Sunday, 04/14/2023.  This plan was discussed with both him and his partner.   Penne MICAEL Sharps, MD/MSCR Dept. of Neurosurgery

## 2023-04-13 NOTE — H&P (View-Only) (Signed)
 Consulting Department:  ED  Primary Physician:  Diedra Lame, MD  Chief Complaint: Right-sided radiculopathy  History of Present Illness: 04/13/2023 Thomas Cole is a 53 y.o. male who presents with the chief complaint of new right-sided radiculopathy.  He has a history of spinal cord injury as well as chronic back neck and occipital pain.  He states that he has been worsening pain control in his right lower extremity over the past few weeks but over the past few days has noticed progressive weakness.  He is having difficulty with ambulation.  Feels like his leg is giving out.  He has noticed weakness in his ankle and his knee.  He has also developed loss of sensation in his right lower extremity distal to the knee.  He is not having any symptoms of cauda equina syndrome.  No bowel or bladder incontinence.  The symptoms are causing a significant impact on the patient's life.   Review of Systems:  A 10 point review of systems is negative, except for the pertinent positives and negatives detailed in the HPI.  Past Medical History: Past Medical History:  Diagnosis Date   Anxiety    Asthma    Back problem    Benign essential hypertension 11/30/2016   Cervical spondylosis without myelopathy 09/17/2012   Complication of anesthesia    occ takes longer to wake   Depression    Diabetes mellitus without complication (HCC) 02/2020   Type 2   GERD (gastroesophageal reflux disease)    Hypertension    Labile hypertension 03/19/2013   No current neuro changes, headache much improved. No clear sign of intracranial bleed. NO indication for head CT>  BP much better now... Fluctuates a lot.  Will eval for pheochromocytoma. As recommended at last cardiology OV.. Will prescribe hydralazine  to use as needed for BP fluctuations.  Close follow up with PCP.    Major depressive disorder, recurrent episode, severe (HCC) 06/23/2015   Migraine headache    Migraines 11/11/2013   S/P appendectomy 04/09/2011    Seizure (HCC) 11/30/2016   Seizures (HCC)    last 87   Shortness of breath    SOB (shortness of breath) 08/06/2014   - [facility] eval with cpst 02/20/2008 exercise testing demonstrateed normal functional capabilities and a normal cardiopulmonary response to exercise. - 09/16/2014  Walked RA x 3 laps @ 185 ft each stopped due to end of study/ nl pace/ no desat  - spirometry 09/16/2014 > no obst/ min restriction       Past Surgical History: Past Surgical History:  Procedure Laterality Date   ANTERIOR CERVICAL CORPECTOMY N/A 09/15/2012   Procedure: Cervical Three-Four,Cervical Six-Seven Anterior cervical decompression/diskectomy fusion with Cervical Four Corpectomy;  Surgeon: Victory Gens, MD;  Location: [facility];  Service: Neurosurgery;  Laterality: N/A;  Cervical Three-Four,Cervical Six-Seven  Anterior cervical decompression/diskectomy fusion with Cervical four Corpectomy   APPENDECTOMY  12   CERVICAL DISC SURGERY     CHOLECYSTECTOMY N/A 03/29/2017   Procedure: LAPAROSCOPIC CHOLECYSTECTOMY;  Surgeon: Wonda Charlie BRAVO, MD;  Location: [facility];  Service: General;  Laterality: N/A;   COLONOSCOPY WITH PROPOFOL  N/A 10/23/2019   Procedure: COLONOSCOPY WITH PROPOFOL ;  Surgeon: Dessa Reyes ORN, MD;  Location: [facility];  Service: Endoscopy;  Laterality: N/A;   ESOPHAGOGASTRODUODENOSCOPY (EGD) WITH PROPOFOL  N/A 10/23/2019   Procedure: ESOPHAGOGASTRODUODENOSCOPY (EGD) WITH PROPOFOL ;  Surgeon: Dessa Reyes ORN, MD;  Location: [facility];  Service: Endoscopy;  Laterality: N/A;   KNEE SURGERY     bilateral  POSTERIOR FUSION CERVICAL SPINE      Allergies: Allergies as of 04/12/2023 - Review Complete 04/12/2023  Allergen Reaction Noted   Amitriptyline Other (See Comments) 06/16/2015   Bee venom Itching and Swelling 04/09/2011   Chlorhexidine Itching 09/03/2012   Carbamazepine Itching and Rash 03/23/2010   Meloxicam Itching and Rash 09/03/2012   Morphine  and codeine Anxiety 03/29/2017    Sulfa antibiotics Itching, Rash, and Other (See Comments) 04/09/2011    Medications:  Current Facility-Administered Medications:    acetaminophen  (TYLENOL ) tablet 650 mg, 650 mg, Oral, Q6H PRN, Niu, Xilin, MD   albuterol  (PROVENTIL ) (2.5 MG/3ML) 0.083% nebulizer solution 2.5 mg, 2.5 mg, Nebulization, Q4H PRN, Belue, Rankin RAMAN, RPH   carbidopa -levodopa  (SINEMET  CR) 50-200 MG per tablet controlled release 1 tablet, 1 tablet, Oral, QHS, Niu, Xilin, MD   carbidopa -levodopa  (SINEMET  IR) 25-100 MG per tablet immediate release 2 tablet, 2 tablet, Oral, 6 times per day, Niu, Xilin, MD   clonazePAM  (KLONOPIN ) tablet 1 mg, 1 mg, Oral, QHS PRN, Niu, Xilin, MD   dextromethorphan-guaiFENesin  (MUCINEX  DM) 30-600 MG per 12 hr tablet 1 tablet, 1 tablet, Oral, BID PRN, Niu, Xilin, MD   DULoxetine  (CYMBALTA ) DR capsule 60 mg, 60 mg, Oral, Daily, Niu, Xilin, MD   furosemide  (LASIX ) tablet 20 mg, 20 mg, Oral, Daily, Niu, Xilin, MD   heparin  injection 5,000 Units, 5,000 Units, Subcutaneous, Q8H, Niu, Xilin, MD, 5,000 Units at 04/13/23 0058   hydrALAZINE  (APRESOLINE ) injection 5 mg, 5 mg, Intravenous, Q2H PRN, Niu, Xilin, MD   hydrochlorothiazide  (HYDRODIURIL ) tablet 25 mg, 25 mg, Oral, Daily, Niu, Xilin, MD   HYDROcodone -acetaminophen  (NORCO) 10-325 MG per tablet 1 tablet, 1 tablet, Oral, Q4H PRN, Niu, Xilin, MD, 1 tablet at 04/13/23 9676   HYDROmorphone  (DILAUDID ) injection 1 mg, 1 mg, Intravenous, Q4H PRN, Sreeram, Narendranath, MD, 1 mg at 04/13/23 9175   insulin  aspart (novoLOG ) injection 0-5 Units, 0-5 Units, Subcutaneous, QHS, Niu, Xilin, MD, 2 Units at 04/13/23 0107   insulin  aspart (novoLOG ) injection 0-9 Units, 0-9 Units, Subcutaneous, TID WC, Niu, Xilin, MD   lamoTRIgine  (LAMICTAL ) tablet 50 mg, 50 mg, Oral, BID, Niu, Xilin, MD   lidocaine  (LIDODERM ) 5 % 1 patch, 1 patch, Transdermal, Q24H, Niu, Xilin, MD, 1 patch at 04/13/23 0057   LORazepam  (ATIVAN ) injection 2 mg, 2 mg, Intravenous, Q2H PRN, Niu,  Xilin, MD, 2 mg at 04/13/23 9676   losartan  (COZAAR ) tablet 100 mg, 100 mg, Oral, Daily, Niu, Xilin, MD   metoprolol  succinate (TOPROL -XL) 24 hr tablet 25 mg, 25 mg, Oral, Daily, Niu, Xilin, MD   ondansetron  (ZOFRAN ) injection 4 mg, 4 mg, Intravenous, Q8H PRN, Niu, Xilin, MD   phenytoin  (DILANTIN ) ER capsule 100 mg, 100 mg, Oral, BID, Niu, Xilin, MD   predniSONE  (DELTASONE ) tablet 60 mg, 60 mg, Oral, Q breakfast, Niu, Xilin, MD   pregabalin  (LYRICA ) capsule 100 mg, 100 mg, Oral, TID, Niu, Xilin, MD   tiZANidine  (ZANAFLEX ) tablet 4 mg, 4 mg, Oral, Q8H PRN, Niu, Xilin, MD, 4 mg at 04/13/23 9240  Current Outpatient Medications:    carbidopa -levodopa  (SINEMET  CR) 50-200 MG tablet, Take 1 tablet by mouth at bedtime., Disp: , Rfl:    carbidopa -levodopa  (SINEMET  IR) 25-100 MG tablet, TAKE 2 TABLETS BY MOUTH 3 (THREE) TIMES DAILY, Disp: , Rfl:    Cholecalciferol  (VITAMIN D3) 25 MCG (1000 UT) CAPS, Take 1 capsule by mouth daily., Disp: , Rfl:    clonazePAM  (KLONOPIN ) 1 MG tablet, Take 1 mg by mouth at bedtime as needed., Disp: ,  Rfl:    DULoxetine  (CYMBALTA ) 60 MG capsule, Take 1 capsule (60 mg total) by mouth daily after breakfast., Disp: 30 capsule, Rfl: 5   furosemide  (LASIX ) 20 MG tablet, Take 20 mg by mouth daily., Disp: , Rfl:    hydrochlorothiazide  (HYDRODIURIL ) 25 MG tablet, Take 25 mg by mouth daily., Disp: , Rfl:    HYDROcodone -acetaminophen  (NORCO) 10-325 MG tablet, Take 1 tablet by mouth every 8 (eight) hours as needed. For chronic pain syndrome. Each Rx to last 30 days., Disp: 90 tablet, Rfl: 0   lamoTRIgine  (LAMICTAL ) 25 MG tablet, Take 25 mg by mouth at bedtime. Titration schedule, Disp: , Rfl:    losartan  (COZAAR ) 100 MG tablet, Take 100 mg by mouth daily., Disp: , Rfl:    metFORMIN (GLUCOPHAGE) 500 MG tablet, Take 500 mg by mouth daily after lunch., Disp: , Rfl:    metoprolol  succinate (TOPROL -XL) 25 MG 24 hr tablet, Take 12.5 mg by mouth daily., Disp: , Rfl:    omeprazole  (PRILOSEC) 40  MG capsule, Take 1 capsule by mouth 2 (two) times daily., Disp: , Rfl:    phenytoin  (DILANTIN ) 100 MG ER capsule, Take 100 mg by mouth. 2 in a.m. 3 at night, Disp: , Rfl:    pregabalin  (LYRICA ) 100 MG capsule, Take 1 capsule (100 mg total) by mouth 3 (three) times daily., Disp: 90 capsule, Rfl: 5   sildenafil (VIAGRA) 100 MG tablet, 1/2 to 1 tab daily as needed prior to sex, Disp: , Rfl:    tiZANidine  (ZANAFLEX ) 4 MG tablet, Take 4 mg by mouth 3 (three) times daily., Disp: , Rfl:    vitamin B-12 (CYANOCOBALAMIN ) 1000 MCG tablet, Take 1,000 mcg by mouth daily., Disp: , Rfl:    HYDROcodone -acetaminophen  (NORCO) 10-325 MG tablet, Take 1 tablet by mouth every 8 (eight) hours as needed. For chronic pain syndrome. Each Rx to last 30 days., Disp: 90 tablet, Rfl: 0   HYDROcodone -acetaminophen  (NORCO) 10-325 MG tablet, Take 1 tablet by mouth every 8 (eight) hours as needed. For chronic pain syndrome. Each Rx to last 30 days., Disp: 90 tablet, Rfl: 0   [START ON 04/15/2023] HYDROcodone -acetaminophen  (NORCO) 10-325 MG tablet, Take 1 tablet by mouth every 8 (eight) hours as needed. For chronic pain syndrome. Each Rx to last 30 days., Disp: 90 tablet, Rfl: 0   nortriptyline  (PAMELOR ) 25 MG capsule, Take 2 capsules (50 mg total) by mouth at bedtime., Disp: 60 capsule, Rfl: 5   Social History: Social History   Tobacco Use   Smoking status: Never   Smokeless tobacco: Never  Vaping Use   Vaping status: Never Used  Substance Use Topics   Alcohol use: No    Alcohol/week: 0.0 standard drinks of alcohol   Drug use: No    Family Medical History: Family History  Problem Relation Age of Onset   Coronary artery disease Mother    Hypertension Mother    Mental illness Mother    Arthritis Mother    Asthma Mother    Diabetes Father    Coronary artery disease Father    Heart disease Father    Arthritis Father    Arthritis Maternal Grandmother    Heart disease Maternal Grandmother    Hypertension Maternal  Grandmother    Arthritis Maternal Grandfather    Heart disease Maternal Grandfather    Hypertension Maternal Grandfather    Diabetes Paternal Grandmother    Arthritis Paternal Grandmother    Heart disease Paternal Grandmother    Hypertension Paternal Grandmother  Diabetes Paternal Grandfather    Arthritis Paternal Grandfather    Heart disease Paternal Grandfather    Hypertension Paternal Grandfather    Cancer Neg Hx    Stroke Neg Hx     Physical Examination: Vitals:   04/13/23 0320 04/13/23 0752  BP: 136/86 (!) 164/119  Pulse: (!) 113 (!) 110  Resp: 20 17  Temp: 98.1 F (36.7 C) 97.9 F (36.6 C)  SpO2: 97% 97%     General: Patient is well developed, well nourished, calm, collected, and in no apparent distress.  NEUROLOGICAL:  General: When resting pain seems to be well-controlled, however with movements of his back or right lower extremity gets significant radicular pain Awake, alert, oriented to person, place, and time.  Pupils equal round and reactive to light.  Facial tone is symmetric.  Tongue protrusion is midline.  There is no pronator drift.    Strength:  Side Iliopsoas Quads Hamstring PF DF EHL  R 4+ 4 5 4 4  4-  L 5 5 5 5 5 5     Bilateral upper and lower extremity sensation is intact to light touch.  Hyperreflexic in the bilateral lower extremities with the exception of the right patellar tendon which is significantly diminished when compared to the contralateral side.  Imaging: MR Lumbar Spine Wo Contrast Result Date: 04/12/2023 CLINICAL DATA:  Initial evaluation for acute right lower back pain with radiation into the right lower extremity. EXAM: MRI LUMBAR SPINE WITHOUT CONTRAST TECHNIQUE: Multiplanar, multisequence MR imaging of the lumbar spine was performed. No intravenous contrast was administered. COMPARISON:  Prior MRI from 04/28/2021. FINDINGS: Segmentation: Standard. Lowest well-formed disc space labeled the L5-S1 level. Alignment: Trace  degenerative retrolisthesis of L2 on L3 and L3 on L4. Alignment otherwise normal preservation of the normal lumbar lordosis. Vertebrae: Vertebral body height maintained without acute or chronic fracture. Bone marrow signal intensity within normal limits. No worrisome osseous lesions. Degenerative reactive endplate changes present about the L3-4 and L4-5 interspaces. Conus medullaris and cauda equina: Conus extends to the L1 level. Conus medullaris within normal limits. Scattered irregularity of the nerve roots of the cauda equina related to distal stenosis noted. Paraspinal and other soft tissues: Paraspinous soft tissues within normal limits. Few scattered T2 hyperintense simple renal cysts noted, benign in appearance, no follow-up imaging recommended. Disc levels: A degree of underlying congenital spinal stenosis noted. L1-2: Normal interspace. Mild facet hypertrophy. No canal or foraminal stenosis. L2-3: Diffuse disc bulge with disc desiccation and intervertebral disc space narrowing. Reactive endplate spurring. Mild to moderate facet hypertrophy. Mild prominence of the dorsal epidural fat. Resultant severe spinal stenosis. Mild to moderate bilateral L2 foraminal narrowing. L3-4: Diffuse disc bulge with disc desiccation. Reactive endplate spurring. Superimposed tiny central disc extrusion with inferior migration. Moderate facet hypertrophy. Prominence of the dorsal epidural fat. Resultant severe spinal stenosis. Mild to moderate left worse than right L3 foraminal stenosis. L4-5: Diffuse disc bulge with disc desiccation. Reactive endplate spurring. Superimposed right subarticular disc extrusion with superior migration and probable sequestration (series 5, image 10). Moderate bilateral facet hypertrophy. Prominence of the dorsal epidural fat. Resultant severe canal with bilateral subarticular stenosis. Severe right with moderate to severe left L4 foraminal narrowing. L5-S1: Diffuse disc bulge with disc desiccation.  Reactive endplate spurring. Superimposed central disc protrusion with annular fissure indents the ventral thecal sac. Prior left hemi laminectomy. Moderate left worse than right facet hypertrophy. No significant spinal stenosis. Moderate left worse than right L5 foraminal narrowing. IMPRESSION: 1. Right subarticular disc extrusion  with superior migration and probable sequestration at L4-5. Findings superimposed on additional multifactorial degenerative changes at this level resultant severe canal with bilateral subarticular stenosis, with severe right and moderate to severe left L4 foraminal narrowing. Finding could contribute to right-sided radicular symptoms. 2. Additional multilevel lumbar spondylosis with resultant severe spinal stenosis at L2-3, L3-4, and L4-5. 3. Multifactorial degenerative changes with resultant multilevel foraminal narrowing as above. Notable findings include mild to moderate bilateral L2 and L3 foraminal stenosis, with moderate left worse than right L5 foraminal narrowing. Electronically Signed   By: Morene Hoard M.D.   On: 04/12/2023 21:09     I have personally reviewed the images and agree with the above interpretation.  Labs:    Latest Ref Rng & Units 04/13/2023   12:05 AM 04/21/2017    4:05 PM 03/30/2017    6:13 AM  CBC  WBC 4.0 - 10.5 K/uL 9.2  7.8  11.6   Hemoglobin 13.0 - 17.0 g/dL 84.0  84.9  86.4   Hematocrit 39.0 - 52.0 % 46.7  43.7  39.2   Platelets 150 - 400 K/uL 198  172  163       Latest Ref Rng & Units 04/13/2023   12:05 AM 04/21/2017    4:05 PM 03/30/2017    6:13 AM  BMP  Glucose 70 - 99 mg/dL 809  887  97   BUN 6 - 20 mg/dL 14  13  16    Creatinine 0.61 - 1.24 mg/dL 8.98  9.06  9.22   Sodium 135 - 145 mmol/L 133  138  138   Potassium 3.5 - 5.1 mmol/L 3.4  3.7  3.5   Chloride 98 - 111 mmol/L 97  101  105   CO2 22 - 32 mmol/L 22  28  26    Calcium 8.9 - 10.3 mg/dL 9.6  9.4  8.5     INR  1.1 (01/11 0005)   Assessment and Plan: Mr. Hevener  is a pleasant 53 y.o. male with history of chronic neck and back and occipital pain.  He comes in for an exacerbation of right lower extremity radiculopathy.  He is not having any bowel or bladder incontinence, however he does notice progressive weakness in his right lower extremity.  His partner states that she has noticed that he had started limping on that side over the past month, however now he is unable to bear his own weight and has noticed weakness in his ankle and his knee.  On physical examination he has a decreased right sided patellar reflex as well as decreased knee extension and dorsiflexion with some weakness in toe extension.  He has positive straight leg raise.  Decreased sensation in the L4 and L5 dermatome on the right.  MRI demonstrates a sequestered disc at L4-5 contacting the L4 nerve root.  This correlates well with his symptomatology.  Given his progressive weakness and difficulty with ambulation necessitating inpatient admission we have offered a hemilaminectomy discectomy.  He would like to wait until his family is here for surgery so we are planning on performing the surgery on Sunday, 04/14/2023.  This plan was discussed with both him and his partner.   Penne MICAEL Sharps, MD/MSCR Dept. of Neurosurgery

## 2023-04-14 ENCOUNTER — Inpatient Hospital Stay: Payer: Self-pay | Admitting: Anesthesiology

## 2023-04-14 ENCOUNTER — Encounter
Admission: EM | Disposition: A | Discharge status: Home Health | Payer: Self-pay | Source: Home / Self Care | Attending: Internal Medicine

## 2023-04-14 ENCOUNTER — Other Ambulatory Visit: Payer: Self-pay

## 2023-04-14 ENCOUNTER — Inpatient Hospital Stay: Payer: Medicare Other

## 2023-04-14 DIAGNOSIS — Z9889 Other specified postprocedural states: Secondary | ICD-10-CM

## 2023-04-14 DIAGNOSIS — E119 Type 2 diabetes mellitus without complications: Secondary | ICD-10-CM | POA: Diagnosis not present

## 2023-04-14 DIAGNOSIS — M5106 Intervertebral disc disorders with myelopathy, lumbar region: Secondary | ICD-10-CM

## 2023-04-14 DIAGNOSIS — R29898 Other symptoms and signs involving the musculoskeletal system: Secondary | ICD-10-CM | POA: Diagnosis not present

## 2023-04-14 DIAGNOSIS — M5116 Intervertebral disc disorders with radiculopathy, lumbar region: Secondary | ICD-10-CM | POA: Diagnosis not present

## 2023-04-14 DIAGNOSIS — M48061 Spinal stenosis, lumbar region without neurogenic claudication: Secondary | ICD-10-CM | POA: Diagnosis not present

## 2023-04-14 DIAGNOSIS — Z87898 Personal history of other specified conditions: Secondary | ICD-10-CM | POA: Diagnosis not present

## 2023-04-14 HISTORY — PX: LUMBAR LAMINECTOMY/DECOMPRESSION MICRODISCECTOMY: SHX5026

## 2023-04-14 LAB — CBG MONITORING, ED: Glucose-Capillary: 119 mg/dL — ABNORMAL HIGH (ref 70–99)

## 2023-04-14 LAB — GLUCOSE, CAPILLARY
Glucose-Capillary: 170 mg/dL — ABNORMAL HIGH (ref 70–99)
Glucose-Capillary: 186 mg/dL — ABNORMAL HIGH (ref 70–99)

## 2023-04-14 LAB — TYPE AND SCREEN
ABO/RH(D): O NEG
Antibody Screen: NEGATIVE

## 2023-04-14 SURGERY — LUMBAR LAMINECTOMY/DECOMPRESSION MICRODISCECTOMY 1 LEVEL
Anesthesia: General | Site: Spine Lumbar | Laterality: Right

## 2023-04-14 MED ORDER — MIDAZOLAM HCL 2 MG/2ML IJ SOLN
INTRAMUSCULAR | Status: DC | PRN
  Administered 2023-04-14: 2 mg via INTRAVENOUS

## 2023-04-14 MED ORDER — PROPOFOL 10 MG/ML IV BOLUS
INTRAVENOUS | Status: DC | PRN
  Administered 2023-04-14: 200 mg via INTRAVENOUS

## 2023-04-14 MED ORDER — ACETAMINOPHEN 10 MG/ML IV SOLN
INTRAVENOUS | Status: DC | PRN
  Administered 2023-04-14: 1000 mg via INTRAVENOUS

## 2023-04-14 MED ORDER — OXYCODONE HCL 5 MG/5ML PO SOLN: 5.0000 mg | Freq: Once | ORAL | Status: AC | PRN

## 2023-04-14 MED ORDER — FENTANYL CITRATE (PF) 100 MCG/2ML IJ SOLN
INTRAMUSCULAR | Status: AC
  Filled 2023-04-14: qty 2

## 2023-04-14 MED ORDER — SODIUM CHLORIDE (PF) 0.9 % IJ SOLN
INTRAMUSCULAR | Status: DC | PRN
  Administered 2023-04-14: 30 mL via INTRAMUSCULAR

## 2023-04-14 MED ORDER — FENTANYL CITRATE (PF) 100 MCG/2ML IJ SOLN
INTRAMUSCULAR | Status: DC | PRN
  Administered 2023-04-14: 100 ug via INTRAVENOUS

## 2023-04-14 MED ORDER — 0.9 % SODIUM CHLORIDE (POUR BTL) OPTIME
TOPICAL | Status: DC | PRN
  Administered 2023-04-14: 500 mL

## 2023-04-14 MED ORDER — METHYLPREDNISOLONE ACETATE 40 MG/ML IJ SUSP
INTRAMUSCULAR | Status: DC | PRN
  Administered 2023-04-14: 20 mg

## 2023-04-14 MED ORDER — PHENYTOIN SODIUM EXTENDED 100 MG PO CAPS
300.0000 mg | ORAL_CAPSULE | Freq: Every day | ORAL | Status: DC
Start: 2023-04-14 — End: 2023-04-16
  Administered 2023-04-14 – 2023-04-15 (×2): 300 mg via ORAL
  Filled 2023-04-14 (×2): qty 3

## 2023-04-14 MED ORDER — CEFAZOLIN SODIUM-DEXTROSE 2-4 GM/100ML-% IV SOLN
2.0000 g | Freq: Three times a day (TID) | INTRAVENOUS | Status: DC
  Administered 2023-04-14: 3 g via INTRAVENOUS

## 2023-04-14 MED ORDER — LIDOCAINE HCL (CARDIAC) PF 100 MG/5ML IV SOSY
PREFILLED_SYRINGE | INTRAVENOUS | Status: DC | PRN
  Administered 2023-04-14: 100 mg via INTRAVENOUS

## 2023-04-14 MED ORDER — CEFAZOLIN SODIUM-DEXTROSE 2-4 GM/100ML-% IV SOLN
INTRAVENOUS | Status: AC
  Filled 2023-04-14: qty 100

## 2023-04-14 MED ORDER — OXYCODONE HCL 5 MG PO TABS
ORAL_TABLET | ORAL | Status: AC
  Filled 2023-04-14: qty 1

## 2023-04-14 MED ORDER — ROCURONIUM BROMIDE 100 MG/10ML IV SOLN
INTRAVENOUS | Status: DC | PRN
  Administered 2023-04-14: 50 mg via INTRAVENOUS

## 2023-04-14 MED ORDER — PHENYLEPHRINE 80 MCG/ML (10ML) SYRINGE FOR IV PUSH (FOR BLOOD PRESSURE SUPPORT)
PREFILLED_SYRINGE | INTRAVENOUS | Status: DC | PRN
  Administered 2023-04-14 (×2): 160 ug via INTRAVENOUS
  Administered 2023-04-14: 80 ug via INTRAVENOUS
  Administered 2023-04-14: 160 ug via INTRAVENOUS

## 2023-04-14 MED ORDER — FENTANYL CITRATE (PF) 100 MCG/2ML IJ SOLN
25.0000 ug | INTRAMUSCULAR | Status: DC | PRN
  Administered 2023-04-14 (×2): 50 ug via INTRAVENOUS

## 2023-04-14 MED ORDER — MIDAZOLAM HCL 2 MG/2ML IJ SOLN
INTRAMUSCULAR | Status: AC
  Filled 2023-04-14: qty 2

## 2023-04-14 MED ORDER — PHENYLEPHRINE HCL-NACL 20-0.9 MG/250ML-% IV SOLN
INTRAVENOUS | Status: DC | PRN
  Administered 2023-04-14: 50 ug/min via INTRAVENOUS

## 2023-04-14 MED ORDER — BUPIVACAINE-EPINEPHRINE (PF) 0.5% -1:200000 IJ SOLN
INTRAMUSCULAR | Status: DC | PRN
  Administered 2023-04-14: 10 mL via PERINEURAL

## 2023-04-14 MED ORDER — ONDANSETRON HCL 4 MG/2ML IJ SOLN
INTRAMUSCULAR | Status: DC | PRN
  Administered 2023-04-14: 4 mg via INTRAVENOUS

## 2023-04-14 MED ORDER — ACETAMINOPHEN 10 MG/ML IV SOLN
INTRAVENOUS | Status: AC
  Filled 2023-04-14: qty 100

## 2023-04-14 MED ORDER — SUGAMMADEX SODIUM 500 MG/5ML IV SOLN
INTRAVENOUS | Status: DC | PRN
  Administered 2023-04-14: 400 mg via INTRAVENOUS

## 2023-04-14 MED ORDER — DEXAMETHASONE SODIUM PHOSPHATE 10 MG/ML IJ SOLN
INTRAMUSCULAR | Status: DC | PRN
  Administered 2023-04-14: 10 mg via INTRAVENOUS

## 2023-04-14 MED ORDER — SODIUM CHLORIDE 0.9 % IV SOLN: INTRAVENOUS | Status: DC | PRN

## 2023-04-14 MED ORDER — EPHEDRINE SULFATE-NACL 50-0.9 MG/10ML-% IV SOSY
PREFILLED_SYRINGE | INTRAVENOUS | Status: DC | PRN
  Administered 2023-04-14 (×2): 10 mg via INTRAVENOUS

## 2023-04-14 MED ORDER — OXYCODONE HCL 5 MG PO TABS
5.0000 mg | ORAL_TABLET | Freq: Once | ORAL | Status: AC | PRN
  Administered 2023-04-14: 5 mg via ORAL

## 2023-04-14 MED ORDER — PHENYTOIN SODIUM EXTENDED 100 MG PO CAPS
200.0000 mg | ORAL_CAPSULE | Freq: Every day | ORAL | Status: DC
  Administered 2023-04-14 – 2023-04-16 (×3): 200 mg via ORAL
  Filled 2023-04-14 (×3): qty 2

## 2023-04-14 MED ORDER — SURGIFLO WITH THROMBIN (HEMOSTATIC MATRIX KIT) OPTIME
TOPICAL | Status: DC | PRN
  Administered 2023-04-14: 1 via TOPICAL

## 2023-04-14 SURGICAL SUPPLY — 37 items
BASIN KIT SINGLE STR (MISCELLANEOUS) ×1 IMPLANT
BRUSH SCRUB EZ 4% CHG (MISCELLANEOUS) ×1 IMPLANT
BUR NEURO DRILL SOFT 3.0X3.8M (BURR) ×1 IMPLANT
DERMABOND ADVANCED .7 DNX12 (GAUZE/BANDAGES/DRESSINGS) ×1 IMPLANT
DERMABOND ADVANCED .7 DNX6 (GAUZE/BANDAGES/DRESSINGS) IMPLANT
DRAPE C-ARM XRAY 36X54 (DRAPES) ×2 IMPLANT
DRAPE LAPAROTOMY 100X77 ABD (DRAPES) ×1 IMPLANT
DRAPE MICROSCOPE SPINE 48X150 (DRAPES) ×1 IMPLANT
DRSG OPSITE POSTOP 3X4 (GAUZE/BANDAGES/DRESSINGS) ×1 IMPLANT
DRSG TEGADERM 4X4.75 (GAUZE/BANDAGES/DRESSINGS) ×1 IMPLANT
ELECT EZSTD 165MM 6.5IN (MISCELLANEOUS) ×1
ELECT REM PT RETURN 9FT ADLT (ELECTROSURGICAL) ×1
ELECTRODE EZSTD 165MM 6.5IN (MISCELLANEOUS) ×1 IMPLANT
ELECTRODE REM PT RTRN 9FT ADLT (ELECTROSURGICAL) ×1 IMPLANT
GLOVE BIOGEL PI IND STRL 8 (GLOVE) ×1 IMPLANT
GLOVE SURG SYN 7.5 E (GLOVE) ×1 IMPLANT
GLOVE SURG SYN 7.5 PF PI (GLOVE) ×1 IMPLANT
GOWN SRG XL LVL 3 NONREINFORCE (GOWNS) ×1 IMPLANT
GOWN STRL REUS W/ TWL LRG LVL3 (GOWN DISPOSABLE) ×1 IMPLANT
KIT WILSON FRAME (KITS) ×1 IMPLANT
KNIFE BAYONET SHORT DISCETOMY (MISCELLANEOUS) IMPLANT
NDL SAFETY ECLIPSE 18X1.5 (NEEDLE) ×1 IMPLANT
NDL SPNL 22GX3.5 QUINCKE BK (NEEDLE) IMPLANT
NEEDLE SPNL 22GX3.5 QUINCKE BK (NEEDLE) ×1 IMPLANT
NS IRRIG 500ML POUR BTL (IV SOLUTION) ×1 IMPLANT
PACK LAMINECTOMY ARMC (PACKS) ×1 IMPLANT
PAD ARMBOARD 7.5X6 YLW CONV (MISCELLANEOUS) ×2 IMPLANT
PATTIES SURGICAL .5 X.5 (GAUZE/BANDAGES/DRESSINGS) IMPLANT
PATTIES SURGICAL 1X1 (DISPOSABLE) IMPLANT
SLEEVE SCD COMPRESS KNEE MED (STOCKING) IMPLANT
SURGIFLO W/THROMBIN 8M KIT (HEMOSTASIS) ×1 IMPLANT
SUT STRATA 3-0 15 PS-2 (SUTURE) ×1 IMPLANT
SUT VIC AB 0 CT1 27XCR 8 STRN (SUTURE) ×1 IMPLANT
SUT VIC AB 2-0 CT1 18 (SUTURE) ×1 IMPLANT
SYR 30ML LL (SYRINGE) ×2 IMPLANT
SYR 3ML LL SCALE MARK (SYRINGE) ×1 IMPLANT
TRAP FLUID SMOKE EVACUATOR (MISCELLANEOUS) ×1 IMPLANT

## 2023-04-14 NOTE — Anesthesia Procedure Notes (Signed)
 Procedure Name: Intubation Date/Time: 04/14/2023 12:16 PM  Performed by: Jaylene Nest, CRNAPre-anesthesia Checklist: Patient identified, Patient being monitored, Timeout performed, Emergency Drugs available and Suction available Patient Re-evaluated:Patient Re-evaluated prior to induction Oxygen Delivery Method: Circle system utilized Preoxygenation: Pre-oxygenation with 100% oxygen Induction Type: IV induction Ventilation: Mask ventilation without difficulty Laryngoscope Size: McGrath and 4 Grade View: Grade I Tube type: Oral Tube size: 7.5 mm Number of attempts: 1 Airway Equipment and Method: Stylet and Video-laryngoscopy Placement Confirmation: ETT inserted through vocal cords under direct vision, positive ETCO2 and breath sounds checked- equal and bilateral Secured at: 21 cm Tube secured with: Tape Dental Injury: Teeth and Oropharynx as per pre-operative assessment

## 2023-04-14 NOTE — Anesthesia Postprocedure Evaluation (Signed)
 Anesthesia Post Note  Patient: Thomas Cole  Procedure(s) Performed: L4-5 Microdiscectomy (Right: Spine Lumbar)  Patient location during evaluation: PACU Anesthesia Type: General Level of consciousness: awake and alert Pain management: pain level controlled Vital Signs Assessment: post-procedure vital signs reviewed and stable Respiratory status: spontaneous breathing, nonlabored ventilation, respiratory function stable and patient connected to nasal cannula oxygen Cardiovascular status: blood pressure returned to baseline and stable Postop Assessment: no apparent nausea or vomiting Anesthetic complications: no   No notable events documented.   Last Vitals:  Vitals:   04/14/23 1445 04/14/23 1500  BP: 137/85 (!) 121/93  Pulse: 96 95  Resp: 18 14  Temp:  36.6 C  SpO2: 96% 95%    Last Pain:  Vitals:   04/14/23 1500  TempSrc:   PainSc: 5                  Fairy POUR Kamonte Mcmichen

## 2023-04-14 NOTE — Transfer of Care (Signed)
 Immediate Anesthesia Transfer of Care Note  Patient: Thomas Cole  Procedure(s) Performed: L4-5 Microdiscectomy (Right: Spine Lumbar)  Patient Location: PACU  Anesthesia Type:General  Level of Consciousness: awake, alert , and oriented  Airway & Oxygen Therapy: Patient Spontanous Breathing and Patient connected to face mask oxygen  Post-op Assessment: Report given to RN and Post -op Vital signs reviewed and stable  Post vital signs: Reviewed and stable  Last Vitals:  Vitals Value Taken Time  BP 144/76 04/14/23 1346  Temp    Pulse 105 04/14/23 1348  Resp 15 04/14/23 1348  SpO2 96 % 04/14/23 1348  Vitals shown include unfiled device data.  Last Pain:  Vitals:   04/14/23 1023  TempSrc: Oral  PainSc: 10-Worst pain ever         Complications: No notable events documented.

## 2023-04-14 NOTE — Op Note (Signed)
 Indications: [name] is suffering from lumbar radiculopathy.  He unfortunately had progressive pain weakness and numbness in his right lower extremity predominantly causing him to have difficulty ambulating.  This caused him to present to the emergency department for urgent evaluation.  He was found to have his known previous lumbar stenosis with a new sequestered disc herniation at L4-5 causing compression of the nerve roots.  Findings: Large herniated disc material causing severe compression of the traversing nerve root.    Preoperative Diagnosis: Lumbar radiculopathy (ICD-10 M54.16) Postoperative Diagnosis: same   EBL: 50cc IVF: see anesthesia record Drains: none Disposition: Extubated and Stable to PACU Complications: none  No foley catheter was placed.   Preoperative Note:   Risks of surgery discussed include: infection, bleeding, stroke, coma, death, paralysis, CSF leak, nerve/spinal cord injury, numbness, tingling, weakness, complex regional pain syndrome, recurrent stenosis and/or disc herniation, vascular injury, development of instability, neck/back pain, need for further surgery, persistent symptoms, development of deformity, and the risks of anesthesia. The patient understood these risks and agreed to proceed.  Operative Note:   1) right L 4/5 microdiscectomy  The patient was then brought from the preoperative center with intravenous access established.  The patient underwent general anesthesia and endotracheal tube intubation, and was then rotated on the Hunter rail top where all pressure points were appropriately padded.  The skin was then thoroughly cleansed.  Perioperative antibiotic prophylaxis was administered.  Sterile prep and drapes were then applied and a timeout was then observed.  C-arm was brought into the field under sterile conditions, and the L 4-5 disc space identified and marked with an incision on the right 1cm at the superior aspect of his prior  incision for his L5-S1 discectomy  Once this was complete a 2 cm incision was opened with the use of a #10 blade knife.  The Metrx tubes were sequentially advanced under lateral fluoroscopy until a 18 x 80 mm Metrx tube was placed over the facet and lamina and secured to the bed.    The microscope was then sterilely brought into the field and muscle creep was hemostased with a bipolar and resected with a pituitary rongeur.  A Bovie extender was then used to expose the spinous process and lamina.  Careful attention was placed to not violate the facet capsule. A 3 mm matchstick drill bit was then used to make a hemi-laminotomy trough until the ligamentum flavum was exposed.  This was extended to the base of the spinous process.  Once this was complete and the underlying ligamentum flavum was visualized, the ligamentum was dissected with an up angle curette and resected with a #2 and #3 mm biting Kerrison.  The laminotomy opening was also expanded in similar fashion and hemostasis was obtained with Surgifoam and a patty as well as bone wax.  The rostral aspect of the caudal level of the lamina was also resected with a #2 biting Kerrison effort to further enhance exposure.  Once the underlying dura was visualized a Penfield 4 was then used to dissect and expose the traversing nerve root.  Once this was identified a nerve root retractor suction was used to mobilize this medially.  While doing this a large sequestered fragment was identified.  It was removed with a pituitary rongeur.  The nerve had immediate decompression and lateralizing itself and showing CSF pulsations present in the dural sheath which was not there previously.  The venous plexus was hemostased with Surgifoam and light bipolar use.  We  explored the remainder of the disc space for any more disc herniations.  Annulotomy was explored and no more disc material was expressed..    Once the thecal sac and nerve root were noted to be relaxed and under  less tension the ball-tipped feeler was passed along the foramen distally to ensure no residual compression was noted.    Depo-Medrol  was placed along the nerve root.  The area was irrigated. The tube system was then removed under microscopic visualization and hemostasis was obtained with a bipolar.    The fascial layer was reapproximated with the use of a 0- Vicryl suture.  Subcutaneous tissue layer was reapproximated using 2-0 Vicryl suture.  3-0 STRATAFIX was used on the skin. The skin was then cleansed and Dermabond was used to close the skin opening.  Patient was then rotated back to the preoperative bed awakened from anesthesia and taken to recovery all counts are correct in this case.   I performed this procedure without an designer, television/film set.  Penne MICAEL Sharps, MD

## 2023-04-14 NOTE — Plan of Care (Signed)
 Discussed plan of care with patient and his SO, verbalized understanding.

## 2023-04-14 NOTE — Anesthesia Preprocedure Evaluation (Signed)
 Anesthesia Evaluation  Patient identified by MRN, date of birth, ID band Patient awake    Reviewed: Allergy & Precautions, NPO status , Patient's Chart, lab work & pertinent test results  History of Anesthesia Complications (+) DIFFICULT AIRWAY and history of anesthetic complications  Airway Mallampati: III  TM Distance: <3 FB Neck ROM: full    Dental  (+) Chipped   Pulmonary asthma , sleep apnea    Pulmonary exam normal        Cardiovascular Exercise Tolerance: Good hypertension, Normal cardiovascular exam     Neuro/Psych  Headaches, Seizures -, Well Controlled,  PSYCHIATRIC DISORDERS       Neuromuscular disease    GI/Hepatic Neg liver ROS,GERD  Controlled,,  Endo/Other  diabetes, Type 2    Renal/GU      Musculoskeletal   Abdominal   Peds  Hematology negative hematology ROS (+)   Anesthesia Other Findings Past Medical History: No date: Anxiety No date: Asthma No date: Back problem 11/30/2016: Benign essential hypertension 09/17/2012: Cervical spondylosis without myelopathy No date: Complication of anesthesia     Comment:  occ takes longer to wake No date: Depression 02/2020: Diabetes mellitus without complication (HCC)     Comment:  Type 2 No date: GERD (gastroesophageal reflux disease) No date: Hypertension 03/19/2013: Labile hypertension     Comment:  No current neuro changes, headache much improved. No               clear sign of intracranial bleed. NO indication for head               CT>  BP much better now... Fluctuates a lot.  Will eval               for pheochromocytoma. As recommended at last cardiology               OV.. Will prescribe hydralazine  to use as needed for BP               fluctuations.  Close follow up with PCP.  06/23/2015: Major depressive disorder, recurrent episode, severe (HCC) No date: Migraine headache 11/11/2013: Migraines 04/09/2011: S/P appendectomy 11/30/2016: Seizure  (HCC) No date: Seizures (HCC)     Comment:  last 86 No date: Shortness of breath 08/06/2014: SOB (shortness of breath)     Comment:  - [facility] eval with cpst 02/20/2008 exercise testing               demonstrateed normal functional capabilities and a normal              cardiopulmonary response to exercise. - 09/16/2014  Walked              RA x 3 laps @ 185 ft each stopped due to end of study/ nl              pace/ no desat  - spirometry 09/16/2014 > no obst/ min               restriction     Past Surgical History: 09/15/2012: ANTERIOR CERVICAL CORPECTOMY; N/A     Comment:  Procedure: Cervical Three-Four,Cervical Six-Seven               Anterior cervical decompression/diskectomy fusion with               Cervical Four Corpectomy;  Surgeon: Victory Gens, MD;                Location: [facility];  Service: Neurosurgery;                Laterality: N/A;  Cervical Three-Four,Cervical Six-Seven               Anterior cervical decompression/diskectomy fusion with               Cervical four Corpectomy 12: APPENDECTOMY No date: CERVICAL DISC SURGERY 03/29/2017: CHOLECYSTECTOMY; N/A     Comment:  Procedure: LAPAROSCOPIC CHOLECYSTECTOMY;  Surgeon:               Wonda Charlie BRAVO, MD;  Location: [facility] ORS;  Service:               General;  Laterality: N/A; 10/23/2019: COLONOSCOPY WITH PROPOFOL ; N/A     Comment:  Procedure: COLONOSCOPY WITH PROPOFOL ;  Surgeon: Dessa Reyes ORN, MD;  Location: [facility];  Service:               Endoscopy;  Laterality: N/A; 10/23/2019: ESOPHAGOGASTRODUODENOSCOPY (EGD) WITH PROPOFOL ; N/A     Comment:  Procedure: ESOPHAGOGASTRODUODENOSCOPY (EGD) WITH               PROPOFOL ;  Surgeon: Dessa Reyes ORN, MD;  Location:               [facility];  Service: Endoscopy;  Laterality: N/A; No date: KNEE SURGERY     Comment:  bilateral No date: POSTERIOR FUSION CERVICAL SPINE  BMI    Body Mass Index: 37.50 kg/m      Reproductive/Obstetrics negative  OB ROS                             Anesthesia Physical Anesthesia Plan  ASA: 3  Anesthesia Plan: General ETT   Post-op Pain Management:    Induction: Intravenous  PONV Risk Score and Plan: Ondansetron , Dexamethasone , Midazolam  and Treatment may vary due to age or medical condition  Airway Management Planned: Oral ETT and Video Laryngoscope Planned  Additional Equipment:   Intra-op Plan:   Post-operative Plan: Extubation in OR  Informed Consent: I have reviewed the patients History and Physical, chart, labs and discussed the procedure including the risks, benefits and alternatives for the proposed anesthesia with the patient or authorized representative who has indicated his/her understanding and acceptance.     Dental Advisory Given  Plan Discussed with: Anesthesiologist, CRNA and Surgeon  Anesthesia Plan Comments: (Patient consented for risks of anesthesia including but not limited to:  - adverse reactions to medications - damage to eyes, teeth, lips or other oral mucosa - nerve damage due to positioning  - sore throat or hoarseness - Damage to heart, brain, nerves, lungs, other parts of body or loss of life  Patient voiced understanding and assent.)       Anesthesia Quick Evaluation

## 2023-04-14 NOTE — Interval H&P Note (Signed)
 History and Physical Interval Note:  04/14/2023 11:08 AM  Thomas Cole  has presented today for surgery, with the diagnosis of Right L4 Radiculopathy.  The various methods of treatment have been discussed with the patient and family. After consideration of risks, benefits and other options for treatment, the patient has consented to  Procedure(s): L4-5 Microdiscectomy (Right) as a surgical intervention.  The patient's history has been reviewed, patient examined, no change in status, stable for surgery.  I have reviewed the patient's chart and labs.  Questions were answered to the patient's satisfaction.    Heart and lungs clear this morning  [name] Sharps

## 2023-04-14 NOTE — Progress Notes (Signed)
 Postop evaluation  Postop day 0 from a right sided L4-5 microdiscectomy for herniated disc and weakness secondary to radiculopathy.  Doing well in PACU.  No worsening of his deficits.  Dorsiflexion with slight increase to 4+ out of 5.  Neurosurgery recommendations  1.  Okay to mobilize, will require a PT/OT evaluation.  Lives at home alone. 2.   No need for new imaging 3.   Okay to advance his diet 4.   Okay for NSAIDs, narcotic pain medication, and muscle relaxants 5.    No drain was placed 6.    Wound is closed with a skin glue for dressing. 7.    Please contact neurosurgery if any postoperative issues arise.  Updated the family.

## 2023-04-14 NOTE — Progress Notes (Signed)
 Progress Note   Patient: Thomas Cole FMW:[id number] DOB: [birth year] DOA: 04/12/2023     1 DOS: the patient was seen and examined on 04/14/2023   Brief hospital course: Thomas Cole is a 53 y.o. male with medical history significant of  spinal cord injury, chronic neck and back pain, remote history of  lumbar decompressive surgery and cervical spine surgery, HTN, DM, asthma, depression with anxiety, seizure, PTSD, obesity, OSA not on CPAP, migraine, who presents with back pain.   MRI L spine showed - Right subarticular disc extrusion with superior migration and probable sequestration at L4-5. Findings superimposed on additional multifactorial degenerative changes at this level resultant severe canal with bilateral subarticular stenosis, with severe right and moderate to severe left L4 foraminal narrowing. Finding could contribute to right-sided radicular symptoms.  Patient is admitted to the hospitalist service for pain control, neurosurgery consultation. He had right sided L4-5 microdiscectomy for herniated disc 04/14/23.  Assessment and Plan: Spinal stenosis Lumbar radiculopathy S/p  right sided L4-5 microdiscectomy for herniated disc 04/14/23. Appreciated neurosurgery recommendations. Continue IV Dilaudid  and Norco for pain control. Continue muscle relaxants. Taper prednisone  to 40 mg daily. PT/ OT evaluation. Out of bed to chair.  Asthma, mild intermittent- Continue bronchodilators. As needed Mucinex .  Diabetes mellitus type 2: Well-controlled, A1c 6.1. Continue sliding scale insulin  as per blood protocol.  Seizure disorder Continue home dose Lamictal , Dilantin  home dose. On Sinemet  for suspicion of parkinsonism.  Obesity with BMI 37.5: Diet, exercise and weight reduction advised.    Out of bed to chair. Incentive spirometry. Nursing supportive care. Fall, aspiration precautions. DVT prophylaxis heparin    Code Status: Full Code  Subjective: Patient is seen and  examined today morning.  He is sitting on the edge of the bed. Mother at bedside, he has tingling numbness right lower extremity and back pain. Awaiting neurosurgical procedure.  Physical Exam: Vitals:   04/14/23 0737 04/14/23 1023 04/14/23 1345 04/14/23 1400  BP: (!) 112/57 (!) 153/94 (!) 144/76 132/77  Pulse: 87 76 (!) 107 97  Resp: 17 20 11 13   Temp: 97.7 F (36.5 C) 98.2 F (36.8 C) 97.6 F (36.4 C)   TempSrc: Oral Oral    SpO2: 97% 99% 95% 97%  Weight:      Height:        General - Middle aged obese Caucasian male, mild distress due to pain HEENT - PERRLA, EOMI, atraumatic head, non tender sinuses. Lung - Clear, basal rales, rhonchi, wheezes. Heart - S1, S2 heard, no murmurs, rubs, trace pedal edema. Abdomen - Soft, non tender, bowel sounds good Neuro - Alert, awake and oriented x 3, non focal exam. Skin - Warm and dry.  Data Reviewed:      Latest Ref Rng & Units 04/13/2023   12:05 AM 04/21/2017    4:05 PM 03/30/2017    6:13 AM  CBC  WBC 4.0 - 10.5 K/uL 9.2  7.8  11.6   Hemoglobin 13.0 - 17.0 g/dL 84.0  84.9  86.4   Hematocrit 39.0 - 52.0 % 46.7  43.7  39.2   Platelets 150 - 400 K/uL 198  172  163       Latest Ref Rng & Units 04/13/2023   12:05 AM 04/21/2017    4:05 PM 03/30/2017    6:13 AM  BMP  Glucose 70 - 99 mg/dL 809  887  97   BUN 6 - 20 mg/dL 14  13  16    Creatinine 0.61 - 1.24 mg/dL  1.01  0.93  0.77   Sodium 135 - 145 mmol/L 133  138  138   Potassium 3.5 - 5.1 mmol/L 3.4  3.7  3.5   Chloride 98 - 111 mmol/L 97  101  105   CO2 22 - 32 mmol/L 22  28  26    Calcium 8.9 - 10.3 mg/dL 9.6  9.4  8.5    DG Lumbar Spine 2-3 Views Result Date: 04/14/2023 CLINICAL DATA:  [id number] Surgery, elective [id number] EXAM: LUMBAR SPINE - 2-3 VIEW COMPARISON:  04/12/2023 FINDINGS: A single C-arm fluoroscopic image was obtained intraoperatively and submitted for post operative interpretation. Surgical hardware is evident at the L4-L5 disc level. 2 seconds of fluoroscopy time  utilized. Radiation dose: 2.47 mGy. Please see the performing provider's procedural report for further detail. IMPRESSION: Intraoperative fluoroscopy for lumbar spine surgery. Electronically Signed   By: Mabel Converse D.O.   On: 04/14/2023 14:03   DG C-Arm 1-60 Min-No Report Result Date: 04/14/2023 Fluoroscopy was utilized by the requesting physician.  No radiographic interpretation.   MR Lumbar Spine Wo Contrast Result Date: 04/12/2023 CLINICAL DATA:  Initial evaluation for acute right lower back pain with radiation into the right lower extremity. EXAM: MRI LUMBAR SPINE WITHOUT CONTRAST TECHNIQUE: Multiplanar, multisequence MR imaging of the lumbar spine was performed. No intravenous contrast was administered. COMPARISON:  Prior MRI from 04/28/2021. FINDINGS: Segmentation: Standard. Lowest well-formed disc space labeled the L5-S1 level. Alignment: Trace degenerative retrolisthesis of L2 on L3 and L3 on L4. Alignment otherwise normal preservation of the normal lumbar lordosis. Vertebrae: Vertebral body height maintained without acute or chronic fracture. Bone marrow signal intensity within normal limits. No worrisome osseous lesions. Degenerative reactive endplate changes present about the L3-4 and L4-5 interspaces. Conus medullaris and cauda equina: Conus extends to the L1 level. Conus medullaris within normal limits. Scattered irregularity of the nerve roots of the cauda equina related to distal stenosis noted. Paraspinal and other soft tissues: Paraspinous soft tissues within normal limits. Few scattered T2 hyperintense simple renal cysts noted, benign in appearance, no follow-up imaging recommended. Disc levels: A degree of underlying congenital spinal stenosis noted. L1-2: Normal interspace. Mild facet hypertrophy. No canal or foraminal stenosis. L2-3: Diffuse disc bulge with disc desiccation and intervertebral disc space narrowing. Reactive endplate spurring. Mild to moderate facet hypertrophy. Mild  prominence of the dorsal epidural fat. Resultant severe spinal stenosis. Mild to moderate bilateral L2 foraminal narrowing. L3-4: Diffuse disc bulge with disc desiccation. Reactive endplate spurring. Superimposed tiny central disc extrusion with inferior migration. Moderate facet hypertrophy. Prominence of the dorsal epidural fat. Resultant severe spinal stenosis. Mild to moderate left worse than right L3 foraminal stenosis. L4-5: Diffuse disc bulge with disc desiccation. Reactive endplate spurring. Superimposed right subarticular disc extrusion with superior migration and probable sequestration (series 5, image 10). Moderate bilateral facet hypertrophy. Prominence of the dorsal epidural fat. Resultant severe canal with bilateral subarticular stenosis. Severe right with moderate to severe left L4 foraminal narrowing. L5-S1: Diffuse disc bulge with disc desiccation. Reactive endplate spurring. Superimposed central disc protrusion with annular fissure indents the ventral thecal sac. Prior left hemi laminectomy. Moderate left worse than right facet hypertrophy. No significant spinal stenosis. Moderate left worse than right L5 foraminal narrowing. IMPRESSION: 1. Right subarticular disc extrusion with superior migration and probable sequestration at L4-5. Findings superimposed on additional multifactorial degenerative changes at this level resultant severe canal with bilateral subarticular stenosis, with severe right and moderate to severe left L4 foraminal narrowing. Finding could contribute to  right-sided radicular symptoms. 2. Additional multilevel lumbar spondylosis with resultant severe spinal stenosis at L2-3, L3-4, and L4-5. 3. Multifactorial degenerative changes with resultant multilevel foraminal narrowing as above. Notable findings include mild to moderate bilateral L2 and L3 foraminal stenosis, with moderate left worse than right L5 foraminal narrowing. Electronically Signed   By: Morene Hoard M.D.    On: 04/12/2023 21:09   Family Communication: Discussed with patient, mother at bedside. They understand and agree. All questions answereed.  Disposition: Status is: inpatient Because of Neurosurgery procedure, PT/ OT evaluation  Planned Discharge Destination: Home     Time spent: 37 minutes  Author: Concepcion Riser, MD 04/14/2023 2:08 PM Secure chat 7am to 7pm For on call review [email or web address].

## 2023-04-15 ENCOUNTER — Encounter: Payer: Self-pay | Admitting: Neurosurgery

## 2023-04-15 DIAGNOSIS — E119 Type 2 diabetes mellitus without complications: Secondary | ICD-10-CM | POA: Diagnosis not present

## 2023-04-15 DIAGNOSIS — M48061 Spinal stenosis, lumbar region without neurogenic claudication: Secondary | ICD-10-CM | POA: Diagnosis not present

## 2023-04-15 DIAGNOSIS — R29898 Other symptoms and signs involving the musculoskeletal system: Secondary | ICD-10-CM | POA: Diagnosis not present

## 2023-04-15 DIAGNOSIS — Z87898 Personal history of other specified conditions: Secondary | ICD-10-CM | POA: Diagnosis not present

## 2023-04-15 LAB — CBC
HCT: 43.5 % (ref 39.0–52.0)
Hemoglobin: 15.2 g/dL (ref 13.0–17.0)
MCH: 30.2 pg (ref 26.0–34.0)
MCHC: 34.9 g/dL (ref 30.0–36.0)
MCV: 86.5 fL (ref 80.0–100.0)
Platelets: 205 10*3/uL (ref 150–400)
RBC: 5.03 MIL/uL (ref 4.22–5.81)
RDW: 12.8 % (ref 11.5–15.5)
WBC: 15 10*3/uL — ABNORMAL HIGH (ref 4.0–10.5)
nRBC: 0 % (ref 0.0–0.2)

## 2023-04-15 LAB — BASIC METABOLIC PANEL
Anion gap: 14 (ref 5–15)
BUN: 22 mg/dL — ABNORMAL HIGH (ref 6–20)
CO2: 25 mmol/L (ref 22–32)
Calcium: 9.6 mg/dL (ref 8.9–10.3)
Chloride: 97 mmol/L — ABNORMAL LOW (ref 98–111)
Creatinine, Ser: 1.05 mg/dL (ref 0.61–1.24)
GFR, Estimated: >60 mL/min (ref >60)
Glucose, Bld: 112 mg/dL — ABNORMAL HIGH (ref 70–99)
Potassium: 3.9 mmol/L (ref 3.5–5.1)
Sodium: 136 mmol/L (ref 135–145)

## 2023-04-15 LAB — GLUCOSE, CAPILLARY
Glucose-Capillary: 108 mg/dL — ABNORMAL HIGH (ref 70–99)
Glucose-Capillary: 174 mg/dL — ABNORMAL HIGH (ref 70–99)
Glucose-Capillary: 188 mg/dL — ABNORMAL HIGH (ref 70–99)
Glucose-Capillary: 142 mg/dL — ABNORMAL HIGH (ref 70–99)

## 2023-04-15 MED ORDER — MAGNESIUM HYDROXIDE 400 MG/5ML PO SUSP
15.0000 mL | Freq: Every day | ORAL | Status: DC | PRN
  Administered 2023-04-15: 15 mL via ORAL
  Filled 2023-04-15: qty 30

## 2023-04-15 MED ORDER — PREDNISONE 20 MG PO TABS
20.0000 mg | ORAL_TABLET | Freq: Every day | ORAL | Status: DC
  Administered 2023-04-16: 20 mg via ORAL
  Filled 2023-04-15: qty 1

## 2023-04-15 NOTE — Evaluation (Signed)
 Physical Therapy Evaluation Patient Details Name: Thomas Cole MRN: [id number] DOB: 1970-07-31 Today's Date: 04/15/2023  History of Present Illness  Pt is a 53 y/o M admitted on 04/12/23 after presenting with c/o back pain progressively worsening x 2 days. MRI showed stenosis worsened by epidural lipomatosis and also has disc herniations causing worsening stenosis. Pt underwent R sided L4-5 microdiscectomy for herniated disc on 04/14/23. PMH: SCI, chronic neck & back pain, lumbar decompressive sx, cervical spine sx, HTN, DM, asthma, depression, anxiety, seizure, PTSD, obesity, OSA not on CPAP, migraine  Clinical Impression  Pt seen for PT evaluation with pt received sitting EOB, agreeable to tx. Pt reports prior to admission he was ambulatory with rollator or SPC in the home, still driving, using power w/c outside of the home. On this date, pt demonstrates good safety awareness with mobility. Pt is able to transfer STS with supervision, ambulate with RW & CGA, & complete bed mobility with log rolling technique. Pt eager to d/c home. Pt would benefit from ongoing acute PT services to address activity tolerance, strengthening, gait & stair negotiation.        If plan is discharge home, recommend the following: A little help with bathing/dressing/bathroom;Assistance with cooking/housework;Assist for transportation;Help with stairs or ramp for entrance   Can travel by private vehicle        Equipment Recommendations None recommended by PT  Recommendations for Other Services       Functional Status Assessment Patient has had a recent decline in their functional status and demonstrates the ability to make significant improvements in function in a reasonable and predictable amount of time.     Precautions / Restrictions Precautions Precautions: Fall;Back Restrictions Weight Bearing Restrictions Per Provider Order: No      Mobility  Bed Mobility Overal bed mobility: Needs Assistance Bed  Mobility: Rolling, Sit to Sidelying Rolling: Supervision, Used rails       Sit to sidelying: Supervision, HOB elevated, Used rails General bed mobility comments: good demo of log rolling during sit>supine    Transfers Overall transfer level: Needs assistance Equipment used: Rolling Shamoon (2 wheels) Transfers: Sit to/from Stand Sit to Stand: Supervision, From elevated surface           General transfer comment: STS from elevated surface 2/2 pt's height, cuing to push to standing when using RW (vs rollator he's accustomed to using at home)    Ambulation/Gait Ambulation/Gait assistance: Contact guard assist Gait Distance (Feet): 50 Feet Assistive device: Rolling Ferraiolo (2 wheels) Gait Pattern/deviations: Decreased step length - right, Decreased step length - left, Decreased stride length Gait velocity: decreased        Stairs            Wheelchair Mobility     Tilt Bed    Modified Rankin (Stroke Patients Only)       Balance Overall balance assessment: Needs assistance Sitting-balance support: Feet supported Sitting balance-[name] Scale: Good     Standing balance support: During functional activity, Bilateral upper extremity supported, Reliant on assistive device for balance Standing balance-[name] Scale: Fair                               Pertinent Vitals/Pain Pain Assessment Pain Assessment: 0-10 Pain Score: 5  Pain Location: back, incision Pain Descriptors / Indicators: Discomfort, Sore Pain Intervention(s): Monitored during session, Limited activity within patient's tolerance, Repositioned    Home Living Family/patient expects to be discharged to::  Private residence Living Arrangements: Alone Available Help at Discharge: Family;Friend(s);Neighbor;Available PRN/intermittently Type of Home: House Home Access: Stairs to enter Entrance Stairs-Rails: Right Entrance Stairs-Number of Steps: 2-3   Home Layout: One level Home Equipment:  Agricultural Consultant (2 wheels);Rollator (4 wheels);Wheelchair - power;Cane - single point, bed rail      Prior Function Prior Level of Function : Driving;Independent/Modified Independent             Mobility Comments: Ambulatory with cane or rollator in the house, uses power w/c for community distances, still driving, denies falls.       Extremity/Trunk Assessment   Upper Extremity Assessment Upper Extremity Assessment: Overall WFL for tasks assessed    Lower Extremity Assessment Lower Extremity Assessment: Generalized weakness (3+/5 knee extension, 3/5 hip flexion in sitting, pt denies numbness/tingling in BLE, notes sensation has improved compared to prior to sx)    Cervical / Trunk Assessment Cervical / Trunk Assessment: Back Surgery  Communication   Communication Communication: No apparent difficulties  Cognition Arousal: Alert Behavior During Therapy: WFL for tasks assessed/performed Overall Cognitive Status: Within Functional Limits for tasks assessed                                 General Comments: pleasant, good awareness of CLOF        General Comments      Exercises     Assessment/Plan    PT Assessment Patient needs continued PT services  PT Problem List Decreased strength;Pain;Decreased range of motion;Decreased activity tolerance;Decreased balance;Decreased mobility;Decreased knowledge of use of DME       PT Treatment Interventions DME instruction;Balance training;Modalities;Gait training;Neuromuscular re-education;Stair training;Functional mobility training;Therapeutic activities;Therapeutic exercise;Manual techniques;Patient/family education    PT Goals (Current goals can be found in the Care Plan section)  Acute Rehab PT Goals Patient Stated Goal: go home PT Goal Formulation: With patient Time For Goal Achievement: 04/29/23 Potential to Achieve Goals: Good    Frequency Min 1X/week     Co-evaluation               AM-PAC  PT 6 Clicks Mobility  Outcome Measure Help needed turning from your back to your side while in a flat bed without using bedrails?: None Help needed moving from lying on your back to sitting on the side of a flat bed without using bedrails?: A Little Help needed moving to and from a bed to a chair (including a wheelchair)?: A Little Help needed standing up from a chair using your arms (e.g., wheelchair or bedside chair)?: A Little Help needed to walk in hospital room?: A Little Help needed climbing 3-5 steps with a railing? : A Little 6 Click Score: 19    End of Session   Activity Tolerance: Patient tolerated treatment well;Patient limited by fatigue Patient left: in bed;with call bell/phone within reach;with bed alarm set   PT Visit Diagnosis: Muscle weakness (generalized) (M62.81);Other abnormalities of gait and mobility (R26.89);Unsteadiness on feet (R26.81);Pain Pain - part of body:  (back)    Time: 9086-9069 PT Time Calculation (min) (ACUTE ONLY): 17 min   Charges:   PT Evaluation $PT Eval Moderate Complexity: 1 Mod   PT General Charges $$ ACUTE PT VISIT: 1 Visit         Thomas Cole, PT, DPT 04/15/23, 9:39 AM   Thomas Cole 04/15/2023, 9:38 AM

## 2023-04-15 NOTE — Progress Notes (Signed)
   Neurosurgery Progress Note  History: Thomas Cole is s/p right L4-5 microdiscectomy  POD1: Improved leg pain overnight   Physical Exam: Vitals:   04/14/23 2318 04/15/23 0340  BP: 136/82 132/76  Pulse: (!) 101 85  Resp:    Temp: 98 F (36.7 C) 97.6 F (36.4 C)  SpO2: 94% 94%    AA Ox3 CNI  Strength:5/5 throughout except 4+ right EHL   Data:  Other tests/results: NA  Assessment/Plan:  Thomas Cole is a 53 y.o presenting with acute right sided radiculopathy and weakness s/o right L4-5 microdiscectomy.   - mobilize - pain control - DVT prophylaxis - PTOT; cleared from neurosurgical standpoint for d/c pending therapy evaluation.  - will set up post-op follow up  Edsel Goods PA-C Department of Neurosurgery

## 2023-04-15 NOTE — Progress Notes (Signed)
 Progress Note   Patient: Thomas Cole FMW:[id number] DOB: [birth year] DOA: 04/12/2023     2 DOS: the patient was seen and examined on 04/15/2023   Brief hospital course: Thomas Cole is a 53 y.o. male with medical history significant of  spinal cord injury, chronic neck and back pain, remote history of  lumbar decompressive surgery and cervical spine surgery, HTN, DM, asthma, depression with anxiety, seizure, PTSD, obesity, OSA not on CPAP, migraine, who presents with back pain.   MRI L spine showed - Right subarticular disc extrusion with superior migration and probable sequestration at L4-5. Findings superimposed on additional multifactorial degenerative changes at this level resultant severe canal with bilateral subarticular stenosis, with severe right and moderate to severe left L4 foraminal narrowing. Finding could contribute to right-sided radicular symptoms.  Patient is admitted to the hospitalist service for pain control, neurosurgery consultation. He had right sided L4-5 microdiscectomy for herniated disc 04/14/23.   Assessment and Plan: Spinal stenosis Lumbar radiculopathy S/p right sided L4-5 microdiscectomy for herniated disc 04/14/23. Appreciated neurosurgery follow up. Continue pain control, muscle relaxants. Taper prednisone  to 20 mg daily. PT/ OT evaluation for discharge planning. Out of bed to chair.  Asthma, mild intermittent- Continue bronchodilators. As needed Mucinex .  Diabetes mellitus type 2: Well-controlled, A1c 6.1. Continue sliding scale insulin  as per blood protocol.  Seizure disorder Continue home dose Lamictal , Dilantin  home dose. On Sinemet  for suspicion of parkinsonism.  Obesity with BMI 37.5: Diet, exercise and weight reduction advised.    Out of bed to chair. Incentive spirometry. Nursing supportive care. Fall, aspiration precautions. DVT prophylaxis heparin    Code Status: Full Code  Subjective: Patient is seen and examined today morning.   He is sitting on the edge of the bed. Tingling numbness right lower extremity and back pain better. Advised to work with PT.  Physical Exam: Vitals:   04/14/23 2000 04/14/23 2318 04/15/23 0340 04/15/23 0844  BP: 138/87 136/82 132/76 116/69  Pulse: 100 (!) 101 85 76  Resp: 16   18  Temp: 98 F (36.7 C) 98 F (36.7 C) 97.6 F (36.4 C) 97.7 F (36.5 C)  TempSrc: Oral Oral Oral Oral  SpO2: 96% 94% 94% 97%  Weight:      Height:        General - Middle aged obese Caucasian male, no acute distress. HEENT - PERRLA, EOMI, atraumatic head, non tender sinuses. Lung - Clear, basal rales, rhonchi, wheezes. Heart - S1, S2 heard, no murmurs, rubs, trace pedal edema. Abdomen - Soft, non tender, bowel sounds good Neuro - Alert, awake and oriented x 3, non focal exam. Skin - Warm and dry. Surgical scar over back clean.  Data Reviewed:      Latest Ref Rng & Units 04/15/2023    3:34 AM 04/13/2023   12:05 AM 04/21/2017    4:05 PM  CBC  WBC 4.0 - 10.5 K/uL 15.0  9.2  7.8   Hemoglobin 13.0 - 17.0 g/dL 84.7  84.0  84.9   Hematocrit 39.0 - 52.0 % 43.5  46.7  43.7   Platelets 150 - 400 K/uL 205  198  172       Latest Ref Rng & Units 04/15/2023    3:34 AM 04/13/2023   12:05 AM 04/21/2017    4:05 PM  BMP  Glucose 70 - 99 mg/dL 887  809  887   BUN 6 - 20 mg/dL 22  14  13    Creatinine 0.61 - 1.24 mg/dL 8.94  1.01  0.93   Sodium 135 - 145 mmol/L 136  133  138   Potassium 3.5 - 5.1 mmol/L 3.9  3.4  3.7   Chloride 98 - 111 mmol/L 97  97  101   CO2 22 - 32 mmol/L 25  22  28    Calcium 8.9 - 10.3 mg/dL 9.6  9.6  9.4    DG Lumbar Spine 2-3 Views Result Date: 04/14/2023 CLINICAL DATA:  [id number] Surgery, elective [id number] EXAM: LUMBAR SPINE - 2-3 VIEW COMPARISON:  04/12/2023 FINDINGS: A single C-arm fluoroscopic image was obtained intraoperatively and submitted for post operative interpretation. Surgical hardware is evident at the L4-L5 disc level. 2 seconds of fluoroscopy time utilized. Radiation dose: 2.47  mGy. Please see the performing provider's procedural report for further detail. IMPRESSION: Intraoperative fluoroscopy for lumbar spine surgery. Electronically Signed   By: Mabel Converse D.O.   On: 04/14/2023 14:03   DG C-Arm 1-60 Min-No Report Result Date: 04/14/2023 Fluoroscopy was utilized by the requesting physician.  No radiographic interpretation.   Family Communication: Discussed with patient, he understand and agree. All questions answereed.  Disposition: Status is: inpatient PT/ OT evaluation for dispo.  Planned Discharge Destination: Home     Time spent: 36 minutes  Author: Concepcion Riser, MD 04/15/2023 2:56 PM Secure chat 7am to 7pm For on call review [email or web address].

## 2023-04-15 NOTE — Progress Notes (Signed)
 Mobility Specialist - Progress Note   04/15/23 1531  Mobility  Activity Ambulated with assistance to bathroom;Ambulated with assistance in hallway  Level of Assistance Standby assist, set-up cues, supervision of patient - no hands on  Assistive Device Front wheel Towles  Distance Ambulated (ft) 40 ft  Activity Response Tolerated well  Mobility visit 1 Mobility     Pt lying in bed upon arrival, utilizing RA. Pt agreeable to activity. Completed bed mobility modI. STS and ambulation with supervision. No LOB. Back pain 6/10. Pt ambulated just outside of door before returning to room. Ambulated to bathroom prior to return to bed. Pt left EOB with needs in reach, family at bedside.    Lennette Seip Mobility Specialist 04/15/23, 3:34 PM

## 2023-04-15 NOTE — Evaluation (Signed)
 Occupational Therapy Evaluation Patient Details Name: Thomas Cole MRN: [id number] DOB: 1970-05-14 Today's Date: 04/15/2023   History of Present Illness Pt is a 53 y/o M admitted on 04/12/23 after presenting with c/o back pain progressively worsening x 2 days. MRI showed stenosis worsened by epidural lipomatosis and also has disc herniations causing worsening stenosis. Pt underwent R sided L4-5 microdiscectomy for herniated disc on 04/14/23. PMH: SCI, chronic neck & back pain, lumbar decompressive sx, cervical spine sx, HTN, DM, asthma, depression, anxiety, seizure, PTSD, obesity, OSA not on CPAP, migraine   Clinical Impression   Thomas Cole was seen for OT evaluation this date. Prior to hospital admission, pt was MOD I using rollator. Pt lives alone. Pt currently requires SUPERVISION + RW for toilet t/f and hand hygiene in standing. MOD I don underwear in figure 4 position. Educated on functional application of back precautions. Pt would benefit from skilled OT to address noted impairments and functional limitations (see below for any additional details). Upon hospital discharge, recommend OT follow up.    If plan is discharge home, recommend the following: A little help with walking and/or transfers;A little help with bathing/dressing/bathroom;Help with stairs or ramp for entrance    Functional Status Assessment  Patient has had a recent decline in their functional status and demonstrates the ability to make significant improvements in function in a reasonable and predictable amount of time.  Equipment Recommendations  BSC/3in1    Recommendations for Other Services       Precautions / Restrictions Precautions Precautions: Fall;Back Restrictions Weight Bearing Restrictions Per Provider Order: No      Mobility Bed Mobility Overal bed mobility: Modified Independent                  Transfers Overall transfer level: Needs assistance Equipment used: Rolling Chiquito (2  wheels) Transfers: Sit to/from Stand Sit to Stand: Supervision, From elevated surface                  Balance Overall balance assessment: Needs assistance Sitting-balance support: Feet supported Sitting balance-[name] Scale: Good     Standing balance support: No upper extremity supported, During functional activity Standing balance-[name] Scale: Fair                             ADL either performed or assessed with clinical judgement   ADL Overall ADL's : Needs assistance/impaired                                       General ADL Comments: SUPERVISION + RW for toilet t/f and hand hygiene in standing. MOD I don underwear in figure 4 position      Pertinent Vitals/Pain Pain Assessment Pain Assessment: 0-10 Pain Score: 6  Pain Location: back, incision Pain Descriptors / Indicators: Discomfort, Sore Pain Intervention(s): Limited activity within patient's tolerance, Repositioned, Premedicated before session     Extremity/Trunk Assessment Upper Extremity Assessment Upper Extremity Assessment: Overall WFL for tasks assessed   Lower Extremity Assessment Lower Extremity Assessment: Generalized weakness   Cervical / Trunk Assessment Cervical / Trunk Assessment: Back Surgery   Communication Communication Communication: No apparent difficulties   Cognition Arousal: Alert Behavior During Therapy: WFL for tasks assessed/performed Overall Cognitive Status: Within Functional Limits for tasks assessed  Home Living Family/patient expects to be discharged to:: Private residence Living Arrangements: Alone Available Help at Discharge: Family;Friend(s);Neighbor;Available PRN/intermittently Type of Home: House Home Access: Stairs to enter [employer] of Steps: 2-3 Entrance Stairs-Rails: Right Home Layout: One level               Home Equipment: Agricultural Consultant  (2 wheels);Rollator (4 wheels);Wheelchair - power;Cane - single point          Prior Functioning/Environment Prior Level of Function : Driving;Independent/Modified Independent             Mobility Comments: Ambulatory with cane or rollator in the house, uses power w/c for community distances, still driving, denies falls.          OT Problem List: Decreased strength;Decreased range of motion;Decreased activity tolerance;Impaired balance (sitting and/or standing)      OT Treatment/Interventions: Self-care/ADL training;Therapeutic exercise;Energy conservation;DME and/or AE instruction;Therapeutic activities    OT Goals(Current goals can be found in the care plan section) Acute Rehab OT Goals Patient Stated Goal: to go home OT Goal Formulation: With patient Time For Goal Achievement: 04/29/23 Potential to Achieve Goals: Good ADL Goals Pt Will Perform Grooming: Independently;standing Pt Will Perform Lower Body Dressing: Independently;sit to/from stand Pt Will Transfer to Toilet: Independently;ambulating;regular height toilet  OT Frequency: Min 1X/week    Co-evaluation              AM-PAC OT 6 Clicks Daily Activity     Outcome Measure Help from another person eating meals?: None Help from another person taking care of personal grooming?: None Help from another person toileting, which includes using toliet, bedpan, or urinal?: A Little Help from another person bathing (including washing, rinsing, drying)?: A Little Help from another person to put on and taking off regular upper body clothing?: None Help from another person to put on and taking off regular lower body clothing?: A Little 6 Click Score: 21   End of Session Equipment Utilized During Treatment: Rolling Florio (2 wheels)  Activity Tolerance: Patient tolerated treatment well Patient left: in bed;with call bell/phone within reach  OT Visit Diagnosis: Other abnormalities of gait and mobility (R26.89)                 Time: 8871-8850 OT Time Calculation (min): 21 min Charges:  OT General Charges $OT Visit: 1 Visit OT Evaluation $OT Eval Moderate Complexity: 1 Mod  Thomas Cole, M.S. OTR/L  04/15/23, 1:18 PM  ascom [phone]

## 2023-04-15 NOTE — Discharge Instructions (Signed)
 Your surgeon has performed an operation on your lumbar spine (low back) to relieve pressure on one or more nerves. Many times, patients feel better immediately after surgery and can "overdo it." Even if you feel well, it is important that you follow these activity guidelines. If you do not let your back heal properly from the surgery, you can increase the chance of a disc herniation and/or return of your symptoms. The following are instructions to help in your recovery once you have been discharged from the hospital.  * It is ok to take NSAIDs after surgery.  Activity    No bending, lifting, or twisting ("BLT"). Avoid lifting objects heavier than 10 pounds (gallon milk jug).  Where possible, avoid household activities that involve lifting, bending, pushing, or pulling such as laundry, vacuuming, grocery shopping, and childcare. Try to arrange for help from friends and family for these activities while your back heals.  Increase physical activity slowly as tolerated.  Taking short walks is encouraged, but avoid strenuous exercise. Do not jog, run, bicycle, lift weights, or participate in any other exercises unless specifically allowed by your doctor. Avoid prolonged sitting, including car rides.  Talk to your doctor before resuming sexual activity.  You should not drive until cleared by your doctor.  Until released by your doctor, you should not return to work or school.  You should rest at home and let your body heal.   You may shower three days after your surgery.  After showering, lightly dab your incision dry. Do not take a tub bath or go swimming for 3 weeks, or until approved by your doctor at your follow-up appointment.  If you smoke, we strongly recommend that you quit.  Smoking has been proven to interfere with normal healing in your back and will dramatically reduce the success rate of your surgery. Please contact [facility] (800-QUIT-NOW) and use the resources at www.[facility].com for  assistance in stopping smoking.  Surgical Incision   If you have a dressing on your incision, you may remove it three days after your surgery. Keep your incision area clean and dry.  If you have staples or stitches on your incision, you should have a follow up scheduled for removal. If you do not have staples or stitches, you will have steri-strips (small pieces of surgical tape) or Dermabond glue. The steri-strips/glue should begin to peel away within about a week (it is fine if the steri-strips fall off before then). If the strips are still in place one week after your surgery, you may gently remove them.  Diet            You may return to your usual diet. Be sure to stay hydrated.  When to Contact us  Although your surgery and recovery will likely be uneventful, you may have some residual numbness, aches, and pains in your back and/or legs. This is normal and should improve in the next few weeks.  However, should you experience any of the following, contact us immediately: New numbness or weakness Pain that is progressively getting worse, and is not relieved by your pain medications or rest Bleeding, redness, swelling, pain, or drainage from surgical incision Chills or flu-like symptoms Fever greater than 101.0 F (38.3 C) Problems with bowel or bladder functions Difficulty breathing or shortness of breath Warmth, tenderness, or swelling in your calf  Contact Information How to contact us:  If you have any questions/concerns before or after surgery, you can reach [place] at [phone], or you can  send a [employer]. We can be reached by phone or mychart 8am-4pm, Monday-Friday.  *Please note: Calls after 4pm are forwarded to a third party answering service. Mychart messages are not routinely monitored during evenings, weekends, and holidays. Please call our office to contact the answering service for urgent concerns during non-business hours.

## 2023-04-16 DIAGNOSIS — E119 Type 2 diabetes mellitus without complications: Secondary | ICD-10-CM | POA: Diagnosis not present

## 2023-04-16 DIAGNOSIS — I10 Essential (primary) hypertension: Secondary | ICD-10-CM | POA: Diagnosis not present

## 2023-04-16 DIAGNOSIS — M48061 Spinal stenosis, lumbar region without neurogenic claudication: Secondary | ICD-10-CM | POA: Diagnosis not present

## 2023-04-16 DIAGNOSIS — M5106 Intervertebral disc disorders with myelopathy, lumbar region: Secondary | ICD-10-CM

## 2023-04-16 LAB — CBC
HCT: 43.9 % (ref 39.0–52.0)
Hemoglobin: 15.3 g/dL (ref 13.0–17.0)
MCH: 30.4 pg (ref 26.0–34.0)
MCHC: 34.9 g/dL (ref 30.0–36.0)
MCV: 87.1 fL (ref 80.0–100.0)
Platelets: 203 10*3/uL (ref 150–400)
RBC: 5.04 MIL/uL (ref 4.22–5.81)
RDW: 12.8 % (ref 11.5–15.5)
WBC: 13.8 10*3/uL — ABNORMAL HIGH (ref 4.0–10.5)
nRBC: 0 % (ref 0.0–0.2)

## 2023-04-16 LAB — BASIC METABOLIC PANEL
Anion gap: 12 (ref 5–15)
BUN: 25 mg/dL — ABNORMAL HIGH (ref 6–20)
CO2: 25 mmol/L (ref 22–32)
Calcium: 9.5 mg/dL (ref 8.9–10.3)
Chloride: 98 mmol/L (ref 98–111)
Creatinine, Ser: 0.99 mg/dL (ref 0.61–1.24)
GFR, Estimated: >60 mL/min (ref >60)
Glucose, Bld: 113 mg/dL — ABNORMAL HIGH (ref 70–99)
Potassium: 3.5 mmol/L (ref 3.5–5.1)
Sodium: 135 mmol/L (ref 135–145)

## 2023-04-16 LAB — GLUCOSE, CAPILLARY
Glucose-Capillary: 97 mg/dL (ref 70–99)
Glucose-Capillary: 150 mg/dL — ABNORMAL HIGH (ref 70–99)

## 2023-04-16 NOTE — Plan of Care (Signed)
  Problem: Education: Goal: Ability to describe self-care measures that may prevent or decrease complications (Diabetes Survival Skills Education) will improve Outcome: Progressing Goal: Individualized Educational Video(s) Outcome: Progressing   Problem: Coping: Goal: Ability to adjust to condition or change in health will improve Outcome: Progressing   Problem: Fluid Volume: Goal: Ability to maintain a balanced intake and output will improve Outcome: Progressing   Problem: Health Behavior/Discharge Planning: Goal: Ability to identify and utilize available resources and services will improve Outcome: Progressing Goal: Ability to manage health-related needs will improve Outcome: Progressing   Problem: Metabolic: Goal: Ability to maintain appropriate glucose levels will improve Outcome: Progressing   Problem: Nutritional: Goal: Maintenance of adequate nutrition will improve Outcome: Progressing Goal: Progress toward achieving an optimal weight will improve Outcome: Progressing   Problem: Skin Integrity: Goal: Risk for impaired skin integrity will decrease Outcome: Progressing   Problem: Tissue Perfusion: Goal: Adequacy of tissue perfusion will improve Outcome: Progressing   Problem: Education: Goal: Knowledge of General Education information will improve Description: Including pain rating scale, medication(s)/side effects and non-pharmacologic comfort measures Outcome: Progressing   Problem: Health Behavior/Discharge Planning: Goal: Ability to manage health-related needs will improve Outcome: Progressing   Problem: Clinical Measurements: Goal: Ability to maintain clinical measurements within normal limits will improve Outcome: Progressing Goal: Will remain free from infection Outcome: Progressing

## 2023-04-16 NOTE — Discharge Summary (Signed)
 Physician Discharge Summary   Patient: Thomas Cole MRN: [id number] DOB: 06-02-1970  Admit date:     04/12/2023  Discharge date: 04/16/23  Discharge Physician: Concepcion Riser   PCP: Diedra Lame, MD   Recommendations at discharge:    PCP follow up in 1 week. Neurosurgery follow up as scheduled.  Discharge Diagnoses: Principal Problem:   Spinal stenosis of lumbar region with radiculopathy Active Problems:   Asthma, mild intermittent   HTN (hypertension)   Diabetes mellitus without complication (HCC)   History of seizures   Depression with anxiety   Obesity (BMI 30-39.9)   Foraminal stenosis due to intervertebral disc disease   Weakness of right lower extremity   Spinal stenosis   Intervertebral lumbar disc disorder with myelopathy, lumbar region  Resolved Problems:   * No resolved hospital problems. *  Hospital Course: Thomas Cole is a 53 y.o. male with medical history significant of  spinal cord injury, chronic neck and back pain, remote history of  lumbar decompressive surgery and cervical spine surgery, HTN, DM, asthma, depression with anxiety, seizure, PTSD, obesity, OSA not on CPAP, migraine, who presents with back pain.    MRI L spine showed - Right subarticular disc extrusion with superior migration and probable sequestration at L4-5. Findings superimposed on additional multifactorial degenerative changes at this level resultant severe canal with bilateral subarticular stenosis, with severe right and moderate to severe left L4 foraminal narrowing. Finding could contribute to right-sided radicular symptoms.   Patient is admitted to the hospitalist service for pain control, neurosurgery consultation. He had right sided L4-5 microdiscectomy for herniated disc 04/14/23. He sis work with PT advised HHPT. He is hemodynamically stable to be discharged home. I advised to follow up with PCP in 1 week, pain clinic and neurosurgery as scheduled  Assessment and  Plan: Spinal stenosis Lumbar radiculopathy S/p right sided L4-5 microdiscectomy for herniated disc 04/14/23. Appreciated neurosurgery follow up. Tapered off prednisone . He goes to pain clinic for opiates, advised to follow up as scheduled. PT/ OT advised HH service.   Asthma, mild intermittent- Continue bronchodilators. As needed Mucinex .   Diabetes mellitus type 2: Well-controlled, A1c 6.1. Home dose metformin resumed. Advised diabetes diet.   Seizure disorder Continue home dose Lamictal , Dilantin  home dose. On Sinemet  for suspicion of parkinsonism.   Obesity with BMI 37.5: Diet, exercise and weight reduction advised.       Consultants: Neurosurgery Procedures performed: right sided L4-5 microdiscectomy for herniated disc 04/14/23.  Disposition: Home Diet recommendation:  Discharge Diet Orders (From admission, onward)     Start     Ordered   04/16/23 0000  Diet - low sodium heart healthy        04/16/23 1040           Cardiac diet DISCHARGE MEDICATION: Allergies as of 04/16/2023       Reactions   Amitriptyline Other (See Comments)   Felt really bad- psychologically   Bee Venom Itching, Swelling   Chlorhexidine Itching   REACTION TO WIPES/ CLOTHES ONLY==he can tolerate the SCRUB LIQUID   Carbamazepine Itching, Rash   Meloxicam Itching, Rash   Morphine  And Codeine Anxiety   Makes him restless  Makes him restless    Sulfa Antibiotics Itching, Rash, Other (See Comments)        Medication List     TAKE these medications    carbidopa -levodopa  50-200 MG tablet Commonly known as: SINEMET  CR Take 1 tablet by mouth at bedtime.   carbidopa -levodopa  25-100 MG  tablet Commonly known as: SINEMET  IR TAKE 2 TABLETS BY MOUTH 3 (THREE) TIMES DAILY   clonazePAM  1 MG tablet Commonly known as: KLONOPIN  Take 1 mg by mouth at bedtime as needed.   cyanocobalamin  1000 MCG tablet Commonly known as: VITAMIN B12 Take 1,000 mcg by mouth daily.   DULoxetine  60 MG  capsule Commonly known as: Cymbalta  Take 1 capsule (60 mg total) by mouth daily after breakfast.   furosemide  20 MG tablet Commonly known as: LASIX  Take 20 mg by mouth daily.   hydrochlorothiazide  25 MG tablet Commonly known as: HYDRODIURIL  Take 25 mg by mouth daily.   HYDROcodone -acetaminophen  10-325 MG tablet Commonly known as: NORCO Take 1 tablet by mouth every 8 (eight) hours as needed. For chronic pain syndrome. Each Rx to last 30 days. What changed: Another medication with the same name was removed. Continue taking this medication, and follow the directions you see here.   lamoTRIgine  25 MG tablet Commonly known as: LAMICTAL  Take 25 mg by mouth at bedtime. Titration schedule   losartan  100 MG tablet Commonly known as: COZAAR  Take 100 mg by mouth daily.   metFORMIN 500 MG tablet Commonly known as: GLUCOPHAGE Take 500 mg by mouth daily after lunch.   metoprolol  succinate 25 MG 24 hr tablet Commonly known as: TOPROL -XL Take 12.5 mg by mouth daily.   nortriptyline  25 MG capsule Commonly known as: PAMELOR  Take 2 capsules (50 mg total) by mouth at bedtime.   omeprazole  40 MG capsule Commonly known as: PRILOSEC Take 1 capsule by mouth 2 (two) times daily.   phenytoin  100 MG ER capsule Commonly known as: DILANTIN  Take 100 mg by mouth. 2 in a.m. 3 at night   pregabalin  100 MG capsule Commonly known as: Lyrica  Take 1 capsule (100 mg total) by mouth 3 (three) times daily.   sildenafil 100 MG tablet Commonly known as: VIAGRA 1/2 to 1 tab daily as needed prior to sex   tiZANidine  4 MG tablet Commonly known as: ZANAFLEX  Take 4 mg by mouth 3 (three) times daily.   Vitamin D3 25 MCG (1000 UT) Caps Take 1 capsule by mouth daily.        Follow-up Information     Ulis Bottcher, PA-C Follow up on 04/29/2023.   Specialty: Physician Assistant Contact information: [address] [place], [address] [place] [address] [phone]         Diedra Lame, MD  Follow up in 1 week(s).   Specialty: Family Medicine Contact information: [address] [facility] [place] [address] [phone]                Discharge Exam: Fredricka Weights   04/12/23 1837  Weight: 136.1 kg   General - Middle aged obese Caucasian male, no acute distress. HEENT - PERRLA, EOMI, atraumatic head, non tender sinuses. Lung - Clear, basal rales, rhonchi, wheezes. Heart - S1, S2 heard, no murmurs, rubs, trace pedal edema. Abdomen - Soft, non tender, bowel sounds good Neuro - Alert, awake and oriented x 3, non focal exam. Skin - Warm and dry. Surgical scar over back clean.  Condition at discharge: stable  The results of significant diagnostics from this hospitalization (including imaging, microbiology, ancillary and laboratory) are listed below for reference.   Imaging Studies: DG Lumbar Spine 2-3 Views Result Date: 04/14/2023 CLINICAL DATA:  [id number] Surgery, elective [id number] EXAM: LUMBAR SPINE - 2-3 VIEW COMPARISON:  04/12/2023 FINDINGS: A single C-arm fluoroscopic image was obtained intraoperatively  and submitted for post operative interpretation. Surgical hardware is evident at the L4-L5 disc level. 2 seconds of fluoroscopy time utilized. Radiation dose: 2.47 mGy. Please see the performing provider's procedural report for further detail. IMPRESSION: Intraoperative fluoroscopy for lumbar spine surgery. Electronically Signed   By: Mabel Converse D.O.   On: 04/14/2023 14:03   DG C-Arm 1-60 Min-No Report Result Date: 04/14/2023 Fluoroscopy was utilized by the requesting physician.  No radiographic interpretation.   MR Lumbar Spine Wo Contrast Result Date: 04/12/2023 CLINICAL DATA:  Initial evaluation for acute right lower back pain with radiation into the right lower extremity. EXAM: MRI LUMBAR SPINE WITHOUT CONTRAST TECHNIQUE: Multiplanar, multisequence MR imaging of the lumbar spine was performed. No intravenous  contrast was administered. COMPARISON:  Prior MRI from 04/28/2021. FINDINGS: Segmentation: Standard. Lowest well-formed disc space labeled the L5-S1 level. Alignment: Trace degenerative retrolisthesis of L2 on L3 and L3 on L4. Alignment otherwise normal preservation of the normal lumbar lordosis. Vertebrae: Vertebral body height maintained without acute or chronic fracture. Bone marrow signal intensity within normal limits. No worrisome osseous lesions. Degenerative reactive endplate changes present about the L3-4 and L4-5 interspaces. Conus medullaris and cauda equina: Conus extends to the L1 level. Conus medullaris within normal limits. Scattered irregularity of the nerve roots of the cauda equina related to distal stenosis noted. Paraspinal and other soft tissues: Paraspinous soft tissues within normal limits. Few scattered T2 hyperintense simple renal cysts noted, benign in appearance, no follow-up imaging recommended. Disc levels: A degree of underlying congenital spinal stenosis noted. L1-2: Normal interspace. Mild facet hypertrophy. No canal or foraminal stenosis. L2-3: Diffuse disc bulge with disc desiccation and intervertebral disc space narrowing. Reactive endplate spurring. Mild to moderate facet hypertrophy. Mild prominence of the dorsal epidural fat. Resultant severe spinal stenosis. Mild to moderate bilateral L2 foraminal narrowing. L3-4: Diffuse disc bulge with disc desiccation. Reactive endplate spurring. Superimposed tiny central disc extrusion with inferior migration. Moderate facet hypertrophy. Prominence of the dorsal epidural fat. Resultant severe spinal stenosis. Mild to moderate left worse than right L3 foraminal stenosis. L4-5: Diffuse disc bulge with disc desiccation. Reactive endplate spurring. Superimposed right subarticular disc extrusion with superior migration and probable sequestration (series 5, image 10). Moderate bilateral facet hypertrophy. Prominence of the dorsal epidural fat.  Resultant severe canal with bilateral subarticular stenosis. Severe right with moderate to severe left L4 foraminal narrowing. L5-S1: Diffuse disc bulge with disc desiccation. Reactive endplate spurring. Superimposed central disc protrusion with annular fissure indents the ventral thecal sac. Prior left hemi laminectomy. Moderate left worse than right facet hypertrophy. No significant spinal stenosis. Moderate left worse than right L5 foraminal narrowing. IMPRESSION: 1. Right subarticular disc extrusion with superior migration and probable sequestration at L4-5. Findings superimposed on additional multifactorial degenerative changes at this level resultant severe canal with bilateral subarticular stenosis, with severe right and moderate to severe left L4 foraminal narrowing. Finding could contribute to right-sided radicular symptoms. 2. Additional multilevel lumbar spondylosis with resultant severe spinal stenosis at L2-3, L3-4, and L4-5. 3. Multifactorial degenerative changes with resultant multilevel foraminal narrowing as above. Notable findings include mild to moderate bilateral L2 and L3 foraminal stenosis, with moderate left worse than right L5 foraminal narrowing. Electronically Signed   By: Morene Hoard M.D.   On: 04/12/2023 21:09    Microbiology: Results for orders placed or performed during the hospital encounter of 10/21/19  SARS CORONAVIRUS 2 (TAT 6-24 HRS) Nasopharyngeal Nasopharyngeal Swab     Status: None   Collection Time: 10/21/19  11:30 AM   Specimen: Nasopharyngeal Swab  Result Value Ref Range Status   SARS Coronavirus 2 NEGATIVE NEGATIVE Final    Comment: (NOTE) SARS-CoV-2 target nucleic acids are NOT DETECTED.  The SARS-CoV-2 RNA is generally detectable in upper and lower respiratory specimens during the acute phase of infection. Negative results do not preclude SARS-CoV-2 infection, do not rule out co-infections with other pathogens, and should not be used as the sole  basis for treatment or other patient management decisions. Negative results must be combined with clinical observations, patient history, and epidemiological information. The expected result is Negative.  Fact Sheet for Patients: hairslick.no  Fact Sheet for Healthcare Providers: quierodirigir.com  This test is not yet approved or cleared by the [place]  FDA and  has been authorized for detection and/or diagnosis of SARS-CoV-2 by FDA under an Emergency Use Authorization (EUA). This EUA will remain  in effect (meaning this test can be used) for the duration of the COVID-19 declaration under Se ction 564(b)(1) of the Act, 21 U.S.C. section 360bbb-3(b)(1), unless the authorization is terminated or revoked sooner.  Performed at [facility], [address], [place], [place] [address]     Labs: CBC: Recent Labs  Lab 04/13/23 0005 04/15/23 0334 04/16/23 0505  WBC 9.2 15.0* 13.8*  HGB 15.9 15.2 15.3  HCT 46.7 43.5 43.9  MCV 89.0 86.5 87.1  PLT 198 205 203   Basic Metabolic Panel: Recent Labs  Lab 04/13/23 0005 04/15/23 0334 04/16/23 0505  NA 133* 136 135  K 3.4* 3.9 3.5  CL 97* 97* 98  CO2 22 25 25   GLUCOSE 190* 112* 113*  BUN 14 22* 25*  CREATININE 1.01 1.05 0.99  CALCIUM 9.6 9.6 9.5   Liver Function Tests: No results for input(s): AST, ALT, ALKPHOS, BILITOT, PROT, ALBUMIN  in the last 168 hours. CBG: Recent Labs  Lab 04/15/23 0737 04/15/23 1218 04/15/23 1701 04/15/23 2051 04/16/23 0802  GLUCAP 108* 174* 188* 142* 97    Discharge time spent: 34 minutes.  Signed: Concepcion Riser, MD [facility] 04/16/2023

## 2023-04-16 NOTE — Progress Notes (Signed)
   Neurosurgery Progress Note  History: Thomas Cole is s/p right L4-5 microdiscectomy  POD2: Continues to feel improvement in his legs this morning.  Reports some muscle twitching in the posterior aspect of both of his legs but this is not painful.  Feels his right foot strength has improved. POD1: Improved leg pain overnight   Physical Exam: Vitals:   04/15/23 2324 04/16/23 0801  BP: 113/72 129/73  Pulse: 90 82  Resp: 16 16  Temp: 98.1 F (36.7 C) 97.9 F (36.6 C)  SpO2: 95% 96%    AA Ox3 CNI  Strength:5/5 throughout except 4+ right EHL   Data:  Other tests/results: NA  Assessment/Plan:  Thomas Cole is a 53 y.o presenting with acute right sided radiculopathy and weakness s/o right L4-5 microdiscectomy.   - mobilize - pain control - DVT prophylaxis - PTOT; cleared from neurosurgical standpoint for d/c  - post-op follow up set up and loaded into d/c paperwork along with discharge instructions which were reviewed with the patient - Please feel free to reengage neurosurgery during this hospitalization if needed. We will otherwise see him output as scheduled.  Edsel Goods PA-C Department of Neurosurgery

## 2023-04-16 NOTE — Plan of Care (Signed)

## 2023-04-16 NOTE — TOC Transition Note (Signed)
 Transition of Care [facility]) - Discharge Note   Patient Details  Name: Thomas Cole MRN: [id number] Date of Birth: [birth year]  Transition of Care [facility]) CM/SW Contact:  Latasha Buczkowski A Kenyada Dosch, RN Phone Number: 04/16/2023, 12:51 PM   Clinical Narrative:    Chart reviewed.  Noted that patient has orders for discharge today.  Noted that PT has recommend Home Health PT/OT on discharge.    I have spoken with Mr. Mcgillicuddy and he was agreeable to Home health PT/OT/  Mr. Beswick did not have a home health preference.  I have asked Aertiva with [facility] to accept home care referral.  Aertiva informs me that [facility] can start service on tomorrow.   Patient reports that he has rolling Meche and a Rolator at home.    I have informed staff nurse of the above information.     Final next level of care: Home w Home Health Services Barriers to Discharge: No Barriers Identified   Patient Goals and CMS Choice   CMS Medicare.gov Compare Post Acute Care list provided to:: Patient Choice offered to / list presented to : Patient [place] ownership interest in [facility].provided to:: Patient    Discharge Placement                    Patient and family notified of of transfer: 04/16/23  Discharge Plan and Services Additional resources added to the After Visit Summary for                            [facility] Arranged: PT, OT HH Agency: [facility] ([facility] Date HH Agency Contacted: 04/16/23   Representative spoke with at [facility] Agency: Jessy  Social Drivers of Health (SDOH) Interventions SDOH Screenings   Food Insecurity: No Food Insecurity (04/15/2023)  Housing: Low Risk  (04/15/2023)  Transportation Needs: No Transportation Needs (04/15/2023)  Utilities: Not At Risk (04/15/2023)  Depression (PHQ2-9): Low Risk  (07/19/2022)  Financial Resource Strain: Medium Risk (01/10/2023)   Received from [facility]  Social Connections: Unknown (08/13/2021)   Received  from [facility]  Tobacco Use: Low Risk  (04/12/2023)     Readmission Risk Interventions     No data to display

## 2023-04-16 NOTE — Care Management Important Message (Signed)
 Important Message  Patient Details  Name: Thomas Cole MRN: [id number] Date of Birth: September 11, 1970   Important Message Given:  Yes - Medicare IM     Cristela Blue, CMA 04/16/2023, 9:09 AM

## 2023-04-19 ENCOUNTER — Other Ambulatory Visit: Payer: Self-pay | Admitting: Student in an Organized Health Care Education/Training Program

## 2023-04-22 ENCOUNTER — Telehealth: Payer: Self-pay | Admitting: Neurosurgery

## 2023-04-22 NOTE — Telephone Encounter (Signed)
Patient is calling to request a refill of the muscle relaxer that was prescribed after surgery. Patient would also like to know when he is able to drive following surgery.   [facility]

## 2023-04-22 NOTE — Telephone Encounter (Signed)
Spoke to patient and he has a pill pack given to him by the pharmacy. In the pill packs has Tizanidine. Patient was not aware this is the muscle relaxer. He is fine with continuing this medication. He will see [place] at his Post op appointment.

## 2023-04-29 ENCOUNTER — Ambulatory Visit (INDEPENDENT_AMBULATORY_CARE_PROVIDER_SITE_OTHER): Payer: Medicare Other | Admitting: Physician Assistant

## 2023-04-29 ENCOUNTER — Encounter: Payer: Self-pay | Admitting: Physician Assistant

## 2023-04-29 VITALS — BP 118/78 | Temp 97.8°F | Ht 75.0 in | Wt 300.0 lb

## 2023-04-29 DIAGNOSIS — M48061 Spinal stenosis, lumbar region without neurogenic claudication: Secondary | ICD-10-CM

## 2023-04-29 DIAGNOSIS — Z09 Encounter for follow-up examination after completed treatment for conditions other than malignant neoplasm: Secondary | ICD-10-CM

## 2023-04-29 DIAGNOSIS — M5416 Radiculopathy, lumbar region: Secondary | ICD-10-CM

## 2023-04-29 MED ORDER — METHYLPREDNISOLONE 4 MG PO TBPK: ORAL_TABLET | ORAL | 0 refills | Status: DC

## 2023-04-29 NOTE — Progress Notes (Signed)
   REFERRING PHYSICIAN:  Kandyce Rud, Md [address] [facility] [place],  [place] [address]  DOS: 04/14/2023, Right L4-5 microdiscectomy  HISTORY OF PRESENT ILLNESS: Thomas Cole is approximately 2 weeks status post right L4-5 microdiscectomy after presenting to the ED for new right-sided radiculopathy and right lower extremity weakness. he is doing well currently.  His leg pain has improved quite a bit.  Currently he is having quite a bit of hip pain switch over the last couple of days.  However denies any new weakness.  He continues taking Norco, Cymbalta, and Lyrica for his pain.    PHYSICAL EXAMINATION:  General: Patient is well developed, well nourished, calm, collected, and in no apparent distress.   NEUROLOGICAL:  General: In no acute distress.   Awake, alert, oriented to person, place, and time.  Pupils equal round and reactive to light.  Facial tone is symmetric.    Strength:            Side Iliopsoas Quads Hamstring PF DF EHL  R 5 5 5 5 5  4+-5  L 5 5 5 5 5 5    Incision c/d/i   ROS (Neurologic):  Negative except as noted above  IMAGING: No new imaging completed today.  ASSESSMENT/PLAN:  Thomas Cole is approximately 2 weeks status post right L4-5 microdiscectomy after presenting to the ED for new right-sided radiculopathy and right lower extremity weakness. he is doing well currently.  His leg pain has improved quite a bit.  Currently he is having quite a bit of hip pain switch over the last couple of days.  However denies any new weakness.  He continues taking Norco, Cymbalta, and Lyrica for his pain.  On examination, patient's incision is well-appearing.  Examination continues to improve.  Pleasure to see patient in clinic today.  I did prescribe him a Medrol Dosepak to help with his pain.  He continues to see his pain medicine doctor as well for his other medications.  -Medrol Dosepak -Physical therapy -Wound care  discussed -Restrictions discussed.  I have advised the patient to lift up to 10 pounds until 6 weeks after surgery, then increase up to 25 pounds until 12 weeks after surgery.  After 12 weeks post-op, the patient advised to increase activity as tolerated.  Plan to see back in 4 weeks at his 6-week postop appointment.  He is encouraged to reach out to [place] before then if he has any questions or concerns  Advised to contact the office if any questions or concerns arise.  Joan Flores PA-C Department of neurosurgery

## 2023-05-09 ENCOUNTER — Ambulatory Visit
Payer: Medicare Other | Attending: Student in an Organized Health Care Education/Training Program | Admitting: Student in an Organized Health Care Education/Training Program

## 2023-05-09 ENCOUNTER — Encounter: Payer: Self-pay | Admitting: Student in an Organized Health Care Education/Training Program

## 2023-05-09 VITALS — BP 84/74 | HR 96 | Temp 98.3°F | Resp 16 | Ht 75.0 in | Wt 300.0 lb

## 2023-05-09 DIAGNOSIS — M5481 Occipital neuralgia: Secondary | ICD-10-CM | POA: Diagnosis present

## 2023-05-09 DIAGNOSIS — M48061 Spinal stenosis, lumbar region without neurogenic claudication: Secondary | ICD-10-CM | POA: Insufficient documentation

## 2023-05-09 DIAGNOSIS — G894 Chronic pain syndrome: Secondary | ICD-10-CM | POA: Diagnosis present

## 2023-05-09 DIAGNOSIS — M542 Cervicalgia: Secondary | ICD-10-CM | POA: Insufficient documentation

## 2023-05-09 DIAGNOSIS — G959 Disease of spinal cord, unspecified: Secondary | ICD-10-CM | POA: Insufficient documentation

## 2023-05-09 DIAGNOSIS — M5414 Radiculopathy, thoracic region: Secondary | ICD-10-CM | POA: Insufficient documentation

## 2023-05-09 DIAGNOSIS — M5412 Radiculopathy, cervical region: Secondary | ICD-10-CM | POA: Diagnosis present

## 2023-05-09 MED ORDER — HYDROCODONE-ACETAMINOPHEN 10-325 MG PO TABS: 1.0000 | ORAL_TABLET | Freq: Three times a day (TID) | ORAL | 0 refills | Status: DC | PRN

## 2023-05-09 MED ORDER — PREGABALIN 100 MG PO CAPS: 100.0000 mg | ORAL_CAPSULE | Freq: Three times a day (TID) | ORAL | 5 refills | Status: DC

## 2023-05-09 NOTE — Progress Notes (Signed)
 PROVIDER NOTE: Information contained herein reflects review and annotations entered in association with encounter. Interpretation of such information and data should be left to medically-trained personnel. Information provided to patient can be located elsewhere in the medical record under Patient Instructions. Document created using STT-dictation technology, any transcriptional errors that may result from process are unintentional.    Patient: Thomas Cole  Service Category: E/M  Provider: Wallie Sherry, MD  DOB: 05-30-1970  DOS: 05/09/2023  Referring Provider: Diedra Lame, MD  MRN: [id number]  Specialty: Interventional Pain Management  PCP: Diedra Lame, MD  Type: Established Patient  Setting: Ambulatory outpatient    Location: Office  Delivery: Face-to-face     HPI  Mr. SHERILL WEGENER, a 53 y.o. year old male, is here today because of his Bilateral occipital neuralgia [M54.81]. Mr. Florek primary complain today is Hip Pain (right) and Neck Pain  Pertinent problems: Mr. Conard has Spondylosis, cervical, with myelopathy; Migraines; History of seizures; Generalized anxiety disorder; and Bilateral occipital neuralgia on their pertinent problem list. Pain Assessment: Severity of Chronic pain is reported as a 5 /10. Location: Hip Right/radiates into outside of right leg and through buttocks. Onset: More than a month ago. Quality: Burning, Sharp. Timing: Constant. Modifying factor(s): meds. Vitals:  height is 6' 3 (1.905 m) and weight is 300 lb (136.1 kg). His temperature is 98.3 F (36.8 C). His blood pressure is 84/74 (abnormal) and his pulse is 96. His respiration is 16 and oxygen saturation is 100%.  BMI: Estimated body mass index is 37.5 kg/m as calculated from the following:   Height as of this encounter: 6' 3 (1.905 m).   Weight as of this encounter: 300 lb (136.1 kg). Last encounter: 02/05/2023. Last procedure: 02/13/2023.  Reason for encounter: medication management.    History  of Present Illness   [name] is a 53 year old male who presents for post-operative follow-up for lumbar spine surgery.  He underwent an unplanned lumbar spine surgery due to severe pain radiating from his hip down to his leg, which became intolerable and led to an emergency room visit. The pain was initially intermittent but intensified to the point where he could not stand on his leg, prompting him to call 911. An MRI revealed nerve compression on the right side, necessitating surgical intervention. During his hospital stay, he was initially treated with anti-inflammatory medications and steroids, which did not alleviate his symptoms. He was admitted for two days, during which neurosurgery was consulted. He recalls the surgery being less invasive compared to a similar procedure he had in 1994, noting a smaller incision and quicker recovery.  Post-surgery, he reports improvement in his ability to walk, although he still experiences intermittent pain in his hip and leg, albeit less severe than before the surgery. He describes the pain prior to surgery as 'so bad that night I was in tears.' He is currently undergoing physical therapy, which has been beneficial in improving his mobility. He is able to walk further distances without a cane, although he notes that physical activity quickly exhausts him, requiring a couple of days to recover his energy.  He is taking hydrocodone , Lyrica , Cymbalta , nortriptyline , and Zanaflex .    He would like to repeat greater occipital nerve block       Pharmacotherapy Assessment  Analgesic: hydrocodone  immediate release 10 mg 3 times daily as needed for breakthrough pain    Monitoring: [place] PMP: PDMP reviewed during this encounter.       Pharmacotherapy: No  side-effects or adverse reactions reported. Compliance: No problems identified. Effectiveness: Clinically acceptable.  Rebecka Wolm HERO, RN  05/09/2023 12:58 PM  Sign when Signing Visit Nursing Pain Medication  Assessment:  Safety precautions to be maintained throughout the outpatient stay will include: orient to surroundings, keep bed in low position, maintain call bell within reach at all times, provide assistance with transfer out of bed and ambulation.  Medication Inspection Compliance: Pill count conducted under aseptic conditions, in front of the patient. Neither the pills nor the bottle was removed from the patient's sight at any time. Once count was completed pills were immediately returned to the patient in their original bottle.  Medication: Hydrocodone /APAP Pill/Patch Count:  47 of 90 pills remain Pill/Patch Appearance: Markings consistent with prescribed medication Bottle Appearance: Standard pharmacy container. Clearly labeled. Filled Date: 01 / 20 / 2025 Last Medication intake:  Today    No results found for: CBDTHCR No results found for: D8THCCBX No results found for: D9THCCBX  UDS:  Summary  Date Value Ref Range Status  10/17/2021 Note  Final    Comment:    ==================================================================== Compliance Drug Analysis, Ur ==================================================================== Test                             Result       Flag       Units  Drug Present and Declared for Prescription Verification   7-aminoclonazepam              96           EXPECTED   ng/mg creat    7-aminoclonazepam is an expected metabolite of clonazepam . Source of    clonazepam  is a scheduled prescription medication.    Hydrocodone                     758          EXPECTED   ng/mg creat   Hydromorphone                   87           EXPECTED   ng/mg creat   Dihydrocodeine                 263          EXPECTED   ng/mg creat   Norhydrocodone                 1508         EXPECTED   ng/mg creat    Sources of hydrocodone  include scheduled prescription medications.    Hydromorphone , dihydrocodeine and norhydrocodone are expected    metabolites of hydrocodone .  Hydromorphone  and dihydrocodeine are    also available as scheduled prescription medications.    Pregabalin                      PRESENT      EXPECTED   Tizanidine                      PRESENT      EXPECTED   Duloxetine                      PRESENT      EXPECTED   Nortriptyline                   PRESENT      EXPECTED  Nortriptyline  may be administered as a prescription drug; it is also    an expected metabolite of amitriptyline.    Acetaminophen                   PRESENT      EXPECTED  Drug Absent but Declared for Prescription Verification   Phenytoin                       Not Detected UNEXPECTED   Metoprolol                      Not Detected UNEXPECTED ==================================================================== Test                      Result    Flag   Units      Ref Range   Creatinine              76               mg/dL      >=79 ==================================================================== Declared Medications:  The flagging and interpretation on this report are based on the  following declared medications.  Unexpected results may arise from  inaccuracies in the declared medications.   **Note: The testing scope of this panel includes these medications:   Clonazepam  (Klonopin )  Duloxetine  (Cymbalta )  Hydrocodone   Hydrocodone  (Norco)  Metoprolol  (Toprol )  Nortriptyline  (Pamelor )  Phenytoin  (Dilantin )  Pregabalin  (Lyrica )   **Note: The testing scope of this panel does not include small to  moderate amounts of these reported medications:   Acetaminophen  (Norco)  Tizanidine  (Zanaflex )   **Note: The testing scope of this panel does not include the  following reported medications:   Carbidopa  (Sinemet )  Cyanocobalamin   Furosemide  (Lasix )  Glipizide  Hydrochlorothiazide  (Hydrodiuril )  Levodopa  (Sinemet )  Losartan  (Cozaar )  Metformin (Glucophage)  Omeprazole  (Prilosec)  Ubrogepant  (Ubrelvy )  Vitamin  D3 ==================================================================== For clinical consultation, please call [phone]. ====================================================================       ROS  Constitutional: Denies any fever or chills Gastrointestinal: No reported hemesis, hematochezia, vomiting, or acute GI distress Musculoskeletal: Denies any acute onset joint swelling, redness, loss of ROM, or weakness Neurological:  occipital neuralgia  Medication Review  DULoxetine , HYDROcodone -acetaminophen , Vitamin D3, carbidopa -levodopa , clonazePAM , cyanocobalamin , furosemide , hydrochlorothiazide , lamoTRIgine , losartan , metFORMIN, metoprolol  succinate, nortriptyline , omeprazole , pregabalin , sildenafil, and tiZANidine   History Review  Allergy: Mr. Degollado is allergic to amitriptyline, bee venom, chlorhexidine, carbamazepine, hm lidocaine  patch [lidocaine ], meloxicam, morphine  and codeine, and sulfa antibiotics. Drug: Mr. Perlmutter  reports no history of drug use. Alcohol:  reports no history of alcohol use. Tobacco:  reports that he has never smoked. He has never used smokeless tobacco. Social: Mr. Mehring  reports that he has never smoked. He has never used smokeless tobacco. He reports that he does not drink alcohol and does not use drugs. Medical:  has a past medical history of Anxiety, Asthma, Back problem, Benign essential hypertension (11/30/2016), Cervical spondylosis without myelopathy (09/17/2012), Complication of anesthesia, Depression, Diabetes mellitus without complication (HCC) (02/2020), GERD (gastroesophageal reflux disease), Hypertension, Labile hypertension (03/19/2013), Major depressive disorder, recurrent episode, severe (HCC) (06/23/2015), Migraine headache, Migraines (11/11/2013), S/P appendectomy (04/09/2011), Seizure (HCC) (11/30/2016), Seizures (HCC), Shortness of breath, and SOB (shortness of breath) (08/06/2014). Surgical: Mr. Nall  has a past surgical history that includes  Knee surgery; Posterior fusion cervical spine; Cervical disc surgery; Appendectomy (12); Anterior cervical corpectomy (N/A, 09/15/2012); Cholecystectomy (N/A, 03/29/2017); Colonoscopy with propofol  (N/A, 10/23/2019); Esophagogastroduodenoscopy (egd) with propofol  (N/A, 10/23/2019);  and Lumbar laminectomy/decompression microdiscectomy (Right, 04/14/2023). Family: family history includes Arthritis in his father, maternal grandfather, maternal grandmother, mother, paternal grandfather, and paternal grandmother; Asthma in his mother; Coronary artery disease in his father and mother; Diabetes in his father, paternal grandfather, and paternal grandmother; Heart disease in his father, maternal grandfather, maternal grandmother, paternal grandfather, and paternal grandmother; Hypertension in his maternal grandfather, maternal grandmother, mother, paternal grandfather, and paternal grandmother; Mental illness in his mother.  Laboratory Chemistry Profile   Renal Lab Results  Component Value Date   BUN 25 (H) 04/16/2023   CREATININE 0.99 04/16/2023   LABCREA 79.3 03/05/2014   GFR 82.19 08/05/2014   GFRAA >60 04/21/2017   GFRNONAA >60 04/16/2023    Hepatic Lab Results  Component Value Date   AST 30 04/21/2017   ALT 23 04/21/2017   ALBUMIN  4.2 04/21/2017   ALKPHOS 86 04/21/2017   LIPASE 51 04/21/2017    Electrolytes Lab Results  Component Value Date   NA 135 04/16/2023   K 3.5 04/16/2023   CL 98 04/16/2023   CALCIUM 9.5 04/16/2023   MG 1.9 04/26/2020    Bone Lab Results  Component Value Date   VD25OH 31.95 03/05/2016   25OHVITD1 35 04/26/2020   25OHVITD2 <1.0 04/26/2020   25OHVITD3 35 04/26/2020    Inflammation (CRP: Acute Phase) (ESR: Chronic Phase) Lab Results  Component Value Date   CRP 10 04/26/2020   ESRSEDRATE 22 04/26/2020         Note: Above Lab results reviewed.  Recent Imaging Review  DG Lumbar Spine 2-3 Views CLINICAL DATA:  [id number] Surgery, elective  [id number]  EXAM: LUMBAR SPINE - 2-3 VIEW  COMPARISON:  04/12/2023  FINDINGS: A single C-arm fluoroscopic image was obtained intraoperatively and submitted for post operative interpretation. Surgical hardware is evident at the L4-L5 disc level. 2 seconds of fluoroscopy time utilized. Radiation dose: 2.47 mGy. Please see the performing provider's procedural report for further detail.  IMPRESSION: Intraoperative fluoroscopy for lumbar spine surgery.  Electronically Signed   By: Mabel Converse D.O.   On: 04/14/2023 14:03 DG C-Arm 1-60 Min-No Report Fluoroscopy was utilized by the requesting physician.  No radiographic  interpretation.  Note: Reviewed        Physical Exam  General appearance: Well nourished, well developed, and well hydrated. In no apparent acute distress Mental status: Alert, oriented x 3 (person, place, & time)       Respiratory: No evidence of acute respiratory distress Eyes: PERLA Vitals: BP (!) 84/74   Pulse 96   Temp 98.3 F (36.8 C)   Resp 16   Ht 6' 3 (1.905 m)   Wt 300 lb (136.1 kg)   SpO2 100%   BMI 37.50 kg/m  BMI: Estimated body mass index is 37.5 kg/m as calculated from the following:   Height as of this encounter: 6' 3 (1.905 m).   Weight as of this encounter: 300 lb (136.1 kg). Ideal: Ideal body weight: 84.5 kg (186 lb 4.6 oz) Adjusted ideal body weight: 105.1 kg (231 lb 12.4 oz)  Assessment   Diagnosis Status  1. Bilateral occipital neuralgia   2. Thoracic radiculopathy   3. Cervicalgia   4. Cervical radicular pain   5. Neuroforaminal stenosis of lumbar spine (L3,4,5)   6. Chronic pain syndrome   7. Myelopathy of cervical spinal cord with cervical radiculopathy (HCC)    Controlled Controlled Controlled   Updated Problems: No problems updated.  1. Bilateral occipital neuralgia (Primary) - GREATER OCCIPITAL NERVE  BLOCK; Future  2. Thoracic radiculopathy - HYDROcodone -acetaminophen  (NORCO) 10-325 MG tablet; Take 1 tablet by  mouth every 8 (eight) hours as needed. For chronic pain syndrome. Each Rx to last 30 days.  Dispense: 90 tablet; Refill: 0 - HYDROcodone -acetaminophen  (NORCO) 10-325 MG tablet; Take 1 tablet by mouth every 8 (eight) hours as needed. For chronic pain syndrome. Each Rx to last 30 days.  Dispense: 90 tablet; Refill: 0 - HYDROcodone -acetaminophen  (NORCO) 10-325 MG tablet; Take 1 tablet by mouth every 8 (eight) hours as needed. For chronic pain syndrome. Each Rx to last 30 days.  Dispense: 90 tablet; Refill: 0  3. Cervicalgia  4. Cervical radicular pain  5. Neuroforaminal stenosis of lumbar spine (L3,4,5)  6. Chronic pain syndrome - HYDROcodone -acetaminophen  (NORCO) 10-325 MG tablet; Take 1 tablet by mouth every 8 (eight) hours as needed. For chronic pain syndrome. Each Rx to last 30 days.  Dispense: 90 tablet; Refill: 0 - HYDROcodone -acetaminophen  (NORCO) 10-325 MG tablet; Take 1 tablet by mouth every 8 (eight) hours as needed. For chronic pain syndrome. Each Rx to last 30 days.  Dispense: 90 tablet; Refill: 0 - HYDROcodone -acetaminophen  (NORCO) 10-325 MG tablet; Take 1 tablet by mouth every 8 (eight) hours as needed. For chronic pain syndrome. Each Rx to last 30 days.  Dispense: 90 tablet; Refill: 0 - pregabalin  (LYRICA ) 100 MG capsule; Take 1 capsule (100 mg total) by mouth 3 (three) times daily.  Dispense: 90 capsule; Refill: 5  7. Myelopathy of cervical spinal cord with cervical radiculopathy (HCC) - HYDROcodone -acetaminophen  (NORCO) 10-325 MG tablet; Take 1 tablet by mouth every 8 (eight) hours as needed. For chronic pain syndrome. Each Rx to last 30 days.  Dispense: 90 tablet; Refill: 0 - HYDROcodone -acetaminophen  (NORCO) 10-325 MG tablet; Take 1 tablet by mouth every 8 (eight) hours as needed. For chronic pain syndrome. Each Rx to last 30 days.  Dispense: 90 tablet; Refill: 0 - HYDROcodone -acetaminophen  (NORCO) 10-325 MG tablet; Take 1 tablet by mouth every 8 (eight) hours as needed. For  chronic pain syndrome. Each Rx to last 30 days.  Dispense: 90 tablet; Refill: 0     Mr. HANSEN CARINO has a current medication list which includes the following long-term medication(s): carbidopa -levodopa , carbidopa -levodopa , clonazepam , duloxetine , metformin, nortriptyline , sildenafil, [START ON 05/15/2023] hydrocodone -acetaminophen , [START ON 06/14/2023] hydrocodone -acetaminophen , [START ON 07/14/2023] hydrocodone -acetaminophen , lamotrigine , metoprolol  succinate, and pregabalin .  Pharmacotherapy (Medications Ordered): Meds ordered this encounter  Medications   HYDROcodone -acetaminophen  (NORCO) 10-325 MG tablet    Sig: Take 1 tablet by mouth every 8 (eight) hours as needed. For chronic pain syndrome. Each Rx to last 30 days.    Dispense:  90 tablet    Refill:  0   HYDROcodone -acetaminophen  (NORCO) 10-325 MG tablet    Sig: Take 1 tablet by mouth every 8 (eight) hours as needed. For chronic pain syndrome. Each Rx to last 30 days.    Dispense:  90 tablet    Refill:  0   HYDROcodone -acetaminophen  (NORCO) 10-325 MG tablet    Sig: Take 1 tablet by mouth every 8 (eight) hours as needed. For chronic pain syndrome. Each Rx to last 30 days.    Dispense:  90 tablet    Refill:  0   pregabalin  (LYRICA ) 100 MG capsule    Sig: Take 1 capsule (100 mg total) by mouth 3 (three) times daily.    Dispense:  90 capsule    Refill:  5    Do not place this medication, or any other prescription from our practice,  on Automatic Refill. Patient may have prescription filled one day early if pharmacy is closed on scheduled refill date.   Orders:  Orders Placed This Encounter  Procedures   GREATER OCCIPITAL NERVE BLOCK    Standing Status:   Future    Expected Date:   06/05/2023    Expiration Date:   08/06/2023    Scheduling Instructions:     Procedure: Occipital nerve block     Laterality: Bilateral     Sedation: Patient's choice.     Timeframe: ASAA    Where will this procedure be performed?:   [facility]   Follow-up plan:   Return in about 4 weeks (around 06/06/2023) for B/L GONB.      Diagnostic cervical facet medial branch nerve blocks C3-C7 b/l 05/06/19- did not help Diagnostic thoracic facet medial branch nerve blocks T1-T2, consider bilateral occipital nerve block  Cervical/trapezius trigger point injections SPG block                     Recent Visits Date Type Provider Dept  02/13/23 Procedure visit Marcelino Nurse, MD [facility]  Showing recent visits within past 90 days and meeting all other requirements Today's Visits Date Type Provider Dept  05/09/23 Office Visit Marcelino Nurse, MD [facility]  Showing today's visits and meeting all other requirements Future Appointments Date Type Provider Dept  08/01/23 Appointment Marcelino Nurse, MD [facility]  Showing future appointments within next 90 days and meeting all other requirements  I discussed the assessment and treatment plan with the patient. The patient was provided an opportunity to ask questions and all were answered. The patient agreed with the plan and demonstrated an understanding of the instructions.  Patient advised to call back or seek an in-person evaluation if the symptoms or condition worsens.  Duration of encounter: .  Total time on encounter, as per AMA guidelines included both the face-to-face and non-face-to-face time personally spent by the physician and/or other qualified health care professional(s) on the day of the encounter (includes time in activities that require the physician or other qualified health care professional and does not include time in activities normally performed by clinical staff). Physician's time may include the following activities when performed: Preparing to see the patient (e.g., pre-charting review of records, searching for previously ordered imaging, lab work, and nerve conduction tests) Review of prior analgesic  pharmacotherapies. Reviewing PMP Interpreting ordered tests (e.g., lab work, imaging, nerve conduction tests) Performing post-procedure evaluations, including interpretation of diagnostic procedures Obtaining and/or reviewing separately obtained history Performing a medically appropriate examination and/or evaluation Counseling and educating the patient/family/caregiver Ordering medications, tests, or procedures Referring and communicating with other health care professionals (when not separately reported) Documenting clinical information in the electronic or other health record Independently interpreting results (not separately reported) and communicating results to the patient/ family/caregiver Care coordination (not separately reported)  Note by: Nurse Marcelino, MD Date: 05/09/2023; Time: 1:49 PM

## 2023-05-09 NOTE — Progress Notes (Signed)
 Nursing Pain Medication Assessment:  Safety precautions to be maintained throughout the outpatient stay will include: orient to surroundings, keep bed in low position, maintain call bell within reach at all times, provide assistance with transfer out of bed and ambulation.  Medication Inspection Compliance: Pill count conducted under aseptic conditions, in front of the patient. Neither the pills nor the bottle was removed from the patient's sight at any time. Once count was completed pills were immediately returned to the patient in their original bottle.  Medication: Hydrocodone /APAP Pill/Patch Count:  47 of 90 pills remain Pill/Patch Appearance: Markings consistent with prescribed medication Bottle Appearance: Standard pharmacy container. Clearly labeled. Filled Date: 01 / 20 / 2025 Last Medication intake:  Today

## 2023-05-09 NOTE — Patient Instructions (Signed)
 GENERAL RISKS AND COMPLICATIONS  What are the risk, side effects and possible complications? Generally speaking, most procedures are safe.  However, with any procedure there are risks, side effects, and the possibility of complications.  The risks and complications are dependent upon the sites that are lesioned, or the type of nerve block to be performed.  The closer the procedure is to the spine, the more serious the risks are.  Great care is taken when placing the radio frequency needles, block needles or lesioning probes, but sometimes complications can occur. Infection: Any time there is an injection through the skin, there is a risk of infection.  This is why sterile conditions are used for these blocks.  There are four possible types of infection. Localized skin infection. Central Nervous System Infection-This can be in the form of Meningitis, which can be deadly. Epidural Infections-This can be in the form of an epidural abscess, which can cause pressure inside of the spine, causing compression of the spinal cord with subsequent paralysis. This would require an emergency surgery to decompress, and there are no guarantees that the patient would recover from the paralysis. Discitis-This is an infection of the intervertebral discs.  It occurs in about 1% of discography procedures.  It is difficult to treat and it may lead to surgery.        2. Pain: the needles have to go through skin and soft tissues, will cause soreness.       3. Damage to internal structures:  The nerves to be lesioned may be near blood vessels or    other nerves which can be potentially damaged.       4. Bleeding: Bleeding is more common if the patient is taking blood thinners such as  aspirin, Coumadin, Ticiid, Plavix, etc., or if he/she have some genetic predisposition  such as hemophilia. Bleeding into the spinal canal can cause compression of the spinal  cord with subsequent paralysis.  This would require an emergency  surgery to  decompress and there are no guarantees that the patient would recover from the  paralysis.       5. Pneumothorax:  Puncturing of a lung is a possibility, every time a needle is introduced in  the area of the chest or upper back.  Pneumothorax refers to free air around the  collapsed lung(s), inside of the thoracic cavity (chest cavity).  Another two possible  complications related to a similar event would include: Hemothorax and Chylothorax.   These are variations of the Pneumothorax, where instead of air around the collapsed  lung(s), you may have blood or chyle, respectively.       6. Spinal headaches: They may occur with any procedures in the area of the spine.       7. Persistent CSF (Cerebro-Spinal Fluid) leakage: This is a rare problem, but may occur  with prolonged intrathecal or epidural catheters either due to the formation of a fistulous  track or a dural tear.       8. Nerve damage: By working so close to the spinal cord, there is always a possibility of  nerve damage, which could be as serious as a permanent spinal cord injury with  paralysis.       9. Death:  Although rare, severe deadly allergic reactions known as Anaphylactic  reaction can occur to any of the medications used.      10. Worsening of the symptoms:  We can always make thing worse.  What are the chances  of something like this happening? Chances of any of this occuring are extremely low.  By statistics, you have more of a chance of getting killed in a motor vehicle accident: while driving to the hospital than any of the above occurring .  Nevertheless, you should be aware that they are possibilities.  In general, it is similar to taking a shower.  Everybody knows that you can slip, hit your head and get killed.  Does that mean that you should not shower again?  Nevertheless always keep in mind that statistics do not mean anything if you happen to be on the wrong side of them.  Even if a procedure has a 1 (one) in a  1,000,000 (million) chance of going wrong, it you happen to be that one..Also, keep in mind that by statistics, you have more of a chance of having something go wrong when taking medications.  Who should not have this procedure? If you are on a blood thinning medication (e.g. Coumadin, Plavix, see list of Blood Thinners), or if you have an active infection going on, you should not have the procedure.  If you are taking any blood thinners, please inform your physician.  How should I prepare for this procedure? Do not eat or drink anything at least six hours prior to the procedure. Bring a driver with you .  It cannot be a taxi. Come accompanied by an adult that can drive you back, and that is strong enough to help you if your legs get weak or numb from the local anesthetic. Take all of your medicines the morning of the procedure with just enough water  to swallow them. If you have diabetes, make sure that you are scheduled to have your procedure done first thing in the morning, whenever possible. If you have diabetes, take only half of your insulin  dose and notify our nurse that you have done so as soon as you arrive at the clinic. If you are diabetic, but only take blood sugar pills (oral hypoglycemic), then do not take them on the morning of your procedure.  You may take them after you have had the procedure. Do not take aspirin or any aspirin-containing medications, at least eleven (11) days prior to the procedure.  They may prolong bleeding. Wear loose fitting clothing that may be easy to take off and that you would not mind if it got stained with Betadine or blood. Do not wear any jewelry or perfume Remove any nail coloring.  It will interfere with some of our monitoring equipment.  NOTE: Remember that this is not meant to be interpreted as a complete list of all possible complications.  Unforeseen problems may occur.  BLOOD THINNERS The following drugs contain aspirin or other products,  which can cause increased bleeding during surgery and should not be taken for 2 weeks prior to and 1 week after surgery.  If you should need take something for relief of minor pain, you may take acetaminophen  which is found in Tylenol ,m Datril, Anacin-3 and Panadol. It is not blood thinner. The products listed below are.  Do not take any of the products listed below in addition to any listed on your instruction sheet.  A.P.C or A.P.C with Codeine Codeine Phosphate Capsules #3 Ibuprofen  Ridaura  ABC compound Congesprin Imuran rimadil  Advil  Cope Indocin Robaxisal  Alka-Seltzer Effervescent Pain Reliever and Antacid Coricidin or Coricidin-D  Indomethacin Rufen   Alka-Seltzer plus Cold Medicine Cosprin Ketoprofen S-A-C Tablets  Anacin Analgesic Tablets or Capsules Coumadin  Korlgesic Salflex  Anacin Extra Strength Analgesic tablets or capsules CP-2 Tablets Lanoril Salicylate  Anaprox Cuprimine Capsules Levenox Salocol  Anexsia-D Dalteparin Magan Salsalate  Anodynos Darvon compound Magnesium  Salicylate Sine-off  Ansaid Dasin Capsules Magsal Sodium Salicylate  Anturane Depen Capsules Marnal Soma  APF Arthritis pain formula Dewitt's Pills Measurin Stanback  Argesic Dia-Gesic Meclofenamic Sulfinpyrazone  Arthritis Bayer Timed Release Aspirin Diclofenac Meclomen Sulindac  Arthritis pain formula Anacin Dicumarol Medipren  Supac  Analgesic (Safety coated) Arthralgen Diffunasal Mefanamic Suprofen  Arthritis Strength Bufferin Dihydrocodeine Mepro Compound Suprol  Arthropan liquid Dopirydamole Methcarbomol with Aspirin Synalgos  ASA tablets/Enseals Disalcid Micrainin Tagament  Ascriptin Doan's Midol  Talwin  Ascriptin A/D Dolene Mobidin Tanderil  Ascriptin Extra Strength Dolobid Moblgesic Ticlid  Ascriptin with Codeine Doloprin or Doloprin with Codeine Momentum Tolectin  Asperbuf Duoprin Mono-gesic Trendar  Aspergum Duradyne Motrin  or Motrin  IB Triminicin  Aspirin plain, buffered or enteric coated  Durasal Myochrisine Trigesic  Aspirin Suppositories Easprin Nalfon Trillsate  Aspirin with Codeine Ecotrin Regular or Extra Strength Naprosyn Uracel  Atromid-S Efficin Naproxen Ursinus  Auranofin Capsules Elmiron Neocylate Vanquish  Axotal Emagrin Norgesic Verin  Azathioprine Empirin or Empirin with Codeine Normiflo Vitamin E  Azolid Emprazil Nuprin  Voltaren  Bayer Aspirin plain, buffered or children's or timed BC Tablets or powders Encaprin Orgaran Warfarin Sodium  Buff-a-Comp Enoxaparin Orudis Zorpin  Buff-a-Comp with Codeine Equegesic Os-Cal-Gesic   Buffaprin Excedrin plain, buffered or Extra Strength Oxalid   Bufferin Arthritis Strength Feldene Oxphenbutazone   Bufferin plain or Extra Strength Feldene Capsules Oxycodone  with Aspirin   Bufferin with Codeine Fenoprofen Fenoprofen Pabalate or Pabalate-SF   Buffets II Flogesic Panagesic   Buffinol plain or Extra Strength Florinal or Florinal with Codeine Panwarfarin   Buf-Tabs Flurbiprofen Penicillamine   Butalbital Compound Four-way cold tablets Penicillin   Butazolidin Fragmin Pepto-Bismol   Carbenicillin Geminisyn Percodan   Carna Arthritis Reliever Geopen Persantine   Carprofen Gold's salt Persistin   Chloramphenicol Goody's Phenylbutazone   Chloromycetin Haltrain Piroxlcam   Clmetidine heparin  Plaquenil   Cllnoril Hyco-pap Ponstel   Clofibrate Hydroxy chloroquine Propoxyphen         Before stopping any of these medications, be sure to consult the physician who ordered them.  Some, such as Coumadin (Warfarin) are ordered to prevent or treat serious conditions such as deep thrombosis, pumonary embolisms, and other heart problems.  The amount of time that you may need off of the medication may also vary with the medication and the reason for which you were taking it.  If you are taking any of these medications, please make sure you notify your pain physician before you undergo any procedures.         Moderate Conscious  Sedation, Adult Sedation is the use of medicines to help you relax and not feel pain. Moderate conscious sedation is a type of sedation that makes you less alert than normal. You are still able to respond to instructions, touch, or both. This type of sedation is used during short medical and dental procedures. It is milder than deep sedation, which is a type of sedation you cannot be easily woken up from. It is also milder than general anesthesia, which is the use of medicines to make you fall asleep. Moderate conscious sedation lets you return to your normal activities sooner. Tell a health care provider about: Any allergies you have. All medicines you are taking, including vitamins, herbs, steroids, eye drops, creams, and over-the-counter medicines. Any problems you or family members have had with anesthesia.  Any bleeding problems you have. Any surgeries you have had. Any medical conditions you have. Whether you are pregnant or may be pregnant. Any recent alcohol, tobacco, or drug use. What are the risks? Your health care provider will talk with you about risks. These may include: Oversedation. This is when you get too much medicine. Nausea or vomiting. Allergic reaction to medicines. Trouble breathing. If this happens, a breathing tube may be used. It will be removed when you can breathe better on your own. Heart trouble. Lung trouble. Emergence delirium. This is when you feel confused while the sedation wears off. This gets better with time. What happens before the procedure? When to stop eating and drinking Follow instructions from your health care provider about what you may eat and drink. These may include: 8 hours before your procedure Stop eating most foods. Do not eat meat, fried foods, or fatty foods. Eat only light foods, such as toast or crackers. All liquids are okay except energy drinks and alcohol. 6 hours before your procedure Stop eating. Drink only clear liquids, such  as water , clear fruit juice, black coffee, plain tea, and sports drinks. Do not drink energy drinks or alcohol. 2 hours before your procedure Stop drinking all liquids. You may be allowed to take medicines with small sips of water . If you do not follow your health care provider's instructions, your procedure may be delayed or canceled. Medicines Ask your health care provider about: Changing or stopping your regular medicines. These include any diabetes medicines or blood thinners you take. Taking medicines such as aspirin and ibuprofen . These medicines can thin your blood. Do not take them unless your health care provider tells you to. Taking over-the-counter medicines, vitamins, herbs, and supplements. Tests and exams You may have an exam or testing. You may have a blood or urine sample taken. General instructions Do not use any products that contain nicotine or tobacco for at least 4 weeks before the procedure. These products include cigarettes, chewing tobacco, and vaping devices, such as e-cigarettes. If you need help quitting, ask your health care provider. If you will be going home right after the procedure, plan to have a responsible adult: Take you home from the hospital or clinic. You will not be allowed to drive. Care for you for the time you are told. What happens during the procedure?  You will be given the sedative. It may be given: As a pill you can take by mouth. It can also be put into the rectum. As a spray through the nose. As an injection into muscle. As an injection into a vein through an IV. You may be given oxygen as needed. Your blood pressure, heart rate, breathing rate, and blood oxygen level will be monitored during the procedure. The medical or dental procedure will be done. The procedure may vary among health care providers and hospitals. What happens after the procedure? Your blood pressure, heart rate, breathing rate, and blood oxygen level will be  monitored until you leave the hospital or clinic. You will get fluids through an IV as needed. Do not drive or operate machinery until your health care provider says that it is safe. This information is not intended to replace advice given to you by your health care provider. Make sure you discuss any questions you have with your health care provider. Document Revised: 10/02/2021 Document Reviewed: 10/02/2021 Elsevier Patient Education  2024 [employer]Occipital Nerve Block Patient Information  Description: The occipital nerves originate in the cervical (neck)  spinal cord and travel upward through muscle and tissue to supply sensation to the back of the head and top of the scalp.  In addition, the nerves control some of the muscles of the scalp.  Occipital neuralgia is an irritation of these nerves which can cause headaches, numbness of the scalp, and neck discomfort.     The occipital nerve block will interrupt nerve transmission through these nerves and can relieve pain and spasm.  The block consists of insertion of a small needle under the skin in the back of the head to deposit local anesthetic (numbing medicine) and/or steroids around the nerve.  The entire block usually lasts less than 5 minutes.  Conditions which may be treated by occipital blocks:  Muscular pain and spasm of the scalp Nerve irritation, back of the head Headaches Upper neck pain  Preparation for the injection:  Do not eat any solid food or dairy products within 8 hours of your appointment. You may drink clear liquids up to 3 hours before appointment.  Clear liquids include water , black coffee, juice or soda.  No milk or cream please. You may take your regular medication, including pain medications, with a sip of water  before you appointment.  Diabetics should hold regular insulin  (if taken separately) and take 1/2 normal NPH dose the morning of the procedure.  Carry some sugar containing items with you to your  appointment. A driver must accompany you and be prepared to drive you home after your procedure. Bring all your current medications with you. An IV may be inserted and sedation may be given at the discretion of the physician. A blood pressure cuff, EKG, and other monitors will often be applied during the procedure.  Some patients may need to have extra oxygen administered for a short period. You will be asked to provide medical information, including your allergies and medications, prior to the procedure.  We must know immediately if you are taking blood thinners (like Coumadin/Warfarin) or if you are allergic to IV iodine contrast (dye).  We must know if you could possible be pregnant.  Do not wear a high collared shirt or turtleneck.  Tie long hair up in the back if possible.  Possible side-effects:  Bleeding from needle site Infection (rare, may require surgery) Nerve injury (rare) Hair on back of neck can be tinged with iodine scrub (this will wash out) Light-headedness (temporary) Pain at injection site (several days) Decreased blood pressure (rare, temporary) Seizure (very rare)  Call if you experience:  Hives or difficulty breathing ( go to the emergency room) Inflammation or drainage at the injection site(s)  Please note:  Although the local anesthetic injected can often make your painful muscles or headache feel good for several hours after the injection, the pain may return.  It takes 3-7 days for steroids to work.  You may not notice any pain relief for at least one week.  If effective, we will often do a series of injections spaced 3-6 weeks apart to maximally decrease your pain.  If you have any questions, please call [phone] [facility]

## 2023-05-16 ENCOUNTER — Other Ambulatory Visit: Payer: Self-pay | Admitting: Student in an Organized Health Care Education/Training Program

## 2023-05-17 ENCOUNTER — Other Ambulatory Visit: Payer: Self-pay | Admitting: *Deleted

## 2023-05-17 ENCOUNTER — Telehealth: Payer: Self-pay | Admitting: Student in an Organized Health Care Education/Training Program

## 2023-05-17 NOTE — Telephone Encounter (Signed)
Pharmacy tech Morrie Sheldon called stated that they have send over two request for patient medications refill. Please give pharmacy a call at [phone]. TY

## 2023-05-20 MED ORDER — TIZANIDINE HCL 4 MG PO TABS: 4.0000 mg | ORAL_TABLET | Freq: Three times a day (TID) | ORAL | 0 refills | Status: DC

## 2023-05-21 DIAGNOSIS — M5416 Radiculopathy, lumbar region: Secondary | ICD-10-CM

## 2023-05-22 ENCOUNTER — Telehealth: Payer: Self-pay

## 2023-05-22 DIAGNOSIS — M5416 Radiculopathy, lumbar region: Secondary | ICD-10-CM

## 2023-05-22 NOTE — Telephone Encounter (Signed)
Dr Myer Haff notified Dr Katrinka Blazing and I that he spoke with the patient and advised to go to ED or come to clinic tomorrow. Dr Katrinka Blazing stated he will call the patient this evening and will add him to clinic tomorrow afternoon.

## 2023-05-22 NOTE — Telephone Encounter (Signed)
Media Information

## 2023-05-23 ENCOUNTER — Ambulatory Visit (INDEPENDENT_AMBULATORY_CARE_PROVIDER_SITE_OTHER): Payer: Medicare Other | Admitting: Neurosurgery

## 2023-05-23 ENCOUNTER — Ambulatory Visit
Admission: RE | Admit: 2023-05-23 | Discharge: 2023-05-23 | Disposition: A | Discharge status: Home / Self Care | Payer: Medicare Other | Source: Ambulatory Visit | Attending: Neurosurgery | Admitting: Neurosurgery

## 2023-05-23 ENCOUNTER — Ambulatory Visit
Admission: RE | Admit: 2023-05-23 | Discharge: 2023-05-23 | Disposition: A | Discharge status: Home / Self Care | Payer: Medicare Other | Attending: Neurosurgery | Admitting: Neurosurgery

## 2023-05-23 ENCOUNTER — Encounter: Payer: Self-pay | Admitting: Neurosurgery

## 2023-05-23 VITALS — BP 106/78 | Ht 75.0 in | Wt 300.0 lb

## 2023-05-23 DIAGNOSIS — M48061 Spinal stenosis, lumbar region without neurogenic claudication: Secondary | ICD-10-CM

## 2023-05-23 DIAGNOSIS — Z09 Encounter for follow-up examination after completed treatment for conditions other than malignant neoplasm: Secondary | ICD-10-CM

## 2023-05-23 DIAGNOSIS — M5416 Radiculopathy, lumbar region: Secondary | ICD-10-CM | POA: Insufficient documentation
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
## 2023-05-25 ENCOUNTER — Emergency Department: Payer: Medicare Other

## 2023-05-25 ENCOUNTER — Inpatient Hospital Stay
Admission: EM | Admit: 2023-05-25 | Discharge: 2023-05-31 | DRG: 519 | Disposition: A | Discharge status: Home / Self Care | Payer: Medicare Other | Attending: Internal Medicine | Admitting: Internal Medicine

## 2023-05-25 ENCOUNTER — Other Ambulatory Visit: Payer: Self-pay

## 2023-05-25 ENCOUNTER — Encounter: Payer: Self-pay | Admitting: Neurosurgery

## 2023-05-25 DIAGNOSIS — Z833 Family history of diabetes mellitus: Secondary | ICD-10-CM

## 2023-05-25 DIAGNOSIS — M5459 Other low back pain: Secondary | ICD-10-CM | POA: Diagnosis present

## 2023-05-25 DIAGNOSIS — M549 Dorsalgia, unspecified: Secondary | ICD-10-CM | POA: Diagnosis not present

## 2023-05-25 DIAGNOSIS — Z888 Allergy status to other drugs, medicaments and biological substances status: Secondary | ICD-10-CM

## 2023-05-25 DIAGNOSIS — Z882 Allergy status to sulfonamides status: Secondary | ICD-10-CM

## 2023-05-25 DIAGNOSIS — I10 Essential (primary) hypertension: Secondary | ICD-10-CM | POA: Diagnosis present

## 2023-05-25 DIAGNOSIS — R000 Tachycardia, unspecified: Secondary | ICD-10-CM | POA: Diagnosis not present

## 2023-05-25 DIAGNOSIS — Z8261 Family history of arthritis: Secondary | ICD-10-CM

## 2023-05-25 DIAGNOSIS — G8929 Other chronic pain: Secondary | ICD-10-CM | POA: Diagnosis present

## 2023-05-25 DIAGNOSIS — M5106 Intervertebral disc disorders with myelopathy, lumbar region: Secondary | ICD-10-CM | POA: Diagnosis present

## 2023-05-25 DIAGNOSIS — Z981 Arthrodesis status: Secondary | ICD-10-CM

## 2023-05-25 DIAGNOSIS — R262 Difficulty in walking, not elsewhere classified: Secondary | ICD-10-CM | POA: Insufficient documentation

## 2023-05-25 DIAGNOSIS — D72829 Elevated white blood cell count, unspecified: Secondary | ICD-10-CM | POA: Diagnosis not present

## 2023-05-25 DIAGNOSIS — Z825 Family history of asthma and other chronic lower respiratory diseases: Secondary | ICD-10-CM

## 2023-05-25 DIAGNOSIS — Z9181 History of falling: Secondary | ICD-10-CM

## 2023-05-25 DIAGNOSIS — Z7984 Long term (current) use of oral hypoglycemic drugs: Secondary | ICD-10-CM

## 2023-05-25 DIAGNOSIS — M48062 Spinal stenosis, lumbar region with neurogenic claudication: Secondary | ICD-10-CM | POA: Diagnosis not present

## 2023-05-25 DIAGNOSIS — M25551 Pain in right hip: Secondary | ICD-10-CM | POA: Diagnosis present

## 2023-05-25 DIAGNOSIS — Z884 Allergy status to anesthetic agent status: Secondary | ICD-10-CM

## 2023-05-25 DIAGNOSIS — Z8249 Family history of ischemic heart disease and other diseases of the circulatory system: Secondary | ICD-10-CM

## 2023-05-25 DIAGNOSIS — Z883 Allergy status to other anti-infective agents status: Secondary | ICD-10-CM

## 2023-05-25 DIAGNOSIS — Z79899 Other long term (current) drug therapy: Secondary | ICD-10-CM

## 2023-05-25 DIAGNOSIS — F419 Anxiety disorder, unspecified: Secondary | ICD-10-CM | POA: Diagnosis present

## 2023-05-25 DIAGNOSIS — M48061 Spinal stenosis, lumbar region without neurogenic claudication: Principal | ICD-10-CM | POA: Diagnosis present

## 2023-05-25 DIAGNOSIS — Z91030 Bee allergy status: Secondary | ICD-10-CM

## 2023-05-25 DIAGNOSIS — G20C Parkinsonism, unspecified: Secondary | ICD-10-CM | POA: Insufficient documentation

## 2023-05-25 DIAGNOSIS — M5116 Intervertebral disc disorders with radiculopathy, lumbar region: Secondary | ICD-10-CM | POA: Diagnosis present

## 2023-05-25 DIAGNOSIS — G894 Chronic pain syndrome: Secondary | ICD-10-CM | POA: Diagnosis present

## 2023-05-25 DIAGNOSIS — E1142 Type 2 diabetes mellitus with diabetic polyneuropathy: Secondary | ICD-10-CM

## 2023-05-25 DIAGNOSIS — F32A Depression, unspecified: Secondary | ICD-10-CM | POA: Diagnosis present

## 2023-05-25 DIAGNOSIS — E669 Obesity, unspecified: Secondary | ICD-10-CM | POA: Diagnosis present

## 2023-05-25 DIAGNOSIS — M5416 Radiculopathy, lumbar region: Secondary | ICD-10-CM | POA: Diagnosis present

## 2023-05-25 DIAGNOSIS — G40909 Epilepsy, unspecified, not intractable, without status epilepticus: Secondary | ICD-10-CM | POA: Diagnosis present

## 2023-05-25 DIAGNOSIS — K219 Gastro-esophageal reflux disease without esophagitis: Secondary | ICD-10-CM | POA: Diagnosis present

## 2023-05-25 NOTE — ED Triage Notes (Signed)
 Pt to ed from home via [facility] for back pain. Pt just recently laminectomy here at our facility. Pt also had an XRAY this week and was no acute changes. He called his neurosurgeon and they advised to come here for MRI. Pt is caox4, in no acute distress in triage.

## 2023-05-25 NOTE — ED Notes (Signed)
 Pt currently in MRI, MRI notified to bring patient to [facility] upon completion of his MRI.

## 2023-05-25 NOTE — Progress Notes (Signed)
   DOS: 04/14/23   HISTORY OF PRESENT ILLNESS: 05/23/2023 [name] is status post lumbar discectomy.  He was originally doing well, however for approximately 2 to 3 days prior to this add-on visit he had worsening pain and cramping.  Noticed this mostly in his back but down into his right lower extremity to the outside of his hip.  He notices severe tenderness at this location.  Not having any new bowel or bladder issues.  No new numbness or tingling.  He does feel a giveway sense when he is in certain positions..   PHYSICAL EXAMINATION:   Vitals:   05/23/23 1602  BP: 106/78   General: Patient is well developed, well nourished, calm, collected, and in no apparent distress.  NEUROLOGICAL:  General: In no acute distress.  Awake, alert, oriented to person, place, and time. Pupils equal round and reactive to light.   Strength: Some pain limitation noted, however strong activation in all his lower extremity muscle groups  Incision c/d/i   Does have some pain in his right lateral hip with passive range of motion.  He has a severe shooting pain with palpation over his trochanteric bursa.  He states that this pain is very  ROS (Neurologic): Negative except as noted above  IMAGING: X-rays reviewed but still pending final read.  Potentially a superior endplate fracture but will continue to follow-up, no evidence of dynamic listhesis  ASSESSMENT/PLAN:  Thomas Cole is here today for an add-on in clinic.  He had some postoperative concerns as he was originally doing well but then had an exacerbation of pain in his buttocks and significantly in his right hip.  Thankfully he is having no red flag signs or symptoms.  He has some pain to passive range of motion in his right hip and a severe pain to palpation over his trochanteric bursa.  His gait has changed somewhat since his disc herniation, this could put him at risk for some bursitis, we will plan to reach out to Dr. Cherylann Ratel to schedule  evaluation for possible injections.  His x-rays show no evidence of dynamic listhesis or iatrogenic pars issues, at this point we will continue to watch him with conservative care and injection management.  Should he not have any improvement after his injections or continue to have issues we will consider cross-sectional imaging.   Lovenia Kim, MD/MS Department of Neurosurgery

## 2023-05-25 NOTE — ED Provider Notes (Signed)
 [facility] Provider Note    Event Date/Time   First MD Initiated Contact with Patient 05/25/23 2346     (approximate)   History   Back Pain   HPI  Thomas Cole is a 53 y.o. male with history of hypertension, diabetes, chronic back pain on hydrocodone, tizanidine, pregabalin who presents to the emergency department with increasing lower back pain that radiates into the buttocks.  No numbness, tingling, focal weakness, bowel or bladder incontinence, fever.  Patient underwent L4-L5 microdiscectomy with Dr. Ernestine Mcmurray on 04/14/2023.  Reports his home medications are not controlling his pain.  Was told to come to the ED by his neurosurgeon for MRI.   History provided by patient.    Past Medical History:  Diagnosis Date   Anxiety    Asthma    Back problem    Benign essential hypertension 11/30/2016   Cervical spondylosis without myelopathy 09/17/2012   Complication of anesthesia    occ takes longer to wake   Depression    Diabetes mellitus without complication (HCC) 02/2020   Type 2   GERD (gastroesophageal reflux disease)    Hypertension    Labile hypertension 03/19/2013   No current neuro changes, headache much improved. No clear sign of intracranial bleed. NO indication for head CT>  BP much better now... Fluctuates a lot.  Will eval for pheochromocytoma. As recommended at last cardiology OV.. Will prescribe hydralazine to use as needed for BP fluctuations.  Close follow up with PCP.    Major depressive disorder, recurrent episode, severe (HCC) 06/23/2015   Migraine headache    Migraines 11/11/2013   S/P appendectomy 04/09/2011   Seizure (HCC) 11/30/2016   Seizures (HCC)    last 87   Shortness of breath    SOB (shortness of breath) 08/06/2014   - [facility] eval with cpst 02/20/2008 exercise testing demonstrateed normal functional capabilities and a normal cardiopulmonary response to exercise. - 09/16/2014  Walked RA x 3 laps @ 185 ft each stopped due to end  of study/ nl pace/ no desat  - spirometry 09/16/2014 > no obst/ min restriction       Past Surgical History:  Procedure Laterality Date   ANTERIOR CERVICAL CORPECTOMY N/A 09/15/2012   Procedure: Cervical Three-Four,Cervical Six-Seven Anterior cervical decompression/diskectomy fusion with Cervical Four Corpectomy;  Surgeon: Barnett Abu, MD;  Location: [facility];  Service: Neurosurgery;  Laterality: N/A;  Cervical Three-Four,Cervical Six-Seven  Anterior cervical decompression/diskectomy fusion with Cervical four Corpectomy   APPENDECTOMY  12   CERVICAL DISC SURGERY     CHOLECYSTECTOMY N/A 03/29/2017   Procedure: LAPAROSCOPIC CHOLECYSTECTOMY;  Surgeon: Lattie Haw, MD;  Location: [facility] ORS;  Service: General;  Laterality: N/A;   COLONOSCOPY WITH PROPOFOL N/A 10/23/2019   Procedure: COLONOSCOPY WITH PROPOFOL;  Surgeon: Earline Mayotte, MD;  Location: [facility];  Service: Endoscopy;  Laterality: N/A;   ESOPHAGOGASTRODUODENOSCOPY (EGD) WITH PROPOFOL N/A 10/23/2019   Procedure: ESOPHAGOGASTRODUODENOSCOPY (EGD) WITH PROPOFOL;  Surgeon: Earline Mayotte, MD;  Location: [facility] ENDOSCOPY;  Service: Endoscopy;  Laterality: N/A;   KNEE SURGERY     bilateral   LUMBAR LAMINECTOMY/DECOMPRESSION MICRODISCECTOMY Right 04/14/2023   Procedure: L4-5 Microdiscectomy;  Surgeon: Lovenia Kim, MD;  Location: [facility] ORS;  Service: Neurosurgery;  Laterality: Right;   POSTERIOR FUSION CERVICAL SPINE      MEDICATIONS:  Prior to Admission medications   Medication Sig Start Date End Date Taking? Authorizing Provider  carbidopa-levodopa (SINEMET CR) 50-200 MG tablet Take 1  tablet by mouth at bedtime.    [provider]  carbidopa-levodopa (SINEMET IR) 25-100 MG tablet TAKE 2 TABLETS BY MOUTH 3 (THREE) TIMES DAILY 06/25/18   [provider]  Cholecalciferol (VITAMIN D3) 25 MCG (1000 UT) CAPS Take 1 capsule by mouth daily.    [provider]  clonazePAM (KLONOPIN) 1 MG tablet Take 1  mg by mouth at bedtime as needed. 04/10/19   [provider]  DULoxetine (CYMBALTA) 60 MG capsule Take 1 capsule (60 mg total) by mouth daily after breakfast. 02/05/23 08/04/23  Edward Jolly, MD  furosemide (LASIX) 20 MG tablet Take 20 mg by mouth daily.    [provider]  hydrochlorothiazide (HYDRODIURIL) 25 MG tablet Take 25 mg by mouth daily. 07/03/18   [provider]  HYDROcodone-acetaminophen (NORCO) 10-325 MG tablet Take 1 tablet by mouth every 8 (eight) hours as needed. For chronic pain syndrome. Each Rx to last 30 days. 05/15/23 06/14/23  Edward Jolly, MD  HYDROcodone-acetaminophen (NORCO) 10-325 MG tablet Take 1 tablet by mouth every 8 (eight) hours as needed. For chronic pain syndrome. Each Rx to last 30 days. 06/14/23 07/14/23  Edward Jolly, MD  HYDROcodone-acetaminophen (NORCO) 10-325 MG tablet Take 1 tablet by mouth every 8 (eight) hours as needed. For chronic pain syndrome. Each Rx to last 30 days. 07/14/23 08/13/23  Edward Jolly, MD  lamoTRIgine (LAMICTAL) 25 MG tablet Take 25 mg by mouth at bedtime. Titration schedule 01/24/23 04/13/23  [provider]  losartan (COZAAR) 100 MG tablet Take 100 mg by mouth daily. 09/23/19   [provider]  metFORMIN (GLUCOPHAGE) 500 MG tablet Take 500 mg by mouth daily after lunch.    [provider]  metoprolol succinate (TOPROL-XL) 25 MG 24 hr tablet Take 12.5 mg by mouth daily. 08/10/21 04/13/23  [provider]  nortriptyline (PAMELOR) 25 MG capsule Take 2 capsules (50 mg total) by mouth at bedtime. 02/05/23   Edward Jolly, MD  omeprazole (PRILOSEC) 40 MG capsule Take 1 capsule by mouth 2 (two) times daily. 06/16/20   [provider]  pregabalin (LYRICA) 100 MG capsule Take 1 capsule (100 mg total) by mouth 3 (three) times daily. 05/09/23 11/05/23  Edward Jolly, MD  sildenafil (VIAGRA) 100 MG tablet 1/2 to 1 tab daily as needed prior to sex 09/19/21   [provider]  tiZANidine  (ZANAFLEX) 4 MG tablet Take 1 tablet (4 mg total) by mouth 3 (three) times daily. 05/20/23   Edward Jolly, MD  vitamin B-12 (CYANOCOBALAMIN) 1000 MCG tablet Take 1,000 mcg by mouth daily.    [provider]    Physical Exam   Triage Vital Signs: ED Triage Vitals  Encounter Vitals Group     BP 05/25/23 2150 (!) 139/92     Systolic BP Percentile --      Diastolic BP Percentile --      Pulse Rate 05/25/23 2150 100     Resp 05/25/23 2150 18     Temp 05/25/23 2150 98 F (36.7 C)     Temp Source 05/25/23 2150 Oral     SpO2 05/25/23 2150 100 %     Weight 05/25/23 2151 299 lb 13.2 oz (136 kg)     Height 05/25/23 2151 6\' 3"  (1.905 m)     Head Circumference --      Peak Flow --      Pain Score 05/25/23 2151 10     Pain Loc --      Pain Education --  Exclude from Growth Chart --     Most recent vital signs: Vitals:   05/26/23 0127 05/26/23 0345  BP: 114/65   Pulse: 94   Resp: 20   Temp:  98 F (36.7 C)  SpO2: 94%     CONSTITUTIONAL: Alert, responds appropriately to questions. Well-appearing; well-nourished HEAD: Normocephalic, atraumatic EYES: Conjunctivae clear, pupils appear equal, sclera nonicteric ENT: normal nose; moist mucous membranes NECK: Supple, normal ROM CARD: RRR; S1 and S2 appreciated RESP: Normal chest excursion without splinting or tachypnea; breath sounds clear and equal bilaterally; no wheezes, no rhonchi, no rales, no hypoxia or respiratory distress, speaking full sentences ABD/GI: Non-distended; soft, non-tender, no rebound, no guarding, no peritoneal signs BACK: The back appears normal, tender over the lumbar spine, incision is completely healed without surrounding redness, warmth, drainage, dehiscence, induration, fluctuance EXT: Normal ROM in all joints; no deformity noted, no edema SKIN: Normal color for age and race; warm; no rash on exposed skin NEURO: Moves all extremities equally, normal speech, diminished reflexes diffusely, no clonus,  normal sensation, no saddle anesthesia PSYCH: The patient's mood and manner are appropriate.   ED Results / Procedures / Treatments   LABS: (all labs ordered are listed, but only abnormal results are displayed) Labs Reviewed  BASIC METABOLIC PANEL - Abnormal; Notable for the following components:      Result Value   Glucose, Bld 104 (*)    BUN 21 (*)    All other components within normal limits  CBC WITH DIFFERENTIAL/PLATELET  BASIC METABOLIC PANEL  CBC     EKG:  EKG Interpretation Date/Time:    Ventricular Rate:    PR Interval:    QRS Duration:    QT Interval:    QTC Calculation:   R Axis:      Text Interpretation:           RADIOLOGY: My personal review and interpretation of imaging: MRI shows no cauda equina.  Patient has spinal canal and neuroforaminal stenosis.  I have personally reviewed all radiology reports.   MR LUMBAR SPINE WO CONTRAST Result Date: 05/26/2023 CLINICAL DATA:  Low back pain with recent laminectomy EXAM: MRI LUMBAR SPINE WITHOUT CONTRAST TECHNIQUE: Multiplanar, multisequence MR imaging of the lumbar spine was performed. No intravenous contrast was administered. COMPARISON:  04/12/2023 and 05/23/2023 FINDINGS: Segmentation:  Standard. Alignment:  Physiologic. Vertebrae: Status post right L4-5 laminectomy. No acute fracture or discitis-osteomyelitis. Conus medullaris and cauda equina: Conus extends to the L1 level. Conus and cauda equina appear normal. Paraspinal and other soft tissues: Negative. Disc levels: L1-L2: Normal disc space and facet joints. No spinal canal stenosis. No neural foraminal stenosis. L2-L3: Intermediate sized disc bulge. Unchanged severe spinal canal stenosis. No neural foraminal stenosis. L3-L4: Intermediate sized disc bulge. Unchanged severe spinal canal stenosis. No neural foraminal stenosis. L4-L5: Status post right laminectomy. Intermediate sized disc bulge. Severe spinal canal stenosis. Severe bilateral neural foraminal  stenosis. L5-S1: Small disc bulge with superimposed central protrusion and mild facet hypertrophy. Left lateral recess narrowing without central spinal canal stenosis. Mild right and moderate left neural foraminal stenosis. Visualized sacrum: Normal. IMPRESSION: 1. Status post right L4-5 laminectomy with unchanged severe spinal canal and bilateral neural foraminal stenosis. 2. Unchanged severe spinal canal stenosis at L2-L3 and L3-L4. 3. Unchanged moderate left L5-S1 neural foraminal stenosis. Electronically Signed   By: Deatra Robinson M.D.   On: 05/26/2023 00:15     PROCEDURES:  Critical Care performed: No      Procedures  IMPRESSION / MDM / ASSESSMENT AND PLAN / ED COURSE  I reviewed the triage vital signs and the nursing notes.    Patient here with increasing lower back pain without focal neurodeficits.    DIFFERENTIAL DIAGNOSIS (includes but not limited to):   Lumbar radiculopathy, spinal stenosis, less likely cauda equina, doubt epidural abscess or hematoma, discitis or osteomyelitis, transverse myelitis   Patient's presentation is most consistent with acute complicated illness / injury requiring diagnostic workup.   PLAN: Will obtain basic labs, MRI of the lumbar spine.  Will give IV pain medication.   MEDICATIONS GIVEN IN ED: Medications  HYDROcodone-acetaminophen (NORCO) 10-325 MG per tablet 1 tablet (has no administration in time range)  furosemide (LASIX) tablet 20 mg (has no administration in time range)  hydrochlorothiazide (HYDRODIURIL) tablet 25 mg (has no administration in time range)  losartan (COZAAR) tablet 100 mg (has no administration in time range)  metoprolol succinate (TOPROL-XL) 24 hr tablet 12.5 mg (has no administration in time range)  DULoxetine (CYMBALTA) DR capsule 60 mg (has no administration in time range)  cyanocobalamin (VITAMIN B12) tablet 1,000 mcg (has no administration in time range)  carbidopa-levodopa (SINEMET CR) 50-200 MG per tablet  controlled release 1 tablet (has no administration in time range)  carbidopa-levodopa (SINEMET IR) 25-100 MG per tablet immediate release 2 tablet (has no administration in time range)  clonazePAM (KLONOPIN) tablet 1 mg (has no administration in time range)  lamoTRIgine (LAMICTAL) tablet 25 mg (has no administration in time range)  pregabalin (LYRICA) capsule 100 mg (has no administration in time range)  tiZANidine (ZANAFLEX) tablet 4 mg (has no administration in time range)  cholecalciferol (VITAMIN D3) tablet 1,000 Units (has no administration in time range)  enoxaparin (LOVENOX) injection 70 mg (has no administration in time range)  0.9 %  sodium chloride infusion ( Intravenous New Bag/Given 05/26/23 0346)  acetaminophen (TYLENOL) tablet 650 mg (has no administration in time range)    Or  acetaminophen (TYLENOL) suppository 650 mg (has no administration in time range)  traZODone (DESYREL) tablet 25 mg (has no administration in time range)  magnesium hydroxide (MILK OF MAGNESIA) suspension 30 mL (has no administration in time range)  ondansetron (ZOFRAN) tablet 4 mg (has no administration in time range)    Or  ondansetron (ZOFRAN) injection 4 mg (has no administration in time range)  HYDROmorphone (DILAUDID) injection 1 mg (1 mg Intravenous Given 05/26/23 0028)  ondansetron (ZOFRAN) injection 4 mg (4 mg Intravenous Given 05/26/23 0028)  HYDROmorphone (DILAUDID) injection 1 mg (1 mg Intravenous Given 05/26/23 0147)     ED COURSE: Labs show normal hemoglobin, no leukocytosis, normal electrolytes.  MRI reviewed and interpreted by myself and the radiologist and shows no acute change compared to prior but he does have spinal stenosis and neural foraminal stenosis which could cause this pain.  Discussed with Dr. Katrinka Blazing on-call for neurosurgery.  He thinks that patient may have underlying hip pathology causing some of his symptoms and patient may benefit from MRI of the hip.  Patient reports pain is  improved here after 2 rounds of Dilaudid but he is still very uncomfortable and states he is having a hard time walking.  Will admit to the hospitalist for pain control, physical therapy.  Neurosurgery will see patient in the morning in consultation.   CONSULTS:  Consulted and discussed patient's case with hospitalist, Dr. Arville Care.  I have recommended admission and consulting physician agrees and will place admission orders.  Patient (and family if present)  agree with this plan.   I reviewed all nursing notes, vitals, pertinent previous records.  All labs, EKGs, imaging ordered have been independently reviewed and interpreted by myself.    OUTSIDE RECORDS REVIEWED: Reviewed recent neurosurgery notes.       FINAL CLINICAL IMPRESSION(S) / ED DIAGNOSES   Final diagnoses:  Intractable back pain     Rx / DC Orders   ED Discharge Orders     None        Note:  This document was prepared using Dragon voice recognition software and may include unintentional dictation errors.   Joelee Snoke, Layla Maw, DO 05/26/23 0400

## 2023-05-26 ENCOUNTER — Observation Stay: Payer: Medicare Other

## 2023-05-26 DIAGNOSIS — G8929 Other chronic pain: Secondary | ICD-10-CM | POA: Diagnosis present

## 2023-05-26 DIAGNOSIS — M48062 Spinal stenosis, lumbar region with neurogenic claudication: Secondary | ICD-10-CM

## 2023-05-26 DIAGNOSIS — M5459 Other low back pain: Secondary | ICD-10-CM | POA: Diagnosis not present

## 2023-05-26 DIAGNOSIS — E1142 Type 2 diabetes mellitus with diabetic polyneuropathy: Secondary | ICD-10-CM | POA: Diagnosis not present

## 2023-05-26 DIAGNOSIS — M549 Dorsalgia, unspecified: Principal | ICD-10-CM

## 2023-05-26 DIAGNOSIS — R262 Difficulty in walking, not elsewhere classified: Secondary | ICD-10-CM | POA: Diagnosis not present

## 2023-05-26 DIAGNOSIS — M5416 Radiculopathy, lumbar region: Secondary | ICD-10-CM

## 2023-05-26 DIAGNOSIS — F419 Anxiety disorder, unspecified: Secondary | ICD-10-CM

## 2023-05-26 DIAGNOSIS — F32A Depression, unspecified: Secondary | ICD-10-CM

## 2023-05-26 DIAGNOSIS — G20C Parkinsonism, unspecified: Secondary | ICD-10-CM | POA: Insufficient documentation

## 2023-05-26 DIAGNOSIS — I10 Essential (primary) hypertension: Secondary | ICD-10-CM

## 2023-05-26 LAB — CBC
HCT: 39.7 % (ref 39.0–52.0)
Hemoglobin: 14 g/dL (ref 13.0–17.0)
MCH: 31 pg (ref 26.0–34.0)
MCHC: 35.3 g/dL (ref 30.0–36.0)
MCV: 88 fL (ref 80.0–100.0)
Platelets: 174 10*3/uL (ref 150–400)
RBC: 4.51 MIL/uL (ref 4.22–5.81)
RDW: 13.3 % (ref 11.5–15.5)
WBC: 8.4 10*3/uL (ref 4.0–10.5)
nRBC: 0 % (ref 0.0–0.2)

## 2023-05-26 LAB — BASIC METABOLIC PANEL
Anion gap: 10 (ref 5–15)
Anion gap: 11 (ref 5–15)
BUN: 21 mg/dL — ABNORMAL HIGH (ref 6–20)
BUN: 26 mg/dL — ABNORMAL HIGH (ref 6–20)
CO2: 25 mmol/L (ref 22–32)
CO2: 25 mmol/L (ref 22–32)
Calcium: 9.4 mg/dL (ref 8.9–10.3)
Calcium: 9.6 mg/dL (ref 8.9–10.3)
Chloride: 100 mmol/L (ref 98–111)
Chloride: 101 mmol/L (ref 98–111)
Creatinine, Ser: 0.86 mg/dL (ref 0.61–1.24)
Creatinine, Ser: 0.9 mg/dL (ref 0.61–1.24)
GFR, Estimated: >60 mL/min (ref >60)
GFR, Estimated: >60 mL/min (ref >60)
Glucose, Bld: 104 mg/dL — ABNORMAL HIGH (ref 70–99)
Glucose, Bld: 92 mg/dL (ref 70–99)
Potassium: 3.8 mmol/L (ref 3.5–5.1)
Potassium: 3.9 mmol/L (ref 3.5–5.1)
Sodium: 136 mmol/L (ref 135–145)
Sodium: 136 mmol/L (ref 135–145)

## 2023-05-26 LAB — GLUCOSE, CAPILLARY
Glucose-Capillary: 150 mg/dL — ABNORMAL HIGH (ref 70–99)
Glucose-Capillary: 195 mg/dL — ABNORMAL HIGH (ref 70–99)

## 2023-05-26 LAB — CBC WITH DIFFERENTIAL/PLATELET
Abs Immature Granulocytes: 0.02 10*3/uL (ref 0.00–0.07)
Basophils Absolute: 0 10*3/uL (ref 0.0–0.1)
Basophils Relative: 1 %
Eosinophils Absolute: 0.2 10*3/uL (ref 0.0–0.5)
Eosinophils Relative: 2 %
HCT: 40.3 % (ref 39.0–52.0)
Hemoglobin: 14 g/dL (ref 13.0–17.0)
Immature Granulocytes: 0 %
Lymphocytes Relative: 44 %
Lymphs Abs: 3.6 10*3/uL (ref 0.7–4.0)
MCH: 31.1 pg (ref 26.0–34.0)
MCHC: 34.7 g/dL (ref 30.0–36.0)
MCV: 89.6 fL (ref 80.0–100.0)
Monocytes Absolute: 0.7 10*3/uL (ref 0.1–1.0)
Monocytes Relative: 9 %
Neutro Abs: 3.6 10*3/uL (ref 1.7–7.7)
Neutrophils Relative %: 44 %
Platelets: 173 10*3/uL (ref 150–400)
RBC: 4.5 MIL/uL (ref 4.22–5.81)
RDW: 13.2 % (ref 11.5–15.5)
WBC: 8.2 10*3/uL (ref 4.0–10.5)
nRBC: 0 % (ref 0.0–0.2)

## 2023-05-26 LAB — HEMOGLOBIN A1C
Hgb A1c MFr Bld: 6.7 % — ABNORMAL HIGH (ref 4.8–5.6)
Mean Plasma Glucose: 145.59 mg/dL

## 2023-05-26 MED ORDER — INSULIN ASPART 100 UNIT/ML IJ SOLN
0.0000 [IU] | Freq: Three times a day (TID) | INTRAMUSCULAR | Status: DC
  Administered 2023-05-26: 2 [IU] via SUBCUTANEOUS
  Administered 2023-05-27 (×2): 3 [IU] via SUBCUTANEOUS
  Administered 2023-05-27: 5 [IU] via SUBCUTANEOUS
  Administered 2023-05-28: 1 [IU] via SUBCUTANEOUS
  Administered 2023-05-29: 3 [IU] via SUBCUTANEOUS
  Administered 2023-05-29: 2 [IU] via SUBCUTANEOUS
  Filled 2023-05-26 (×7): qty 1

## 2023-05-26 MED ORDER — INSULIN ASPART 100 UNIT/ML IJ SOLN
0.0000 [IU] | Freq: Every day | INTRAMUSCULAR | Status: DC
Start: 2023-05-26 — End: 2023-05-31
  Administered 2023-05-27: 3 [IU] via SUBCUTANEOUS
  Filled 2023-05-26: qty 1

## 2023-05-26 MED ORDER — ACETAMINOPHEN 650 MG RE SUPP: 650.0000 mg | Freq: Four times a day (QID) | RECTAL | Status: DC | PRN

## 2023-05-26 MED ORDER — HYDROMORPHONE HCL 1 MG/ML IJ SOLN
1.0000 mg | Freq: Once | INTRAMUSCULAR | Status: AC
  Administered 2023-05-26: 1 mg via INTRAVENOUS
  Filled 2023-05-26: qty 1

## 2023-05-26 MED ORDER — DEXAMETHASONE SODIUM PHOSPHATE 4 MG/ML IJ SOLN
4.0000 mg | Freq: Four times a day (QID) | INTRAMUSCULAR | Status: AC
  Administered 2023-05-26 – 2023-05-27 (×2): 4 mg via INTRAVENOUS
  Filled 2023-05-26 (×2): qty 1

## 2023-05-26 MED ORDER — VITAMIN B-12 1000 MCG PO TABS
1000.0000 ug | ORAL_TABLET | Freq: Every day | ORAL | Status: DC
  Administered 2023-05-26 – 2023-05-31 (×6): 1000 ug via ORAL
  Filled 2023-05-26 (×6): qty 1

## 2023-05-26 MED ORDER — TIZANIDINE HCL 4 MG PO TABS
4.0000 mg | ORAL_TABLET | Freq: Three times a day (TID) | ORAL | Status: DC
Start: 2023-05-26 — End: 2023-05-31
  Administered 2023-05-26 – 2023-05-31 (×15): 4 mg via ORAL
  Filled 2023-05-26 (×15): qty 1

## 2023-05-26 MED ORDER — METOPROLOL SUCCINATE ER 25 MG PO TB24
12.5000 mg | ORAL_TABLET | Freq: Every day | ORAL | Status: DC
  Administered 2023-05-26 – 2023-05-31 (×6): 12.5 mg via ORAL
  Filled 2023-05-26 (×6): qty 1

## 2023-05-26 MED ORDER — FUROSEMIDE 20 MG PO TABS
20.0000 mg | ORAL_TABLET | Freq: Every day | ORAL | Status: DC
  Administered 2023-05-26 – 2023-05-31 (×6): 20 mg via ORAL
  Filled 2023-05-26 (×6): qty 1

## 2023-05-26 MED ORDER — SODIUM CHLORIDE 0.9 % IV SOLN: INTRAVENOUS | Status: AC

## 2023-05-26 MED ORDER — MAGNESIUM HYDROXIDE 400 MG/5ML PO SUSP: 30.0000 mL | Freq: Every day | ORAL | Status: DC | PRN

## 2023-05-26 MED ORDER — ONDANSETRON HCL 4 MG/2ML IJ SOLN
4.0000 mg | Freq: Once | INTRAMUSCULAR | Status: AC
  Administered 2023-05-26: 4 mg via INTRAVENOUS
  Filled 2023-05-26: qty 2

## 2023-05-26 MED ORDER — ONDANSETRON HCL 4 MG/2ML IJ SOLN: 4.0000 mg | Freq: Four times a day (QID) | INTRAMUSCULAR | Status: DC | PRN

## 2023-05-26 MED ORDER — TRAZODONE HCL 50 MG PO TABS
25.0000 mg | ORAL_TABLET | Freq: Every evening | ORAL | Status: DC | PRN
  Administered 2023-05-26: 25 mg via ORAL
  Filled 2023-05-26: qty 1

## 2023-05-26 MED ORDER — CLONAZEPAM 1 MG PO TABS
1.0000 mg | ORAL_TABLET | Freq: Every evening | ORAL | Status: DC | PRN
  Administered 2023-05-26: 1 mg via ORAL
  Filled 2023-05-26: qty 1

## 2023-05-26 MED ORDER — DEXAMETHASONE SODIUM PHOSPHATE 4 MG/ML IJ SOLN
3.0000 mg | Freq: Four times a day (QID) | INTRAMUSCULAR | Status: AC
  Administered 2023-05-27 (×4): 3 mg via INTRAVENOUS
  Filled 2023-05-26 (×4): qty 0.75

## 2023-05-26 MED ORDER — ORAL CARE MOUTH RINSE: 15.0000 mL | OROMUCOSAL | Status: DC | PRN

## 2023-05-26 MED ORDER — LAMOTRIGINE 25 MG PO TABS
75.0000 mg | ORAL_TABLET | Freq: Two times a day (BID) | ORAL | Status: DC
  Administered 2023-05-26 – 2023-05-31 (×11): 75 mg via ORAL
  Filled 2023-05-26 (×11): qty 3

## 2023-05-26 MED ORDER — ONDANSETRON HCL 4 MG PO TABS: 4.0000 mg | ORAL_TABLET | Freq: Four times a day (QID) | ORAL | Status: DC | PRN

## 2023-05-26 MED ORDER — DEXAMETHASONE SODIUM PHOSPHATE 10 MG/ML IJ SOLN
10.0000 mg | Freq: Once | INTRAMUSCULAR | Status: AC
  Administered 2023-05-26: 10 mg via INTRAVENOUS
  Filled 2023-05-26: qty 1

## 2023-05-26 MED ORDER — LOSARTAN POTASSIUM 50 MG PO TABS
100.0000 mg | ORAL_TABLET | Freq: Every day | ORAL | Status: DC
  Administered 2023-05-26 – 2023-05-31 (×6): 100 mg via ORAL
  Filled 2023-05-26 (×6): qty 2

## 2023-05-26 MED ORDER — PANTOPRAZOLE SODIUM 40 MG PO TBEC
40.0000 mg | DELAYED_RELEASE_TABLET | Freq: Every day | ORAL | Status: DC
  Administered 2023-05-26 – 2023-05-31 (×6): 40 mg via ORAL
  Filled 2023-05-26 (×6): qty 1

## 2023-05-26 MED ORDER — CARBIDOPA-LEVODOPA ER 50-200 MG PO TBCR
1.0000 | EXTENDED_RELEASE_TABLET | Freq: Every day | ORAL | Status: DC
  Administered 2023-05-26 – 2023-05-30 (×5): 1 via ORAL
  Filled 2023-05-26 (×5): qty 1

## 2023-05-26 MED ORDER — VITAMIN D3 25 MCG (1000 UNIT) PO TABS
1000.0000 [IU] | ORAL_TABLET | Freq: Every day | ORAL | Status: DC
  Administered 2023-05-26 – 2023-05-31 (×6): 1000 [IU] via ORAL
  Filled 2023-05-26 (×12): qty 1

## 2023-05-26 MED ORDER — HYDROMORPHONE HCL 1 MG/ML IJ SOLN
1.0000 mg | INTRAMUSCULAR | Status: DC | PRN
  Administered 2023-05-29 (×2): 1 mg via INTRAVENOUS
  Filled 2023-05-26 (×2): qty 1

## 2023-05-26 MED ORDER — ENOXAPARIN SODIUM 80 MG/0.8ML IJ SOSY
70.0000 mg | PREFILLED_SYRINGE | INTRAMUSCULAR | Status: DC
Start: 2023-05-26 — End: 2023-05-31
  Administered 2023-05-26 – 2023-05-31 (×5): 70 mg via SUBCUTANEOUS
  Filled 2023-05-26 (×5): qty 0.7

## 2023-05-26 MED ORDER — HYDROCODONE-ACETAMINOPHEN 10-325 MG PO TABS
1.0000 | ORAL_TABLET | Freq: Three times a day (TID) | ORAL | Status: DC | PRN
  Administered 2023-05-26 – 2023-05-29 (×5): 1 via ORAL
  Filled 2023-05-26 (×5): qty 1

## 2023-05-26 MED ORDER — ACETAMINOPHEN 325 MG PO TABS: 650.0000 mg | ORAL_TABLET | Freq: Four times a day (QID) | ORAL | Status: DC | PRN

## 2023-05-26 MED ORDER — HYDROCHLOROTHIAZIDE 25 MG PO TABS
25.0000 mg | ORAL_TABLET | Freq: Every day | ORAL | Status: DC
  Administered 2023-05-26 – 2023-05-31 (×6): 25 mg via ORAL
  Filled 2023-05-26 (×6): qty 1

## 2023-05-26 MED ORDER — CARBIDOPA-LEVODOPA 25-100 MG PO TABS
2.0000 | ORAL_TABLET | Freq: Three times a day (TID) | ORAL | Status: DC
  Administered 2023-05-26 – 2023-05-31 (×15): 2 via ORAL
  Filled 2023-05-26 (×15): qty 2

## 2023-05-26 MED ORDER — DULOXETINE HCL 30 MG PO CPEP
60.0000 mg | ORAL_CAPSULE | Freq: Every day | ORAL | Status: DC
  Administered 2023-05-26 – 2023-05-31 (×6): 60 mg via ORAL
  Filled 2023-05-26 (×6): qty 2

## 2023-05-26 MED ORDER — PREGABALIN 50 MG PO CAPS
100.0000 mg | ORAL_CAPSULE | Freq: Three times a day (TID) | ORAL | Status: DC
  Administered 2023-05-26 – 2023-05-31 (×15): 100 mg via ORAL
  Filled 2023-05-26 (×15): qty 2

## 2023-05-26 NOTE — Progress Notes (Signed)
 Progress Note   Patient: Thomas Cole QMV:[id number] DOB: 26-Nov-1970 DOA: 05/25/2023     0 DOS: the patient was seen and examined on 05/26/2023   Brief hospital course: Taken from H&P.  Thomas Cole is a 53 y.o. male with medical history significant for anxiety, depression, asthma, type 2 diabetes mellitus, GERD, Parkinsonism, hypertension, migraine and seizure disorder, who presented to the emergency room with acute onset of intractable low back pain.  He admits to right hip pain with decreased range of motion due to pain.  The patient underwent L4-L5 microdiscectomy on 04/14/2023.   On presentation stable vital and labs. Lumbar spine MRI without contrast revealed the following: 1. Status post right L4-5 laminectomy with unchanged severe spinal canal and bilateral neural foraminal stenosis. 2. Unchanged severe spinal canal stenosis at L2-L3 and L3-L4. 3. Unchanged moderate left L5-S1 neural foraminal stenosis.  Patient was admitted for pain control and neurosurgery was consulted.  2/23: Vital stable except borderline tachycardia,MRI of right hip was negative for any acute abnormality, did show some right sided posterior paraspinal intramuscular edema and degenerative changes.  Neurosurgery is recommending high-dose steroid and if continue to have significant pain then they might take him back to the OR for further decompression.    Assessment and Plan: * Intractable low back pain Patient with recent microdiscectomy surgery with neurosurgery. Significant and worsening lower back pain, lumbar MRI with severe spinal canal and bilateral neuronal foraminal stenosis. Right hip MRI was negative for any acute significant abnormality -Neurosurgery is recommending high-dose steroid and if continue to have significant pain then they might take him back to the OR for further decompression -High-dose Decadron was added -Continue with pain management  Essential hypertension - We will continue  antihypertensive therapy.  Type 2 diabetes mellitus with peripheral neuropathy (HCC) Patient was on metformin at home. -Started on moderate SSI-might need more insulin as he is getting steroid. - We will continue Lyrica - We will hold off metformin.  Anxiety and depression - We will continue Klonopin and Cymbalta.  Parkinsonism (HCC) - We will continue Sinemet.   Subjective: Patient was having 5/10 back pain while lying still, pain increased with any small body movement.  Physical Exam: Vitals:   05/26/23 0345 05/26/23 0429 05/26/23 0844 05/26/23 1056  BP:  136/88 108/63 108/63  Pulse:  (!) 103 84 84  Resp:  18 18   Temp: 98 F (36.7 C) (!) 97.5 F (36.4 C) 97.6 F (36.4 C)   TempSrc: Oral  Oral   SpO2:  98% 94%   Weight:      Height:       General.  Obese gentleman, in no acute distress. Pulmonary.  Lungs clear bilaterally, normal respiratory effort. CV.  Regular rate and rhythm, no JVD, rub or murmur. Abdomen.  Soft, nontender, nondistended, BS positive. CNS.  Alert and oriented .  No focal neurologic deficit. Extremities.  No edema, no cyanosis, pulses intact and symmetrical. Psychiatry.  Judgment and insight appears normal.   Data Reviewed: Prior data reviewed  Family Communication: Discussed with patient  Disposition: Status is: Observation The patient will require care spanning > 2 midnights and should be moved to inpatient because: Severity of illness  Planned Discharge Destination: Home   Time spent:  minutes  This record has been created using Conservation officer, historic buildings. Errors have been sought and corrected,but may not always be located. Such creation errors do not reflect on the standard of care.   Author: Arnetha Courser,  MD 05/26/2023 12:58 PM  For on call review [email or web address].

## 2023-05-26 NOTE — Plan of Care (Signed)
  Problem: Education: Goal: Knowledge of General Education information will improve Description: Including pain rating scale, medication(s)/side effects and non-pharmacologic comfort measures Outcome: Progressing   Problem: Activity: Goal: Risk for activity intolerance will decrease Outcome: Progressing   Problem: Safety: Goal: Ability to remain free from injury will improve Outcome: Progressing   Problem: Pain Managment: Goal: General experience of comfort will improve and/or be controlled Outcome: Progressing

## 2023-05-26 NOTE — Hospital Course (Addendum)
 Taken from H&P.  Thomas Cole is a 53 y.o. male with medical history significant for anxiety, depression, asthma, type 2 diabetes mellitus, GERD, Parkinsonism, hypertension, migraine and seizure disorder, who presented to the emergency room with acute onset of intractable low back pain.  He admits to right hip pain with decreased range of motion due to pain.  The patient underwent L4-L5 microdiscectomy on 04/14/2023.   On presentation stable vital and labs. Lumbar spine MRI without contrast revealed the following: 1. Status post right L4-5 laminectomy with unchanged severe spinal canal and bilateral neural foraminal stenosis. 2. Unchanged severe spinal canal stenosis at L2-L3 and L3-L4. 3. Unchanged moderate left L5-S1 neural foraminal stenosis.  Patient was admitted for pain control and neurosurgery was consulted.  2/23: Vital stable except borderline tachycardia,MRI of right hip was negative for any acute abnormality, did show some right sided posterior paraspinal intramuscular edema and degenerative changes.  Neurosurgery is recommending high-dose steroid and if continue to have significant pain then they might take him back to the OR for further decompression.  2/24:Vital Stable, pain little better with steroid.  2/25: Vital stable, going for lumbar laminectomy from L2-L5 to decompress the stenosis today.  2/26: Vital stable, s/p Lumbar laminectomy medial facetectomy lateral recess decompression from L2-L5 with neurosurgery yesterday, labs with leukocytosis today at 13.9 likely reactive after the surgery, hemoglobin decreased to 12.9 from 14, likely will need to stay 2-3 more days per neurosurgery.  2/28: Patient remained hemodynamically stable.  Drain was removed by neurosurgery today and they cleared him for discharge.  Able to ambulate with PT, continue to have some pain but stating that it is improving.  Patient has pain medications already on board which she will continue.  Flexeril  was provided to use as needed for muscle relaxation.  Patient will continue on current medications and need to have a close follow-up with his providers for further management.

## 2023-05-26 NOTE — H&P (View-Only) (Signed)
 Consulting Department:  Inpatient medicine  Primary Physician:  Kandyce Rud, MD  Chief Complaint: Intractable back pain with radiculopathy  History of Present Illness: 05/26/2023 Thomas Cole is a 53 y.o. male who presents with the chief complaint of intractable back pain with radiculopathy.  Of note he has had a recent microdiscectomy for acute weakness and low lumbar radiculopathy.  He states that this pain has been significantly improved, however he has had a worsening exacerbation of back pain and upper buttocks pain.  He states that this is quite different than his recent exacerbation.  It has limited his ability to ambulate, he has tried steroids, rest, however his pain has become so severe he is needed to have inpatient admission.  He has difficulty with ambulation, he does not have any bowel or bladder dysfunction at this time, but is now unable to reliably ambulate without falling.  He does feel some numbness and tingling going into his buttocks and lateral/upper thigh.  He feels better when he is in the fetal position, but anytime he extends his back or tries to stand he gets nonrelenting pain and give way sensation in his right predominantly but left lower extremity as well.  The symptoms are causing a significant impact on the patient's life.   Review of Systems:  A 10 point review of systems is negative, except for the pertinent positives and negatives detailed in the HPI.  Past Medical History: Past Medical History:  Diagnosis Date   Anxiety    Asthma    Back problem    Benign essential hypertension 11/30/2016   Cervical spondylosis without myelopathy 09/17/2012   Complication of anesthesia    occ takes longer to wake   Depression    Diabetes mellitus without complication (HCC) 02/2020   Type 2   GERD (gastroesophageal reflux disease)    Hypertension    Labile hypertension 03/19/2013   No current neuro changes, headache much improved. No clear sign of intracranial  bleed. NO indication for head CT>  BP much better now... Fluctuates a lot.  Will eval for pheochromocytoma. As recommended at last cardiology OV.. Will prescribe hydralazine to use as needed for BP fluctuations.  Close follow up with PCP.    Major depressive disorder, recurrent episode, severe (HCC) 06/23/2015   Migraine headache    Migraines 11/11/2013   S/P appendectomy 04/09/2011   Seizure (HCC) 11/30/2016   Seizures (HCC)    last 87   Shortness of breath    SOB (shortness of breath) 08/06/2014   - [facility] eval with cpst 02/20/2008 exercise testing demonstrateed normal functional capabilities and a normal cardiopulmonary response to exercise. - 09/16/2014  Walked RA x 3 laps @ 185 ft each stopped due to end of study/ nl pace/ no desat  - spirometry 09/16/2014 > no obst/ min restriction       Past Surgical History: Past Surgical History:  Procedure Laterality Date   ANTERIOR CERVICAL CORPECTOMY N/A 09/15/2012   Procedure: Cervical Three-Four,Cervical Six-Seven Anterior cervical decompression/diskectomy fusion with Cervical Four Corpectomy;  Surgeon: Barnett Abu, MD;  Location: [facility];  Service: Neurosurgery;  Laterality: N/A;  Cervical Three-Four,Cervical Six-Seven  Anterior cervical decompression/diskectomy fusion with Cervical four Corpectomy   APPENDECTOMY  12   CERVICAL DISC SURGERY     CHOLECYSTECTOMY N/A 03/29/2017   Procedure: LAPAROSCOPIC CHOLECYSTECTOMY;  Surgeon: Lattie Haw, MD;  Location: [facility] ORS;  Service: General;  Laterality: N/A;   COLONOSCOPY WITH PROPOFOL N/A 10/23/2019   Procedure: COLONOSCOPY WITH  PROPOFOL;  Surgeon: Earline Mayotte, MD;  Location: [facility] ENDOS[facility];  Service: Endoscopy;  Laterality: N/A;   ESOPHAGOGASTRODUODENOSCOPY (EGD) WITH PROPOFOL N/A 10/23/2019   Procedure: ESOPHAGOGASTRODUODENOSCOPY (EGD) WITH PROPOFOL;  Surgeon: Earline Mayotte, MD;  Location: [facility] ENDOSCOPY;  Service: Endoscopy;  Laterality: N/A;   KNEE SURGERY     bilateral   LUMBAR  LAMINECTOMY/DECOMPRESSION MICRODISCECTOMY Right 04/14/2023   Procedure: L4-5 Microdiscectomy;  Surgeon: Lovenia Kim, MD;  Location: [facility] ORS;  Service: Neurosurgery;  Laterality: Right;   POSTERIOR FUSION CERVICAL SPINE      Allergies: Allergies as of 05/25/2023 - Review Complete 05/25/2023  Allergen Reaction Noted   Amitriptyline Other (See Comments) 06/16/2015   Bee venom Itching and Swelling 04/09/2011   Chlorhexidine Itching 09/03/2012   Carbamazepine Itching and Rash 03/23/2010   Hm lidocaine patch [lidocaine] Hives and Rash 05/09/2023   Meloxicam Itching and Rash 09/03/2012   Morphine and codeine Anxiety 03/29/2017   Sulfa antibiotics Itching, Rash, and Other (See Comments) 04/09/2011    Medications:  Current Facility-Administered Medications:    0.9 %  sodium chloride infusion, , Intravenous, Continuous, Mansy, Jan A, MD, Last Rate: 40 mL/hr at 05/26/23 0346, New Bag at 05/26/23 0346   acetaminophen (TYLENOL) tablet 650 mg, 650 mg, Oral, Q6H PRN **OR** acetaminophen (TYLENOL) suppository 650 mg, 650 mg, Rectal, Q6H PRN, Mansy, Jan A, MD   carbidopa-levodopa (SINEMET CR) 50-200 MG per tablet controlled release 1 tablet, 1 tablet, Oral, QHS, Mansy, Jan A, MD   carbidopa-levodopa (SINEMET IR) 25-100 MG per tablet immediate release 2 tablet, 2 tablet, Oral, TID, Mansy, Jan A, MD   cholecalciferol (VITAMIN D3) tablet 1,000 Units, 1,000 Units, Oral, Daily, Mansy, Jan A, MD   clonazePAM (KLONOPIN) tablet 1 mg, 1 mg, Oral, QHS PRN, Mansy, Jan A, MD   cyanocobalamin (VITAMIN B12) tablet 1,000 mcg, 1,000 mcg, Oral, Daily, Mansy, Jan A, MD   DULoxetine (CYMBALTA) DR capsule 60 mg, 60 mg, Oral, QPC breakfast, Mansy, Jan A, MD   enoxaparin (LOVENOX) injection 70 mg, 70 mg, Subcutaneous, Q24H, Mansy, Jan A, MD   furosemide (LASIX) tablet 20 mg, 20 mg, Oral, Daily, Mansy, Jan A, MD   hydrochlorothiazide (HYDRODIURIL) tablet 25 mg, 25 mg, Oral, Daily, Mansy, Jan A, MD    HYDROcodone-acetaminophen [facility]) 10-325 MG per tablet 1 tablet, 1 tablet, Oral, Q8H PRN, Mansy, Jan A, MD, 1 tablet at 05/26/23 0453   HYDROmorphone (DILAUDID) injection 1 mg, 1 mg, Intravenous, Q4H PRN, Mansy, Jan A, MD   lamoTRIgine (LAMICTAL) tablet 25 mg, 25 mg, Oral, QHS, Mansy, Jan A, MD   losartan (COZAAR) tablet 100 mg, 100 mg, Oral, Daily, Mansy, Jan A, MD   magnesium hydroxide (MILK OF MAGNESIA) suspension 30 mL, 30 mL, Oral, Daily PRN, Mansy, Jan A, MD   metoprolol succinate (TOPROL-XL) 24 hr tablet 12.5 mg, 12.5 mg, Oral, Daily, Mansy, Jan A, MD   ondansetron (ZOFRAN) tablet 4 mg, 4 mg, Oral, Q6H PRN **OR** ondansetron (ZOFRAN) injection 4 mg, 4 mg, Intravenous, Q6H PRN, Mansy, Jan A, MD   Oral care mouth rinse, 15 mL, Mouth Rinse, PRN, Arnetha Courser, MD   pregabalin (LYRICA) capsule 100 mg, 100 mg, Oral, TID, Mansy, Jan A, MD   tiZANidine (ZANAFLEX) tablet 4 mg, 4 mg, Oral, TID, Mansy, Jan A, MD   traZODone (DESYREL) tablet 25 mg, 25 mg, Oral, QHS PRN, Mansy, Vernetta Honey, MD   Social History: Social History   Tobacco Use   Smoking status: Never   Smokeless  tobacco: Never  Vaping Use   Vaping status: Never Used  Substance Use Topics   Alcohol use: No    Alcohol/week: 0.0 standard drinks of alcohol   Drug use: No    Family Medical History: Family History  Problem Relation Age of Onset   Coronary artery disease Mother    Hypertension Mother    Mental illness Mother    Arthritis Mother    Asthma Mother    Diabetes Father    Coronary artery disease Father    Heart disease Father    Arthritis Father    Arthritis Maternal Grandmother    Heart disease Maternal Grandmother    Hypertension Maternal Grandmother    Arthritis Maternal Grandfather    Heart disease Maternal Grandfather    Hypertension Maternal Grandfather    Diabetes Paternal Grandmother    Arthritis Paternal Grandmother    Heart disease Paternal Grandmother    Hypertension Paternal Grandmother    Diabetes  Paternal Grandfather    Arthritis Paternal Grandfather    Heart disease Paternal Grandfather    Hypertension Paternal Grandfather    Cancer Neg Hx    Stroke Neg Hx     Physical Examination: Vitals:   05/26/23 0429 05/26/23 0844  BP: 136/88 108/63  Pulse: (!) 103 84  Resp: 18 18  Temp: (!) 97.5 F (36.4 C) 97.6 F (36.4 C)  SpO2: 98% 94%     General: Patient is well developed, well nourished, calm, collected, and in no apparent distress.  NEUROLOGICAL:  General: I in a significant amount of pain with certain movements. Awake, alert, oriented to person, place, and time.  Pupils equal round and reactive to light.  Facial tone is symmetric.  Tongue protrusion is midline.  There is no pronator drift.  ROM of spine: Decreased extension, causes severe radiation of his pain  Strength: On confrontational exam he is quite strong with the exception of giveaway weakness in his bilateral quadriceps and iliopsoas.  These are approximately 4 out of 5 and pain limited.  His reflexes are present but diminished bilaterally.  Ambulation is limited, at times with a rollator/Mcmahan however any upright stance causes severe shooting pains into his legs.  Imaging: MR LUMBAR SPINE WO CONTRAST Result Date: 05/26/2023 CLINICAL DATA:  Low back pain with recent laminectomy EXAM: MRI LUMBAR SPINE WITHOUT CONTRAST TECHNIQUE: Multiplanar, multisequence MR imaging of the lumbar spine was performed. No intravenous contrast was administered. COMPARISON:  04/12/2023 and 05/23/2023 FINDINGS: Segmentation:  Standard. Alignment:  Physiologic. Vertebrae: Status post right L4-5 laminectomy. No acute fracture or discitis-osteomyelitis. Conus medullaris and cauda equina: Conus extends to the L1 level. Conus and cauda equina appear normal. Paraspinal and other soft tissues: Negative. Disc levels: L1-L2: Normal disc space and facet joints. No spinal canal stenosis. No neural foraminal stenosis. L2-L3: Intermediate sized disc  bulge. Unchanged severe spinal canal stenosis. No neural foraminal stenosis. L3-L4: Intermediate sized disc bulge. Unchanged severe spinal canal stenosis. No neural foraminal stenosis. L4-L5: Status post right laminectomy. Intermediate sized disc bulge. Severe spinal canal stenosis. Severe bilateral neural foraminal stenosis. L5-S1: Small disc bulge with superimposed central protrusion and mild facet hypertrophy. Left lateral recess narrowing without central spinal canal stenosis. Mild right and moderate left neural foraminal stenosis. Visualized sacrum: Normal. IMPRESSION: 1. Status post right L4-5 laminectomy with unchanged severe spinal canal and bilateral neural foraminal stenosis. 2. Unchanged severe spinal canal stenosis at L2-L3 and L3-L4. 3. Unchanged moderate left L5-S1 neural foraminal stenosis. Electronically Signed   By: Caryn Bee  Chase Picket M.D.   On: 05/26/2023 00:15     I have personally reviewed the images, he has known continued stenosis at the proximal levels, his disk has been resected, but he does continue to have stenosis centrally as well. When reviewing his upper levels he does have T2 signal in his more proximal disk herniations/joints which could argue for a more acute progressive of his pain as his current dermatomal involvement is more in the L2/3 Dermatome rather than the 4/5 he previously presented with.   Labs:    Latest Ref Rng & Units 05/26/2023    5:09 AM 05/26/2023   12:10 AM 04/16/2023    5:05 AM  CBC  WBC 4.0 - 10.5 K/uL 8.4  8.2  13.8   Hemoglobin 13.0 - 17.0 g/dL 16.1  09.6  04.5   Hematocrit 39.0 - 52.0 % 39.7  40.3  43.9   Platelets 150 - 400 K/uL 174  173  203       Latest Ref Rng & Units 05/26/2023    5:09 AM 05/26/2023   12:10 AM 04/16/2023    5:05 AM  BMP  Glucose 70 - 99 mg/dL 92  409  811   BUN 6 - 20 mg/dL 26  21  25    Creatinine 0.61 - 1.24 mg/dL 9.14  7.82  9.56   Sodium 135 - 145 mmol/L 136  136  135   Potassium 3.5 - 5.1 mmol/L 3.9  3.8  3.5    Chloride 98 - 111 mmol/L 101  100  98   CO2 22 - 32 mmol/L 25  25  25    Calcium 8.9 - 10.3 mg/dL 9.4  9.6  9.5         Assessment and Plan: Thomas Cole is a pleasant 53 y.o. male with a history of a right sided lumbar radiculopathy and previously chronic back pain.  He presents with progressive worsening of a more proximal set of symptoms including pain in his L2 and 3 dermatomal distribution as well as pain limited weakness in his hip flexors and knee extensors as well as difficulty with walking.  He has been unable to ambulate safely at his home and has had falls.  He was he was admitted for his inability to ambulate and control his pain at home.  Or planning on a steroid evaluation, his repeat MRI does demonstrate continued severe stenosis, his previous disc herniation has been resected but as known previously he does have some stenosis proximally which did not seem to be symptomatic at the time of his disc herniation.  There is some T2 signal/STIR signal noted in the 2 3 and 3 4 discs.  This did appear to be at least mildly present on his last MRI, however now these levels appear to be symptomatic and limiting his ability to walk.  Will plan on reevaluating tomorrow after a steroid trial and IV pain medications to see whether or not he is able to improve his ambulation.  We did discuss that we may take him to the OR for a lumbar decompression given his functional decline     Lovenia Kim, MD/MSCR Dept. of Neurosurgery

## 2023-05-26 NOTE — Consult Note (Signed)
 Consulting Department:  Inpatient medicine  Primary Physician:  Kandyce Rud, MD  Chief Complaint: Intractable back pain with radiculopathy  History of Present Illness: 05/26/2023 Thomas Cole is a 53 y.o. male who presents with the chief complaint of intractable back pain with radiculopathy.  Of note he has had a recent microdiscectomy for acute weakness and low lumbar radiculopathy.  He states that this pain has been significantly improved, however he has had a worsening exacerbation of back pain and upper buttocks pain.  He states that this is quite different than his recent exacerbation.  It has limited his ability to ambulate, he has tried steroids, rest, however his pain has become so severe he is needed to have inpatient admission.  He has difficulty with ambulation, he does not have any bowel or bladder dysfunction at this time, but is now unable to reliably ambulate without falling.  He does feel some numbness and tingling going into his buttocks and lateral/upper thigh.  He feels better when he is in the fetal position, but anytime he extends his back or tries to stand he gets nonrelenting pain and give way sensation in his right predominantly but left lower extremity as well.  The symptoms are causing a significant impact on the patient's life.   Review of Systems:  A 10 point review of systems is negative, except for the pertinent positives and negatives detailed in the HPI.  Past Medical History: Past Medical History:  Diagnosis Date   Anxiety    Asthma    Back problem    Benign essential hypertension 11/30/2016   Cervical spondylosis without myelopathy 09/17/2012   Complication of anesthesia    occ takes longer to wake   Depression    Diabetes mellitus without complication (HCC) 02/2020   Type 2   GERD (gastroesophageal reflux disease)    Hypertension    Labile hypertension 03/19/2013   No current neuro changes, headache much improved. No clear sign of intracranial  bleed. NO indication for head CT>  BP much better now... Fluctuates a lot.  Will eval for pheochromocytoma. As recommended at last cardiology OV.. Will prescribe hydralazine to use as needed for BP fluctuations.  Close follow up with PCP.    Major depressive disorder, recurrent episode, severe (HCC) 06/23/2015   Migraine headache    Migraines 11/11/2013   S/P appendectomy 04/09/2011   Seizure (HCC) 11/30/2016   Seizures (HCC)    last 87   Shortness of breath    SOB (shortness of breath) 08/06/2014   - [facility] eval with cpst 02/20/2008 exercise testing demonstrateed normal functional capabilities and a normal cardiopulmonary response to exercise. - 09/16/2014  Walked RA x 3 laps @ 185 ft each stopped due to end of study/ nl pace/ no desat  - spirometry 09/16/2014 > no obst/ min restriction       Past Surgical History: Past Surgical History:  Procedure Laterality Date   ANTERIOR CERVICAL CORPECTOMY N/A 09/15/2012   Procedure: Cervical Three-Four,Cervical Six-Seven Anterior cervical decompression/diskectomy fusion with Cervical Four Corpectomy;  Surgeon: Barnett Abu, MD;  Location: [facility];  Service: Neurosurgery;  Laterality: N/A;  Cervical Three-Four,Cervical Six-Seven  Anterior cervical decompression/diskectomy fusion with Cervical four Corpectomy   APPENDECTOMY  12   CERVICAL DISC SURGERY     CHOLECYSTECTOMY N/A 03/29/2017   Procedure: LAPAROSCOPIC CHOLECYSTECTOMY;  Surgeon: Lattie Haw, MD;  Location: [facility] ORS;  Service: General;  Laterality: N/A;   COLONOSCOPY WITH PROPOFOL N/A 10/23/2019   Procedure: COLONOSCOPY WITH  PROPOFOL;  Surgeon: Earline Mayotte, MD;  Location: [facility] ENDOSCOPY;  Service: Endoscopy;  Laterality: N/A;   ESOPHAGOGASTRODUODENOSCOPY (EGD) WITH PROPOFOL N/A 10/23/2019   Procedure: ESOPHAGOGASTRODUODENOSCOPY (EGD) WITH PROPOFOL;  Surgeon: Earline Mayotte, MD;  Location: [facility] ENDOSCOPY;  Service: Endoscopy;  Laterality: N/A;   KNEE SURGERY     bilateral   LUMBAR  LAMINECTOMY/DECOMPRESSION MICRODISCECTOMY Right 04/14/2023   Procedure: L4-5 Microdiscectomy;  Surgeon: Lovenia Kim, MD;  Location: [facility] ORS;  Service: Neurosurgery;  Laterality: Right;   POSTERIOR FUSION CERVICAL SPINE      Allergies: Allergies as of 05/25/2023 - Review Complete 05/25/2023  Allergen Reaction Noted   Amitriptyline Other (See Comments) 06/16/2015   Bee venom Itching and Swelling 04/09/2011   Chlorhexidine Itching 09/03/2012   Carbamazepine Itching and Rash 03/23/2010   Hm lidocaine patch [lidocaine] Hives and Rash 05/09/2023   Meloxicam Itching and Rash 09/03/2012   Morphine and codeine Anxiety 03/29/2017   Sulfa antibiotics Itching, Rash, and Other (See Comments) 04/09/2011    Medications:  Current Facility-Administered Medications:    0.9 %  sodium chloride infusion, , Intravenous, Continuous, Mansy, Jan A, MD, Last Rate: 40 mL/hr at 05/26/23 0346, New Bag at 05/26/23 0346   acetaminophen (TYLENOL) tablet 650 mg, 650 mg, Oral, Q6H PRN **OR** acetaminophen (TYLENOL) suppository 650 mg, 650 mg, Rectal, Q6H PRN, Mansy, Jan A, MD   carbidopa-levodopa (SINEMET CR) 50-200 MG per tablet controlled release 1 tablet, 1 tablet, Oral, QHS, Mansy, Jan A, MD   carbidopa-levodopa (SINEMET IR) 25-100 MG per tablet immediate release 2 tablet, 2 tablet, Oral, TID, Mansy, Jan A, MD   cholecalciferol (VITAMIN D3) tablet 1,000 Units, 1,000 Units, Oral, Daily, Mansy, Jan A, MD   clonazePAM (KLONOPIN) tablet 1 mg, 1 mg, Oral, QHS PRN, Mansy, Jan A, MD   cyanocobalamin (VITAMIN B12) tablet 1,000 mcg, 1,000 mcg, Oral, Daily, Mansy, Jan A, MD   DULoxetine (CYMBALTA) DR capsule 60 mg, 60 mg, Oral, QPC breakfast, Mansy, Jan A, MD   enoxaparin (LOVENOX) injection 70 mg, 70 mg, Subcutaneous, Q24H, Mansy, Jan A, MD   furosemide (LASIX) tablet 20 mg, 20 mg, Oral, Daily, Mansy, Jan A, MD   hydrochlorothiazide (HYDRODIURIL) tablet 25 mg, 25 mg, Oral, Daily, Mansy, Jan A, MD    HYDROcodone-acetaminophen [facility]) 10-325 MG per tablet 1 tablet, 1 tablet, Oral, Q8H PRN, Mansy, Jan A, MD, 1 tablet at 05/26/23 0453   HYDROmorphone (DILAUDID) injection 1 mg, 1 mg, Intravenous, Q4H PRN, Mansy, Jan A, MD   lamoTRIgine (LAMICTAL) tablet 25 mg, 25 mg, Oral, QHS, Mansy, Jan A, MD   losartan (COZAAR) tablet 100 mg, 100 mg, Oral, Daily, Mansy, Jan A, MD   magnesium hydroxide (MILK OF MAGNESIA) suspension 30 mL, 30 mL, Oral, Daily PRN, Mansy, Jan A, MD   metoprolol succinate (TOPROL-XL) 24 hr tablet 12.5 mg, 12.5 mg, Oral, Daily, Mansy, Jan A, MD   ondansetron (ZOFRAN) tablet 4 mg, 4 mg, Oral, Q6H PRN **OR** ondansetron (ZOFRAN) injection 4 mg, 4 mg, Intravenous, Q6H PRN, Mansy, Jan A, MD   Oral care mouth rinse, 15 mL, Mouth Rinse, PRN, Arnetha Courser, MD   pregabalin (LYRICA) capsule 100 mg, 100 mg, Oral, TID, Mansy, Jan A, MD   tiZANidine (ZANAFLEX) tablet 4 mg, 4 mg, Oral, TID, Mansy, Jan A, MD   traZODone (DESYREL) tablet 25 mg, 25 mg, Oral, QHS PRN, Mansy, Vernetta Honey, MD   Social History: Social History   Tobacco Use   Smoking status: Never   Smokeless  tobacco: Never  Vaping Use   Vaping status: Never Used  Substance Use Topics   Alcohol use: No    Alcohol/week: 0.0 standard drinks of alcohol   Drug use: No    Family Medical History: Family History  Problem Relation Age of Onset   Coronary artery disease Mother    Hypertension Mother    Mental illness Mother    Arthritis Mother    Asthma Mother    Diabetes Father    Coronary artery disease Father    Heart disease Father    Arthritis Father    Arthritis Maternal Grandmother    Heart disease Maternal Grandmother    Hypertension Maternal Grandmother    Arthritis Maternal Grandfather    Heart disease Maternal Grandfather    Hypertension Maternal Grandfather    Diabetes Paternal Grandmother    Arthritis Paternal Grandmother    Heart disease Paternal Grandmother    Hypertension Paternal Grandmother    Diabetes  Paternal Grandfather    Arthritis Paternal Grandfather    Heart disease Paternal Grandfather    Hypertension Paternal Grandfather    Cancer Neg Hx    Stroke Neg Hx     Physical Examination: Vitals:   05/26/23 0429 05/26/23 0844  BP: 136/88 108/63  Pulse: (!) 103 84  Resp: 18 18  Temp: (!) 97.5 F (36.4 C) 97.6 F (36.4 C)  SpO2: 98% 94%     General: Patient is well developed, well nourished, calm, collected, and in no apparent distress.  NEUROLOGICAL:  General: I in a significant amount of pain with certain movements. Awake, alert, oriented to person, place, and time.  Pupils equal round and reactive to light.  Facial tone is symmetric.  Tongue protrusion is midline.  There is no pronator drift.  ROM of spine: Decreased extension, causes severe radiation of his pain  Strength: On confrontational exam he is quite strong with the exception of giveaway weakness in his bilateral quadriceps and iliopsoas.  These are approximately 4 out of 5 and pain limited.  His reflexes are present but diminished bilaterally.  Ambulation is limited, at times with a rollator/Nghiem however any upright stance causes severe shooting pains into his legs.  Imaging: MR LUMBAR SPINE WO CONTRAST Result Date: 05/26/2023 CLINICAL DATA:  Low back pain with recent laminectomy EXAM: MRI LUMBAR SPINE WITHOUT CONTRAST TECHNIQUE: Multiplanar, multisequence MR imaging of the lumbar spine was performed. No intravenous contrast was administered. COMPARISON:  04/12/2023 and 05/23/2023 FINDINGS: Segmentation:  Standard. Alignment:  Physiologic. Vertebrae: Status post right L4-5 laminectomy. No acute fracture or discitis-osteomyelitis. Conus medullaris and cauda equina: Conus extends to the L1 level. Conus and cauda equina appear normal. Paraspinal and other soft tissues: Negative. Disc levels: L1-L2: Normal disc space and facet joints. No spinal canal stenosis. No neural foraminal stenosis. L2-L3: Intermediate sized disc  bulge. Unchanged severe spinal canal stenosis. No neural foraminal stenosis. L3-L4: Intermediate sized disc bulge. Unchanged severe spinal canal stenosis. No neural foraminal stenosis. L4-L5: Status post right laminectomy. Intermediate sized disc bulge. Severe spinal canal stenosis. Severe bilateral neural foraminal stenosis. L5-S1: Small disc bulge with superimposed central protrusion and mild facet hypertrophy. Left lateral recess narrowing without central spinal canal stenosis. Mild right and moderate left neural foraminal stenosis. Visualized sacrum: Normal. IMPRESSION: 1. Status post right L4-5 laminectomy with unchanged severe spinal canal and bilateral neural foraminal stenosis. 2. Unchanged severe spinal canal stenosis at L2-L3 and L3-L4. 3. Unchanged moderate left L5-S1 neural foraminal stenosis. Electronically Signed   By: Caryn Bee  Chase Picket M.D.   On: 05/26/2023 00:15     I have personally reviewed the images, he has known continued stenosis at the proximal levels, his disk has been resected, but he does continue to have stenosis centrally as well. When reviewing his upper levels he does have T2 signal in his more proximal disk herniations/joints which could argue for a more acute progressive of his pain as his current dermatomal involvement is more in the L2/3 Dermatome rather than the 4/5 he previously presented with.   Labs:    Latest Ref Rng & Units 05/26/2023    5:09 AM 05/26/2023   12:10 AM 04/16/2023    5:05 AM  CBC  WBC 4.0 - 10.5 K/uL 8.4  8.2  13.8   Hemoglobin 13.0 - 17.0 g/dL 95.6  38.7  56.4   Hematocrit 39.0 - 52.0 % 39.7  40.3  43.9   Platelets 150 - 400 K/uL 174  173  203       Latest Ref Rng & Units 05/26/2023    5:09 AM 05/26/2023   12:10 AM 04/16/2023    5:05 AM  BMP  Glucose 70 - 99 mg/dL 92  332  951   BUN 6 - 20 mg/dL 26  21  25    Creatinine 0.61 - 1.24 mg/dL 8.84  1.66  0.63   Sodium 135 - 145 mmol/L 136  136  135   Potassium 3.5 - 5.1 mmol/L 3.9  3.8  3.5    Chloride 98 - 111 mmol/L 101  100  98   CO2 22 - 32 mmol/L 25  25  25    Calcium 8.9 - 10.3 mg/dL 9.4  9.6  9.5         Assessment and Plan: Mr. Jeansonne is a pleasant 53 y.o. male with a history of a right sided lumbar radiculopathy and previously chronic back pain.  He presents with progressive worsening of a more proximal set of symptoms including pain in his L2 and 3 dermatomal distribution as well as pain limited weakness in his hip flexors and knee extensors as well as difficulty with walking.  He has been unable to ambulate safely at his home and has had falls.  He was he was admitted for his inability to ambulate and control his pain at home.  Or planning on a steroid evaluation, his repeat MRI does demonstrate continued severe stenosis, his previous disc herniation has been resected but as known previously he does have some stenosis proximally which did not seem to be symptomatic at the time of his disc herniation.  There is some T2 signal/STIR signal noted in the 2 3 and 3 4 discs.  This did appear to be at least mildly present on his last MRI, however now these levels appear to be symptomatic and limiting his ability to walk.  Will plan on reevaluating tomorrow after a steroid trial and IV pain medications to see whether or not he is able to improve his ambulation.  We did discuss that we may take him to the OR for a lumbar decompression given his functional decline     Lovenia Kim, MD/MSCR Dept. of Neurosurgery

## 2023-05-26 NOTE — Assessment & Plan Note (Signed)
-   We will continue Lyrica. 

## 2023-05-26 NOTE — Assessment & Plan Note (Signed)
 We will continue Sinemet.

## 2023-05-26 NOTE — Plan of Care (Signed)

## 2023-05-26 NOTE — H&P (Addendum)
 [place]   PATIENT NAME: Thomas Cole    MR#:  [id number]  DATE OF BIRTH:  05/02/1970  DATE OF ADMISSION:  05/25/2023  PRIMARY CARE PHYSICIAN: Kandyce Rud, MD   Patient is coming from: Home  REQUESTING/REFERRING PHYSICIAN: Ward, Layla Maw, DO  CHIEF COMPLAINT:   Chief Complaint  Patient presents with   Back Pain    HISTORY OF PRESENT ILLNESS:  Thomas Cole is a 53 y.o. male with medical history significant for anxiety, depression, asthma, type 2 diabetes mellitus, GERD, Parkinsonism, hypertension, migraine and seizure disorder, who presented to the emergency room with acute onset of intractable low back pain.  He admits to right hip pain with decreased range of motion due to pain.  The patient underwent L4-L5 microdiscectomy on 04/14/2023.  He admitted to radiation of his low back pain to his buttocks.  He denied any paresthesias or focal muscle weakness.  No urinary or stool incontinence.  No saddle anesthesia.  He denied any fever or chills.  No nausea or vomiting or abdominal pain.  No dysuria, oliguria or hematuria or flank pain.  ED Course: When the patient came to the ER, BP was 139/92 with otherwise normal vital signs.  Labs revealed a BUN of 21 with otherwise unremarkable BMP.  CBC was within normal. EKG as reviewed by me : None Imaging: A lumbar spine x-ray on 2/20 revealed multilevel mild height loss. MRI of the lumbar spine without contrast revealed the following: 1. Status post right L4-5 laminectomy with unchanged severe spinal canal and bilateral neural foraminal stenosis. 2. Unchanged severe spinal canal stenosis at L2-L3 and L3-L4. 3. Unchanged moderate left L5-S1 neural foraminal stenosis.  The patient was given 1 mg of IV Dilaudid twice and 4 mg of IV Zofran.  He will be admitted to an observation medical-surgical bed for further evaluation and management. PAST MEDICAL HISTORY:   Past Medical History:  Diagnosis Date   Anxiety    Asthma    Back  problem    Benign essential hypertension 11/30/2016   Cervical spondylosis without myelopathy 09/17/2012   Complication of anesthesia    occ takes longer to wake   Depression    Diabetes mellitus without complication (HCC) 02/2020   Type 2   GERD (gastroesophageal reflux disease)    Hypertension    Labile hypertension 03/19/2013   No current neuro changes, headache much improved. No clear sign of intracranial bleed. NO indication for head CT>  BP much better now... Fluctuates a lot.  Will eval for pheochromocytoma. As recommended at last cardiology OV.. Will prescribe hydralazine to use as needed for BP fluctuations.  Close follow up with PCP.    Major depressive disorder, recurrent episode, severe (HCC) 06/23/2015   Migraine headache    Migraines 11/11/2013   S/P appendectomy 04/09/2011   Seizure (HCC) 11/30/2016   Seizures (HCC)    last 87   Shortness of breath    SOB (shortness of breath) 08/06/2014   - [facility] eval with cpst 02/20/2008 exercise testing demonstrateed normal functional capabilities and a normal cardiopulmonary response to exercise. - 09/16/2014  Walked RA x 3 laps @ 185 ft each stopped due to end of study/ nl pace/ no desat  - spirometry 09/16/2014 > no obst/ min restriction       PAST SURGICAL HISTORY:   Past Surgical History:  Procedure Laterality Date   ANTERIOR CERVICAL CORPECTOMY N/A 09/15/2012   Procedure: Cervical Three-Four,Cervical Six-Seven Anterior cervical decompression/diskectomy fusion  with Cervical Four Corpectomy;  Surgeon: Barnett Abu, MD;  Location: [facility];  Service: Neurosurgery;  Laterality: N/A;  Cervical Three-Four,Cervical Six-Seven  Anterior cervical decompression/diskectomy fusion with Cervical four Corpectomy   APPENDECTOMY  12   CERVICAL DISC SURGERY     CHOLECYSTECTOMY N/A 03/29/2017   Procedure: LAPAROSCOPIC CHOLECYSTECTOMY;  Surgeon: Lattie Haw, MD;  Location: [facility];  Service: General;  Laterality: N/A;   COLONOSCOPY WITH PROPOFOL  N/A 10/23/2019   Procedure: COLONOSCOPY WITH PROPOFOL;  Surgeon: Earline Mayotte, MD;  Location: [facility];  Service: Endoscopy;  Laterality: N/A;   ESOPHAGOGASTRODUODENOSCOPY (EGD) WITH PROPOFOL N/A 10/23/2019   Procedure: ESOPHAGOGASTRODUODENOSCOPY (EGD) WITH PROPOFOL;  Surgeon: Earline Mayotte, MD;  Location: [facility];  Service: Endoscopy;  Laterality: N/A;   KNEE SURGERY     bilateral   LUMBAR LAMINECTOMY/DECOMPRESSION MICRODISCECTOMY Right 04/14/2023   Procedure: L4-5 Microdiscectomy;  Surgeon: Lovenia Kim, MD;  Location: [facility] OR[facility];  Service: Neurosurgery;  Laterality: Right;   POSTERIOR FUSION CERVICAL SPINE      SOCIAL HISTORY:   Social History   Tobacco Use   Smoking status: Never   Smokeless tobacco: Never  Substance Use Topics   Alcohol use: No    Alcohol/week: 0.0 standard drinks of alcohol    FAMILY HISTORY:   Family History  Problem Relation Age of Onset   Coronary artery disease Mother    Hypertension Mother    Mental illness Mother    Arthritis Mother    Asthma Mother    Diabetes Father    Coronary artery disease Father    Heart disease Father    Arthritis Father    Arthritis Maternal Grandmother    Heart disease Maternal Grandmother    Hypertension Maternal Grandmother    Arthritis Maternal Grandfather    Heart disease Maternal Grandfather    Hypertension Maternal Grandfather    Diabetes Paternal Grandmother    Arthritis Paternal Grandmother    Heart disease Paternal Grandmother    Hypertension Paternal Grandmother    Diabetes Paternal Grandfather    Arthritis Paternal Grandfather    Heart disease Paternal Grandfather    Hypertension Paternal Grandfather    Cancer Neg Hx    Stroke Neg Hx     DRUG ALLERGIES:   Allergies  Allergen Reactions   Amitriptyline Other (See Comments)    Felt really bad- psychologically   Bee Venom Itching and Swelling   Chlorhexidine Itching    REACTION TO WIPES/ CLOTHES ONLY==he can tolerate the  SCRUB LIQUID   Carbamazepine Itching and Rash   Hm Lidocaine Patch [Lidocaine] Hives and Rash   Meloxicam Itching and Rash   Morphine And Codeine Anxiety    Makes him restless  Makes him restless    Sulfa Antibiotics Itching, Rash and Other (See Comments)    REVIEW OF SYSTEMS:   ROS As per history of present illness. All pertinent systems were reviewed above. Constitutional, HEENT, cardiovascular, respiratory, GI, GU, musculoskeletal, neuro, psychiatric, endocrine, integumentary and hematologic systems were reviewed and are otherwise negative/unremarkable except for positive findings mentioned above in the HPI.   MEDICATIONS AT HOME:   Prior to Admission medications   Medication Sig Start Date End Date Taking? Authorizing Provider  carbidopa-levodopa (SINEMET CR) 50-200 MG tablet Take 1 tablet by mouth at bedtime.    [provider]  carbidopa-levodopa (SINEMET IR) 25-100 MG tablet TAKE 2 TABLETS BY MOUTH 3 (THREE) TIMES DAILY 06/25/18   [provider]  Cholecalciferol (VITAMIN D3) 25  MCG (1000 UT) CAPS Take 1 capsule by mouth daily.    [provider]  clonazePAM (KLONOPIN) 1 MG tablet Take 1 mg by mouth at bedtime as needed. 04/10/19   [provider]  DULoxetine (CYMBALTA) 60 MG capsule Take 1 capsule (60 mg total) by mouth daily after breakfast. 02/05/23 08/04/23  Edward Jolly, MD  furosemide (LASIX) 20 MG tablet Take 20 mg by mouth daily.    [provider]  hydrochlorothiazide (HYDRODIURIL) 25 MG tablet Take 25 mg by mouth daily. 07/03/18   [provider]  HYDROcodone-acetaminophen (NORCO) 10-325 MG tablet Take 1 tablet by mouth every 8 (eight) hours as needed. For chronic pain syndrome. Each Rx to last 30 days. 05/15/23 06/14/23  Edward Jolly, MD  HYDROcodone-acetaminophen (NORCO) 10-325 MG tablet Take 1 tablet by mouth every 8 (eight) hours as needed. For chronic pain syndrome. Each Rx to last 30 days. 06/14/23 07/14/23  Edward Jolly,  MD  HYDROcodone-acetaminophen (NORCO) 10-325 MG tablet Take 1 tablet by mouth every 8 (eight) hours as needed. For chronic pain syndrome. Each Rx to last 30 days. 07/14/23 08/13/23  Edward Jolly, MD  lamoTRIgine (LAMICTAL) 25 MG tablet Take 25 mg by mouth at bedtime. Titration schedule 01/24/23 04/13/23  [provider]  losartan (COZAAR) 100 MG tablet Take 100 mg by mouth daily. 09/23/19   [provider]  metFORMIN (GLUCOPHAGE) 500 MG tablet Take 500 mg by mouth daily after lunch.    [provider]  metoprolol succinate (TOPROL-XL) 25 MG 24 hr tablet Take 12.5 mg by mouth daily. 08/10/21 04/13/23  [provider]  nortriptyline (PAMELOR) 25 MG capsule Take 2 capsules (50 mg total) by mouth at bedtime. 02/05/23   Edward Jolly, MD  omeprazole (PRILOSEC) 40 MG capsule Take 1 capsule by mouth 2 (two) times daily. 06/16/20   [provider]  pregabalin (LYRICA) 100 MG capsule Take 1 capsule (100 mg total) by mouth 3 (three) times daily. 05/09/23 11/05/23  Edward Jolly, MD  sildenafil (VIAGRA) 100 MG tablet 1/2 to 1 tab daily as needed prior to sex 09/19/21   [provider]  tiZANidine (ZANAFLEX) 4 MG tablet Take 1 tablet (4 mg total) by mouth 3 (three) times daily. 05/20/23   Edward Jolly, MD  vitamin B-12 (CYANOCOBALAMIN) 1000 MCG tablet Take 1,000 mcg by mouth daily.    [provider]      VITAL SIGNS:  Blood pressure 114/65, pulse 94, temperature 98 F (36.7 C), temperature source Oral, resp. rate 20, height 6\' 3"  (1.905 m), weight 136 kg, SpO2 94%.  PHYSICAL EXAMINATION:  Physical Exam  GENERAL:  53 y.o.-year-old patient lying in the bed with no acute distress.  EYES: Pupils equal, round, reactive to light and accommodation. No scleral icterus. Extraocular muscles intact.  HEENT: Head atraumatic, normocephalic. Oropharynx and nasopharynx clear.  NECK:  Supple, no jugular venous distention. No thyroid enlargement, no tenderness.   LUNGS: Normal breath sounds bilaterally, no wheezing, rales,rhonchi or crepitation. No use of accessory muscles of respiration.  CARDIOVASCULAR: Regular rate and rhythm, S1, S2 normal. No murmurs, rubs, or gallops.  ABDOMEN: Soft, nondistended, nontender. Bowel sounds present. No organomegaly or mass.  EXTREMITIES: No pedal edema, cyanosis, or clubbing.  NEUROLOGIC: Cranial nerves II through XII are intact. Muscle strength 5/5 in all extremities. Sensation intact. Gait not checked.  PSYCHIATRIC: The patient is alert and oriented x 3.  Normal affect and good eye contact. SKIN: No obvious rash, lesion, or ulcer.  Musculo-skeletal: Right  hip tenderness with decreased range of motion.  Mild mid back tenderness at L4 and L5. LABORATORY PANEL:   CBC Recent Labs  Lab 05/26/23 0010  WBC 8.2  HGB 14.0  HCT 40.3  PLT 173   ------------------------------------------------------------------------------------------------------------------  Chemistries  Recent Labs  Lab 05/26/23 0010  NA 136  K 3.8  CL 100  CO2 25  GLUCOSE 104*  BUN 21*  CREATININE 0.90  CALCIUM 9.6   ------------------------------------------------------------------------------------------------------------------  Cardiac Enzymes No results for input(s): "TROPONINI" in the last 168 hours. ------------------------------------------------------------------------------------------------------------------  RADIOLOGY:  MR LUMBAR SPINE WO CONTRAST Result Date: 05/26/2023 CLINICAL DATA:  Low back pain with recent laminectomy EXAM: MRI LUMBAR SPINE WITHOUT CONTRAST TECHNIQUE: Multiplanar, multisequence MR imaging of the lumbar spine was performed. No intravenous contrast was administered. COMPARISON:  04/12/2023 and 05/23/2023 FINDINGS: Segmentation:  Standard. Alignment:  Physiologic. Vertebrae: Status post right L4-5 laminectomy. No acute fracture or discitis-osteomyelitis. Conus medullaris and cauda equina: Conus extends to  the L1 level. Conus and cauda equina appear normal. Paraspinal and other soft tissues: Negative. Disc levels: L1-L2: Normal disc space and facet joints. No spinal canal stenosis. No neural foraminal stenosis. L2-L3: Intermediate sized disc bulge. Unchanged severe spinal canal stenosis. No neural foraminal stenosis. L3-L4: Intermediate sized disc bulge. Unchanged severe spinal canal stenosis. No neural foraminal stenosis. L4-L5: Status post right laminectomy. Intermediate sized disc bulge. Severe spinal canal stenosis. Severe bilateral neural foraminal stenosis. L5-S1: Small disc bulge with superimposed central protrusion and mild facet hypertrophy. Left lateral recess narrowing without central spinal canal stenosis. Mild right and moderate left neural foraminal stenosis. Visualized sacrum: Normal. IMPRESSION: 1. Status post right L4-5 laminectomy with unchanged severe spinal canal and bilateral neural foraminal stenosis. 2. Unchanged severe spinal canal stenosis at L2-L3 and L3-L4. 3. Unchanged moderate left L5-S1 neural foraminal stenosis. Electronically Signed   By: Deatra Robinson M.D.   On: 05/26/2023 00:15      IMPRESSION AND PLAN:  Assessment and Plan: * Intractable low back pain - The patient be admitted to a medical-surgical observation bed. - Pain management will be provided. - We will continue muscle relaxants. - Neurosurgery consult will be obtained. - Dr. Katrinka Blazing was notified about the patient and is aware. - He recommended MRI of the hip.  Essential hypertension - We will continue antihypertensive therapy.  Type 2 diabetes mellitus with peripheral neuropathy (HCC) - We will place the patient on supplemental coverage with NovoLog. - We will continue Neurontin. - We will hold off metformin.  Anxiety and depression - We will continue Klonopin and Cymbalta.  Parkinsonism (HCC) - We will continue Sinemet.   DVT prophylaxis: Lovenox.  Advanced Care Planning:  Code Status: full code.   Family Communication:  The plan of care was discussed in details with the patient (and family). I answered all questions. The patient agreed to proceed with the above mentioned plan. Further management will depend upon hospital course. Disposition Plan: Back to previous home environment Consults called: Neurosurgery All the records are reviewed and case discussed with ED provider.  Status is: Observation  I certify that at the time of admission, it is my clinical judgment that the patient will require hospital care extending less than 2 midnights.                            Dispo: The patient is from: Home              Anticipated d/c is  to: Home              Patient currently is not medically stable to d/c.              Difficult to place patient: No  Hannah Beat M.D on 05/26/2023 at 4:15 AM  [facility]   From 7 PM-7 AM, contact night-coverage [email or web address]  CC: Primary care physician; Kandyce Rud, MD

## 2023-05-26 NOTE — Assessment & Plan Note (Addendum)
 Patient with recent microdiscectomy surgery with neurosurgery. Significant and worsening lower back pain, lumbar MRI with severe spinal canal and bilateral neuronal foraminal stenosis. Right hip MRI was negative for any acute significant abnormality -Neurosurgery is recommending high-dose steroid and if continue to have significant pain then they might take him back to the OR for further decompression -High-dose Decadron was added -Continue with pain management

## 2023-05-26 NOTE — Assessment & Plan Note (Signed)
-   We will continue antihypertensive therapy.

## 2023-05-26 NOTE — Progress Notes (Signed)
 PHARMACIST - PHYSICIAN COMMUNICATION  CONCERNING:  Enoxaparin (Lovenox) for DVT Prophylaxis    RECOMMENDATION: Patient was prescribed enoxaprin 40mg  q24 hours for VTE prophylaxis.   Filed Weights   05/25/23 2151  Weight: 136 kg (299 lb 13.2 oz)    Body mass index is 37.48 kg/m.  Estimated Creatinine Clearance: 141.1 mL/min (by C-G formula based on SCr of 0.9 mg/dL).   Based on [facility] policy patient is candidate for enoxaparin 0.5mg /kg TBW SQ every 24 hours based on BMI being >30.  DESCRIPTION: Pharmacy has adjusted enoxaparin dose per [facility] policy.  Patient is now receiving enoxaparin 0.5 mg/kg every 24 hours   Otelia Sergeant, PharmD, [facility] 05/26/2023 3:39 AM

## 2023-05-26 NOTE — Assessment & Plan Note (Signed)
-   We will continue Klonopin and Cymbalta  

## 2023-05-26 NOTE — Assessment & Plan Note (Addendum)
 Patient was on metformin at home. -Started on moderate SSI-might need more insulin as he is getting steroid. - We will continue Lyrica - We will hold off metformin.

## 2023-05-27 ENCOUNTER — Encounter: Payer: Medicare Other | Admitting: Neurosurgery

## 2023-05-27 DIAGNOSIS — R000 Tachycardia, unspecified: Secondary | ICD-10-CM | POA: Diagnosis not present

## 2023-05-27 DIAGNOSIS — F32A Depression, unspecified: Secondary | ICD-10-CM | POA: Diagnosis present

## 2023-05-27 DIAGNOSIS — Z825 Family history of asthma and other chronic lower respiratory diseases: Secondary | ICD-10-CM | POA: Diagnosis not present

## 2023-05-27 DIAGNOSIS — M5106 Intervertebral disc disorders with myelopathy, lumbar region: Secondary | ICD-10-CM | POA: Diagnosis present

## 2023-05-27 DIAGNOSIS — R262 Difficulty in walking, not elsewhere classified: Secondary | ICD-10-CM | POA: Diagnosis not present

## 2023-05-27 DIAGNOSIS — D72829 Elevated white blood cell count, unspecified: Secondary | ICD-10-CM | POA: Diagnosis not present

## 2023-05-27 DIAGNOSIS — M48061 Spinal stenosis, lumbar region without neurogenic claudication: Secondary | ICD-10-CM | POA: Diagnosis not present

## 2023-05-27 DIAGNOSIS — Z7984 Long term (current) use of oral hypoglycemic drugs: Secondary | ICD-10-CM | POA: Diagnosis not present

## 2023-05-27 DIAGNOSIS — G20C Parkinsonism, unspecified: Secondary | ICD-10-CM | POA: Diagnosis present

## 2023-05-27 DIAGNOSIS — M5416 Radiculopathy, lumbar region: Secondary | ICD-10-CM | POA: Diagnosis not present

## 2023-05-27 DIAGNOSIS — G40909 Epilepsy, unspecified, not intractable, without status epilepticus: Secondary | ICD-10-CM | POA: Diagnosis present

## 2023-05-27 DIAGNOSIS — M25551 Pain in right hip: Secondary | ICD-10-CM | POA: Diagnosis present

## 2023-05-27 DIAGNOSIS — Z888 Allergy status to other drugs, medicaments and biological substances status: Secondary | ICD-10-CM | POA: Diagnosis not present

## 2023-05-27 DIAGNOSIS — Z981 Arthrodesis status: Secondary | ICD-10-CM | POA: Diagnosis not present

## 2023-05-27 DIAGNOSIS — Z79899 Other long term (current) drug therapy: Secondary | ICD-10-CM | POA: Diagnosis not present

## 2023-05-27 DIAGNOSIS — I10 Essential (primary) hypertension: Secondary | ICD-10-CM | POA: Diagnosis present

## 2023-05-27 DIAGNOSIS — E669 Obesity, unspecified: Secondary | ICD-10-CM | POA: Diagnosis present

## 2023-05-27 DIAGNOSIS — E1142 Type 2 diabetes mellitus with diabetic polyneuropathy: Secondary | ICD-10-CM | POA: Diagnosis present

## 2023-05-27 DIAGNOSIS — M549 Dorsalgia, unspecified: Secondary | ICD-10-CM | POA: Diagnosis present

## 2023-05-27 DIAGNOSIS — Z833 Family history of diabetes mellitus: Secondary | ICD-10-CM | POA: Diagnosis not present

## 2023-05-27 DIAGNOSIS — Z882 Allergy status to sulfonamides status: Secondary | ICD-10-CM | POA: Diagnosis not present

## 2023-05-27 DIAGNOSIS — Z8261 Family history of arthritis: Secondary | ICD-10-CM | POA: Diagnosis not present

## 2023-05-27 DIAGNOSIS — F419 Anxiety disorder, unspecified: Secondary | ICD-10-CM | POA: Diagnosis present

## 2023-05-27 DIAGNOSIS — M48062 Spinal stenosis, lumbar region with neurogenic claudication: Secondary | ICD-10-CM | POA: Diagnosis present

## 2023-05-27 DIAGNOSIS — G894 Chronic pain syndrome: Secondary | ICD-10-CM | POA: Diagnosis present

## 2023-05-27 DIAGNOSIS — K219 Gastro-esophageal reflux disease without esophagitis: Secondary | ICD-10-CM | POA: Diagnosis present

## 2023-05-27 DIAGNOSIS — M5459 Other low back pain: Secondary | ICD-10-CM | POA: Diagnosis not present

## 2023-05-27 DIAGNOSIS — Z91030 Bee allergy status: Secondary | ICD-10-CM | POA: Diagnosis not present

## 2023-05-27 DIAGNOSIS — Z8249 Family history of ischemic heart disease and other diseases of the circulatory system: Secondary | ICD-10-CM | POA: Diagnosis not present

## 2023-05-27 LAB — GLUCOSE, CAPILLARY
Glucose-Capillary: 155 mg/dL — ABNORMAL HIGH (ref 70–99)
Glucose-Capillary: 194 mg/dL — ABNORMAL HIGH (ref 70–99)
Glucose-Capillary: 213 mg/dL — ABNORMAL HIGH (ref 70–99)
Glucose-Capillary: 268 mg/dL — ABNORMAL HIGH (ref 70–99)

## 2023-05-27 NOTE — Progress Notes (Signed)
 Progress Note   Patient: Thomas Cole EAV:[id number] DOB: [birth year] DOA: 05/25/2023     0 DOS: the patient was seen and examined on 05/27/2023   Brief hospital course: Taken from H&P.  Thomas Cole is a 53 y.o. male with medical history significant for anxiety, depression, asthma, type 2 diabetes mellitus, GERD, Parkinsonism, hypertension, migraine and seizure disorder, who presented to the emergency room with acute onset of intractable low back pain.  He admits to right hip pain with decreased range of motion due to pain.  The patient underwent L4-L5 microdiscectomy on 04/14/2023.   On presentation stable vital and labs. Lumbar spine MRI without contrast revealed the following: 1. Status post right L4-5 laminectomy with unchanged severe spinal canal and bilateral neural foraminal stenosis. 2. Unchanged severe spinal canal stenosis at L2-L3 and L3-L4. 3. Unchanged moderate left L5-S1 neural foraminal stenosis.  Patient was admitted for pain control and neurosurgery was consulted.  2/23: Vital stable except borderline tachycardia,MRI of right hip was negative for any acute abnormality, did show some right sided posterior paraspinal intramuscular edema and degenerative changes.  Neurosurgery is recommending high-dose steroid and if continue to have significant pain then they might take him back to the OR for further decompression.  2/24:Vital Stable, pain little better with steroid.   Assessment and Plan: * Intractable low back pain Patient with recent microdiscectomy surgery with neurosurgery. Significant and worsening lower back pain, lumbar MRI with severe spinal canal and bilateral neuronal foraminal stenosis. Right hip MRI was negative for any acute significant abnormality -Neurosurgery is recommending high-dose steroid and if continue to have significant pain then they might take him back to the OR for further decompression -High-dose Decadron was added -Continue with pain  management  Essential hypertension - We will continue antihypertensive therapy.  Type 2 diabetes mellitus with peripheral neuropathy (HCC) Patient was on metformin at home. -Started on moderate SSI-might need more insulin as he is getting steroid. - We will continue Lyrica - We will hold off metformin.  Anxiety and depression - We will continue Klonopin and Cymbalta.  Parkinsonism (HCC) - We will continue Sinemet.   Subjective:  Patient was having 7/10 pain with ambulation, stating it's better then yesterday.  Physical Exam: Vitals:   05/27/23 0022 05/27/23 0218 05/27/23 0739 05/27/23 1528  BP: 100/70 (!) 80/45 111/67 (!) 140/76  Pulse: (!) 110 98 (!) 104 (!) 103  Resp:   16 16  Temp:  98 F (36.7 C) 97.7 F (36.5 C) 97.8 F (36.6 C)  TempSrc:  Oral Oral   SpO2: 95% 92% 93% 99%  Weight:      Height:       General. Obese gentleman, In no acute distress. Pulmonary.  Lungs clear bilaterally, normal respiratory effort. CV.  Regular rate and rhythm, no JVD, rub or murmur. Abdomen.  Soft, nontender, nondistended, BS positive. CNS.  Alert and oriented .  No focal neurologic deficit. Extremities.  No edema, no cyanosis, pulses intact and symmetrical. Psychiatry.  Judgment and insight appears normal.   Data Reviewed: Prior data reviewed  Family Communication: Discussed with patient  Disposition: Status is: In patient. The patient will require care spanning > 2 midnights and should be moved to inpatient because: Severity of illness  Planned Discharge Destination: Home   Time spent: 45 minutes  This record has been created using Conservation officer, historic buildings. Errors have been sought and corrected,but may not always be located. Such creation errors do not reflect on the standard  of care.   Author: Arnetha Courser, MD 05/27/2023 5:19 PM  For on call review [email or web address].

## 2023-05-27 NOTE — Care Management Obs Status (Signed)
 MEDICARE OBSERVATION STATUS NOTIFICATION   Patient Details  Name: Thomas Cole MRN: [id number] Date of Birth: Mar 13, 1971   Medicare Observation Status Notification Given:  Orland Dec, CMA 05/27/2023, 10:46 AM

## 2023-05-27 NOTE — Assessment & Plan Note (Signed)
-   We will continue antihypertensive therapy.

## 2023-05-27 NOTE — Assessment & Plan Note (Signed)
 lumbar MRI with severe spinal canal and bilateral neuronal foraminal stenosis.  S/p surgical decompression on 2/25 Right hip MRI was negative for any acute significant abnormality -Neurosurgery is on board -likely will stay 2-3 more days -Continue with pain management

## 2023-05-27 NOTE — Plan of Care (Signed)

## 2023-05-27 NOTE — Assessment & Plan Note (Signed)
 Patient was on metformin at home. -Started on moderate SSI-might need more insulin as he is getting steroid. - We will continue Lyrica - We will hold off metformin.

## 2023-05-28 ENCOUNTER — Inpatient Hospital Stay: Payer: Medicare Other | Admitting: General Practice

## 2023-05-28 ENCOUNTER — Other Ambulatory Visit: Payer: Self-pay

## 2023-05-28 ENCOUNTER — Encounter
Admission: EM | Disposition: A | Discharge status: Home / Self Care | Payer: Self-pay | Source: Home / Self Care | Attending: Family Medicine

## 2023-05-28 ENCOUNTER — Inpatient Hospital Stay: Payer: Medicare Other

## 2023-05-28 DIAGNOSIS — R262 Difficulty in walking, not elsewhere classified: Secondary | ICD-10-CM | POA: Diagnosis not present

## 2023-05-28 DIAGNOSIS — I10 Essential (primary) hypertension: Secondary | ICD-10-CM | POA: Diagnosis not present

## 2023-05-28 DIAGNOSIS — M549 Dorsalgia, unspecified: Secondary | ICD-10-CM | POA: Diagnosis not present

## 2023-05-28 DIAGNOSIS — M5459 Other low back pain: Secondary | ICD-10-CM | POA: Diagnosis not present

## 2023-05-28 DIAGNOSIS — M5416 Radiculopathy, lumbar region: Secondary | ICD-10-CM | POA: Diagnosis not present

## 2023-05-28 DIAGNOSIS — E1142 Type 2 diabetes mellitus with diabetic polyneuropathy: Secondary | ICD-10-CM | POA: Diagnosis not present

## 2023-05-28 DIAGNOSIS — M48062 Spinal stenosis, lumbar region with neurogenic claudication: Secondary | ICD-10-CM | POA: Diagnosis not present

## 2023-05-28 HISTORY — PX: DECOMPRESSIVE LUMBAR LAMINECTOMY LEVEL 2: SHX5792

## 2023-05-28 LAB — GLUCOSE, CAPILLARY
Glucose-Capillary: 146 mg/dL — ABNORMAL HIGH (ref 70–99)
Glucose-Capillary: 130 mg/dL — ABNORMAL HIGH (ref 70–99)
Glucose-Capillary: 93 mg/dL (ref 70–99)
Glucose-Capillary: 150 mg/dL — ABNORMAL HIGH (ref 70–99)
Glucose-Capillary: 149 mg/dL — ABNORMAL HIGH (ref 70–99)

## 2023-05-28 SURGERY — DECOMPRESSIVE LUMBAR LAMINECTOMY LEVEL 2
Anesthesia: General | Site: Spine Lumbar | Laterality: Bilateral

## 2023-05-28 MED ORDER — DEXMEDETOMIDINE HCL IN NACL 200 MCG/50ML IV SOLN
INTRAVENOUS | Status: DC | PRN
  Administered 2023-05-28: 8 ug via INTRAVENOUS

## 2023-05-28 MED ORDER — DROPERIDOL 2.5 MG/ML IJ SOLN: 0.6250 mg | Freq: Once | INTRAMUSCULAR | Status: DC | PRN

## 2023-05-28 MED ORDER — SUCCINYLCHOLINE CHLORIDE 200 MG/10ML IV SOSY
PREFILLED_SYRINGE | INTRAVENOUS | Status: DC | PRN
  Administered 2023-05-28: 120 mg via INTRAVENOUS

## 2023-05-28 MED ORDER — DEXTROSE 5 % IV SOLN
INTRAVENOUS | Status: DC | PRN
  Administered 2023-05-28: 3 g via INTRAVENOUS

## 2023-05-28 MED ORDER — ONDANSETRON HCL 4 MG/2ML IJ SOLN
INTRAMUSCULAR | Status: AC
  Filled 2023-05-28: qty 2

## 2023-05-28 MED ORDER — FENTANYL CITRATE (PF) 100 MCG/2ML IJ SOLN
INTRAMUSCULAR | Status: AC
  Filled 2023-05-28: qty 2

## 2023-05-28 MED ORDER — ACETAMINOPHEN 10 MG/ML IV SOLN
INTRAVENOUS | Status: AC
  Filled 2023-05-28: qty 100

## 2023-05-28 MED ORDER — ONDANSETRON HCL 4 MG/2ML IJ SOLN
INTRAMUSCULAR | Status: DC | PRN
  Administered 2023-05-28: 4 mg via INTRAVENOUS

## 2023-05-28 MED ORDER — SUGAMMADEX SODIUM 200 MG/2ML IV SOLN
INTRAVENOUS | Status: DC | PRN
  Administered 2023-05-28: 300 mg via INTRAVENOUS

## 2023-05-28 MED ORDER — OXYCODONE HCL 5 MG PO TABS
5.0000 mg | ORAL_TABLET | Freq: Once | ORAL | Status: AC | PRN
  Administered 2023-05-28: 5 mg via ORAL

## 2023-05-28 MED ORDER — ACETAMINOPHEN 10 MG/ML IV SOLN
INTRAVENOUS | Status: DC | PRN
  Administered 2023-05-28: 1000 mg via INTRAVENOUS

## 2023-05-28 MED ORDER — 0.9 % SODIUM CHLORIDE (POUR BTL) OPTIME
TOPICAL | Status: DC | PRN
Start: 2023-05-28 — End: 2023-05-28
  Administered 2023-05-28: 500 mL

## 2023-05-28 MED ORDER — PHENYLEPHRINE 80 MCG/ML (10ML) SYRINGE FOR IV PUSH (FOR BLOOD PRESSURE SUPPORT)
PREFILLED_SYRINGE | INTRAVENOUS | Status: DC | PRN
  Administered 2023-05-28 (×2): 160 ug via INTRAVENOUS
  Administered 2023-05-28: 80 ug via INTRAVENOUS
  Administered 2023-05-28: 160 ug via INTRAVENOUS
  Administered 2023-05-28: 80 ug via INTRAVENOUS

## 2023-05-28 MED ORDER — SODIUM CHLORIDE (PF) 0.9 % IJ SOLN
INTRAMUSCULAR | Status: DC | PRN
  Administered 2023-05-28: 60 mL via INTRAMUSCULAR

## 2023-05-28 MED ORDER — OXYCODONE HCL 5 MG/5ML PO SOLN: 5.0000 mg | Freq: Once | ORAL | Status: AC | PRN

## 2023-05-28 MED ORDER — METHYLPREDNISOLONE ACETATE 40 MG/ML IJ SUSP
INTRAMUSCULAR | Status: AC
  Filled 2023-05-28: qty 1

## 2023-05-28 MED ORDER — GLYCOPYRROLATE 0.2 MG/ML IJ SOLN
INTRAMUSCULAR | Status: DC | PRN
  Administered 2023-05-28: .2 mg via INTRAVENOUS

## 2023-05-28 MED ORDER — SUGAMMADEX SODIUM 200 MG/2ML IV SOLN
INTRAVENOUS | Status: AC
  Filled 2023-05-28: qty 4

## 2023-05-28 MED ORDER — METHYLPREDNISOLONE ACETATE 40 MG/ML IJ SUSP
INTRAMUSCULAR | Status: DC | PRN
  Administered 2023-05-28: 40 mg

## 2023-05-28 MED ORDER — ACETAMINOPHEN 10 MG/ML IV SOLN: 1000.0000 mg | Freq: Once | INTRAVENOUS | Status: DC | PRN

## 2023-05-28 MED ORDER — DEXAMETHASONE SODIUM PHOSPHATE 10 MG/ML IJ SOLN
INTRAMUSCULAR | Status: DC | PRN
  Administered 2023-05-28: 10 mg via INTRAVENOUS

## 2023-05-28 MED ORDER — PHENYLEPHRINE HCL-NACL 20-0.9 MG/250ML-% IV SOLN
INTRAVENOUS | Status: AC
Start: 2023-05-28 — End: ?
  Filled 2023-05-28: qty 250

## 2023-05-28 MED ORDER — FENTANYL CITRATE (PF) 100 MCG/2ML IJ SOLN
25.0000 ug | INTRAMUSCULAR | Status: DC | PRN
  Administered 2023-05-28 (×4): 25 ug via INTRAVENOUS

## 2023-05-28 MED ORDER — HYDROMORPHONE HCL 1 MG/ML IJ SOLN
INTRAMUSCULAR | Status: AC
  Filled 2023-05-28: qty 1

## 2023-05-28 MED ORDER — BUPIVACAINE HCL (PF) 0.5 % IJ SOLN
INTRAMUSCULAR | Status: AC
  Filled 2023-05-28: qty 30

## 2023-05-28 MED ORDER — CEFAZOLIN SODIUM-DEXTROSE 2-4 GM/100ML-% IV SOLN
INTRAVENOUS | Status: AC
  Filled 2023-05-28: qty 100

## 2023-05-28 MED ORDER — BUPIVACAINE-EPINEPHRINE (PF) 0.5% -1:200000 IJ SOLN
INTRAMUSCULAR | Status: DC | PRN
  Administered 2023-05-28: 10 mL

## 2023-05-28 MED ORDER — HYDROMORPHONE HCL 1 MG/ML IJ SOLN
INTRAMUSCULAR | Status: DC | PRN
  Administered 2023-05-28: 1 mg via INTRAVENOUS

## 2023-05-28 MED ORDER — BUPIVACAINE LIPOSOME 1.3 % IJ SUSP
INTRAMUSCULAR | Status: AC
  Filled 2023-05-28: qty 20

## 2023-05-28 MED ORDER — PROPOFOL 10 MG/ML IV BOLUS
INTRAVENOUS | Status: DC | PRN
  Administered 2023-05-28: 200 mg via INTRAVENOUS

## 2023-05-28 MED ORDER — FENTANYL CITRATE (PF) 100 MCG/2ML IJ SOLN
INTRAMUSCULAR | Status: DC | PRN
  Administered 2023-05-28 (×2): 50 ug via INTRAVENOUS

## 2023-05-28 MED ORDER — OXYCODONE HCL 5 MG PO TABS
ORAL_TABLET | ORAL | Status: AC
  Filled 2023-05-28: qty 1

## 2023-05-28 MED ORDER — PHENYLEPHRINE HCL-NACL 20-0.9 MG/250ML-% IV SOLN
INTRAVENOUS | Status: DC | PRN
  Administered 2023-05-28: 30 ug/min via INTRAVENOUS

## 2023-05-28 MED ORDER — CEFAZOLIN SODIUM-DEXTROSE 1-4 GM/50ML-% IV SOLN
INTRAVENOUS | Status: AC
  Filled 2023-05-28: qty 50

## 2023-05-28 MED ORDER — LIDOCAINE HCL (CARDIAC) PF 100 MG/5ML IV SOSY
PREFILLED_SYRINGE | INTRAVENOUS | Status: DC | PRN
  Administered 2023-05-28: 100 mg via INTRAVENOUS

## 2023-05-28 MED ORDER — SURGIFLO WITH THROMBIN (HEMOSTATIC MATRIX KIT) OPTIME
TOPICAL | Status: DC | PRN
Start: 2023-05-28 — End: 2023-05-28
  Administered 2023-05-28 (×2): 1 via TOPICAL

## 2023-05-28 MED ORDER — ROCURONIUM BROMIDE 100 MG/10ML IV SOLN
INTRAVENOUS | Status: DC | PRN
  Administered 2023-05-28: 50 mg via INTRAVENOUS

## 2023-05-28 MED ORDER — MIDAZOLAM HCL 2 MG/2ML IJ SOLN
INTRAMUSCULAR | Status: DC | PRN
  Administered 2023-05-28: 2 mg via INTRAVENOUS

## 2023-05-28 MED ORDER — BUPIVACAINE-EPINEPHRINE (PF) 0.5% -1:200000 IJ SOLN
INTRAMUSCULAR | Status: AC
Start: 2023-05-28 — End: ?
  Filled 2023-05-28: qty 10

## 2023-05-28 MED ORDER — SODIUM CHLORIDE (PF) 0.9 % IJ SOLN
INTRAMUSCULAR | Status: AC
  Filled 2023-05-28: qty 20

## 2023-05-28 MED ORDER — MIDAZOLAM HCL 2 MG/2ML IJ SOLN
INTRAMUSCULAR | Status: AC
  Filled 2023-05-28: qty 2

## 2023-05-28 MED ORDER — LACTATED RINGERS IV SOLN: INTRAVENOUS | Status: DC | PRN

## 2023-05-28 SURGICAL SUPPLY — 35 items
BASIN KIT SINGLE STR (MISCELLANEOUS) ×1 IMPLANT
BRUSH SCRUB EZ 4% CHG (MISCELLANEOUS) ×1 IMPLANT
BUR NEURO DRILL SOFT 3.0X3.8M (BURR) ×1 IMPLANT
DERMABOND ADVANCED .7 DNX12 (GAUZE/BANDAGES/DRESSINGS) ×1 IMPLANT
DRAPE C-ARM XRAY 36X54 (DRAPES) ×2 IMPLANT
DRAPE LAPAROTOMY 100X77 ABD (DRAPES) ×1 IMPLANT
DRAPE MICROSCOPE SPINE 48X150 (DRAPES) ×1 IMPLANT
DRSG OPSITE POSTOP 3X4 (GAUZE/BANDAGES/DRESSINGS) ×1 IMPLANT
DRSG OPSITE POSTOP 4X8 (GAUZE/BANDAGES/DRESSINGS) IMPLANT
DRSG TEGADERM 4X4.75 (GAUZE/BANDAGES/DRESSINGS) ×1 IMPLANT
ELECT EZSTD 165MM 6.5IN (MISCELLANEOUS) IMPLANT
ELECT REM PT RETURN 9FT ADLT (ELECTROSURGICAL) ×1 IMPLANT
ELECTRODE EZSTD 165MM 6.5IN (MISCELLANEOUS) ×1 IMPLANT
ELECTRODE REM PT RTRN 9FT ADLT (ELECTROSURGICAL) ×1 IMPLANT
EVACUATOR 1/8 PVC DRAIN (DRAIN) IMPLANT
GLOVE BIOGEL PI IND STRL 8 (GLOVE) ×2 IMPLANT
GLOVE SURG SYN 7.5 E (GLOVE) ×1 IMPLANT
GLOVE SURG SYN 7.5 PF PI (GLOVE) ×1 IMPLANT
GOWN SRG XL LVL 3 NONREINFORCE (GOWNS) ×1 IMPLANT
GOWN STRL REUS W/ TWL LRG LVL3 (GOWN DISPOSABLE) ×1 IMPLANT
KIT WILSON FRAME (KITS) ×1 IMPLANT
KNIFE BAYONET SHORT DISCETOMY (MISCELLANEOUS) IMPLANT
NDL SAFETY ECLIPSE 18X1.5 (NEEDLE) ×1 IMPLANT
NS IRRIG 500ML POUR BTL (IV SOLUTION) ×1 IMPLANT
PACK LAMINECTOMY ARMC (PACKS) ×1 IMPLANT
PAD ARMBOARD 7.5X6 YLW CONV (MISCELLANEOUS) ×2 IMPLANT
STAPLER SKIN PROX 35W (STAPLE) IMPLANT
SURGIFLO W/THROMBIN 8M KIT (HEMOSTASIS) ×1 IMPLANT
SUT STRATA 3-0 15 PS-2 (SUTURE) ×1 IMPLANT
SUT VIC AB 0 CT1 27XCR 8 STRN (SUTURE) ×1 IMPLANT
SUT VIC AB 2-0 CT1 18 (SUTURE) ×1 IMPLANT
SYR 30ML LL (SYRINGE) ×2 IMPLANT
SYR 3ML LL SCALE MARK (SYRINGE) ×1 IMPLANT
TRAP FLUID SMOKE EVACUATOR (MISCELLANEOUS) ×1 IMPLANT
WATER STERILE IRR 1000ML POUR (IV SOLUTION) IMPLANT

## 2023-05-28 NOTE — Anesthesia Procedure Notes (Signed)
 Procedure Name: Intubation Date/Time: 05/28/2023 5:13 PM  Performed by: Mohammed Kindle, CRNAPre-anesthesia Checklist: Patient identified, Emergency Drugs available, Suction available and Patient being monitored Patient Re-evaluated:Patient Re-evaluated prior to induction Oxygen Delivery Method: Circle system utilized Preoxygenation: Pre-oxygenation with 100% oxygen Induction Type: IV induction Ventilation: Mask ventilation without difficulty Laryngoscope Size: McGrath and 4 Grade View: Grade I Tube type: Oral Tube size: 7.5 mm Number of attempts: 1 Airway Equipment and Method: Stylet and Oral airway Placement Confirmation: ETT inserted through vocal cords under direct vision, positive ETCO2 and breath sounds checked- equal and bilateral Secured at: 22 cm Tube secured with: Tape Dental Injury: Teeth and Oropharynx as per pre-operative assessment

## 2023-05-28 NOTE — Progress Notes (Signed)
 Patient taken to OR.

## 2023-05-28 NOTE — Transfer of Care (Signed)
 Immediate Anesthesia Transfer of Care Note  Patient: Thomas Cole  Procedure(s) Performed: DECOMPRESSIVE LUMBAR LAMINECTOMY L2-5 (Bilateral: Spine Lumbar)  Patient Location: PACU  Anesthesia Type:General  Level of Consciousness: awake and alert   Airway & Oxygen Therapy: Patient Spontanous Breathing and Patient connected to face mask oxygen  Post-op Assessment: Report given to RN, Post -op Vital signs reviewed and stable, and Patient moving all extremities  Post vital signs: Reviewed  Last Vitals:  Vitals Value Taken Time  BP 124/81 05/28/23 2004  Temp    Pulse 105 05/28/23 2006  Resp 9 05/28/23 2006  SpO2 99   Vitals shown include unfiled device data.  Last Pain:  Vitals:   05/28/23 1543  TempSrc: Oral  PainSc:       Patients Stated Pain Goal: 0 (05/26/23 2000)  Complications: No notable events documented.

## 2023-05-28 NOTE — Interval H&P Note (Signed)
 History and Physical Interval Note:  05/28/2023 4:17 PM  Thomas Cole  has presented today for surgery, with the diagnosis of Lumbar Stenosis, Claudication.  The various methods of treatment have been discussed with the patient and family. After consideration of risks, benefits and other options for treatment, the patient has consented to  Procedure(s): DECOMPRESSIVE LUMBAR LAMINECTOMY L2-5 (Bilateral) as a surgical intervention.  The patient's history has been reviewed, patient examined, no change in status, stable for surgery.  I have reviewed the patient's chart and labs.  Questions were answered to the patient's satisfaction.    Heart and Lungs Clear   Lovenia Kim

## 2023-05-28 NOTE — Op Note (Addendum)
 Indications: 53 year old man with complicated past medical history including multiple lumbar spine surgeries.  He previously had a spinal injury at L5-S1 causing an emergent decompression.  More recently with me he had a acute disc herniation with significant right lower extremity weakness and was taken to the operating room emergently for decompression and discectomy.  He was recovering well from that and is having improvement in his strength.  Unfortunately he had a a severe exacerbation of back pain and bilateral lower extremity pain in the L2-3 dermatomes.  This was causing severe pain in his bilateral lower extremities.  We attempted to treat this conservatively with steroid management and time, however unfortunately his exacerbation got so severe he needed to be hospitalized as he was having difficulty with ambulating.  On his motor examination he had at least 4 out of 5 strength, however he was unable to safely ambulate given the radiating pain weakness and sensory changes in his lower extremities.  Because of this he underwent an MRI which demonstrated severe stenosis from L2-L5, because of his symptoms and inability to safely ambulate we took him to the OR for L2-L5 laminectomy lateral recess decompression and medial facetectomy.    Findings: Severe facet and ligamentum overgrowth causing severe spinal stenosis.  Well decompressed at the end of the procedure from the inferior pedicle of L2 to the mid pedicle of L4-5  Preoperative Diagnosis:  Spinal stenosis causing lumbar radiculopathy Stenosis causing neurogenic claudication Progressive functional decline with inability to safely ambulate Intractable pain in the bilateral lower extremity secondary to lumbar radiculopathy  Postoperative Diagnosis: same   Procedure: Lumbar laminectomy medial facetectomy lateral recess decompression from L2-L5  EBL: 400 ml IVF: see anesthesia record Drains: Hemovac drain placed a subfascial Disposition:  Extubated and Stable to PACU Complications: none  No foley catheter was placed.   Preoperative Note:  Risks of surgery discussed include: infection, bleeding, stroke, coma, death, paralysis, CSF leak, nerve/spinal cord injury, numbness, tingling, weakness, complex regional pain syndrome, recurrent stenosis and/or disc herniation, vascular injury, development of instability, neck/back pain, need for further surgery, persistent symptoms, development of deformity, and the risks of anesthesia. They understood these risks and have agreed to proceed.  Operative Note:    The patient was then brought from the preoperative center with intravenous access established.  The patient underwent general anesthesia and endotracheal tube intubation, then was rotated on the Wilson frame where all pressure points were appropriately padded.  An incision was marked with flouroscopy. The skin was then thoroughly cleansed.  Perioperative antibiotic prophylaxis was administered.  Sterile prep and drapes were then applied and a timeout was then observed.  Comprehensive timeout was performed verifying patient's name, MRN, planned procedure and images.   We planned a midline incision from his previous microdiscectomy cranially to the spinous process of L2.  He was infiltrated with local anesthetic.  Skin and subcutaneous tissues were opened sharply.  Bovie cautery was utilized to dissect her way down to the lumbar fascia.  Once the lumbar fascia was identified we performed a subperiosteal dissection from L5-L2 with care taken to avoid the hemilaminotomy site at L4-5 on the right.  We continued to dissect this cranially until we had good exposure of the posterior elements.  Once this was done we are able to place a self-retaining retractor system.  We verified her levels again with fluoroscopy and also with the previous laminotomy site.  We then remove the posterior elements including the spinous process and part of the lamina  at each of these levels with the Leksell rongeur.  Once a Leksell rongeur had adequately resected significant amount of the spinous processes as well as some of the lamina we brought in the high-speed drill.  Starting from caudally at the inferior aspect of L3 we performed a laminectomy making sure this was wide enough to encapsulate the lateral recesses at each level.  The bone was removed exposing the ligamentum.  Once the ligamentum was found to no longer attached to the underside of the lamina we shelled this bone out with care taken to preserve thin layer to be removed later.  We repeated this process at L2-3 and L4-5, at this time the high-speed drill was placed to the side and we utilized upgoing curettes to dissect the ligamentum from the lateral recesses and underside of the lamina.  We did that at each level.  At each level his ligamentum was incredibly hypertrophied causing severe stenosis and compression of the neural elements.  We carefully dissected this out and were able to remove the ligamentum flavum at each level.  We then performed a medial facetectomy and were able to decompress the lateral recess at the L4-5, L3-4, and L2-3 levels.  We continue to follow the lateral recesses at each level to ensure that the traversing nerve roots were well decompressed.  We paid careful attention to the right side where he is having most of his symptoms and ensure that the nerve roots were well decompressed on their exits.  Once this was done we verified our levels with fluoroscopy.  We obtained meticulous hemostasis.  We irrigated copiously.  The wound was infiltrated with long-acting lidocaine.  A subfascial drain was placed.  The wound was closed in multiple layers.  The superficial layer was staples.  The sterile dressing was applied.Patient was then rotated back to the preoperative bed awakened from anesthesia and taken to recovery all counts are correct in this case.  I from this procedure with the  assistance of [place], [place].  She helped with tissue retraction, exposure, protection of the neural elements, protection of the dura, and with hemostasis and suction.  Lovenia Kim, MD

## 2023-05-28 NOTE — TOC Progression Note (Signed)
 Transition of Care [facility]) - Progression Note    Patient Details  Name: Thomas Cole MRN: [id number] Date of Birth: [birth year]  [facility]) CM/SW Contact  Marlowe Sax, RN Phone Number: 05/28/2023, 2:35 PM  Clinical Narrative:    TOC continues to follow the patient and will assist in DC planning, Neurosurgery is recommending high-dose steroid and if continue to have significant pain then they might take him back to the OR for further decompression  Dispositions pending further surgery         Expected Discharge Plan and Services                                               Social Determinants of Health (SDOH) Interventions SDOH Screenings   Food Insecurity: No Food Insecurity (05/26/2023)  Housing: Low Risk  (05/26/2023)  Transportation Needs: No Transportation Needs (05/26/2023)  Utilities: Not At Risk (05/26/2023)  Depression (PHQ2-9): Low Risk  (05/09/2023)  Financial Resource Strain: Medium Risk (01/10/2023)   Received from [facility]  Social Connections: Unknown (08/13/2021)   Received from [facility], [facility]  Tobacco Use: Low Risk  (05/25/2023)    Readmission Risk Interventions     No data to display

## 2023-05-28 NOTE — Progress Notes (Signed)
 Progress Note   Patient: Thomas Cole ZOX:[id number] DOB: 1970/06/03 DOA: 05/25/2023     1 DOS: the patient was seen and examined on 05/28/2023   Brief hospital course: Taken from H&P.  Thomas Cole is a 53 y.o. male with medical history significant for anxiety, depression, asthma, type 2 diabetes mellitus, GERD, Parkinsonism, hypertension, migraine and seizure disorder, who presented to the emergency room with acute onset of intractable low back pain.  He admits to right hip pain with decreased range of motion due to pain.  The patient underwent L4-L5 microdiscectomy on 04/14/2023.   On presentation stable vital and labs. Lumbar spine MRI without contrast revealed the following: 1. Status post right L4-5 laminectomy with unchanged severe spinal canal and bilateral neural foraminal stenosis. 2. Unchanged severe spinal canal stenosis at L2-L3 and L3-L4. 3. Unchanged moderate left L5-S1 neural foraminal stenosis.  Patient was admitted for pain control and neurosurgery was consulted.  2/23: Vital stable except borderline tachycardia,MRI of right hip was negative for any acute abnormality, did show some right sided posterior paraspinal intramuscular edema and degenerative changes.  Neurosurgery is recommending high-dose steroid and if continue to have significant pain then they might take him back to the OR for further decompression.  2/24:Vital Stable, pain little better with steroid.  2/25: Vital stable, going for lumbar laminectomy from L2-L5 to decompress the stenosis today.   Assessment and Plan: * Intractable low back pain Patient with recent microdiscectomy surgery with neurosurgery. Significant and worsening lower back pain, lumbar MRI with severe spinal canal and bilateral neuronal foraminal stenosis. Right hip MRI was negative for any acute significant abnormality -Neurosurgery is on board and he is going for further decompression later today. -Started on Decadron -Continue  with pain management  Essential hypertension - We will continue antihypertensive therapy.  Type 2 diabetes mellitus with peripheral neuropathy (HCC) Patient was on metformin at home. -Started on moderate SSI-might need more insulin as he is getting steroid. - We will continue Lyrica - We will hold off metformin.  Anxiety and depression - We will continue Klonopin and Cymbalta.  Parkinsonism (HCC) - We will continue Sinemet.   Subjective: Patient still having significant pain with ambulation.  Awaiting surgery later today.  Physical Exam: Vitals:   05/27/23 0739 05/27/23 1528 05/27/23 2343 05/28/23 0734  BP: 111/67 (!) 140/76 138/83 139/84  Pulse: (!) 104 (!) 103 96 90  Resp: 16 16 16 17   Temp: 97.7 F (36.5 C) 97.8 F (36.6 C) 97.9 F (36.6 C) 97.7 F (36.5 C)  TempSrc: Oral   Oral  SpO2: 93% 99% 96% 94%  Weight:      Height:       General.  Obese gentleman, in no acute distress. Pulmonary.  Lungs clear bilaterally, normal respiratory effort. CV.  Regular rate and rhythm, no JVD, rub or murmur. Abdomen.  Soft, nontender, nondistended, BS positive. CNS.  Alert and oriented .  No focal neurologic deficit. Extremities.  No edema, no cyanosis, pulses intact and symmetrical. Psychiatry.  Judgment and insight appears normal.   Data Reviewed: Prior data reviewed  Family Communication: Discussed with patient  Disposition: Status is: In patient. The patient will require care spanning > 2 midnights and should be moved to inpatient because: Severity of illness  Planned Discharge Destination: Home   Time spent: 44 minutes  This record has been created using Conservation officer, historic buildings. Errors have been sought and corrected,but may not always be located. Such creation errors do not  reflect on the standard of care.   Author: Arnetha Courser, MD 05/28/2023 3:29 PM  For on call review [email or web address].

## 2023-05-28 NOTE — Plan of Care (Signed)

## 2023-05-28 NOTE — Anesthesia Preprocedure Evaluation (Signed)
 Anesthesia Evaluation  Patient identified by MRN, date of birth, ID band Patient awake    Reviewed: Allergy & Precautions, H&P , NPO status , Patient's Chart, lab work & pertinent test results, reviewed documented beta blocker date and time   History of Anesthesia Complications (+) history of anesthetic complications  Airway Mallampati: III  TM Distance: >3 FB Neck ROM: limited    Dental  (+) Teeth Intact   Pulmonary shortness of breath, asthma , sleep apnea and Continuous Positive Airway Pressure Ventilation    Pulmonary exam normal        Cardiovascular Exercise Tolerance: Poor hypertension, On Medications negative cardio ROS Normal cardiovascular exam Rhythm:regular Rate:Normal     Neuro/Psych  Headaches, Seizures -,  PSYCHIATRIC DISORDERS Anxiety Depression     Neuromuscular disease    GI/Hepatic Neg liver ROS,GERD  Medicated,,  Endo/Other  negative endocrine ROSdiabetes, Well Controlled    Renal/GU negative Renal ROS  negative genitourinary   Musculoskeletal   Abdominal   Peds  Hematology negative hematology ROS (+)   Anesthesia Other Findings Past Medical History: No date: Anxiety No date: Asthma No date: Back problem 11/30/2016: Benign essential hypertension 09/17/2012: Cervical spondylosis without myelopathy No date: Complication of anesthesia     Comment:  occ takes longer to wake No date: Depression 02/2020: Diabetes mellitus without complication (HCC)     Comment:  Type 2 No date: GERD (gastroesophageal reflux disease) No date: Hypertension 03/19/2013: Labile hypertension     Comment:  No current neuro changes, headache much improved. No               clear sign of intracranial bleed. NO indication for head               CT>  BP much better now... Fluctuates a lot.  Will eval               for pheochromocytoma. As recommended at last cardiology               OV.. Will prescribe hydralazine to use  as needed for BP               fluctuations.  Close follow up with PCP.  06/23/2015: Major depressive disorder, recurrent episode, severe (HCC) No date: Migraine headache 11/11/2013: Migraines 04/09/2011: S/P appendectomy 11/30/2016: Seizure (HCC) No date: Seizures (HCC)     Comment:  last 87 No date: Shortness of breath 08/06/2014: SOB (shortness of breath)     Comment:  - [facility] eval with cpst 02/20/2008 exercise testing               demonstrateed normal functional capabilities and a normal              cardiopulmonary response to exercise. - 09/16/2014  Walked              RA x 3 laps @ 185 ft each stopped due to end of study/ nl              pace/ no desat  - spirometry 09/16/2014 > no obst/ min               restriction    Past Surgical History: 09/15/2012: ANTERIOR CERVICAL CORPECTOMY; N/A     Comment:  Procedure: Cervical Three-Four,Cervical Six-Seven               Anterior cervical decompression/diskectomy fusion with  Cervical Four Corpectomy;  Surgeon: Barnett Abu, MD;                Location: [facility];  Service: Neurosurgery;                Laterality: N/A;  Cervical Three-Four,Cervical Six-Seven               Anterior cervical decompression/diskectomy fusion with               Cervical four Corpectomy 12: APPENDECTOMY No date: CERVICAL DISC SURGERY 03/29/2017: CHOLECYSTECTOMY; N/A     Comment:  Procedure: LAPAROSCOPIC CHOLECYSTECTOMY;  Surgeon:               Lattie Haw, MD;  Location: [facility] ORS;  Service:               General;  Laterality: N/A; 10/23/2019: COLONOSCOPY WITH PROPOFOL; N/A     Comment:  Procedure: COLONOSCOPY WITH PROPOFOL;  Surgeon: Earline Mayotte, MD;  Location: [facility] ENDOSCOPY;  Service:               Endoscopy;  Laterality: N/A; 10/23/2019: ESOPHAGOGASTRODUODENOSCOPY (EGD) WITH PROPOFOL; N/A     Comment:  Procedure: ESOPHAGOGASTRODUODENOSCOPY (EGD) WITH               PROPOFOL;  Surgeon: Earline Mayotte, MD;  Location:                [facility] ENDOSCOPY;  Service: Endoscopy;  Laterality: N/A; No date: KNEE SURGERY     Comment:  bilateral 04/14/2023: LUMBAR LAMINECTOMY/DECOMPRESSION MICRODISCECTOMY; Right     Comment:  Procedure: L4-5 Microdiscectomy;  Surgeon: Lovenia Kim, MD;  Location: [facility] ORS;  Service:               Neurosurgery;  Laterality: Right; No date: POSTERIOR FUSION CERVICAL SPINE BMI    Body Mass Index: 37.48 kg/m     Reproductive/Obstetrics negative OB ROS                             Anesthesia Physical Anesthesia Plan  ASA: 3  Anesthesia Plan: General ETT   Post-op Pain Management:    Induction:   PONV Risk Score and Plan: 3  Airway Management Planned: Video Laryngoscope Planned  Additional Equipment:   Intra-op Plan:   Post-operative Plan:   Informed Consent: I have reviewed the patients History and Physical, chart, labs and discussed the procedure including the risks, benefits and alternatives for the proposed anesthesia with the patient or authorized representative who has indicated his/her understanding and acceptance.     Dental Advisory Given  Plan Discussed with: CRNA  Anesthesia Plan Comments:        Anesthesia Quick Evaluation

## 2023-05-28 NOTE — Progress Notes (Signed)
 Attending Progress Note  History: Thomas Cole is here for a new exacerbation of back and right lower extremity pain.  This is proximal to where his previous L4-5 disc herniation pain was.  This is more proximal in the anterior lateral thigh and is causing pain limited weakness in his iliopsoas and knee extensors.  He has some decreased reflexes.  He he also is having difficulty ambulating.  He was admitted yesterday for pain control and a steroid trial.  Physical Exam: Vitals:   05/27/23 1528 05/27/23 2343  BP: (!) 140/76 138/83  Pulse: (!) 103 96  Resp: 16 16  Temp: 97.8 F (36.6 C) 97.9 F (36.6 C)  SpO2: 99% 96%    AA Ox3 CNI  Strength: Continues to have some pain limited weakness in the right lower extremity proximally.  He gets severe shooting pains when attempting to extend or flex his knee and hip respectively.  He has slight decrease in reflexes bilaterally.  Distally he has improved significantly since his 4 5 discectomy.  Data:  Recent Labs  Lab 05/26/23 0010 05/26/23 0509  NA 136 136  K 3.8 3.9  CL 100 101  CO2 25 25  BUN 21* 26*  CREATININE 0.90 0.86  GLUCOSE 104* 92  CALCIUM 9.6 9.4   No results for input(s): "AST", "ALT", "ALKPHOS" in the last 168 hours.  Invalid input(s): "TBILI"   Recent Labs  Lab 05/26/23 0010 05/26/23 0509  WBC 8.2 8.4  HGB 14.0 14.0  HCT 40.3 39.7  PLT 173 174   No results for input(s): "APTT", "INR" in the last 168 hours.      MR HIP RIGHT WO CONTRAST Result Date: 05/26/2023 CLINICAL DATA:  Right hip pain.  No known injury EXAM: MR OF THE RIGHT HIP WITHOUT CONTRAST TECHNIQUE: Multiplanar, multisequence MR imaging was performed. No intravenous contrast was administered. COMPARISON:  X-ray 10/26/2020 FINDINGS: Bones: No acute fracture. No dislocation. No femoral head avascular necrosis. Bony pelvis intact without diastasis. SI joints and pubic symphysis within normal limits. No bone marrow edema of the pelvis or hips.  Degenerative lower lumbar spondylosis is partially imaged and was more fully assessed on recent dedicated lumbar spine MRI. No marrow replacing bone lesion. Articular cartilage and labrum Articular cartilage: Mild cartilage thinning and surface irregularity along the superior aspect of the right hip joint. Similar findings at the contralateral left hip. Labrum:  Superior labral degeneration.  No paralabral cyst. Joint or bursal effusion Joint effusion:  Small bilateral hip joint effusions. Bursae: No abnormal bursal fluid collection. Muscles and tendons Muscles and tendons: Mild tendinosis of the right hamstring tendon origin. The gluteal, iliopsoas, rectus femoris, and adductor tendons appear intact without tear or significant tendinosis. Right-sided posterior paraspinal intramuscular edema. Normal hip and pelvic muscle bulk and signal intensity without edema, atrophy, or fatty infiltration. Other findings Miscellaneous: No soft tissue edema or fluid collection. No inguinal lymphadenopathy. IMPRESSION: 1. No acute osseous abnormality of the pelvis or right hip. Mild osteoarthritis of the bilateral hips. 2. Small bilateral hip joint effusions, favored reactive/degenerative. 3. Mild tendinosis of the right hamstring tendon origin. 4. Right-sided posterior paraspinal intramuscular edema, which may be related to recent surgery or reflect muscle strain. Electronically Signed   By: Duanne Guess D.O.   On: 05/26/2023 12:23    Other tests/results: MRI was reviewed from yesterday of his lumbar spine demonstrate ongoing spinal stenosis as explained in the previous note  Assessment/Plan:  Thomas Cole with a history of a  recent right sided L4-5 disc herniation with discectomy and improvement.  Unfortunately he has had a new exacerbation of a more proximal dermatomal pain and myotomal involvement.  He had known severe stenosis at these levels but given the fact that they were not symptomatic at the time of his disc  herniation the disc herniation alone was taken care of.  He was recovering well but had an acute exacerbation causing difficulty with ambulation and necessitating a inpatient hospitalization.  He was given a steroid trial which did help some with his pain at rest, however whenever he attempts to stand or extends his back he gets severe shooting pains and a sense of giveaway weakness in his proximal lower extremities.  Given these findings and his difficulty with ambulation and progressive functional worsening we will plan for a lumbar laminectomy from L2-L5 to decompress his stenosis.  He does have some T2 signal change in the L2-3 and L3-4 discs which may correlate with his acute exacerbation, however given his difficulty with walking we do feel that a decompression is warranted.   Lovenia Kim, MD/MSCR Department of Neurosurgery

## 2023-05-29 ENCOUNTER — Encounter: Payer: Self-pay | Admitting: Neurosurgery

## 2023-05-29 DIAGNOSIS — M549 Dorsalgia, unspecified: Secondary | ICD-10-CM | POA: Diagnosis not present

## 2023-05-29 DIAGNOSIS — E1142 Type 2 diabetes mellitus with diabetic polyneuropathy: Secondary | ICD-10-CM | POA: Diagnosis not present

## 2023-05-29 DIAGNOSIS — M5459 Other low back pain: Secondary | ICD-10-CM | POA: Diagnosis not present

## 2023-05-29 DIAGNOSIS — I10 Essential (primary) hypertension: Secondary | ICD-10-CM | POA: Diagnosis not present

## 2023-05-29 LAB — GLUCOSE, CAPILLARY
Glucose-Capillary: 142 mg/dL — ABNORMAL HIGH (ref 70–99)
Glucose-Capillary: 174 mg/dL — ABNORMAL HIGH (ref 70–99)
Glucose-Capillary: 111 mg/dL — ABNORMAL HIGH (ref 70–99)
Glucose-Capillary: 126 mg/dL — ABNORMAL HIGH (ref 70–99)

## 2023-05-29 LAB — CBC
HCT: 37.8 % — ABNORMAL LOW (ref 39.0–52.0)
Hemoglobin: 12.9 g/dL — ABNORMAL LOW (ref 13.0–17.0)
MCH: 30.4 pg (ref 26.0–34.0)
MCHC: 34.1 g/dL (ref 30.0–36.0)
MCV: 89.2 fL (ref 80.0–100.0)
Platelets: 205 10*3/uL (ref 150–400)
RBC: 4.24 MIL/uL (ref 4.22–5.81)
RDW: 13.3 % (ref 11.5–15.5)
WBC: 13.9 10*3/uL — ABNORMAL HIGH (ref 4.0–10.5)
nRBC: 0 % (ref 0.0–0.2)

## 2023-05-29 LAB — BASIC METABOLIC PANEL
Anion gap: 10 (ref 5–15)
BUN: 35 mg/dL — ABNORMAL HIGH (ref 6–20)
CO2: 25 mmol/L (ref 22–32)
Calcium: 8.7 mg/dL — ABNORMAL LOW (ref 8.9–10.3)
Chloride: 100 mmol/L (ref 98–111)
Creatinine, Ser: 1.1 mg/dL (ref 0.61–1.24)
GFR, Estimated: >60 mL/min (ref >60)
Glucose, Bld: 167 mg/dL — ABNORMAL HIGH (ref 70–99)
Potassium: 3.9 mmol/L (ref 3.5–5.1)
Sodium: 135 mmol/L (ref 135–145)

## 2023-05-29 MED ORDER — HYDROMORPHONE HCL 1 MG/ML IJ SOLN
1.0000 mg | INTRAMUSCULAR | Status: DC | PRN
  Administered 2023-05-29 – 2023-05-31 (×4): 1 mg via INTRAVENOUS
  Filled 2023-05-29 (×4): qty 1

## 2023-05-29 MED ORDER — OXYCODONE HCL 5 MG PO TABS
10.0000 mg | ORAL_TABLET | ORAL | Status: DC | PRN
  Administered 2023-05-29 – 2023-05-31 (×9): 10 mg via ORAL
  Filled 2023-05-29 (×10): qty 2

## 2023-05-29 MED ORDER — OXYCODONE HCL 5 MG PO TABS
5.0000 mg | ORAL_TABLET | ORAL | Status: DC | PRN
  Administered 2023-05-30: 5 mg via ORAL

## 2023-05-29 NOTE — Plan of Care (Signed)

## 2023-05-29 NOTE — Plan of Care (Signed)

## 2023-05-29 NOTE — Progress Notes (Signed)
   Neurosurgery Progress Note  History: Thomas Cole is s/p L2-5 decompression   POD1: Patient feels as though his sensation in his legs is improved this morning.  He had some trouble overnight with pain control.  Physical Exam: Vitals:   05/28/23 2125 05/29/23 0112  BP: 122/75 105/66  Pulse: 86 77  Resp:  18  Temp:  97.9 F (36.6 C)  SpO2: 91% 96%    AA Ox3 CNI  Strength:5/5 throughout BLE Drain output 140 since surgery  Data:  Other tests/results: NA  Assessment/Plan:  Thomas Cole is a 53 year old with a history of right L4-5 discectomy with interval disc herniation and severe stenosis status post L2-5 decompression.  - mobilize - pain control; made changes to pain regimen this morning as the patient was on baseline narcotic preoperatively. - DVT prophylaxis - will continue drain for now.  - PTOT  Manning Charity PA-C Department of Neurosurgery

## 2023-05-29 NOTE — Evaluation (Signed)
 Occupational Therapy Evaluation Patient Details Name: Thomas Cole MRN: [id number] DOB: [birth year] Today's Date: 05/29/2023   History of Present Illness   Pt is a 53 yo male  s/p Lumbar laminectomy L2-L5. PMH of  anxiety, depression, asthma, type 2 diabetes mellitus, GERD, Parkinsonism, hypertension, migraine and seizure disorder, previous back surgeries as recent as 04/14/2023.     Clinical Impressions Pt was seen for OT evaluation this date. PTA, pt lives at home alone, but has family and neighbors nearby to assist. He was receiving [facility] therapy after recent back surgery in January. Reports IND/MOD I with mobility and ADLs, was driving and community mobile prior to his back surgeries.   Pt presents to acute OT demonstrating impaired ADL performance and functional mobility 2/2 pain, weakness, and low activity tolerance. Pain at 6/10 to his drain site on his back. Pt currently requires SUP for bed mobility with cueing for log roll technique. STS from slightly elevated EOB to RW with CGA and SPT to recliner with CGA/SBA and increased time. Pt educated in functional application of back precautions, log roll technique, AE/DME for bathing/dressing/ toileting, and home/routines modifications. Pt verbalized understanding of all education/training provided. Handout provided to support recall and carry over of learned precautions/techniques. Pt would benefit from skilled OT services to address noted impairments and functional limitations (see below for any additional details) in order to maximize safety and independence while minimizing falls risk and caregiver burden. Do anticipate the need for follow up OT services upon acute hospital DC.      If plan is discharge home, recommend the following:   A little help with walking and/or transfers;A little help with bathing/dressing/bathroom;Help with stairs or ramp for entrance;Assist for transportation     Functional Status Assessment   Patient has had  a recent decline in their functional status and demonstrates the ability to make significant improvements in function in a reasonable and predictable amount of time.     Equipment Recommendations   Other (comment) (reacher, long handled sponge, sock aide, shoe horn)     Recommendations for Other Services         Precautions/Restrictions   Precautions Precautions: Fall;Back Precaution Booklet Issued: Yes (comment) Recall of Precautions/Restrictions: Intact Restrictions Weight Bearing Restrictions Per Provider Order: No     Mobility Bed Mobility Overal bed mobility: Needs Assistance Bed Mobility: Supine to Sit     Supine to sit: Supervision, HOB elevated, Used rails     General bed mobility comments: cueing for log roll technique from semi-supine    Transfers Overall transfer level: Needs assistance Equipment used: Rolling [place] (2 wheels) Transfers: Sit to/from Stand, Bed to chair/wheelchair/BSC Sit to Stand: Contact guard assist, From elevated surface     Step pivot transfers: Contact guard assist     General transfer comment: CGA for STS from EOB with it slightly elevated and CGA for SPT to recliner using RW      Balance Overall balance assessment: Needs assistance Sitting-balance support: Feet supported Sitting balance-[name] Scale: Good     Standing balance support: Bilateral upper extremity supported, During functional activity Standing balance-[name] Scale: Fair Standing balance comment: CGA and RW use with no LOB                           ADL either performed or assessed with clinical judgement   ADL Overall ADL's : Needs assistance/impaired  Toilet Transfer: Contact guard assist;Rolling [place] (2 wheels) Toilet Transfer Details (indicate cue type and reason): simulated to recliner           General ADL Comments: likely Min A with LB ADLs     Vision         Perception         Praxis          Pertinent Vitals/Pain Pain Assessment Pain Assessment: 0-10 Pain Score: 6  Pain Location: at drain site on low back Pain Descriptors / Indicators: Sore Pain Intervention(s): Monitored during session, Repositioned     Extremity/Trunk Assessment Upper Extremity Assessment Upper Extremity Assessment: Overall WFL for tasks assessed   Lower Extremity Assessment Lower Extremity Assessment: Generalized weakness       Communication Communication Communication: No apparent difficulties   Cognition Arousal: Alert Behavior During Therapy: WFL for tasks assessed/performed Cognition: No apparent impairments                               Following commands: Intact       Cueing  General Comments          Exercises Other Exercises Other Exercises: Edu on role of OT in acute setting, back precautions, provided written handout on back precautions and educated on home modifications and use of long handled AE/AD to maximize IND at home.   Shoulder Instructions      Home Living Family/patient expects to be discharged to:: Private residence Living Arrangements: Alone Available Help at Discharge: Family;Friend(s);Neighbor;Available PRN/intermittently Type of Home: House Home Access: Stairs to enter [employer] of Steps: 2-3 Entrance Stairs-Rails: Right Home Layout: One level     Bathroom Shower/Tub: Tub/shower unit         Home Equipment: Agricultural consultant (2 wheels);Rollator (4 wheels);Wheelchair - power;Cane - single point;Shower seat          Prior Functioning/Environment Prior Level of Function : Driving;Independent/Modified Independent             Mobility Comments: is using a RW at home ADLs Comments: IND with ADLs, was edu by [facility] on long handled DME to purchase    OT Problem List: Decreased strength;Pain   OT Treatment/Interventions: Self-care/ADL training;Therapeutic activities;Therapeutic exercise;DME and/or AE  instruction;Patient/family education;Balance training      OT Goals(Current goals can be found in the care plan section)   Acute Rehab OT Goals Patient Stated Goal: go home OT Goal Formulation: With patient Time For Goal Achievement: 06/12/23 Potential to Achieve Goals: Good ADL Goals Pt Will Perform Lower Body Bathing: with supervision;sit to/from stand;sitting/lateral leans Pt Will Perform Lower Body Dressing: with supervision;sitting/lateral leans;with adaptive equipment;sit to/from stand Pt Will Transfer to Toilet: with supervision;with modified independence;ambulating;regular height toilet Additional ADL Goal #1: Pt will demo implementation of all back precautions and recall of all during ADL performance 3/3 trials to prevent injury upon DC home.   OT Frequency:  Min 1X/week    Co-evaluation              AM-PAC OT "6 Clicks" Daily Activity     Outcome Measure Help from another person eating meals?: None Help from another person taking care of personal grooming?: None Help from another person toileting, which includes using toliet, bedpan, or urinal?: A Little Help from another person bathing (including washing, rinsing, drying)?: A Little Help from another person to put on and taking off regular upper body clothing?: None Help from another  person to put on and taking off regular lower body clothing?: A Little 6 Click Score: 21   End of Session Equipment Utilized During Treatment: Rolling [place]or (2 wheels) Nurse Communication: Mobility status  Activity Tolerance: Patient tolerated treatment well Patient left: in chair;with call bell/phone within reach;with family/visitor present  OT Visit Diagnosis: Other abnormalities of gait and mobility (R26.89);Muscle weakness (generalized) (M62.81);Pain                Time: 4098-1191 OT Time Calculation (min): 20 min Charges:  OT General Charges $OT Visit: 1 Visit OT Evaluation $OT Eval Moderate Complexity: 1 Mod Marquite Attwood, OTR/L 05/29/23, 12:58 PM  Aariv Medlock E Kairi Tufo 05/29/2023, 12:52 PM

## 2023-05-29 NOTE — Evaluation (Signed)
 Physical Therapy Evaluation Patient Details Name: Thomas Cole MRN: [id number] DOB: [birth year] Today's Date: 05/29/2023  History of Present Illness  Pt is a 53 yo male  s/p Lumbar laminectomy L2-L5. PMH of  anxiety, depression, asthma, type 2 diabetes mellitus, GERD, Parkinsonism, hypertension, migraine and seizure disorder, previous back surgeries as recent as 04/14/2023.  Clinical Impression  Pt A&Ox4, reported at baseline he lives alone, has assistance that could stop by if he needed it, uses his RW in his house. He was up in the recliner upon PT entrance, reported 4-5/10 low back pain during session. He was able to transfer and ambulate with supervision with use of RW. Noted for decreased velocity but steady and safe. Stair navigation with CGA and verbal cues to improve technique and maximize safety, pt verbalized and demonstrated understanding.  Overall the patient demonstrated deficits (see "PT Problem List") that impede the patient's functional abilities, safety, and mobility and would benefit from skilled PT intervention.          If plan is discharge home, recommend the following: Assistance with cooking/housework;Assist for transportation;Help with stairs or ramp for entrance   Can travel by private vehicle        Equipment Recommendations None recommended by PT  Recommendations for Other Services       Functional Status Assessment Patient has had a recent decline in their functional status and demonstrates the ability to make significant improvements in function in a reasonable and predictable amount of time.     Precautions / Restrictions Precautions Precautions: Fall;Back Precaution Booklet Issued: Yes (comment) Recall of Precautions/Restrictions: Intact Restrictions Weight Bearing Restrictions Per Provider Order: No      Mobility  Bed Mobility               General bed mobility comments: pt seated in recliner at start/end of session    Transfers Overall  transfer level: Needs assistance Equipment used: Rolling [place] (2 wheels) Transfers: Sit to/from Stand Sit to Stand: Supervision                Ambulation/Gait Ambulation/Gait assistance: Supervision Gait Distance (Feet): 220 Feet Assistive device: Rolling Bedgood (2 wheels)         General Gait Details: decreased velocity but steady, safe  Stairs Stairs: Yes Stairs assistance: Contact guard assist Stair Management: One rail Right, Step to pattern, Alternating pattern, Forwards, Sideways Number of Stairs: 6 General stair comments: alternating pattern to ascend, step to to descend with verbal cues and reminders for technique throughout  Wheelchair Mobility     Tilt Bed    Modified Rankin (Stroke Patients Only)       Balance Overall balance assessment: Needs assistance Sitting-balance support: Feet supported Sitting balance-[name] Scale: Good     Standing balance support: Bilateral upper extremity supported, During functional activity Standing balance-[name] Scale: Fair                               Pertinent Vitals/Pain Pain Assessment Pain Assessment: 0-10 Pain Score: 5  Pain Location: in low back and drain site after walking Pain Descriptors / Indicators: Sore Pain Intervention(s): Limited activity within patient's tolerance, Monitored during session, Repositioned, Premedicated before session    Home Living Family/patient expects to be discharged to:: Private residence Living Arrangements: Alone Available Help at Discharge: Family;Friend(s);Neighbor;Available PRN/intermittently Type of Home: House Home Access: Stairs to enter Entrance Stairs-Rails: Right Entrance Stairs-Number of Steps: 2-3   Home Layout:  One level Home Equipment: Agricultural consultant (2 wheels);Rollator (4 wheels);Wheelchair - power;Cane - single point      Prior Function Prior Level of Function : Driving;Independent/Modified Independent             Mobility  Comments: is using a RW at home       Extremity/Trunk Assessment   Upper Extremity Assessment Upper Extremity Assessment: Defer to OT evaluation    Lower Extremity Assessment Lower Extremity Assessment:  (MMT deferred due to pain/surgery but pt able to move BLE against gravity without assistance)       Communication        Cognition Arousal: Alert Behavior During Therapy: WFL for tasks assessed/performed   PT - Cognitive impairments: No apparent impairments                                 Cueing       General Comments      Exercises     Assessment/Plan    PT Assessment Patient needs continued PT services  PT Problem List Decreased strength;Decreased activity tolerance;Decreased balance;Decreased mobility;Pain       PT Treatment Interventions DME instruction;Balance training;Gait training;Neuromuscular re-education;Stair training;Functional mobility training;Patient/family education;Therapeutic activities;Therapeutic exercise    PT Goals (Current goals can be found in the Care Plan section)  Acute Rehab PT Goals Patient Stated Goal: to improve function and mobility PT Goal Formulation: With patient Time For Goal Achievement: 06/12/23 Potential to Achieve Goals: Good    Frequency 7X/week     Co-evaluation               AM-PAC PT "6 Clicks" Mobility  Outcome Measure Help needed turning from your back to your side while in a flat bed without using bedrails?: A Little Help needed moving from lying on your back to sitting on the side of a flat bed without using bedrails?: A Little Help needed moving to and from a bed to a chair (including a wheelchair)?: None Help needed standing up from a chair using your arms (e.g., wheelchair or bedside chair)?: None Help needed to walk in hospital room?: None Help needed climbing 3-5 steps with a railing? : A Little 6 Click Score: 21    End of Session Equipment Utilized During Treatment: Gait  belt Activity Tolerance: Patient tolerated treatment well Patient left: in chair;with call bell/phone within reach Nurse Communication: Mobility status PT Visit Diagnosis: Other abnormalities of gait and mobility (R26.89);Difficulty in walking, not elsewhere classified (R26.2);Muscle weakness (generalized) (M62.81);Pain Pain - Right/Left:  (midline) Pain - part of body:  (low back)    Time: 2956-2130 PT Time Calculation (min) (ACUTE ONLY): 19 min   Charges:   PT Evaluation $PT Eval Low Complexity: 1 Low PT Treatments $Gait Training: 8-22 mins PT General Charges $$ ACUTE PT VISIT: 1 Visit       Olga Coaster PT, DPT 10:40 AM,05/29/23

## 2023-05-29 NOTE — Progress Notes (Addendum)
 Progress Note   Patient: Thomas Cole:[id number] DOB: 1970/06/23 DOA: 05/25/2023     2 DOS: the patient was seen and examined on 05/29/2023   Brief hospital course: Taken from H&P.  JUELL RADNEY is a 53 y.o. male with medical history significant for anxiety, depression, asthma, type 2 diabetes mellitus, GERD, Parkinsonism, hypertension, migraine and seizure disorder, who presented to the emergency room with acute onset of intractable low back pain.  He admits to right hip pain with decreased range of motion due to pain.  The patient underwent L4-L5 microdiscectomy on 04/14/2023.   On presentation stable vital and labs. Lumbar spine MRI without contrast revealed the following: 1. Status post right L4-5 laminectomy with unchanged severe spinal canal and bilateral neural foraminal stenosis. 2. Unchanged severe spinal canal stenosis at L2-L3 and L3-L4. 3. Unchanged moderate left L5-S1 neural foraminal stenosis.  Patient was admitted for pain control and neurosurgery was consulted.  2/23: Vital stable except borderline tachycardia,MRI of right hip was negative for any acute abnormality, did show some right sided posterior paraspinal intramuscular edema and degenerative changes.  Neurosurgery is recommending high-dose steroid and if continue to have significant pain then they might take him back to the OR for further decompression.  2/24:Vital Stable, pain little better with steroid.  2/25: Vital stable, going for lumbar laminectomy from L2-L5 to decompress the stenosis today.  2/26: Vital stable, s/p Lumbar laminectomy medial facetectomy lateral recess decompression from L2-L5 with neurosurgery yesterday, labs with leukocytosis today at 13.9 likely reactive after the surgery, hemoglobin decreased to 12.9 from 14, likely will need to stay 2-3 more days per neurosurgery.   Assessment and Plan: * Intractable low back pain  lumbar MRI with severe spinal canal and bilateral neuronal foraminal  stenosis.  S/p surgical decompression on 2/25 Right hip MRI was negative for any acute significant abnormality -Neurosurgery is on board -likely will stay 2-3 more days -Continue with pain management  Essential hypertension - We will continue antihypertensive therapy.  Type 2 diabetes mellitus with peripheral neuropathy (HCC) Patient was on metformin at home. -Started on moderate SSI-might need more insulin as he is getting steroid. - We will continue Lyrica - We will hold off metformin.  Anxiety and depression - We will continue Klonopin and Cymbalta.  Parkinsonism (HCC) - We will continue Sinemet.   Subjective: Patient continued to have significant back pain with body movements.  Physical Exam: Vitals:   05/28/23 2100 05/28/23 2125 05/29/23 0112 05/29/23 0819  BP: 121/70 122/75 105/66 115/74  Pulse: 94 86 77 72  Resp: 14  18 15   Temp: 97.6 F (36.4 C)  97.9 F (36.6 C) (!) 97.5 F (36.4 C)  TempSrc:   Oral Oral  SpO2: 94% 91% 96% 98%  Weight:      Height:       General.  Obese gentleman, in no acute distress. Pulmonary.  Lungs clear bilaterally, normal respiratory effort. CV.  Regular rate and rhythm, no JVD, rub or murmur. Abdomen.  Soft, nontender, nondistended, BS positive. CNS.  Alert and oriented .  No focal neurologic deficit. Extremities.  No edema, no cyanosis, pulses intact and symmetrical. Psychiatry.  Judgment and insight appears normal.   Data Reviewed: Prior data reviewed  Family Communication: Discussed with patient  Disposition: Status is: In patient. The patient will require care spanning > 2 midnights and should be moved to inpatient because: Severity of illness  Planned Discharge Destination: Home   Time spent: 45 minutes  This  record has been created using Conservation officer, historic buildings. Errors have been sought and corrected,but may not always be located. Such creation errors do not reflect on the standard of care.    Author: Arnetha Courser, MD 05/29/2023 3:31 PM  For on call review [email or web address].

## 2023-05-30 DIAGNOSIS — M5459 Other low back pain: Secondary | ICD-10-CM | POA: Diagnosis not present

## 2023-05-30 LAB — GLUCOSE, CAPILLARY
Glucose-Capillary: 97 mg/dL (ref 70–99)
Glucose-Capillary: 118 mg/dL — ABNORMAL HIGH (ref 70–99)
Glucose-Capillary: 111 mg/dL — ABNORMAL HIGH (ref 70–99)
Glucose-Capillary: 122 mg/dL — ABNORMAL HIGH (ref 70–99)

## 2023-05-30 NOTE — Progress Notes (Signed)
 Progress Note   Patient: Thomas Cole ZOX:[id number] DOB: 09/07/1970 DOA: 05/25/2023     3 DOS: the patient was seen and examined on 05/30/2023   Brief hospital course: Taken from H&P.  Thomas Cole is a 53 y.o. male with medical history significant for anxiety, depression, asthma, type 2 diabetes mellitus, GERD, Parkinsonism, hypertension, migraine and seizure disorder, who presented to the emergency room with acute onset of intractable low back pain.  He admits to right hip pain with decreased range of motion due to pain.  The patient underwent L4-L5 microdiscectomy on 04/14/2023.   On presentation stable vital and labs. Lumbar spine MRI without contrast revealed the following: 1. Status post right L4-5 laminectomy with unchanged severe spinal canal and bilateral neural foraminal stenosis. 2. Unchanged severe spinal canal stenosis at L2-L3 and L3-L4. 3. Unchanged moderate left L5-S1 neural foraminal stenosis.  Patient was admitted for pain control and neurosurgery was consulted.  2/23: Vital stable except borderline tachycardia,MRI of right hip was negative for any acute abnormality, did show some right sided posterior paraspinal intramuscular edema and degenerative changes.  Neurosurgery is recommending high-dose steroid and if continue to have significant pain then they might take him back to the OR for further decompression.  2/24:Vital Stable, pain little better with steroid.  2/25: Vital stable, going for lumbar laminectomy from L2-L5 to decompress the stenosis today.  2/26: Vital stable, s/p Lumbar laminectomy medial facetectomy lateral recess decompression from L2-L5 with neurosurgery yesterday, labs with leukocytosis today at 13.9 likely reactive after the surgery, hemoglobin decreased to 12.9 from 14, likely will need to stay 2-3 more days per neurosurgery.  2/27: Vitals remained stable.  Sensation in lower extremities seems to be improving.  He was able to walk a little more  with PT today.  Drain output decreasing. Plan for removal of drain likely tomorrow morning and possible discharge.    Assessment and Plan: * Intractable low back pain  lumbar MRI with severe spinal canal and bilateral neuronal foraminal stenosis.  S/p surgical decompression on 2/25 Right hip MRI was negative for any acute significant abnormality -Neurosurgery is on board, plan to remove drain tomorrow -Continue with pain management  Essential hypertension - We will continue antihypertensive therapy.  Type 2 diabetes mellitus with peripheral neuropathy (HCC) Patient was on metformin at home. -Started on moderate SSI-might need more insulin as he is getting steroid. - We will continue Lyrica - Continue to hold metformin.  Anxiety and depression - We will continue Klonopin and Cymbalta.  Parkinsonism (HCC) - We will continue Sinemet.   Subjective: He continues to have back pain that was worse at night but reports some improvement in his lower extremity sensation.  He was able to do more with PT.  Physical Exam: Vitals:   05/29/23 2258 05/30/23 0700 05/30/23 0921 05/30/23 1554  BP: 100/61 118/68 117/66 113/77  Pulse: 75 78 78 87  Resp: 19 17 17 18   Temp: 98 F (36.7 C) 98.2 F (36.8 C) 98.1 F (36.7 C) 98.2 F (36.8 C)  TempSrc:      SpO2: 98%  95% 100%  Weight:      Height:       General: Pleasant, obese middle-age man laying in bed. No acute distress. HEENT: [place]/AT. Anicteric sclera CV: RRR. No murmurs, rubs, or gallops. No LE edema Pulmonary: Lungs CTAB. Normal effort. No wheezing or rales. Abdominal: Soft, nontender, nondistended. Normal bowel sounds. MSK: Limited range of motion of the back. Skin: Warm and  dry. No obvious rash or lesions. Neuro: A&Ox3.  Some weakness and decreased sensation in the lower extremities. Psych: Normal mood and affect   Data Reviewed: Prior data reviewed  Family Communication: No family at bedside  Disposition: Status is: In  patient. The patient will require care spanning > 2 midnights and should be moved to inpatient because: Severity of illness  Planned Discharge Destination: Home   Time spent: 45 minutes  This record has been created using Conservation officer, historic buildings. Errors have been sought and corrected,but may not always be located. Such creation errors do not reflect on the standard of care.   Author: Steffanie Rainwater, MD 05/30/2023 5:39 PM  For on call review [email or web address].

## 2023-05-30 NOTE — Discharge Instructions (Signed)
 Your surgeon has performed an operation on your lumbar spine (low back) to relieve pressure on one or more nerves. Many times, patients feel better immediately after surgery and can "overdo it." Even if you feel well, it is important that you follow these activity guidelines. If you do not let your back heal properly from the surgery, you can increase the chance of a disc herniation and/or return of your symptoms. The following are instructions to help in your recovery once you have been discharged from the hospital.  * It is ok to take NSAIDs after surgery.  Activity    No bending, lifting, or twisting ("BLT"). Avoid lifting objects heavier than 10 pounds (gallon milk jug).  Where possible, avoid household activities that involve lifting, bending, pushing, or pulling such as laundry, vacuuming, grocery shopping, and childcare. Try to arrange for help from friends and family for these activities while your back heals.  Increase physical activity slowly as tolerated.  Taking short walks is encouraged, but avoid strenuous exercise. Do not jog, run, bicycle, lift weights, or participate in any other exercises unless specifically allowed by your doctor. Avoid prolonged sitting, including car rides.  Talk to your doctor before resuming sexual activity.  You should not drive until cleared by your doctor.  Until released by your doctor, you should not return to work or school.  You should rest at home and let your body heal.   You may shower three days after your surgery.  After showering, lightly dab your incision dry. Do not take a tub bath or go swimming for 3 weeks, or until approved by your doctor at your follow-up appointment.  If you smoke, we strongly recommend that you quit.  Smoking has been proven to interfere with normal healing in your back and will dramatically reduce the success rate of your surgery. Please contact [facility] (800-QUIT-NOW) and use the resources at www.[facility].com for  assistance in stopping smoking.  Surgical Incision   If you have a dressing on your incision, you may remove it three days after your surgery. Keep your incision area clean and dry.  If you have staples or stitches on your incision, you should have a follow up scheduled for removal. If you do not have staples or stitches, you will have steri-strips (small pieces of surgical tape) or Dermabond glue. The steri-strips/glue should begin to peel away within about a week (it is fine if the steri-strips fall off before then). If the strips are still in place one week after your surgery, you may gently remove them.  Diet            You may return to your usual diet. Be sure to stay hydrated.  When to Contact us  Although your surgery and recovery will likely be uneventful, you may have some residual numbness, aches, and pains in your back and/or legs. This is normal and should improve in the next few weeks.  However, should you experience any of the following, contact us immediately: New numbness or weakness Pain that is progressively getting worse, and is not relieved by your pain medications or rest Bleeding, redness, swelling, pain, or drainage from surgical incision Chills or flu-like symptoms Fever greater than 101.0 F (38.3 C) Problems with bowel or bladder functions Difficulty breathing or shortness of breath Warmth, tenderness, or swelling in your calf  Contact Information How to contact us:  If you have any questions/concerns before or after surgery, you can reach [place] at [phone], or you can  send a [employer]. We can be reached by phone or mychart 8am-4pm, Monday-Friday.  *Please note: Calls after 4pm are forwarded to a third party answering service. Mychart messages are not routinely monitored during evenings, weekends, and holidays. Please call our office to contact the answering service for urgent concerns during non-business hours.

## 2023-05-30 NOTE — Progress Notes (Signed)
 Physical Therapy Treatment Patient Details Name: Thomas Cole MRN: [id number] DOB: December 11, 1970 Today's Date: 05/30/2023   History of Present Illness Pt is a 53 yo male  s/p Lumbar laminectomy L2-L5. PMH of  anxiety, depression, asthma, type 2 diabetes mellitus, GERD, Parkinsonism, hypertension, migraine and seizure disorder, previous back surgeries as recent as 04/14/2023.    PT Comments  Pt received in bed, able to transfer to EOB with HOB raised and Supervision. Upon returning to bed, pt required assistance to help raise LE's up onto bed due to discomfort at drain site. Pt tolerated 200+ ft of gait training with RW, good upright posture, no LOB, no tingling in LE's, appears steady. Pt performed well on stairs yesterday. Will continue to progress acutely.    If plan is discharge home, recommend the following: Assistance with cooking/housework;Assist for transportation;Help with stairs or ramp for entrance   Can travel by private vehicle        Equipment Recommendations  None recommended by PT    Recommendations for Other Services       Precautions / Restrictions Precautions Precautions: Fall;Back Precaution Booklet Issued: Yes (comment) Recall of Precautions/Restrictions: Intact Restrictions Weight Bearing Restrictions Per Provider Order: No     Mobility  Bed Mobility Overal bed mobility: Needs Assistance Bed Mobility: Supine to Sit, Sit to Supine     Supine to sit: Supervision, HOB elevated, Used rails Sit to supine: Min assist (For B LE's up onto bed)   General bed mobility comments: cueing for log roll technique from semi-supine    Transfers Overall transfer level: Needs assistance Equipment used: Rolling Dicesare (2 wheels) Transfers: Sit to/from Stand Sit to Stand: Supervision           General transfer comment: Supervision to raise from low toilet with wall rail    Ambulation/Gait Ambulation/Gait assistance: Supervision Gait Distance (Feet): 200  Feet Assistive device: Rolling Eyerman (2 wheels) Gait Pattern/deviations: Step-through pattern Gait velocity: decr     General Gait Details: decreased velocity but steady, safe   Stairs             Wheelchair Mobility     Tilt Bed    Modified Rankin (Stroke Patients Only)       Balance Overall balance assessment: Needs assistance Sitting-balance support: Feet supported Sitting balance-[name] Scale: Good     Standing balance support: Bilateral upper extremity supported, During functional activity, Reliant on assistive device for balance Standing balance-[name] Scale: Good                              Communication Communication Communication: No apparent difficulties  Cognition Arousal: Alert Behavior During Therapy: WFL for tasks assessed/performed   PT - Cognitive impairments: No apparent impairments                         Following commands: Intact      Cueing    Exercises      General Comments General comments (skin integrity, edema, etc.): Discussed home set up and current LOF. Pt still has HHPT exercises from previous visit and feels he will fine w/o HH services      Pertinent Vitals/Pain Pain Assessment Pain Assessment: 0-10 Pain Score: 6  Pain Location: at drain site on low back Pain Descriptors / Indicators: Sore Pain Intervention(s): Monitored during session    Home Living  Prior Function            PT Goals (current goals can now be found in the care plan section) Acute Rehab PT Goals Patient Stated Goal: to improve function and mobility Progress towards PT goals: Progressing toward goals    Frequency    7X/week      PT Plan      Co-evaluation              AM-PAC PT "6 Clicks" Mobility   Outcome Measure  Help needed turning from your back to your side while in a flat bed without using bedrails?: A Little Help needed moving from lying on your back to  sitting on the side of a flat bed without using bedrails?: A Little Help needed moving to and from a bed to a chair (including a wheelchair)?: None Help needed standing up from a chair using your arms (e.g., wheelchair or bedside chair)?: None Help needed to walk in hospital room?: None Help needed climbing 3-5 steps with a railing? : A Little 6 Click Score: 21    End of Session   Activity Tolerance: Patient tolerated treatment well Patient left: in bed;with call bell/phone within reach Nurse Communication: Mobility status PT Visit Diagnosis: Other abnormalities of gait and mobility (R26.89);Difficulty in walking, not elsewhere classified (R26.2);Muscle weakness (generalized) (M62.81);Pain Pain - Right/Left:  (drain site) Pain - part of body:  (Back)     Time: 1191-4782 PT Time Calculation (min) (ACUTE ONLY): 17 min  Charges:    $Gait Training: 8-22 mins PT General Charges $$ ACUTE PT VISIT: 1 Visit                    Zadie Cleverly, PTA  Jannet Askew 05/30/2023, 2:36 PM

## 2023-05-30 NOTE — Progress Notes (Signed)
 Occupational Therapy Treatment Patient Details Name: Thomas Cole MRN: [id number] DOB: 10/21/1970 Today's Date: 05/30/2023   History of present illness Pt is a 53 yo male  s/p Lumbar laminectomy L2-L5. PMH of  anxiety, depression, asthma, type 2 diabetes mellitus, GERD, Parkinsonism, hypertension, migraine and seizure disorder, previous back surgeries as recent as 04/14/2023.   OT comments  Pt is supine in bed on arrival. Easily arousable and agreeable to OT session. He reports decreased pain to 6/10 at JP drain site in his back, has been medicated well this morning. Pt performed bed mobility via log roll with MOD I. STS from EOB with SUP and ambulated to the bathroom and back using RW with SUP/SBA. Pt able to perform grooming tasks standing at sink with unilateral support and SUP from therapist for safety.  LB dressing to don/doff socks seated in recliner with SUP, cueing for proper technique and re-edu on back precautions/proper body mechanics. Pt left seated in recliner with all needs in place and will cont to require skilled acute OT services to maximize his safety and IND to return to PLOF.       If plan is discharge home, recommend the following:  A little help with walking and/or transfers;A little help with bathing/dressing/bathroom;Help with stairs or ramp for entrance;Assist for transportation   Equipment Recommendations  Other (comment) (long handled equipment)    Recommendations for Other Services      Precautions / Restrictions Precautions Precautions: Fall;Back Precaution Booklet Issued: Yes (comment) Recall of Precautions/Restrictions: Intact Restrictions Weight Bearing Restrictions Per Provider Order: No       Mobility Bed Mobility Overal bed mobility: Modified Independent                  Transfers Overall transfer level: Needs assistance Equipment used: Rolling Gentile (2 wheels) Transfers: Sit to/from Stand Sit to Stand: Supervision            General transfer comment: SUP for STS from EOB and in room mobility using RW to the bathroom and back     Balance Overall balance assessment: Needs assistance Sitting-balance support: Feet supported Sitting balance-[name] Scale: Good     Standing balance support: During functional activity, Single extremity supported Standing balance-[name] Scale: Good                             ADL either performed or assessed with clinical judgement   ADL Overall ADL's : Needs assistance/impaired     Grooming: Oral care;Wash/dry hands;Wash/dry face;Set up;Standing Grooming Details (indicate cue type and reason): at bathroom sink with unilateral support for balance; edu on back precautions and adaptive and modified ways to perform             Lower Body Dressing: Supervision/safety;Sitting/lateral leans Lower Body Dressing Details (indicate cue type and reason): to doff/don socks seated in recliner Toilet Transfer: Supervision/safety;Rolling Cinelli (2 wheels) Toilet Transfer Details (indicate cue type and reason): simulated to recliner                Extremity/Trunk Assessment              Vision       Perception     Praxis     Communication Communication Communication: No apparent difficulties   Cognition Arousal: Alert Behavior During Therapy: WFL for tasks assessed/performed Cognition: No apparent impairments  Following commands: Intact        Cueing      Exercises      Shoulder Instructions       General Comments      Pertinent Vitals/ Pain       Pain Assessment Pain Assessment: 0-10 Pain Score: 6  Pain Location: at drain site on low back Pain Descriptors / Indicators: Sore Pain Intervention(s): Monitored during session, Repositioned  Home Living                                          Prior Functioning/Environment              Frequency  Min 1X/week         Progress Toward Goals  OT Goals(current goals can now be found in the care plan section)  Progress towards OT goals: Progressing toward goals  Acute Rehab OT Goals Patient Stated Goal: go home OT Goal Formulation: With patient Time For Goal Achievement: 06/12/23 Potential to Achieve Goals: Good  Plan      Co-evaluation                 AM-PAC OT "6 Clicks" Daily Activity     Outcome Measure   Help from another person eating meals?: None Help from another person taking care of personal grooming?: None Help from another person toileting, which includes using toliet, bedpan, or urinal?: A Little Help from another person bathing (including washing, rinsing, drying)?: A Little Help from another person to put on and taking off regular upper body clothing?: None Help from another person to put on and taking off regular lower body clothing?: A Little 6 Click Score: 21    End of Session Equipment Utilized During Treatment: Rolling Eissler (2 wheels)  OT Visit Diagnosis: Other abnormalities of gait and mobility (R26.89);Muscle weakness (generalized) (M62.81);Pain   Activity Tolerance Patient tolerated treatment well   Patient Left in chair;with call bell/phone within reach;with family/visitor present   Nurse Communication Mobility status        Time: 1610-9604 OT Time Calculation (min): 17 min  Charges: OT Treatments $Self Care/Home Management : 8-22 mins  [facility], OTR/L  05/30/23, 11:56 AM   Constance Goltz 05/30/2023, 11:54 AM

## 2023-05-30 NOTE — Plan of Care (Signed)

## 2023-05-30 NOTE — Care Management Important Message (Signed)
 Important Message  Patient Details  Name: Thomas Cole MRN: [id number] Date of Birth: 10-20-1970   Important Message Given:  Yes - Medicare IM     Cristela Blue, CMA 05/30/2023, 11:16 AM

## 2023-05-30 NOTE — Progress Notes (Signed)
   Neurosurgery Progress Note  History: Thomas Cole is s/p L2-5 decompression   POD2: Pt doing well this morning.  He states that he walked better yesterday than he has in a long time. POD1: Patient feels as though his sensation in his legs is improved this morning.  He had some trouble overnight with pain control.  Physical Exam: Vitals:   05/29/23 2258 05/30/23 0700  BP: 100/61 118/68  Pulse: 75 78  Resp: 19 17  Temp: 98 F (36.7 C) 98.2 F (36.8 C)  SpO2: 98%     AA Ox3 CNI  Strength:5/5 throughout BLE Drain output 100 overnight   Data:  Other tests/results: NA  Assessment/Plan:  Thomas Cole is a 53 year old with a history of right L4-5 discectomy with interval disc herniation and severe stenosis status post L2-5 decompression.  - mobilize - pain control; made changes to pain regimen this morning as the patient was on baseline narcotic preoperatively. - DVT prophylaxis - will continue drain for now.  - PTOT  Manning Charity PA-C Department of Neurosurgery

## 2023-05-30 NOTE — Plan of Care (Signed)
  Problem: Education: Goal: Knowledge of General Education information will improve Description: Including pain rating scale, medication(s)/side effects and non-pharmacologic comfort measures 05/30/2023 1807 by Collene Gobble, RN Outcome: Progressing 05/30/2023 1807 by Collene Gobble, RN Outcome: Progressing   Problem: Health Behavior/Discharge Planning: Goal: Ability to manage health-related needs will improve 05/30/2023 1807 by Collene Gobble, RN Outcome: Progressing 05/30/2023 1807 by Collene Gobble, RN Outcome: Progressing   Problem: Clinical Measurements: Goal: Ability to maintain clinical measurements within normal limits will improve 05/30/2023 1807 by Collene Gobble, RN Outcome: Progressing 05/30/2023 1807 by Collene Gobble, RN Outcome: Progressing Goal: Will remain free from infection 05/30/2023 1807 by Collene Gobble, RN Outcome: Progressing 05/30/2023 1807 by Collene Gobble, RN Outcome: Progressing Goal: Diagnostic test results will improve 05/30/2023 1807 by Collene Gobble, RN Outcome: Progressing 05/30/2023 1807 by Collene Gobble, RN Outcome: Progressing Goal: Respiratory complications will improve 05/30/2023 1807 by Collene Gobble, RN Outcome: Progressing 05/30/2023 1807 by Collene Gobble, RN Outcome: Progressing Goal: Cardiovascular complication will be avoided 05/30/2023 1807 by Collene Gobble, RN Outcome: Progressing 05/30/2023 1807 by Collene Gobble, RN Outcome: Progressing   Problem: Activity: Goal: Risk for activity intolerance will decrease Outcome: Progressing   Problem: Nutrition: Goal: Adequate nutrition will be maintained Outcome: Progressing   Problem: Coping: Goal: Level of anxiety will decrease Outcome: Progressing   Problem: Elimination: Goal: Will not experience complications related to bowel motility Outcome: Progressing Goal: Will not experience complications related to urinary retention Outcome: Progressing   Problem: Pain  Managment: Goal: General experience of comfort will improve and/or be controlled Outcome: Progressing   Problem: Safety: Goal: Ability to remain free from injury will improve Outcome: Progressing   Problem: Skin Integrity: Goal: Risk for impaired skin integrity will decrease Outcome: Progressing   Problem: Education: Goal: Ability to describe self-care measures that may prevent or decrease complications (Diabetes Survival Skills Education) will improve Outcome: Progressing Goal: Individualized Educational Video(s) Outcome: Progressing   Problem: Coping: Goal: Ability to adjust to condition or change in health will improve Outcome: Progressing   Problem: Fluid Volume: Goal: Ability to maintain a balanced intake and output will improve Outcome: Progressing   Problem: Health Behavior/Discharge Planning: Goal: Ability to identify and utilize available resources and services will improve Outcome: Progressing Goal: Ability to manage health-related needs will improve Outcome: Progressing   Problem: Metabolic: Goal: Ability to maintain appropriate glucose levels will improve Outcome: Progressing   Problem: Nutritional: Goal: Maintenance of adequate nutrition will improve Outcome: Progressing Goal: Progress toward achieving an optimal weight will improve Outcome: Progressing   Problem: Skin Integrity: Goal: Risk for impaired skin integrity will decrease Outcome: Progressing   Problem: Tissue Perfusion: Goal: Adequacy of tissue perfusion will improve Outcome: Progressing

## 2023-05-31 DIAGNOSIS — M5106 Intervertebral disc disorders with myelopathy, lumbar region: Secondary | ICD-10-CM

## 2023-05-31 DIAGNOSIS — E1142 Type 2 diabetes mellitus with diabetic polyneuropathy: Secondary | ICD-10-CM | POA: Diagnosis not present

## 2023-05-31 DIAGNOSIS — M48061 Spinal stenosis, lumbar region without neurogenic claudication: Secondary | ICD-10-CM | POA: Diagnosis not present

## 2023-05-31 DIAGNOSIS — G20C Parkinsonism, unspecified: Secondary | ICD-10-CM

## 2023-05-31 DIAGNOSIS — I10 Essential (primary) hypertension: Secondary | ICD-10-CM | POA: Diagnosis not present

## 2023-05-31 DIAGNOSIS — M5459 Other low back pain: Secondary | ICD-10-CM | POA: Diagnosis not present

## 2023-05-31 LAB — BASIC METABOLIC PANEL
Anion gap: 8 (ref 5–15)
BUN: 23 mg/dL — ABNORMAL HIGH (ref 6–20)
CO2: 26 mmol/L (ref 22–32)
Calcium: 8.9 mg/dL (ref 8.9–10.3)
Chloride: 99 mmol/L (ref 98–111)
Creatinine, Ser: 0.94 mg/dL (ref 0.61–1.24)
GFR, Estimated: >60 mL/min (ref >60)
Glucose, Bld: 119 mg/dL — ABNORMAL HIGH (ref 70–99)
Potassium: 3.3 mmol/L — ABNORMAL LOW (ref 3.5–5.1)
Sodium: 133 mmol/L — ABNORMAL LOW (ref 135–145)

## 2023-05-31 LAB — GLUCOSE, CAPILLARY
Glucose-Capillary: 118 mg/dL — ABNORMAL HIGH (ref 70–99)
Glucose-Capillary: 106 mg/dL — ABNORMAL HIGH (ref 70–99)

## 2023-05-31 LAB — CBC
HCT: 38.5 % — ABNORMAL LOW (ref 39.0–52.0)
Hemoglobin: 13.3 g/dL (ref 13.0–17.0)
MCH: 30.7 pg (ref 26.0–34.0)
MCHC: 34.5 g/dL (ref 30.0–36.0)
MCV: 88.9 fL (ref 80.0–100.0)
Platelets: 184 10*3/uL (ref 150–400)
RBC: 4.33 MIL/uL (ref 4.22–5.81)
RDW: 13.2 % (ref 11.5–15.5)
WBC: 11 10*3/uL — ABNORMAL HIGH (ref 4.0–10.5)
nRBC: 0 % (ref 0.0–0.2)

## 2023-05-31 LAB — MAGNESIUM: Magnesium: 2 mg/dL (ref 1.7–2.4)

## 2023-05-31 MED ORDER — CYCLOBENZAPRINE HCL 10 MG PO TABS: 10.0000 mg | ORAL_TABLET | Freq: Three times a day (TID) | ORAL | 0 refills | Status: DC | PRN

## 2023-05-31 MED ORDER — POTASSIUM CHLORIDE CRYS ER 20 MEQ PO TBCR
40.0000 meq | EXTENDED_RELEASE_TABLET | Freq: Once | ORAL | Status: AC
  Administered 2023-05-31: 40 meq via ORAL
  Filled 2023-05-31: qty 2

## 2023-05-31 NOTE — Plan of Care (Signed)

## 2023-05-31 NOTE — Discharge Summary (Signed)
 Physician Discharge Summary   Patient: Thomas Cole MRN: [id number] DOB: 08-17-1970  Admit date:     05/25/2023  Discharge date: 05/31/23  Discharge Physician: Arnetha Courser   PCP: Kandyce Rud, MD   Recommendations at discharge:  Please obtain CBC and BMP on follow-up Follow-up with neurosurgery Follow-up with primary care provider  Discharge Diagnoses: Principal Problem:   Intractable low back pain Active Problems:   Spinal stenosis of lumbar region with radiculopathy   Essential hypertension   Type 2 diabetes mellitus with peripheral neuropathy (HCC)   Anxiety and depression   Lumbar radiculopathy   Intervertebral lumbar disc disorder with myelopathy, lumbar region   Parkinsonism (HCC)   Intractable back pain   Impaired ambulation   Hospital Course: Taken from H&P.  Thomas Cole is a 53 y.o. male with medical history significant for anxiety, depression, asthma, type 2 diabetes mellitus, GERD, Parkinsonism, hypertension, migraine and seizure disorder, who presented to the emergency room with acute onset of intractable low back pain.  He admits to right hip pain with decreased range of motion due to pain.  The patient underwent L4-L5 microdiscectomy on 04/14/2023.   On presentation stable vital and labs. Lumbar spine MRI without contrast revealed the following: 1. Status post right L4-5 laminectomy with unchanged severe spinal canal and bilateral neural foraminal stenosis. 2. Unchanged severe spinal canal stenosis at L2-L3 and L3-L4. 3. Unchanged moderate left L5-S1 neural foraminal stenosis.  Patient was admitted for pain control and neurosurgery was consulted.  2/23: Vital stable except borderline tachycardia,MRI of right hip was negative for any acute abnormality, did show some right sided posterior paraspinal intramuscular edema and degenerative changes.  Neurosurgery is recommending high-dose steroid and if continue to have significant pain then they might take him  back to the OR for further decompression.  2/24:Vital Stable, pain little better with steroid.  2/25: Vital stable, going for lumbar laminectomy from L2-L5 to decompress the stenosis today.  2/26: Vital stable, s/p Lumbar laminectomy medial facetectomy lateral recess decompression from L2-L5 with neurosurgery yesterday, labs with leukocytosis today at 13.9 likely reactive after the surgery, hemoglobin decreased to 12.9 from 14, likely will need to stay 2-3 more days per neurosurgery.  2/28: Patient remained hemodynamically stable.  Drain was removed by neurosurgery today and they cleared him for discharge.  Able to ambulate with PT, continue to have some pain but stating that it is improving.  Patient has pain medications already on board which she will continue.  Flexeril was provided to use as needed for muscle relaxation.  Patient will continue on current medications and need to have a close follow-up with his providers for further management.  Assessment and Plan: * Intractable low back pain  lumbar MRI with severe spinal canal and bilateral neuronal foraminal stenosis.  S/p surgical decompression on 2/25 Right hip MRI was negative for any acute significant abnormality Drain was removed on 2/28 by neurosurgery and patient was cleared for discharge. -Continue with pain management  Essential hypertension - We will continue antihypertensive therapy.  Type 2 diabetes mellitus with peripheral neuropathy (HCC) Patient was on metformin at home. -Started on moderate SSI-might need more insulin as he is getting steroid. - We will continue Lyrica - We will hold off metformin.  Anxiety and depression - We will continue Klonopin and Cymbalta.  Parkinsonism (HCC) - We will continue Sinemet.   Pain control - [employer] Controlled Substance Reporting System database was reviewed. and patient was instructed, not to drive, operate  heavy machinery, perform activities at heights, swimming  or participation in water activities or provide baby-sitting services while on Pain, Sleep and Anxiety Medications; until their outpatient Physician has advised to do so again. Also recommended to not to take more than prescribed Pain, Sleep and Anxiety Medications.  Consultants: Neurosurgery Procedures performed:  L2-5 decompression  Disposition: Home Diet recommendation:  Discharge Diet Orders (From admission, onward)     Start     Ordered   05/31/23 0000  Diet - low sodium heart healthy        05/31/23 1248           Cardiac and Carb modified diet DISCHARGE MEDICATION: Allergies as of 05/31/2023       Reactions   Amitriptyline Other (See Comments)   Felt really bad- psychologically   Bee Venom Itching, Swelling   Chlorhexidine Itching   REACTION TO WIPES/ CLOTHES ONLY==he can tolerate the SCRUB LIQUID   Carbamazepine Itching, Rash   Hm Lidocaine Patch [lidocaine] Hives, Rash   Meloxicam Itching, Rash   Morphine And Codeine Anxiety   Makes him restless  Makes him restless    Sulfa Antibiotics Itching, Rash, Other (See Comments)        Medication List     STOP taking these medications    tiZANidine 4 MG tablet Commonly known as: ZANAFLEX       TAKE these medications    carbidopa-levodopa 50-200 MG tablet Commonly known as: SINEMET CR Take 1 tablet by mouth at bedtime.   carbidopa-levodopa 25-100 MG tablet Commonly known as: SINEMET IR TAKE 2 TABLETS BY MOUTH 3 (THREE) TIMES DAILY   clonazePAM 1 MG tablet Commonly known as: KLONOPIN Take 1 mg by mouth at bedtime as needed.   cyanocobalamin 1000 MCG tablet Commonly known as: VITAMIN B12 Take 1,000 mcg by mouth daily.   cyclobenzaprine 10 MG tablet Commonly known as: FLEXERIL Take 1 tablet (10 mg total) by mouth 3 (three) times daily as needed for muscle spasms.   DULoxetine 60 MG capsule Commonly known as: Cymbalta Take 1 capsule (60 mg total) by mouth daily after breakfast.   furosemide 20 MG  tablet Commonly known as: LASIX Take 20 mg by mouth daily.   hydrochlorothiazide 25 MG tablet Commonly known as: HYDRODIURIL Take 25 mg by mouth daily.   HYDROcodone-acetaminophen 10-325 MG tablet Commonly known as: NORCO Take 1 tablet by mouth every 8 (eight) hours as needed. For chronic pain syndrome. Each Rx to last 30 days.   HYDROcodone-acetaminophen 10-325 MG tablet Commonly known as: NORCO Take 1 tablet by mouth every 8 (eight) hours as needed. For chronic pain syndrome. Each Rx to last 30 days. Start taking on: June 14, 2023   HYDROcodone-acetaminophen 10-325 MG tablet Commonly known as: NORCO Take 1 tablet by mouth every 8 (eight) hours as needed. For chronic pain syndrome. Each Rx to last 30 days. Start taking on: July 14, 2023   lamoTRIgine 25 MG tablet Commonly known as: LAMICTAL Take 75 mg by mouth 2 (two) times daily. Titration schedule   losartan 100 MG tablet Commonly known as: COZAAR Take 100 mg by mouth daily.   metFORMIN 500 MG tablet Commonly known as: GLUCOPHAGE Take 500 mg by mouth daily after lunch.   metoprolol succinate 25 MG 24 hr tablet Commonly known as: TOPROL-XL Take 12.5 mg by mouth daily.   nortriptyline 25 MG capsule Commonly known as: PAMELOR Take 2 capsules (50 mg total) by mouth at bedtime.   omeprazole 40 MG  capsule Commonly known as: PRILOSEC Take 1 capsule by mouth 2 (two) times daily.   pregabalin 100 MG capsule Commonly known as: Lyrica Take 1 capsule (100 mg total) by mouth 3 (three) times daily.   sildenafil 100 MG tablet Commonly known as: VIAGRA 1/2 to 1 tab daily as needed prior to sex   Vitamin D3 25 MCG (1000 UT) Caps Take 1 capsule by mouth daily.        Follow-up Information     Lovenia Kim, MD Follow up on 06/10/2023.   Specialty: Neurosurgery Contact information: [address] [place] [address] [phone]         Kandyce Rud, MD. Schedule an appointment as soon as  possible for a visit in 1 week(s).   Specialty: Family Medicine Contact information: [address] [facility] [place] [address] [phone]                Discharge Exam: Ceasar Mons Weights   05/25/23 2151 05/28/23 1600  Weight: 136 kg (!) 140.6 kg   General.  Obese gentleman, in no acute distress. Pulmonary.  Lungs clear bilaterally, normal respiratory effort. CV.  Regular rate and rhythm, no JVD, rub or murmur. Abdomen.  Soft, nontender, nondistended, BS positive. CNS.  Alert and oriented .  No focal neurologic deficit. Extremities.  No edema, no cyanosis, pulses intact and symmetrical. Psychiatry.  Judgment and insight appears normal.   Condition at discharge: stable  The results of significant diagnostics from this hospitalization (including imaging, microbiology, ancillary and laboratory) are listed below for reference.   Imaging Studies: DG Lumbar Spine 2-3 Views Result Date: 05/29/2023 CLINICAL DATA:  Intraoperative localization EXAM: LUMBAR SPINE - 2-3 VIEW COMPARISON:  05/23/2023 FINDINGS: 1st lateral intraoperative image demonstrates posterior instrument directed at the L3-4 level. Second lateral intraoperative image demonstrates a superior surgical instrument at the L2-3 level and inferior instrument at the L5 level. IMPRESSION: Intraoperative localization as above Electronically Signed   By: Charlett Nose M.D.   On: 05/29/2023 01:11   DG C-Arm 1-60 Min-No Report Result Date: 05/28/2023 Fluoroscopy was utilized by the requesting physician.  No radiographic interpretation.   DG C-Arm 1-60 Min-No Report Result Date: 05/28/2023 Fluoroscopy was utilized by the requesting physician.  No radiographic interpretation.   MR HIP RIGHT WO CONTRAST Result Date: 05/26/2023 CLINICAL DATA:  Right hip pain.  No known injury EXAM: MR OF THE RIGHT HIP WITHOUT CONTRAST TECHNIQUE: Multiplanar, multisequence MR imaging was performed. No  intravenous contrast was administered. COMPARISON:  X-ray 10/26/2020 FINDINGS: Bones: No acute fracture. No dislocation. No femoral head avascular necrosis. Bony pelvis intact without diastasis. SI joints and pubic symphysis within normal limits. No bone marrow edema of the pelvis or hips. Degenerative lower lumbar spondylosis is partially imaged and was more fully assessed on recent dedicated lumbar spine MRI. No marrow replacing bone lesion. Articular cartilage and labrum Articular cartilage: Mild cartilage thinning and surface irregularity along the superior aspect of the right hip joint. Similar findings at the contralateral left hip. Labrum:  Superior labral degeneration.  No paralabral cyst. Joint or bursal effusion Joint effusion:  Small bilateral hip joint effusions. Bursae: No abnormal bursal fluid collection. Muscles and tendons Muscles and tendons: Mild tendinosis of the right hamstring tendon origin. The gluteal, iliopsoas, rectus femoris, and adductor tendons appear intact without tear or significant tendinosis. Right-sided posterior paraspinal intramuscular edema. Normal hip and pelvic muscle bulk and signal intensity without edema,  atrophy, or fatty infiltration. Other findings Miscellaneous: No soft tissue edema or fluid collection. No inguinal lymphadenopathy. IMPRESSION: 1. No acute osseous abnormality of the pelvis or right hip. Mild osteoarthritis of the bilateral hips. 2. Small bilateral hip joint effusions, favored reactive/degenerative. 3. Mild tendinosis of the right hamstring tendon origin. 4. Right-sided posterior paraspinal intramuscular edema, which may be related to recent surgery or reflect muscle strain. Electronically Signed   By: Duanne Guess D.O.   On: 05/26/2023 12:23   DG Lumbar Spine Complete Result Date: 05/26/2023 CLINICAL DATA:  Back pain.  Lumbar radiculopathy. EXAM: LUMBAR SPINE - COMPLETE 4+ VIEW COMPARISON:  None Available. FINDINGS: No acute abnormality. Mild  multilevel height loss. 5 lumbar vertebral bodies. Normal alignment. IMPRESSION: Mild multilevel height loss. Electronically Signed   By: Deatra Robinson M.D.   On: 05/26/2023 00:16   MR LUMBAR SPINE WO CONTRAST Result Date: 05/26/2023 CLINICAL DATA:  Low back pain with recent laminectomy EXAM: MRI LUMBAR SPINE WITHOUT CONTRAST TECHNIQUE: Multiplanar, multisequence MR imaging of the lumbar spine was performed. No intravenous contrast was administered. COMPARISON:  04/12/2023 and 05/23/2023 FINDINGS: Segmentation:  Standard. Alignment:  Physiologic. Vertebrae: Status post right L4-5 laminectomy. No acute fracture or discitis-osteomyelitis. Conus medullaris and cauda equina: Conus extends to the L1 level. Conus and cauda equina appear normal. Paraspinal and other soft tissues: Negative. Disc levels: L1-L2: Normal disc space and facet joints. No spinal canal stenosis. No neural foraminal stenosis. L2-L3: Intermediate sized disc bulge. Unchanged severe spinal canal stenosis. No neural foraminal stenosis. L3-L4: Intermediate sized disc bulge. Unchanged severe spinal canal stenosis. No neural foraminal stenosis. L4-L5: Status post right laminectomy. Intermediate sized disc bulge. Severe spinal canal stenosis. Severe bilateral neural foraminal stenosis. L5-S1: Small disc bulge with superimposed central protrusion and mild facet hypertrophy. Left lateral recess narrowing without central spinal canal stenosis. Mild right and moderate left neural foraminal stenosis. Visualized sacrum: Normal. IMPRESSION: 1. Status post right L4-5 laminectomy with unchanged severe spinal canal and bilateral neural foraminal stenosis. 2. Unchanged severe spinal canal stenosis at L2-L3 and L3-L4. 3. Unchanged moderate left L5-S1 neural foraminal stenosis. Electronically Signed   By: Deatra Robinson M.D.   On: 05/26/2023 00:15    Microbiology: Results for orders placed or performed during the hospital encounter of 10/21/19  SARS CORONAVIRUS 2  (TAT 6-24 HRS) Nasopharyngeal Nasopharyngeal Swab     Status: None   Collection Time: 10/21/19 11:30 AM   Specimen: Nasopharyngeal Swab  Result Value Ref Range Status   SARS Coronavirus 2 NEGATIVE NEGATIVE Final    Comment: (NOTE) SARS-CoV-2 target nucleic acids are NOT DETECTED.  The SARS-CoV-2 RNA is generally detectable in upper and lower respiratory specimens during the acute phase of infection. Negative results do not preclude SARS-CoV-2 infection, do not rule out co-infections with other pathogens, and should not be used as the sole basis for treatment or other patient management decisions. Negative results must be combined with clinical observations, patient history, and epidemiological information. The expected result is Negative.  Fact Sheet for Patients: HairSlick.no  Fact Sheet for Healthcare Providers: quierodirigir.com  This test is not yet approved or cleared by the [place] FDA and  has been authorized for detection and/or diagnosis of SARS-CoV-2 by FDA under an Emergency Use Authorization (EUA). This EUA will remain  in effect (meaning this test can be used) for the duration of the COVID-19 declaration under Se ction 564(b)(1) of the Act, 21 U.S.C. section 360bbb-3(b)(1), unless the authorization is terminated or revoked sooner.  Performed at [facility], [address], [place], [place] [address]     Labs: CBC: Recent Labs  Lab 05/26/23 0010 05/26/23 0509 05/29/23 0539 05/31/23 0447  WBC 8.2 8.4 13.9* 11.0*  NEUTROABS 3.6  --   --   --   HGB 14.0 14.0 12.9* 13.3  HCT 40.3 39.7 37.8* 38.5*  MCV 89.6 88.0 89.2 88.9  PLT 173 174 205 184   Basic Metabolic Panel: Recent Labs  Lab 05/26/23 0010 05/26/23 0509 05/29/23 0539 05/31/23 0447  NA 136 136 135 133*  K 3.8 3.9 3.9 3.3*  CL 100 101 100 99  CO2 25 25 25 26   GLUCOSE 104* 92 167* 119*  BUN 21* 26* 35* 23*  CREATININE 0.90  0.86 1.10 0.94  CALCIUM 9.6 9.4 8.7* 8.9  MG  --   --   --  2.0   Liver Function Tests: No results for input(s): "AST", "ALT", "ALKPHOS", "BILITOT", "PROT", "ALBUMIN" in the last 168 hours. CBG: Recent Labs  Lab 05/30/23 1152 05/30/23 1628 05/30/23 2139 05/31/23 0752 05/31/23 1129  GLUCAP 118* 111* 122* 118* 106*    Discharge time spent: greater than 30 minutes.  This record has been created using Conservation officer, historic buildings. Errors have been sought and corrected,but may not always be located. Such creation errors do not reflect on the standard of care.   Signed: Arnetha Courser, MD [facility] 05/31/2023

## 2023-05-31 NOTE — Plan of Care (Signed)

## 2023-05-31 NOTE — Progress Notes (Signed)
   Neurosurgery Progress Note  History: Thomas Cole is s/p L2-5 decompression   POD3: pt with expected back soreness overnight   POD2: Pt doing well this morning.  He states that he walked better yesterday than he has in a long time. POD1: Patient feels as though his sensation in his legs is improved this morning.  He had some trouble overnight with pain control.  Physical Exam: Vitals:   05/30/23 2305 05/31/23 0750  BP: 100/65 120/76  Pulse: 88 77  Resp: 16 16  Temp: 98.1 F (36.7 C) (!) 97.5 F (36.4 C)  SpO2: 90% 95%    AA Ox3 CNI  Strength:5/5 throughout BLE Drain output 30 yesterday  Data:  Other tests/results: NA  Assessment/Plan:  Thomas Cole is a 53 year old with a history of right L4-5 discectomy with interval disc herniation and severe stenosis status post L2-5 decompression.  - mobilize - pain control; would recommend continuing current regimen at discharge - DVT prophylaxis - Will remove drain this morning - PTOT - will plan for outpatient follow up as scheduled.   Manning Charity PA-C Department of Neurosurgery

## 2023-05-31 NOTE — Progress Notes (Signed)
 Physical Therapy Treatment Patient Details Name: Thomas Cole MRN: [id number] DOB: Oct 07, 1970 Today's Date: 05/31/2023   History of Present Illness Pt is a 53 yo male  s/p Lumbar laminectomy L2-L5. PMH of  anxiety, depression, asthma, type 2 diabetes mellitus, GERD, Parkinsonism, hypertension, migraine and seizure disorder, previous back surgeries as recent as 04/14/2023.    PT Comments  Patient alert, agreeable to PT, reported 5/10 low back pain. He was able to perform log roll in and out of bed, supervision, extra time and reliance on bed rails. Pt remained supervision for transfers with RW. He ambulated ~24ft, no LOB but decreased velocity and pt did report increased fatigue from baseline. Declined stair navigation today (has previously done them with PT, safe and able to verbalize technique). Pt/PT also reviewed some seated BLE exercises. Pt back in bed with needs in reach at end of session. The patient would benefit from further skilled PT intervention to continue to progress towards goals.     If plan is discharge home, recommend the following: Assistance with cooking/housework;Assist for transportation;Help with stairs or ramp for entrance   Can travel by private vehicle        Equipment Recommendations  None recommended by PT    Recommendations for Other Services       Precautions / Restrictions Precautions Precautions: Fall;Back Recall of Precautions/Restrictions: Intact Restrictions Weight Bearing Restrictions Per Provider Order: No     Mobility  Bed Mobility Overal bed mobility: Needs Assistance Bed Mobility: Sit to Sidelying, Rolling, Sidelying to Sit Rolling: Supervision, Used rails Sidelying to sit: Supervision, HOB elevated, Used rails     Sit to sidelying: Supervision, Used rails      Transfers Overall transfer level: Needs assistance Equipment used: Rolling Turnbo (2 wheels) Transfers: Sit to/from Stand Sit to Stand: Supervision                 Ambulation/Gait Ambulation/Gait assistance: Supervision Gait Distance (Feet): 210 Feet Assistive device: Rolling Eber (2 wheels)   Gait velocity: decr     General Gait Details: no LOB   Stairs             Wheelchair Mobility     Tilt Bed    Modified Rankin (Stroke Patients Only)       Balance Overall balance assessment: Needs assistance Sitting-balance support: Feet supported Sitting balance-[name] Scale: Good     Standing balance support: Bilateral upper extremity supported, During functional activity, Reliant on assistive device for balance Standing balance-[name] Scale: Good                              Communication    Cognition Arousal: Alert Behavior During Therapy: WFL for tasks assessed/performed   PT - Cognitive impairments: No apparent impairments                                Cueing    Exercises      General Comments        Pertinent Vitals/Pain Pain Assessment Pain Assessment: 0-10 Pain Score: 5  Pain Location: R sided pain Pain Descriptors / Indicators: Tender, Tightness, Sore Pain Intervention(s): Monitored during session, Repositioned, Limited activity within patient's tolerance    Home Living                          Prior Function  PT Goals (current goals can now be found in the care plan section) Progress towards PT goals: Progressing toward goals    Frequency    7X/week      PT Plan      Co-evaluation              AM-PAC PT "6 Clicks" Mobility   Outcome Measure  Help needed turning from your back to your side while in a flat bed without using bedrails?: A Little Help needed moving from lying on your back to sitting on the side of a flat bed without using bedrails?: A Little Help needed moving to and from a bed to a chair (including a wheelchair)?: None Help needed standing up from a chair using your arms (e.g., wheelchair or bedside chair)?: None Help  needed to walk in hospital room?: None Help needed climbing 3-5 steps with a railing? : A Little 6 Click Score: 21    End of Session   Activity Tolerance: Patient tolerated treatment well Patient left: in bed;with call bell/phone within reach;with bed alarm set Nurse Communication: Mobility status PT Visit Diagnosis: Other abnormalities of gait and mobility (R26.89);Difficulty in walking, not elsewhere classified (R26.2);Muscle weakness (generalized) (M62.81);Pain Pain - Right/Left:  (drain site) Pain - part of body:  (Back)     Time: 4401-0272 PT Time Calculation (min) (ACUTE ONLY): 12 min  Charges:    $Therapeutic Activity: 8-22 mins PT General Charges $$ ACUTE PT VISIT: 1 Visit                     Olga Coaster PT, DPT 9:30 AM,05/31/23

## 2023-05-31 NOTE — Progress Notes (Signed)
 Occupational Therapy Treatment Patient Details Name: Thomas Cole MRN: [id number] DOB: [birth year] Today's Date: 05/31/2023   History of present illness Pt is a 53 yo male  s/p Lumbar laminectomy L2-L5. PMH of  anxiety, depression, asthma, type 2 diabetes mellitus, GERD, Parkinsonism, hypertension, migraine and seizure disorder, previous back surgeries as recent as 04/14/2023.   OT comments  Pt is supine in bed on arrival. Pleasant and agreeable to OT session. He reports minimal pain and pleased the drain is out. Pt performed bed mobility with MOD I to SUP with log roll technique. Pt required SUP for STS from EOB and in room mobility using RW to bathroom and back. SUP for all ADLs including toilet transfer, UB bathing/dressing and LB bathing in sitting/standing with pt following back precautions.  Pt left seated in recliner with all needs in place and will cont to require skilled acute OT services to maximize his safety and IND to return to PLOF.       If plan is discharge home, recommend the following:  A little help with walking and/or transfers;A little help with bathing/dressing/bathroom;Help with stairs or ramp for entrance;Assist for transportation   Equipment Recommendations  Other (comment) (long handled equipment)    Recommendations for Other Services      Precautions / Restrictions Precautions Precautions: Fall;Back Recall of Precautions/Restrictions: Intact Restrictions Weight Bearing Restrictions Per Provider Order: No       Mobility Bed Mobility Overal bed mobility: Needs Assistance Bed Mobility: Sit to Sidelying, Rolling, Sidelying to Sit Rolling: Supervision, Used rails Sidelying to sit: Supervision, HOB elevated, Used rails       General bed mobility comments: cueing for log roll technique from semi-supine    Transfers Overall transfer level: Needs assistance Equipment used: Rolling [place] (2 wheels) Transfers: Sit to/from Stand Sit to Stand: Supervision            General transfer comment: SUP for transfers and mobility with RW     Balance Overall balance assessment: Needs assistance Sitting-balance support: Feet supported Sitting balance-[name] Scale: Good     Standing balance support: Bilateral upper extremity supported, During functional activity, Reliant on assistive device for balance Standing balance-[name] Scale: Good Standing balance comment: SUP with RW use                           ADL either performed or assessed with clinical judgement   ADL Overall ADL's : Needs assistance/impaired     Grooming: Oral care;Wash/dry hands;Wash/dry face;Set up;Standing;Sitting   Upper Body Bathing: Supervision/ safety;Sitting   Lower Body Bathing: Supervison/ safety;Sitting/lateral leans;Sit to/from stand   Upper Body Dressing : Sitting;Set up       Toilet Transfer: Supervision/safety;Rolling [place] (2 wheels)                  Extremity/Trunk Assessment              Vision       Perception     Praxis     Communication     Cognition Arousal: Alert Behavior During Therapy: [facility] for tasks assessed/performed                                          Cueing      Exercises      Shoulder Instructions       General Comments  Pertinent Vitals/ Pain       Pain Assessment Pain Assessment: 0-10 Pain Score: 5  Pain Descriptors / Indicators: Tender, Tightness, Sore Pain Intervention(s): Monitored during session, Repositioned  Home Living                                          Prior Functioning/Environment              Frequency  Min 1X/week        Progress Toward Goals  OT Goals(current goals can now be found in the care plan section)  Progress towards OT goals: Progressing toward goals  Acute Rehab OT Goals Patient Stated Goal: return home OT Goal Formulation: With patient Time For Goal Achievement: 06/12/23 Potential to Achieve  Goals: Good  Plan      Co-evaluation                 AM-PAC OT "6 Clicks" Daily Activity     Outcome Measure   Help from another person eating meals?: None Help from another person taking care of personal grooming?: None Help from another person toileting, which includes using toliet, bedpan, or urinal?: A Little Help from another person bathing (including washing, rinsing, drying)?: A Little Help from another person to put on and taking off regular upper body clothing?: None Help from another person to put on and taking off regular lower body clothing?: A Little 6 Click Score: 21    End of Session Equipment Utilized During Treatment: Rolling [place] (2 wheels)  OT Visit Diagnosis: Other abnormalities of gait and mobility (R26.89);Muscle weakness (generalized) (M62.81);Pain   Activity Tolerance Patient tolerated treatment well   Patient Left in chair;with call bell/phone within reach   Nurse Communication Mobility status        Time: 1051-1110 OT Time Calculation (min): 19 min  Charges: OT General Charges $OT Visit: 1 Visit OT Treatments $Self Care/Home Management : 8-22 mins  Joey Hudock, OTR/L  05/31/23, 12:15 PM   Macedonio Scallon E Tremar Wickens 05/31/2023, 12:13 PM

## 2023-06-05 NOTE — Anesthesia Postprocedure Evaluation (Signed)
 Anesthesia Post Note  Patient: [name]  Procedure(s) Performed: DECOMPRESSIVE LUMBAR LAMINECTOMY L2-5 (Bilateral: Spine Lumbar)  Patient location during evaluation: PACU Anesthesia Type: General Level of consciousness: awake and alert Pain management: pain level controlled Vital Signs Assessment: post-procedure vital signs reviewed and stable Respiratory status: spontaneous breathing, nonlabored ventilation, respiratory function stable and patient connected to nasal cannula oxygen Cardiovascular status: blood pressure returned to baseline and stable Postop Assessment: no apparent nausea or vomiting Anesthetic complications: no   No notable events documented.   Last Vitals:  Vitals:   05/30/23 2305 05/31/23 0750  BP: 100/65 120/76  Pulse: 88 77  Resp: 16 16  Temp: 36.7 C (!) 36.4 C  SpO2: 90% 95%    Last Pain:  Vitals:   05/31/23 1255  TempSrc:   PainSc: 8                  Thomas Cole

## 2023-06-10 ENCOUNTER — Encounter: Payer: Self-pay | Admitting: Neurosurgery

## 2023-06-10 ENCOUNTER — Encounter: Payer: Medicare Other | Admitting: Physician Assistant

## 2023-06-10 ENCOUNTER — Ambulatory Visit (INDEPENDENT_AMBULATORY_CARE_PROVIDER_SITE_OTHER): Payer: Medicare Other | Admitting: Neurosurgery

## 2023-06-10 VITALS — BP 112/72 | Temp 98.4°F | Ht 75.0 in | Wt 310.0 lb

## 2023-06-10 DIAGNOSIS — M5416 Radiculopathy, lumbar region: Secondary | ICD-10-CM

## 2023-06-10 DIAGNOSIS — Z09 Encounter for follow-up examination after completed treatment for conditions other than malignant neoplasm: Secondary | ICD-10-CM

## 2023-06-10 DIAGNOSIS — R262 Difficulty in walking, not elsewhere classified: Secondary | ICD-10-CM

## 2023-06-10 DIAGNOSIS — M48061 Spinal stenosis, lumbar region without neurogenic claudication: Secondary | ICD-10-CM

## 2023-06-10 NOTE — Progress Notes (Signed)
   DOS: 04/14/23   HISTORY OF PRESENT ILLNESS: 05/23/2023 [name] is status post lumbar discectomy.  He initially did well and then had a herniation episode and was admitted to the hospital for decompression.  He is currently following up from that appointment.  Overall he does feel like he is walking better and that his strength is slowly improving.  He did have some swelling sensation or tightness sensation in the right lower back but has not not had any wound healing issues.  No new bowel or bladder issues.  Does get some intermittent cramping in his thighs  PHYSICAL EXAMINATION:   Vitals:   06/10/23 1318  BP: 112/72  Temp: 98.4 F (36.9 C)   General: Patient is well developed, well nourished, calm, collected, and in no apparent distress.  NEUROLOGICAL:  General: In no acute distress.  Awake, alert, oriented to person, place, and time. Pupils equal round and reactive to light.   Strength: Good activation of his bilateral lower extremities proximally and distally.  Incision c/d/i   ROS (Neurologic): Negative except as noted above  IMAGING: No new imaging  ASSESSMENT/PLAN:  Thomas Cole is here today for evaluation after a lumbar laminectomy.  He originally had a lumbar discectomy and was doing well but that of reherniation syndrome was admitted to the hospital with worsening functional status.  Given his severe inability to ambulate secondary to pain and claudicatory symptoms we took him to the OR for a midline decompression of his lumbar spine.  Overall he is doing well.  He has had an improvement in his ambulatory status since immediately preop.  He does feel extended back pain as expected given the larger incision.  He has felt some intermittent cramping in his bilateral lower extremities especially in the thighs.  At this point it sounds like reinnervation type symptoms.  Overall he is doing well for being only 2 weeks out from surgery.  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  Will see how he is doing in approximately 6 weeks and whether or not we will start formal physical therapy or if he has evaluated to be ready for outpatient physical therapy with his home health team.  Lovenia Kim, MD/MS Department of Neurosurgery

## 2023-06-26 ENCOUNTER — Encounter: Payer: Medicare Other | Admitting: Neurosurgery

## 2023-07-03 ENCOUNTER — Encounter: Payer: Medicare Other | Admitting: Physician Assistant

## 2023-07-08 ENCOUNTER — Emergency Department
Admission: EM | Admit: 2023-07-08 | Discharge: 2023-07-08 | Disposition: A | Discharge status: Home / Self Care | Attending: Emergency Medicine | Admitting: Emergency Medicine

## 2023-07-08 ENCOUNTER — Encounter: Payer: Medicare Other | Admitting: Neurosurgery

## 2023-07-08 ENCOUNTER — Emergency Department

## 2023-07-08 ENCOUNTER — Other Ambulatory Visit: Payer: Self-pay

## 2023-07-08 DIAGNOSIS — R531 Weakness: Secondary | ICD-10-CM | POA: Diagnosis present

## 2023-07-08 DIAGNOSIS — E1165 Type 2 diabetes mellitus with hyperglycemia: Secondary | ICD-10-CM | POA: Diagnosis not present

## 2023-07-08 DIAGNOSIS — R61 Generalized hyperhidrosis: Secondary | ICD-10-CM | POA: Insufficient documentation

## 2023-07-08 LAB — URINALYSIS, ROUTINE W REFLEX MICROSCOPIC
Bilirubin Urine: NEGATIVE
Glucose, UA: NEGATIVE mg/dL
Hgb urine dipstick: NEGATIVE
Ketones, ur: 5 mg/dL — AB
Leukocytes,Ua: NEGATIVE
Nitrite: NEGATIVE
Protein, ur: NEGATIVE mg/dL
Specific Gravity, Urine: 1.017 (ref 1.005–1.030)
pH: 5 (ref 5.0–8.0)

## 2023-07-08 LAB — BASIC METABOLIC PANEL WITH GFR
Anion gap: 10 (ref 5–15)
BUN: 16 mg/dL (ref 6–20)
CO2: 26 mmol/L (ref 22–32)
Calcium: 9.5 mg/dL (ref 8.9–10.3)
Chloride: 99 mmol/L (ref 98–111)
Creatinine, Ser: 1.13 mg/dL (ref 0.61–1.24)
GFR, Estimated: >60 mL/min (ref >60)
Glucose, Bld: 173 mg/dL — ABNORMAL HIGH (ref 70–99)
Potassium: 4.1 mmol/L (ref 3.5–5.1)
Sodium: 135 mmol/L (ref 135–145)

## 2023-07-08 LAB — CBC
HCT: 42.8 % (ref 39.0–52.0)
Hemoglobin: 14.1 g/dL (ref 13.0–17.0)
MCH: 30.1 pg (ref 26.0–34.0)
MCHC: 32.9 g/dL (ref 30.0–36.0)
MCV: 91.3 fL (ref 80.0–100.0)
Platelets: 184 10*3/uL (ref 150–400)
RBC: 4.69 MIL/uL (ref 4.22–5.81)
RDW: 13.2 % (ref 11.5–15.5)
WBC: 7.9 10*3/uL (ref 4.0–10.5)
nRBC: 0 % (ref 0.0–0.2)

## 2023-07-08 LAB — CBG MONITORING, ED: Glucose-Capillary: 165 mg/dL — ABNORMAL HIGH (ref 70–99)

## 2023-07-08 LAB — TROPONIN I (HIGH SENSITIVITY): Troponin I (High Sensitivity): 5 ng/L (ref <18)

## 2023-07-08 MED ORDER — IOHEXOL 350 MG/ML SOLN
75.0000 mL | Freq: Once | INTRAVENOUS | Status: AC | PRN
  Administered 2023-07-08: 75 mL via INTRAVENOUS

## 2023-07-08 MED ORDER — ONDANSETRON HCL 4 MG/2ML IJ SOLN
4.0000 mg | Freq: Once | INTRAMUSCULAR | Status: AC
  Administered 2023-07-08: 4 mg via INTRAVENOUS
  Filled 2023-07-08: qty 2

## 2023-07-08 NOTE — ED Triage Notes (Signed)
 Pt here with dizziness and swearing that occurred at 1300. Pt states he felt fine and then all of a sudden got diaphoretic and dizzy. Pt states he was slightly confused when it happened as well. Pt is chronically weaker on his left side but states he does feel more weak than normal. Pt is fatigued as well. Pt denies speech issues.

## 2023-07-08 NOTE — ED Provider Notes (Signed)
 [facility] Provider Note    Event Date/Time   First MD Initiated Contact with Patient 07/08/23 1654     (approximate)  History   Chief Complaint: Dizziness  HPI  Thomas Cole is a 53 y.o. male with a past medical history of anxiety, diabetes, gastric reflux, presents to the emergency department for acute onset of diaphoresis and dizziness.  According to the patient around 1 PM today he had acute onset of feeling somewhat dizzy/lightheaded and became mildly diaphoretic.  Patient states he has had similar symptoms in the past when his blood sugar gets low, he did not check his blood sugar but did drink some orange juice but states he did not feel much better.  Denies any chest pain denies any shortness of breath.  No weakness of any arm or leg.  Patient states he has had 2 recent back surgeries over the past 3 months.  Physical Exam   Triage Vital Signs: ED Triage Vitals  Encounter Vitals Group     BP 07/08/23 1501 116/75     Systolic BP Percentile --      Diastolic BP Percentile --      Pulse Rate 07/08/23 1501 93     Resp 07/08/23 1501 17     Temp 07/08/23 1509 97.6 F (36.4 C)     Temp Source 07/08/23 1509 Oral     SpO2 07/08/23 1501 99 %     Weight 07/08/23 1503 (!) 309 lb 15.5 oz (140.6 kg)     Height 07/08/23 1503 6\' 3"  (1.905 m)     Head Circumference --      Peak Flow --      Pain Score 07/08/23 1502 6     Pain Loc --      Pain Education --      Exclude from Growth Chart --     Most recent vital signs: Vitals:   07/08/23 1501 07/08/23 1509  BP: 116/75   Pulse: 93   Resp: 17   Temp:  97.6 F (36.4 C)  SpO2: 99%     General: Awake, no distress.  CV:  Good peripheral perfusion.  Regular rate and rhythm  Resp:  Normal effort.  Equal breath sounds bilaterally.  Abd:  No distention.  Soft, nontender.  No rebound or guarding.  ED Results / Procedures / Treatments   EKG  EKG viewed and interpreted by myself shows a normal sinus  rhythm at 86 bpm with a narrow QRS, normal axis, normal intervals, no concerning ST changes.  RADIOLOGY  I have reviewed and interpreted the CT head images.  No bleed seen on my evaluation. Radiologist read the CT scan as negative.   MEDICATIONS ORDERED IN ED: Medications  ondansetron (ZOFRAN) injection 4 mg (has no administration in time range)     IMPRESSION / MDM / ASSESSMENT AND PLAN / ED COURSE  I reviewed the triage vital signs and the nursing notes.  Patient's presentation is most consistent with acute presentation with potential threat to life or bodily function.  Patient presents emergency department for acute onset of sweating and feeling dizzy/lightheadedness.  Overall the patient appears well, reassuring physical exam, reassuring vital signs.  Patient's workup in the emergency department is reassuring as well, CT scan of the head is normal, patient's urinalysis is normal.  CBC shows no concerning findings normal white blood cell count.  Patient's chemistry shows mild hyperglycemia at 170 otherwise reassuring including normal renal function no signs of  dehydration.  However given the patient's acute onset of lightheadedness and diaphoresis we will obtain a CTA of the chest to rule out PE.  Will also obtain a troponin.  If those are both negative anticipate the patient could be discharged home as he is asymptomatic currently.  Patient is agreeable to this plan of care and workup.  Patient was a difficult IV, ultrasound IV team was unable to place an IV.  I placed an ultrasound-guided IV in the right forearm.  Will have CT get the patient shortly.  Troponin reassuringly negative.  CTA is negative for PE.  Possible cirrhosis.  We will discharge that the patient is now feeling better and workup is reassuring.  FINAL CLINICAL IMPRESSION(S) / ED DIAGNOSES   Weakness    Note:  This document was prepared using Dragon voice recognition software and may include unintentional dictation  errors.   Minna Antis, MD 07/08/23 [phone]

## 2023-07-15 ENCOUNTER — Ambulatory Visit: Admitting: Neurosurgery

## 2023-07-15 ENCOUNTER — Encounter: Payer: Self-pay | Admitting: Neurosurgery

## 2023-07-15 VITALS — BP 136/84 | Ht 75.0 in | Wt 309.0 lb

## 2023-07-15 DIAGNOSIS — M5416 Radiculopathy, lumbar region: Secondary | ICD-10-CM

## 2023-07-15 DIAGNOSIS — R262 Difficulty in walking, not elsewhere classified: Secondary | ICD-10-CM | POA: Diagnosis not present

## 2023-07-15 DIAGNOSIS — M48061 Spinal stenosis, lumbar region without neurogenic claudication: Secondary | ICD-10-CM

## 2023-07-15 DIAGNOSIS — Z09 Encounter for follow-up examination after completed treatment for conditions other than malignant neoplasm: Secondary | ICD-10-CM | POA: Diagnosis not present

## 2023-07-15 NOTE — Progress Notes (Signed)
   DOS: 04/14/23 And 05/28/23  HISTORY OF PRESENT ILLNESS: 07/15/23 [name] is status post lumbar discectomy.  He initially did well and then had a herniation episode and was admitted to the hospital for decompression.   Overall he is doing much better.  He feels like his walking capacity is improving.  Continues to have intermittent back pain.  When he overdoes it he has to ice himself but has been becoming less frequent and needing less recovery.  Not had any wound healing issues.  He did have a area where he felt like there was a small bump at the top of his incision that felt sharp.  PHYSICAL EXAMINATION:   Vitals:   07/15/23 1329  BP: 136/84   General: Patient is well developed, well nourished, calm, collected, and in no apparent distress.  NEUROLOGICAL:  General: In no acute distress.  Awake, alert, oriented to person, place, and time. Pupils equal round and reactive to light.  Strength: Good activation of his bilateral lower extremities proximally and distally.  Incision c/d/i   ROS (Neurologic): Negative except as noted above  IMAGING: No new imaging  ASSESSMENT/PLAN:  Thomas Cole is here today for evaluation after a lumbar laminectomy.  He originally had a lumbar discectomy and was doing well but that of reherniation syndrome was admitted to the hospital with worsening functional status.  Given his severe inability to ambulate secondary to pain and claudicatory symptoms we took him to the OR for a midline decompression of his lumbar spine.  Overall he is doing well.  He has had an improvement in his ambulatory status since immediately preop.  He continues to work with therapy.  Overall he feels like he is continuing to have improved ambulatory status.  His back pain is getting less frequent and less intense.  He feels like he is doing better than he was preoperatively.  Will continue to follow and we will see him for his next appointment.   Carroll Clamp,  MD/MS Department of Neurosurgery

## 2023-07-24 ENCOUNTER — Other Ambulatory Visit: Payer: Self-pay | Admitting: Student in an Organized Health Care Education/Training Program

## 2023-07-24 DIAGNOSIS — M5414 Radiculopathy, thoracic region: Secondary | ICD-10-CM

## 2023-07-24 DIAGNOSIS — M5412 Radiculopathy, cervical region: Secondary | ICD-10-CM

## 2023-07-24 DIAGNOSIS — G894 Chronic pain syndrome: Secondary | ICD-10-CM

## 2023-07-31 NOTE — Progress Notes (Signed)
 PROVIDER NOTE: Interpretation of information contained herein should be left to medically-trained personnel. Specific patient instructions are provided elsewhere under "Patient Instructions" section of medical record. This document was created in part using AI and STT-dictation technology, any transcriptional errors that may result from this process are unintentional.  Patient: Thomas Cole  Service: E/M   PCP: Nestor Banter, MD  DOB: 1970/12/23  DOS: 08/01/2023  Provider: Cherylin Corrigan, NP  MRN: [id number]  Delivery: Face-to-face  Specialty: Interventional Pain Management  Type: Established Patient  Setting: Ambulatory outpatient facility  Specialty designation: 09  Referring Prov.: Nestor Banter, MD  Location: Outpatient office facility       HPI  [name], a 53 y.o. year old male, is here today because of his History of lumbar surgery [Z98.890]. Mr. Gerecke primary complain today is Back Pain (Mid to lower back pain)  Pertinent problems: Mr. Maka does not have any pertinent problems on file. Pain Assessment: Severity of Chronic pain is reported as a 7 /10. Location: Back Mid, Lower/denies. Onset: More than a month ago. Quality: Sore (stiffness). Timing: Constant. Modifying factor(s): meds, ice. Vitals:  height is 6\' 3"  (1.905 m) and weight is 308 lb (139.7 kg) (abnormal). His temperature is 97.5 F (36.4 C) (abnormal). His blood pressure is 107/71 and his pulse is 98. His oxygen saturation is 100%.  BMI: Estimated body mass index is 38.5 kg/m as calculated from the following:   Height as of this encounter: 6\' 3"  (1.905 m).   Weight as of this encounter: 308 lb (139.7 kg). Last encounter: 05/09/2023 Last procedure: 02/13/2023  Reason for encounter: medication management. No change in medical history since last visit.  Patient's pain is at baseline.  Patient continues multimodal pain regimen as prescribed.  States that it provides pain relief and improvement in functional status.    The patient reports mid to lower back pain, which she attributes to recent lumbar discectomy surgeries performed within the past 3 months by neurosurgeon Dr. Henderson Lock on 05/28/2023.  He has a history of recurrent herniation episode following the surgery, for which she was admitted to the hospital for decompression.   Pharmacotherapy Assessment  Analgesic: Hydrocodone -acetaminophen  (Norco) 10-325 mg tablet every 8 hours as needed for pain. MME=30  Monitoring: [place] PMP: PDMP reviewed during this encounter.       Pharmacotherapy: No side-effects or adverse reactions reported. Compliance: No problems identified. Effectiveness: Clinically acceptable.  Merilyn Staple, RN  08/01/2023 11:04 AM  Sign when Signing Visit Safety precautions to be maintained throughout the outpatient stay will include: orient to surroundings, keep bed in low position, maintain call bell within reach at all times, provide assistance with transfer out of bed and ambulation.   Nursing Pain Medication Assessment:  Safety precautions to be maintained throughout the outpatient stay will include: orient to surroundings, keep bed in low position, maintain call bell within reach at all times, provide assistance with transfer out of bed and ambulation.  Medication Inspection Compliance: Mr. Claffey did not comply with our request to bring his pills to be counted. He was reminded that bringing the medication bottles, even when empty, is a requirement.  Medication: Hydrocodone /APAP Pill/Patch Count: No pills available to be counted. Pill/Patch Appearance: No markings Bottle Appearance: No container available. Did not bring bottle(s) to appointment. Filled Date: bottle left in car / bottle left in car / 2025 Last Medication intake:  Today    No results found for: "CBDTHCR" No results found for: "  D8THCCBX" No results found for: "D9THCCBX"  UDS:  Summary  Date Value Ref Range Status  10/17/2021 Note  Final    Comment:     ==================================================================== Compliance Drug Analysis, Ur ==================================================================== Test                             Result       Flag       Units  Drug Present and Declared for Prescription Verification   7-aminoclonazepam              96           EXPECTED   ng/mg creat    7-aminoclonazepam is an expected metabolite of clonazepam . Source of    clonazepam  is a scheduled prescription medication.    Hydrocodone                     758          EXPECTED   ng/mg creat   Hydromorphone                   87           EXPECTED   ng/mg creat   Dihydrocodeine                 263          EXPECTED   ng/mg creat   Norhydrocodone                 1508         EXPECTED   ng/mg creat    Sources of hydrocodone  include scheduled prescription medications.    Hydromorphone , dihydrocodeine and norhydrocodone are expected    metabolites of hydrocodone . Hydromorphone  and dihydrocodeine are    also available as scheduled prescription medications.    Pregabalin                      PRESENT      EXPECTED   Tizanidine                      PRESENT      EXPECTED   Duloxetine                      PRESENT      EXPECTED   Nortriptyline                   PRESENT      EXPECTED    Nortriptyline  may be administered as a prescription drug; it is also    an expected metabolite of amitriptyline.    Acetaminophen                   PRESENT      EXPECTED  Drug Absent but Declared for Prescription Verification   Phenytoin                       Not Detected UNEXPECTED   Metoprolol                      Not Detected UNEXPECTED ==================================================================== Test                      Result    Flag   Units      Ref Range   Creatinine  76               mg/dL      >=86 ==================================================================== Declared Medications:  The flagging and interpretation on this report  are based on the  following declared medications.  Unexpected results may arise from  inaccuracies in the declared medications.   **Note: The testing scope of this panel includes these medications:   Clonazepam  (Klonopin )  Duloxetine  (Cymbalta )  Hydrocodone   Hydrocodone  (Norco)  Metoprolol  (Toprol )  Nortriptyline  (Pamelor )  Phenytoin  (Dilantin )  Pregabalin  (Lyrica )   **Note: The testing scope of this panel does not include small to  moderate amounts of these reported medications:   Acetaminophen  (Norco)  Tizanidine  (Zanaflex )   **Note: The testing scope of this panel does not include the  following reported medications:   Carbidopa  (Sinemet )  Cyanocobalamin   Furosemide  (Lasix )  Glipizide  Hydrochlorothiazide  (Hydrodiuril )  Levodopa  (Sinemet )  Losartan  (Cozaar )  Metformin (Glucophage)  Omeprazole  (Prilosec)  Ubrogepant  (Ubrelvy )  Vitamin D3 ==================================================================== For clinical consultation, please call [phone]. ====================================================================       ROS  Constitutional: Denies any fever or chills Gastrointestinal: No reported hemesis, hematochezia, vomiting, or acute GI distress Musculoskeletal: low back pain (mid to lower back) Neurological: No reported episodes of acute onset apraxia, aphasia, dysarthria, agnosia, amnesia, paralysis, loss of coordination, or loss of consciousness  Medication Review  DULoxetine , HYDROcodone -acetaminophen , Vitamin D3, carbidopa -levodopa , clonazePAM , cyanocobalamin , furosemide , hydrochlorothiazide , lamoTRIgine , losartan , metFORMIN, metoprolol  succinate, nortriptyline , omeprazole , pregabalin , sildenafil, and tiZANidine   History Review  Allergy: Mr. Monigold is allergic to amitriptyline, bee venom, chlorhexidine, carbamazepine, hm lidocaine  patch [lidocaine ], meloxicam, morphine  and codeine, and sulfa antibiotics. Drug: Mr. Zeltner  reports no history  of drug use. Alcohol:  reports no history of alcohol use. Tobacco:  reports that he has never smoked. He has never used smokeless tobacco. Social: Mr. Thaker  reports that he has never smoked. He has never used smokeless tobacco. He reports that he does not drink alcohol and does not use drugs. Medical:  has a past medical history of Anxiety, Asthma, Back problem, Benign essential hypertension (11/30/2016), Cervical spondylosis without myelopathy (09/17/2012), Complication of anesthesia, Depression, Diabetes mellitus without complication (HCC) (02/2020), GERD (gastroesophageal reflux disease), Hypertension, Labile hypertension (03/19/2013), Major depressive disorder, recurrent episode, severe (HCC) (06/23/2015), Migraine headache, Migraines (11/11/2013), S/P appendectomy (04/09/2011), Seizure (HCC) (11/30/2016), Seizures (HCC), Shortness of breath, and SOB (shortness of breath) (08/06/2014). Surgical: Mr. Petrin  has a past surgical history that includes Knee surgery; Posterior fusion cervical spine; Cervical disc surgery; Appendectomy (12); Anterior cervical corpectomy (N/A, 09/15/2012); Cholecystectomy (N/A, 03/29/2017); Colonoscopy with propofol  (N/A, 10/23/2019); Esophagogastroduodenoscopy (egd) with propofol  (N/A, 10/23/2019); Lumbar laminectomy/decompression microdiscectomy (Right, 04/14/2023); and Decompressive lumbar laminectomy level 2 (Bilateral, 05/28/2023). Family: family history includes Arthritis in his father, maternal grandfather, maternal grandmother, mother, paternal grandfather, and paternal grandmother; Asthma in his mother; Coronary artery disease in his father and mother; Diabetes in his father, paternal grandfather, and paternal grandmother; Heart disease in his father, maternal grandfather, maternal grandmother, paternal grandfather, and paternal grandmother; Hypertension in his maternal grandfather, maternal grandmother, mother, paternal grandfather, and paternal grandmother; Mental illness in his  mother.  Laboratory Chemistry Profile   Renal Lab Results  Component Value Date   BUN 16 07/08/2023   CREATININE 1.13 07/08/2023   LABCREA 79.3 03/05/2014   GFR 82.19 08/05/2014   GFRAA >60 04/21/2017   GFRNONAA >60 07/08/2023    Hepatic Lab Results  Component Value Date   AST 30 04/21/2017   ALT 23 04/21/2017   ALBUMIN   4.2 04/21/2017   ALKPHOS 86 04/21/2017   LIPASE 51 04/21/2017    Electrolytes Lab Results  Component Value Date   NA 135 07/08/2023   K 4.1 07/08/2023   CL 99 07/08/2023   CALCIUM 9.5 07/08/2023   MG 2.0 05/31/2023    Bone Lab Results  Component Value Date   VD25OH 31.95 03/05/2016   25OHVITD1 35 04/26/2020   25OHVITD2 <1.0 04/26/2020   25OHVITD3 35 04/26/2020    Inflammation (CRP: Acute Phase) (ESR: Chronic Phase) Lab Results  Component Value Date   CRP 10 04/26/2020   ESRSEDRATE 22 04/26/2020         Note: Above Lab results reviewed.  Recent Imaging Review  CT Angio Chest PE W and/or Wo Contrast CLINICAL DATA:  Pulmonary embolism (PE) suspected, high prob Pt here with dizziness and swearing that occurred at 1300. Pt states he felt fine and then all of a sudden got diaphoretic and dizzy.  EXAM: CT ANGIOGRAPHY CHEST WITH CONTRAST  TECHNIQUE: Multidetector CT imaging of the chest was performed using the standard protocol during bolus administration of intravenous contrast. Multiplanar CT image reconstructions and MIPs were obtained to evaluate the vascular anatomy.  RADIATION DOSE REDUCTION: This exam was performed according to the departmental dose-optimization program which includes automated exposure control, adjustment of the mA and/or kV according to patient size and/or use of iterative reconstruction technique.  CONTRAST:  75mL OMNIPAQUE  IOHEXOL  350 MG/ML SOLN  COMPARISON:  CT abdomen pelvis 03/27/2017  FINDINGS: Cardiovascular: Fair opacification of the pulmonary arteries to the segmental level. No evidence of pulmonary  embolism. Limited evaluation of the subsegmental level due to timing of contrast. The main pulmonary artery measures at the upper limits of normal. Normal heart size. No significant pericardial effusion. The thoracic aorta is normal in caliber. At least 3 vessel coronary artery calcifications.  Mediastinum/Nodes: No enlarged mediastinal, hilar, or axillary lymph nodes. Thyroid  gland, trachea, and esophagus demonstrate no significant findings.  Lungs/Pleura: No focal consolidation. No pulmonary nodule. No pulmonary mass. No pleural effusion. No pneumothorax.  Upper Abdomen: Enlarged caudate lobe and nodular hepatic contour. Status post cholecystectomy. Colonic diverticulosis.  Musculoskeletal:  No chest wall abnormality.  Bilateral mild gynecomastia.  No suspicious lytic or blastic osseous lesions. No acute displaced fracture. Multilevel degenerative changes of the spine.  Review of the MIP images confirms the above findings.  IMPRESSION: 1. No pulmonary embolus. Limited evaluation of the subsegmental level. 2. Cirrhosis. No focal liver lesions identified. Please note that liver protocol enhanced MR and CT are the most sensitive tests for the screening detection of hepatocellular carcinoma in the high risk setting of cirrhosis. 3. At least 3 vessel coronary artery calcification.  Electronically Signed   By: Morgane  Naveau M.D.   On: 07/08/2023 22:30 CT HEAD WO CONTRAST CLINICAL DATA:  Syncope/presyncope, dizziness.  EXAM: CT HEAD WITHOUT CONTRAST  TECHNIQUE: Contiguous axial images were obtained from the base of the skull through the vertex without intravenous contrast.  RADIATION DOSE REDUCTION: This exam was performed according to the departmental dose-optimization program which includes automated exposure control, adjustment of the mA and/or kV according to patient size and/or use of iterative reconstruction technique.  COMPARISON:  CT head  02/04/2009  FINDINGS: Brain: No acute intracranial hemorrhage. No CT evidence of acute infarct. No edema, mass effect, or midline shift. The basilar cisterns are patent.  Ventricles: The ventricles are normal.  Vascular: No hyperdense vessel or unexpected calcification.  Skull: No acute or aggressive finding.  Orbits: Orbits  are symmetric.  Sinuses: Mild scattered mucosal thickening in the bilateral ethmoid sinuses. Mucous retention cysts in the sphenoid sinuses.  Other: Mastoid air cells are clear.  IMPRESSION: No CT evidence of acute intracranial abnormality.  Electronically Signed   By: Denny Flack M.D.   On: 07/08/2023 16:56 Note: Reviewed        Physical Exam  General appearance: Well nourished, well developed, and well hydrated. In no apparent acute distress Mental status: Alert, oriented x 3 (person, place, & time)       Respiratory: No evidence of acute respiratory distress Eyes: PERLA Vitals: BP 107/71   Pulse 98   Temp (!) 97.5 F (36.4 C)   Ht 6\' 3"  (1.905 m)   Wt (!) 308 lb (139.7 kg)   SpO2 100%   BMI 38.50 kg/m  BMI: Estimated body mass index is 38.5 kg/m as calculated from the following:   Height as of this encounter: 6\' 3"  (1.905 m).   Weight as of this encounter: 308 lb (139.7 kg). Ideal: Ideal body weight: 84.5 kg (186 lb 4.6 oz) Adjusted ideal body weight: 106.6 kg (234 lb 15.6 oz)  Assessment   Diagnosis Status  1. History of lumbar surgery (1994 L4/5 discetomy)   2. Thoracic radiculopathy   3. Chronic pain syndrome   4. Myelopathy of cervical spinal cord with cervical radiculopathy (HCC)   5. Lumbar radiculopathy (L>R)   6. Chronic radicular lumbar pain   7. Cervical radicular pain   8. Failed cervical fusion    Controlled Controlled Controlled   Updated Problems: Problem  Chronic Radicular Lumbar Pain   Plan of Care  Problem-specific:  Assessment and Plan We will continue on current medication regimen.  Prescribing drug  monitoring (PDMP) consistent with prescribed medication. Routine UDS ordered today.   The patient was unable to bring his pill bottle to verify remaining medications; therefore a 1 month prescription of Norco will be provided at this visit.   Mr. CUTLER PATRIARCA has a current medication list which includes the following long-term medication(s): carbidopa -levodopa , carbidopa -levodopa , clonazepam , duloxetine , lamotrigine , metformin, metoprolol  succinate, nortriptyline , pregabalin , sildenafil, and [START ON 08/14/2023] hydrocodone -acetaminophen .  Pharmacotherapy (Medications Ordered): Meds ordered this encounter  Medications   HYDROcodone -acetaminophen  (NORCO) 10-325 MG tablet    Sig: Take 1 tablet by mouth every 8 (eight) hours as needed. For chronic pain syndrome. Each Rx to last 30 days.    Dispense:  90 tablet    Refill:  0   Orders:  Orders Placed This Encounter  Procedures   ToxASSURE Select 13 (MW), Urine    Volume: 30 ml(s). Minimum 3 ml of urine is needed. Document temperature of fresh sample. Indications: Long term (current) use of opiate analgesic (Z61.096)    Release to patient:   Immediate   Follow-up plan:   Return in about 1 month (around 09/01/2023) for (F2F), (MM), Marthe Slain NP.        Recent Visits Date Type Provider Dept  05/09/23 Office Visit Cephus Collin, MD [facility]  Showing recent visits within past 90 days and meeting all other requirements Today's Visits Date Type Provider Dept  08/01/23 Office Visit Anahi Belmar K, NP [facility]  Showing today's visits and meeting all other requirements Future Appointments Date Type Provider Dept  08/28/23 Appointment Aneesh Faller K, NP [facility]  Showing future appointments within next 90 days and meeting all other requirements  I discussed the assessment and treatment plan with the patient. The patient  was provided an opportunity to ask questions and all were answered. The patient  agreed with the plan and demonstrated an understanding of the instructions.  Patient advised to call back or seek an in-person evaluation if the symptoms or condition worsens.  Duration of encounter: 30 minutes.  Total time on encounter, as per AMA guidelines included both the face-to-face and non-face-to-face time personally spent by the physician and/or other qualified health care professional(s) on the day of the encounter (includes time in activities that require the physician or other qualified health care professional and does not include time in activities normally performed by clinical staff). Physician's time may include the following activities when performed: Preparing to see the patient (e.g., pre-charting review of records, searching for previously ordered imaging, lab work, and nerve conduction tests) Review of prior analgesic pharmacotherapies. Reviewing PMP Interpreting ordered tests (e.g., lab work, imaging, nerve conduction tests) Performing post-procedure evaluations, including interpretation of diagnostic procedures Obtaining and/or reviewing separately obtained history Performing a medically appropriate examination and/or evaluation Counseling and educating the patient/family/caregiver Ordering medications, tests, or procedures Referring and communicating with other health care professionals (when not separately reported) Documenting clinical information in the electronic or other health record Independently interpreting results (not separately reported) and communicating results to the patient/ family/caregiver Care coordination (not separately reported)  Note by: Sabah Zucco K Cederic Mozley, NP (TTS and AI technology used. I apologize for any typographical errors that were not detected and corrected.) Date: 08/01/2023; Time: 11:44 AM

## 2023-08-01 ENCOUNTER — Encounter: Payer: Self-pay | Admitting: Nurse Practitioner

## 2023-08-01 ENCOUNTER — Ambulatory Visit
Payer: Medicare Other | Attending: Student in an Organized Health Care Education/Training Program | Admitting: Nurse Practitioner

## 2023-08-01 VITALS — BP 107/71 | HR 98 | Temp 97.5°F | Ht 75.0 in | Wt 308.0 lb

## 2023-08-01 DIAGNOSIS — M5416 Radiculopathy, lumbar region: Secondary | ICD-10-CM | POA: Insufficient documentation

## 2023-08-01 DIAGNOSIS — M5412 Radiculopathy, cervical region: Secondary | ICD-10-CM | POA: Diagnosis present

## 2023-08-01 DIAGNOSIS — G8929 Other chronic pain: Secondary | ICD-10-CM | POA: Insufficient documentation

## 2023-08-01 DIAGNOSIS — G959 Disease of spinal cord, unspecified: Secondary | ICD-10-CM | POA: Insufficient documentation

## 2023-08-01 DIAGNOSIS — Z9889 Other specified postprocedural states: Secondary | ICD-10-CM | POA: Insufficient documentation

## 2023-08-01 DIAGNOSIS — M5414 Radiculopathy, thoracic region: Secondary | ICD-10-CM | POA: Diagnosis present

## 2023-08-01 DIAGNOSIS — M960 Pseudarthrosis after fusion or arthrodesis: Secondary | ICD-10-CM | POA: Insufficient documentation

## 2023-08-01 DIAGNOSIS — G894 Chronic pain syndrome: Secondary | ICD-10-CM | POA: Insufficient documentation

## 2023-08-01 MED ORDER — HYDROCODONE-ACETAMINOPHEN 10-325 MG PO TABS: 1.0000 | ORAL_TABLET | Freq: Three times a day (TID) | ORAL | 0 refills | Status: DC | PRN

## 2023-08-01 NOTE — Progress Notes (Signed)
 Safety precautions to be maintained throughout the outpatient stay will include: orient to surroundings, keep bed in low position, maintain call bell within reach at all times, provide assistance with transfer out of bed and ambulation.   Nursing Pain Medication Assessment:  Safety precautions to be maintained throughout the outpatient stay will include: orient to surroundings, keep bed in low position, maintain call bell within reach at all times, provide assistance with transfer out of bed and ambulation.  Medication Inspection Compliance: Thomas Cole did not comply with our request to bring his pills to be counted. He was reminded that bringing the medication bottles, even when empty, is a requirement.  Medication: Hydrocodone /APAP Pill/Patch Count: No pills available to be counted. Pill/Patch Appearance: No markings Bottle Appearance: No container available. Did not bring bottle(s) to appointment. Filled Date: bottle left in car / bottle left in car / 2025 Last Medication intake:  Today

## 2023-08-05 LAB — TOXASSURE SELECT 13 (MW), URINE

## 2023-08-19 ENCOUNTER — Ambulatory Visit: Payer: Medicare Other | Admitting: Neurosurgery

## 2023-08-19 ENCOUNTER — Encounter: Payer: Self-pay | Admitting: Neurosurgery

## 2023-08-19 VITALS — BP 116/80 | Ht 75.0 in | Wt 308.0 lb

## 2023-08-19 DIAGNOSIS — M48062 Spinal stenosis, lumbar region with neurogenic claudication: Secondary | ICD-10-CM

## 2023-08-19 DIAGNOSIS — M48061 Spinal stenosis, lumbar region without neurogenic claudication: Secondary | ICD-10-CM

## 2023-08-19 DIAGNOSIS — Z09 Encounter for follow-up examination after completed treatment for conditions other than malignant neoplasm: Secondary | ICD-10-CM

## 2023-08-19 DIAGNOSIS — M5416 Radiculopathy, lumbar region: Secondary | ICD-10-CM

## 2023-08-19 NOTE — Progress Notes (Signed)
   DOS: 04/14/23 And 05/28/23  HISTORY OF PRESENT ILLNESS: 08/19/23  Mr. Terris Germano is status post lumbar discectomy with repeat decompression.  Overall he continues to improve with his gait and ambulation.  Feels like his back is getting better.  Has intermittent back pain but overall is doing better.  He unfortunately had a fall where he tripped over a item he had placed on the ground, here today for follow-up.  Thankfully he is doing better in a back standpoint but does have some pain and bruising on the right side.  He was evaluated by EMS.  PHYSICAL EXAMINATION:   Vitals:   08/19/23 1447  BP: 116/80   General: Patient is well developed, well nourished, calm, collected, and in no apparent distress.  NEUROLOGICAL:  General: In no acute distress.  Awake, alert, oriented to person, place, and time. Pupils equal round and reactive to light.  Strength: Good activation of his bilateral lower extremities proximally and distally.  Incision c/d/i   ROS (Neurologic): Negative except as noted above  IMAGING: No new imaging  ASSESSMENT/PLAN:  Zayn Selley is here today for evaluation after a lumbar laminectomy.  He originally had a lumbar discectomy and was doing well but that of reherniation syndrome was admitted to the hospital with worsening functional status.  Given his severe inability to ambulate secondary to pain and claudicatory symptoms we took him to the OR for a midline decompression of his lumbar spine.  Overall he is doing well.  He did have a recent fall where he was evaluated by EMS and is following up today.  He had tripped over an item that he had placed on the ground falling onto his right side having some significant bruising.  From a back standpoint however he does feel like he continues to improve.  His walking is getting better.  Overall he is recovering.  I would like to continue to follow him every 3 months to make sure he continues to improve.  He does have longstanding  cervical issues as well which we will likely begin to address once he has been more recovered from this last set of operations.  Carroll Clamp, MD/MS Department of Neurosurgery

## 2023-08-28 ENCOUNTER — Ambulatory Visit (HOSPITAL_BASED_OUTPATIENT_CLINIC_OR_DEPARTMENT_OTHER): Admitting: Nurse Practitioner

## 2023-08-28 DIAGNOSIS — G894 Chronic pain syndrome: Secondary | ICD-10-CM

## 2023-08-28 DIAGNOSIS — G959 Disease of spinal cord, unspecified: Secondary | ICD-10-CM

## 2023-08-28 DIAGNOSIS — Z91199 Patient's noncompliance with other medical treatment and regimen due to unspecified reason: Secondary | ICD-10-CM

## 2023-08-28 DIAGNOSIS — M5414 Radiculopathy, thoracic region: Secondary | ICD-10-CM

## 2023-08-28 NOTE — Progress Notes (Signed)
 08/28/2023- No show

## 2023-09-03 ENCOUNTER — Ambulatory Visit: Attending: Nurse Practitioner | Admitting: Nurse Practitioner

## 2023-09-03 ENCOUNTER — Encounter: Payer: Self-pay | Admitting: Nurse Practitioner

## 2023-09-03 VITALS — BP 138/86 | HR 86 | Temp 98.1°F | Resp 18 | Ht 75.0 in | Wt 300.0 lb

## 2023-09-03 DIAGNOSIS — G894 Chronic pain syndrome: Secondary | ICD-10-CM | POA: Insufficient documentation

## 2023-09-03 DIAGNOSIS — M960 Pseudarthrosis after fusion or arthrodesis: Secondary | ICD-10-CM | POA: Insufficient documentation

## 2023-09-03 DIAGNOSIS — M5412 Radiculopathy, cervical region: Secondary | ICD-10-CM | POA: Diagnosis present

## 2023-09-03 DIAGNOSIS — Z79899 Other long term (current) drug therapy: Secondary | ICD-10-CM | POA: Diagnosis present

## 2023-09-03 DIAGNOSIS — M5414 Radiculopathy, thoracic region: Secondary | ICD-10-CM | POA: Diagnosis not present

## 2023-09-03 DIAGNOSIS — Z9889 Other specified postprocedural states: Secondary | ICD-10-CM | POA: Insufficient documentation

## 2023-09-03 DIAGNOSIS — G959 Disease of spinal cord, unspecified: Secondary | ICD-10-CM | POA: Insufficient documentation

## 2023-09-03 MED ORDER — HYDROCODONE-ACETAMINOPHEN 10-325 MG PO TABS: 1.0000 | ORAL_TABLET | Freq: Three times a day (TID) | ORAL | 0 refills | Status: DC | PRN

## 2023-09-03 MED ORDER — PREGABALIN 100 MG PO CAPS: 100.0000 mg | ORAL_CAPSULE | Freq: Three times a day (TID) | ORAL | 5 refills | Status: DC

## 2023-09-03 NOTE — Progress Notes (Signed)
 PROVIDER NOTE: Interpretation of information contained herein should be left to medically-trained personnel. Specific patient instructions are provided elsewhere under "Patient Instructions" section of medical record. This document was created in part using AI and STT-dictation technology, any transcriptional errors that may result from this process are unintentional.  Patient: Thomas Cole  Service: E/M   PCP: Nestor Banter, MD  DOB: 07-31-1970  DOS: 09/03/2023  Provider: Cherylin Corrigan, NP  MRN: [id number]  Delivery: Face-to-face  Specialty: Interventional Pain Management  Type: Established Patient  Setting: Ambulatory outpatient facility  Specialty designation: 09  Referring Prov.: Nestor Banter, MD  Location: Outpatient office facility       History of present illness (HPI) [name], a 53 y.o. year old male, is here today because of his Chronic pain syndrome [G89.4]. Mr. Bischoff primary complain today is Mid low back pain   Pain Assessment: Severity of   is reported as a  /10. Location:    / . Onset:  . Quality:  . Timing:  . Modifying factor(s):  Aaron Aas Vitals:  height is 6\' 3"  (1.905 m) and weight is 300 lb (136.1 kg). His temporal temperature is 98.1 F (36.7 C). His blood pressure is 138/86 and his pulse is 86. His respiration is 18 and oxygen saturation is 99%.  BMI: Estimated body mass index is 37.5 kg/m as calculated from the following:   Height as of this encounter: 6\' 3"  (1.905 m).   Weight as of this encounter: 300 lb (136.1 kg).  Last encounter: 08/28/2023. Last procedure: 02/13/2023  Reason for encounter: medication management. No change in medical history since last visit.  Patient's pain is at baseline.  Patient continues multimodal pain regimen as prescribed.  States that it provides pain relief and improvement in functional status.  Pharmacotherapy Assessment   Analgesic: Hydrocodone -acetaminophen  (Norco) 10-325 mg tablet every 8 hours as needed for pain.  MME=30 Pregabalin  100 Mg 3 times daily for neuropathic pain  Monitoring: [place] PMP: PDMP reviewed during this encounter.       Pharmacotherapy: No side-effects or adverse reactions reported. Compliance: No problems identified. Effectiveness: Clinically acceptable.  Calton Catholic, RN  09/03/2023  1:26 PM  Sign when Signing Visit Nursing Pain Medication Assessment:  Safety precautions to be maintained throughout the outpatient stay will include: orient to surroundings, keep bed in low position, maintain call bell within reach at all times, provide assistance with transfer out of bed and ambulation.  Medication Inspection Compliance: Pill count conducted under aseptic conditions, in front of the patient. Neither the pills nor the bottle was removed from the patient's sight at any time. Once count was completed pills were immediately returned to the patient in their original bottle.  Medication: Hydrocodone /APAP Pill/Patch Count: 64 of 90 pills/patches remain Pill/Patch Appearance: Markings consistent with prescribed medication Bottle Appearance: Standard pharmacy container. Clearly labeled. Filled Date: 05 / 29 / 2025 Last Medication intake:  TodaySafety precautions to be maintained throughout the outpatient stay will include: orient to surroundings, keep bed in low position, maintain call bell within reach at all times, provide assistance with transfer out of bed and ambulation.     No results found for: "CBDTHCR" No results found for: "D8THCCBX" No results found for: "D9THCCBX"  UDS:  Summary  Date Value Ref Range Status  08/01/2023 FINAL  Final    Comment:    ==================================================================== [facility] (MW) ==================================================================== Test  Result       Flag       Units  Drug Present and Declared for Prescription Verification   7-aminoclonazepam              268           EXPECTED   ng/mg creat    7-aminoclonazepam is an expected metabolite of clonazepam . Source of    clonazepam  is a scheduled prescription medication.    Hydrocodone                     1347         EXPECTED   ng/mg creat   Hydromorphone                   141          EXPECTED   ng/mg creat   Dihydrocodeine                 270          EXPECTED   ng/mg creat   Norhydrocodone                 934          EXPECTED   ng/mg creat    Sources of hydrocodone  include scheduled prescription medications.    Hydromorphone , dihydrocodeine and norhydrocodone are expected    metabolites of hydrocodone . Hydromorphone  and dihydrocodeine are    also available as scheduled prescription medications.  ==================================================================== Test                      Result    Flag   Units      Ref Range   Creatinine              73               mg/dL      >=86 ==================================================================== Declared Medications:  The flagging and interpretation on this report are based on the  following declared medications.  Unexpected results may arise from  inaccuracies in the declared medications.   **Note: The testing scope of this panel includes these medications:   Clonazepam  (Klonopin )  Hydrocodone  (Norco)   **Note: The testing scope of this panel does not include the  following reported medications:   Acetaminophen  (Norco)  Carbidopa  (Sinemet )  Duloxetine  (Cymbalta )  Furosemide  (Lasix )  Hydrochlorothiazide   Lamotrigine  (Lamictal )  Levodopa  (Sinemet )  Losartan  (Cozaar )  Metformin (Glucophage)  Metoprolol  (Toprol )  Nortriptyline  (Pamelor )  Omeprazole  (Prilosec)  Pregabalin  (Lyrica )  Sildenafil (Viagra)  Tizanidine  (Zanaflex )  Vitamin B12  Vitamin D3 ==================================================================== For clinical consultation, please call [phone]. ====================================================================      ROS  Constitutional: Denies any fever or chills Gastrointestinal: No reported hemesis, hematochezia, vomiting, or acute GI distress Musculoskeletal: mid lower back pain  Neurological: No reported episodes of acute onset apraxia, aphasia, dysarthria, agnosia, amnesia, paralysis, loss of coordination, or loss of consciousness  Medication Review  DULoxetine , HYDROcodone -acetaminophen , Vitamin D3, carbidopa -levodopa , clonazePAM , cyanocobalamin , furosemide , hydrochlorothiazide , lamoTRIgine , losartan , metFORMIN, metoprolol  succinate, nortriptyline , omeprazole , pregabalin , sildenafil, and tiZANidine   History Review  Allergy: Mr. Beeks is allergic to amitriptyline, bee venom, chlorhexidine, carbamazepine, hm lidocaine  patch [lidocaine ], meloxicam, morphine  and codeine, and sulfa antibiotics. Drug: Mr. Blansett  reports no history of drug use. Alcohol:  reports no history of alcohol use. Tobacco:  reports that he has never smoked. He has never used smokeless tobacco. Social: Mr. Domine  reports that he has never smoked.  He has never used smokeless tobacco. He reports that he does not drink alcohol and does not use drugs. Medical:  has a past medical history of Anxiety, Asthma, Back problem, Benign essential hypertension (11/30/2016), Cervical spondylosis without myelopathy (09/17/2012), Complication of anesthesia, Depression, Diabetes mellitus without complication (HCC) (02/2020), GERD (gastroesophageal reflux disease), Hypertension, Labile hypertension (03/19/2013), Major depressive disorder, recurrent episode, severe (HCC) (06/23/2015), Migraine headache, Migraines (11/11/2013), S/P appendectomy (04/09/2011), Seizure (HCC) (11/30/2016), Seizures (HCC), Shortness of breath, and SOB (shortness of breath) (08/06/2014). Surgical: Mr. Murty  has a past surgical history that includes Knee surgery; Posterior fusion cervical spine;  Cervical disc surgery; Appendectomy (12); Anterior cervical corpectomy (N/A, 09/15/2012); Cholecystectomy (N/A, 03/29/2017); Colonoscopy with propofol  (N/A, 10/23/2019); Esophagogastroduodenoscopy (egd) with propofol  (N/A, 10/23/2019); Lumbar laminectomy/decompression microdiscectomy (Right, 04/14/2023); and Decompressive lumbar laminectomy level 2 (Bilateral, 05/28/2023). Family: family history includes Arthritis in his father, maternal grandfather, maternal grandmother, mother, paternal grandfather, and paternal grandmother; Asthma in his mother; Coronary artery disease in his father and mother; Diabetes in his father, paternal grandfather, and paternal grandmother; Heart disease in his father, maternal grandfather, maternal grandmother, paternal grandfather, and paternal grandmother; Hypertension in his maternal grandfather, maternal grandmother, mother, paternal grandfather, and paternal grandmother; Mental illness in his mother.  Laboratory Chemistry Profile   Renal Lab Results  Component Value Date   BUN 16 07/08/2023   CREATININE 1.13 07/08/2023   LABCREA 79.3 03/05/2014   GFR 82.19 08/05/2014   GFRAA >60 04/21/2017   GFRNONAA >60 07/08/2023    Hepatic Lab Results  Component Value Date   AST 30 04/21/2017   ALT 23 04/21/2017   ALBUMIN  4.2 04/21/2017   ALKPHOS 86 04/21/2017   LIPASE 51 04/21/2017    Electrolytes Lab Results  Component Value Date   NA 135 07/08/2023   K 4.1 07/08/2023   CL 99 07/08/2023   CALCIUM 9.5 07/08/2023   MG 2.0 05/31/2023    Bone Lab Results  Component Value Date   VD25OH 31.95 03/05/2016   25OHVITD1 35 04/26/2020   25OHVITD2 <1.0 04/26/2020   25OHVITD3 35 04/26/2020    Inflammation (CRP: Acute Phase) (ESR: Chronic Phase) Lab Results  Component Value Date   CRP 10 04/26/2020   ESRSEDRATE 22 04/26/2020         Note: Above Lab results reviewed.  Recent Imaging Review  CT Angio Chest PE W and/or Wo Contrast CLINICAL DATA:  Pulmonary embolism  (PE) suspected, high prob Pt here with dizziness and swearing that occurred at 1300. Pt states he felt fine and then all of a sudden got diaphoretic and dizzy.  EXAM: CT ANGIOGRAPHY CHEST WITH CONTRAST  TECHNIQUE: Multidetector CT imaging of the chest was performed using the standard protocol during bolus administration of intravenous contrast. Multiplanar CT image reconstructions and MIPs were obtained to evaluate the vascular anatomy.  RADIATION DOSE REDUCTION: This exam was performed according to the departmental dose-optimization program which includes automated exposure control, adjustment of the mA and/or kV according to patient size and/or use of iterative reconstruction technique.  CONTRAST:  75mL OMNIPAQUE  IOHEXOL  350 MG/ML SOLN  COMPARISON:  CT abdomen pelvis 03/27/2017  FINDINGS: Cardiovascular: Fair opacification of the pulmonary arteries to the segmental level. No evidence of pulmonary embolism. Limited evaluation of the subsegmental level due to timing of contrast. The main pulmonary artery measures at the upper limits of normal. Normal heart size. No significant pericardial effusion. The thoracic aorta is normal in caliber. At least 3 vessel coronary artery calcifications.  Mediastinum/Nodes: No enlarged mediastinal,  hilar, or axillary lymph nodes. Thyroid  gland, trachea, and esophagus demonstrate no significant findings.  Lungs/Pleura: No focal consolidation. No pulmonary nodule. No pulmonary mass. No pleural effusion. No pneumothorax.  Upper Abdomen: Enlarged caudate lobe and nodular hepatic contour. Status post cholecystectomy. Colonic diverticulosis.  Musculoskeletal:  No chest wall abnormality.  Bilateral mild gynecomastia.  No suspicious lytic or blastic osseous lesions. No acute displaced fracture. Multilevel degenerative changes of the spine.  Review of the MIP images confirms the above findings.  IMPRESSION: 1. No pulmonary embolus. Limited  evaluation of the subsegmental level. 2. Cirrhosis. No focal liver lesions identified. Please note that liver protocol enhanced MR and CT are the most sensitive tests for the screening detection of hepatocellular carcinoma in the high risk setting of cirrhosis. 3. At least 3 vessel coronary artery calcification.  Electronically Signed   By: Morgane  Naveau M.D.   On: 07/08/2023 22:30 CT HEAD WO CONTRAST CLINICAL DATA:  Syncope/presyncope, dizziness.  EXAM: CT HEAD WITHOUT CONTRAST  TECHNIQUE: Contiguous axial images were obtained from the base of the skull through the vertex without intravenous contrast.  RADIATION DOSE REDUCTION: This exam was performed according to the departmental dose-optimization program which includes automated exposure control, adjustment of the mA and/or kV according to patient size and/or use of iterative reconstruction technique.  COMPARISON:  CT head 02/04/2009  FINDINGS: Brain: No acute intracranial hemorrhage. No CT evidence of acute infarct. No edema, mass effect, or midline shift. The basilar cisterns are patent.  Ventricles: The ventricles are normal.  Vascular: No hyperdense vessel or unexpected calcification.  Skull: No acute or aggressive finding.  Orbits: Orbits are symmetric.  Sinuses: Mild scattered mucosal thickening in the bilateral ethmoid sinuses. Mucous retention cysts in the sphenoid sinuses.  Other: Mastoid air cells are clear.  IMPRESSION: No CT evidence of acute intracranial abnormality.  Electronically Signed   By: Denny Flack M.D.   On: 07/08/2023 16:56 Note: Reviewed         Physical Exam  General appearance: Well nourished, well developed, and well hydrated. In no apparent acute distress Mental status: Alert, oriented x 3 (person, place, & time)       Respiratory: No evidence of acute respiratory distress Eyes: PERLA Vitals: BP 138/86 (Cuff Size: Large)   Pulse 86   Temp 98.1 F (36.7 C) (Temporal)    Resp 18   Ht 6\' 3"  (1.905 m)   Wt 300 lb (136.1 kg)   SpO2 99%   BMI 37.50 kg/m  BMI: Estimated body mass index is 37.5 kg/m as calculated from the following:   Height as of this encounter: 6\' 3"  (1.905 m).   Weight as of this encounter: 300 lb (136.1 kg). Ideal: Ideal body weight: 84.5 kg (186 lb 4.6 oz) Adjusted ideal body weight: 105.1 kg (231 lb 12.4 oz)  Assessment   Diagnosis Status  1. Chronic pain syndrome   2. Thoracic radiculopathy   3. Myelopathy of cervical spinal cord with cervical radiculopathy (HCC)   4. History of lumbar surgery (1994 L4/5 discetomy)   5. Cervical radicular pain   6. Failed cervical fusion   7. Medication management    Controlled Controlled Controlled   Updated Problems: No problems updated.  Plan of Care  Problem-specific:  Assessment and Plan Will continue on current medication regimen.  Prescribing drug monitoring (PDMP) reviewed; findings consistent with use of prescribed medication and no evidence of narcotic misuse or abuse.  Urine drug screening (UDS) up to date. Schedule follow-up in  90 days for medication management.    Mr. MARION ROSENBERRY has a current medication list which includes the following long-term medication(s): carbidopa -levodopa , carbidopa -levodopa , clonazepam , metformin, nortriptyline , sildenafil, duloxetine , [START ON 09/13/2023] hydrocodone -acetaminophen , [START ON 10/13/2023] hydrocodone -acetaminophen , [START ON 11/12/2023] hydrocodone -acetaminophen , lamotrigine , metoprolol  succinate, and pregabalin .  Pharmacotherapy (Medications Ordered): Meds ordered this encounter  Medications   HYDROcodone -acetaminophen  (NORCO) 10-325 MG tablet    Sig: Take 1 tablet by mouth every 8 (eight) hours as needed. For chronic pain syndrome. Each Rx to last 30 days.    Dispense:  90 tablet    Refill:  0   HYDROcodone -acetaminophen  (NORCO) 10-325 MG tablet    Sig: Take 1 tablet by mouth every 8 (eight) hours as needed. For chronic pain  syndrome. Each Rx to last 30 days.    Dispense:  90 tablet    Refill:  0   HYDROcodone -acetaminophen  (NORCO) 10-325 MG tablet    Sig: Take 1 tablet by mouth every 8 (eight) hours as needed. For chronic pain syndrome. Each Rx to last 30 days.    Dispense:  90 tablet    Refill:  0   pregabalin  (LYRICA ) 100 MG capsule    Sig: Take 1 capsule (100 mg total) by mouth 3 (three) times daily.    Dispense:  90 capsule    Refill:  5    Do not place this medication, or any other prescription from our practice, on "Automatic Refill". Patient may have prescription filled one day early if pharmacy is closed on scheduled refill date.   Orders:  No orders of the defined types were placed in this encounter.       Return in about 3 months (around 12/04/2023) for (F2F), (MM), Marthe Slain NP.    Recent Visits Date Type Provider Dept  08/01/23 Office Visit Praise Stennett K, NP [facility]  Showing recent visits within past 90 days and meeting all other requirements Today's Visits Date Type Provider Dept  09/03/23 Office Visit Ameirah Khatoon K, NP [facility]  Showing today's visits and meeting all other requirements Future Appointments No visits were found meeting these conditions. Showing future appointments within next 90 days and meeting all other requirements  I discussed the assessment and treatment plan with the patient. The patient was provided an opportunity to ask questions and all were answered. The patient agreed with the plan and demonstrated an understanding of the instructions.  Patient advised to call back or seek an in-person evaluation if the symptoms or condition worsens.  Duration of encounter: 30 minutes.  Total time on encounter, as per AMA guidelines included both the face-to-face and non-face-to-face time personally spent by the physician and/or other qualified health care professional(s) on the day of the encounter (includes time in activities that require the  physician or other qualified health care professional and does not include time in activities normally performed by clinical staff). Physician's time may include the following activities when performed: Preparing to see the patient (e.g., pre-charting review of records, searching for previously ordered imaging, lab work, and nerve conduction tests) Review of prior analgesic pharmacotherapies. Reviewing PMP Interpreting ordered tests (e.g., lab work, imaging, nerve conduction tests) Performing post-procedure evaluations, including interpretation of diagnostic procedures Obtaining and/or reviewing separately obtained history Performing a medically appropriate examination and/or evaluation Counseling and educating the patient/family/caregiver Ordering medications, tests, or procedures Referring and communicating with other health care professionals (when not separately reported) Documenting clinical information in the electronic or other health record Independently interpreting results (  not separately reported) and communicating results to the patient/ family/caregiver Care coordination (not separately reported)  Note by: Gabryela Kimbrell K Marcena Dias, NP (TTS and AI technology used. I apologize for any typographical errors that were not detected and corrected.) Date: 09/03/2023; Time: 2:23 PM

## 2023-09-03 NOTE — Progress Notes (Signed)
 Nursing Pain Medication Assessment:  Safety precautions to be maintained throughout the outpatient stay will include: orient to surroundings, keep bed in low position, maintain call bell within reach at all times, provide assistance with transfer out of bed and ambulation.  Medication Inspection Compliance: Pill count conducted under aseptic conditions, in front of the patient. Neither the pills nor the bottle was removed from the patient's sight at any time. Once count was completed pills were immediately returned to the patient in their original bottle.  Medication: Hydrocodone /APAP Pill/Patch Count: 64 of 90 pills/patches remain Pill/Patch Appearance: Markings consistent with prescribed medication Bottle Appearance: Standard pharmacy container. Clearly labeled. Filled Date: 05 / 29 / 2025 Last Medication intake:  TodaySafety precautions to be maintained throughout the outpatient stay will include: orient to surroundings, keep bed in low position, maintain call bell within reach at all times, provide assistance with transfer out of bed and ambulation.

## 2023-09-03 NOTE — Patient Instructions (Signed)

## 2023-09-11 IMAGING — MR MR LUMBAR SPINE W/O CM
5 series · 33 of 48 positions shown · non-contrast
Comparison: 05/13/2017 MRI thoracic spine

CLINICAL DATA: Low back pain

EXAM:
MRI THORACIC AND LUMBAR SPINE WITHOUT CONTRAST
TECHNIQUE: Multiplanar and multiecho pulse sequences of the thoracic and lumbar
spine were obtained without intravenous contrast.

[Series 6: T2 · sagittal · 4.0mm · 1.02mm/px · 6 of 17 slices shown (1 of 2)]
[im 1/17]
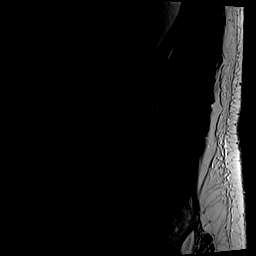
[im 4/17]
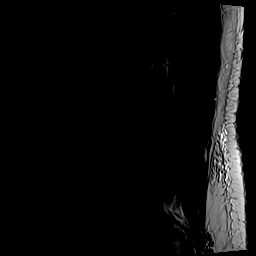
[im 7/17]
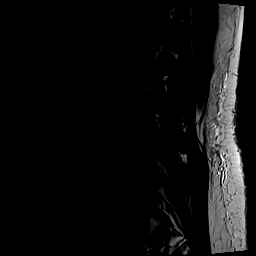
[im 10/17]
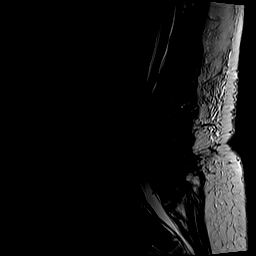
[im 13/17]
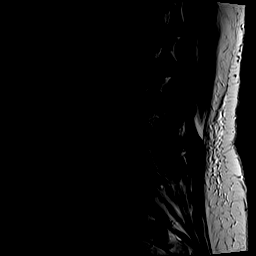
[im 17/17]
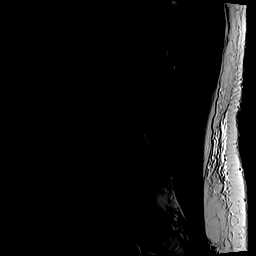

[Series 7: T1 · sagittal · 4.0mm · 1.02mm/px · 6 of 17 slices shown (1 of 2)]
[im 1/17]
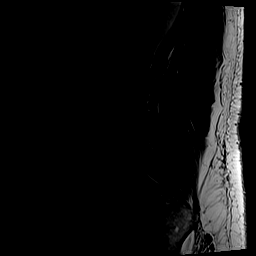
[im 4/17]
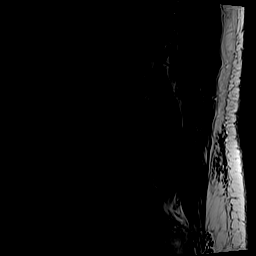
[im 7/17]
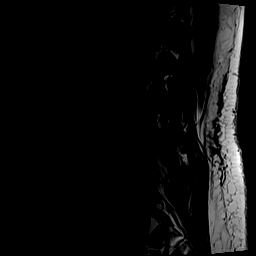
[im 10/17]
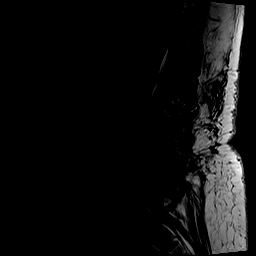
[im 13/17]
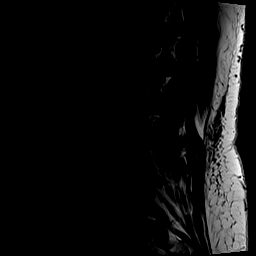
[im 17/17]
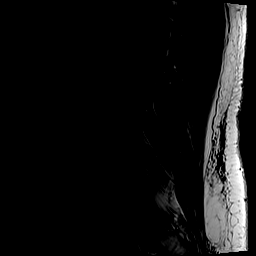

[Series 8: STIR · sagittal · 4.0mm · 0.51mm/px · 3 of 17 slices shown]
[im 1/17]
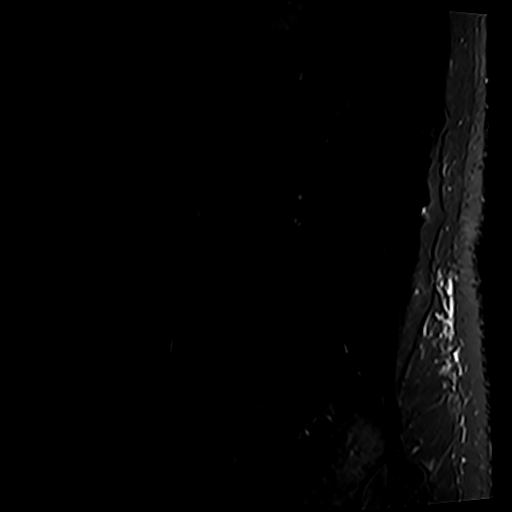
[im 4/17]
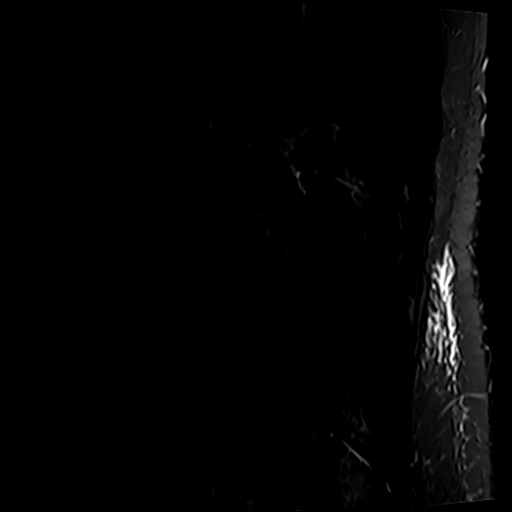
[im 7/17]
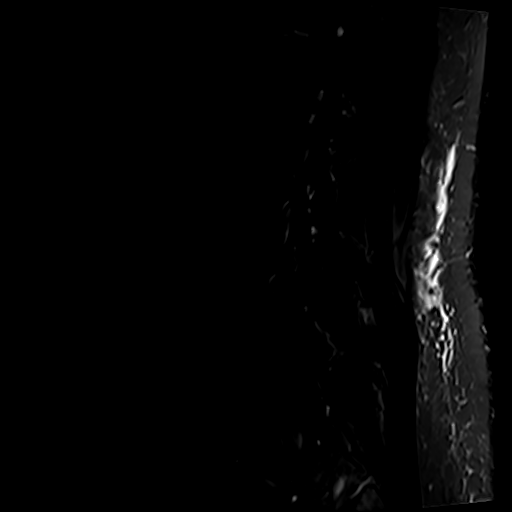

[Series 9: T2 · axial · 4.0mm · 0.78mm/px · z∈[-439,-211]mm · 9 of 39 slices shown (2 of 2)]
[im 1/39]
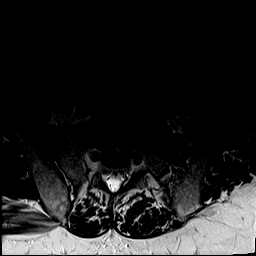
[im 6/39]
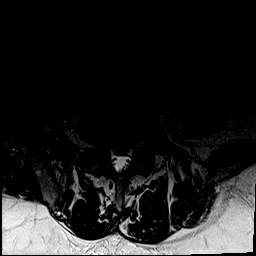
[im 11/39]
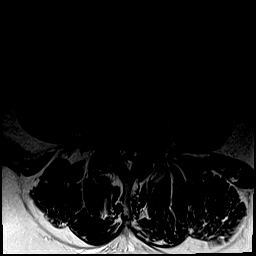
[im 17/39]
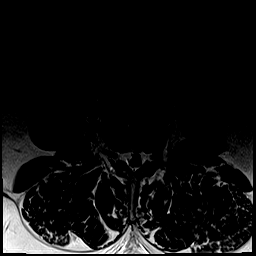
[im 20/39]
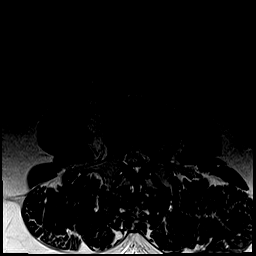
[im 22/39]
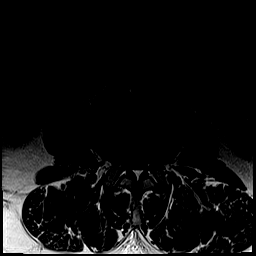
[im 28/39]
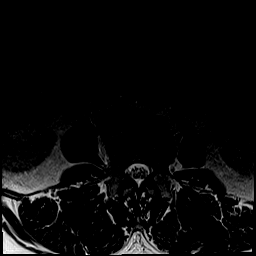
[im 33/39]
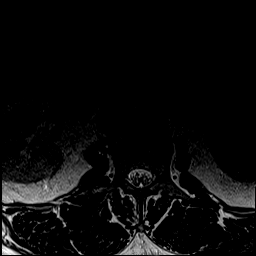
[im 39/39]
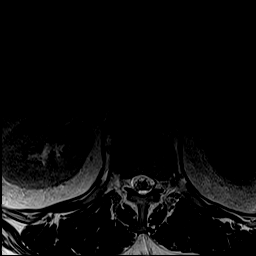

[Series 10: T1 · axial · 4.0mm · 0.39mm/px · z∈[-439,-211]mm · 9 of 39 slices shown (2 of 2)]
[im 1/39]
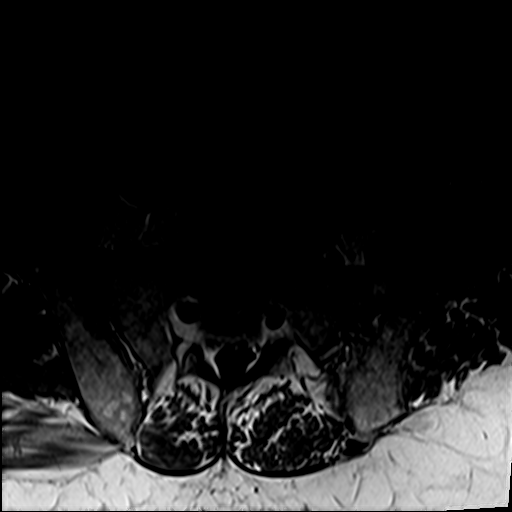
[im 6/39]
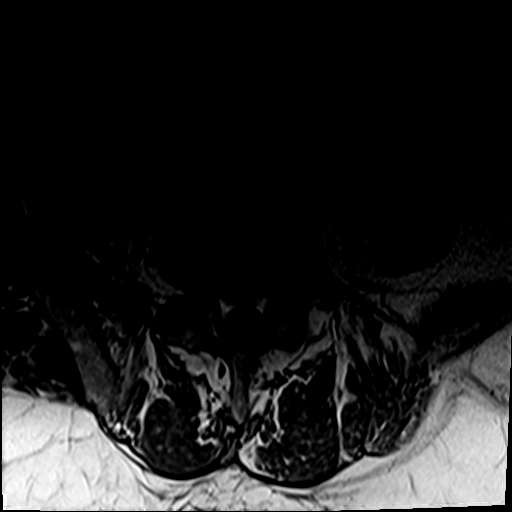
[im 11/39]
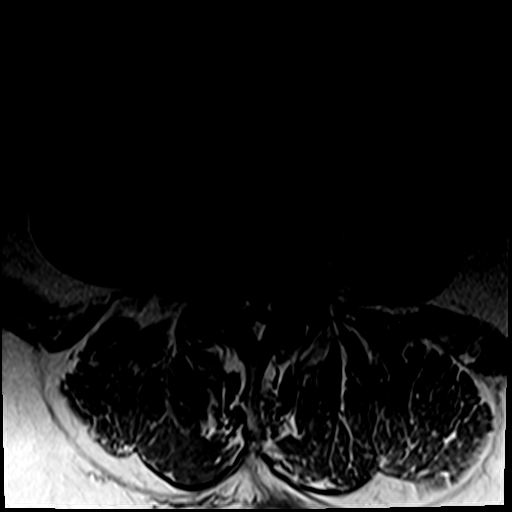
[im 17/39]
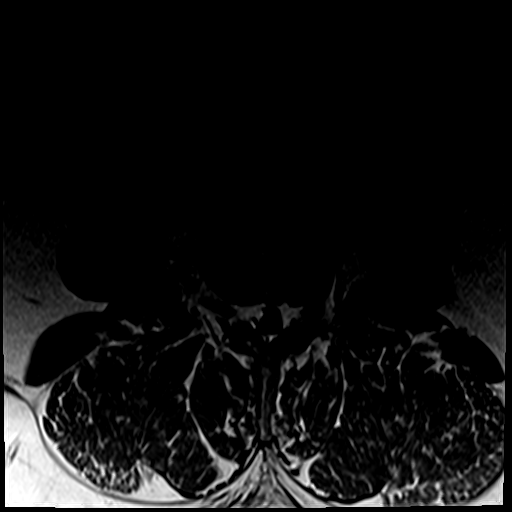
[im 20/39]
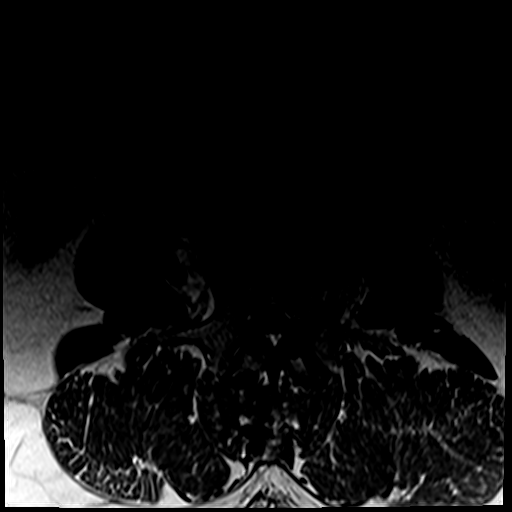
[im 22/39]
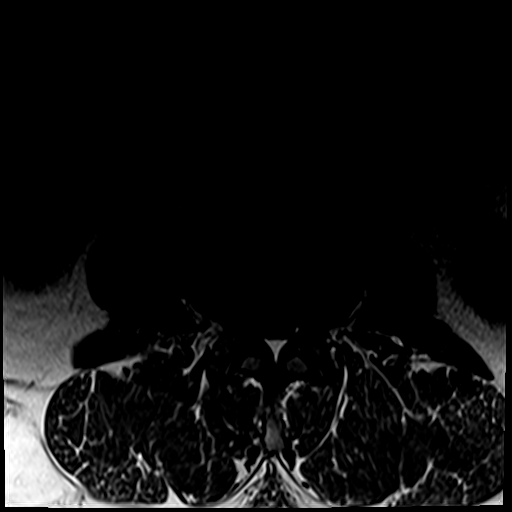
[im 28/39]
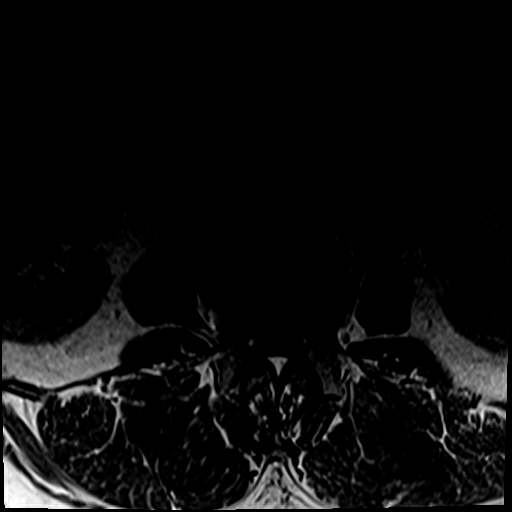
[im 33/39]
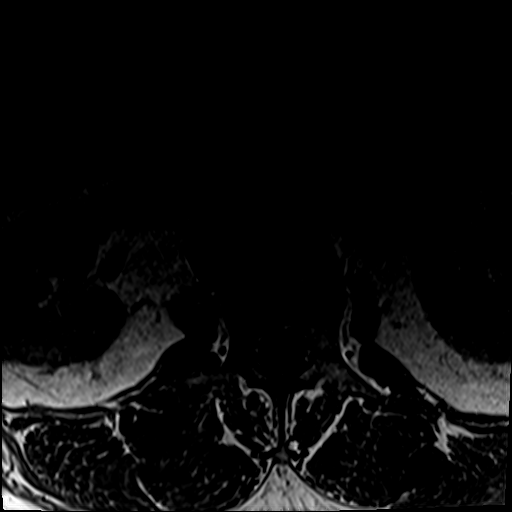
[im 39/39]
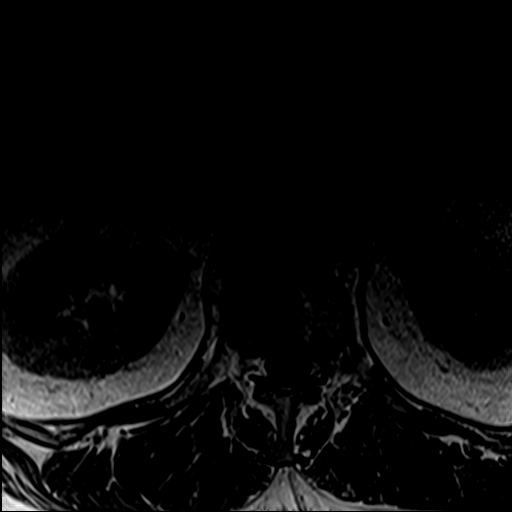

[33 of 48 positions shown; findings below may reference images not displayed]

FINDINGS: MRI THORACIC SPINE FINDINGS

Alignment:  Physiologic.

Vertebrae: T2 hemangioma.  No acute lesion.

Cord:  Normal

Paraspinal and other soft tissues: Negative

Disc levels:

T3-4: Small central disc protrusion without stenosis.

The other thoracic levels are normal.

MRI LUMBAR SPINE FINDINGS

Segmentation:  Standard.

Alignment:  Grade 1 retrolisthesis at L2-3, L3-4, L4-5 and L5-S1.

Vertebrae:  No fracture, evidence of discitis, or bone lesion.

Conus medullaris and cauda equina: Conus extends to the L1 level.
Conus and cauda equina appear normal.

Paraspinal and other soft tissues: Negative.

Disc levels:

L1-L2: Normal disc space and facet joints. No spinal canal stenosis.
No neural foraminal stenosis.

L2-L3: Small disc bulge and congenitally narrow spinal canal. Severe
spinal canal stenosis. No neural foraminal stenosis.

L3-L4: Small disc bulge with congenitally narrow spinal canal.
Severe spinal canal stenosis. No neural foraminal stenosis.

L4-L5: Small left asymmetric disc bulge with congenitally narrow
spinal canal. Severe spinal canal stenosis. Moderate right and
severe left neural foraminal stenosis.

L5-S1: Small disc bulge. No spinal canal stenosis. Moderate left
neural foraminal stenosis.

Visualized sacrum: Normal.
IMPRESSION: 1. Severe spinal canal stenosis at the L2-L5 levels due to
combination of disc bulges and congenitally narrow spinal canal.
2. Moderate right and severe left neural foraminal stenosis at L4-5
and moderate left neural foraminal stenosis at L5-S1.
3. No thoracic spinal canal or neural foraminal stenosis.

## 2023-09-12 ENCOUNTER — Other Ambulatory Visit: Payer: Self-pay | Admitting: Student in an Organized Health Care Education/Training Program

## 2023-09-12 DIAGNOSIS — M5412 Radiculopathy, cervical region: Secondary | ICD-10-CM

## 2023-09-12 DIAGNOSIS — G894 Chronic pain syndrome: Secondary | ICD-10-CM

## 2023-09-12 DIAGNOSIS — M5414 Radiculopathy, thoracic region: Secondary | ICD-10-CM

## 2023-10-21 ENCOUNTER — Encounter: Payer: Self-pay | Admitting: Neurosurgery

## 2023-10-21 ENCOUNTER — Ambulatory Visit (INDEPENDENT_AMBULATORY_CARE_PROVIDER_SITE_OTHER): Admitting: Neurosurgery

## 2023-10-21 VITALS — BP 126/76 | Ht 75.0 in | Wt 300.0 lb

## 2023-10-21 DIAGNOSIS — M5412 Radiculopathy, cervical region: Secondary | ICD-10-CM | POA: Diagnosis not present

## 2023-10-21 DIAGNOSIS — G959 Disease of spinal cord, unspecified: Secondary | ICD-10-CM | POA: Diagnosis not present

## 2023-10-21 DIAGNOSIS — Z981 Arthrodesis status: Secondary | ICD-10-CM | POA: Diagnosis not present

## 2023-10-21 NOTE — Progress Notes (Signed)
   DOS: 04/14/23 And 05/28/23  HISTORY OF PRESENT ILLNESS: 10/21/23  [name] is status post lumbar discectomy with repeat decompression.  He is done well from a lumbar spine standpoint.  Every once a while gets some pulling in his back but otherwise has had continued improvement in his gait and ambulation.  He feels like his back pain is better.  Now that he has been able to be more mobile he has noticed that his neck has been bothering him more often.  He has felt unsteady on his feet at times.  Also having numbness and tingling going into his hands.  Also has neck pain that has been slowly worsening with time.   PHYSICAL EXAMINATION:   Vitals:   10/21/23 1450  BP: 126/76   General: Patient is well developed, well nourished, calm, collected, and in no apparent distress.  NEUROLOGICAL:  General: In no acute distress.  Awake, alert, oriented to person, place, and time. Pupils equal round and reactive to light.  Strength: Good activation of his bilateral lower extremities proximally and distally.  Incision c/d/i   ROS (Neurologic): Negative except as noted above  IMAGING: No new imaging  ASSESSMENT/PLAN:  Thomas Cole is here today for evaluation after a lumbar laminectomy.  He originally had a lumbar discectomy and was doing well but that of reherniation syndrome was admitted to the hospital with worsening functional status.  Given his severe inability to ambulate secondary to pain and claudicatory symptoms we took him to the OR for a midline decompression of his lumbar spine.   Overall he is doing much better since his lumbar laminectomy and decompression.  He has had much better lower extremity function and ability to walk and ambulate.  He does have a history of chronic cervical myelopathy and radiculopathy.  He has not had this evaluated in many years as he has been having difficulty with his back and other medical conditions.  Now he is starting to notice his bilateral  hand numbness as well as neck pain and intermittent radiation into his arms.  He also gets some unsteadiness on his feet.  In regards to his cervical myeloradiculopathy which is chronic and has had a previous fusion I plan to get flexion-extension x-rays of the cervical spine, would also like to get an MRI of the cervical spine.  Given his nighttime predominant symptoms in his hands I would like to also get a EMG nerve conduction study to evaluate whether or not he has a radiculopathy versus carpal tunnel syndrome.  Once he has had these test I like to see him back in clinic for follow-up.  Penne MICAEL Sharps, MD/MS Department of Neurosurgery

## 2023-10-24 ENCOUNTER — Telehealth: Payer: Self-pay

## 2023-10-24 ENCOUNTER — Other Ambulatory Visit: Payer: Self-pay

## 2023-10-24 DIAGNOSIS — G894 Chronic pain syndrome: Secondary | ICD-10-CM

## 2023-10-24 DIAGNOSIS — M5412 Radiculopathy, cervical region: Secondary | ICD-10-CM

## 2023-10-24 DIAGNOSIS — M5414 Radiculopathy, thoracic region: Secondary | ICD-10-CM

## 2023-10-24 MED ORDER — NORTRIPTYLINE HCL 25 MG PO CAPS: 50.0000 mg | ORAL_CAPSULE | Freq: Every day | ORAL | 5 refills | Status: AC

## 2023-10-24 NOTE — Telephone Encounter (Signed)
 Telephone call from Pharmacy, patient wants refill on Nortriptyline  25mg . [facility].

## 2023-11-12 ENCOUNTER — Encounter: Payer: Self-pay | Admitting: Neurology

## 2023-11-14 ENCOUNTER — Other Ambulatory Visit: Payer: Self-pay

## 2023-11-14 DIAGNOSIS — R202 Paresthesia of skin: Secondary | ICD-10-CM

## 2023-11-19 ENCOUNTER — Other Ambulatory Visit: Payer: Self-pay | Admitting: Nurse Practitioner

## 2023-11-19 ENCOUNTER — Other Ambulatory Visit: Payer: Self-pay | Admitting: Student in an Organized Health Care Education/Training Program

## 2023-11-19 DIAGNOSIS — G894 Chronic pain syndrome: Secondary | ICD-10-CM

## 2023-11-19 DIAGNOSIS — M5412 Radiculopathy, cervical region: Secondary | ICD-10-CM

## 2023-11-19 DIAGNOSIS — M5414 Radiculopathy, thoracic region: Secondary | ICD-10-CM

## 2023-11-30 ENCOUNTER — Encounter: Payer: Self-pay | Admitting: Neurosurgery

## 2023-12-03 ENCOUNTER — Ambulatory Visit: Attending: Nurse Practitioner | Admitting: Nurse Practitioner

## 2023-12-03 ENCOUNTER — Encounter: Payer: Self-pay | Admitting: Nurse Practitioner

## 2023-12-03 VITALS — BP 130/85 | HR 86 | Temp 97.7°F | Resp 20 | Ht 75.0 in | Wt 315.0 lb

## 2023-12-03 DIAGNOSIS — G894 Chronic pain syndrome: Secondary | ICD-10-CM | POA: Diagnosis not present

## 2023-12-03 DIAGNOSIS — Z9889 Other specified postprocedural states: Secondary | ICD-10-CM | POA: Insufficient documentation

## 2023-12-03 DIAGNOSIS — M5414 Radiculopathy, thoracic region: Secondary | ICD-10-CM | POA: Diagnosis not present

## 2023-12-03 DIAGNOSIS — G959 Disease of spinal cord, unspecified: Secondary | ICD-10-CM | POA: Diagnosis present

## 2023-12-03 DIAGNOSIS — M5412 Radiculopathy, cervical region: Secondary | ICD-10-CM | POA: Insufficient documentation

## 2023-12-03 DIAGNOSIS — M960 Pseudarthrosis after fusion or arthrodesis: Secondary | ICD-10-CM | POA: Diagnosis present

## 2023-12-03 DIAGNOSIS — Z79899 Other long term (current) drug therapy: Secondary | ICD-10-CM | POA: Diagnosis present

## 2023-12-03 MED ORDER — HYDROCODONE-ACETAMINOPHEN 10-325 MG PO TABS: 1.0000 | ORAL_TABLET | Freq: Three times a day (TID) | ORAL | 0 refills | Status: DC | PRN

## 2023-12-03 MED ORDER — PREGABALIN 100 MG PO CAPS: 100.0000 mg | ORAL_CAPSULE | Freq: Three times a day (TID) | ORAL | 5 refills | Status: AC

## 2023-12-03 NOTE — Progress Notes (Signed)
 PROVIDER NOTE: Interpretation of information contained herein should be left to medically-trained personnel. Specific patient instructions are provided elsewhere under Patient Instructions section of medical record. This document was created in part using AI and STT-dictation technology, any transcriptional errors that may result from this process are unintentional.  Patient: Thomas Cole  Service: E/M   PCP: Diedra Lame, MD  DOB: Oct 15, 1970  DOS: 12/03/2023  Provider: Emmy MARLA Blanch, NP  MRN: [id number]  Delivery: Face-to-face  Specialty: Interventional Pain Management  Type: Established Patient  Setting: Ambulatory outpatient facility  Specialty designation: 09  Referring Prov.: Diedra Lame, MD  Location: Outpatient office facility       History of present illness (HPI) Thomas Cole, a 53 y.o. year old male, is here today because of his History of lumbar surgery [Z98.890]. [name] primary complain today is Neck Pain  Pertinent problems: [name] has Spondylosis, cervical, with myelopathy; Migraines; History of seizures; Generalized anxiety disorder; and Bilateral occipital neuralgia on their pertinent problem list.   Pain Assessment: Severity of Chronic pain is reported as a 6 /10. Location: Neck Right, Left/Radiating down into the shoulders. Onset: More than a month ago. Quality: Aching, Tingling, Pressure. Timing: Constant. Modifying factor(s): Nothing. Vitals:  height is 6' 3 (1.905 m) and weight is 315 lb (142.9 kg) (abnormal). His temperature is 97.7 F (36.5 C). His blood pressure is 130/85 and his pulse is 86. His respiration is 20 and oxygen saturation is 98%.  BMI: Estimated body mass index is 39.37 kg/m as calculated from the following:   Height as of this encounter: 6' 3 (1.905 m).   Weight as of this encounter: 315 lb (142.9 kg).  Last encounter: 09/03/2023. Last procedure: Visit date not found.  Reason for encounter: medication management. No change in medical  history since last visit.  Patient's pain is at baseline.  Patient continues multimodal pain regimen as prescribed.  States that it provides pain relief and improvement in functional status.   Pharmacotherapy Assessment   Analgesic: Hydrocodone -acetaminophen  (Norco) 10-325 mg tablet every 8 hours as needed for pain. MME=30 Pregabalin  100 Mg 3 times daily for neuropathic pain Monitoring: [place] PMP: PDMP reviewed during this encounter.       Pharmacotherapy: No side-effects or adverse reactions reported. Compliance: No problems identified. Effectiveness: Clinically acceptable.  Erlene Doyal SAUNDERS, [place]  12/03/2023  2:26 PM  Sign when Signing Visit Nursing Pain Medication Assessment:  Safety precautions to be maintained throughout the outpatient stay will include: orient to surroundings, keep bed in low position, maintain call bell within reach at all times, provide assistance with transfer out of bed and ambulation.  Medication Inspection Compliance: Pill count conducted under aseptic conditions, in front of the patient. Neither the pills nor the bottle was removed from the patient's sight at any time. Once count was completed pills were immediately returned to the patient in their original bottle.  Medication: Oxycodone /APAP Pill/Patch Count: 57 of 90 pills/patches remain Pill/Patch Appearance: Markings consistent with prescribed medication Bottle Appearance: Standard pharmacy container. Clearly labeled. Filled Date: 08 / 21 / 2025 Last Medication intake:  Today    UDS:  Summary  Date Value Ref Range Status  08/01/2023 FINAL  Final    Comment:    ==================================================================== [employer] [facility] 13 (MW) ==================================================================== Test                             Result  Flag       Units  Drug Present and Declared for Prescription Verification   7-aminoclonazepam              268          EXPECTED   ng/mg  creat    7-aminoclonazepam is an expected metabolite of clonazepam . Source of    clonazepam  is a scheduled prescription medication.    Hydrocodone                     1347         EXPECTED   ng/mg creat   Hydromorphone                   141          EXPECTED   ng/mg creat   Dihydrocodeine                 270          EXPECTED   ng/mg creat   Norhydrocodone                 934          EXPECTED   ng/mg creat    Sources of hydrocodone  include scheduled prescription medications.    Hydromorphone , dihydrocodeine and norhydrocodone are expected    metabolites of hydrocodone . Hydromorphone  and dihydrocodeine are    also available as scheduled prescription medications.  ==================================================================== Test                      Result    Flag   Units      Ref Range   Creatinine              73               mg/dL      >=79 ==================================================================== Declared Medications:  The flagging and interpretation on this report are based on the  following declared medications.  Unexpected results may arise from  inaccuracies in the declared medications.   **Note: The testing scope of this panel includes these medications:   Clonazepam  (Klonopin )  Hydrocodone  (Norco)   **Note: The testing scope of this panel does not include the  following reported medications:   Acetaminophen  (Norco)  Carbidopa  (Sinemet )  Duloxetine  (Cymbalta )  Furosemide  (Lasix )  Hydrochlorothiazide   Lamotrigine  (Lamictal )  Levodopa  (Sinemet )  Losartan  (Cozaar )  Metformin (Glucophage)  Metoprolol  (Toprol )  Nortriptyline  (Pamelor )  Omeprazole  (Prilosec)  Pregabalin  (Lyrica )  Sildenafil (Viagra)  Tizanidine  (Zanaflex )  Vitamin B12  Vitamin D3 ==================================================================== For clinical consultation, please call [phone]. ====================================================================     No  results found for: CBDTHCR No results found for: D8THCCBX No results found for: D9THCCBX  ROS  Constitutional: Denies any fever or chills Gastrointestinal: No reported hemesis, hematochezia, vomiting, or acute GI distress Musculoskeletal: Neck pain  Neurological: No reported episodes of acute onset apraxia, aphasia, dysarthria, agnosia, amnesia, paralysis, loss of coordination, or loss of consciousness  Medication Review  DULoxetine , HYDROcodone -acetaminophen , Vitamin D3, carbidopa -levodopa , clonazePAM , cyanocobalamin , furosemide , hydrochlorothiazide , lamoTRIgine , losartan , metFORMIN, metoprolol  succinate, nortriptyline , omeprazole , prazosin, pregabalin , sildenafil, and tiZANidine   History Review  Allergy: Mr. Mazor is allergic to amitriptyline, bee venom, chlorhexidine, carbamazepine, hm lidocaine  patch [lidocaine ], meloxicam, morphine  and codeine, and sulfa antibiotics. Drug: Mr. Marlett  reports no history of drug use. Alcohol:  reports no history of alcohol use. Tobacco:  reports that he has never smoked. He has never used smokeless tobacco. Social: Mr. Aydelott  reports that he has never smoked. He has never used smokeless tobacco. He reports that he does not drink alcohol and does not use drugs. Medical:  has a past medical history of Anxiety, Asthma, Back problem, Benign essential hypertension (11/30/2016), Cervical spondylosis without myelopathy (09/17/2012), Complication of anesthesia, Depression, Diabetes mellitus without complication (HCC) (02/2020), GERD (gastroesophageal reflux disease), Hypertension, Labile hypertension (03/19/2013), Major depressive disorder, recurrent episode, severe (HCC) (06/23/2015), Migraine headache, Migraines (11/11/2013), S/P appendectomy (04/09/2011), Seizure (HCC) (11/30/2016), Seizures (HCC), Shortness of breath, and SOB (shortness of breath) (08/06/2014). Surgical: Mr. Burgard  has a past surgical history that includes Knee surgery; Posterior fusion cervical  spine; Cervical disc surgery; Appendectomy (12); Anterior cervical corpectomy (N/A, 09/15/2012); Cholecystectomy (N/A, 03/29/2017); Colonoscopy with propofol  (N/A, 10/23/2019); Esophagogastroduodenoscopy (egd) with propofol  (N/A, 10/23/2019); Lumbar laminectomy/decompression microdiscectomy (Right, 04/14/2023); and Decompressive lumbar laminectomy level 2 (Bilateral, 05/28/2023). Family: family history includes Arthritis in his father, maternal grandfather, maternal grandmother, mother, paternal grandfather, and paternal grandmother; Asthma in his mother; Coronary artery disease in his father and mother; Diabetes in his father, paternal grandfather, and paternal grandmother; Heart disease in his father, maternal grandfather, maternal grandmother, paternal grandfather, and paternal grandmother; Hypertension in his maternal grandfather, maternal grandmother, mother, paternal grandfather, and paternal grandmother; Mental illness in his mother.  Laboratory Chemistry Profile   Renal Lab Results  Component Value Date   BUN 16 07/08/2023   CREATININE 1.13 07/08/2023   LABCREA 79.3 03/05/2014   GFR 82.19 08/05/2014   GFRAA >60 04/21/2017   GFRNONAA >60 07/08/2023    Hepatic Lab Results  Component Value Date   AST 30 04/21/2017   ALT 23 04/21/2017   ALBUMIN  4.2 04/21/2017   ALKPHOS 86 04/21/2017   LIPASE 51 04/21/2017    Electrolytes Lab Results  Component Value Date   NA 135 07/08/2023   K 4.1 07/08/2023   CL 99 07/08/2023   CALCIUM 9.5 07/08/2023   MG 2.0 05/31/2023    Bone Lab Results  Component Value Date   VD25OH 31.95 03/05/2016   25OHVITD1 35 04/26/2020   25OHVITD2 <1.0 04/26/2020   25OHVITD3 35 04/26/2020    Inflammation (CRP: Acute Phase) (ESR: Chronic Phase) Lab Results  Component Value Date   CRP 10 04/26/2020   ESRSEDRATE 22 04/26/2020         Note: Above Lab results reviewed.  Recent Imaging Review  CT Angio Chest PE W and/or Wo Contrast CLINICAL DATA:  Pulmonary  embolism (PE) suspected, high prob Pt here with dizziness and swearing that occurred at 1300. Pt states he felt fine and then all of a sudden got diaphoretic and dizzy.  EXAM: CT ANGIOGRAPHY CHEST WITH CONTRAST  TECHNIQUE: Multidetector CT imaging of the chest was performed using the standard protocol during bolus administration of intravenous contrast. Multiplanar CT image reconstructions and MIPs were obtained to evaluate the vascular anatomy.  RADIATION DOSE REDUCTION: This exam was performed according to the departmental dose-optimization program which includes automated exposure control, adjustment of the mA and/or kV according to patient size and/or use of iterative reconstruction technique.  CONTRAST:  75mL OMNIPAQUE  IOHEXOL  350 MG/ML SOLN  COMPARISON:  CT abdomen pelvis 03/27/2017  FINDINGS: Cardiovascular: Fair opacification of the pulmonary arteries to the segmental level. No evidence of pulmonary embolism. Limited evaluation of the subsegmental level due to timing of contrast. The main pulmonary artery measures at the upper limits of normal. Normal heart size. No significant pericardial effusion. The thoracic aorta is normal in caliber. At least 3 vessel coronary artery  calcifications.  Mediastinum/Nodes: No enlarged mediastinal, hilar, or axillary lymph nodes. Thyroid  gland, trachea, and esophagus demonstrate no significant findings.  Lungs/Pleura: No focal consolidation. No pulmonary nodule. No pulmonary mass. No pleural effusion. No pneumothorax.  Upper Abdomen: Enlarged caudate lobe and nodular hepatic contour. Status post cholecystectomy. Colonic diverticulosis.  Musculoskeletal:  No chest wall abnormality.  Bilateral mild gynecomastia.  No suspicious lytic or blastic osseous lesions. No acute displaced fracture. Multilevel degenerative changes of the spine.  Review of the MIP images confirms the above findings.  IMPRESSION: 1. No pulmonary embolus.  Limited evaluation of the subsegmental level. 2. Cirrhosis. No focal liver lesions identified. Please note that liver protocol enhanced MR and CT are the most sensitive tests for the screening detection of hepatocellular carcinoma in the high risk setting of cirrhosis. 3. At least 3 vessel coronary artery calcification.  Electronically Signed   By: Morgane  Naveau M.D.   On: 07/08/2023 22:30 CT HEAD WO CONTRAST CLINICAL DATA:  Syncope/presyncope, dizziness.  EXAM: CT HEAD WITHOUT CONTRAST  TECHNIQUE: Contiguous axial images were obtained from the base of the skull through the vertex without intravenous contrast.  RADIATION DOSE REDUCTION: This exam was performed according to the departmental dose-optimization program which includes automated exposure control, adjustment of the mA and/or kV according to patient size and/or use of iterative reconstruction technique.  COMPARISON:  CT head 02/04/2009  FINDINGS: Brain: No acute intracranial hemorrhage. No CT evidence of acute infarct. No edema, mass effect, or midline shift. The basilar cisterns are patent.  Ventricles: The ventricles are normal.  Vascular: No hyperdense vessel or unexpected calcification.  Skull: No acute or aggressive finding.  Orbits: Orbits are symmetric.  Sinuses: Mild scattered mucosal thickening in the bilateral ethmoid sinuses. Mucous retention cysts in the sphenoid sinuses.  Other: Mastoid air cells are clear.  IMPRESSION: No CT evidence of acute intracranial abnormality.  Electronically Signed   By: Donnice Mania M.D.   On: 07/08/2023 16:56 Note: Reviewed        Physical Exam  Vitals: BP 130/85   Pulse 86   Temp 97.7 F (36.5 C)   Resp 20   Ht 6' 3 (1.905 m)   Wt (!) 315 lb (142.9 kg)   SpO2 98%   BMI 39.37 kg/m  BMI: Estimated body mass index is 39.37 kg/m as calculated from the following:   Height as of this encounter: 6' 3 (1.905 m).   Weight as of this encounter: 315 lb  (142.9 kg). Ideal: Ideal body weight: 84.5 kg (186 lb 4.6 oz) Adjusted ideal body weight: 107.9 kg (237 lb 12.4 oz) General appearance: Well nourished, well developed, and well hydrated. In no apparent acute distress Mental status: Alert, oriented x 3 (person, place, & time)       Respiratory: No evidence of acute respiratory distress Eyes: PERLA   Assessment   Diagnosis Status  1. History of lumbar surgery (1994 L4/5 discetomy)   2. Chronic pain syndrome   3. Thoracic radiculopathy   4. Myelopathy of cervical spinal cord with cervical radiculopathy (HCC)   5. Cervical radicular pain   6. Failed cervical fusion   7. Medication management    Controlled Controlled Controlled   Updated Problems: No problems updated.  Plan of Care  Problem-specific:  Assessment and Plan Will continue on current medication regimen.  Prescribing drug monitoring (PDMP) reviewed; findings consistent with use of prescribed medication and no evidence of narcotic misuse or abuse.  Urine drug screening (UDS) up to date.  Schedule follow-up in 90 days for medication management.    Mr. IZMAEL DUROSS has a current medication list which includes the following long-term medication(s): carbidopa -levodopa , carbidopa -levodopa , clonazepam , duloxetine , hydrocodone -acetaminophen , lamotrigine , metformin, metoprolol  succinate, nortriptyline , prazosin, sildenafil, [START ON 12/12/2023] hydrocodone -acetaminophen , [START ON 01/11/2024] hydrocodone -acetaminophen , [START ON 02/10/2024] hydrocodone -acetaminophen , and pregabalin .  Pharmacotherapy (Medications Ordered): Meds ordered this encounter  Medications   HYDROcodone -acetaminophen  (NORCO) 10-325 MG tablet    Sig: Take 1 tablet by mouth every 8 (eight) hours as needed. For chronic pain syndrome. Each Rx to last 30 days.    Dispense:  90 tablet    Refill:  0   HYDROcodone -acetaminophen  (NORCO) 10-325 MG tablet    Sig: Take 1 tablet by mouth every 8 (eight) hours as  needed. For chronic pain syndrome. Each Rx to last 30 days.    Dispense:  90 tablet    Refill:  0   HYDROcodone -acetaminophen  (NORCO) 10-325 MG tablet    Sig: Take 1 tablet by mouth every 8 (eight) hours as needed. For chronic pain syndrome. Each Rx to last 30 days.    Dispense:  90 tablet    Refill:  0   pregabalin  (LYRICA ) 100 MG capsule    Sig: Take 1 capsule (100 mg total) by mouth 3 (three) times daily.    Dispense:  90 capsule    Refill:  5    Do not place this medication, or any other prescription from our practice, on Automatic Refill. Patient may have prescription filled one day early if pharmacy is closed on scheduled refill date.   Orders:  No orders of the defined types were placed in this encounter.       Return in about 3 months (around 03/03/2024) for (F2F), (MM), Emmy Blanch NP.    Recent Visits No visits were found meeting these conditions. Showing recent visits within past 90 days and meeting all other requirements Today's Visits Date Type Provider Dept  12/03/23 Office Visit Mumin Denomme K, NP [facility]  Showing today's visits and meeting all other requirements Future Appointments Date Type Provider Dept  03/02/24 Appointment Eiley Mcginnity K, NP [facility]  Showing future appointments within next 90 days and meeting all other requirements  I discussed the assessment and treatment plan with the patient. The patient was provided an opportunity to ask questions and all were answered. The patient agreed with the plan and demonstrated an understanding of the instructions.  Patient advised to call back or seek an in-person evaluation if the symptoms or condition worsens.  Duration of encounter: 30 minutes.  Total time on encounter, as per AMA guidelines included both the face-to-face and non-face-to-face time personally spent by the physician and/or other qualified health care professional(s) on the day of the encounter (includes time in  activities that require the physician or other qualified health care professional and does not include time in activities normally performed by clinical staff). Physician's time may include the following activities when performed: Preparing to see the patient (e.g., pre-charting review of records, searching for previously ordered imaging, lab work, and nerve conduction tests) Review of prior analgesic pharmacotherapies. Reviewing PMP Interpreting ordered tests (e.g., lab work, imaging, nerve conduction tests) Performing post-procedure evaluations, including interpretation of diagnostic procedures Obtaining and/or reviewing separately obtained history Performing a medically appropriate examination and/or evaluation Counseling and educating the patient/family/caregiver Ordering medications, tests, or procedures Referring and communicating with other health care professionals (when not separately reported) Documenting clinical information in the electronic or other health  record Independently interpreting results (not separately reported) and communicating results to the patient/ family/caregiver Care coordination (not separately reported)  Note by: Harman Ferrin K Dustin Bumbaugh, NP (TTS and AI technology used. I apologize for any typographical errors that were not detected and corrected.) Date: 12/03/2023; Time: 3:37 PM

## 2023-12-03 NOTE — Progress Notes (Signed)
 Nursing Pain Medication Assessment:  Safety precautions to be maintained throughout the outpatient stay will include: orient to surroundings, keep bed in low position, maintain call bell within reach at all times, provide assistance with transfer out of bed and ambulation.  Medication Inspection Compliance: Pill count conducted under aseptic conditions, in front of the patient. Neither the pills nor the bottle was removed from the patient's sight at any time. Once count was completed pills were immediately returned to the patient in their original bottle.  Medication: Oxycodone /APAP Pill/Patch Count: 57 of 90 pills/patches remain Pill/Patch Appearance: Markings consistent with prescribed medication Bottle Appearance: Standard pharmacy container. Clearly labeled. Filled Date: 08 / 21 / 2025 Last Medication intake:  Today

## 2023-12-04 ENCOUNTER — Encounter: Admitting: Nurse Practitioner

## 2023-12-10 ENCOUNTER — Ambulatory Visit
Admission: RE | Admit: 2023-12-10 | Discharge: 2023-12-10 | Disposition: A | Discharge status: Home / Self Care | Source: Ambulatory Visit | Attending: Neurosurgery | Admitting: Neurosurgery

## 2023-12-10 DIAGNOSIS — M5412 Radiculopathy, cervical region: Secondary | ICD-10-CM | POA: Diagnosis present

## 2023-12-10 DIAGNOSIS — Z981 Arthrodesis status: Secondary | ICD-10-CM | POA: Diagnosis present

## 2023-12-10 DIAGNOSIS — G959 Disease of spinal cord, unspecified: Secondary | ICD-10-CM | POA: Diagnosis present

## 2023-12-23 ENCOUNTER — Encounter: Payer: Self-pay | Admitting: Neurosurgery

## 2023-12-24 ENCOUNTER — Ambulatory Visit: Payer: Self-pay | Admitting: Neurosurgery

## 2023-12-25 NOTE — Telephone Encounter (Signed)
Scheduled for 10/15.

## 2024-01-02 ENCOUNTER — Ambulatory Visit: Admitting: Neurology

## 2024-01-02 DIAGNOSIS — R202 Paresthesia of skin: Secondary | ICD-10-CM | POA: Diagnosis not present

## 2024-01-02 DIAGNOSIS — G5601 Carpal tunnel syndrome, right upper limb: Secondary | ICD-10-CM

## 2024-01-02 NOTE — Procedures (Signed)
 [facility]  [address] [place], [address]  [place], [place] [address] Tel: [phone] Fax: [phone] Test Date:  01/02/2024  Patient: Thomas Cole DOB: 13-Mar-1971 Physician: Tonita Blanch, DO  Sex: Male Height: 6' 3 Ref Phys: Penne Sharps, MD  ID#: [id number]   Technician:    History: This is a 53 year old man with history of cervical fusion referred for evaluation of bilateral hand numbness and tingling.  NCV & EMG Findings: Extensive electrodiagnostic testing of the right upper extremity and additional studies of the left shows:  Right median sensory response shows prolonged latency (5.3 ms) and reduced amplitude (6.4 V).  Left mixed palmar, left median, and bilateral ulnar motor responses are within normal limits. Right median motor response shows prolonged latency (6.3 ms).  Left median and bilateral ulnar motor responses are within normal limits.   Chronic motor axon loss changes are seen affecting the C6, C7, and C8 myotomes, without accompanying active denervation.    Impression: Right median neuropathy at or distal to the wrist, consistent with a clinical diagnosis of carpal tunnel syndrome.  Overall, these findings are moderate in degree electrically. Chronic multilevel radiculopathies affecting bilateral C6, C7, and C8 myotomes, mild-to-moderate.   ___________________________ Tonita Blanch, DO    Nerve Conduction Studies   Stim Site NR Peak (ms) Norm Peak (ms) O-P Amp (V) Norm O-P Amp  Left Median Anti Sensory (2nd Digit)  32 C  Wrist    3.2 <3.6 26.0 >15  Right Median Anti Sensory (2nd Digit)  32 C  Wrist    *5.3 <3.6 *6.4 >15  Left Ulnar Anti Sensory (5th Digit)  32 C  Wrist    2.9 <3.1 20.8 >10  Right Ulnar Anti Sensory (5th Digit)  32 C  Wrist    2.6 <3.1 14.5 >10     Stim Site NR Onset (ms) Norm Onset (ms) O-P Amp (mV) Norm O-P Amp Site1 Site2 Delta-0 (ms) Dist (cm) Vel (m/s) Norm Vel (m/s)  Left Median Motor (Abd Poll Brev)  32 C   Wrist    3.9 <4.0 9.4 >6 Elbow Wrist 6.3 32.0 51 >50  Elbow    10.2  8.7         Right Median Motor (Abd Poll Brev)  32 C  Wrist    *6.3 <4.0 7.9 >6 Elbow Wrist 5.7 31.0 54 >50  Elbow    12.0  6.9         Left Ulnar Motor (Abd Dig Minimi)  32 C  Wrist    2.6 <3.1 11.4 >7 B Elbow Wrist 4.4 25.0 57 >50  B Elbow    7.0  11.0  A Elbow B Elbow 1.4 10.0 71 >50  A Elbow    8.4  10.8         Right Ulnar Motor (Abd Dig Minimi)  32 C  Wrist    2.4 <3.1 11.7 >7 B Elbow Wrist 3.9 26.0 67 >50  B Elbow    6.3  11.4  A Elbow B Elbow 1.3 10.0 77 >50  A Elbow    7.6  11.2            Stim Site NR Peak (ms) Norm Peak (ms) P-T Amp (V) Site1 Site2 Delta-P (ms) Norm Delta (ms)  Left Median/Ulnar Palm Comparison (Wrist - 8cm)  32 C  Median Palm    1.9 <2.2 22.3 Median Palm Ulnar Palm 0.3   Ulnar Palm    1.6 <2.2 15.3  Electromyography   Side Muscle Ins.Act Fibs Fasc Recrt Amp Dur Poly Activation Comment  Right 1stDorInt Nml Nml Nml *1- *1+ *1+ *1+ Nml N/A  Right Abd Poll Brev Nml Nml Nml Nml Nml Nml Nml Nml N/A  Right PronatorTeres Nml Nml Nml *2- *1+ *1+ *1+ Nml N/A  Right Biceps Nml Nml Nml *2- *1+ *1+ *1+ Nml N/A  Right Triceps Nml Nml Nml *2- *1+ *1+ *1+ Nml N/A  Right Deltoid Nml Nml Nml Nml Nml Nml Nml Nml N/A  Left 1stDorInt Nml Nml Nml *1- *1+ *1+ *1+ Nml N/A  Left PronatorTeres Nml Nml Nml *2- *1+ *1+ *1+ Nml N/A  Left Biceps Nml Nml Nml *2- *1+ *1+ *1+ Nml N/A  Left Triceps Nml Nml Nml *2- *1+ *1+ *1+ Nml N/A  Left Deltoid Nml Nml Nml Nml Nml Nml Nml Nml N/A      Waveforms:

## 2024-01-07 ENCOUNTER — Ambulatory Visit: Payer: Self-pay | Admitting: Neurosurgery

## 2024-01-15 ENCOUNTER — Encounter: Payer: Self-pay | Admitting: Neurosurgery

## 2024-01-15 ENCOUNTER — Ambulatory Visit: Admitting: Neurosurgery

## 2024-01-15 VITALS — BP 130/82 | Ht 75.0 in | Wt 315.0 lb

## 2024-01-15 DIAGNOSIS — M48062 Spinal stenosis, lumbar region with neurogenic claudication: Secondary | ICD-10-CM

## 2024-01-15 NOTE — Progress Notes (Signed)
   DOS: 04/14/23 And 05/28/23  HISTORY OF PRESENT ILLNESS: 01/20/24  [name] is status post lumbar discectomy with repeat decompression.  He is done well from a lumbar spine standpoint.  Every once a while gets some pulling in his back but otherwise has had continued improvement in his gait and ambulation.  He does continue to get some intermittent claudicatory symptoms.  He feels like these have gotten worse.  Since his back pain has improved and has severe radiculopathy has improved he has noticed that has been trying to walk more and does feel some heaviness in his lower extremities.  We recently worked up his neck which showed mostly chronic changes \  PHYSICAL EXAMINATION:   Vitals:   01/15/24 1411  BP: 130/82   General: Patient is well developed, well nourished, calm, collected, and in no apparent distress.  NEUROLOGICAL:  General: In no acute distress.  Awake, alert, oriented to person, place, and time. Pupils equal round and reactive to light.  Strength: Good activation of his bilateral lower extremities proximally and distally.  Incision c/d/i   ROS (Neurologic): Negative except as noted above  IMAGING: We reviewed h his cervical spine findings which show chronic changes.  This is in conjunction with a EMG nerve conduction study as well as cross-sectional imaging which shows no evidence of worsening.  ASSESSMENT/PLAN:  Thomas Cole is here today for evaluation after a lumbar laminectomy.  He originally had a lumbar discectomy and was doing well but that of reherniation syndrome was admitted to the hospital with worsening functional status.  Given his severe inability to ambulate secondary to pain and claudicatory symptoms we took him to the OR for a midline decompression of his lumbar spine.   He has had a significant improvement in his function overall and notices that now he is able to walk a little bit further he does get get claudicatory symptoms in his lower  extremities.  Will plan on getting a repeat MRI to evaluate.  We recently worked up his neck to see if there is anything active that we could help her improve with.  Most of these changes were found to be chronic and without any active ongoing compression.  He does have a history of extensive cervical spine surgery.  Is likely that his symptoms here are mostly just chronic.  His spine disease was extensive, I will plan on reevaluating with a repeat MRI to see if he has developed worsening foraminal stenosis and may require indirect decompression or decompression at the foraminal level.  Should this negative we will plan to ABI to evaluate for any vascular issues for his lower extremities.   Penne MICAEL Sharps, MD/MS Department of Neurosurgery

## 2024-01-20 NOTE — Addendum Note (Signed)
 Addended by: Mariadelaluz Guggenheim W on: 01/20/2024 11:17 AM   Modules accepted: Orders

## 2024-01-26 ENCOUNTER — Ambulatory Visit
Admission: RE | Admit: 2024-01-26 | Discharge: 2024-01-26 | Disposition: A | Discharge status: Home / Self Care | Source: Ambulatory Visit | Attending: Neurosurgery | Admitting: Neurosurgery

## 2024-01-26 DIAGNOSIS — M48062 Spinal stenosis, lumbar region with neurogenic claudication: Secondary | ICD-10-CM | POA: Insufficient documentation

## 2024-01-26 MED ORDER — GADOBUTROL 1 MMOL/ML IV SOLN
10.0000 mL | Freq: Once | INTRAVENOUS | Status: AC | PRN
  Administered 2024-01-26: 10 mL via INTRAVENOUS

## 2024-02-02 ENCOUNTER — Ambulatory Visit: Payer: Self-pay | Admitting: Neurosurgery

## 2024-02-19 ENCOUNTER — Ambulatory Visit (INDEPENDENT_AMBULATORY_CARE_PROVIDER_SITE_OTHER): Admitting: Neurosurgery

## 2024-02-19 ENCOUNTER — Encounter: Payer: Self-pay | Admitting: Neurosurgery

## 2024-02-19 VITALS — BP 132/84 | Wt 302.0 lb

## 2024-02-19 DIAGNOSIS — M5416 Radiculopathy, lumbar region: Secondary | ICD-10-CM | POA: Diagnosis not present

## 2024-02-19 DIAGNOSIS — M961 Postlaminectomy syndrome, not elsewhere classified: Secondary | ICD-10-CM | POA: Diagnosis not present

## 2024-02-19 DIAGNOSIS — G894 Chronic pain syndrome: Secondary | ICD-10-CM

## 2024-02-19 DIAGNOSIS — G8929 Other chronic pain: Secondary | ICD-10-CM

## 2024-02-19 NOTE — Patient Instructions (Signed)

## 2024-02-19 NOTE — Progress Notes (Signed)
 DOS: 04/14/23 And 05/28/23  HISTORY OF PRESENT ILLNESS: 02/19/24  Discussed the use of AI scribe software for clinical note transcription with the patient, who gave verbal consent to proceed.  History of Present Illness Thomas Cole is a 53 year old male who presents for evaluation of a spinal cord stimulator trial. He experiences chronic back pain radiating into his legs, significantly impacting daily activities and limiting mobility. Multiple decompression surgeries have not provided lasting relief. Degeneration is present at the L2-3 level above the previous surgical site.  PHYSICAL EXAMINATION:   Vitals:   02/19/24 1410  BP: 132/84   General: Patient is well developed, well nourished, calm, collected, and in no apparent distress.  NEUROLOGICAL:  General: In no acute distress.  Awake, alert, oriented to person, place, and time. Pupils equal round and reactive to light.  Strength: Good activation of his bilateral lower extremities proximally and distally.  Incision c/d/i   ROS (Neurologic): Negative except as noted above  IMAGING:  Narrative & Impression  EXAM: MR Lumbar Spine With and Without Intravenous Contrast. 01/26/2024 03:50:57 PM   TECHNIQUE: Multiplanar multisequence MRI of the lumbar spine was performed with and without the administration of intravenous contrast.   CONTRAST: 10 mL of Gadavist.   COMPARISON: MR Lumbar Spine 05/25/2023.   CLINICAL HISTORY: Lumbar claudication, chronic lower back pain, 3 prior surgeries, bilateral leg pain and weakness left>right.   FINDINGS:   BONES AND ALIGNMENT: Normal vertebral body heights. Normal bone marrow signal. No abnormal enhancement. Normal alignment. Status post remote left laminectomy at L5-S1. Status post bilateral laminectomies at L3 and L4.   SPINAL CORD: The conus medullaris terminates at the upper body of L1.   SOFT TISSUES: No acute abnormality.   L1-L2: No disc herniation. No spinal canal  stenosis or neural foraminal narrowing.   L2-L3: Broad-based disc bulge. Bilateral facet hypertrophy. Moderate to severe central spinal canal stenosis. Bilateral lateral recess stenosis with apparent impingement of the L3 nerves in the lateral recesses bilaterally.   L3-L4: Status post bilateral laminectomies. Interval improvement of central spinal canal stenosis and lateral recess stenosis secondary to the decompression. Broad-based disc bulging. Mild-to-moderate central spinal canal stenosis. Bilateral lateral recess stenosis without definite nerve root impingement.   L4-L5: Status post bilateral laminectomies. Interval improvement of central spinal canal stenosis and lateral recess stenosis secondary to the decompression. Broad-based disc bulging. Mild-to-moderate central spinal canal stenosis. Bilateral lateral recess stenosis without definite nerve root impingement.   L5-S1: Status post remote left laminectomy. Disc space narrowing. Diffuse disc bulging. Bilateral facet hypertrophy. Mild central spinal canal stenosis. Bilateral lateral recess stenosis without apparent nerve root impingement.   IMPRESSION: 1. Status post bilateral L3L4 laminectomies with interval improvement of central canal and lateral recess stenosis at these levels; residual mild-to-moderate central canal and bilateral lateral recess stenosis without definite nerve root impingement. 2. L2L3: Moderate to severe central canal and bilateral lateral recess stenosis with apparent impingement of the L3 nerve roots in the lateral recesses bilaterally. 3. L5S1: Mild central canal and bilateral lateral recess stenosis without apparent nerve root impingement.   Electronically signed by: Evalene Coho MD 01/31/2024 11:08 AM EDT RP Workstation: [id number]      ASSESSMENT/PLAN:  Thomas Cole is here today for evaluation after a lumbar laminectomy.  He originally had a lumbar discectomy and was doing well but  that of reherniation syndrome was admitted to the hospital with worsening functional status.  Given his severe inability to ambulate secondary to pain and  claudicatory symptoms we took him to the OR for a midline decompression of his lumbar spine.  He does continue to have gotten chronic radicular symptoms as well as chronic back pain and lower extremity pain.  He is being seen again today for evaluation.  We repeated the MRI which demonstrated some continued stenosis/worsening stenosis at L2-3.  This likely causing some of his symptoms.  We did discuss that with his chronic pain syndrome, chronic low back pain, and failed back syndrome that he may benefit from spinal cord stimulation evaluation.  He has been in discussion with the [facility] team about the benefits of spinal cord stimulation and was here today to discuss that further.  I do think he would be a good candidate for evaluation.  He has chronic pain, failed back syndrome, and chronic lower extremity issues.  I do feel he would benefit from a trial and we will plan to make a referral to the pain team to see whether or not they will agree with this plan.  I will plan on getting an MRI of his thoracic spine to evaluate for thoracic stenosis which may preclude him from treatment trial placement, will also make a referral for psychology.  Penne MICAEL Sharps, MD  Department of Neurosurgery

## 2024-02-21 ENCOUNTER — Ambulatory Visit
Admission: RE | Admit: 2024-02-21 | Discharge: 2024-02-21 | Disposition: A | Discharge status: Home / Self Care | Source: Ambulatory Visit | Attending: Neurosurgery | Admitting: Neurosurgery

## 2024-02-21 DIAGNOSIS — G8929 Other chronic pain: Secondary | ICD-10-CM | POA: Insufficient documentation

## 2024-02-21 DIAGNOSIS — G894 Chronic pain syndrome: Secondary | ICD-10-CM | POA: Insufficient documentation

## 2024-02-21 DIAGNOSIS — M961 Postlaminectomy syndrome, not elsewhere classified: Secondary | ICD-10-CM | POA: Insufficient documentation

## 2024-02-21 DIAGNOSIS — M5416 Radiculopathy, lumbar region: Secondary | ICD-10-CM | POA: Insufficient documentation

## 2024-02-24 ENCOUNTER — Telehealth: Payer: Self-pay

## 2024-02-24 NOTE — Telephone Encounter (Signed)
 [facility] said Dr. Claudene wants you to eval this patient for an scs trial. I dont see any documentation but that's what she said

## 2024-02-26 ENCOUNTER — Ambulatory Visit: Payer: Self-pay | Admitting: Neurosurgery

## 2024-02-29 DIAGNOSIS — Z79899 Other long term (current) drug therapy: Secondary | ICD-10-CM | POA: Insufficient documentation

## 2024-03-02 ENCOUNTER — Ambulatory Visit: Attending: Nurse Practitioner | Admitting: Nurse Practitioner

## 2024-03-02 ENCOUNTER — Encounter: Payer: Self-pay | Admitting: Nurse Practitioner

## 2024-03-02 VITALS — BP 137/90 | HR 87 | Temp 97.3°F | Resp 20 | Ht 74.0 in | Wt 300.0 lb

## 2024-03-02 DIAGNOSIS — M5412 Radiculopathy, cervical region: Secondary | ICD-10-CM | POA: Diagnosis present

## 2024-03-02 DIAGNOSIS — G8929 Other chronic pain: Secondary | ICD-10-CM | POA: Diagnosis present

## 2024-03-02 DIAGNOSIS — G894 Chronic pain syndrome: Secondary | ICD-10-CM | POA: Insufficient documentation

## 2024-03-02 DIAGNOSIS — Z79899 Other long term (current) drug therapy: Secondary | ICD-10-CM | POA: Diagnosis present

## 2024-03-02 DIAGNOSIS — Z9889 Other specified postprocedural states: Secondary | ICD-10-CM | POA: Diagnosis present

## 2024-03-02 DIAGNOSIS — M5414 Radiculopathy, thoracic region: Secondary | ICD-10-CM | POA: Insufficient documentation

## 2024-03-02 DIAGNOSIS — G959 Disease of spinal cord, unspecified: Secondary | ICD-10-CM | POA: Insufficient documentation

## 2024-03-02 DIAGNOSIS — M5416 Radiculopathy, lumbar region: Secondary | ICD-10-CM | POA: Insufficient documentation

## 2024-03-02 DIAGNOSIS — M960 Pseudarthrosis after fusion or arthrodesis: Secondary | ICD-10-CM | POA: Diagnosis present

## 2024-03-02 MED ORDER — NALOXONE HCL 4 MG/0.1ML NA LIQD: 1.0000 | NASAL | 1 refills | Status: AC | PRN

## 2024-03-02 MED ORDER — HYDROCODONE-ACETAMINOPHEN 10-325 MG PO TABS: 1.0000 | ORAL_TABLET | Freq: Three times a day (TID) | ORAL | 0 refills | Status: DC | PRN

## 2024-03-02 NOTE — Progress Notes (Signed)
 Nursing Pain Medication Assessment:  Safety precautions to be maintained throughout the outpatient stay will include: orient to surroundings, keep bed in low position, maintain call bell within reach at all times, provide assistance with transfer out of bed and ambulation.  Medication Inspection Compliance: Pill count conducted under aseptic conditions, in front of the patient. Neither the pills nor the bottle was removed from the patient's sight at any time. Once count was completed pills were immediately returned to the patient in their original bottle.  Medication: Hydrocodone /APAP Pill/Patch Count: 56 of 90 pills/patches remain Pill/Patch Appearance: Markings consistent with prescribed medication Bottle Appearance: Standard pharmacy container. Clearly labeled. Filled Date: 57 / 25 / 2025 Last Medication intake:  Today

## 2024-03-02 NOTE — Progress Notes (Signed)
 PROVIDER NOTE: Interpretation of information contained herein should be left to medically-trained personnel. Specific patient instructions are provided elsewhere under Patient Instructions section of medical record. This document was created in part using AI and STT-dictation technology, any transcriptional errors that may result from this process are unintentional.  Patient: Thomas Cole  Service: E/M   PCP: Diedra Lame, MD  DOB: Mar 26, 1971  DOS: 03/02/2024  Provider: Emmy MARLA Blanch, NP  MRN: [id number]  Delivery: Face-to-face  Specialty: Interventional Pain Management  Type: Established Patient  Setting: Ambulatory outpatient facility  Specialty designation: 09  Referring Prov.: Diedra Lame, MD  Location: Outpatient office facility       History of present illness (HPI) [name], a 53 y.o. year old male, is here today because of his Chronic radicular lumbar pain [M54.16, G89.29]. [name] primary complain today is Back Pain (low)  Pertinent problems: [name] has Sleep apnea; Spondylosis, cervical, with myelopathy; Cervicalgia; Obesity (BMI 30-39.9); History of seizures; GERD without esophagitis; Depression; SOB (shortness of breath); PTSD (post-traumatic stress disorder); Generalized anxiety disorder; Major depressive disorder, recurrent episode, severe (HCC); Localization-related symptomatic epilepsy and epileptic syndromes with complex partial seizures, not intractable, without status epilepticus (HCC); H/O cervical spinal arthrodesis; Myelopathy of cervical spinal cord with cervical radiculopathy (HCC); Bilateral occipital neuralgia; Degeneration of cervical intervertebral disc with myelopathy; Thoracic radiculopathy; Failed cervical fusion; Chronic pain syndrome; Thoracic facet joint syndrome; Spinal stenosis, lumbar region, with neurogenic claudication; Chronic radicular lumbar pain; Neuroforaminal stenosis of lumbar spine (L3,4,5); Cervical radicular pain; History of lumbar  surgery (1994 L4/5 discetomy); Spinal stenosis of lumbar region with radiculopathy; Diabetes mellitus without complication (HCC); Foraminal stenosis due to intervertebral disc disease; Weakness of right lower extremity; Spinal stenosis; Intervertebral lumbar disc disorder with myelopathy, lumbar region; Intractable low back pain; Parkinsonism (HCC); Type 2 diabetes mellitus with peripheral neuropathy (HCC); and Intractable back pain on their pertinent problem list.  Pain Assessment: Severity of Chronic pain is reported as a 7 /10. Location:   Lower/denies. Onset: More than a month ago. Quality: Numbness, Sharp. Timing: Constant. Modifying factor(s): meds. Vitals:  height is 6' 2 (1.88 m) and weight is 300 lb (136.1 kg). His temperature is 97.3 F (36.3 C) (abnormal). His blood pressure is 137/90 (abnormal) and his pulse is 87. His respiration is 20 and oxygen saturation is 100%.  BMI: Estimated body mass index is 38.52 kg/m as calculated from the following:   Height as of this encounter: 6' 2 (1.88 m).   Weight as of this encounter: 300 lb (136.1 kg).  Last encounter: 12/03/2023. Last procedure: Visit date not found.  Reason for encounter: medication management. No change in medical history since last visit.  Patient's pain is at baseline.  Patient continues multimodal pain regimen as prescribed.  States that it provides pain relief and improvement in functional status.   Discussed the use of AI scribe software for clinical note transcription with the patient, who gave verbal consent to proceed.  History of Present Illness   Thomas Cole is a 53 year old male who presents with worsening low back pain radiating to the left leg.  He has been experiencing worsening low back pain that radiates down his left leg, causing numbness. The pain is exacerbated by prolonged standing and sitting.  An MRI was performed recently. He is currently taking hydrocodone  10-325 mg, with no reported side effects or  adverse reactions.     Pharmacotherapy Assessment   Analgesic: Hydrocodone -acetaminophen  (Norco) 10-325 mg tablet  every 8 hours as needed for pain. MME=30 Monitoring: [place] PMP: PDMP reviewed during this encounter.       Pharmacotherapy: No side-effects or adverse reactions reported. Compliance: No problems identified. Effectiveness: Clinically acceptable.  Rebecka Wolm HERO, RN  03/02/2024  1:21 PM  Sign when Signing Visit Nursing Pain Medication Assessment:  Safety precautions to be maintained throughout the outpatient stay will include: orient to surroundings, keep bed in low position, maintain call bell within reach at all times, provide assistance with transfer out of bed and ambulation.  Medication Inspection Compliance: Pill count conducted under aseptic conditions, in front of the patient. Neither the pills nor the bottle was removed from the patient's sight at any time. Once count was completed pills were immediately returned to the patient in their original bottle.  Medication: Hydrocodone /APAP Pill/Patch Count: 56 of 90 pills/patches remain Pill/Patch Appearance: Markings consistent with prescribed medication Bottle Appearance: Standard pharmacy container. Clearly labeled. Filled Date: 77 / 36 / 2025 Last Medication intake:  Today    UDS:  Summary  Date Value Ref Range Status  08/01/2023 FINAL  Final    Comment:    ==================================================================== [employer] [facility] (MW) ==================================================================== Test                             Result       Flag       Units  Drug Present and Declared for Prescription Verification   7-aminoclonazepam              268          EXPECTED   ng/mg creat    7-aminoclonazepam is an expected metabolite of clonazepam . Source of    clonazepam  is a scheduled prescription medication.    Hydrocodone                     1347         EXPECTED   ng/mg creat   Hydromorphone                    141          EXPECTED   ng/mg creat   Dihydrocodeine                 270          EXPECTED   ng/mg creat   Norhydrocodone                 934          EXPECTED   ng/mg creat    Sources of hydrocodone  include scheduled prescription medications.    Hydromorphone , dihydrocodeine and norhydrocodone are expected    metabolites of hydrocodone . Hydromorphone  and dihydrocodeine are    also available as scheduled prescription medications.  ==================================================================== Test                      Result    Flag   Units      Ref Range   Creatinine              73               mg/dL      >=79 ==================================================================== Declared Medications:  The flagging and interpretation on this report are based on the  following declared medications.  Unexpected results may arise from  inaccuracies in the declared medications.   **Note: The testing scope  of this panel includes these medications:   Clonazepam  (Klonopin )  Hydrocodone  (Norco)   **Note: The testing scope of this panel does not include the  following reported medications:   Acetaminophen  (Norco)  Carbidopa  (Sinemet )  Duloxetine  (Cymbalta )  Furosemide  (Lasix )  Hydrochlorothiazide   Lamotrigine  (Lamictal )  Levodopa  (Sinemet )  Losartan  (Cozaar )  Metformin (Glucophage)  Metoprolol  (Toprol )  Nortriptyline  (Pamelor )  Omeprazole  (Prilosec)  Pregabalin  (Lyrica )  Sildenafil (Viagra)  Tizanidine  (Zanaflex )  Vitamin B12  Vitamin D3 ==================================================================== For clinical consultation, please call [phone]. ====================================================================     No results found for: CBDTHCR No results found for: D8THCCBX No results found for: D9THCCBX  ROS  Constitutional: Denies any fever or chills Gastrointestinal: No reported hemesis, hematochezia, vomiting, or acute GI  distress Musculoskeletal: low back pain Neurological: No reported episodes of acute onset apraxia, aphasia, dysarthria, agnosia, amnesia, paralysis, loss of coordination, or loss of consciousness  Medication Review  DULoxetine , HYDROcodone -acetaminophen , Vitamin D3, carbidopa -levodopa , clonazePAM , cyanocobalamin , furosemide , hydrochlorothiazide , lamoTRIgine , losartan , metFORMIN, metoprolol  succinate, naloxone, nortriptyline , omeprazole , prazosin, pregabalin , sildenafil, and tiZANidine   History Review  Allergy: Mr. Burbach is allergic to amitriptyline, bee venom, chlorhexidine, carbamazepine, hm lidocaine  patch [lidocaine ], meloxicam, morphine  and codeine, and sulfa antibiotics. Drug: Mr. Ishmael  reports no history of drug use. Alcohol:  reports no history of alcohol use. Tobacco:  reports that he has never smoked. He has never used smokeless tobacco. Social: Mr. Malizia  reports that he has never smoked. He has never used smokeless tobacco. He reports that he does not drink alcohol and does not use drugs. Medical:  has a past medical history of Anxiety, Asthma, Back problem, Benign essential hypertension (11/30/2016), Cervical spondylosis without myelopathy (09/17/2012), Complication of anesthesia, Depression, Diabetes mellitus without complication (HCC) (02/2020), GERD (gastroesophageal reflux disease), Hypertension, Labile hypertension (03/19/2013), Major depressive disorder, recurrent episode, severe (HCC) (06/23/2015), Migraine headache, Migraines (11/11/2013), S/P appendectomy (04/09/2011), Seizure (HCC) (11/30/2016), Seizures (HCC), Shortness of breath, and SOB (shortness of breath) (08/06/2014). Surgical: Mr. Ewbank  has a past surgical history that includes Knee surgery; Posterior fusion cervical spine; Cervical disc surgery; Appendectomy (12); Anterior cervical corpectomy (N/A, 09/15/2012); Cholecystectomy (N/A, 03/29/2017); Colonoscopy with propofol  (N/A, 10/23/2019); Esophagogastroduodenoscopy (egd) with  propofol  (N/A, 10/23/2019); Lumbar laminectomy/decompression microdiscectomy (Right, 04/14/2023); and Decompressive lumbar laminectomy level 2 (Bilateral, 05/28/2023). Family: family history includes Arthritis in his father, maternal grandfather, maternal grandmother, mother, paternal grandfather, and paternal grandmother; Asthma in his mother; Coronary artery disease in his father and mother; Diabetes in his father, paternal grandfather, and paternal grandmother; Heart disease in his father, maternal grandfather, maternal grandmother, paternal grandfather, and paternal grandmother; Hypertension in his maternal grandfather, maternal grandmother, mother, paternal grandfather, and paternal grandmother; Mental illness in his mother.  Laboratory Chemistry Profile   Renal Lab Results  Component Value Date   BUN 16 07/08/2023   CREATININE 1.13 07/08/2023   LABCREA 79.3 03/05/2014   GFR 82.19 08/05/2014   GFRAA >60 04/21/2017   GFRNONAA >60 07/08/2023    Hepatic Lab Results  Component Value Date   AST 30 04/21/2017   ALT 23 04/21/2017   ALBUMIN  4.2 04/21/2017   ALKPHOS 86 04/21/2017   LIPASE 51 04/21/2017    Electrolytes Lab Results  Component Value Date   NA 135 07/08/2023   K 4.1 07/08/2023   CL 99 07/08/2023   CALCIUM 9.5 07/08/2023   MG 2.0 05/31/2023    Bone Lab Results  Component Value Date   VD25OH 31.95 03/05/2016   25OHVITD1 35 04/26/2020   25OHVITD2 <1.0 04/26/2020  25OHVITD3 35 04/26/2020    Inflammation (CRP: Acute Phase) (ESR: Chronic Phase) Lab Results  Component Value Date   CRP 10 04/26/2020   ESRSEDRATE 22 04/26/2020         Note: Above Lab results reviewed.  Recent Imaging Review  MR THORACIC SPINE WO CONTRAST CLINICAL DATA:  Ongoing back pain. Spinal cord injury in 2011. Evaluation for spinal cord stimulator placement. Failed back surgical syndrome.  EXAM: MRI THORACIC SPINE WITHOUT CONTRAST  TECHNIQUE: Multiplanar, multisequence MR imaging of the  thoracic spine was performed. No intravenous contrast was administered.  COMPARISON:  Thoracic study 04/28/2021  FINDINGS: Alignment:  No thoracic malalignment.  Vertebrae: No thoracic region fracture or focal bone lesion of significance. Hemangioma noted within the T2 vertebral body. Anterior bridging osteophytes in the mid to lower thoracic region suggesting possible diffuse idiopathic skeletal hyperostosis. These are anterior and lateral osteophytes. None encroach upon the spinal canal.  Cord:  No cord compression or focal cord lesion.  Paraspinal and other soft tissues: Negative  Disc levels:  Cervical fusion down to the C7 level.  T1-2: Mild bulging of the disc. Bilateral facet degeneration and hypertrophy. No canal stenosis. Mild bilateral foraminal narrowing.  T2-3: Minimal bulging of the disc. Mild facet and ligamentous hypertrophy. Mild canal narrowing but no compression of the cord. Foramina appear sufficiently patent.  T3-4: Bulging of the disc more prominent towards the right. Narrowing of the ventral subarachnoid space but no compression of the cord. No foraminal stenosis.  T4-5: Unremarkable interspace.  T5-6: Unremarkable interspace.  T6-7: Minimal disc bulge.  No stenosis.  T7-8: Unremarkable interspace.  T8-9 through T12-L1: Negative. No degenerative change that would affect spinal cord stimulator placement. Anterior and lateral bridging osteophytes as above, suggesting dish.  IMPRESSION: 1. No degenerative change that would affect spinal cord stimulator placement. 2. Anterior and lateral bridging osteophytes in the mid to lower thoracic region suggesting diffuse idiopathic skeletal hyperostosis. 3. Degenerative disc disease and degenerative facet disease at T1-2, T2-3 and T3-4. No definite compressive stenosis of the canal or foramina.  Electronically Signed   By: Oneil Officer M.D.   On: 02/25/2024 14:53 Note: Reviewed        Physical Exam   Vitals: BP (!) 137/90   Pulse 87   Temp (!) 97.3 F (36.3 C)   Resp 20   Ht 6' 2 (1.88 m)   Wt 300 lb (136.1 kg)   SpO2 100%   BMI 38.52 kg/m  BMI: Estimated body mass index is 38.52 kg/m as calculated from the following:   Height as of this encounter: 6' 2 (1.88 m).   Weight as of this encounter: 300 lb (136.1 kg). Ideal: Ideal body weight: 82.2 kg (181 lb 3.5 oz) Adjusted ideal body weight: 103.8 kg (228 lb 11.7 oz) General appearance: Well nourished, well developed, and well hydrated. In no apparent acute distress Mental status: Alert, oriented x 3 (person, place, & time)       Respiratory: No evidence of acute respiratory distress Eyes: PERLA  Musculoskeletal: +LBP Assessment   Diagnosis Status  1. Chronic radicular lumbar pain   2. Medication management   3. Chronic pain syndrome   4. Thoracic radiculopathy   5. Myelopathy of cervical spinal cord with cervical radiculopathy (HCC)   6. History of lumbar surgery (1994 L4/5 discetomy)   7. Cervical radicular pain   8. Failed cervical fusion    Controlled Controlled Controlled   Updated Problems: No problems updated.  Plan of Care  Problem-specific:  Assessment and Plan    Chronic low back pain with left leg radiculopathy Chronic low back pain with increased severity and left leg radiculopathy. MRI shows nerve irritation. Considering SCS trial for pain management. Psychological evaluation required before proceeding. He is researching SCS efficacy, limitations, and cost-effectiveness. - Proceed with psychological evaluation scheduled for Thursday. - Research spinal cord stimulator (SCS) trial efficacy, limitations, and cost-effectiveness. - Coordinate with Dr. Lateef for SCS trial if psychological evaluation is favorable.  Chronic opioid therapy for pain management Continued hydrocodone  10-325 mg use for pain management without side effects. - Sent prescription for hydrocodone  10-325 mg to [facility].   Patient's pain is controlled with hydrocodone , will continue on current medication regimen.  Prescribing drug monitoring (PDMP) reviewed, findings consistent with use of prescribed medication and no evidence of narcotic misuse or abuse. Urine drug screening (UDS) up to date.  No side effects or adverse reaction reported to medication.  Schedule follow-up in 90 days for medication management.      Mr. LOVELL NUTTALL has a current medication list which includes the following long-term medication(s): carbidopa -levodopa , carbidopa -levodopa , clonazepam , duloxetine , lamotrigine , metformin, metoprolol  succinate, nortriptyline , prazosin, pregabalin , sildenafil, [START ON 03/11/2024] hydrocodone -acetaminophen , [START ON 04/10/2024] hydrocodone -acetaminophen , and [START ON 05/10/2024] hydrocodone -acetaminophen .  Pharmacotherapy (Medications Ordered): Meds ordered this encounter  Medications   HYDROcodone -acetaminophen  (NORCO) 10-325 MG tablet    Sig: Take 1 tablet by mouth every 8 (eight) hours as needed. For chronic pain syndrome. Each Rx to last 30 days.    Dispense:  90 tablet    Refill:  0   HYDROcodone -acetaminophen  (NORCO) 10-325 MG tablet    Sig: Take 1 tablet by mouth every 8 (eight) hours as needed. For chronic pain syndrome. Each Rx to last 30 days.    Dispense:  90 tablet    Refill:  0   HYDROcodone -acetaminophen  (NORCO) 10-325 MG tablet    Sig: Take 1 tablet by mouth every 8 (eight) hours as needed. For chronic pain syndrome. Each Rx to last 30 days.    Dispense:  90 tablet    Refill:  0   naloxone (NARCAN) nasal spray 4 mg/0.1 mL    Sig: Place 1 spray into the nose as needed for up to 365 doses (for opioid-induced respiratory depresssion). In case of emergency (overdose), spray once into each nostril. If no response within 3 minutes, repeat application and call 911.    Dispense:  1 each    Refill:  1    Instruct patient in proper use of device.   Orders:  No orders of the defined types  were placed in this encounter.       Return in about 3 months (around 05/31/2024) for (F2F), (MM), Emmy Blanch NP.    Recent Visits Date Type Provider Dept  12/03/23 Office Visit Abijah Roussel K, NP [facility]  Showing recent visits within past 90 days and meeting all other requirements Today's Visits Date Type Provider Dept  03/02/24 Office Visit Debra Calabretta K, NP [facility]  Showing today's visits and meeting all other requirements Future Appointments Date Type Provider Dept  05/28/24 Appointment Kay Ricciuti K, NP [facility]  Showing future appointments within next 90 days and meeting all other requirements  I discussed the assessment and treatment plan with the patient. The patient was provided an opportunity to ask questions and all were answered. The patient agreed with the plan and demonstrated an understanding of the instructions.  Patient advised to call  back or seek an in-person evaluation if the symptoms or condition worsens.  I personally spent a total of 30 minutes in the care of the patient today including preparing to see the patient, getting/reviewing separately obtained history, performing a medically appropriate exam/evaluation, counseling and educating, placing orders, referring and communicating with other health care professionals, documenting clinical information in the EHR, independently interpreting results, communicating results, and coordinating care.   Note by: Khadijah Mastrianni K Ileane Sando, NP (TTS and AI technology used. I apologize for any typographical errors that were not detected and corrected.) Date: 03/02/2024; Time: 2:36 PM

## 2024-04-14 ENCOUNTER — Ambulatory Visit: Admitting: Student in an Organized Health Care Education/Training Program

## 2024-04-14 ENCOUNTER — Encounter: Payer: Self-pay | Admitting: Student in an Organized Health Care Education/Training Program

## 2024-04-14 ENCOUNTER — Ambulatory Visit
Admission: RE | Admit: 2024-04-14 | Discharge: 2024-04-14 | Disposition: A | Discharge status: Home / Self Care | Source: Ambulatory Visit | Attending: Student in an Organized Health Care Education/Training Program | Admitting: Student in an Organized Health Care Education/Training Program

## 2024-04-14 VITALS — BP 118/77 | HR 85 | Temp 97.0°F | Resp 16 | Ht 75.0 in | Wt 300.0 lb

## 2024-04-14 DIAGNOSIS — M5416 Radiculopathy, lumbar region: Secondary | ICD-10-CM

## 2024-04-14 DIAGNOSIS — M961 Postlaminectomy syndrome, not elsewhere classified: Secondary | ICD-10-CM | POA: Insufficient documentation

## 2024-04-14 DIAGNOSIS — M5412 Radiculopathy, cervical region: Secondary | ICD-10-CM | POA: Insufficient documentation

## 2024-04-14 DIAGNOSIS — G894 Chronic pain syndrome: Secondary | ICD-10-CM

## 2024-04-14 DIAGNOSIS — G8929 Other chronic pain: Secondary | ICD-10-CM | POA: Insufficient documentation

## 2024-04-14 NOTE — Patient Instructions (Addendum)
 [facility] brochure with my card Soap scrub [employer] brochure   Procedure instructions  Do not eat or drink fluids (other than water) for 6 hours before your procedure  No water for 2 hours before your procedure  Take your blood pressure medicine with a sip of water  Arrive 30 minutes before your appointment  Carefully read the Preparing for your procedure detailed instructions  If you have questions call us  at [phone]  _____________________________________________________________________    ______________________________________________________________________  Preparing for your procedure  Appointments: If you think you may not be able to keep your appointment, call 24-48 hours in advance to cancel. We need time to make it available to others.  During your procedure appointment there will be: No Prescription Refills. No disability issues to discussed. No medication changes or discussions.  Instructions: Food intake: Avoid eating anything solid for at least 8 hours prior to your procedure. Clear liquid intake: You may take clear liquids such as water up to 2 hours prior to your procedure. (No carbonated drinks. No soda.) Transportation: Unless otherwise stated by your physician, bring a driver. Morning Medicines: Except for blood thinners, take all of your other morning medications with a sip of water. Make sure to take your heart and blood pressure medicines. If your blood pressure's lower number is above 100, the case will be rescheduled. Blood thinners: Make sure to stop your blood thinners as instructed.  If you take a blood thinner, but were not instructed to stop it, call our office [phone] and ask to talk to a nurse. Not stopping a blood thinner prior to certain procedures could lead to serious complications. Diabetics on insulin : Notify the staff so that you can be scheduled 1st case in the morning. If your diabetes requires high dose insulin , take  only  of your normal insulin  dose the morning of the procedure and notify the staff that you have done so. Preventing infections: Shower with an antibacterial soap the morning of your procedure.  Build-up your immune system: Take 1000 mg of Vitamin C with every meal (3 times a day) the day prior to your procedure. Antibiotics: Inform the nursing staff if you are taking any antibiotics or if you have any conditions that may require antibiotics prior to procedures. (Example: recent joint implants)   Pregnancy: If you are pregnant make sure to notify the nursing staff. Not doing so may result in injury to the fetus, including death.  Sickness: If you have a cold, fever, or any active infections, call and cancel or reschedule your procedure. Receiving steroids while having an infection may result in complications. Arrival: You must be in the facility at least 30 minutes prior to your scheduled procedure. Tardiness: Your scheduled time is also the cutoff time. If you do not arrive at least 15 minutes prior to your procedure, you will be rescheduled.  Children: Do not bring any children with you. Make arrangements to keep them home. Dress appropriately: There is always a possibility that your clothing may get soiled. Avoid long dresses. Valuables: Do not bring any jewelry or valuables.  Reasons to call and reschedule or cancel your procedure: (Following these recommendations will minimize the risk of a serious complication.) Surgeries: Avoid having procedures within 2 weeks of any surgery. (Avoid for 2 weeks before or after any surgery). Flu Shots: Avoid having procedures within 2 weeks of a flu shots or . (Avoid for 2 weeks before or after immunizations). Barium: Avoid having a procedure within 7-10 days after  having had a radiological study involving the use of radiological contrast. (Myelograms, Barium swallow or enema study). Heart attacks: Avoid any elective procedures or surgeries for the initial 6  months after a Myocardial Infarction (Heart Attack). Blood thinners: It is imperative that you stop these medications before procedures. Let us  know if you if you take any blood thinner.  Infection: Avoid procedures during or within two weeks of an infection (including chest colds or gastrointestinal problems). Symptoms associated with infections include: Localized redness, fever, chills, night sweats or profuse sweating, burning sensation when voiding, cough, congestion, stuffiness, runny nose, sore throat, diarrhea, nausea, vomiting, cold or Flu symptoms, recent or current infections. It is specially important if the infection is over the area that we intend to treat. Heart and lung problems: Symptoms that may suggest an active cardiopulmonary problem include: cough, chest pain, breathing difficulties or shortness of breath, dizziness, ankle swelling, uncontrolled high or unusually low blood pressure, and/or palpitations. If you are experiencing any of these symptoms, cancel your procedure and contact your primary care physician for an evaluation.  Remember:  Regular Business hours are:  Monday to Thursday 8:00 AM to 4:00 PM  Provider's Schedule: Eric Como, MD:  Procedure days: Tuesday and Thursday 7:30 AM to 4:00 PM  Wallie Sherry, MD:  Procedure days: Monday and Wednesday 7:30 AM to 4:00 PM

## 2024-04-14 NOTE — Progress Notes (Signed)
 PROVIDER NOTE: Interpretation of information contained herein should be left to medically-trained personnel. Specific patient instructions are provided elsewhere under Patient Instructions section of medical record. This document was created in part using AI and STT-dictation technology, any transcriptional errors that may result from this process are unintentional.  Patient: Thomas Cole  Service: E/M   PCP: Diedra Lame, MD  DOB: May 20, 1970  DOS: 04/14/2024  Provider: Wallie Sherry, MD  MRN: [id number]  Delivery: Face-to-face  Specialty: Interventional Pain Management  Type: Established Patient  Setting: Ambulatory outpatient facility  Specialty designation: 09  Referring Prov.: Diedra Lame, MD  Location: Outpatient office facility       History of present illness (HPI) Thomas Cole, a 54 y.o. year old male, is here today because of his Cervical radicular pain [M54.12]. Thomas Cole primary complain today is Neck Pain  Pertinent problems: Thomas Cole has Spondylosis, cervical, with myelopathy; Migraines; History of seizures; Generalized anxiety disorder; and Bilateral occipital neuralgia on their pertinent problem list.  Pain Assessment: Severity of Chronic pain is reported as a 7 /10. Location: Neck Right, Left/shoulders down arms to hands bilateral. Onset: More than a month ago. Quality: Aching, Sharp, Discomfort. Timing: Constant. Modifying factor(s): rest,. Vitals:  height is 6' 3 (1.905 m) and weight is 300 lb (136.1 kg). His temperature is 97 F (36.1 C) (abnormal). His blood pressure is 118/77 and his pulse is 85. His respiration is 16 and oxygen saturation is 99%.  BMI: Estimated body mass index is 37.5 kg/m as calculated from the following:   Height as of this encounter: 6' 3 (1.905 m).   Weight as of this encounter: 300 lb (136.1 kg).  Last encounter: 05/09/2023. Last procedure: Visit date not found.  Reason for encounter:   Patient presents with cervical spine pain  which radiates down to his shoulders and down to bilateral hands.  He has a history of cervical spinal fusion.  Of note he has tried cervical epidural injections, trigger point injections as well as occipital nerve blocks with limited benefit.  He also endorses low back pain with radiation into bilateral legs.  He states that his back pain has improved since he has lost weight.  He is being more mindful of his diet and eating less junk food he has a history of lumbar laminectomy bilaterally at L3-L4 and L4-L5 as well as a remote left laminectomy L5-S1.  He has been in communication with the [employer]   ROS  Constitutional: Denies any fever or chills Gastrointestinal: No reported hemesis, hematochezia, vomiting, or acute GI distress Musculoskeletal: Denies any acute onset joint swelling, redness, loss of ROM, or weakness Neurological: Cervical radicular pain, lumbar radicular pain  Medication Review  DULoxetine , HYDROcodone -acetaminophen , Vitamin D3, carbidopa -levodopa , clonazePAM , cyanocobalamin , furosemide , hydrochlorothiazide , lamoTRIgine , losartan , metFORMIN, metoprolol  succinate, naloxone , nortriptyline , omeprazole , prazosin, pregabalin , sildenafil, and tiZANidine   History Review  Allergy: Thomas Cole is allergic to amitriptyline, bee venom, chlorhexidine, carbamazepine, hm lidocaine  patch [lidocaine ], meloxicam, morphine  and codeine, and sulfa antibiotics. Drug: Thomas Cole  reports no history of drug use. Alcohol:  reports no history of alcohol use. Tobacco:  reports that he has never smoked. He has never used smokeless tobacco. Social: Thomas Cole  reports that he has never smoked. He has never used smokeless tobacco. He reports that he does not drink alcohol and does not use drugs. Medical:  has a past medical history of Anxiety, Asthma, Back problem, Benign essential hypertension (11/30/2016), Cervical spondylosis without myelopathy (09/17/2012), Complication of  anesthesia, Depression, Diabetes mellitus without complication (HCC) (02/2020), GERD (gastroesophageal reflux disease), Hypertension, Labile hypertension (03/19/2013), Major depressive disorder, recurrent episode, severe (HCC) (06/23/2015), Migraine headache, Migraines (11/11/2013), S/P appendectomy (04/09/2011), Seizure (HCC) (11/30/2016), Seizures (HCC), Shortness of breath, and SOB (shortness of breath) (08/06/2014). Surgical: Thomas Cole  has a past surgical history that includes Knee surgery; Posterior fusion cervical spine; Cervical disc surgery; Appendectomy (12); Anterior cervical corpectomy (N/A, 09/15/2012); Cholecystectomy (N/A, 03/29/2017); Colonoscopy with propofol  (N/A, 10/23/2019); Esophagogastroduodenoscopy (egd) with propofol  (N/A, 10/23/2019); Lumbar laminectomy/decompression microdiscectomy (Right, 04/14/2023); and Decompressive lumbar laminectomy level 2 (Bilateral, 05/28/2023). Family: family history includes Arthritis in his father, maternal grandfather, maternal grandmother, mother, paternal grandfather, and paternal grandmother; Asthma in his mother; Coronary artery disease in his father and mother; Diabetes in his father, paternal grandfather, and paternal grandmother; Heart disease in his father, maternal grandfather, maternal grandmother, paternal grandfather, and paternal grandmother; Hypertension in his maternal grandfather, maternal grandmother, mother, paternal grandfather, and paternal grandmother; Mental illness in his mother.  Laboratory Chemistry Profile   Renal Lab Results  Component Value Date   BUN 16 07/08/2023   CREATININE 1.13 07/08/2023   LABCREA 79.3 03/05/2014   GFR 82.19 08/05/2014   GFRAA >60 04/21/2017   GFRNONAA >60 07/08/2023    Hepatic Lab Results  Component Value Date   AST 30 04/21/2017   ALT 23 04/21/2017   ALBUMIN  4.2 04/21/2017   ALKPHOS 86 04/21/2017   LIPASE 51 04/21/2017    Electrolytes Lab Results  Component Value Date   NA 135 07/08/2023   K  4.1 07/08/2023   CL 99 07/08/2023   CALCIUM 9.5 07/08/2023   MG 2.0 05/31/2023    Bone Lab Results  Component Value Date   VD25OH 31.95 03/05/2016   25OHVITD1 35 04/26/2020   25OHVITD2 <1.0 04/26/2020   25OHVITD3 35 04/26/2020    Inflammation (CRP: Acute Phase) (ESR: Chronic Phase) Lab Results  Component Value Date   CRP 10 04/26/2020   ESRSEDRATE 22 04/26/2020         Note: Above Lab results reviewed.  Recent Imaging Review  MR Lumbar Spine With and Without Intravenous Contrast. 01/26/2024 03:50:57 PM   TECHNIQUE: Multiplanar multisequence MRI of the lumbar spine was performed with and without the administration of intravenous contrast.   CONTRAST: 10 mL of Gadavist .   COMPARISON: MR Lumbar Spine 05/25/2023.   CLINICAL HISTORY: Lumbar claudication, chronic lower back pain, 3 prior surgeries, bilateral leg pain and weakness left>right.   FINDINGS:   BONES AND ALIGNMENT: Normal vertebral body heights. Normal bone marrow signal. No abnormal enhancement. Normal alignment. Status post remote left laminectomy at L5-S1. Status post bilateral laminectomies at L3 and L4.   SPINAL CORD: The conus medullaris terminates at the upper body of L1.   SOFT TISSUES: No acute abnormality.   L1-L2: No disc herniation. No spinal canal stenosis or neural foraminal narrowing.   L2-L3: Broad-based disc bulge. Bilateral facet hypertrophy. Moderate to severe central spinal canal stenosis. Bilateral lateral recess stenosis with apparent impingement of the L3 nerves in the lateral recesses bilaterally.   L3-L4: Status post bilateral laminectomies. Interval improvement of central spinal canal stenosis and lateral recess stenosis secondary to the decompression. Broad-based disc bulging. Mild-to-moderate central spinal canal stenosis. Bilateral lateral recess stenosis without definite nerve root impingement.   L4-L5: Status post bilateral laminectomies. Interval improvement of  central spinal canal stenosis and lateral recess stenosis secondary to the decompression. Broad-based disc bulging. Mild-to-moderate central spinal canal stenosis. Bilateral lateral recess stenosis without  definite nerve root impingement.   L5-S1: Status post remote left laminectomy. Disc space narrowing. Diffuse disc bulging. Bilateral facet hypertrophy. Mild central spinal canal stenosis. Bilateral lateral recess stenosis without apparent nerve root impingement.   IMPRESSION: 1. Status post bilateral L3L4 laminectomies with interval improvement of central canal and lateral recess stenosis at these levels; residual mild-to-moderate central canal and bilateral lateral recess stenosis without definite nerve root impingement. 2. L2L3: Moderate to severe central canal and bilateral lateral recess stenosis with apparent impingement of the L3 nerve roots in the lateral recesses bilaterally. 3. L5S1: Mild central canal and bilateral lateral recess stenosis without apparent nerve root impingement.   Electronically signed by: Evalene Coho MD 01/31/2024 11:08 AM EDT RP Workstation: [id number]    CLINICAL DATA:  Cervical radiculopathy and cervicalgia.   EXAM: MRI CERVICAL SPINE WITHOUT CONTRAST   TECHNIQUE: Multiplanar, multisequence MR imaging of the cervical spine was performed. No intravenous contrast was administered.   COMPARISON:  03/03/2019   FINDINGS: Alignment: No vertebral subluxation is observed.   Vertebrae: Solid interbody fusion at C3-C4-C5-C6-C7. Anterior plate and screw fixator noted. No significant vertebral marrow edema is identified.   Stable hemangioma in the T2 vertebral body.   Cord: Stable narrowing of the cord centered C5 with a 1.0 cm focus of central cord T2 signal hyperintensity at the C5 level compatible with myelomalacia.   Slight narrowing of the cord at C2-3 due to central stenosis.   Posterior Fossa, vertebral arteries, paraspinal  tissues: Mild chronic sphenoid sinusitis.   Disc levels:   C2-3: Progressive thickening of the posterior longitudinal ligament potentially with some mild pannus formation superimposed on disc bulge leading to moderate to prominent central stenosis, mildly worsened previous. AP diameter of the thecal sac 0.6 cm.   C3-4: No impingement.  Fused level.   C4-5: Moderate left central stenosis due to intervertebral spurring. Fused level.   C5-6: No impingement.  Fused level.   C6-7: No impingement.  Fused level.   C7-T1: Moderate right and borderline left foraminal stenosis due to disc bulge and uncinate spurring along with mild degenerative facet arthropathy.   T1-2: Moderate bilateral foraminal stenosis due to disc bulge and facet arthropathy.   IMPRESSION: 1. Moderate to prominent central stenosis at C2-3 due to progressive thickening of the posterior longitudinal ligament and disc bulge. 2. Otherwise spondylosis and degenerative disc disease cause moderate impingement at C4-5, C7-T1, and T1-2. 3. Stable myelomalacia and cord atrophy at the C5 level. 4. Solid interbody fusion at C3-C4-C5-C6-C7.     Electronically Signed   By: Ryan Salvage M.D.   On: 12/18/2023 15:13  Note: Reviewed        Physical Exam  Vitals: BP 118/77   Pulse 85   Temp (!) 97 F (36.1 C)   Resp 16   Ht 6' 3 (1.905 m)   Wt 300 lb (136.1 kg)   SpO2 99%   BMI 37.50 kg/m  BMI: Estimated body mass index is 37.5 kg/m as calculated from the following:   Height as of this encounter: 6' 3 (1.905 m).   Weight as of this encounter: 300 lb (136.1 kg). Ideal: Ideal body weight: 84.5 kg (186 lb 4.6 oz) Adjusted ideal body weight: 105.1 kg (231 lb 12.4 oz) General appearance: Well nourished, well developed, and well hydrated. In no apparent acute distress Mental status: Alert, oriented x 3 (person, place, & time)       Respiratory: No evidence of acute respiratory distress Eyes:  PERLA  Cervical Spine Area Exam  Skin & Axial Inspection: No masses, redness, edema, swelling, or associated skin lesions Alignment: Symmetrical Functional ROM: Pain restricted ROM, bilaterally Stability: No instability detected Muscle Tone/Strength: Functionally intact. No obvious neuro-muscular anomalies detected. Sensory (Neurological): Musculoskeletal pain pattern radicular Palpation: No palpable anomalies             Upper Extremity (UE) Exam    Side: Right upper extremity  Side: Left upper extremity  Skin & Extremity Inspection: Skin color, temperature, and hair growth are WNL. No peripheral edema or cyanosis. No masses, redness, swelling, asymmetry, or associated skin lesions. No contractures.  Skin & Extremity Inspection: Skin color, temperature, and hair growth are WNL. No peripheral edema or cyanosis. No masses, redness, swelling, asymmetry, or associated skin lesions. No contractures.  Functional ROM: Unrestricted ROM          Functional ROM: Unrestricted ROM          Muscle Tone/Strength: Functionally intact. No obvious neuro-muscular anomalies detected.  Muscle Tone/Strength: Functionally intact. No obvious neuro-muscular anomalies detected.  Sensory (Neurological): Neurogenic pain pattern          Sensory (Neurological): Neurogenic pain pattern          Palpation: No palpable anomalies              Palpation: No palpable anomalies              Provocative Test(s):  Phalen's test: deferred Tinel's test: deferred Apley's scratch test (touch opposite shoulder):  Action 1 (Across chest): Decreased ROM Action 2 (Overhead): Decreased ROM Action 3 (LB reach): Decreased ROM   Provocative Test(s):  Phalen's test: deferred Tinel's test: deferred Apley's scratch test (touch opposite shoulder):  Action 1 (Across chest): Decreased ROM Action 2 (Overhead): Decreased ROM Action 3 (LB reach): Decreased ROM    Thoracic Spine Area Exam  Skin & Axial Inspection: No masses, redness, or  swelling Alignment: Symmetrical Functional ROM: Unrestricted ROM Stability: No instability detected Muscle Tone/Strength: Functionally intact. No obvious neuro-muscular anomalies detected. Sensory (Neurological): Unimpaired Muscle strength & Tone: No palpable anomalies Lumbar Spine Area Exam  Skin & Axial Inspection: Well healed scar from previous spine surgery detected Alignment: Symmetrical Functional ROM: Decreased ROM       Stability: No instability detected Muscle Tone/Strength: Functionally intact. No obvious neuro-muscular anomalies detected. Sensory (Neurological): Dermatomal pain pattern Palpation: Uncomfortable        Gait & Posture Assessment  Ambulation: Unassisted Gait: Relatively normal for age and body habitus Posture: WNL  Lower Extremity Exam    Side: Right lower extremity  Side: Left lower extremity  Stability: No instability observed          Stability: No instability observed          Skin & Extremity Inspection: Skin color, temperature, and hair growth are WNL. No peripheral edema or cyanosis. No masses, redness, swelling, asymmetry, or associated skin lesions. No contractures.  Skin & Extremity Inspection: Skin color, temperature, and hair growth are WNL. No peripheral edema or cyanosis. No masses, redness, swelling, asymmetry, or associated skin lesions. No contractures.  Functional ROM: Unrestricted ROM                  Functional ROM: Unrestricted ROM                  Muscle Tone/Strength: Functionally intact. No obvious neuro-muscular anomalies detected.  Muscle Tone/Strength: Functionally intact. No obvious neuro-muscular anomalies detected.  Sensory (Neurological): Neurogenic pain pattern  Sensory (Neurological): Neurogenic pain pattern        DTR: Patellar: deferred today Achilles: deferred today Plantar: deferred today  DTR: Patellar: deferred today Achilles: deferred today Plantar: deferred today  Palpation: No palpable anomalies  Palpation: No  palpable anomalies    Assessment   Diagnosis Status  1. Cervical radicular pain   2. Chronic radicular lumbar pain   3. Lumbar post-laminectomy syndrome   4. Failed back surgical syndrome   5. Chronic pain syndrome    Having a Flare-up Having a Flare-up Having a Flare-up   Updated Problems: No problems updated.  Plan of Care  The patient presents with chronic, intractable pain involving both mechanical low back pain and cervical spine and neurogenic components radiating into the upper and lower extremities. Given the patients history of failed back surgery syndrome (history of cervical spine surgery and lumbar laminectomy) and radiculopathy, and limited response to conservative and interventional therapies, a spinal cord stimulator (SCS) trial is being considered. While SCS is typically more effective for neuropathic and appendicular pain, we discussed that some patients may also experience relief in axial low back and hip pain. The potential benefits and risks of spinal cord stimulation were thoroughly reviewed.  The proposed plan includes a percutaneous spinal cord stimulator trial. The patient was informed that this will involve temporary placement of epidural leads connected to an external pulse generator, which will be used over a 7-day trial period. We discussed the possibility of a mid-trial in-office visit to adjust settings and optimize programming in order to give the patient the best chance of success. The patient will receive daily support from the device representative throughout the trial.  We reviewed the benefits of SCS, which include potential substantial pain relief, reduction in the use of oral pain medications including opioids, and long-term programmable therapy that can reduce reliance on repeated injections and other pain interventions. Risks were also discussed in detail and include potential surgical complications such as infection, bleeding, CSF leak, lead migration  or fracture, hardware malfunction, and the possibility of either no pain relief or worsening of symptoms. The patient was advised that spinal cord stimulation is not a guaranteed solution or a magic bullet, but rather a potentially valuable therapy in appropriately selected cases.  I was also able to evaluate the patient's interlaminar windows under live fluoroscopy and they appear patent for percutaneous access  As part of standard protocol, the patient will require a comprehensive psychosocial and behavioral evaluation prior to the trial. A referral was placed to [facility], and the patient was instructed to have the results faxed to our clinic prior to scheduling the procedure. We had a thorough and detailed discussion reviewing the rationale, alternatives, risks, and expected outcomes. The patient stated that all questions were answered to their satisfaction, demonstrated appropriate understanding, and expressed readiness to proceed. There were no barriers to understanding the treatment plan, and the explanation was well received. The patient is eager to move forward with the spinal cord stimulator trial once the necessary evaluation is complete.  Orders:  Orders Placed This Encounter  Procedures   DG PAIN CLINIC C-ARM 1-60 MIN NO REPORT    Intraoperative interpretation by procedural physician at [facility].    Standing Status:   Standing    Number of Occurrences:   1    Reason for exam::   Assistance in needle guidance and placement for procedures requiring needle placement in or near specific anatomical locations not easily accessible without such assistance.  Ambulatory referral to Psychology    Referral Priority:   Routine    Referral Type:   Psychiatric    Referral Reason:   Specialty Services Required    Requested Specialty:   Psychology    Number of Visits Requested:   1    Return in about 2 weeks (around 04/28/2024) for SCS trial, ECT.    Recent Visits Date Type  Provider Dept  03/02/24 Office Visit Patel, Seema K, NP [facility]  Showing recent visits within past 90 days and meeting all other requirements Today's Visits Date Type Provider Dept  04/14/24 Office Visit Marcelino Nurse, MD [facility]  Showing today's visits and meeting all other requirements Future Appointments Date Type Provider Dept  05/28/24 Appointment Patel, Seema K, NP [facility]  Showing future appointments within next 90 days and meeting all other requirements  I discussed the assessment and treatment plan with the patient. The patient was provided an opportunity to ask questions and all were answered. The patient agreed with the plan and demonstrated an understanding of the instructions.  Patient advised to call back or seek an in-person evaluation if the symptoms or condition worsens.  I personally spent a total of 30 minutes in the care of the patient today including preparing to see the patient, getting/reviewing separately obtained history, performing a medically appropriate exam/evaluation, counseling and educating, placing orders, and documenting clinical information in the EHR.  Note by: Nurse Marcelino, MD (TTS and AI technology used. I apologize for any typographical errors that were not detected and corrected.) Date: 04/14/2024; Time: 4:11 PM

## 2024-04-14 NOTE — Progress Notes (Signed)
 Safety precautions to be maintained throughout the outpatient stay will include: orient to surroundings, keep bed in low position, maintain call bell within reach at all times, provide assistance with transfer out of bed and ambulation.

## 2024-04-15 ENCOUNTER — Other Ambulatory Visit: Payer: Self-pay | Admitting: Nurse Practitioner

## 2024-04-15 DIAGNOSIS — M5412 Radiculopathy, cervical region: Secondary | ICD-10-CM

## 2024-04-15 DIAGNOSIS — M5414 Radiculopathy, thoracic region: Secondary | ICD-10-CM

## 2024-04-15 DIAGNOSIS — G894 Chronic pain syndrome: Secondary | ICD-10-CM

## 2024-04-17 ENCOUNTER — Other Ambulatory Visit: Payer: Self-pay | Admitting: Nurse Practitioner

## 2024-04-17 DIAGNOSIS — M5414 Radiculopathy, thoracic region: Secondary | ICD-10-CM

## 2024-04-17 DIAGNOSIS — G894 Chronic pain syndrome: Secondary | ICD-10-CM

## 2024-04-17 DIAGNOSIS — M5412 Radiculopathy, cervical region: Secondary | ICD-10-CM

## 2024-04-23 ENCOUNTER — Encounter: Payer: Self-pay | Admitting: Neurosurgery

## 2024-05-05 ENCOUNTER — Observation Stay
Admission: EM | Admit: 2024-05-05 | Discharge: 2024-05-06 | Disposition: A | Attending: Emergency Medicine | Admitting: Emergency Medicine

## 2024-05-05 ENCOUNTER — Encounter: Payer: Self-pay | Admitting: Emergency Medicine

## 2024-05-05 ENCOUNTER — Other Ambulatory Visit: Payer: Self-pay

## 2024-05-05 ENCOUNTER — Emergency Department

## 2024-05-05 ENCOUNTER — Observation Stay

## 2024-05-05 DIAGNOSIS — M5416 Radiculopathy, lumbar region: Secondary | ICD-10-CM | POA: Diagnosis not present

## 2024-05-05 DIAGNOSIS — F32A Depression, unspecified: Secondary | ICD-10-CM | POA: Diagnosis present

## 2024-05-05 DIAGNOSIS — G8929 Other chronic pain: Secondary | ICD-10-CM | POA: Diagnosis not present

## 2024-05-05 DIAGNOSIS — E66813 Obesity, class 3: Secondary | ICD-10-CM | POA: Diagnosis present

## 2024-05-05 DIAGNOSIS — J45909 Unspecified asthma, uncomplicated: Secondary | ICD-10-CM | POA: Insufficient documentation

## 2024-05-05 DIAGNOSIS — E119 Type 2 diabetes mellitus without complications: Secondary | ICD-10-CM | POA: Insufficient documentation

## 2024-05-05 DIAGNOSIS — F319 Bipolar disorder, unspecified: Secondary | ICD-10-CM | POA: Insufficient documentation

## 2024-05-05 DIAGNOSIS — G4730 Sleep apnea, unspecified: Secondary | ICD-10-CM | POA: Diagnosis present

## 2024-05-05 DIAGNOSIS — M549 Dorsalgia, unspecified: Secondary | ICD-10-CM | POA: Diagnosis not present

## 2024-05-05 DIAGNOSIS — Z794 Long term (current) use of insulin: Secondary | ICD-10-CM | POA: Insufficient documentation

## 2024-05-05 DIAGNOSIS — F418 Other specified anxiety disorders: Secondary | ICD-10-CM | POA: Insufficient documentation

## 2024-05-05 DIAGNOSIS — J4520 Mild intermittent asthma, uncomplicated: Secondary | ICD-10-CM | POA: Diagnosis present

## 2024-05-05 DIAGNOSIS — K219 Gastro-esophageal reflux disease without esophagitis: Secondary | ICD-10-CM | POA: Diagnosis present

## 2024-05-05 DIAGNOSIS — F419 Anxiety disorder, unspecified: Secondary | ICD-10-CM | POA: Diagnosis present

## 2024-05-05 DIAGNOSIS — Z6837 Body mass index (BMI) 37.0-37.9, adult: Secondary | ICD-10-CM | POA: Insufficient documentation

## 2024-05-05 DIAGNOSIS — G20C Parkinsonism, unspecified: Secondary | ICD-10-CM | POA: Insufficient documentation

## 2024-05-05 DIAGNOSIS — Z79899 Other long term (current) drug therapy: Secondary | ICD-10-CM | POA: Insufficient documentation

## 2024-05-05 DIAGNOSIS — M545 Low back pain, unspecified: Principal | ICD-10-CM | POA: Insufficient documentation

## 2024-05-05 DIAGNOSIS — I10 Essential (primary) hypertension: Secondary | ICD-10-CM | POA: Diagnosis present

## 2024-05-05 LAB — CBC WITH DIFFERENTIAL/PLATELET
Abs Immature Granulocytes: 0.03 10*3/uL (ref 0.00–0.07)
Basophils Absolute: 0 10*3/uL (ref 0.0–0.1)
Basophils Relative: 1 %
Eosinophils Absolute: 0.2 10*3/uL (ref 0.0–0.5)
Eosinophils Relative: 2 %
HCT: 41.3 % (ref 39.0–52.0)
Hemoglobin: 14.1 g/dL (ref 13.0–17.0)
Immature Granulocytes: 0 %
Lymphocytes Relative: 39 %
Lymphs Abs: 3.4 10*3/uL (ref 0.7–4.0)
MCH: 29.6 pg (ref 26.0–34.0)
MCHC: 34.1 g/dL (ref 30.0–36.0)
MCV: 86.6 fL (ref 80.0–100.0)
Monocytes Absolute: 0.8 10*3/uL (ref 0.1–1.0)
Monocytes Relative: 9 %
Neutro Abs: 4.3 10*3/uL (ref 1.7–7.7)
Neutrophils Relative %: 49 %
Platelets: 176 10*3/uL (ref 150–400)
RBC: 4.77 MIL/uL (ref 4.22–5.81)
RDW: 13.6 % (ref 11.5–15.5)
WBC: 8.7 10*3/uL (ref 4.0–10.5)
nRBC: 0 % (ref 0.0–0.2)

## 2024-05-05 LAB — BASIC METABOLIC PANEL WITH GFR
Anion gap: 12 (ref 5–15)
BUN: 16 mg/dL (ref 6–20)
CO2: 26 mmol/L (ref 22–32)
Calcium: 9.3 mg/dL (ref 8.9–10.3)
Chloride: 98 mmol/L (ref 98–111)
Creatinine, Ser: 1.02 mg/dL (ref 0.61–1.24)
GFR, Estimated: >60 mL/min
Glucose, Bld: 174 mg/dL — ABNORMAL HIGH (ref 70–99)
Potassium: 4 mmol/L (ref 3.5–5.1)
Sodium: 137 mmol/L (ref 135–145)

## 2024-05-05 LAB — CBC
HCT: 41.5 % (ref 39.0–52.0)
Hemoglobin: 13.8 g/dL (ref 13.0–17.0)
MCH: 28.9 pg (ref 26.0–34.0)
MCHC: 33.3 g/dL (ref 30.0–36.0)
MCV: 87 fL (ref 80.0–100.0)
Platelets: 160 10*3/uL (ref 150–400)
RBC: 4.77 MIL/uL (ref 4.22–5.81)
RDW: 13.5 % (ref 11.5–15.5)
WBC: 8.5 10*3/uL (ref 4.0–10.5)
nRBC: 0 % (ref 0.0–0.2)

## 2024-05-05 LAB — HEMOGLOBIN A1C
Hgb A1c MFr Bld: 7.7 % — ABNORMAL HIGH (ref 4.8–5.6)
Mean Plasma Glucose: 174.29 mg/dL

## 2024-05-05 LAB — CBG MONITORING, ED
Glucose-Capillary: 266 mg/dL — ABNORMAL HIGH (ref 70–99)
Glucose-Capillary: 189 mg/dL — ABNORMAL HIGH (ref 70–99)
Glucose-Capillary: 146 mg/dL — ABNORMAL HIGH (ref 70–99)

## 2024-05-05 LAB — CREATININE, SERUM
Creatinine, Ser: 1.11 mg/dL (ref 0.61–1.24)
GFR, Estimated: >60 mL/min

## 2024-05-05 MED ORDER — ONDANSETRON HCL 4 MG/2ML IJ SOLN
4.0000 mg | Freq: Once | INTRAMUSCULAR | Status: AC
  Administered 2024-05-05: 4 mg via INTRAVENOUS
  Filled 2024-05-05: qty 2

## 2024-05-05 MED ORDER — FUROSEMIDE 40 MG PO TABS
20.0000 mg | ORAL_TABLET | Freq: Every day | ORAL | Status: DC
  Administered 2024-05-06: 20 mg via ORAL
  Filled 2024-05-05: qty 1

## 2024-05-05 MED ORDER — HYDROMORPHONE HCL 1 MG/ML IJ SOLN
0.5000 mg | Freq: Once | INTRAMUSCULAR | Status: AC
  Administered 2024-05-05: 0.5 mg via INTRAVENOUS
  Filled 2024-05-05: qty 0.5

## 2024-05-05 MED ORDER — ALBUTEROL SULFATE (2.5 MG/3ML) 0.083% IN NEBU: 2.5000 mg | INHALATION_SOLUTION | RESPIRATORY_TRACT | Status: AC | PRN

## 2024-05-05 MED ORDER — CYANOCOBALAMIN 500 MCG PO TABS
1000.0000 ug | ORAL_TABLET | Freq: Every day | ORAL | Status: DC
  Administered 2024-05-06: 1000 ug via ORAL
  Filled 2024-05-05: qty 2

## 2024-05-05 MED ORDER — CLONAZEPAM 0.5 MG PO TABS: 1.0000 mg | ORAL_TABLET | Freq: Every evening | ORAL | Status: DC | PRN

## 2024-05-05 MED ORDER — HYDROMORPHONE HCL 1 MG/ML IJ SOLN
1.0000 mg | Freq: Once | INTRAMUSCULAR | Status: AC
  Administered 2024-05-05: 1 mg via INTRAVENOUS
  Filled 2024-05-05: qty 1

## 2024-05-05 MED ORDER — KETOROLAC TROMETHAMINE 30 MG/ML IJ SOLN
30.0000 mg | Freq: Once | INTRAMUSCULAR | Status: AC
  Administered 2024-05-05: 30 mg via INTRAVENOUS
  Filled 2024-05-05: qty 1

## 2024-05-05 MED ORDER — METOPROLOL SUCCINATE ER 25 MG PO TB24
12.5000 mg | ORAL_TABLET | Freq: Every day | ORAL | Status: DC
  Administered 2024-05-06: 12.5 mg via ORAL
  Filled 2024-05-05: qty 1

## 2024-05-05 MED ORDER — HYDRALAZINE HCL 20 MG/ML IJ SOLN: 10.0000 mg | Freq: Four times a day (QID) | INTRAMUSCULAR | Status: DC | PRN

## 2024-05-05 MED ORDER — PREGABALIN 50 MG PO CAPS
100.0000 mg | ORAL_CAPSULE | Freq: Three times a day (TID) | ORAL | Status: DC
  Administered 2024-05-05 – 2024-05-06 (×3): 100 mg via ORAL
  Filled 2024-05-05 (×3): qty 2

## 2024-05-05 MED ORDER — PANTOPRAZOLE SODIUM 40 MG PO TBEC
40.0000 mg | DELAYED_RELEASE_TABLET | Freq: Two times a day (BID) | ORAL | Status: DC
  Administered 2024-05-05 – 2024-05-06 (×2): 40 mg via ORAL
  Filled 2024-05-05 (×2): qty 1

## 2024-05-05 MED ORDER — CARBIDOPA-LEVODOPA ER 50-200 MG PO TBCR
1.0000 | EXTENDED_RELEASE_TABLET | Freq: Every day | ORAL | Status: DC
  Administered 2024-05-05: 1 via ORAL
  Filled 2024-05-05: qty 1

## 2024-05-05 MED ORDER — INSULIN ASPART 100 UNIT/ML IJ SOLN
0.0000 [IU] | Freq: Three times a day (TID) | INTRAMUSCULAR | Status: DC
  Administered 2024-05-05: 2 [IU] via SUBCUTANEOUS
  Administered 2024-05-05: 5 [IU] via SUBCUTANEOUS
  Administered 2024-05-06: 1 [IU] via SUBCUTANEOUS
  Filled 2024-05-05: qty 2
  Filled 2024-05-05: qty 5
  Filled 2024-05-05: qty 1

## 2024-05-05 MED ORDER — LORAZEPAM 2 MG/ML IJ SOLN: 1.0000 mg | Freq: Once | INTRAMUSCULAR | Status: DC | PRN

## 2024-05-05 MED ORDER — ENOXAPARIN SODIUM 80 MG/0.8ML IJ SOSY
70.0000 mg | PREFILLED_SYRINGE | INTRAMUSCULAR | Status: DC
  Administered 2024-05-05: 70 mg via SUBCUTANEOUS
  Filled 2024-05-05: qty 0.7

## 2024-05-05 MED ORDER — PRAZOSIN HCL 2 MG PO CAPS: 2.0000 mg | ORAL_CAPSULE | Freq: Every day | ORAL | Status: DC

## 2024-05-05 MED ORDER — ACETAMINOPHEN 650 MG RE SUPP: 650.0000 mg | Freq: Four times a day (QID) | RECTAL | Status: DC | PRN

## 2024-05-05 MED ORDER — INSULIN ASPART 100 UNIT/ML IJ SOLN: 0.0000 [IU] | Freq: Every day | INTRAMUSCULAR | Status: DC

## 2024-05-05 MED ORDER — LOSARTAN POTASSIUM 50 MG PO TABS
100.0000 mg | ORAL_TABLET | Freq: Every day | ORAL | Status: DC
  Administered 2024-05-06: 100 mg via ORAL
  Filled 2024-05-05: qty 2

## 2024-05-05 MED ORDER — POLYETHYLENE GLYCOL 3350 17 G PO PACK: 17.0000 g | PACK | Freq: Every day | ORAL | Status: DC | PRN

## 2024-05-05 MED ORDER — ACETAMINOPHEN 325 MG PO TABS: 650.0000 mg | ORAL_TABLET | Freq: Four times a day (QID) | ORAL | Status: DC | PRN

## 2024-05-05 MED ORDER — ONDANSETRON HCL 4 MG/2ML IJ SOLN: 4.0000 mg | Freq: Three times a day (TID) | INTRAMUSCULAR | Status: AC | PRN

## 2024-05-05 MED ORDER — OXYCODONE HCL 5 MG PO TABS
5.0000 mg | ORAL_TABLET | ORAL | Status: DC | PRN
  Administered 2024-05-05 – 2024-05-06 (×4): 5 mg via ORAL
  Filled 2024-05-05 (×4): qty 1

## 2024-05-05 MED ORDER — TIZANIDINE HCL 2 MG PO TABS
4.0000 mg | ORAL_TABLET | Freq: Three times a day (TID) | ORAL | Status: DC | PRN
  Administered 2024-05-06: 4 mg via ORAL
  Filled 2024-05-05: qty 2

## 2024-05-05 MED ORDER — HYDROMORPHONE HCL 1 MG/ML IJ SOLN: 0.5000 mg | INTRAMUSCULAR | Status: AC | PRN

## 2024-05-05 MED ORDER — DEXAMETHASONE SOD PHOSPHATE PF 10 MG/ML IJ SOLN
10.0000 mg | Freq: Once | INTRAMUSCULAR | Status: AC
  Administered 2024-05-05: 10 mg via INTRAVENOUS
  Filled 2024-05-05: qty 1

## 2024-05-05 MED ORDER — DULOXETINE HCL 60 MG PO CPEP
60.0000 mg | ORAL_CAPSULE | Freq: Every day | ORAL | Status: DC
  Administered 2024-05-06: 60 mg via ORAL
  Filled 2024-05-05: qty 1

## 2024-05-05 NOTE — ED Notes (Addendum)
 Pt reports worsening lower back pain tonight. PT does have known hx of same but states tonight he could not get comfortable or control the pain.  Pt states the pain radiates down to his legs making it hard for him to walk. PMS intact bilaterally.

## 2024-05-05 NOTE — ED Triage Notes (Signed)
 Pt arrived via [facility] from home with c/o generalized back pain x1 day. Pt has chronic back pain with previous fusion. Pt reports he was walking his dog in the snow and that is when pain started. Pt denies urinary symptoms.

## 2024-05-05 NOTE — Progress Notes (Signed)
 PT Cancellation Note  Patient Details Name: Thomas Cole MRN: [id number] DOB: [birth year]   Cancelled Treatment:    Reason Eval/Treat Not Completed: PT screened, no needs identified, will sign off (Pt received AMB in hallway with RN, back from BR to ED gurney. Pt reports able to safely manage his basic mobility for DC back to home. Pt not using any DME, but has all available.) No need for PT services or equipment at DC. Pt also feels OT evaluation unnecessary at this time. PT signing off.   2:37 PM, 05/05/24 [name], PT, DPT Physical Therapist - [facility]  [phone] (ASCOM)      [place] [facility] 05/05/2024, 2:37 PM

## 2024-05-05 NOTE — ED Notes (Signed)
 Pt ambulatory to restroom and back to bed without incident. PT at the bedside.

## 2024-05-06 ENCOUNTER — Encounter: Payer: Self-pay | Admitting: *Deleted

## 2024-05-06 ENCOUNTER — Other Ambulatory Visit: Payer: Self-pay

## 2024-05-06 DIAGNOSIS — M25551 Pain in right hip: Secondary | ICD-10-CM | POA: Insufficient documentation

## 2024-05-06 DIAGNOSIS — I10 Essential (primary) hypertension: Secondary | ICD-10-CM | POA: Insufficient documentation

## 2024-05-06 DIAGNOSIS — J45909 Unspecified asthma, uncomplicated: Secondary | ICD-10-CM | POA: Insufficient documentation

## 2024-05-06 DIAGNOSIS — E119 Type 2 diabetes mellitus without complications: Secondary | ICD-10-CM | POA: Insufficient documentation

## 2024-05-06 LAB — HIV ANTIBODY (ROUTINE TESTING W REFLEX): HIV Screen 4th Generation wRfx: NONREACTIVE

## 2024-05-06 LAB — BASIC METABOLIC PANEL WITH GFR
Anion gap: 13 (ref 5–15)
BUN: 23 mg/dL — ABNORMAL HIGH (ref 6–20)
CO2: 26 mmol/L (ref 22–32)
Calcium: 9.2 mg/dL (ref 8.9–10.3)
Chloride: 98 mmol/L (ref 98–111)
Creatinine, Ser: 1 mg/dL (ref 0.61–1.24)
GFR, Estimated: >60 mL/min
Glucose, Bld: 129 mg/dL — ABNORMAL HIGH (ref 70–99)
Potassium: 4 mmol/L (ref 3.5–5.1)
Sodium: 137 mmol/L (ref 135–145)

## 2024-05-06 LAB — CBC
HCT: 39.8 % (ref 39.0–52.0)
Hemoglobin: 13.6 g/dL (ref 13.0–17.0)
MCH: 29.3 pg (ref 26.0–34.0)
MCHC: 34.2 g/dL (ref 30.0–36.0)
MCV: 85.8 fL (ref 80.0–100.0)
Platelets: 212 10*3/uL (ref 150–400)
RBC: 4.64 MIL/uL (ref 4.22–5.81)
RDW: 13.6 % (ref 11.5–15.5)
WBC: 12.9 10*3/uL — ABNORMAL HIGH (ref 4.0–10.5)
nRBC: 0 % (ref 0.0–0.2)

## 2024-05-06 LAB — CBG MONITORING, ED: Glucose-Capillary: 137 mg/dL — ABNORMAL HIGH (ref 70–99)

## 2024-05-06 MED ORDER — DEXAMETHASONE 4 MG PO TABS: 4.0000 mg | ORAL_TABLET | Freq: Every day | ORAL | 0 refills | Status: AC

## 2024-05-06 MED ORDER — HYDROMORPHONE HCL 2 MG PO TABS: 2.0000 mg | ORAL_TABLET | Freq: Four times a day (QID) | ORAL | 0 refills | Status: AC | PRN

## 2024-05-06 MED ORDER — HYDROMORPHONE HCL 1 MG/ML IJ SOLN
1.0000 mg | Freq: Once | INTRAMUSCULAR | Status: AC
  Administered 2024-05-06: 1 mg via INTRAVENOUS
  Filled 2024-05-06: qty 1

## 2024-05-06 MED ORDER — METHOCARBAMOL 500 MG PO TABS: 500.0000 mg | ORAL_TABLET | Freq: Three times a day (TID) | ORAL | 0 refills | Status: AC | PRN

## 2024-05-06 NOTE — Hospital Course (Addendum)
 Partly taken from prior notes.  Thomas Cole is a pleasant 54 y.o. male with medical history significant for HTN, DM, seizure disorder, chronic back pain s/p laminectomy L4-L5 microdiscectomy on 04/14/2023 and lumbar laminectomy, medial facetectomy, lateral recess decompression from L2-L5 on 05/28/2023 by Dr. Claudene, who came in to ED complaining of severe low back pain for the last couple weeks.  Denies any bladder or bowel incontinence.  No focal weakness or paresthesias.  On presentation hemodynamically stable, appears to be in severe pain.  MRI lumbar with significant multilevel stenosis without any definitive nerve root impingement.  Neurosurgery was consulted and patient was admitted for pain management.  2/4: Vital and labs stable.  Mild leukocytosis but patient received 10 mg of Decadron  yesterday.  Neurosurgery is recommending outpatient follow-up as he is being evaluated by them for spinal stimulator.  X-ray hip with moderate bilateral degenerative disease. PT and OT with no follow-up recommendations.  Per patient Dilaudid  helped more than his home Norco.  We discontinued her home Norco prescription which was his chronic medication and he was given some Dilaudid .  If Dilaudid  continue to help more he need to follow-up with his neurosurgeon to get new prescriptions.  We also discontinued home tizanidine  and started him on Robaxin  to help with spasms.  Patient was also given few days of Decadron  to help decrease the inflammation.  Patient need to have a close follow-up with his pain management provider and neurosurgeon as they were evaluating him for spinal stimulators placement.  He will continue with his home medications and follow-up with his providers for further assistance.

## 2024-05-06 NOTE — Discharge Summary (Signed)
 " Physician Discharge Summary   Patient: Thomas Cole MRN: [id number] DOB: June 12, 1970  Admit date:     05/05/2024  Discharge date: 05/06/24  Discharge Physician: Amaryllis Dare   PCP: Diedra Lame, MD   Recommendations at discharge:  Please obtain CBC and BMP and follow-up His home Norco prescriptions were discontinued as patient thinks that Dilaudid  helps better.  He is being given some p.o. Dilaudid  and can be switched by his outpatient pain specialist if needed. Home tizanidine  has been switched with Robaxin  to see if that will help. Follow-up with neurosurgery Follow-up with primary care provider  Discharge Diagnoses: Principal Problem:   Intractable back pain Active Problems:   Acute on chronic low back pain   Essential hypertension   Asthma, mild intermittent   Diabetes mellitus without complication (HCC)   Anxiety and depression   Sleep apnea   GERD without esophagitis   Obesity, Class III, BMI 40-49.9 (morbid obesity) [facility])   Hospital Course: Partly taken from prior notes.  Thomas Cole is a pleasant 54 y.o. male with medical history significant for HTN, DM, seizure disorder, chronic back pain s/p laminectomy L4-L5 microdiscectomy on 04/14/2023 and lumbar laminectomy, medial facetectomy, lateral recess decompression from L2-L5 on 05/28/2023 by Dr. Claudene, who came in to ED complaining of severe low back pain for the last couple weeks.  Denies any bladder or bowel incontinence.  No focal weakness or paresthesias.  On presentation hemodynamically stable, appears to be in severe pain.  MRI lumbar with significant multilevel stenosis without any definitive nerve root impingement.  Neurosurgery was consulted and patient was admitted for pain management.  2/4: Vital and labs stable.  Mild leukocytosis but patient received 10 mg of Decadron  yesterday.  Neurosurgery is recommending outpatient follow-up as he is being evaluated by them for spinal stimulator.  X-ray hip with moderate  bilateral degenerative disease. PT and OT with no follow-up recommendations.  Per patient Dilaudid  helped more than his home Norco.  We discontinued her home Norco prescription which was his chronic medication and he was given some Dilaudid .  If Dilaudid  continue to help more he need to follow-up with his neurosurgeon to get new prescriptions.  We also discontinued home tizanidine  and started him on Robaxin  to help with spasms.  Patient was also given few days of Decadron  to help decrease the inflammation.  Patient need to have a close follow-up with his pain management provider and neurosurgeon as they were evaluating him for spinal stimulators placement.  He will continue with his home medications and follow-up with his providers for further assistance.   Pain control - [place]  Controlled Substance Reporting System database was reviewed. and patient was instructed, not to drive, operate heavy machinery, perform activities at heights, swimming or participation in water activities or provide baby-sitting services while on Pain, Sleep and Anxiety Medications; until their outpatient Physician has advised to do so again. Also recommended to not to take more than prescribed Pain, Sleep and Anxiety Medications.  Consultants: Neurosurgery Procedures performed: None Disposition: Home Diet recommendation:  Regular diet DISCHARGE MEDICATION: Allergies as of 05/06/2024       Reactions   Amitriptyline Other (See Comments)   Felt really bad- psychologically   Bee Venom Itching, Swelling   Chlorhexidine Itching   REACTION TO WIPES/ CLOTHES ONLY==he can tolerate the SCRUB LIQUID   Carbamazepine Itching, Rash   Hm Lidocaine  Patch [lidocaine ] Hives, Rash   Meloxicam Itching, Rash   Morphine  And Codeine Anxiety   Makes him  restless  Makes him restless    Sulfa Antibiotics Itching, Rash, Other (See Comments)        Medication List     STOP taking these medications     HYDROcodone -acetaminophen  10-325 MG tablet Commonly known as: NORCO   prazosin  2 MG capsule Commonly known as: MINIPRESS    tiZANidine  4 MG tablet Commonly known as: ZANAFLEX        TAKE these medications    carbidopa -levodopa  50-200 MG tablet Commonly known as: SINEMET  CR Take 1 tablet by mouth at bedtime. The timing of this medication is very important.   carbidopa -levodopa  25-100 MG tablet Commonly known as: SINEMET  IR 1.5 tablets. The timing of this medication is very important.   clonazePAM  1 MG tablet Commonly known as: KLONOPIN  Take 1 mg by mouth at bedtime as needed.   cyanocobalamin  1000 MCG tablet Commonly known as: VITAMIN B12 Take 1,000 mcg by mouth daily.   dexamethasone  4 MG tablet Commonly known as: DECADRON  Take 1 tablet (4 mg total) by mouth daily for 5 days.   DULoxetine  60 MG capsule Commonly known as: Cymbalta  Take 1 capsule (60 mg total) by mouth daily after breakfast.   furosemide  20 MG tablet Commonly known as: LASIX  Take 20 mg by mouth daily.   hydrochlorothiazide  25 MG tablet Commonly known as: HYDRODIURIL  Take 25 mg by mouth daily.   HYDROmorphone  2 MG tablet Commonly known as: Dilaudid  Take 1 tablet (2 mg total) by mouth every 6 (six) hours as needed for up to 5 days for severe pain (pain score 7-10).   lamoTRIgine  25 MG tablet Commonly known as: LAMICTAL  Take 75 mg by mouth 2 (two) times daily. Titration schedule   losartan  100 MG tablet Commonly known as: COZAAR  Take 100 mg by mouth daily.   metFORMIN 500 MG tablet Commonly known as: GLUCOPHAGE Take 1,000 mg by mouth daily with breakfast.   methocarbamol  500 MG tablet Commonly known as: ROBAXIN  Take 1 tablet (500 mg total) by mouth every 8 (eight) hours as needed for muscle spasms.   metoprolol  succinate 25 MG 24 hr tablet Commonly known as: TOPROL -XL Take 12.5 mg by mouth daily.   naloxone  4 MG/0.1ML Liqd nasal spray kit Commonly known as: NARCAN  Place 1 spray  into the nose as needed for up to 365 doses (for opioid-induced respiratory depresssion). In case of emergency (overdose), spray once into each nostril. If no response within 3 minutes, repeat application and call 911.   nortriptyline  25 MG capsule Commonly known as: PAMELOR  Take 2 capsules (50 mg total) by mouth at bedtime.   omeprazole  40 MG capsule Commonly known as: PRILOSEC Take 1 capsule by mouth 2 (two) times daily.   pregabalin  100 MG capsule Commonly known as: Lyrica  Take 1 capsule (100 mg total) by mouth 3 (three) times daily.   sildenafil 100 MG tablet Commonly known as: VIAGRA 1/2 to 1 tab daily as needed prior to sex   Vitamin D3 25 MCG (1000 UT) Caps Take 1 capsule by mouth daily.        Follow-up Information     Diedra Lame, MD. Schedule an appointment as soon as possible for a visit in 1 week(s).   Specialty: Family Medicine Contact information: [address] [facility] [place] [address] [phone]                Discharge Exam: Fredricka Weights   05/05/24 0422  Weight: 136.1 kg   General.  Obese  gentleman, in no acute distress. Pulmonary.  Lungs clear bilaterally, normal respiratory effort. CV.  Regular rate and rhythm, no JVD, rub or murmur. Abdomen.  Soft, nontender, nondistended, BS positive. CNS.  Alert and oriented .  No focal neurologic deficit. Extremities.  No edema, no cyanosis, pulses intact and symmetrical. Psychiatry.  Judgment and insight appears normal.   Condition at discharge: stable  The results of significant diagnostics from this hospitalization (including imaging, microbiology, ancillary and laboratory) are listed below for reference.   Imaging Studies: DG HIPS BILAT WITH PELVIS MIN 5 VIEWS Result Date: 05/05/2024 CLINICAL DATA:  Chronic bilateral hip pain and lower back pain, right greater than left. EXAM: DG HIP (WITH OR WITHOUT PELVIS) 5+V BILAT COMPARISON:  October 21, 2020 FINDINGS: There is no evidence of hip fracture or dislocation. Moderate severity degenerative changes are seen in the form of joint space narrowing and acetabular sclerosis. IMPRESSION: Moderate severity degenerative changes in the bilateral hips. Electronically Signed   By: Suzen Dials M.D.   On: 05/05/2024 11:21   MR LUMBAR SPINE WO CONTRAST Result Date: 05/05/2024 EXAM: MRI LUMBAR SPINE 05/05/2024 05:50:49 AM TECHNIQUE: Multiplanar multisequence MRI of the lumbar spine was performed without the administration of intravenous contrast. COMPARISON: MRI lumbar spine dated 01/26/2024. CLINICAL HISTORY: Increasing low back pain with pain, numbness and weakness radiating into the right leg. History of microdiscectomy x 2 in January and February 2025. FINDINGS: BONES AND ALIGNMENT: Normal alignment. Normal vertebral body heights. Bone marrow signal is unremarkable. The patient is status post bilateral laminectomies at L3-L4 and L4-L5 and left laminectomy at L5-S1. SPINAL CORD: The conus medullaris terminates at the mid body of L1. SOFT TISSUES: No paraspinal mass. L1-L2: The spinal canal and neural foramina are widely patent. L2-L3: There is broad-based disc bulging and mild facet hypertrophy, resulting in moderate-to-severe central spinal canal stenosis and bilateral lateral recess stenosis with questionable impingement of the L3 nerves in the lateral recesses bilaterally. L3-L4: There is diffuse disc bulging and mild facet hypertrophic changes. There is moderate bilateral lateral recess and neural foraminal stenosis, but no definite nerve root impingement. L4-L5: There is diffuse disc bulging and bilateral facet arthrosis with moderate bilateral lateral recess and bilateral neural foraminal stenosis. There is questionable impingement of the L5 nerves in the lateral recesses. L5-S1: There is bilateral facet hypertrophy. The central spinal canal is patent. There is mild-to-moderate bilateral neural foraminal  stenosis, but no apparent nerve root impingement. IMPRESSION: 1. Status post bilateral laminectomies at L3-4 and L4-5 and left laminectomy at L5-S1. 2. Moderate-to-severe central spinal canal stenosis and bilateral lateral recess stenosis at L2-3 with questionable impingement of the L3 nerves in the lateral recesses bilaterally. 3. Moderate bilateral lateral recess and neural foraminal stenosis at L3-4 without definite nerve root impingement. 4. Moderate bilateral lateral recess and neural foraminal stenosis at L4-5 with questionable impingement of the L5 nerves in the lateral recesses. 5. Mild-to-moderate bilateral neural foraminal stenosis at L5-S1 without apparent nerve root impingement. Electronically signed by: Evalene Coho MD 05/05/2024 06:05 AM EST RP Workstation: [id number]   DG PAIN CLINIC C-ARM 1-60 MIN NO REPORT Result Date: 04/14/2024 Fluoro was used, but no Radiologist interpretation will be provided. Please refer to NOTES tab for provider progress note.   Microbiology: Results for orders placed or performed during the hospital encounter of 10/21/19  SARS CORONAVIRUS 2 (TAT 6-24 HRS) Nasopharyngeal Nasopharyngeal Swab     Status: None   Collection Time: 10/21/19 11:30 AM   Specimen: Nasopharyngeal Swab  Result Value Ref Range Status   SARS Coronavirus 2 NEGATIVE NEGATIVE Final    Comment: (NOTE) SARS-CoV-2 target nucleic acids are NOT DETECTED.  The SARS-CoV-2 RNA is generally detectable in upper and lower respiratory specimens during the acute phase of infection. Negative results do not preclude SARS-CoV-2 infection, do not rule out co-infections with other pathogens, and should not be used as the sole basis for treatment or other patient management decisions. Negative results must be combined with clinical observations, patient history, and epidemiological information. The expected result is Negative.  Fact Sheet for  Patients: hairslick.no  Fact Sheet for Healthcare Providers: quierodirigir.com  This test is not yet approved or cleared by the [place]  FDA and  has been authorized for detection and/or diagnosis of SARS-CoV-2 by FDA under an Emergency Use Authorization (EUA). This EUA will remain  in effect (meaning this test can be used) for the duration of the COVID-19 declaration under Se ction 564(b)(1) of the Act, 21 U.S.C. section 360bbb-3(b)(1), unless the authorization is terminated or revoked sooner.  Performed at [facility], [address], [place], [place] [address]     Labs: CBC: Recent Labs  Lab 05/05/24 0442 05/05/24 0945 05/06/24 0345  WBC 8.7 8.5 12.9*  NEUTROABS 4.3  --   --   HGB 14.1 13.8 13.6  HCT 41.3 41.5 39.8  MCV 86.6 87.0 85.8  PLT 176 160 212   Basic Metabolic Panel: Recent Labs  Lab 05/05/24 0442 05/05/24 0945 05/06/24 0345  NA 137  --  137  K 4.0  --  4.0  CL 98  --  98  CO2 26  --  26  GLUCOSE 174*  --  129*  BUN 16  --  23*  CREATININE 1.02 1.11 1.00  CALCIUM 9.3  --  9.2   Liver Function Tests: No results for input(s): AST, ALT, ALKPHOS, BILITOT, PROT, ALBUMIN  in the last 168 hours. CBG: Recent Labs  Lab 05/05/24 1216 05/05/24 1623 05/05/24 2127 05/06/24 0737  GLUCAP 266* 189* 146* 137*    Discharge time spent: greater than 30 minutes.  This record has been created using Conservation officer, historic buildings. Errors have been sought and corrected,but may not always be located. Such creation errors do not reflect on the standard of care.   Signed: Amaryllis Dare, MD [facility] 05/06/2024 "

## 2024-05-06 NOTE — ED Triage Notes (Signed)
 Pt brought in via ems from home.  Pt has lower back pain.  Pt has chronic pain and was just released from here today.  No known injury.   Denies urinary sx  Pt alert  speech clear.

## 2024-05-07 ENCOUNTER — Emergency Department

## 2024-05-07 ENCOUNTER — Encounter: Payer: Self-pay | Admitting: Neurosurgery

## 2024-05-07 ENCOUNTER — Emergency Department
Admission: EM | Admit: 2024-05-07 | Discharge: 2024-05-07 | Disposition: A | Attending: Emergency Medicine | Admitting: Emergency Medicine

## 2024-05-07 ENCOUNTER — Telehealth: Payer: Self-pay

## 2024-05-07 DIAGNOSIS — M25551 Pain in right hip: Secondary | ICD-10-CM

## 2024-05-07 MED ORDER — HYDROMORPHONE HCL 1 MG/ML IJ SOLN
1.0000 mg | Freq: Once | INTRAMUSCULAR | Status: AC
  Administered 2024-05-07: 1 mg via INTRAVENOUS
  Filled 2024-05-07: qty 1

## 2024-05-07 MED ORDER — LORAZEPAM 2 MG/ML IJ SOLN
1.0000 mg | Freq: Once | INTRAMUSCULAR | Status: AC | PRN
  Administered 2024-05-07: 1 mg via INTRAVENOUS
  Filled 2024-05-07: qty 1

## 2024-05-07 MED ORDER — KETOROLAC TROMETHAMINE 30 MG/ML IJ SOLN
30.0000 mg | Freq: Once | INTRAMUSCULAR | Status: AC
  Administered 2024-05-07: 30 mg via INTRAVENOUS
  Filled 2024-05-07: qty 1

## 2024-05-07 MED ORDER — ONDANSETRON HCL 4 MG/2ML IJ SOLN
4.0000 mg | Freq: Once | INTRAMUSCULAR | Status: AC
  Administered 2024-05-07: 4 mg via INTRAVENOUS
  Filled 2024-05-07: qty 2

## 2024-05-07 NOTE — Discharge Instructions (Signed)
 Preliminary read of your right hip MRI showed no significant or emergent pathology but an official read will be done later today.  I have given you orthopedic follow-up.  Please continue to take your pain medication and steroids as prescribed and follow-up with Dr. Claudene with neurosurgery as previously scheduled.  If you do not feel like your pain is being appropriately controlled I recommend that you follow-up with your PCP and pain management specialist.

## 2024-05-07 NOTE — Telephone Encounter (Signed)
 Per ED notes, Dr. Claudene was consulted on this patient. Pain medication regimen changed. Plan to follow up outpatient as scheduled.     Media Information   Document Information  Patient Summary: AMB Patient Logs/Info  NEUROSURGERY AFTER HOURS CALL  05/06/2024 08:15  Attached To:  Thomas Cole  Source Information  Default, Provider, MD

## 2024-05-07 NOTE — ED Provider Notes (Signed)
 "  [facility] Provider Note    Event Date/Time   First MD Initiated Contact with Patient 05/07/24 0011     (approximate)   History   Back Pain   HPI  Thomas Cole is a 54 y.o. male  with history of hypertension, diabetes, seizures, chronic back pain followed by pain management who presents to the emergency department with increasing lower back pain over the past several days with pain, numbness and weakness in the right leg worse with standing.  Denies any known injury.  States that this feels similar to when he had to have a microdiscectomy in January and February 2025.  No bowel or bladder incontinence.  No fever.   Patient underwent right L4-L5 microdiscectomy on 04/14/2023 and lumbar laminectomy, medial facetectomy, lateral recess decompression from L2-L5 on 05/28/2023 by Dr. Claudene.   Patient was just admitted to the hospital for the same and discharged yesterday.  Was seen by neurosurgery who again recommended outpatient follow-up for her spinal stimulator and then subsequent laminectomies.  There was some concern that patient's pain was coming from his hip.  He denies any injury to the hip.  X-ray of the hip showed degenerative changes but no other acute abnormality.  Patient's pain is worse with walking and standing but he is able to ambulate.   History provided by patient.    Past Medical History:  Diagnosis Date   Anxiety    Asthma    Back problem    Benign essential hypertension 11/30/2016   Cervical spondylosis without myelopathy 09/17/2012   Complication of anesthesia    occ takes longer to wake   Depression    Diabetes mellitus without complication (HCC) 02/2020   Type 2   GERD (gastroesophageal reflux disease)    Hypertension    Labile hypertension 03/19/2013   No current neuro changes, headache much improved. No clear sign of intracranial bleed. NO indication for head CT>  BP much better now... Fluctuates a lot.  Will eval for  pheochromocytoma. As recommended at last cardiology OV.. Will prescribe hydralazine  to use as needed for BP fluctuations.  Close follow up with PCP.    Major depressive disorder, recurrent episode, severe (HCC) 06/23/2015   Migraine headache    Migraines 11/11/2013   S/P appendectomy 04/09/2011   Seizure (HCC) 11/30/2016   Seizures (HCC)    last 87   Shortness of breath    SOB (shortness of breath) 08/06/2014   - [facility] eval with cpst 02/20/2008 exercise testing demonstrateed normal functional capabilities and a normal cardiopulmonary response to exercise. - 09/16/2014  Walked RA x 3 laps @ 185 ft each stopped due to end of study/ nl pace/ no desat  - spirometry 09/16/2014 > no obst/ min restriction       Past Surgical History:  Procedure Laterality Date   ANTERIOR CERVICAL CORPECTOMY N/A 09/15/2012   Procedure: Cervical Three-Four,Cervical Six-Seven Anterior cervical decompression/diskectomy fusion with Cervical Four Corpectomy;  Surgeon: Victory Gens, MD;  Location: [facility];  Service: Neurosurgery;  Laterality: N/A;  Cervical Three-Four,Cervical Six-Seven  Anterior cervical decompression/diskectomy fusion with Cervical four Corpectomy   APPENDECTOMY  12   CERVICAL DISC SURGERY     CHOLECYSTECTOMY N/A 03/29/2017   Procedure: LAPAROSCOPIC CHOLECYSTECTOMY;  Surgeon: Wonda Charlie BRAVO, MD;  Location: [facility];  Service: General;  Laterality: N/A;   COLONOSCOPY WITH PROPOFOL  N/A 10/23/2019   Procedure: COLONOSCOPY WITH PROPOFOL ;  Surgeon: Dessa Reyes ORN, MD;  Location: [facility];  Service:  Endoscopy;  Laterality: N/A;   DECOMPRESSIVE LUMBAR LAMINECTOMY LEVEL 2 Bilateral 05/28/2023   Procedure: DECOMPRESSIVE LUMBAR LAMINECTOMY L2-5;  Surgeon: Claudene Penne ORN, MD;  Location: [facility];  Service: Neurosurgery;  Laterality: Bilateral;   ESOPHAGOGASTRODUODENOSCOPY (EGD) WITH PROPOFOL  N/A 10/23/2019   Procedure: ESOPHAGOGASTRODUODENOSCOPY (EGD) WITH PROPOFOL ;  Surgeon: Dessa Reyes ORN, MD;   Location: [facility];  Service: Endoscopy;  Laterality: N/A;   KNEE SURGERY     bilateral   LUMBAR LAMINECTOMY/DECOMPRESSION MICRODISCECTOMY Right 04/14/2023   Procedure: L4-5 Microdiscectomy;  Surgeon: Claudene Penne ORN, MD;  Location: [facility];  Service: Neurosurgery;  Laterality: Right;   POSTERIOR FUSION CERVICAL SPINE      MEDICATIONS:  Prior to Admission medications  Medication Sig Start Date End Date Taking? Authorizing Provider  carbidopa -levodopa  (SINEMET  CR) 50-200 MG tablet Take 1 tablet by mouth at bedtime.    [provider]  carbidopa -levodopa  (SINEMET  IR) 25-100 MG tablet 1.5 tablets. 06/25/18   [provider]  Cholecalciferol  (VITAMIN D3) 25 MCG (1000 UT) CAPS Take 1 capsule by mouth daily.    [provider]  clonazePAM  (KLONOPIN ) 1 MG tablet Take 1 mg by mouth at bedtime as needed. 04/10/19   [provider]  dexamethasone  (DECADRON ) 4 MG tablet Take 1 tablet (4 mg total) by mouth daily for 5 days. 05/06/24 05/11/24  Amin, Sumayya, MD  DULoxetine  (CYMBALTA ) 60 MG capsule Take 1 capsule (60 mg total) by mouth daily after breakfast. 02/05/23 05/05/24  Marcelino Nurse, MD  furosemide  (LASIX ) 20 MG tablet Take 20 mg by mouth daily.    [provider]  hydrochlorothiazide  (HYDRODIURIL ) 25 MG tablet Take 25 mg by mouth daily. 07/03/18   [provider]  HYDROmorphone  (DILAUDID ) 2 MG tablet Take 1 tablet (2 mg total) by mouth every 6 (six) hours as needed for up to 5 days for severe pain (pain score 7-10). 05/06/24 05/11/24  Amin, Sumayya, MD  lamoTRIgine  (LAMICTAL ) 25 MG tablet Take 75 mg by mouth 2 (two) times daily. Titration schedule 01/24/23 05/05/24  [provider]  losartan  (COZAAR ) 100 MG tablet Take 100 mg by mouth daily. 09/23/19   [provider]  metFORMIN (GLUCOPHAGE) 500 MG tablet Take 1,000 mg by mouth daily with breakfast.    [provider]  methocarbamol  (ROBAXIN ) 500 MG tablet Take 1 tablet (500 mg  total) by mouth every 8 (eight) hours as needed for muscle spasms. 05/06/24   Amin, Sumayya, MD  metoprolol  succinate (TOPROL -XL) 25 MG 24 hr tablet Take 12.5 mg by mouth daily. 08/10/21 05/05/24  [provider]  naloxone  (NARCAN ) nasal spray 4 mg/0.1 mL Place 1 spray into the nose as needed for up to 365 doses (for opioid-induced respiratory depresssion). In case of emergency (overdose), spray once into each nostril. If no response within 3 minutes, repeat application and call 911. 03/02/24 03/01/25  Patel, Seema K, NP  nortriptyline  (PAMELOR ) 25 MG capsule Take 2 capsules (50 mg total) by mouth at bedtime. 10/24/23   Patel, Seema K, NP  omeprazole  (PRILOSEC) 40 MG capsule Take 1 capsule by mouth 2 (two) times daily. 06/16/20   [provider]  pregabalin  (LYRICA ) 100 MG capsule Take 1 capsule (100 mg total) by mouth 3 (three) times daily. 12/03/23 05/31/24  Patel, Seema K, NP  sildenafil (VIAGRA) 100 MG tablet 1/2 to 1 tab daily as needed prior to sex Patient not taking: Reported on 05/05/2024 09/19/21   [provider]  vitamin B-12 (CYANOCOBALAMIN ) 1000 MCG tablet Take 1,000  mcg by mouth daily.    [provider]    Physical Exam   Triage Vital Signs: ED Triage Vitals  Encounter Vitals Group     BP 05/06/24 2102 (!) 160/89     Girls Systolic BP Percentile --      Girls Diastolic BP Percentile --      Boys Systolic BP Percentile --      Boys Diastolic BP Percentile --      Pulse Rate 05/06/24 2102 96     Resp 05/06/24 2102 20     Temp 05/06/24 2102 97.9 F (36.6 C)     Temp Source 05/06/24 2102 Oral     SpO2 05/06/24 2102 99 %     Weight 05/06/24 2058 300 lb (136.1 kg)     Height 05/06/24 2058 6' 3 (1.905 m)     Head Circumference --      Peak Flow --      Pain Score 05/06/24 2058 10     Pain Loc --      Pain Education --      Exclude from Growth Chart --     Most recent vital signs: Vitals:   05/06/24 2102 05/07/24 0128  BP: (!) 160/89 (!) 155/93   Pulse: 96 94  Resp: 20 20  Temp: 97.9 F (36.6 C)   SpO2: 99% 96%    CONSTITUTIONAL: Alert, responds appropriately to questions. Well-appearing; well-nourished HEAD: Normocephalic, atraumatic EYES: Conjunctivae clear, pupils appear equal, sclera nonicteric ENT: normal nose; moist mucous membranes NECK: Supple, normal ROM CARD: RRR; S1 and S2 appreciated RESP: Normal chest excursion without splinting or tachypnea; breath sounds clear and equal bilaterally; no wheezes, no rhonchi, no rales, no hypoxia or respiratory distress, speaking full sentences ABD/GI: Non-distended; soft, non-tender, no rebound, no guarding, no peritoneal signs BACK: The back appears normal EXT: Tender over the anterior lateral right hip without soft tissue swelling, ecchymosis, increased warmth or redness.  He has some mild pain with internal and external rotation.  2+ DP pulse on the right.  Compartments of the right leg are soft.  No tenderness over the knee or ankle.  He is able to stand and ambulate without difficulty. SKIN: Normal color for age and race; warm; no rash on exposed skin NEURO: Moves all extremities equally, normal speech, normal sensation, no saddle anesthesia, no hyperreflexia or clonus PSYCH: The patient's mood and manner are appropriate.   ED Results / Procedures / Treatments   LABS: (all labs ordered are listed, but only abnormal results are displayed) Labs Reviewed - No data to display   EKG:  EKG Interpretation Date/Time:    Ventricular Rate:    PR Interval:    QRS Duration:    QT Interval:    QTC Calculation:   R Axis:      Text Interpretation:           RADIOLOGY: My personal review and interpretation of imaging: MRI shows no sign of septic arthritis.  There is some thinning of the labrum.  He has associated degenerative changes.  I have personally reviewed all radiology reports.   No results found.   PROCEDURES:  Critical Care performed:  No     Procedures    IMPRESSION / MDM / ASSESSMENT AND PLAN / ED COURSE  I reviewed the triage vital signs and the nursing notes.    Patient here with back pain, hip pain.  Was just discharged from the hospital earlier today for the same.  DIFFERENTIAL DIAGNOSIS (includes but not limited to):   Acute on chronic back pain, degenerative disc disease, arthritis of the right hip, labral tear, tenderness, bursitis, doubt septic arthritis, doubt cauda equina, epidural abscess or hematoma, discitis or osteomyelitis, fracture   Patient's presentation is most consistent with acute complicated illness / injury requiring diagnostic workup.   PLAN: I have reached back out to Dr. Claudene with neurosurgery regarding this patient.  He states that he is concerned that his symptoms today could be coming from the hip and recommends obtaining an MRI.  Low suspicion for infectious etiology therefore does not need to be done with contrast.  Lab work done yesterday reassuring.  No indication to repeat this.  Afebrile here.  No focal neurodeficits.  He just had an MRI of his lumbar spine that showed no acute neurosurgical emergency.  He was discharged with a prescription of Dilaudid  and states he is on steroids.  He has pain management specialist that is following him as well.   MEDICATIONS GIVEN IN ED: Medications  LORazepam  (ATIVAN ) injection 1 mg (1 mg Intravenous Given 05/07/24 0128)  ketorolac  (TORADOL ) 30 MG/ML injection 30 mg (30 mg Intravenous Given 05/07/24 0127)  HYDROmorphone  (DILAUDID ) injection 1 mg (1 mg Intravenous Given 05/07/24 0128)  ondansetron  (ZOFRAN ) injection 4 mg (4 mg Intravenous Given 05/07/24 0128)  HYDROmorphone  (DILAUDID ) injection 1 mg (1 mg Intravenous Given 05/07/24 0533)     ED COURSE: Patient's pain seems to be well-controlled here with IV Dilaudid .  He appears comfortable and is ambulatory.  Preliminary read on his MRI of his hip shows no significant abnormality.  Discussed  with patient that they will not be formally read until later today and he can follow-up on these results in MyChart.  Will give him outpatient orthopedic follow-up information.  Recommend that he follow-up with his PCP and pain management specialist for further pain management.  He verbalized understanding and is comfortable with this plan.  Currently awaiting a ride home.  On review of his discharge summary it looks like he was switched to Dilaudid  and Robaxin  from his normal Norco and tizanidine .  He was also discharged with Decadron .   At this time, I do not feel there is any life-threatening condition present. I reviewed all nursing notes, vitals, pertinent previous records.  All lab and urine results, EKGs, imaging ordered have been independently reviewed and interpreted by myself.  I reviewed all available radiology reports from any imaging ordered this visit.  Based on my assessment, I feel the patient is safe to be discharged home without further emergent workup and can continue workup as an outpatient as needed. Discussed all findings, treatment plan as well as usual and customary return precautions.  They verbalize understanding and are comfortable with this plan.  Outpatient follow-up has been provided as needed.  All questions have been answered.    CONSULTS: Case discussed with Dr. Penne Claudene with neurosurgery.   OUTSIDE RECORDS REVIEWED: Reviewed recent admission notes.       FINAL CLINICAL IMPRESSION(S) / ED DIAGNOSES   Final diagnoses:  Right hip pain     Rx / DC Orders   ED Discharge Orders     None        Note:  This document was prepared using Dragon voice recognition software and may include unintentional dictation errors.   Eulene Pekar, Josette SAILOR, DO 05/07/24 [phone]  "

## 2024-05-22 ENCOUNTER — Ambulatory Visit: Admitting: Neurosurgery

## 2024-05-28 ENCOUNTER — Encounter: Admitting: Nurse Practitioner
